# Patient Record
Sex: Female | Born: 1955 | Race: White | Hispanic: No | Marital: Married
Health system: Midwestern US, Community
[De-identification: ages and names within clinical notes are randomized; demographics above are authoritative.]

## PROBLEM LIST (undated history)

## (undated) DIAGNOSIS — F0390 Unspecified dementia without behavioral disturbance: Secondary | ICD-10-CM

## (undated) DIAGNOSIS — K219 Gastro-esophageal reflux disease without esophagitis: Secondary | ICD-10-CM

## (undated) DIAGNOSIS — R519 Headache, unspecified: Secondary | ICD-10-CM

## (undated) DIAGNOSIS — G8929 Other chronic pain: Secondary | ICD-10-CM

## (undated) DIAGNOSIS — I639 Cerebral infarction, unspecified: Secondary | ICD-10-CM

## (undated) DIAGNOSIS — F419 Anxiety disorder, unspecified: Secondary | ICD-10-CM

## (undated) DIAGNOSIS — M1990 Unspecified osteoarthritis, unspecified site: Secondary | ICD-10-CM

## (undated) DIAGNOSIS — M545 Low back pain, unspecified: Secondary | ICD-10-CM

## (undated) DIAGNOSIS — G459 Transient cerebral ischemic attack, unspecified: Secondary | ICD-10-CM

## (undated) DIAGNOSIS — F32A Depression, unspecified: Secondary | ICD-10-CM

## (undated) DIAGNOSIS — R569 Unspecified convulsions: Secondary | ICD-10-CM

## (undated) DIAGNOSIS — F329 Major depressive disorder, single episode, unspecified: Secondary | ICD-10-CM

## (undated) DIAGNOSIS — R51 Headache: Secondary | ICD-10-CM

## (undated) DIAGNOSIS — I10 Essential (primary) hypertension: Secondary | ICD-10-CM

## (undated) DIAGNOSIS — E785 Hyperlipidemia, unspecified: Secondary | ICD-10-CM

## (undated) DIAGNOSIS — F0280 Dementia in other diseases classified elsewhere without behavioral disturbance: Secondary | ICD-10-CM

## (undated) HISTORY — DX: Transient cerebral ischemic attack, unspecified: G45.9

## (undated) HISTORY — PX: VAGINAL HYSTERECTOMY: SUR661

## (undated) HISTORY — PX: TUBAL LIGATION: SHX77

## (undated) HISTORY — DX: Hyperlipidemia, unspecified: E78.5

## (undated) HISTORY — DX: Cerebral infarction, unspecified: I63.9

## (undated) HISTORY — PX: FRACTURE SURGERY: SHX138

## (undated) HISTORY — PX: DILATION AND CURETTAGE OF UTERUS: SHX78

---

## 2004-01-20 ENCOUNTER — Other Ambulatory Visit: Payer: Self-pay

## 2004-01-21 ENCOUNTER — Inpatient Hospital Stay: Payer: Self-pay

## 2004-01-25 ENCOUNTER — Other Ambulatory Visit: Payer: Self-pay

## 2004-01-25 ENCOUNTER — Emergency Department: Payer: Self-pay | Admitting: General Practice

## 2004-01-26 ENCOUNTER — Ambulatory Visit: Payer: Self-pay | Admitting: Obstetrics and Gynecology

## 2004-01-28 ENCOUNTER — Ambulatory Visit: Payer: Self-pay | Admitting: *Deleted

## 2004-06-17 ENCOUNTER — Emergency Department: Payer: Self-pay | Admitting: Emergency Medicine

## 2005-09-03 ENCOUNTER — Emergency Department: Payer: Self-pay | Admitting: Emergency Medicine

## 2006-04-28 ENCOUNTER — Emergency Department: Payer: Self-pay | Admitting: Emergency Medicine

## 2009-07-11 ENCOUNTER — Emergency Department: Payer: Self-pay | Admitting: Emergency Medicine

## 2009-12-13 ENCOUNTER — Emergency Department: Payer: Self-pay | Admitting: Unknown Physician Specialty

## 2011-07-04 ENCOUNTER — Emergency Department: Payer: Self-pay | Admitting: Emergency Medicine

## 2011-07-04 LAB — CBC
HCT: 42 % (ref 35.0–47.0)
MCHC: 33.2 g/dL (ref 32.0–36.0)

## 2011-07-04 LAB — COMPREHENSIVE METABOLIC PANEL
Albumin: 4.1 g/dL (ref 3.4–5.0)
Alkaline Phosphatase: 80 U/L (ref 50–136)
Anion Gap: 8 (ref 7–16)
Bilirubin,Total: 0.4 mg/dL (ref 0.2–1.0)
Calcium, Total: 9.3 mg/dL (ref 8.5–10.1)
Chloride: 108 mmol/L — ABNORMAL HIGH (ref 98–107)
EGFR (Non-African Amer.): >60
Glucose: 94 mg/dL (ref 65–99)
Potassium: 3.8 mmol/L (ref 3.5–5.1)
Sodium: 143 mmol/L (ref 136–145)
Total Protein: 8 g/dL (ref 6.4–8.2)

## 2011-07-04 LAB — TROPONIN I: Troponin-I: <0.02 ng/mL

## 2011-07-04 LAB — PROTIME-INR
INR: 0.9
Prothrombin Time: 12.9 secs (ref 11.5–14.7)

## 2011-07-04 LAB — URINALYSIS, COMPLETE
Bilirubin,UR: NEGATIVE
Blood: NEGATIVE
Glucose,UR: NEGATIVE mg/dL (ref 0–75)
Ketone: NEGATIVE
Protein: 30
RBC,UR: 4 /HPF (ref 0–5)
Specific Gravity: 1.032 (ref 1.003–1.030)
WBC UR: 4 /HPF (ref 0–5)

## 2011-07-04 LAB — LIPASE, BLOOD: Lipase: 128 U/L (ref 73–393)

## 2014-05-27 ENCOUNTER — Emergency Department
Admit: 2014-05-27 | Disposition: A | Discharge status: Home / Self Care | Payer: Self-pay | Admitting: Emergency Medicine

## 2014-05-27 LAB — URINALYSIS, COMPLETE
BILIRUBIN, UR: NEGATIVE
Bacteria: NONE SEEN
Blood: NEGATIVE
GLUCOSE, UR: NEGATIVE mg/dL (ref 0–75)
Ketone: NEGATIVE
NITRITE: NEGATIVE
Ph: 6 (ref 4.5–8.0)
Protein: NEGATIVE
RBC,UR: 1 /HPF (ref 0–5)
SPECIFIC GRAVITY: 1.055 (ref 1.003–1.030)
Squamous Epithelial: 1

## 2014-05-27 LAB — CBC WITH DIFFERENTIAL/PLATELET
Basophil #: 0 10*3/uL
Basophil %: 0.6 %
Eosinophil #: 0.1 10*3/uL
Eosinophil %: 1.1 %
HCT: 42.7 %
HGB: 13.8 g/dL
Lymphocyte %: 49.9 %
Lymphs Abs: 3.3 10*3/uL
MCH: 28.1 pg
MCHC: 32.2 g/dL
MCV: 87 fL
Monocyte #: 0.5 10*3/uL
Monocyte %: 7.7 %
Neutrophil #: 2.7 10*3/uL
Neutrophil %: 40.7 %
Platelet: 171 10*3/uL
RBC: 4.9 X10 6/mm 3
RDW: 15.1 % — ABNORMAL HIGH
WBC: 6.6 10*3/uL

## 2014-05-27 LAB — COMPREHENSIVE METABOLIC PANEL
ALT: 24 U/L
Albumin: 4.4 g/dL
Alkaline Phosphatase: 77 U/L
Anion Gap: 6 — ABNORMAL LOW (ref 7–16)
BUN: 18 mg/dL
Bilirubin,Total: 0.4 mg/dL
CALCIUM: 9.2 mg/dL
CHLORIDE: 108 mmol/L
Co2: 26 mmol/L
Creatinine: 0.57 mg/dL
EGFR (African American): >60
EGFR (Non-African Amer.): >60
Glucose: 107 mg/dL — ABNORMAL HIGH
POTASSIUM: 3.8 mmol/L
SGOT(AST): 22 U/L
SODIUM: 140 mmol/L
Total Protein: 7.9 g/dL

## 2014-05-27 LAB — CK TOTAL AND CKMB (NOT AT ARMC)
CK, Total: 144 U/L
CK-MB: 2.7 ng/mL

## 2014-05-27 LAB — TROPONIN I: Troponin-I: <0.03 ng/mL

## 2014-06-09 LAB — BASIC METABOLIC PANEL
Anion Gap: 14 (ref 7–16)
BUN: 31 mg/dL — AB
CALCIUM: 9.9 mg/dL
Chloride: 96 mmol/L — ABNORMAL LOW
Co2: 29 mmol/L
Creatinine: 2.44 mg/dL — ABNORMAL HIGH
EGFR (African American): 25 — ABNORMAL LOW
EGFR (Non-African Amer.): 21 — ABNORMAL LOW
Glucose: 146 mg/dL — ABNORMAL HIGH
Potassium: 3.2 mmol/L — ABNORMAL LOW
SODIUM: 139 mmol/L

## 2014-06-09 LAB — CBC WITH DIFFERENTIAL/PLATELET
Basophil #: 0.1 10*3/uL (ref 0.0–0.1)
Basophil %: 0.6 %
EOS ABS: 0 10*3/uL (ref 0.0–0.7)
Eosinophil %: 0.1 %
HCT: 42.2 % (ref 35.0–47.0)
HGB: 14.2 g/dL (ref 12.0–16.0)
LYMPHS PCT: 22.3 %
Lymphocyte #: 3.5 10*3/uL (ref 1.0–3.6)
MCH: 28.8 pg (ref 26.0–34.0)
MCHC: 33.5 g/dL (ref 32.0–36.0)
MCV: 86 fL (ref 80–100)
Monocyte #: 1.1 x10 3/mm — ABNORMAL HIGH (ref 0.2–0.9)
Monocyte %: 7.1 %
NEUTROS ABS: 10.9 10*3/uL — AB (ref 1.4–6.5)
Neutrophil %: 69.9 %
Platelet: 188 10*3/uL (ref 150–440)
RBC: 4.91 10*6/uL (ref 3.80–5.20)
RDW: 14.9 % — ABNORMAL HIGH (ref 11.5–14.5)
WBC: 15.6 10*3/uL — ABNORMAL HIGH (ref 3.6–11.0)

## 2014-06-10 ENCOUNTER — Inpatient Hospital Stay
Admit: 2014-06-10 | Disposition: A | Discharge status: Home / Self Care | Payer: Self-pay | Attending: Internal Medicine | Admitting: Internal Medicine

## 2014-06-10 LAB — TROPONIN I: Troponin-I: <0.03 ng/mL

## 2014-06-10 LAB — BASIC METABOLIC PANEL
Anion Gap: 8 (ref 7–16)
BUN: 21 mg/dL — ABNORMAL HIGH
CALCIUM: 8.8 mg/dL — AB
CO2: 27 mmol/L
Chloride: 104 mmol/L
Creatinine: 0.82 mg/dL
GLUCOSE: 100 mg/dL — AB
Potassium: 3.1 mmol/L — ABNORMAL LOW
SODIUM: 139 mmol/L

## 2014-06-10 LAB — URINALYSIS, COMPLETE
BILIRUBIN, UR: NEGATIVE
Glucose,UR: NEGATIVE mg/dL (ref 0–75)
KETONE: NEGATIVE
Nitrite: NEGATIVE
PH: 5 (ref 4.5–8.0)
PROTEIN: NEGATIVE
SPECIFIC GRAVITY: 1.005 (ref 1.003–1.030)

## 2014-06-27 NOTE — H&P (Signed)
PATIENT NAME:  Sandra Charles, FIFIELD MR#:  [id number] DATE OF BIRTH:  August 01, 1955  DATE OF ADMISSION:  06/10/2014  REFERRING PHYSICIAN:  Valli Glance. Owens Shark, MD   PRIMARY CARE PRACTITIONER:  Simeon Craft, MD, of [facility]  ADMITTING PHYSICIAN:  Juluis Mire, MD  CHIEF COMPLAINT:  Dizziness with near syncopal episode witnessed by her friend x 2.   HISTORY OF PRESENT ILLNESS:  A 59 year old African-American female with a history of hypertension and anxiety/depression disorder, who presents to the Emergency Room with the complaints of ongoing dizziness with lightheadedness. The patient states that she has been having blood pressure fluctuations for the past few weeks and for which she was seen by her primary care practitioner last Friday and was added on a new blood pressure medication, which is lisinopril/hydrochlorothiazide in place of hydrochlorothiazide. Ever since taking that medication, the patient has been not feeling well with having dizziness with some cramps and today this dizziness increased, and while she was walking within the home this evening, she felt lightheadedness and almost passed out, but did not lose consciousness, which was witnessed by her friend. The patient also stated that she checked her blood pressure prior to the event and found her blood pressure to be low with a blood pressure of 99/52. Because of this presyncopal episode, the patient was brought to the Emergency Room for further evaluation.   In the Emergency Room on arrival, the patient was noted to have a blood pressure of 107/56 and workup revealed acute kidney injury with a creatinine of 2.44 and a potassium of 3.2 and a white blood cell count elevation of 15.6, and an abnormal urinalysis suggestive of UTI. EKG was unremarkable. The patient received 1 liter of IV fluids with normal saline, following which her symptoms improved and she started feeling better. Urine was sent for cultures, and she was started on Rocephin  by the ED physician and hospitalist service was consulted for further management.   The patient denies any history of chest pain, shortness of breath, cough, or fever prior to the event. No nausea, vomiting, diarrhea, constipation, or abdominal pain. Denies any dysuria, frequency, or urgency. No focal weakness or numbness. As mentioned, the patient stated that she has been having some dizziness with lightheadedness for the past few days following change in her blood pressure medications, which was done by her PCP last Friday. The patient had an ED visit at [facility] on 05/27/2014, for blood pressure issues. At that time, her labs revealed a creatinine which was in the normal range.    PAST MEDICAL HISTORY: 1.  Hypertension.  2.  Anxiety/depression.   PAST SURGICAL HISTORY:  Hysterectomy.   ALLERGIES:  PENICILLIN.   HOME MEDICATIONS: 1.  Amitriptyline 50 mg 1 tablet orally at bedtime.  2.  Amlodipine 10 mg 1 tablet orally once a day.  3.  Atenolol 50 mg 1 tablet orally once a day.  4.  Fluoxetine 20 mg 1 capsule orally once a day.  5.  Hydrochlorothiazide 25 mg 1 tablet orally once a day.  6.  Lorazepam 0.5 mg 1 tablet orally once at bedtime.  7.  Lisinopril/hydrochlorothiazide 20/25 mg 1 tablet orally once a day.   FAMILY HISTORY:  Significant for mother and father both with hypertension, sister with heart disease, and brother with diabetes mellitus.   SOCIAL HISTORY:  She is single and lives at home. She works in the [employer]. Denies any history of smoking, alcohol, or substance abuse.  REVIEW OF SYSTEMS: CONSTITUTIONAL:  Negative for fever or chills. She does have ongoing dizziness with lightheadedness for the past few days as mentioned in the history of present illness.   EYES:  Negative for blurred vision or double vision. No pain. No redness. No discharge.  EARS, NOSE, AND THROAT:  Negative for tinnitus, ear pain, hearing loss, epistaxis, nasal  discharge, or difficulty swallowing.  RESPIRATORY:  Negative for cough, wheezing, dyspnea, hemoptysis, or painful respiration.  CARDIOVASCULAR:  Negative for chest pain, palpitations, orthopnea, dyspnea on exertion, or pedal edema. Positive for dizziness with presyncopal episodes x 2 this evening as noted in the history of present illness.  GASTROINTESTINAL:  Negative for nausea, vomiting, diarrhea, constipation, abdominal pain, hematemesis, or melena.  GENITOURINARY:  Negative for dysuria, frequency, urgency, or hematuria.  ENDOCRINE:  Negative for polyuria, nocturia, or heat or cold intolerance.  HEMATOLOGIC AND LYMPHATIC:  Negative for anemia, easy bruising or bleeding, or swollen glands.  INTEGUMENTARY:  Negative for acne, skin rash, or lesions.  MUSCULOSKELETAL:  Negative for neck or back pain. No history of arthritis or gout.  NEUROLOGICAL:  Negative for focal weakness or numbness. No history of CVA, TIA, or seizure disorder.  PSYCHIATRIC:  Positive for history of anxiety/depression, stable on home medications.   PHYSICAL EXAMINATION: VITAL SIGNS:  Temperature 98.2 degrees Fahrenheit, pulse rate 73 per minute, respirations 20 per minute, blood pressure on arrival 107/52, current blood pressure 129/61, oxygen saturation 99% on room air.  GENERAL:  Well developed, well nourished, alert, oriented, in no acute distress, comfortably resting in the bed.  HEAD:  Atraumatic, normocephalic.  EYES:  Pupils are equal and react to light and accommodation. No conjunctival pallor. No icterus. Extraocular muscles are intact.   NOSE:  No drainage.  EARS:  No drainage.  ORAL CAVITY:  No mucosal lesions.  NECK:  Supple. No JVD. No thyromegaly. No carotid bruits. Range of motion of the neck is within normal limits.  RESPIRATORY:  Good respiratory effort. Not using accessory muscles of respiration. Bilateral vesicular breath sounds. No rales or rhonchi.  CARDIOVASCULAR:  S1 and S2, regular. No murmurs,  gallops, or clicks. Pulses are equal at carotid, femoral, and pedal pulses. No peripheral edema.  GASTROINTESTINAL:  Abdomen is soft and nontender. No hepatosplenomegaly. No masses. No rigidity. No guarding. Bowel sounds are present and equal in all 4 quadrants.  GENITOURINARY:  Deferred. MUSCULOSKELETAL:  No joint tenderness or effusion. Range of motion is adequate. Strength and tone are equal bilaterally.  SKIN:  Inspection within normal limits. No obvious wounds.  LYMPHATIC:  No cervical lymphadenopathy.  VASCULAR:  Good dorsalis pedis and posterior tibial pulses.  NEUROLOGICAL:  Alert, awake, and oriented x 3. Cranial nerves II through XII are grossly intact. No sensory deficit. Motor strength is 5/5 in both upper and lower extremities. DTR's are 2+ bilaterally and symmetrical. Plantars are downgoing.  PSYCHIATRIC:  Alert, awake, and oriented x 3. Judgment and insight are adequate. Memory and mood are within normal limits.   ANCILLARY DATA:   LABORATORY DATA:  Serum glucose is 146, BUN 31, creatinine 2.44, sodium 139, potassium 3.2, chloride 96, bicarbonate 29, and total calcium is 9.9. Troponin is less than 0.03. WBC is 15.6, hemoglobin 14.2, hematocrit 42.2, and platelet count 188,000. Urinalysis:  Hazy color, 1+ blood, nitrite negative, leukocyte esterase 2+, WBCs 6 to 30, bacteria rare.   CHEST X-RAY:  No cardiomegaly. No edema, consolidation, effusion, or pneumothorax. No active cardiopulmonary disease.   EKG:  Normal sinus  rhythm with ventricular rate of 67 beats per minute and nonspecific T wave changes.   ASSESSMENT AND PLAN:  A 59 year old African-American female with a history of hypertension and anxiety/depression, who presents with dizziness and lightheadedness, found to have acute kidney injury and borderline low blood pressure and abnormal urinalysis suggestive of urinary tract infection.   1.  Postural dizziness with presyncope. Recent change in blood pressure medications by  her primary care practitioner and was found to have low blood pressure at home prior to the event. Workup reveals a normal EKG and troponin x 1 negative. The postural dizziness is likely secondary to low blood pressure secondary due to medication change. Plan:  We will admit with telemetry monitoring. We will hold off on lisinopril/hydrochlorothiazide for now. Cycle cardiac enzymes. Check orthostatic vitals and consider further workup if the patient is symptomatic.  2.  Acute kidney injury. Creatinine on the visit of 05/27/2014, was within normal limits. Creatinine now is 2.44. The patient was started on lisinopril/hydrochlorothiazide recently by her primary care practitioner. The likely cause of AKI is new medication, which is ACE inhibitor. Plan:  We will hold off on lisinopril/hydrochlorothiazide for now and IV hydration. Follow BMP. Consider further workup including nephrology consultation if no improvement.  3.  Urinary tract infection. Plan:  Continue Rocephin. Follow up urine cultures.  4.  Hypertension. The patient is on multiple medications. Discontinue lisinopril/hydrochlorothiazide because of low blood pressure and acute kidney injury. Continue amlodipine and atenolol for now and monitor blood pressure closely.  5.  Hypokalemia, mild. Potassium was replaced in the ED. Follow BMP.  6.  History of generalized anxiety disorder, stable on home medications. Continue same.  7.  Deep vein thrombosis prophylaxis. Subcutaneous heparin.  8.  Gastrointestinal prophylaxis. Proton pump inhibitor.   CODE STATUS:  Full code.   TIME SPENT:  50 minutes.    ____________________________ Juluis Mire, MD enr:nb D: 06/10/2014 02:49:47 ET T: 06/10/2014 03:30:22 ET JOB#: [id number]  cc: Juluis Mire, MD, <Dictator> Simeon Craft, MD  Juluis Mire MD ELECTRONICALLY SIGNED 06/10/2014 14:13

## 2014-06-27 NOTE — Discharge Summary (Signed)
PATIENT NAME:  Sandra Charles, Sandra Charles MR#:  [id number] DATE OF BIRTH:  06/15/1955  DATE OF ADMISSION:  06/10/2014 DATE OF DISCHARGE:  06/10/2014  DISCHARGE DIAGNOSES:  1. Urinary tract infection.  2. Hypotension secondary to antihypertensive medications.  3. Hypertension.  4. Acute renal failure.  5. Dehydration.   DISCHARGE MEDICATIONS:  1. Lorazepam 0.5 mg oral once a day.  2. Amitriptyline 50 mg oral once a day.  3. Amlodipine 10 mg oral once a day.  4. Atenolol 50 mg oral once a day.  5. Fluoxetine 20 mg daily.  6. Hydrochlorothiazide 25 mg daily.  7. Potassium chloride 20 mEq daily.   DISCHARGE INSTRUCTIONS: Low-sodium diet. Activity as tolerated. Check blood pressure daily, keep a log book, and take to your doctor's office, and follow up with PCP at [facility] in 1 week.   IMAGING STUDIES: Include a chest x-ray portable which showed nothing acute.   ADMITTING HISTORY AND PHYSICAL AND HOSPITAL COURSE: Please see detailed H and P dictated previously. In brief a 59 year old female patient with history of hypertension who was recently started on a combination of lisinopril and hydrochlorothiazide, presented to the hospital complaining of lightheadedness, presyncope, was found to have acute renal failure.   HOSPITAL COURSE:  1.  Hypotension. This was secondary to starting new blood pressure medication with lisinopril.  The patient also had acute renal failure secondary to ATN from hypotension. Also had some component of dehydration, was aggressively rehydrated with which her creatinine has trended down to 0.8 from 2.4. I have stopped her lisinopril. We will continue her beta blocker, Norvasc, and hydrochlorothiazide.  Have her follow up with her primary care physician. The patient has been given above-noted discharge.   2.  Prior to discharge the patient has ambulated in the hallway. S1, S2 heard. Lungs clear. No edema found.  3.  UTI.  The patient also had mild UTI for which she has been given  Cipro at discharge.   TIME SPENT ON DAY OF DISCHARGE IN DISCHARGE ACTIVITY: 35 minutes.     ____________________________ Leia Alf Mina Babula, MD srs:bu D: 06/11/2014 15:15:42 ET T: 06/11/2014 15:39:22 ET JOB#: [id number]  cc: Alveta Heimlich R. Wilder Kurowski, MD, <Dictator> Neita Carp MD ELECTRONICALLY SIGNED 06/21/2014 11:18

## 2014-07-19 ENCOUNTER — Emergency Department
Admission: EM | Admit: 2014-07-19 | Discharge: 2014-07-19 | Disposition: A | Discharge status: Home / Self Care | Payer: Medicaid Other | Attending: Emergency Medicine | Admitting: Emergency Medicine

## 2014-07-19 ENCOUNTER — Encounter: Payer: Self-pay | Admitting: Emergency Medicine

## 2014-07-19 DIAGNOSIS — Z87891 Personal history of nicotine dependence: Secondary | ICD-10-CM | POA: Insufficient documentation

## 2014-07-19 DIAGNOSIS — N72 Inflammatory disease of cervix uteri: Secondary | ICD-10-CM | POA: Diagnosis not present

## 2014-07-19 DIAGNOSIS — I10 Essential (primary) hypertension: Secondary | ICD-10-CM | POA: Diagnosis not present

## 2014-07-19 DIAGNOSIS — R350 Frequency of micturition: Secondary | ICD-10-CM | POA: Diagnosis present

## 2014-07-19 HISTORY — DX: Essential (primary) hypertension: I10

## 2014-07-19 LAB — WET PREP, GENITAL
CLUE CELLS WET PREP: NONE SEEN
Trich, Wet Prep: NONE SEEN
Yeast Wet Prep HPF POC: NONE SEEN

## 2014-07-19 LAB — CHLAMYDIA/NGC RT PCR (ARMC ONLY)
Chlamydia Tr: NOT DETECTED
N gonorrhoeae: NOT DETECTED

## 2014-07-19 LAB — URINALYSIS COMPLETE WITH MICROSCOPIC (ARMC ONLY)
BACTERIA UA: NONE SEEN
Bilirubin Urine: NEGATIVE
GLUCOSE, UA: NEGATIVE mg/dL
HGB URINE DIPSTICK: NEGATIVE
KETONES UR: NEGATIVE mg/dL
Leukocytes, UA: NEGATIVE
Nitrite: NEGATIVE
PH: 6 (ref 5.0–8.0)
PROTEIN: NEGATIVE mg/dL
Specific Gravity, Urine: 1.015 (ref 1.005–1.030)

## 2014-07-19 LAB — GLUCOSE, CAPILLARY: Glucose-Capillary: 84 mg/dL (ref 65–99)

## 2014-07-19 MED ORDER — LIDOCAINE HCL (PF) 1 % IJ SOLN
INTRAMUSCULAR | Status: AC
  Administered 2014-07-19: 5 mL
  Filled 2014-07-19: qty 5

## 2014-07-19 MED ORDER — AZITHROMYCIN 250 MG PO TABS
1000.0000 mg | ORAL_TABLET | Freq: Once | ORAL | Status: AC
  Administered 2014-07-19: 1000 mg via ORAL

## 2014-07-19 MED ORDER — CEFTRIAXONE SODIUM 250 MG IJ SOLR
INTRAMUSCULAR | Status: AC
  Administered 2014-07-19: 250 mg via INTRAMUSCULAR
  Filled 2014-07-19: qty 250

## 2014-07-19 MED ORDER — CEFTRIAXONE SODIUM 250 MG IJ SOLR
250.0000 mg | Freq: Once | INTRAMUSCULAR | Status: AC
  Administered 2014-07-19: 250 mg via INTRAMUSCULAR

## 2014-07-19 MED ORDER — AZITHROMYCIN 250 MG PO TABS
ORAL_TABLET | ORAL | Status: AC
  Administered 2014-07-19: 1000 mg via ORAL
  Filled 2014-07-19: qty 4

## 2014-07-19 NOTE — ED Notes (Signed)
Was on meds for uti, now still has urinary freq and vagina rawness

## 2014-07-19 NOTE — ED Provider Notes (Signed)
[facility] Emergency Department Provider Note  ____________________________________________  Time seen: 1400  I have reviewed the triage vital signs and the nursing notes.   HISTORY  Chief Complaint Urinary Frequency  HPI Sandra Charles is a 59 y.o. female this day with complaint of vaginal "rawness" she states she is having urinary frequency also. This has been going on for approximately 1 month. She denies any vaginal discharge or itching.She rates her pain as 10 out of 10. Nothing has improved her pain and she has not tried any medication for this.   Past Medical History  Diagnosis Date  . Hypertension     There are no active problems to display for this patient.   No past surgical history on file.  No current outpatient prescriptions on file.  Allergies Review of patient's allergies indicates no known allergies.  No family history on file.  Social History History  Substance Use Topics  . Smoking status: Former Research scientist (life sciences)  . Smokeless tobacco: Not on file  . Alcohol Use: No    Review of Systems Constitutional: No fever/chills Eyes: No visual changes. ENT: No sore throat. Cardiovascular: Denies chest pain. Respiratory: Denies shortness of breath. Gastrointestinal: No abdominal pain.  No nausea, no vomiting.  No diarrhea.  No constipation. Genitourinary: Positive for dysuria. Musculoskeletal: Negative for back pain. Skin: Negative for rash. Neurological: Negative for headaches, focal weakness or numbness.  10-point ROS otherwise negative.  ____________________________________________   PHYSICAL EXAM:  VITAL SIGNS: ED Triage Vitals  Enc Vitals Group     BP 07/19/14 1203 192/75 mmHg     Pulse Rate 07/19/14 1203 72     Resp 07/19/14 1203 18     Temp 07/19/14 1203 98.2 F (36.8 C)     Temp Source 07/19/14 1203 Oral     SpO2 07/19/14 1203 100 %     Weight 07/19/14 1203 155 lb (70.308 kg)     Height 07/19/14 1203 5\' 1"  (1.549 m)     Head Cir --      Peak Flow --      Pain Score 07/19/14 1204 10     Pain Loc --      Pain Edu? --      Excl. in [place]? --     Constitutional: Alert and oriented. Well appearing and in no acute distress. Eyes: Conjunctivae are normal. PERRL. EOMI. Head: Atraumatic. Neck: No stridor, supple Cardiovascular: Normal rate, regular rhythm. Grossly normal heart sounds.  Good peripheral circulation. Respiratory: Normal respiratory effort.  No retractions. Lungs CTAB. Gastrointestinal: Soft and nontender. No distention. No abdominal bruits. No CVA tenderness. Genitourinary: Pelvic exam was done. There is no abnormality to the external genitalia. There is minimal discharge noted. Cervix is slightly irritated. There is positive adnexal tenderness but no masses bilaterally.  there is some minimal cervical motion tenderness. Musculoskeletal: No lower extremity tenderness nor edema.  No joint effusions. Neurologic:  Normal speech and language. No gross focal neurologic deficits are appreciated. Speech is normal. No gait instability. Skin:  Skin is warm, dry and intact. No rash noted. Psychiatric: Mood and affect are normal. Speech and behavior are normal.  ____________________________________________   LABS (all labs ordered are listed, but only abnormal results are displayed)  Labs Reviewed  WET PREP, GENITAL - Abnormal; Notable for the following:    WBC, Wet Prep HPF POC FEW (*)    All other components within normal limits  URINALYSIS COMPLETEWITH MICROSCOPIC ([facility])  - Abnormal; Notable for the following:  Color, Urine STRAW (*)    APPearance CLEAR (*)    Squamous Epithelial / LPF 0-5 (*)    All other components within normal limits  CHLAMYDIA/NGC RT PCR ([facility])   GLUCOSE, CAPILLARY  CBG MONITORING, ED    PROCEDURES  Procedure(s) performed: None  Critical Care performed: No  ____________________________________________   INITIAL IMPRESSION / ASSESSMENT AND PLAN /  ED COURSE  Pertinent labs & imaging results that were available during my care of the patient were reviewed by me and considered in my medical decision making (see chart for details).  Patient was made aware of her wet prep. Patient received IM dose of Rocephin along with Zithromax in the emergency room. She is to abstain from sexual activity for one week. ____________________________________________   FINAL CLINICAL IMPRESSION(S) / ED DIAGNOSES  Final diagnoses:  Cervicitis, acute, non-specific      Johnn Hai, PA-C 07/19/14 1932  Lisa Roca, MD 07/21/14 [phone]

## 2014-07-19 NOTE — ED Notes (Signed)
States perineal burning and urinary frequency x 1 month, states took med for UTI, not sure if still has UTI or has vaginal irritation

## 2014-07-19 NOTE — Discharge Instructions (Signed)
Cervicitis Cervicitis is a soreness and puffiness (inflammation) of the cervix.  HOME CARE  Do not have sex (intercourse) until your doctor says it is okay.  Do not have sex until your partner is treated or as told by your doctor.  Take your antibiotic medicine as told. Finish it even if you start to feel better. GET HELP IF:   Your symptoms that brought you to the doctor come back.  You have a fever. MAKE SURE YOU:   Understand these instructions.  Will watch your condition.  Will get help right away if you are not doing well or get worse. Document Released: 11/22/2007 Document Revised: 02/17/2013 Document Reviewed: 08/06/2012 [facility] Patient Information 2015 [place], [place]. This information is not intended to replace advice given to you by your health care provider. Make sure you discuss any questions you have with your health care provider.

## 2014-09-08 ENCOUNTER — Ambulatory Visit
Admission: RE | Admit: 2014-09-08 | Discharge: 2014-09-08 | Disposition: A | Discharge status: Home / Self Care | Payer: Self-pay | Source: Ambulatory Visit | Attending: Oncology | Admitting: Oncology

## 2014-09-08 ENCOUNTER — Ambulatory Visit: Payer: Self-pay | Attending: Oncology

## 2014-09-08 VITALS — BP 153/82 | HR 78 | Temp 98.2°F | Resp 18 | Ht 62.21 in | Wt 163.8 lb

## 2014-09-08 DIAGNOSIS — Z0000 Encounter for general adult medical examination without abnormal findings: Secondary | ICD-10-CM

## 2014-09-08 NOTE — Progress Notes (Signed)
Subjective:     Patient ID: Sandra Charles, female   DOB: 27-Nov-1955, 59 y.o.   MRN: [id number]  HPI   Review of Systems     Objective:   Physical Exam  Pulmonary/Chest: Right breast exhibits no inverted nipple, no mass, no nipple discharge, no skin change and no tenderness. Left breast exhibits no inverted nipple, no mass, no nipple discharge, no skin change and no tenderness. Breasts are symmetrical.  Genitourinary: No labial fusion. There is no rash, tenderness, lesion or injury on the right labia. There is no rash, tenderness, lesion or injury on the left labia. No erythema, tenderness or bleeding in the vagina. No foreign body around the vagina. No signs of injury around the vagina. No vaginal discharge found.  Total hysterectomy       Assessment:     [place] visitPatient screened, and meets [facility] eligibility.  Patient does not have insurance, Medicare or Medicaid.  Handout given on Affordable Care Act.Patient screened, and meets BCCCP eligibility.  Patient does not have insurance, Medicare or Medicaid.  Handout given on Affordable Care Act.CBE unremarkableInstructed patient on breast self-exam using teach back method. History of total hysterectomy.  Pelvic exam normal.    Plan:     Sent for bilateral screening mammogram.

## 2014-09-09 NOTE — Progress Notes (Signed)
Letter mailed from [facility] to notify of normal mammogram results.  Patient to return in one year for annual screening.  Copy to [facility]. 

## 2014-10-04 ENCOUNTER — Encounter: Payer: Self-pay | Admitting: Emergency Medicine

## 2014-10-04 ENCOUNTER — Emergency Department
Admission: EM | Admit: 2014-10-04 | Discharge: 2014-10-04 | Disposition: A | Discharge status: Home / Self Care | Payer: Medicaid Other | Attending: Emergency Medicine | Admitting: Emergency Medicine

## 2014-10-04 ENCOUNTER — Emergency Department: Payer: Medicaid Other

## 2014-10-04 DIAGNOSIS — M25561 Pain in right knee: Secondary | ICD-10-CM | POA: Diagnosis not present

## 2014-10-04 DIAGNOSIS — M79604 Pain in right leg: Secondary | ICD-10-CM | POA: Diagnosis present

## 2014-10-04 DIAGNOSIS — Z87891 Personal history of nicotine dependence: Secondary | ICD-10-CM | POA: Diagnosis not present

## 2014-10-04 DIAGNOSIS — I10 Essential (primary) hypertension: Secondary | ICD-10-CM | POA: Insufficient documentation

## 2014-10-04 HISTORY — DX: Major depressive disorder, single episode, unspecified: F32.9

## 2014-10-04 HISTORY — DX: Depression, unspecified: F32.A

## 2014-10-04 HISTORY — DX: Anxiety disorder, unspecified: F41.9

## 2014-10-04 MED ORDER — HYDROCODONE-ACETAMINOPHEN 5-325 MG PO TABS
1.0000 | ORAL_TABLET | Freq: Once | ORAL | Status: AC
  Administered 2014-10-04: 1 via ORAL
  Filled 2014-10-04: qty 1

## 2014-10-04 MED ORDER — HYDROCODONE-ACETAMINOPHEN 5-325 MG PO TABS: ORAL_TABLET | ORAL | Status: DC

## 2014-10-04 NOTE — ED Provider Notes (Signed)
[facility] Emergency Department Provider Note  ____________________________________________  Time seen: Approximately 8:59 AM  I have reviewed the triage vital signs and the nursing notes.   HISTORY  Chief Complaint Leg Pain   HPI Sandra Charles is a 59 y.o. female right knee pain 2 weeks. She states she is unaware of any injury. She has been taking Tylenol at home without any improvement. She denies any prior problems with her knee. Today it is worse and she is unable to bear weight without severe pain. Pain is lateral, nonradiating. She denies any warmth or redness to her leg. She denies any recent traveling. Currently her pain is 10 out of 10.  Patient also has taken her blood pressure medicine today. She states that she is described clinic and they are having difficulty controlling her blood pressure and is tried multiple medications. Currently she denies any headache, chest pain, nausea, dizziness, or shortness of breath.   Past Medical History  Diagnosis Date  . Hypertension   . Anxiety   . Depression     There are no active problems to display for this patient.   History reviewed. No pertinent past surgical history.  Current Outpatient Rx  Name  Route  Sig  Dispense  Refill  . HYDROcodone-acetaminophen (NORCO/VICODIN) 5-325 MG per tablet      Take 1 tablet every 4-6 hours prn pain   20 tablet   0     Allergies Review of patient's allergies indicates no known allergies.  Family History  Problem Relation Age of Onset  . Breast cancer Mother 72  . Breast cancer Sister 18    Social History History  Substance Use Topics  . Smoking status: Former Smoker -- 1.00 packs/day for 5 years    Types: Cigarettes  . Smokeless tobacco: Not on file  . Alcohol Use: No    Review of Systems Constitutional: No fever/chills Eyes: No visual changes. ENT: No sore throat. Cardiovascular: Denies chest pain. Respiratory: Denies shortness of  breath. Gastrointestinal: No abdominal pain.  No nausea, no vomiting.  Musculoskeletal: Negative for back pain. Skin: Negative for rash. Neurological: Negative for headaches, focal weakness or numbness.  10-point ROS otherwise negative.  ____________________________________________   PHYSICAL EXAM:  VITAL SIGNS: ED Triage Vitals  Enc Vitals Group     BP 10/04/14 0843 188/90 mmHg     Pulse Rate 10/04/14 0843 82     Resp 10/04/14 0843 18     Temp 10/04/14 0843 98.9 F (37.2 C)     Temp Source 10/04/14 0843 Oral     SpO2 10/04/14 0843 100 %     Weight 10/04/14 0843 165 lb (74.844 kg)     Height 10/04/14 0843 5\' 2"  (1.575 m)     Head Cir --      Peak Flow --      Pain Score 10/04/14 0843 10     Pain Loc --      Pain Edu? --      Excl. in [place]? --     Constitutional: Alert and oriented. Well appearing and in no acute distress. Eyes: Conjunctivae are normal. PERRL. EOMI. Head: Atraumatic. Nose: No congestion/rhinnorhea. Neck: No stridor.   Cardiovascular: Normal rate, regular rhythm. Grossly normal heart sounds.  Good peripheral circulation. Respiratory: Normal respiratory effort.  No retractions. Lungs CTAB. Gastrointestinal: Soft and nontender. No distention. No abdominal bruits Musculoskeletal: Moderate tenderness on palpation of the  lateral aspect of the right knee. There is no gross  deformity noted. Range of motion is restricted secondary to pain. There is no erythema or warmth.  No joint effusions. Neurologic:  Normal speech and language. No gross focal neurologic deficits are appreciated. No gait instability. Skin:  Skin is warm, dry and intact. No rash noted. No ecchymosis or abrasions noted to the right knee. Psychiatric: Mood and affect are normal. Speech and behavior are normal.  ____________________________________________   LABS (all labs ordered are listed, but only abnormal results are displayed)  Labs Reviewed - No data to  display ____________________________________________  RADIOLOGY  Right knee x-ray per radiologist shows no acute fracture or dislocation. Minimal degenerative changes were noted. I, Johnn Hai, personally viewed and evaluated these images as part of my medical decision making.  ____________________________________________   PROCEDURES  Procedure(s) performed: None  Critical Care performed: No  ____________________________________________   INITIAL IMPRESSION / ASSESSMENT AND PLAN / ED COURSE  Pertinent labs & imaging results that were available during my care of the patient were reviewed by me and considered in my medical decision making (see chart for details).  Patient was encouraged to follow-up with [facility] clinic about her blood pressure. She is placed in a knee immobilizer for support and also Norco as needed for pain. She is also given the orthopedist on call if she should continue to have knee problems so she could make an appointment. ____________________________________________   FINAL CLINICAL IMPRESSION(S) / ED DIAGNOSES  Final diagnoses:  Acute knee pain, right      Johnn Hai, PA-C 10/04/14 1034  Delman Kitten, MD 10/04/14 [phone]

## 2014-10-04 NOTE — ED Notes (Signed)
Developed pain to  Right knee for about 2 weeks. Unknown injury.

## 2014-10-06 NOTE — Care Management (Addendum)
This RNCM received notification from ED stating that patient had called ED for financial assistance for follow up orthopedics appoint- ~$100.00. I have left a message for Londell Moh with [facility] to see if patient meets criteria for this follow up appointment. Patient did not show fracture per MD note but this could lead to the need of a joint replacement pending ortho follow up. She is listed as being self-pay. I am unsure at this time if patient meets criteria for financial assistance from our foundation. Patient states she lives with a friend- Surveyor, [employer]. He's disabled and she is on disability. She states states that social services has been helping her with her electricity. The home is owned by Mr. Trollinger. She uses Med assistance- for Rx which is mailed to her. Her PCP is at [facility]. She states she was just there in June. She also goes to [facility] for mental health. She states that [facility] has assisted with [facility] Her cell phone: [phone]. I contacted her at [phone]. She states she "has charity care at [facility] too". She states that she "has no income". She then states her "niece helps her pay the electric bill". She depends on Mr. Elvina Mattes for transportation. I have faxed notes/demographics to [facility].   10/07/14: Received callback from Londell Moh with [facility] and she stated that there's no long-term plan for this patient. 10/07/14 at 15:18: Received call from patient and someone that states she works at [employer] [facility]) stating that "patient is open to charity care at [facility] because I started it for her". She then mentioned patient being seen under charity for mammogram and PAP smear. [name] said "she would fax the paper showing charity care for this patient to [phone]". This RNCM is aware of charity care for those services but not for [facility] acute or outpatient services. I spoke with Jeanella Anton ext. [phone] states that the charity  care is through [facility] which only cover breast, female reproductive system, and cancer related.  10/07/14 1616: received fax from [facility] at [facility] [phone] (fax) from "[place] [employer]  [phone] valid 06/24/14-12/24/14". I spoke with "Joe" at [place] [employer]. This does not necessarily mean that all bills will be covered under plan but it will be considered. Joe advised that patient contact [facility] [employer] to arrange outpatient appointment with Ortho. Patient notified.

## 2014-10-14 ENCOUNTER — Emergency Department
Admission: EM | Admit: 2014-10-14 | Discharge: 2014-10-14 | Disposition: A | Discharge status: Home / Self Care | Payer: Medicaid Other | Attending: Emergency Medicine | Admitting: Emergency Medicine

## 2014-10-14 ENCOUNTER — Other Ambulatory Visit: Payer: Self-pay

## 2014-10-14 ENCOUNTER — Encounter: Payer: Self-pay | Admitting: Emergency Medicine

## 2014-10-14 ENCOUNTER — Emergency Department: Payer: Medicaid Other

## 2014-10-14 DIAGNOSIS — R0602 Shortness of breath: Secondary | ICD-10-CM | POA: Insufficient documentation

## 2014-10-14 DIAGNOSIS — R05 Cough: Secondary | ICD-10-CM | POA: Insufficient documentation

## 2014-10-14 DIAGNOSIS — I10 Essential (primary) hypertension: Secondary | ICD-10-CM | POA: Insufficient documentation

## 2014-10-14 DIAGNOSIS — R079 Chest pain, unspecified: Secondary | ICD-10-CM | POA: Insufficient documentation

## 2014-10-14 DIAGNOSIS — Z87891 Personal history of nicotine dependence: Secondary | ICD-10-CM | POA: Insufficient documentation

## 2014-10-14 LAB — BASIC METABOLIC PANEL
Anion gap: 14 (ref 5–15)
BUN: 15 mg/dL (ref 6–20)
CALCIUM: 10 mg/dL (ref 8.9–10.3)
CO2: 24 mmol/L (ref 22–32)
CREATININE: 0.75 mg/dL (ref 0.44–1.00)
Chloride: 97 mmol/L — ABNORMAL LOW (ref 101–111)
GFR calc Af Amer: >60 mL/min (ref >60)
GLUCOSE: 102 mg/dL — AB (ref 65–99)
Potassium: 3.1 mmol/L — ABNORMAL LOW (ref 3.5–5.1)
Sodium: 135 mmol/L (ref 135–145)

## 2014-10-14 LAB — TROPONIN I

## 2014-10-14 LAB — CBC
HCT: 45.1 % (ref 35.0–47.0)
Hemoglobin: 14.9 g/dL (ref 12.0–16.0)
MCH: 28.5 pg (ref 26.0–34.0)
MCHC: 33.1 g/dL (ref 32.0–36.0)
MCV: 86.1 fL (ref 80.0–100.0)
Platelets: 197 10*3/uL (ref 150–440)
RBC: 5.23 MIL/uL — ABNORMAL HIGH (ref 3.80–5.20)
RDW: 14.8 % — AB (ref 11.5–14.5)
WBC: 6.1 10*3/uL (ref 3.6–11.0)

## 2014-10-14 LAB — FIBRIN DERIVATIVES D-DIMER (ARMC ONLY): FIBRIN DERIVATIVES D-DIMER (ARMC): 351.06 (ref 0–499)

## 2014-10-14 MED ORDER — LORAZEPAM 1 MG PO TABS
1.0000 mg | ORAL_TABLET | Freq: Once | ORAL | Status: AC
  Administered 2014-10-14: 1 mg via ORAL
  Filled 2014-10-14: qty 1

## 2014-10-14 MED ORDER — RANITIDINE HCL 150 MG PO TABS: 150.0000 mg | ORAL_TABLET | Freq: Two times a day (BID) | ORAL | Status: DC

## 2014-10-14 MED ORDER — IBUPROFEN 600 MG PO TABS
600.0000 mg | ORAL_TABLET | Freq: Once | ORAL | Status: AC
  Administered 2014-10-14: 600 mg via ORAL
  Filled 2014-10-14: qty 1

## 2014-10-14 MED ORDER — POTASSIUM CHLORIDE CRYS ER 20 MEQ PO TBCR
40.0000 meq | EXTENDED_RELEASE_TABLET | Freq: Once | ORAL | Status: AC
  Administered 2014-10-14: 40 meq via ORAL
  Filled 2014-10-14: qty 2

## 2014-10-14 NOTE — ED Notes (Signed)
Pt reports 10/10 generalized cramping felt throughout her body.

## 2014-10-14 NOTE — ED Provider Notes (Signed)
[facility] Emergency Department Provider Note  ____________________________________________  Time seen: 1520  I have reviewed the triage vital signs and the nursing notes.   HISTORY  Chief Complaint Chest Pain and Cough   History limited by: Not Limited   HPI Sandra Charles is a 59 y.o. female with history of hypertension who presents to the emergency department today with complaints of chest pain. She states that the chest pain started yesterday. She describes it as sharp. It is located in the left chest and radiates towards the central chest. She denies any radiation into her jaw or shoulder. She states that the pain is worse when she lies flat. She has intermittent shortness of breath.She has had a slight nonproductive cough past couple of days. She has not tried taking any pain medication. Denies any fevers.     Past Medical History  Diagnosis Date  . Hypertension   . Anxiety   . Depression     There are no active problems to display for this patient.   History reviewed. No pertinent past surgical history.  Current Outpatient Rx  Name  Route  Sig  Dispense  Refill  . HYDROcodone-acetaminophen (NORCO/VICODIN) 5-325 MG per tablet      Take 1 tablet every 4-6 hours prn pain   20 tablet   0     Allergies Review of patient's allergies indicates no known allergies.  Family History  Problem Relation Age of Onset  . Breast cancer Mother 71  . Breast cancer Sister 26    Social History Social History  Substance Use Topics  . Smoking status: Former Smoker -- 1.00 packs/day for 5 years    Types: Cigarettes  . Smokeless tobacco: None  . Alcohol Use: No    Review of Systems  Constitutional: Negative for fever. Cardiovascular: Positive for chest pain Respiratory: Positive for cough. Positive for intermittent shortness of breath. Gastrointestinal: Negative for abdominal pain, vomiting and diarrhea. Genitourinary: Negative for  dysuria. Musculoskeletal: Negative for back pain. Skin: Negative for rash. Neurological: Negative for headaches, focal weakness or numbness.   10-point ROS otherwise negative.  ____________________________________________   PHYSICAL EXAM:  VITAL SIGNS: ED Triage Vitals  Enc Vitals Group     BP 10/14/14 1016 204/98 mmHg     Pulse Rate 10/14/14 1016 106     Resp 10/14/14 1016 18     Temp 10/14/14 1016 98 F (36.7 C)     Temp Source 10/14/14 1016 Oral     SpO2 10/14/14 1016 100 %     Weight 10/14/14 1016 155 lb (70.308 kg)     Height 10/14/14 1016 5\' 1"  (1.549 m)     Head Cir --      Peak Flow --      Pain Score 10/14/14 1016 9   Constitutional: Alert and oriented. Well appearing and in no distress. Eyes: Conjunctivae are normal. PERRL. Normal extraocular movements. ENT   Head: Normocephalic and atraumatic.   Nose: No congestion/rhinnorhea.   Mouth/Throat: Mucous membranes are moist.   Neck: No stridor. Hematological/Lymphatic/Immunilogical: No cervical lymphadenopathy. Cardiovascular: Normal rate, regular rhythm.  No murmurs, rubs, or gallops. Respiratory: Normal respiratory effort without tachypnea nor retractions. Breath sounds are clear and equal bilaterally. No wheezes/rales/rhonchi. Gastrointestinal: Soft and nontender. No distention. There is no CVA tenderness. Genitourinary: Deferred Musculoskeletal: Normal range of motion in all extremities. No joint effusions.  No lower extremity tenderness nor edema. Neurologic:  Normal speech and language. No gross focal neurologic deficits are  appreciated. Speech is normal.  Skin:  Skin is warm, dry and intact. No rash noted. Psychiatric: Mood and affect are normal. Speech and behavior are normal. Patient exhibits appropriate insight and judgment.  ____________________________________________    LABS (pertinent positives/negatives)  Labs Reviewed  BASIC METABOLIC PANEL - Abnormal; Notable for the following:     Potassium 3.1 (*)    Chloride 97 (*)    Glucose, Bld 102 (*)    All other components within normal limits  CBC - Abnormal; Notable for the following:    RBC 5.23 (*)    RDW 14.8 (*)    All other components within normal limits  TROPONIN I     ____________________________________________   EKG  I, Nance Pear, attending physician, personally viewed and interpreted this EKG  EKG Time: 1026 Rate: 96 Rhythm: NSR Axis: normal Intervals: qtc 399 QRS: narrow ST changes: no st elevation  ____________________________________________    RADIOLOGY  CXR IMPRESSION: No active cardiopulmonary disease.  ____________________________________________   PROCEDURES  Procedure(s) performed: None  Critical Care performed: No  ____________________________________________   INITIAL IMPRESSION / ASSESSMENT AND PLAN / ED COURSE  Pertinent labs & imaging results that were available during my care of the patient were reviewed by me and considered in my medical decision making (see chart for details).  Patient presented to the emergency department today with complaints of chest pain and high blood pressure. Patient did feel better after medications. Workup showed negative troponin. The do not think the patient's pain represents ACS given negative troponin and symptoms that started yesterday. In addition d-dimer was within normal limits. I doubt PE. Chest x-ray without any concerning findings I doubt pneumothorax or pneumonia. Given the patient did feel better after medications wonder if patient has inflammation. Additionally patient could have GERD. I did discuss this with patient and family. Will plan on discharging home to follow-up with primary care doctor. Furthermore the patient was instructed to take her blood pressure medications when she got home. It appears the patient has not taken her full blood pressure medication regimen  today.  ____________________________________________   FINAL CLINICAL IMPRESSION(S) / ED DIAGNOSES  Final diagnoses:  Chest pain, unspecified chest pain type     Nance Pear, MD 10/14/14 1751

## 2014-10-14 NOTE — ED Notes (Signed)
x1 day of chest pain , under breast line , cramping, tightness , no nausea , radiating around to her back

## 2014-10-14 NOTE — Discharge Instructions (Signed)
Please seek medical attention for any high fevers, chest pain, shortness of breath, change in behavior, persistent vomiting, bloody stool or any other new or concerning symptoms. ° °Chest Pain (Nonspecific) °It is often hard to give a specific diagnosis for the cause of chest pain. There is always a chance that your pain could be related to something serious, such as a heart attack or a blood clot in the lungs. You need to follow up with your health care provider for further evaluation. °CAUSES  °· Heartburn. °· Pneumonia or bronchitis. °· Anxiety or stress. °· Inflammation around your heart (pericarditis) or lung (pleuritis or pleurisy). °· A blood clot in the lung. °· A collapsed lung (pneumothorax). It can develop suddenly on its own (spontaneous pneumothorax) or from trauma to the chest. °· Shingles infection (herpes zoster virus). °The chest wall is composed of bones, muscles, and cartilage. Any of these can be the source of the pain. °· The bones can be bruised by injury. °· The muscles or cartilage can be strained by coughing or overwork. °· The cartilage can be affected by inflammation and become sore (costochondritis). °DIAGNOSIS  °Lab tests or other studies may be needed to find the cause of your pain. Your health care provider may have you take a test called an ambulatory electrocardiogram (ECG). An ECG records your heartbeat patterns over a 24-hour period. You may also have other tests, such as: °· Transthoracic echocardiogram (TTE). During echocardiography, sound waves are used to evaluate how blood flows through your heart. °· Transesophageal echocardiogram (TEE). °· Cardiac monitoring. This allows your health care provider to monitor your heart rate and rhythm in real time. °· Holter monitor. This is a portable device that records your heartbeat and can help diagnose heart arrhythmias. It allows your health care provider to track your heart activity for several days, if needed. °· Stress tests by  exercise or by giving medicine that makes the heart beat faster. °TREATMENT  °· Treatment depends on what may be causing your chest pain. Treatment may include: °¨ Acid blockers for heartburn. °¨ Anti-inflammatory medicine. °¨ Pain medicine for inflammatory conditions. °¨ Antibiotics if an infection is present. °· You may be advised to change lifestyle habits. This includes stopping smoking and avoiding alcohol, caffeine, and chocolate. °· You may be advised to keep your head raised (elevated) when sleeping. This reduces the chance of acid going backward from your stomach into your esophagus. °Most of the time, nonspecific chest pain will improve within 2-3 days with rest and mild pain medicine.  °HOME CARE INSTRUCTIONS  °· If antibiotics were prescribed, take them as directed. Finish them even if you start to feel better. °· For the next few days, avoid physical activities that bring on chest pain. Continue physical activities as directed. °· Do not use any tobacco products, including cigarettes, chewing tobacco, or electronic cigarettes. °· Avoid drinking alcohol. °· Only take medicine as directed by your health care provider. °· Follow your health care provider's suggestions for further testing if your chest pain does not go away. °· Keep any follow-up appointments you made. If you do not go to an appointment, you could develop lasting (chronic) problems with pain. If there is any problem keeping an appointment, call to reschedule. °SEEK MEDICAL CARE IF:  °· Your chest pain does not go away, even after treatment. °· You have a rash with blisters on your chest. °· You have a fever. °SEEK IMMEDIATE MEDICAL CARE IF:  °· You have increased chest   pain or pain that spreads to your arm, neck, jaw, back, or abdomen. °· You have shortness of breath. °· You have an increasing cough, or you cough up blood. °· You have severe back or abdominal pain. °· You feel nauseous or vomit. °· You have severe weakness. °· You  faint. °· You have chills. °This is an emergency. Do not wait to see if the pain will go away. Get medical help at once. Call your local emergency services (911 in [place] Do not drive yourself to the hospital. °MAKE SURE YOU:  °· Understand these instructions. °· Will watch your condition. °· Will get help right away if you are not doing well or get worse. °Document Released: 11/22/2004 Document Revised: 02/17/2013 Document Reviewed: 09/18/2007 °Exit[employer]® Patient Information ©2015 [employer]. This information is not intended to replace advice given to you by your health care provider. Make sure you discuss any questions you have with your health care provider. ° °

## 2014-10-14 NOTE — ED Notes (Signed)
Patient to ED with report of chest pain and cramping to rib cage that started yesterday, reports cough since Sunday.

## 2014-10-18 ENCOUNTER — Ambulatory Visit (INDEPENDENT_AMBULATORY_CARE_PROVIDER_SITE_OTHER): Payer: Self-pay | Admitting: Sports Medicine

## 2014-10-18 ENCOUNTER — Encounter: Payer: Self-pay | Admitting: Sports Medicine

## 2014-10-18 VITALS — BP 202/87 | Ht 61.0 in | Wt 155.0 lb

## 2014-10-18 DIAGNOSIS — M25561 Pain in right knee: Secondary | ICD-10-CM

## 2014-10-18 MED ORDER — TRAMADOL HCL 50 MG PO TABS: 50.0000 mg | ORAL_TABLET | Freq: Three times a day (TID) | ORAL | Status: DC | PRN

## 2014-10-18 MED ORDER — DIAZEPAM 5 MG PO TABS: ORAL_TABLET | ORAL | Status: DC

## 2014-10-18 NOTE — Progress Notes (Signed)
Patient ID: Sandra Charles, female   DOB: October 20, 1955, 58 y.o.   MRN: [id number]  CC: R knee pain  HPI:  59 year old lady with PMH of uncontrolled HTN here for ED f/u of R knee pain Notes it started about 1-2 mos ago No falls or trigger events that she recalls Gradually worsened Presented to ED 2 weeks ago, XR negative for acute fracture Was told she had arthitis, given Norco, and d/c home Continues to worsen Pain is nagging and aching with a grinding feeling sometimes when she walks Sometimes "cramps" and radiates down leg to level of foot Walking or straightening it makes it worse Sitting makes it better Massage, heat, and norco helped a little Notes that sometimes it will swell off an on, not swollen today  Review of Systems  HENT: Negative for tinnitus.   Eyes: Negative for blurred vision and double vision.  Respiratory: Negative for shortness of breath.   Cardiovascular: Negative for chest pain, palpitations and leg swelling.  Musculoskeletal: Positive for joint pain.  Neurological: Negative for dizziness, sensory change, focal weakness and headaches.     BP 202/87 mmHg  Ht 5\' 1"  (1.549 m)  Wt 155 lb (70.308 kg)  BMI 29.30 kg/m2 Physical Exam  Constitutional: She is oriented to person, place, and time. No distress.  HENT:  Head: Normocephalic and atraumatic.  Right Ear: External ear normal.  Left Ear: External ear normal.  Nose: Nose normal.  Eyes: EOM are normal.  Neck: Normal range of motion. Neck supple.  Cardiovascular:  Appears well perfused.   Pulmonary/Chest: Effort normal.  Musculoskeletal: She exhibits tenderness. She exhibits no edema.  Knee exam:  Normal appearing knees b/l without swelling or erythema Tenderness to palpation on fibular head. No ttp on lateral joint line, patella, medial jt line, IT band Normal PROM Active ROM: patient able to extend to near 135* only, normal flexion Gait: antalgic, pt barely able to bear weight Lachmans and anterior  drawer: No laxity Mc[place]ray: limited due to pt cooperation, painful but no pop or click Varus stress: pain Valgus stress: no pain    Neurological: She is alert and oriented to person, place, and time. No cranial nerve deficit.  Skin: Skin is warm and dry. No rash noted. She is not diaphoretic. No erythema.  Psychiatric: Affect normal.   Assessment:  59 year old lady with PMH of uncontrolled HTN here for ED f/u of worsening R knee pain. Pt with XR at ED without significant arthritic changes. On exam, pt not able to extend knee fully and has significant pain with palpating lateral knee, specifically over fibular head. DDX includes arthritis, meniscal tear, bursitis, stress fracture.   Noted that pt's blood pressure today was uncontrolled. She states that it is "always high" and she is working closely with her PCP to get it under control. She has taken her am BP meds today. Pt denies concerning symptoms so will recommend that she avoid salty foods, continue compliance with BP meds, and follow up closely with her PCP.   PLAN:  Will get MRI to more specifically evaluate knee ligaments, cartilage, and bones.  Will write RX for tramadol 50mg  TID for 60 pills.  Counseled pt on proper use of cane and recommended ambulating with cane at all times to ensure safety.  Recommended calling PCP or reporting to ED if pt experiences any concerning symptoms for hypertensive emergency such as HA, blurred vision, CP, SOB, weakness or sensory change. Will call pt with further recommendations  regarding her knee pending MRI results.   Waynard Reeds MD This pt seen and evaluated with Dr. Micheline Chapman.  Patient seen and evaluated with the above-named resident. I agree with the plan of care. X-rays are unremarkable. MRI of the right knee to better evaluate soft tissue pathology and occult bony injury. Tramadol as needed for pain. Phone follow-up after the MRI to discuss the results and delineate further treatment. We also  discussed the importance of follow-up with her PCP for her severe hypertension. She understands.

## 2014-10-28 ENCOUNTER — Ambulatory Visit (HOSPITAL_COMMUNITY)
Admission: RE | Admit: 2014-10-28 | Discharge: 2014-10-28 | Disposition: A | Discharge status: Home / Self Care | Payer: Medicaid Other | Source: Ambulatory Visit | Attending: Sports Medicine | Admitting: Sports Medicine

## 2014-10-28 DIAGNOSIS — R937 Abnormal findings on diagnostic imaging of other parts of musculoskeletal system: Secondary | ICD-10-CM | POA: Insufficient documentation

## 2014-10-28 DIAGNOSIS — M25561 Pain in right knee: Secondary | ICD-10-CM

## 2014-10-28 DIAGNOSIS — M7121 Synovial cyst of popliteal space [Baker], right knee: Secondary | ICD-10-CM | POA: Insufficient documentation

## 2014-10-28 DIAGNOSIS — M25761 Osteophyte, right knee: Secondary | ICD-10-CM | POA: Diagnosis not present

## 2014-11-02 ENCOUNTER — Other Ambulatory Visit: Payer: Self-pay

## 2014-11-04 ENCOUNTER — Telehealth: Payer: Self-pay | Admitting: Sports Medicine

## 2014-11-04 NOTE — Telephone Encounter (Signed)
I spoke with the patient on the phone today after reviewing the MRI of her right knee. Dominant finding is degenerative changes throughout the knee. She has degenerative changes in both the medial and lateral menisci. Also some degenerative changes involving the medial femoral condyle. No joint effusion. No loose bodies. No meniscal tear. I recommend a follow-up appointment to discuss an intra-articular cortisone injection to be coupled with physical therapy. She will follow-up at her convenience.

## 2014-11-08 ENCOUNTER — Ambulatory Visit: Payer: Self-pay | Admitting: Sports Medicine

## 2014-11-08 ENCOUNTER — Encounter (INDEPENDENT_AMBULATORY_CARE_PROVIDER_SITE_OTHER): Payer: Self-pay

## 2014-11-08 ENCOUNTER — Ambulatory Visit: Payer: Self-pay

## 2014-11-09 ENCOUNTER — Encounter: Payer: Self-pay | Admitting: Sports Medicine

## 2014-11-09 ENCOUNTER — Ambulatory Visit (INDEPENDENT_AMBULATORY_CARE_PROVIDER_SITE_OTHER): Payer: Self-pay | Admitting: Sports Medicine

## 2014-11-09 VITALS — BP 192/79 | Ht 62.0 in | Wt 160.0 lb

## 2014-11-09 DIAGNOSIS — M25561 Pain in right knee: Secondary | ICD-10-CM

## 2014-11-09 DIAGNOSIS — M1711 Unilateral primary osteoarthritis, right knee: Secondary | ICD-10-CM

## 2014-11-09 MED ORDER — METHYLPREDNISOLONE ACETATE 40 MG/ML IJ SUSP
40.0000 mg | Freq: Once | INTRAMUSCULAR | Status: AC
  Administered 2014-11-09: 40 mg via INTRA_ARTICULAR

## 2014-11-09 NOTE — Progress Notes (Signed)
   Subjective:    Patient ID: Sandra Charles, female    DOB: [birth year], 57 y.o.   MRN: [id number]  HPI   Patient comes in at my request for an intra-articular cortisone injection into her right knee. A recent MRI of her right knee showed some degenerative changes in both the medial and lateral menisci. She also has some degenerative changes in the medial femoral condyle. No full-thickness meniscal tear was seen. No loose bodies. No significant effusion. Her pain is primarily along the lateral knee. She does get intermittent swelling.    Review of Systems     Objective:   Physical Exam Well-developed, well-nourished. No acute distress  Right knee: Full range of motion. Trace effusion. She is tender to palpation along the lateral joint line but a negative [employer]. Minimal tenderness along the medial joint line. Neurovascularly intact distally.  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 An anterior lateral approach was utilized. Patient tolerates this without difficulty. She will follow-up in 4 weeks for reevaluation. If symptoms persist then I would consider referral to orthopedics for possible arthroscopy. If symptoms improve I will educate her on a home exercise program. Call with questions or concerns in the interim.  Consent obtained and verified. Time-out conducted. Noted no overlying erythema, induration, or other signs of local infection. Skin prepped in a sterile fashion. Topical analgesic spray: Ethyl chloride. Joint: right knee Needle: 25g 1.5 inch Completed without difficulty. Meds: 3cc 1% xylocaine, 1cc depomedrol  Advised to call if fevers/chills, erythema, induration, drainage, or persistent bleeding.

## 2014-11-16 ENCOUNTER — Encounter (INDEPENDENT_AMBULATORY_CARE_PROVIDER_SITE_OTHER): Payer: Self-pay

## 2014-11-16 ENCOUNTER — Encounter: Payer: Self-pay | Admitting: Pharmacist

## 2014-11-25 ENCOUNTER — Encounter: Payer: Self-pay | Admitting: Cardiovascular Disease

## 2014-11-25 ENCOUNTER — Ambulatory Visit (INDEPENDENT_AMBULATORY_CARE_PROVIDER_SITE_OTHER): Payer: Self-pay | Admitting: Cardiovascular Disease

## 2014-11-25 VITALS — BP 150/80 | HR 72 | Ht 62.0 in | Wt 160.8 lb

## 2014-11-25 DIAGNOSIS — R0789 Other chest pain: Secondary | ICD-10-CM | POA: Insufficient documentation

## 2014-11-25 DIAGNOSIS — I10 Essential (primary) hypertension: Secondary | ICD-10-CM

## 2014-11-25 DIAGNOSIS — K219 Gastro-esophageal reflux disease without esophagitis: Secondary | ICD-10-CM

## 2014-11-25 DIAGNOSIS — R079 Chest pain, unspecified: Secondary | ICD-10-CM

## 2014-11-25 MED ORDER — PANTOPRAZOLE SODIUM 40 MG PO TBEC: 40.0000 mg | DELAYED_RELEASE_TABLET | Freq: Every day | ORAL | Status: AC

## 2014-11-25 MED ORDER — CARVEDILOL 12.5 MG PO TABS: 12.5000 mg | ORAL_TABLET | Freq: Two times a day (BID) | ORAL | Status: DC

## 2014-11-25 NOTE — Assessment & Plan Note (Signed)
Her blood pressure has not been controlled. Thus, I elected to change atenolol to carvedilol for better control.

## 2014-11-25 NOTE — Assessment & Plan Note (Signed)
I suspect that a lot of her symptoms are related to this. Thus, I added a PPI.

## 2014-11-25 NOTE — Progress Notes (Signed)
HPI   this is a 59 year old African-American female who was referred from the emergency room at [facility] for evaluation of chest pain. She has no previous cardiac history. She has known history of hypertension , hyperlipidemia , GERD and obesity. She also reports significant right knee arthritis which might require surgery. She is not a smoker and does not drink alcohol. She has family history of coronary artery disease and strokes. She reports intermittent chest discomfort over the last 6 months described as cramping in the central and left side of the chest both at rest and with physical activities. This usually last for 5-10 minutes but was more intense in August and thus she went to the emergency room at [facility]. Basic workup was negative including normal d-dimer and troponin. EKG showed no acute changes. She describes a lot of heartburn symptoms as well with an acid  Taste in her mouth. She reports that her blood pressure has not been well-controlled.  No Known Allergies   Current Outpatient Prescriptions on File Prior to Visit  Medication Sig Dispense Refill  . amLODipine (NORVASC) 10 MG tablet Take 10 mg by mouth daily. for high blood pressure  1  . ranitidine (ZANTAC) 150 MG tablet Take 1 tablet (150 mg total) by mouth 2 (two) times daily. 60 tablet 0  . traMADol (ULTRAM) 50 MG tablet Take 1 tablet (50 mg total) by mouth every 8 (eight) hours as needed. 60 tablet 0   No current facility-administered medications on file prior to visit.     Past Medical History  Diagnosis Date  . Hypertension   . Anxiety   . Depression   . Mini stroke   . Hyperlipidemia      Past Surgical History  Procedure Laterality Date  . Vaginal hysterectomy       Family History  Problem Relation Age of Onset  . Hyperlipidemia Mother   . Breast cancer Mother 68  . Hypertension Mother   . Heart attack Mother     age 24's  . Breast cancer Sister 22  . Hyperlipidemia Father   . Hypertension Father   .  Heart attack Father 8     Social History   Social History  . Marital Status: Single    Spouse Name: N/A  . Number of Children: N/A  . Years of Education: N/A   Occupational History  . Not on file.   Social History Main Topics  . Smoking status: Former Smoker -- 1.00 packs/day for 5 years    Types: Cigarettes  . Smokeless tobacco: Not on file  . Alcohol Use: No  . Drug Use: No  . Sexual Activity: Not on file   Other Topics Concern  . Not on file   Social History Narrative     ROS A 10 point review of system was performed. It is negative other than that mentioned in the history of present illness.   PHYSICAL EXAM   BP 150/80 mmHg  Pulse 72  Ht 5\' 2"  (1.575 m)  Wt 160 lb 12 oz (72.916 kg)  BMI 29.39 kg/m2 Constitutional: She is oriented to person, place, and time. She appears well-developed and well-nourished. No distress.  HENT: No nasal discharge.  Head: Normocephalic and atraumatic.  Eyes: Pupils are equal and round. No discharge.  Neck: Normal range of motion. Neck supple. No JVD present. No thyromegaly present.  Cardiovascular: Normal rate, regular rhythm, normal heart sounds. Exam reveals no gallop and no friction rub. No murmur heard.  Pulmonary/Chest: Effort normal and breath sounds normal. No stridor. No respiratory distress. She has no wheezes. She has no rales. She exhibits no tenderness.  Abdominal: Soft. Bowel sounds are normal. She exhibits no distension. There is no tenderness. There is no rebound and no guarding.  Musculoskeletal: Normal range of motion. She exhibits no edema and no tenderness.  Neurological: She is alert and oriented to person, place, and time. Coordination normal.  Skin: Skin is warm and dry. No rash noted. She is not diaphoretic. No erythema. No pallor.  Psychiatric: She has a normal mood and affect. Her behavior is normal. Judgment and thought content normal.     EKG: Normal sinus rhythm with first-degree AV block and  nonspecific T wave changes.   ASSESSMENT AND PLAN

## 2014-11-25 NOTE — Assessment & Plan Note (Signed)
The chest pain is overall atypical that she has multiple risk factors for coronary artery disease unless I recommend evaluation with a pharmacologic nuclear stress test. She is not able to exercise on a treadmill due to right knee arthritis.

## 2014-11-25 NOTE — Patient Instructions (Addendum)
Medication Instructions:  Your physician has recommended you make the following change in your medication:  STOP taking atenolol START taking coreg 12.5mg  twice per day START taking protonix 40mg  once per day   Labwork: none  Testing/Procedures: Your physician has requested that you have a lexiscan myoview. For further information please visit [email or web address]. Please follow instruction sheet, as given.  [place]  Your caregiver has ordered a Stress Test with nuclear imaging. The purpose of this test is to evaluate the blood supply to your heart muscle. This procedure is referred to as a "Non-Invasive Stress Test." This is because other than having an IV started in your vein, nothing is inserted or "invades" your body. Cardiac stress tests are done to find areas of poor blood flow to the heart by determining the extent of coronary artery disease (CAD). Some patients exercise on a treadmill, which naturally increases the blood flow to your heart, while others who are  unable to walk on a treadmill due to physical limitations have a pharmacologic/chemical stress agent called Lexiscan . This medicine will mimic walking on a treadmill by temporarily increasing your coronary blood flow.   Please note: these test may take anywhere between 2-4 hours to complete  PLEASE REPORT TO [place] AT THE FIRST DESK WILL DIRECT YOU WHERE TO GO  Date of Procedure: Friday, Oct 7, 7:30am  Arrival Time for Procedure: 7:00am  Instructions regarding medication:     _xx___:  Hold coreg night before procedure and morning of procedure  ___xx_:  Hold other medications as follows:______diuretic for comfort  ___________________________________________________________________________________________________________________________________________________________________________________________________________________________________________________________________________________  PLEASE NOTIFY THE OFFICE AT LEAST 24 HOURS IN ADVANCE IF YOU ARE UNABLE TO [place].  [phone] AND  PLEASE NOTIFY NUCLEAR MEDICINE AT [facility] AT LEAST 24 HOURS IN ADVANCE IF YOU ARE UNABLE TO KEEP YOUR APPOINTMENT. [phone]  How to prepare for your Myoview test:   Do not eat or drink after midnight  No caffeine for 24 hours prior to test  No smoking 24 hours prior to test.  Your medication may be taken with water.  If your doctor stopped a medication because of this test, do not take that medication.  Ladies, please do not wear dresses.  Skirts or pants are appropriate. Please wear a short sleeve shirt.  No perfume, cologne or lotion.  Wear comfortable walking shoes. No heels!            Follow-Up: Your physician recommends that you schedule a follow-up appointment with Dr. Fletcher Anon as needed.    Any Other Special Instructions Will Be Listed Below (If Applicable).  Nuclear Medicine Exam A nuclear medicine exam is a safe and painless imaging test. It helps to detect and diagnose disease in the body as well as provide information about organ function and structure.  Nuclear scans are most often done of the:  Lungs.  Heart.  Thyroid gland.  Bones.  Abdomen. HOW A NUCLEAR MEDICINE EXAM WORKS A nuclear medicine exam works by using a radioactive tracer. The material is given either by an IV (intravenous) injection or it may be swallowed. After the tracer is in the body, it is absorbed by your body's organs. A large scanning machine that uses a special camera detects the radioactivity in your body. A computerized image is then formed regarding the area of concern. The small amounts of  radioactive material used in a nuclear medicine exam are found to be medically safe. However, because radioactive material is used,  this test is not done if you are pregnant or nursing.  BEFORE THE PROCEDURE  If available, bring previous imaging studies such as x-rays, etc. with you to the exam.  Arrive early for your exam. PROCEDURE  An IV may be started before the exam begins.  Depending on the type of examination, will lie on a table or sit in a chair during the exam.  The nuclear medicine exam will take about 30 to 60 minutes to complete. AFTER THE PROCEDURE  After your scan is completed, the image(s) will be evaluated by a specialist. It is important that you follow up with your caregiver to find out your test results.  You may return to your regular activity as instructed by your caregiver. SEEK IMMEDIATE MEDICAL CARE IF: You have shortness of breath or difficulty breathing. MAKE SURE YOU:   Understand these instructions.  Will watch your condition.  Will get help right away if you are not doing well or get worse. Document Released: 03/22/2004 Document Revised: 05/07/2011 Document Reviewed: 05/06/2008 [facility] Patient Information 2015 [place], [place]. This information is not intended to replace advice given to you by your health care provider. Make sure you discuss any questions you have with your health care provider.

## 2014-11-26 ENCOUNTER — Telehealth: Payer: Self-pay | Admitting: *Deleted

## 2014-11-26 NOTE — Telephone Encounter (Signed)
Received records request Disability Determination Services , forwarded to [facility] for processing on 11/26/14.

## 2014-12-03 ENCOUNTER — Telehealth: Payer: Self-pay | Admitting: Cardiovascular Disease

## 2014-12-03 ENCOUNTER — Ambulatory Visit
Admission: RE | Admit: 2014-12-03 | Discharge: 2014-12-03 | Disposition: A | Discharge status: Home / Self Care | Payer: Self-pay | Source: Ambulatory Visit | Attending: Cardiovascular Disease | Admitting: Cardiovascular Disease

## 2014-12-03 DIAGNOSIS — R079 Chest pain, unspecified: Secondary | ICD-10-CM

## 2014-12-03 MED ORDER — TECHNETIUM TC 99M SESTAMIBI - CARDIOLITE: 30.0000 | Freq: Once | INTRAVENOUS | Status: DC | PRN

## 2014-12-03 MED ORDER — TECHNETIUM TC 99M SESTAMIBI - CARDIOLITE: 13.0000 | Freq: Once | INTRAVENOUS | Status: DC | PRN

## 2014-12-03 MED ORDER — REGADENOSON 0.4 MG/5ML IV SOLN
0.4000 mg | Freq: Once | INTRAVENOUS | Status: DC
  Filled 2014-12-03: qty 5

## 2014-12-03 NOTE — Telephone Encounter (Signed)
Patient needs something for anxiety prior to rescheduled nm procedure.   Patient tried to do the one in machine and could not tolerate .  Please call patient to let her know.     If needing to call in med management clinic.  [phone].  Fax [phone]

## 2014-12-06 NOTE — Telephone Encounter (Signed)
Pt requests something for anxiety prior to Wed. lexi scan. Forward to [employer]

## 2014-12-07 ENCOUNTER — Other Ambulatory Visit: Payer: Self-pay

## 2014-12-07 ENCOUNTER — Telehealth: Payer: Self-pay | Admitting: Cardiovascular Disease

## 2014-12-07 MED ORDER — ALPRAZOLAM 0.25 MG PO TABS: 0.2500 mg | ORAL_TABLET | Freq: Every evening | ORAL | Status: DC | PRN

## 2014-12-07 NOTE — Telephone Encounter (Signed)
Clarisse Gouge at 12/07/2014 9:25 AM             Patient niece calling to check status of anti anxiety rx for nm study tomorrow. Please call to discuss.

## 2014-12-07 NOTE — Telephone Encounter (Signed)
Patient niece calling to check status of anti anxiety rx for nm study tomorrow.  Please call to discuss.

## 2014-12-07 NOTE — Telephone Encounter (Signed)
Please see previous phone note.  

## 2014-12-07 NOTE — Telephone Encounter (Signed)
S/w pt of Chris's recommendations. Pt requests xanax be sent to [facility] on [address], [facility]  Prescription sent

## 2014-12-07 NOTE — Telephone Encounter (Signed)
She may have xanax 0.25 mg po x 1, #1, zero refills, to be taken on the morning of stress test.    Murray Hodgkins, NP

## 2014-12-08 ENCOUNTER — Encounter
Admission: RE | Admit: 2014-12-08 | Discharge: 2014-12-08 | Disposition: A | Discharge status: Home / Self Care | Payer: Medicaid Other | Source: Ambulatory Visit | Attending: Cardiovascular Disease | Admitting: Cardiovascular Disease

## 2014-12-08 ENCOUNTER — Telehealth: Payer: Self-pay | Admitting: *Deleted

## 2014-12-08 ENCOUNTER — Ambulatory Visit: Payer: Self-pay | Admitting: Sports Medicine

## 2014-12-08 DIAGNOSIS — R079 Chest pain, unspecified: Secondary | ICD-10-CM

## 2014-12-08 MED ORDER — TECHNETIUM TC 99M SESTAMIBI - CARDIOLITE
13.0000 | Freq: Once | INTRAVENOUS | Status: AC | PRN
  Administered 2014-12-08: 13.58 via INTRAVENOUS

## 2014-12-08 MED ORDER — TECHNETIUM TC 99M SESTAMIBI - CARDIOLITE
30.0000 | Freq: Once | INTRAVENOUS | Status: AC | PRN
  Administered 2014-12-08: 09:00:00 31.12 via INTRAVENOUS

## 2014-12-08 MED ORDER — REGADENOSON 0.4 MG/5ML IV SOLN
0.4000 mg | Freq: Once | INTRAVENOUS | Status: AC
  Administered 2014-12-08: 0.4 mg via INTRAVENOUS
  Filled 2014-12-08: qty 5

## 2014-12-08 NOTE — Telephone Encounter (Signed)
Received records request Disability determination services , forwarded to [facility] for processing 12/08/14.

## 2014-12-09 LAB — NM MYOCAR MULTI W/SPECT W/WALL MOTION / EF
CHL CUP MPHR: 163 {beats}/min
CHL CUP NUCLEAR SDS: 4
CHL CUP NUCLEAR SRS: 5
CHL CUP RESTING HR STRESS: 56 {beats}/min
CSEPHR: 61 %
Estimated workload: 1 METS
Exercise duration (min): 0 min
Exercise duration (sec): 0 s
LV dias vol: 73 mL
LV sys vol: 35 mL
Peak HR: 100 {beats}/min
SSS: 2
TID: 1.41

## 2014-12-14 ENCOUNTER — Encounter: Payer: Self-pay | Admitting: Sports Medicine

## 2014-12-14 ENCOUNTER — Ambulatory Visit (INDEPENDENT_AMBULATORY_CARE_PROVIDER_SITE_OTHER): Payer: Self-pay | Admitting: Sports Medicine

## 2014-12-14 VITALS — BP 154/74 | HR 57 | Ht 62.0 in | Wt 160.0 lb

## 2014-12-14 DIAGNOSIS — M25561 Pain in right knee: Secondary | ICD-10-CM

## 2014-12-14 MED ORDER — TRAMADOL HCL 50 MG PO TABS: 50.0000 mg | ORAL_TABLET | Freq: Three times a day (TID) | ORAL | Status: DC | PRN

## 2014-12-14 NOTE — Progress Notes (Signed)
   Subjective:    Patient ID: Sandra Charles, female    DOB: April 01, 1955, 59 y.o.   MRN: [id number]  HPI   Patient comes in today for follow-up on right knee pain. Cortisone injection administered at her last office visit provided her with about one week of symptom relief. Pain has now returned. It is an aching discomfort primarily along the lateral knee. She does get intermittent swelling and stiffness. Some catching and popping as well. An MRI of this knee showed some degenerative changes in the meniscus but no discrete meniscal tear. 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    Review of Systems    as above Objective:   Physical Exam Well-developed, well-nourished. No acute distress. Awake alert and oriented 3. Vital signs reviewed.  Right knee: Range of motion 0-120. No effusion. She is tender to palpation along the lateral joint line with pain but no popping with McMurray's. No tenderness along the lateral joint line. Good joint stability. Neurovascularly intact distally. Walking with a limp.  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 However, she is not interested in anything surgical at this point in time. Therefore I will try her in a simple knee brace. She has also had some success with tramadol in the past so I'll give her a refill on this to take as needed. She understands that referral to orthopedics is still an option down the road if she changes her mind. Follow-up as needed.

## 2014-12-30 ENCOUNTER — Telehealth: Payer: Self-pay

## 2014-12-30 NOTE — Telephone Encounter (Signed)
Received records request DISABILITY DETERMINATION SERVICES, forwarded to [employer] for processing.  

## 2015-01-11 ENCOUNTER — Other Ambulatory Visit: Payer: Self-pay | Admitting: *Deleted

## 2015-01-11 DIAGNOSIS — M25561 Pain in right knee: Secondary | ICD-10-CM

## 2015-01-11 MED ORDER — TRAMADOL HCL 50 MG PO TABS: 50.0000 mg | ORAL_TABLET | Freq: Three times a day (TID) | ORAL | Status: DC | PRN

## 2015-02-10 ENCOUNTER — Encounter: Payer: Self-pay | Admitting: Pharmacist

## 2015-05-13 ENCOUNTER — Emergency Department: Payer: Medicaid Other

## 2015-05-13 ENCOUNTER — Observation Stay
Admission: EM | Admit: 2015-05-13 | Discharge: 2015-05-15 | Disposition: A | Discharge status: Home / Self Care | Payer: Medicaid Other | Attending: Internal Medicine | Admitting: Internal Medicine

## 2015-05-13 DIAGNOSIS — I517 Cardiomegaly: Secondary | ICD-10-CM | POA: Insufficient documentation

## 2015-05-13 DIAGNOSIS — Z7901 Long term (current) use of anticoagulants: Secondary | ICD-10-CM | POA: Insufficient documentation

## 2015-05-13 DIAGNOSIS — R410 Disorientation, unspecified: Secondary | ICD-10-CM | POA: Insufficient documentation

## 2015-05-13 DIAGNOSIS — Z87891 Personal history of nicotine dependence: Secondary | ICD-10-CM | POA: Insufficient documentation

## 2015-05-13 DIAGNOSIS — T404X5A Adverse effect of other synthetic narcotics, initial encounter: Secondary | ICD-10-CM | POA: Diagnosis not present

## 2015-05-13 DIAGNOSIS — R32 Unspecified urinary incontinence: Secondary | ICD-10-CM | POA: Diagnosis not present

## 2015-05-13 DIAGNOSIS — F329 Major depressive disorder, single episode, unspecified: Secondary | ICD-10-CM | POA: Insufficient documentation

## 2015-05-13 DIAGNOSIS — R4182 Altered mental status, unspecified: Secondary | ICD-10-CM | POA: Diagnosis not present

## 2015-05-13 DIAGNOSIS — Z8249 Family history of ischemic heart disease and other diseases of the circulatory system: Secondary | ICD-10-CM | POA: Diagnosis not present

## 2015-05-13 DIAGNOSIS — Z8673 Personal history of transient ischemic attack (TIA), and cerebral infarction without residual deficits: Secondary | ICD-10-CM | POA: Insufficient documentation

## 2015-05-13 DIAGNOSIS — Z90710 Acquired absence of both cervix and uterus: Secondary | ICD-10-CM | POA: Insufficient documentation

## 2015-05-13 DIAGNOSIS — Z803 Family history of malignant neoplasm of breast: Secondary | ICD-10-CM | POA: Insufficient documentation

## 2015-05-13 DIAGNOSIS — I10 Essential (primary) hypertension: Secondary | ICD-10-CM | POA: Diagnosis not present

## 2015-05-13 DIAGNOSIS — F419 Anxiety disorder, unspecified: Secondary | ICD-10-CM | POA: Diagnosis not present

## 2015-05-13 DIAGNOSIS — R569 Unspecified convulsions: Principal | ICD-10-CM | POA: Insufficient documentation

## 2015-05-13 DIAGNOSIS — Z7982 Long term (current) use of aspirin: Secondary | ICD-10-CM | POA: Diagnosis not present

## 2015-05-13 DIAGNOSIS — R51 Headache: Secondary | ICD-10-CM | POA: Insufficient documentation

## 2015-05-13 DIAGNOSIS — S01552A Open bite of oral cavity, initial encounter: Secondary | ICD-10-CM | POA: Insufficient documentation

## 2015-05-13 DIAGNOSIS — R0789 Other chest pain: Secondary | ICD-10-CM | POA: Insufficient documentation

## 2015-05-13 DIAGNOSIS — E785 Hyperlipidemia, unspecified: Secondary | ICD-10-CM | POA: Diagnosis not present

## 2015-05-13 DIAGNOSIS — G459 Transient cerebral ischemic attack, unspecified: Secondary | ICD-10-CM

## 2015-05-13 DIAGNOSIS — E860 Dehydration: Secondary | ICD-10-CM | POA: Diagnosis not present

## 2015-05-13 DIAGNOSIS — K219 Gastro-esophageal reflux disease without esophagitis: Secondary | ICD-10-CM | POA: Diagnosis not present

## 2015-05-13 LAB — BASIC METABOLIC PANEL
ANION GAP: 9 (ref 5–15)
BUN: 11 mg/dL (ref 6–20)
CALCIUM: 9.8 mg/dL (ref 8.9–10.3)
CO2: 24 mmol/L (ref 22–32)
CREATININE: 0.69 mg/dL (ref 0.44–1.00)
Chloride: 102 mmol/L (ref 101–111)
GFR calc Af Amer: >60 mL/min (ref >60)
GLUCOSE: 108 mg/dL — AB (ref 65–99)
Potassium: 3.6 mmol/L (ref 3.5–5.1)
Sodium: 135 mmol/L (ref 135–145)

## 2015-05-13 LAB — URINALYSIS COMPLETE WITH MICROSCOPIC (ARMC ONLY)
BILIRUBIN URINE: NEGATIVE
GLUCOSE, UA: NEGATIVE mg/dL
NITRITE: NEGATIVE
PH: 5 (ref 5.0–8.0)
Protein, ur: 30 mg/dL — AB
Specific Gravity, Urine: 1.029 (ref 1.005–1.030)

## 2015-05-13 LAB — CBC
HCT: 43.4 % (ref 35.0–47.0)
Hemoglobin: 14.6 g/dL (ref 12.0–16.0)
MCH: 29.1 pg (ref 26.0–34.0)
MCHC: 33.7 g/dL (ref 32.0–36.0)
MCV: 86.5 fL (ref 80.0–100.0)
PLATELETS: 182 10*3/uL (ref 150–440)
RBC: 5.01 MIL/uL (ref 3.80–5.20)
RDW: 15.2 % — AB (ref 11.5–14.5)
WBC: 13.2 10*3/uL — ABNORMAL HIGH (ref 3.6–11.0)

## 2015-05-13 MED ORDER — ONDANSETRON HCL 4 MG/2ML IJ SOLN
4.0000 mg | Freq: Once | INTRAMUSCULAR | Status: AC
  Administered 2015-05-13: 4 mg via INTRAVENOUS
  Filled 2015-05-13: qty 2

## 2015-05-13 MED ORDER — HYDROMORPHONE HCL 1 MG/ML IJ SOLN
0.5000 mg | Freq: Once | INTRAMUSCULAR | Status: AC
  Administered 2015-05-13: 0.5 mg via INTRAVENOUS
  Filled 2015-05-13: qty 1

## 2015-05-13 NOTE — ED Notes (Signed)
MD at bedside. 

## 2015-05-13 NOTE — ED Notes (Signed)
Patient reports that her blood pressure is up and concerned she might be having seizures.  Reports she has woke up and there is blood in her mouth and felt like she had been chewing on her tounge (areas on both sides of tounge appear injured) and that she has been incontinent.

## 2015-05-13 NOTE — ED Provider Notes (Addendum)
Time Seen: Approximately 2150  I have reviewed the triage notes  Chief Complaint: Hypertension   History of Present Illness: Sandra Charles is a 60 y.o. female *who presents with the chief complaints of hypertension, headache, possible seizure. Patient states she woke up this morning and noticed blood on her pillow and she had some urinary incontinence. She has no history of seizures and bit both sides of her tongue. She denies any defecation. Patient states that she's noticed a headache throughout the day and alsothat her blood pressure was running more elevated than normal. She states she has been taken her blood pressure medications. She denies any chest pain, shortness of breath, focal weakness, etc. Past Medical History  Diagnosis Date  . Hypertension   . Anxiety   . Depression   . Mini stroke ([place])   . Hyperlipidemia     Patient Active Problem List   Diagnosis Date Noted  . Atypical chest pain 11/25/2014  . GERD (gastroesophageal reflux disease) 11/25/2014  . Essential hypertension 11/25/2014    Past Surgical History  Procedure Laterality Date  . Vaginal hysterectomy      Past Surgical History  Procedure Laterality Date  . Vaginal hysterectomy      Current Outpatient Rx  Name  Route  Sig  Dispense  Refill  . ALPRAZolam (XANAX) 0.25 MG tablet   Oral   Take 1 tablet (0.25 mg total) by mouth at bedtime as needed for anxiety.   1 tablet   0   . amitriptyline (ELAVIL) 50 MG tablet   Oral   Take 50 mg by mouth at bedtime.         Marland Kitchen amLODipine (NORVASC) 10 MG tablet   Oral   Take 10 mg by mouth daily. for high blood pressure      1   . aspirin 81 MG tablet   Oral   Take 81 mg by mouth daily.         . carvedilol (COREG) 12.5 MG tablet   Oral   Take 1 tablet (12.5 mg total) by mouth 2 (two) times daily with a meal.   60 tablet   11   . Cholecalciferol (VITAMIN D3) 5000 UNITS TABS   Oral   Take 5,000 Units by mouth daily.         . citalopram  (CELEXA) 40 MG tablet   Oral   Take 40 mg by mouth daily.         Marland Kitchen FLUoxetine (PROZAC) 20 MG capsule   Oral   Take 20 mg by mouth daily.         . hydrochlorothiazide (HYDRODIURIL) 25 MG tablet   Oral   Take 25 mg by mouth daily. for high blood pressure      1   . lisinopril-hydrochlorothiazide (PRINZIDE,ZESTORETIC) 20-25 MG tablet   Oral   Take 1 tablet by mouth daily.         Marland Kitchen lovastatin (MEVACOR) 20 MG tablet   Oral   Take 20 mg by mouth at bedtime.         . pantoprazole (PROTONIX) 40 MG tablet   Oral   Take 1 tablet (40 mg total) by mouth daily.   30 tablet   11   . potassium chloride SA (K-DUR,KLOR-CON) 20 MEQ tablet   Oral   Take 20 mEq by mouth daily.          . ranitidine (ZANTAC) 150 MG tablet   Oral   Take  1 tablet (150 mg total) by mouth 2 (two) times daily.   60 tablet   0   . traMADol (ULTRAM) 50 MG tablet   Oral   Take 1 tablet (50 mg total) by mouth every 8 (eight) hours as needed.   60 tablet   1   . traZODone (DESYREL) 100 MG tablet   Oral   Take 200 mg by mouth at bedtime.           Allergies:  Review of patient's allergies indicates no known allergies.  Family History: Family History  Problem Relation Age of Onset  . Hyperlipidemia Mother   . Breast cancer Mother 62  . Hypertension Mother   . Heart attack Mother     age 14's  . Breast cancer Sister 43  . Hyperlipidemia Father   . Hypertension Father   . Heart attack Father 69    Social History: Social History  Substance Use Topics  . Smoking status: Former Smoker -- 1.00 packs/day for 5 years    Types: Cigarettes  . Smokeless tobacco: Not on file  . Alcohol Use: No     Review of Systems:   10 point review of systems was performed and was otherwise negative:  Constitutional: No fever Eyes: No visual disturbances ENT: No sore throat, ear pain Cardiac: No chest pain Respiratory: No shortness of breath, wheezing, or stridor Abdomen: No abdominal pain,  no vomiting, No diarrhea Endocrine: No weight loss, No night sweats Extremities: No peripheral edema, cyanosis Skin: No rashes, easy bruising Neurologic: Headache is global in nature without any focal weakness and no neck pain or fever Urologic: No dysuria, Hematuria, or urinary frequency   Physical Exam:  ED Triage Vitals  Enc Vitals Group     BP 05/13/15 2133 181/88 mmHg     Pulse Rate 05/13/15 2133 106     Resp 05/13/15 2133 22     Temp 05/13/15 2133 98.5 F (36.9 C)     Temp Source 05/13/15 2133 Oral     SpO2 05/13/15 2133 94 %     Weight 05/13/15 2133 180 lb (81.647 kg)     Height 05/13/15 2133 5\' 1"  (1.549 m)     Head Cir --      Peak Flow --      Pain Score 05/13/15 2143 10     Pain Loc --      Pain Edu? --      Excl. in [place]? --     General: Awake , Alert , and Oriented times 2, Glasgow Coma Scale 15 Head: Normal cephalic , atraumatic Eyes: Pupils equal , round, reactive to light. No papilledema Nose/Throat: No nasal drainage, patent upper airway without erythema or exudate. Patient does have a crush injury to both sides of her tongue nonsuturable lesions Neck: Supple, Full range of motion, No anterior adenopathy or palpable thyroid masses Lungs: Clear to ascultation without wheezes , rhonchi, or rales Heart: Regular rate, regular rhythm without murmurs , gallops , or rubs Abdomen: Soft, non tender without rebound, guarding , or rigidity; bowel sounds positive and symmetric in all 4 quadrants. No organomegaly .        Extremities: 2 plus symmetric pulses. No edema, clubbing or cyanosis Neurologic: normal ambulation, Motor symmetric without deficits, sensory intact Skin: warm, dry, no rashes   Labs:   All laboratory work was reviewed including any pertinent negatives or positives listed below:  [place] - Abnormal; Notable for the  following:    Glucose, Bld 108 (*)    All other components within normal limits  CBC - Abnormal; Notable  for the following:    WBC 13.2 (*)    RDW 15.2 (*)    All other components within normal limits  URINALYSIS COMPLETEWITH MICROSCOPIC ([facility] ONLY) - Abnormal; Notable for the following:    Color, Urine YELLOW (*)    APPearance HAZY (*)    Ketones, ur 2+ (*)    Hgb urine dipstick 1+ (*)    Protein, ur 30 (*)    Leukocytes, UA TRACE (*)    Bacteria, UA RARE (*)    Squamous Epithelial / LPF 6-30 (*)    All other components within normal limits  URINE DRUG SCREEN, QUALITATIVE ([facility] ONLY)  CBG MONITORING, ED  Reviewed the patient's laboratory work at this point does not show any significant abnormalities  EKG:   ED ECG REPORT I, Daymon Larsen, the attending physician, personally viewed and interpreted this ECG.  Date: 05/13/2015 EKG Time: 2142 Rate: 99 Rhythm: normal sinus rhythm QRS Axis: normal Intervals: normal ST/T Wave abnormalities: Nonspecific T wave abnormalities Conduction Disturbances: none Narrative Interpretation: unremarkable Left ventricular hypertrophy   Radiology:   Narrative:    CLINICAL DATA: Hypertension. Incontinence. Possible seizure.  EXAM: CT HEAD WITHOUT CONTRAST  TECHNIQUE: Contiguous axial images were obtained from the base of the skull through the vertex without intravenous contrast.  COMPARISON: Head CT and brain MR dated 05/27/2014.  FINDINGS: Minimal patchy white matter low density in both cerebral hemispheres. Normal size and position of the ventricles. No intracranial hemorrhage, mass lesion or CT evidence of acute infarction. Unremarkable bones and included paranasal sinuses.  IMPRESSION: No acute abnormality. Stable minimal chronic small vessel white matter ischemic changes in both cerebral hemispheres.   Electronically Signed       I personally reviewed the radiologic studies    ED Course:  Patient's stay here was uneventful and currently her head CT shows no focal abnormalities that would create a seizure. The  patient most likely did have a seizure this morning that was unwitnessed. She has no history of seizures. Patient's etiology of her seizure is unknown though she still maintained some confusion and is concern from the family that she still doesn't know the area and has some short-term memory deficits.   Assessment:  New-onset seizure Hypertension     Plan: * Inpatient management Daymon Larsen, MD 05/13/15 2359  Daymon Larsen, MD 05/14/15 [phone]

## 2015-05-13 NOTE — ED Notes (Signed)
Patient transported to CT 

## 2015-05-14 ENCOUNTER — Observation Stay: Payer: Medicaid Other

## 2015-05-14 ENCOUNTER — Encounter: Payer: Self-pay | Admitting: Internal Medicine

## 2015-05-14 DIAGNOSIS — R569 Unspecified convulsions: Secondary | ICD-10-CM | POA: Diagnosis not present

## 2015-05-14 DIAGNOSIS — R4182 Altered mental status, unspecified: Secondary | ICD-10-CM | POA: Diagnosis present

## 2015-05-14 LAB — URINE DRUG SCREEN, QUALITATIVE (ARMC ONLY)
Amphetamines, Ur Screen: NOT DETECTED
Barbiturates, Ur Screen: NOT DETECTED
Benzodiazepine, Ur Scrn: NOT DETECTED
COCAINE METABOLITE, UR ~~LOC~~: NOT DETECTED
Cannabinoid 50 Ng, Ur ~~LOC~~: POSITIVE — AB
MDMA (ECSTASY) UR SCREEN: NOT DETECTED
METHADONE SCREEN, URINE: NOT DETECTED
Opiate, Ur Screen: POSITIVE — AB
Phencyclidine (PCP) Ur S: NOT DETECTED
TRICYCLIC, UR SCREEN: NOT DETECTED

## 2015-05-14 LAB — PHOSPHORUS: Phosphorus: 3.7 mg/dL (ref 2.5–4.6)

## 2015-05-14 LAB — BASIC METABOLIC PANEL
Anion gap: 7 (ref 5–15)
BUN: 12 mg/dL (ref 6–20)
CHLORIDE: 105 mmol/L (ref 101–111)
CO2: 25 mmol/L (ref 22–32)
CREATININE: 0.68 mg/dL (ref 0.44–1.00)
Calcium: 9.3 mg/dL (ref 8.9–10.3)
GFR calc Af Amer: >60 mL/min (ref >60)
GFR calc non Af Amer: >60 mL/min (ref >60)
GLUCOSE: 101 mg/dL — AB (ref 65–99)
Potassium: 3.7 mmol/L (ref 3.5–5.1)
Sodium: 137 mmol/L (ref 135–145)

## 2015-05-14 LAB — CBC
HCT: 38.5 % (ref 35.0–47.0)
Hemoglobin: 13 g/dL (ref 12.0–16.0)
MCH: 29.8 pg (ref 26.0–34.0)
MCHC: 33.8 g/dL (ref 32.0–36.0)
MCV: 88 fL (ref 80.0–100.0)
PLATELETS: 161 10*3/uL (ref 150–440)
RBC: 4.38 MIL/uL (ref 3.80–5.20)
RDW: 15.1 % — ABNORMAL HIGH (ref 11.5–14.5)
WBC: 9 10*3/uL (ref 3.6–11.0)

## 2015-05-14 LAB — MAGNESIUM: Magnesium: 2 mg/dL (ref 1.7–2.4)

## 2015-05-14 MED ORDER — PANTOPRAZOLE SODIUM 40 MG PO TBEC
40.0000 mg | DELAYED_RELEASE_TABLET | Freq: Every day | ORAL | Status: DC
  Administered 2015-05-14 – 2015-05-15 (×2): 40 mg via ORAL
  Filled 2015-05-14 (×2): qty 1

## 2015-05-14 MED ORDER — CITALOPRAM HYDROBROMIDE 20 MG PO TABS
40.0000 mg | ORAL_TABLET | Freq: Every day | ORAL | Status: DC
  Administered 2015-05-15: 40 mg via ORAL
  Filled 2015-05-14: qty 2

## 2015-05-14 MED ORDER — VITAMIN D 1000 UNITS PO TABS
5000.0000 [IU] | ORAL_TABLET | Freq: Every day | ORAL | Status: DC
Start: 2015-05-14 — End: 2015-05-15
  Administered 2015-05-14: 5000 [IU] via ORAL
  Filled 2015-05-14: qty 5

## 2015-05-14 MED ORDER — LEVETIRACETAM 500 MG PO TABS
500.0000 mg | ORAL_TABLET | Freq: Two times a day (BID) | ORAL | Status: DC
  Administered 2015-05-15: 500 mg via ORAL
  Filled 2015-05-14: qty 1

## 2015-05-14 MED ORDER — CARVEDILOL 12.5 MG PO TABS
12.5000 mg | ORAL_TABLET | Freq: Two times a day (BID) | ORAL | Status: DC
  Administered 2015-05-14 – 2015-05-15 (×3): 12.5 mg via ORAL
  Filled 2015-05-14 (×3): qty 1

## 2015-05-14 MED ORDER — ACETAMINOPHEN 325 MG PO TABS: 650.0000 mg | ORAL_TABLET | Freq: Four times a day (QID) | ORAL | Status: DC | PRN

## 2015-05-14 MED ORDER — LORAZEPAM 2 MG/ML IJ SOLN
INTRAMUSCULAR | Status: AC
  Filled 2015-05-14: qty 1

## 2015-05-14 MED ORDER — ENOXAPARIN SODIUM 40 MG/0.4ML ~~LOC~~ SOLN
40.0000 mg | SUBCUTANEOUS | Status: DC
  Administered 2015-05-14 – 2015-05-15 (×2): 40 mg via SUBCUTANEOUS
  Filled 2015-05-14 (×2): qty 0.4

## 2015-05-14 MED ORDER — ACETAMINOPHEN 650 MG RE SUPP: 650.0000 mg | Freq: Four times a day (QID) | RECTAL | Status: DC | PRN

## 2015-05-14 MED ORDER — HYDROCODONE-ACETAMINOPHEN 5-325 MG PO TABS
1.0000 | ORAL_TABLET | Freq: Four times a day (QID) | ORAL | Status: DC | PRN
Start: 2015-05-14 — End: 2015-08-18

## 2015-05-14 MED ORDER — SODIUM CHLORIDE 0.9% FLUSH
3.0000 mL | Freq: Two times a day (BID) | INTRAVENOUS | Status: DC
  Administered 2015-05-14: 3 mL via INTRAVENOUS

## 2015-05-14 MED ORDER — SODIUM CHLORIDE 0.9 % IV SOLN
1000.0000 mg | Freq: Once | INTRAVENOUS | Status: AC
  Administered 2015-05-14: 1000 mg via INTRAVENOUS
  Filled 2015-05-14: qty 10

## 2015-05-14 MED ORDER — LORAZEPAM 2 MG/ML IJ SOLN
1.0000 mg | Freq: Once | INTRAMUSCULAR | Status: AC
  Administered 2015-05-14: 1 mg via INTRAVENOUS

## 2015-05-14 MED ORDER — SODIUM CHLORIDE 0.9 % IV SOLN
INTRAVENOUS | Status: DC
  Administered 2015-05-14 – 2015-05-15 (×2): via INTRAVENOUS

## 2015-05-14 MED ORDER — ASPIRIN EC 81 MG PO TBEC
81.0000 mg | DELAYED_RELEASE_TABLET | Freq: Every day | ORAL | Status: DC
Start: 2015-05-14 — End: 2015-05-15
  Administered 2015-05-14 – 2015-05-15 (×2): 81 mg via ORAL
  Filled 2015-05-14 (×2): qty 1

## 2015-05-14 MED ORDER — LEVETIRACETAM 750 MG PO TABS: 750.0000 mg | ORAL_TABLET | Freq: Two times a day (BID) | ORAL | Status: DC

## 2015-05-14 MED ORDER — PRAVASTATIN SODIUM 20 MG PO TABS
20.0000 mg | ORAL_TABLET | Freq: Every day | ORAL | Status: DC
  Administered 2015-05-14: 20 mg via ORAL
  Filled 2015-05-14: qty 1

## 2015-05-14 MED ORDER — SENNOSIDES-DOCUSATE SODIUM 8.6-50 MG PO TABS: 1.0000 | ORAL_TABLET | Freq: Every evening | ORAL | Status: DC | PRN

## 2015-05-14 MED ORDER — BENZOCAINE 10 % MT GEL
Freq: Four times a day (QID) | OROMUCOSAL | Status: DC | PRN
  Filled 2015-05-14: qty 9.4

## 2015-05-14 MED ORDER — AMLODIPINE BESYLATE 10 MG PO TABS
10.0000 mg | ORAL_TABLET | Freq: Every day | ORAL | Status: DC
  Administered 2015-05-14 – 2015-05-15 (×2): 10 mg via ORAL
  Filled 2015-05-14 (×2): qty 1

## 2015-05-14 MED ORDER — ASPIRIN EC 81 MG PO TBEC
81.0000 mg | DELAYED_RELEASE_TABLET | Freq: Once | ORAL | Status: AC
  Administered 2015-05-14: 81 mg via ORAL
  Filled 2015-05-14: qty 1

## 2015-05-14 MED ORDER — GADOBENATE DIMEGLUMINE 529 MG/ML IV SOLN
20.0000 mL | Freq: Once | INTRAVENOUS | Status: AC | PRN
  Administered 2015-05-14: 16 mL via INTRAVENOUS

## 2015-05-14 MED ORDER — AMITRIPTYLINE HCL 25 MG PO TABS
50.0000 mg | ORAL_TABLET | Freq: Every day | ORAL | Status: DC
  Administered 2015-05-14: 50 mg via ORAL
  Filled 2015-05-14: qty 2

## 2015-05-14 MED ORDER — HYDRALAZINE HCL 20 MG/ML IJ SOLN: 10.0000 mg | Freq: Four times a day (QID) | INTRAMUSCULAR | Status: DC | PRN

## 2015-05-14 MED ORDER — TRAZODONE HCL 100 MG PO TABS
200.0000 mg | ORAL_TABLET | Freq: Every day | ORAL | Status: DC
  Filled 2015-05-14: qty 2

## 2015-05-14 MED ORDER — ASPIRIN EC 81 MG PO TBEC: 81.0000 mg | DELAYED_RELEASE_TABLET | Freq: Every day | ORAL | Status: DC

## 2015-05-14 MED ORDER — TRAMADOL HCL 50 MG PO TABS: 50.0000 mg | ORAL_TABLET | Freq: Four times a day (QID) | ORAL | Status: DC | PRN

## 2015-05-14 MED ORDER — FAMOTIDINE 20 MG PO TABS
20.0000 mg | ORAL_TABLET | Freq: Every day | ORAL | Status: DC
  Administered 2015-05-14 – 2015-05-15 (×2): 20 mg via ORAL
  Filled 2015-05-14 (×2): qty 1

## 2015-05-14 MED ORDER — FLUOXETINE HCL 20 MG PO CAPS: 20.0000 mg | ORAL_CAPSULE | Freq: Every day | ORAL | Status: DC

## 2015-05-14 NOTE — Plan of Care (Signed)
Problem: Pain Managment: Goal: General experience of comfort will improve Outcome: Completed/Met Date Met:  05/14/15 Patient is sleeping comfortably.

## 2015-05-14 NOTE — Consult Note (Signed)
Reason for Consult:Seizure Referring Physician: Tressia Miners  CC: seizure  HPI: Sandra Charles is an 60 y.o. female with a known history of hypertension, hyperlipidemia, anxiety disorder, TIA in the past presented to the emergency room after she woke up yesterday morning and saw some blood on the pillow. She had bitten her tongue on both sides. She had a similar episode 2 days ago. It is not known whether patient had a seizure, as it was unwitnessed. No history of any chest pain. Patient in the ED appeared to be in postictal state, a little bit lethargic and slow to respond. She is completely oriented to time place and person. No history of fall or head injury. Patient had bladder incontinence, but no evidence of any bowel incontinence.   Past Medical History  Diagnosis Date  . Hypertension   . Anxiety   . Depression   . Mini stroke ([place])   . Hyperlipidemia     Past Surgical History  Procedure Laterality Date  . Vaginal hysterectomy      Family History  Problem Relation Age of Onset  . Hyperlipidemia Mother   . Breast cancer Mother 49  . Hypertension Mother   . Heart attack Mother     age 5's  . Breast cancer Sister 56  . Hyperlipidemia Father   . Hypertension Father   . Heart attack Father 8    Social History:  reports that she has quit smoking. Her smoking use included Cigarettes. She has a 5 pack-year smoking history. She does not have any smokeless tobacco history on file. She reports that she does not drink alcohol or use illicit drugs.  No Known Allergies  Medications:  I have reviewed the patient's current medications. Prior to Admission:  Prescriptions prior to admission  Medication Sig Dispense Refill Last Dose  . citalopram (CELEXA) 40 MG tablet Take 40 mg by mouth daily.   Taking  . ALPRAZolam (XANAX) 0.25 MG tablet Take 1 tablet (0.25 mg total) by mouth at bedtime as needed for anxiety. 1 tablet 0   . amitriptyline (ELAVIL) 50 MG tablet Take 50 mg by mouth at  bedtime.   Taking  . amLODipine (NORVASC) 10 MG tablet Take 10 mg by mouth daily. for high blood pressure  1 Taking  . aspirin 81 MG tablet Take 81 mg by mouth daily.   Taking  . carvedilol (COREG) 12.5 MG tablet Take 1 tablet (12.5 mg total) by mouth 2 (two) times daily with a meal. 60 tablet 11   . Cholecalciferol (VITAMIN D3) 5000 UNITS TABS Take 5,000 Units by mouth daily.   Taking  . FLUoxetine (PROZAC) 20 MG capsule Take 20 mg by mouth daily.   Taking  . hydrochlorothiazide (HYDRODIURIL) 25 MG tablet Take 25 mg by mouth daily. for high blood pressure  1   . lisinopril-hydrochlorothiazide (PRINZIDE,ZESTORETIC) 20-25 MG tablet Take 1 tablet by mouth daily.   Taking  . lovastatin (MEVACOR) 20 MG tablet Take 20 mg by mouth at bedtime.   Taking  . pantoprazole (PROTONIX) 40 MG tablet Take 1 tablet (40 mg total) by mouth daily. 30 tablet 11   . potassium chloride SA (K-DUR,KLOR-CON) 20 MEQ tablet Take 20 mEq by mouth daily.    Taking  . ranitidine (ZANTAC) 150 MG tablet Take 1 tablet (150 mg total) by mouth 2 (two) times daily. 60 tablet 0 Taking  . traMADol (ULTRAM) 50 MG tablet Take 1 tablet (50 mg total) by mouth every 8 (eight) hours as needed.  60 tablet 1   . traZODone (DESYREL) 100 MG tablet Take 200 mg by mouth at bedtime.   Taking   Scheduled: . amitriptyline  50 mg Oral QHS  . amLODipine  10 mg Oral Daily  . aspirin EC  81 mg Oral Daily  . carvedilol  12.5 mg Oral BID WC  . cholecalciferol  5,000 Units Oral Daily  . citalopram  40 mg Oral Daily  . enoxaparin (LOVENOX) injection  40 mg Subcutaneous Q24H  . famotidine  20 mg Oral Daily  . [START ON 05/15/2015] levETIRAcetam  750 mg Oral BID  . pantoprazole  40 mg Oral Daily  . pravastatin  20 mg Oral q1800  . sodium chloride flush  3 mL Intravenous Q12H  . traZODone  200 mg Oral QHS    ROS: History obtained from the patient  General ROS: negative for - chills, fatigue, fever, night sweats, weight gain or weight  loss Psychological ROS: negative for - behavioral disorder, hallucinations, memory difficulties, mood swings or suicidal ideation Ophthalmic ROS: negative for - blurry vision, double vision, eye pain or loss of vision ENT ROS: negative for - epistaxis, nasal discharge, oral lesions, sore throat, tinnitus or vertigo Allergy and Immunology ROS: negative for - hives or itchy/watery eyes Hematological and Lymphatic ROS: negative for - bleeding problems, bruising or swollen lymph nodes Endocrine ROS: negative for - galactorrhea, hair pattern changes, polydipsia/polyuria or temperature intolerance Respiratory ROS: negative for - cough, hemoptysis, shortness of breath or wheezing Cardiovascular ROS: negative for - chest pain, dyspnea on exertion, edema or irregular heartbeat Gastrointestinal ROS: negative for - abdominal pain, diarrhea, hematemesis, nausea/vomiting or stool incontinence Genito-Urinary ROS: negative for - dysuria, hematuria, incontinence or urinary frequency/urgency Musculoskeletal ROS: negative for - joint swelling or muscular weakness Neurological ROS: as noted in HPI Dermatological ROS: negative for rash and skin lesion changes  Physical Examination: Blood pressure 143/65, pulse 66, temperature 98.4 F (36.9 C), temperature source Oral, resp. rate 18, height 5\' 1"  (1.549 m), weight 69.446 kg (153 lb 1.6 oz), SpO2 93 %.  HEENT-  Normocephalic, no lesions, without obvious abnormality.  Normal external eye and conjunctiva.  Normal TM's bilaterally.  Normal auditory canals and external ears. Normal external nose, mucus membranes and septum.  Normal pharynx. Cardiovascular- S1, S2 normal, pulses palpable throughout   Lungs- chest clear, no wheezing, rales, normal symmetric air entry Abdomen- soft, non-tender; bowel sounds normal; no masses,  no organomegaly Extremities- no edema Lymph-no adenopathy palpable Musculoskeletal-no joint tenderness, deformity or swelling Skin-warm and dry,  no hyperpigmentation, vitiligo, or suspicious lesions  Neurological Examination Mental Status: Alert, oriented, thought content appropriate.  Speech fluent without evidence of aphasia.  Able to follow 3 step commands without difficulty. Cranial Nerves: II: Discs flat bilaterally; Visual fields grossly normal, pupils equal, round, reactive to light and accommodation III,IV, VI: ptosis not present, extra-ocular motions intact bilaterally V,VII: smile symmetric, facial light touch sensation normal bilaterally VIII: hearing normal bilaterally IX,X: gag reflex present XI: bilateral shoulder shrug XII: midline tongue extension Motor: Right : Upper extremity   5/5    Left:     Upper extremity   5/5  Lower extremity   5/5     Lower extremity   5/5 Tone and bulk:normal tone throughout; no atrophy noted Sensory: Pinprick and light touch intact throughout, bilaterally Deep Tendon Reflexes: 2+ and symmetric throughout Plantars: Right: upgoing   Left: upgoing Cerebellar: Normal finger-to-nose and normal heel-to-shin testing bilaterally Gait: not tested due to seizure  precautions      Laboratory Studies:   Basic Metabolic Panel:  Recent Labs Lab 05/13/15 2142 05/14/15 0643  NA 135 137  K 3.6 3.7  CL 102 105  CO2 24 25  GLUCOSE 108* 101*  BUN 11 12  CREATININE 0.69 0.68  CALCIUM 9.8 9.3  MG  --  2.0  PHOS  --  3.7    Liver Function Tests: No results for input(s): AST, ALT, ALKPHOS, BILITOT, PROT, ALBUMIN in the last 168 hours. No results for input(s): LIPASE, AMYLASE in the last 168 hours. No results for input(s): AMMONIA in the last 168 hours.  CBC:  Recent Labs Lab 05/13/15 2142 05/14/15 0643  WBC 13.2* 9.0  HGB 14.6 13.0  HCT 43.4 38.5  MCV 86.5 88.0  PLT 182 161    Cardiac Enzymes: No results for input(s): CKTOTAL, CKMB, CKMBINDEX, TROPONINI in the last 168 hours.  BNP: Invalid input(s): POCBNP  CBG: No results for input(s): GLUCAP in the last 168  hours.  Microbiology: Results for orders placed or performed during the hospital encounter of 07/19/14  Wet prep, genital     Status: Abnormal   Collection Time: 07/19/14  2:12 PM  Result Value Ref Range Status   Yeast Wet Prep HPF POC NONE SEEN NONE SEEN Final   Trich, Wet Prep NONE SEEN NONE SEEN Final   Clue Cells Wet Prep HPF POC NONE SEEN NONE SEEN Final   WBC, Wet Prep HPF POC FEW (A) NONE SEEN Final  Chlamydia/NGC rt PCR [facility])     Status: None   Collection Time: 07/19/14  2:12 PM  Result Value Ref Range Status   Specimen source GC/Chlam CERVIX  Final   Chlamydia Tr NOT DETECTED  Final   N gonorrhoeae NOT DETECTED  Final    Comment: (NOTE) 100  This methodology has not been evaluated in pregnant women or in 200  patients with a history of hysterectomy. 300 400  This methodology will not be performed on patients less than 76  years of age.     Coagulation Studies: No results for input(s): LABPROT, INR in the last 72 hours.  Urinalysis:  Recent Labs Lab 05/13/15 2326  COLORURINE YELLOW*  LABSPEC 1.029  PHURINE 5.0  GLUCOSEU NEGATIVE  HGBUR 1+*  BILIRUBINUR NEGATIVE  KETONESUR 2+*  PROTEINUR 30*  NITRITE NEGATIVE  LEUKOCYTESUR TRACE*    Lipid Panel:  No results found for: CHOL, TRIG, HDL, CHOLHDL, VLDL, LDLCALC  HgbA1C: No results found for: HGBA1C  Urine Drug Screen:     Component Value Date/Time   LABOPIA POSITIVE* 05/13/2015 2326   LABBENZ NONE DETECTED 05/13/2015 2326   AMPHETMU NONE DETECTED 05/13/2015 2326   THCU POSITIVE* 05/13/2015 2326   LABBARB NONE DETECTED 05/13/2015 2326    Alcohol Level: No results for input(s): ETH in the last 168 hours.  Other results: EKG: sinus rhythm at 99 bpm.  Imaging: Ct Head Wo Contrast  05/13/2015  CLINICAL DATA:  Hypertension.  Incontinence.  Possible seizure. EXAM: CT HEAD WITHOUT CONTRAST TECHNIQUE: Contiguous axial images were obtained from the base of the skull through the vertex without intravenous  contrast. COMPARISON:  Head CT and brain MR dated 05/27/2014. FINDINGS: Minimal patchy white matter low density in both cerebral hemispheres. Normal size and position of the ventricles. No intracranial hemorrhage, mass lesion or CT evidence of acute infarction. Unremarkable bones and included paranasal sinuses. IMPRESSION: No acute abnormality. Stable minimal chronic small vessel white matter ischemic changes in both cerebral hemispheres.  Electronically Signed   By: Claudie Revering M.D.   On: 05/13/2015 22:18   Mr Jeri Cos [id number] Contrast  05/14/2015  CLINICAL DATA:  Initial valuation for acute confusion and lethargy. Elevated blood pressure. EXAM: MRI HEAD WITHOUT AND WITH CONTRAST TECHNIQUE: Multiplanar, multiecho pulse sequences of the brain and surrounding structures were obtained without and with intravenous contrast. CONTRAST:  10mL MULTIHANCE GADOBENATE DIMEGLUMINE 529 MG/ML IV SOLN COMPARISON:  Prior CT from 05/13/2015. FINDINGS: Study degraded by motion artifact. Cerebral volume within normal limits for patient age. Minimal chronic small vessel ischemic type changes present within the periventricular and deep white matter. Minimal chronic small vessel ischemic type changes present within the central pons as well. No abnormal foci of restricted diffusion to suggest acute intracranial infarct. Gray-white matter differentiation maintained. Major intracranial vascular flow voids preserved. No acute or chronic intracranial hemorrhage. No areas of chronic infarction identified. No mass lesion, midline shift, or mass effect. No hydrocephalus. No extra-axial fluid collection. Hippocampi are normal in size with normal morphology and signal intensity bilaterally. No abnormal enhancement. Craniocervical junction within normal limits. Visualized upper cervical spine within normal limits. Pituitary gland normal.  No acute abnormality about the orbits. Paranasal sinuses are grossly clear. No mastoid effusion. Inner ear  structures grossly normal. Bone marrow signal intensity grossly within normal limits. No scalp soft tissue abnormality. IMPRESSION: 1. No acute intracranial process identified. 2. Mild chronic small vessel ischemic disease. Electronically Signed   By: Jeannine Boga M.D.   On: 05/14/2015 05:30     Assessment/Plan: 60 year old female with new onset seizures.  Neurological examination nonfocal.  MRI of the brain personally reviewed and shows no abnormalities.  No risk factors identified other than medications.  Patient on multiple antidepressants.  Also on Ultram.    Recommendations: 1.  Serum Magnesium and Phosphorus 2.  Continue Keppra 500mg  BID as maintenance  3.  Seizure precautions 4.  EEG which may be performed as an outpatient 5.  D/C Ultram 6.  Antidepressant regimen to be evaluated by psychiatry.  This may be performed as an outpatient 7.  Patient unable to drive, operate heavy machinery, perform activities at heights and participate in water activities until release by outpatient physician.  Alexis Goodell, MD Neurology [phone] 05/14/2015, 4:57 PM

## 2015-05-14 NOTE — Progress Notes (Signed)
[facility] [place] NAME: Sandra Charles    MR#:  [id number]  DATE OF BIRTH:  08-19-1955  SUBJECTIVE:  CHIEF COMPLAINT:   Chief Complaint  Patient presents with  . Hypertension   -Admitted with confusion after what appears like and seizure episode. Similar episode 2-3 weeks ago. Tongue bite noted and patient was incontinent. -Currently alert and oriented and no focal neurological deficits. -Started on Keppra. No head trauma notified  REVIEW OF SYSTEMS:  Review of Systems  Constitutional: Negative for fever, chills and diaphoresis.  HENT: Negative for ear discharge, ear pain and nosebleeds.        Sore mouth due to tongue bite  Eyes: Negative for blurred vision and double vision.  Respiratory: Negative for cough, shortness of breath and wheezing.   Cardiovascular: Negative for chest pain and palpitations.  Gastrointestinal: Negative for nausea, vomiting, abdominal pain, diarrhea and constipation.  Genitourinary: Negative for dysuria.  Neurological: Positive for seizures and loss of consciousness. Negative for dizziness, sensory change, speech change, focal weakness, weakness and headaches.  Psychiatric/Behavioral: Negative for depression.    DRUG ALLERGIES:  No Known Allergies  VITALS:  Blood pressure 143/65, pulse 66, temperature 98.4 F (36.9 C), temperature source Oral, resp. rate 18, height 5\' 1"  (1.549 m), weight 69.446 kg (153 lb 1.6 oz), SpO2 93 %.  PHYSICAL EXAMINATION:  Physical Exam  GENERAL:  60 y.o.-year-old patient lying in the bed with no acute distress.  EYES: Pupils equal, round, reactive to light and accommodation. No scleral icterus. Extraocular muscles intact.  HEENT: Head atraumatic, normocephalic. Oropharynx and nasopharynx clear.  Tongue with several areas of laceration and bites NECK:  Supple, no jugular venous distention. No thyroid enlargement, no tenderness.  LUNGS: Normal breath sounds bilaterally,  no wheezing, rales,rhonchi or crepitation. No use of accessory muscles of respiration.  CARDIOVASCULAR: S1, S2 normal. No murmurs, rubs, or gallops.  ABDOMEN: Soft, nontender, nondistended. Bowel sounds present. No organomegaly or mass.  EXTREMITIES: No pedal edema, cyanosis, or clubbing.  NEUROLOGIC: Cranial nerves II through XII are intact. Muscle strength 5/5 in all extremities. Sensation intact. Gait not checked.  PSYCHIATRIC: The patient is alert and oriented x 3.  SKIN: No obvious rash, lesion, or ulcer.    LABORATORY PANEL:   CBC  Recent Labs Lab 05/14/15 0643  WBC 9.0  HGB 13.0  HCT 38.5  PLT 161   ------------------------------------------------------------------------------------------------------------------  Chemistries   Recent Labs Lab 05/14/15 0643  NA 137  K 3.7  CL 105  CO2 25  GLUCOSE 101*  BUN 12  CREATININE 0.68  CALCIUM 9.3   ------------------------------------------------------------------------------------------------------------------  Cardiac Enzymes No results for input(s): TROPONINI in the last 168 hours. ------------------------------------------------------------------------------------------------------------------  RADIOLOGY:  Ct Head Wo Contrast  05/13/2015  CLINICAL DATA:  Hypertension.  Incontinence.  Possible seizure. EXAM: CT HEAD WITHOUT CONTRAST TECHNIQUE: Contiguous axial images were obtained from the base of the skull through the vertex without intravenous contrast. COMPARISON:  Head CT and brain MR dated 05/27/2014. FINDINGS: Minimal patchy white matter low density in both cerebral hemispheres. Normal size and position of the ventricles. No intracranial hemorrhage, mass lesion or CT evidence of acute infarction. Unremarkable bones and included paranasal sinuses. IMPRESSION: No acute abnormality. Stable minimal chronic small vessel white matter ischemic changes in both cerebral hemispheres. Electronically Signed   By: Claudie Revering  M.D.   On: 05/13/2015 22:18   Mr Jeri Cos [id number] Contrast  05/14/2015  CLINICAL DATA:  Initial valuation  for acute confusion and lethargy. Elevated blood pressure. EXAM: MRI HEAD WITHOUT AND WITH CONTRAST TECHNIQUE: Multiplanar, multiecho pulse sequences of the brain and surrounding structures were obtained without and with intravenous contrast. CONTRAST:  61mL MULTIHANCE GADOBENATE DIMEGLUMINE 529 MG/ML IV SOLN COMPARISON:  Prior CT from 05/13/2015. FINDINGS: Study degraded by motion artifact. Cerebral volume within normal limits for patient age. Minimal chronic small vessel ischemic type changes present within the periventricular and deep white matter. Minimal chronic small vessel ischemic type changes present within the central pons as well. No abnormal foci of restricted diffusion to suggest acute intracranial infarct. Gray-white matter differentiation maintained. Major intracranial vascular flow voids preserved. No acute or chronic intracranial hemorrhage. No areas of chronic infarction identified. No mass lesion, midline shift, or mass effect. No hydrocephalus. No extra-axial fluid collection. Hippocampi are normal in size with normal morphology and signal intensity bilaterally. No abnormal enhancement. Craniocervical junction within normal limits. Visualized upper cervical spine within normal limits. Pituitary gland normal.  No acute abnormality about the orbits. Paranasal sinuses are grossly clear. No mastoid effusion. Inner ear structures grossly normal. Bone marrow signal intensity grossly within normal limits. No scalp soft tissue abnormality. IMPRESSION: 1. No acute intracranial process identified. 2. Mild chronic small vessel ischemic disease. Electronically Signed   By: Jeannine Boga M.D.   On: 05/14/2015 05:30    EKG:   Orders placed or performed during the hospital encounter of 05/13/15  . EKG 12-Lead  . EKG 12-Lead  . ED EKG  . ED EKG    ASSESSMENT AND PLAN:   This 60 year old  female with past medical history significant for hypertension, depression and anxiety presents to the hospital secondary to an unresponsive episode associated with tongue bite and incontinence.  #1 possible seizure episode-on weakness. Similar episode 2 days ago. -Loaded with Keppra and started on oral Keppra twice a day. -Nephrology consult. EEG ordered but likely will not be done on weekend. -CT of the head with no acute abnormality. We'll await neurology consultation to see if MRI needs to be ordered. No focal neurological deficits at this time. Patient is back to her baseline. -Denies any new medication usage that could help decrease the seizure threshold. -Urine tox positive for opiates and marijuana  #2 hypertension-continue Norvasc and Coreg.  #3 history of TIA-no focal neurological deficits. Continue statin and aspirin.  #4 depression and anxiety-continue trazodone, Celexa. Also on when necessary Ativan.  #5 GERD-on Protonix  #6 DVT prophylaxis-on Lovenox.   All the records are reviewed and case discussed with Care Management/Social Workerr. Management plans discussed with the patient, family and they are in agreement.  CODE STATUS: Full Code  TOTAL TIME TAKING CARE OF THIS PATIENT: 36 minutes.   POSSIBLE D/C IN 1-2 DAYS, DEPENDING ON CLINICAL CONDITION.   Gladstone Lighter M.D on 05/14/2015 at 1:36 PM  Between 7am to 6pm - Pager - [phone]  After 6pm go to [email or web address] - password [email or web address] [facility]  Office  [phone]  CC: Primary care physician; Deborha Payment, MD

## 2015-05-14 NOTE — Progress Notes (Signed)
Patient is scheduled to be on prozac and celexa and they have not been verified by pharmacist yet. Called this AM and pharmacy stated they would have a med tech coming to complete her home medication list to confirm which medication the patient is taking. Has not been completed, so pharmacist was called again and is checking on it. Patient has not received any psych meds for today. Will follow up.

## 2015-05-14 NOTE — H&P (Signed)
[place] [facility] [place] NAME: Sandra Charles    MR#:  [id number]  DATE OF BIRTH:  1955/08/17  DATE OF ADMISSION:  05/13/2015  PRIMARY CARE PHYSICIAN: BARNHILL, Delray Alt, MD   REQUESTING/REFERRING PHYSICIAN:   CHIEF COMPLAINT:   Chief Complaint  Patient presents with  . Hypertension    HISTORY OF PRESENT ILLNESS: Sandra Charles  is a 60 y.o. female with a known history of hypertension, hyperlipidemia, anxiety disorder, TIA in the past presented to the emergency room after she woke up yesterday morning and saw some blood over the pillow. She bit her tongue on both sides when she woke up in the morning. She had a similar episode 2 days ago. We did not know whether patient had a seizure, as it was unwitnessed. No history of any chest pain. Patient up appeared to be in postictal state a little bit lethargic and slow to respond. She is completely oriented to time place and person. No history of fall or head injury. Patient had a bladder incontinence, but no evidence of any bowel incontinence. Patient was worked up with CT head which showed no acute intracranial abnormality. No tingling or numbness in any part of the body. No history of any dysarthria, slurred speech and dysphagia.  PAST MEDICAL HISTORY:   Past Medical History  Diagnosis Date  . Hypertension   . Anxiety   . Depression   . Mini stroke ([place])   . Hyperlipidemia     PAST SURGICAL HISTORY: Past Surgical History  Procedure Laterality Date  . Vaginal hysterectomy      SOCIAL HISTORY:  Social History  Substance Use Topics  . Smoking status: Former Smoker -- 1.00 packs/day for 5 years    Types: Cigarettes  . Smokeless tobacco: Not on file  . Alcohol Use: No    FAMILY HISTORY:  Family History  Problem Relation Age of Onset  . Hyperlipidemia Mother   . Breast cancer Mother 7  . Hypertension Mother   . Heart attack Mother     age 32's  . Breast cancer Sister 67  .  Hyperlipidemia Father   . Hypertension Father   . Heart attack Father 24    DRUG ALLERGIES: No Known Allergies  REVIEW OF SYSTEMS:   CONSTITUTIONAL: No fever, has weakness.  EYES: No blurred or double vision.  EARS, NOSE, AND THROAT: No tinnitus or ear pain.  RESPIRATORY: No cough, shortness of breath, wheezing or hemoptysis.  CARDIOVASCULAR: No chest pain, orthopnea, edema.  GASTROINTESTINAL: No nausea, vomiting, diarrhea or abdominal pain.  GENITOURINARY: No dysuria, hematuria.  ENDOCRINE: No polyuria, nocturia,  HEMATOLOGY: No anemia, easy bruising or bleeding SKIN: No rash or lesion. MUSCULOSKELETAL: No joint pain or arthritis.   NEUROLOGIC: No tingling, numbness, weakness.  PSYCHIATRY: No anxiety or depression.   MEDICATIONS AT HOME:  Prior to Admission medications   Medication Sig Start Date End Date Taking? Authorizing Provider  ALPRAZolam (XANAX) 0.25 MG tablet Take 1 tablet (0.25 mg total) by mouth at bedtime as needed for anxiety. 12/07/14   Rogelia Mire, NP  amitriptyline (ELAVIL) 50 MG tablet Take 50 mg by mouth at bedtime.    Historical Provider, MD  amLODipine (NORVASC) 10 MG tablet Take 10 mg by mouth daily. for high blood pressure 08/17/14   Historical Provider, MD  aspirin 81 MG tablet Take 81 mg by mouth daily.    Historical Provider, MD  carvedilol (COREG) 12.5 MG tablet Take 1 tablet (  12.5 mg total) by mouth 2 (two) times daily with a meal. 11/25/14   Wellington Hampshire, MD  Cholecalciferol (VITAMIN D3) 5000 UNITS TABS Take 5,000 Units by mouth daily.    Historical Provider, MD  citalopram (CELEXA) 40 MG tablet Take 40 mg by mouth daily.    Historical Provider, MD  FLUoxetine (PROZAC) 20 MG capsule Take 20 mg by mouth daily.    Historical Provider, MD  hydrochlorothiazide (HYDRODIURIL) 25 MG tablet Take 25 mg by mouth daily. for high blood pressure 12/06/14   Historical Provider, MD  HYDROcodone-acetaminophen (NORCO) 5-325 MG tablet Take 1 tablet by mouth  every 6 (six) hours as needed for moderate pain. 05/14/15   Daymon Larsen, MD  lisinopril-hydrochlorothiazide (PRINZIDE,ZESTORETIC) 20-25 MG tablet Take 1 tablet by mouth daily.    Historical Provider, MD  lovastatin (MEVACOR) 20 MG tablet Take 20 mg by mouth at bedtime.    Historical Provider, MD  pantoprazole (PROTONIX) 40 MG tablet Take 1 tablet (40 mg total) by mouth daily. 11/25/14   Wellington Hampshire, MD  potassium chloride SA (K-DUR,KLOR-CON) 20 MEQ tablet Take 20 mEq by mouth daily.     Historical Provider, MD  ranitidine (ZANTAC) 150 MG tablet Take 1 tablet (150 mg total) by mouth 2 (two) times daily. 10/14/14 10/14/15  Nance Pear, MD  traMADol (ULTRAM) 50 MG tablet Take 1 tablet (50 mg total) by mouth every 8 (eight) hours as needed. 01/11/15   Thurman Coyer, DO  traZODone (DESYREL) 100 MG tablet Take 200 mg by mouth at bedtime.    Historical Provider, MD      PHYSICAL EXAMINATION:   VITAL SIGNS: Blood pressure 147/75, pulse 85, temperature 98.5 F (36.9 C), temperature source Oral, resp. rate 20, height 5\' 1"  (1.549 m), weight 81.647 kg (180 lb), SpO2 95 %.  GENERAL:  60 y.o.-year-old patient lying in the bed with no acute distress.  EYES: Pupils equal, round, reactive to light and accommodation. No scleral icterus. Extraocular muscles intact.  HEENT: Head atraumatic, normocephalic. Oropharynx dry and nasopharynx clear.  NECK:  Supple, no jugular venous distention. No thyroid enlargement, no tenderness.  LUNGS: Normal breath sounds bilaterally, no wheezing, rales,rhonchi or crepitation. No use of accessory muscles of respiration.  CARDIOVASCULAR: S1, S2 normal. No murmurs, rubs, or gallops.  ABDOMEN: Soft, nontender, nondistended. Bowel sounds present. No organomegaly or mass.  EXTREMITIES: No pedal edema, cyanosis, or clubbing.  NEUROLOGIC: Cranial nerves II through XII are intact. Muscle strength 5/5 in all extremities. Sensation intact. Gait not checked. No cerebellar signs  noted. PSYCHIATRIC: The patient is alert and oriented x 3.  SKIN: No obvious rash, lesion, or ulcer.   LABORATORY PANEL:   CBC  Recent Labs Lab 05/13/15 2142  WBC 13.2*  HGB 14.6  HCT 43.4  PLT 182  MCV 86.5  MCH 29.1  MCHC 33.7  RDW 15.2*   ------------------------------------------------------------------------------------------------------------------  Chemistries   Recent Labs Lab 05/13/15 2142  NA 135  K 3.6  CL 102  CO2 24  GLUCOSE 108*  BUN 11  CREATININE 0.69  CALCIUM 9.8   ------------------------------------------------------------------------------------------------------------------ estimated creatinine clearance is 75.1 mL/min (by C-G formula based on Cr of 0.69). ------------------------------------------------------------------------------------------------------------------ No results for input(s): TSH, T4TOTAL, T3FREE, THYROIDAB in the last 72 hours.  Invalid input(s): FREET3   Coagulation profile No results for input(s): INR, PROTIME in the last 168 hours. ------------------------------------------------------------------------------------------------------------------- No results for input(s): DDIMER in the last 72 hours. -------------------------------------------------------------------------------------------------------------------  Cardiac Enzymes No results for input(s): CKMB,  TROPONINI, MYOGLOBIN in the last 168 hours.  Invalid input(s): CK ------------------------------------------------------------------------------------------------------------------ Invalid input(s): POCBNP  ---------------------------------------------------------------------------------------------------------------  Urinalysis    Component Value Date/Time   COLORURINE YELLOW* 05/13/2015 2326   COLORURINE Yellow 06/10/2014 0021   APPEARANCEUR HAZY* 05/13/2015 2326   APPEARANCEUR Hazy 06/10/2014 0021   LABSPEC 1.029 05/13/2015 2326   LABSPEC 1.005  06/10/2014 0021   PHURINE 5.0 05/13/2015 2326   PHURINE 5.0 06/10/2014 0021   GLUCOSEU NEGATIVE 05/13/2015 2326   GLUCOSEU Negative 06/10/2014 0021   HGBUR 1+* 05/13/2015 2326   HGBUR 1+ 06/10/2014 0021   BILIRUBINUR NEGATIVE 05/13/2015 2326   BILIRUBINUR Negative 06/10/2014 0021   KETONESUR 2+* 05/13/2015 2326   KETONESUR Negative 06/10/2014 0021   PROTEINUR 30* 05/13/2015 2326   PROTEINUR Negative 06/10/2014 0021   NITRITE NEGATIVE 05/13/2015 2326   NITRITE Negative 06/10/2014 0021   LEUKOCYTESUR TRACE* 05/13/2015 2326   LEUKOCYTESUR 2+ 06/10/2014 0021     RADIOLOGY: Ct Head Wo Contrast  05/13/2015  CLINICAL DATA:  Hypertension.  Incontinence.  Possible seizure. EXAM: CT HEAD WITHOUT CONTRAST TECHNIQUE: Contiguous axial images were obtained from the base of the skull through the vertex without intravenous contrast. COMPARISON:  Head CT and brain MR dated 05/27/2014. FINDINGS: Minimal patchy white matter low density in both cerebral hemispheres. Normal size and position of the ventricles. No intracranial hemorrhage, mass lesion or CT evidence of acute infarction. Unremarkable bones and included paranasal sinuses. IMPRESSION: No acute abnormality. Stable minimal chronic small vessel white matter ischemic changes in both cerebral hemispheres. Electronically Signed   By: Claudie Revering M.D.   On: 05/13/2015 22:18    EKG: Orders placed or performed during the hospital encounter of 05/13/15  . EKG 12-Lead  . EKG 12-Lead  . ED EKG  . ED EKG    IMPRESSION AND PLAN: 60 year old female patient with history of hypertension, hyperlipidemia, transient ischemic attack presented to the emergency room with confusion and lethargy and elevated blood pressure. Admitting diagnosis 1. Altered mental status which could be postictal state 2. Probable seizure 3. Dehydration 4. Leukocytosis 5. Uncontrolled hypertension Treatment plan Admit patient to telemetry observation bed Aspirin 81 mg  daily Neurology consultation Check EEG IV fluid hydration and hold diuretics Follow-up WBC count Control blood pressure with antihypertensive medication DVT prophylaxis with subcutaneous Lovenox Supportive care.   All the records are reviewed and case discussed with ED provider. Management plans discussed with the patient, family and they are in agreement.  CODE STATUS:FULL Code Status History    This patient does not have a recorded code status. Please follow your organizational policy for patients in this situation.       TOTAL TIME TAKING CARE OF THIS PATIENT: 50 minutes.    Saundra Shelling M.D on 05/14/2015 at 1:16 AM  Between 7am to 6pm - Pager - [phone]  After 6pm go to [email or web address] - password [email or web address] [facility]  Office  [phone]  CC: Primary care physician; Deborha Payment, MD

## 2015-05-14 NOTE — Progress Notes (Signed)
Patient has been alert and oriented. Neuro assessment is WDL. No complaints of pain other than tongue being sore. Given ice chips with some relief. Vitals stable. Will continue to monitor.

## 2015-05-15 LAB — BASIC METABOLIC PANEL
Anion gap: 4 — ABNORMAL LOW (ref 5–15)
BUN: 10 mg/dL (ref 6–20)
CHLORIDE: 109 mmol/L (ref 101–111)
CO2: 26 mmol/L (ref 22–32)
CREATININE: 0.75 mg/dL (ref 0.44–1.00)
Calcium: 8.9 mg/dL (ref 8.9–10.3)
GFR calc non Af Amer: >60 mL/min (ref >60)
Glucose, Bld: 88 mg/dL (ref 65–99)
Potassium: 3.3 mmol/L — ABNORMAL LOW (ref 3.5–5.1)
Sodium: 139 mmol/L (ref 135–145)

## 2015-05-15 MED ORDER — LEVETIRACETAM 500 MG PO TABS: 500.0000 mg | ORAL_TABLET | Freq: Two times a day (BID) | ORAL | Status: DC

## 2015-05-15 MED ORDER — LOVASTATIN 20 MG PO TABS: 20.0000 mg | ORAL_TABLET | Freq: Every day | ORAL | Status: DC

## 2015-05-15 MED ORDER — BENZOCAINE 10 % MT GEL: Freq: Four times a day (QID) | OROMUCOSAL | Status: DC | PRN

## 2015-05-15 MED ORDER — ALPRAZOLAM 1 MG PO TABS: 1.0000 mg | ORAL_TABLET | Freq: Two times a day (BID) | ORAL | Status: DC | PRN

## 2015-05-15 MED ORDER — AMLODIPINE BESYLATE 10 MG PO TABS: 10.0000 mg | ORAL_TABLET | Freq: Every day | ORAL | Status: DC

## 2015-05-15 MED ORDER — CARVEDILOL 12.5 MG PO TABS: 12.5000 mg | ORAL_TABLET | Freq: Two times a day (BID) | ORAL | Status: DC

## 2015-05-15 NOTE — Progress Notes (Signed)
Patient discharged to home, IV site Dc,d.  Bleeding controlled, tele monitor turned in,  CCMD notified, pt given all her instructions and scrips, understanding verbalized, pt left hospital in car with her family.

## 2015-05-15 NOTE — Discharge Summary (Signed)
[place] [facility] [place] NAME: Sandra Charles    MR#:  [id number]  DATE OF BIRTH:  1956/01/16  DATE OF ADMISSION:  05/13/2015 ADMITTING PHYSICIAN: Saundra Shelling, MD  DATE OF DISCHARGE: 05/15/2015 11:57 AM  PRIMARY CARE PHYSICIAN: BARNHILL, JESSICA LEE, MD    ADMISSION DIAGNOSIS:  TIA (transient ischemic attack) [G45.9] Seizure ([place]) [R56.9]  DISCHARGE DIAGNOSIS:  Principal Problem:   Altered mental status   SECONDARY DIAGNOSIS:   Past Medical History  Diagnosis Date  . Hypertension   . Anxiety   . Depression   . Mini stroke ([place])   . Hyperlipidemia     HOSPITAL COURSE:   This 60 year old female with past medical history significant for hypertension, depression and anxiety presents to the hospital secondary to an unresponsive episode associated with tongue bite and incontinence.  #1 Seizure episode-secondary to tramadol since the episode started once Tylenol was started. -Discontinue tramadol. Patient was loaded with Keppra and started on Keppra treatment. -Appreciate neurology consult. Discharge on Keppra twice a day. -EEG and neurology follow up as outpatient.. -CT of the head with no acute abnormality.  -Urine tox positive for opiates and marijuana. Advised against drug use. -No driving.  -Orajel for the tongue healing lacerations.  #2 hypertension-continue Norvasc and Coreg.  #3 history of TIA-no focal neurological deficits. Continue statin and aspirin.  #4 depression and anxiety-continue quetiapine, Celexa. Also on when necessary xanax.  #5 GERD-on Protonix  Patient being discharged home today in stable condition.  DISCHARGE CONDITIONS:   Stable  CONSULTS OBTAINED:  Treatment Team:  Alexis Goodell, MD  DRUG ALLERGIES:  No Known Allergies  DISCHARGE MEDICATIONS:   Discharge Medication List as of 05/15/2015 11:17 AM    START taking these medications   Details  benzocaine (ORAJEL) 10 % mucosal gel Use  as directed in the mouth or throat 4 (four) times daily as needed for mouth pain (to tongue ulcers/lacerations)., Starting 05/15/2015, Until Discontinued, Normal    HYDROcodone-acetaminophen (NORCO) 5-325 MG tablet Take 1 tablet by mouth every 6 (six) hours as needed for moderate pain., Starting 05/14/2015, Until Discontinued, Print    levETIRAcetam (KEPPRA) 500 MG tablet Take 1 tablet (500 mg total) by mouth 2 (two) times daily., Starting 05/15/2015, Until Discontinued, Print      CONTINUE these medications which have CHANGED   Details  ALPRAZolam (XANAX) 1 MG tablet Take 1 tablet (1 mg total) by mouth 2 (two) times daily as needed for anxiety., Starting 05/15/2015, Until Discontinued, Print    amLODipine (NORVASC) 10 MG tablet Take 1 tablet (10 mg total) by mouth daily. for high blood pressure, Starting 05/15/2015, Until Discontinued, Print    carvedilol (COREG) 12.5 MG tablet Take 1 tablet (12.5 mg total) by mouth 2 (two) times daily with a meal., Starting 05/15/2015, Until Discontinued, Print    lovastatin (MEVACOR) 20 MG tablet Take 1 tablet (20 mg total) by mouth at bedtime., Starting 05/15/2015, Until Discontinued, Print      CONTINUE these medications which have NOT CHANGED   Details  aspirin EC 81 MG tablet Take 81 mg by mouth every morning., Until Discontinued, Historical Med    Cholecalciferol (VITAMIN D3) 5000 UNITS TABS Take 5,000 Units by mouth daily., Until Discontinued, Historical Med    citalopram (CELEXA) 40 MG tablet Take 40 mg by mouth daily., Until Discontinued, Historical Med    pantoprazole (PROTONIX) 40 MG tablet Take 1 tablet (40 mg total) by mouth daily., Starting 11/25/2014, Until Discontinued,  Print    QUEtiapine (SEROQUEL) 100 MG tablet Take 200 mg by mouth at bedtime., Until Discontinued, Historical Med      STOP taking these medications     traMADol (ULTRAM) 50 MG tablet      amitriptyline (ELAVIL) 50 MG tablet      aspirin 81 MG tablet      ranitidine  (ZANTAC) 150 MG tablet      traZODone (DESYREL) 100 MG tablet          DISCHARGE INSTRUCTIONS:   1. PCP f/u in 1-2 weeks 2. NO TRAMADOL 3. Neurology f/u and outpatient EEG in 1 week 4. NO DRIVING  If you experience worsening of your admission symptoms, develop shortness of breath, life threatening emergency, suicidal or homicidal thoughts you must seek medical attention immediately by calling 911 or calling your MD immediately  if symptoms less severe.  You Must read complete instructions/literature along with all the possible adverse reactions/side effects for all the Medicines you take and that have been prescribed to you. Take any new Medicines after you have completely understood and accept all the possible adverse reactions/side effects.   Please note  You were cared for by a hospitalist during your hospital stay. If you have any questions about your discharge medications or the care you received while you were in the hospital after you are discharged, you can call the unit and asked to speak with the hospitalist on call if the hospitalist that took care of you is not available. Once you are discharged, your primary care physician will handle any further medical issues. Please note that NO REFILLS for any discharge medications will be authorized once you are discharged, as it is imperative that you return to your primary care physician (or establish a relationship with a primary care physician if you do not have one) for your aftercare needs so that they can reassess your need for medications and monitor your lab values.    Today   CHIEF COMPLAINT:   Chief Complaint  Patient presents with  . Hypertension    VITAL SIGNS:  Blood pressure 163/75, pulse 64, temperature 98.1 F (36.7 C), temperature source Oral, resp. rate 18, height 5\' 1"  (1.549 m), weight 69.446 kg (153 lb 1.6 oz), SpO2 95 %.  I/O:   Intake/Output Summary (Last 24 hours) at 05/15/15 1405 Last data filed at  05/15/15 1135  Gross per 24 hour  Intake 2733.75 ml  Output      0 ml  Net 2733.75 ml    PHYSICAL EXAMINATION:   Physical Exam  GENERAL: 60 y.o.-year-old patient lying in the bed with no acute distress.  EYES: Pupils equal, round, reactive to light and accommodation. No scleral icterus. Extraocular muscles intact.  HEENT: Head atraumatic, normocephalic. Oropharynx and nasopharynx clear.  Tongue with several areas of laceration and bites NECK: Supple, no jugular venous distention. No thyroid enlargement, no tenderness.  LUNGS: Normal breath sounds bilaterally, no wheezing, rales,rhonchi or crepitation. No use of accessory muscles of respiration.  CARDIOVASCULAR: S1, S2 normal. No murmurs, rubs, or gallops.  ABDOMEN: Soft, nontender, nondistended. Bowel sounds present. No organomegaly or mass.  EXTREMITIES: No pedal edema, cyanosis, or clubbing.  NEUROLOGIC: Cranial nerves II through XII are intact. Muscle strength 5/5 in all extremities. Sensation intact. Gait not checked.  PSYCHIATRIC: The patient is alert and oriented x 3.  SKIN: No obvious rash, lesion, or ulcer.   DATA REVIEW:   CBC  Recent Labs Lab 05/14/15 0643  WBC  9.0  HGB 13.0  HCT 38.5  PLT 161    Chemistries   Recent Labs Lab 05/14/15 0643 05/15/15 0643  NA 137 139  K 3.7 3.3*  CL 105 109  CO2 25 26  GLUCOSE 101* 88  BUN 12 10  CREATININE 0.68 0.75  CALCIUM 9.3 8.9  MG 2.0  --     Cardiac Enzymes No results for input(s): TROPONINI in the last 168 hours.  Microbiology Results  Results for orders placed or performed during the hospital encounter of 07/19/14  Wet prep, genital     Status: Abnormal   Collection Time: 07/19/14  2:12 PM  Result Value Ref Range Status   Yeast Wet Prep HPF POC NONE SEEN NONE SEEN Final   Trich, Wet Prep NONE SEEN NONE SEEN Final   Clue Cells Wet Prep HPF POC NONE SEEN NONE SEEN Final   WBC, Wet Prep HPF POC FEW (A) NONE SEEN Final  Chlamydia/NGC rt PCR  [facility])     Status: None   Collection Time: 07/19/14  2:12 PM  Result Value Ref Range Status   Specimen source GC/Chlam CERVIX  Final   Chlamydia Tr NOT DETECTED  Final   N gonorrhoeae NOT DETECTED  Final    Comment: (NOTE) 100  This methodology has not been evaluated in pregnant women or in 200  patients with a history of hysterectomy. 300 400  This methodology will not be performed on patients less than 30  years of age.     RADIOLOGY:  Ct Head Wo Contrast  05/13/2015  CLINICAL DATA:  Hypertension.  Incontinence.  Possible seizure. EXAM: CT HEAD WITHOUT CONTRAST TECHNIQUE: Contiguous axial images were obtained from the base of the skull through the vertex without intravenous contrast. COMPARISON:  Head CT and brain MR dated 05/27/2014. FINDINGS: Minimal patchy white matter low density in both cerebral hemispheres. Normal size and position of the ventricles. No intracranial hemorrhage, mass lesion or CT evidence of acute infarction. Unremarkable bones and included paranasal sinuses. IMPRESSION: No acute abnormality. Stable minimal chronic small vessel white matter ischemic changes in both cerebral hemispheres. Electronically Signed   By: Claudie Revering M.D.   On: 05/13/2015 22:18   Mr Jeri Cos [id number] Contrast  05/14/2015  CLINICAL DATA:  Initial valuation for acute confusion and lethargy. Elevated blood pressure. EXAM: MRI HEAD WITHOUT AND WITH CONTRAST TECHNIQUE: Multiplanar, multiecho pulse sequences of the brain and surrounding structures were obtained without and with intravenous contrast. CONTRAST:  45mL MULTIHANCE GADOBENATE DIMEGLUMINE 529 MG/ML IV SOLN COMPARISON:  Prior CT from 05/13/2015. FINDINGS: Study degraded by motion artifact. Cerebral volume within normal limits for patient age. Minimal chronic small vessel ischemic type changes present within the periventricular and deep white matter. Minimal chronic small vessel ischemic type changes present within the central pons as well. No  abnormal foci of restricted diffusion to suggest acute intracranial infarct. Gray-white matter differentiation maintained. Major intracranial vascular flow voids preserved. No acute or chronic intracranial hemorrhage. No areas of chronic infarction identified. No mass lesion, midline shift, or mass effect. No hydrocephalus. No extra-axial fluid collection. Hippocampi are normal in size with normal morphology and signal intensity bilaterally. No abnormal enhancement. Craniocervical junction within normal limits. Visualized upper cervical spine within normal limits. Pituitary gland normal.  No acute abnormality about the orbits. Paranasal sinuses are grossly clear. No mastoid effusion. Inner ear structures grossly normal. Bone marrow signal intensity grossly within normal limits. No scalp soft tissue abnormality. IMPRESSION: 1. No acute intracranial  process identified. 2. Mild chronic small vessel ischemic disease. Electronically Signed   By: Jeannine Boga M.D.   On: 05/14/2015 05:30    EKG:   Orders placed or performed during the hospital encounter of 05/13/15  . EKG 12-Lead  . EKG 12-Lead  . ED EKG  . ED EKG      Management plans discussed with the patient, family and they are in agreement.  CODE STATUS:     Code Status Orders        Start     Ordered   05/14/15 0256  Full code   Continuous     05/14/15 0255    Code Status History    Date Active Date Inactive Code Status Order ID Comments User Context   This patient has a current code status but no historical code status.      TOTAL TIME TAKING CARE OF THIS PATIENT: 37 minutes.    Gladstone Lighter M.D on 05/15/2015 at 2:05 PM  Between 7am to 6pm - Pager - [phone]  After 6pm go to [email or web address] - password [email or web address] [facility]  Office  [phone]  CC: Primary care physician; Deborha Payment, MD

## 2015-05-15 NOTE — Plan of Care (Signed)
Problem: Education: Goal: Knowledge of [facility] Education information/materials will improve Outcome: Progressing See assessment, plan for EEG as outpt. Order noted in system.  Problem: Safety: Goal: Ability to remain free from injury will improve Outcome: Progressing Bed alarm set, pt enc to call for assistance with activity oob.   Problem: Physical Regulation: Goal: Ability to maintain clinical measurements within normal limits will improve Outcome: Progressing Pt amb to bathroom with supervision.

## 2015-05-15 NOTE — Discharge Instructions (Signed)
Seizure, Adult A seizure is abnormal electrical activity in the brain. Seizures usually last from 30 seconds to 2 minutes. There are various types of seizures. Before a seizure, you may have a warning sensation (aura) that a seizure is about to occur. An aura may include the following symptoms:   Fear or anxiety.  Nausea.  Feeling like the room is spinning (vertigo).  Vision changes, such as seeing flashing lights or spots. Common symptoms during a seizure include:  A change in attention or behavior (altered mental status).  Convulsions with rhythmic jerking movements.  Drooling.  Rapid eye movements.  Grunting.  Loss of bladder and bowel control.  Bitter taste in the mouth.  Tongue biting. After a seizure, you may feel confused and sleepy. You may also have an injury resulting from convulsions during the seizure. HOME CARE INSTRUCTIONS   If you are given medicines, take them exactly as prescribed by your health care provider.  Keep all follow-up appointments as directed by your health care provider.  Do not swim or drive or engage in risky activity during which a seizure could cause further injury to you or others until your health care provider says it is OK.  Get adequate rest.  Teach friends and family what to do if you have a seizure. They should:  Lay you on the ground to prevent a fall.  Put a cushion under your head.  Loosen any tight clothing around your neck.  Turn you on your side. If vomiting occurs, this helps keep your airway clear.  Stay with you until you recover.  Know whether or not you need emergency care. SEEK IMMEDIATE MEDICAL CARE IF:  The seizure lasts longer than 5 minutes.  The seizure is severe or you do not wake up immediately after the seizure.  You have an altered mental status after the seizure.  You are having more frequent or worsening seizures. Someone should drive you to the emergency department or call local emergency  services (911 in [place] MAKE SURE YOU:  Understand these instructions.  Will watch your condition.  Will get help right away if you are not doing well or get worse.   1.  No driving   This information is not intended to replace advice given to you by your health care provider. Make sure you discuss any questions you have with your health care provider.   Document Released: 02/10/2000 Document Revised: 03/05/2014 Document Reviewed: 09/24/2012 Elsevier Interactive Patient Education [employer].  Please return immediately if condition worsens. Please contact her primary physician or the physician you were given for referral. If you have any specialist physicians involved in her treatment and plan please also contact them. Thank you for using [place] [facility].

## 2015-05-16 ENCOUNTER — Telehealth: Payer: Self-pay | Admitting: Cardiovascular Disease

## 2015-05-16 NOTE — Telephone Encounter (Signed)
Patient's niece called and stated that her aunt had a two seizures this week. She took her to [facility] ED. Sandra Charles feels its coming from the medicine carvedilol (COREG) 12.5 MG tablet. Please call patient's niece Sandra Charles at [phone].

## 2015-05-16 NOTE — Telephone Encounter (Signed)
S/w pt niece, Sandra Charles, who reports pt admitted to [facility] w/seizure activity. Upon d/c, coreg refill given however, pt does not want to take it because she thinks this is what caused seizure. Sandra Charles called asking if pt should continue coreg. Advised her to have pt take all medications as prescribed and to keep f/u w/neurology and PCP and EEG in one week. Offered appt w/Dr. Fletcher Anon if they would like to f/u. Niece verbalized understanding, is agreeable w/plan and will call if pt requests appt.

## 2015-07-12 ENCOUNTER — Encounter: Payer: Self-pay | Admitting: Emergency Medicine

## 2015-07-12 ENCOUNTER — Emergency Department: Payer: Medicaid Other

## 2015-07-12 ENCOUNTER — Inpatient Hospital Stay
Admission: EM | Admit: 2015-07-12 | Discharge: 2015-07-15 | DRG: 098 | Disposition: A | Discharge status: Other Acute Inpatient Hospital | Payer: Medicaid Other | Attending: Internal Medicine | Admitting: Internal Medicine

## 2015-07-12 DIAGNOSIS — E876 Hypokalemia: Secondary | ICD-10-CM

## 2015-07-12 DIAGNOSIS — Z803 Family history of malignant neoplasm of breast: Secondary | ICD-10-CM | POA: Diagnosis not present

## 2015-07-12 DIAGNOSIS — R410 Disorientation, unspecified: Secondary | ICD-10-CM | POA: Insufficient documentation

## 2015-07-12 DIAGNOSIS — Z7982 Long term (current) use of aspirin: Secondary | ICD-10-CM

## 2015-07-12 DIAGNOSIS — Z79899 Other long term (current) drug therapy: Secondary | ICD-10-CM | POA: Diagnosis not present

## 2015-07-12 DIAGNOSIS — E785 Hyperlipidemia, unspecified: Secondary | ICD-10-CM | POA: Diagnosis present

## 2015-07-12 DIAGNOSIS — Z8673 Personal history of transient ischemic attack (TIA), and cerebral infarction without residual deficits: Secondary | ICD-10-CM

## 2015-07-12 DIAGNOSIS — F05 Delirium due to known physiological condition: Secondary | ICD-10-CM | POA: Diagnosis not present

## 2015-07-12 DIAGNOSIS — D72829 Elevated white blood cell count, unspecified: Secondary | ICD-10-CM

## 2015-07-12 DIAGNOSIS — F329 Major depressive disorder, single episode, unspecified: Secondary | ICD-10-CM | POA: Diagnosis present

## 2015-07-12 DIAGNOSIS — I674 Hypertensive encephalopathy: Secondary | ICD-10-CM | POA: Diagnosis present

## 2015-07-12 DIAGNOSIS — G9341 Metabolic encephalopathy: Secondary | ICD-10-CM | POA: Diagnosis present

## 2015-07-12 DIAGNOSIS — Z8249 Family history of ischemic heart disease and other diseases of the circulatory system: Secondary | ICD-10-CM | POA: Diagnosis not present

## 2015-07-12 DIAGNOSIS — E872 Acidosis: Secondary | ICD-10-CM | POA: Diagnosis present

## 2015-07-12 DIAGNOSIS — F419 Anxiety disorder, unspecified: Secondary | ICD-10-CM | POA: Diagnosis present

## 2015-07-12 DIAGNOSIS — E538 Deficiency of other specified B group vitamins: Secondary | ICD-10-CM | POA: Diagnosis present

## 2015-07-12 DIAGNOSIS — G0490 Encephalitis and encephalomyelitis, unspecified: Secondary | ICD-10-CM | POA: Diagnosis present

## 2015-07-12 DIAGNOSIS — K219 Gastro-esophageal reflux disease without esophagitis: Secondary | ICD-10-CM | POA: Diagnosis present

## 2015-07-12 DIAGNOSIS — Z87891 Personal history of nicotine dependence: Secondary | ICD-10-CM | POA: Diagnosis not present

## 2015-07-12 DIAGNOSIS — I10 Essential (primary) hypertension: Secondary | ICD-10-CM | POA: Diagnosis present

## 2015-07-12 LAB — CK: Total CK: 137 U/L (ref 38–234)

## 2015-07-12 LAB — URINALYSIS COMPLETE WITH MICROSCOPIC (ARMC ONLY)
BACTERIA UA: NONE SEEN
Bilirubin Urine: NEGATIVE
Glucose, UA: >500 mg/dL — AB
Leukocytes, UA: NEGATIVE
Nitrite: NEGATIVE
PH: 8 (ref 5.0–8.0)
SQUAMOUS EPITHELIAL / LPF: NONE SEEN
Specific Gravity, Urine: 1.011 (ref 1.005–1.030)

## 2015-07-12 LAB — COMPREHENSIVE METABOLIC PANEL
ALBUMIN: 5.1 g/dL — AB (ref 3.5–5.0)
ALT: 34 U/L (ref 14–54)
AST: 32 U/L (ref 15–41)
Alkaline Phosphatase: 106 U/L (ref 38–126)
Anion gap: 12 (ref 5–15)
BILIRUBIN TOTAL: 0.6 mg/dL (ref 0.3–1.2)
BUN: 11 mg/dL (ref 6–20)
CO2: 27 mmol/L (ref 22–32)
Calcium: 9.4 mg/dL (ref 8.9–10.3)
Chloride: 93 mmol/L — ABNORMAL LOW (ref 101–111)
Creatinine, Ser: 0.64 mg/dL (ref 0.44–1.00)
GFR calc Af Amer: >60 mL/min (ref >60)
GFR calc non Af Amer: >60 mL/min (ref >60)
GLUCOSE: 187 mg/dL — AB (ref 65–99)
POTASSIUM: 2.7 mmol/L — AB (ref 3.5–5.1)
Sodium: 132 mmol/L — ABNORMAL LOW (ref 135–145)
TOTAL PROTEIN: 9.5 g/dL — AB (ref 6.5–8.1)

## 2015-07-12 LAB — CBC
HEMATOCRIT: 43.1 % (ref 35.0–47.0)
Hemoglobin: 14.3 g/dL (ref 12.0–16.0)
MCH: 28.2 pg (ref 26.0–34.0)
MCHC: 33.2 g/dL (ref 32.0–36.0)
MCV: 84.8 fL (ref 80.0–100.0)
Platelets: 220 10*3/uL (ref 150–440)
RBC: 5.08 MIL/uL (ref 3.80–5.20)
RDW: 14.6 % — AB (ref 11.5–14.5)
WBC: 19.5 10*3/uL — ABNORMAL HIGH (ref 3.6–11.0)

## 2015-07-12 LAB — LACTIC ACID, PLASMA
Lactic Acid, Venous: 1.5 mmol/L (ref 0.5–2.0)
Lactic Acid, Venous: 2.1 mmol/L (ref 0.5–2.0)

## 2015-07-12 LAB — SEDIMENTATION RATE: SED RATE: 6 mm/h (ref 0–30)

## 2015-07-12 LAB — TSH: TSH: 2.515 u[IU]/mL (ref 0.350–4.500)

## 2015-07-12 LAB — T4, FREE: Free T4: 0.78 ng/dL (ref 0.61–1.12)

## 2015-07-12 LAB — GLUCOSE, CAPILLARY: Glucose-Capillary: 190 mg/dL — ABNORMAL HIGH (ref 65–99)

## 2015-07-12 MED ORDER — POTASSIUM CHLORIDE CRYS ER 20 MEQ PO TBCR
EXTENDED_RELEASE_TABLET | ORAL | Status: AC
  Administered 2015-07-12: 40 meq via ORAL
  Filled 2015-07-12: qty 2

## 2015-07-12 MED ORDER — VANCOMYCIN HCL 10 G IV SOLR
1250.0000 mg | Freq: Once | INTRAVENOUS | Status: AC
  Administered 2015-07-12: 1250 mg via INTRAVENOUS
  Filled 2015-07-12 (×2): qty 1250

## 2015-07-12 MED ORDER — VANCOMYCIN HCL 10 G IV SOLR
1250.0000 mg | Freq: Two times a day (BID) | INTRAVENOUS | Status: DC
  Administered 2015-07-13 – 2015-07-15 (×5): 1250 mg via INTRAVENOUS
  Filled 2015-07-12 (×7): qty 1250

## 2015-07-12 MED ORDER — CITALOPRAM HYDROBROMIDE 20 MG PO TABS
40.0000 mg | ORAL_TABLET | Freq: Every day | ORAL | Status: DC
  Administered 2015-07-12 – 2015-07-15 (×4): 40 mg via ORAL
  Filled 2015-07-12 (×4): qty 2

## 2015-07-12 MED ORDER — LABETALOL HCL 5 MG/ML IV SOLN
20.0000 mg | Freq: Once | INTRAVENOUS | Status: AC
  Administered 2015-07-12: 20 mg via INTRAVENOUS
  Filled 2015-07-12: qty 4

## 2015-07-12 MED ORDER — POTASSIUM CHLORIDE CRYS ER 20 MEQ PO TBCR
40.0000 meq | EXTENDED_RELEASE_TABLET | Freq: Once | ORAL | Status: AC
  Administered 2015-07-12: 40 meq via ORAL

## 2015-07-12 MED ORDER — HYDROCHLOROTHIAZIDE 25 MG PO TABS
25.0000 mg | ORAL_TABLET | Freq: Every day | ORAL | Status: DC
  Administered 2015-07-12 – 2015-07-13 (×2): 25 mg via ORAL
  Filled 2015-07-12 (×2): qty 1

## 2015-07-12 MED ORDER — AMLODIPINE BESYLATE 10 MG PO TABS
10.0000 mg | ORAL_TABLET | Freq: Every day | ORAL | Status: DC
  Administered 2015-07-12 – 2015-07-15 (×4): 10 mg via ORAL
  Filled 2015-07-12 (×5): qty 1

## 2015-07-12 MED ORDER — ACETAMINOPHEN 325 MG PO TABS
650.0000 mg | ORAL_TABLET | Freq: Four times a day (QID) | ORAL | Status: DC | PRN
  Administered 2015-07-12: 650 mg via ORAL
  Filled 2015-07-12: qty 2

## 2015-07-12 MED ORDER — PRAVASTATIN SODIUM 20 MG PO TABS: 40.0000 mg | ORAL_TABLET | Freq: Every day | ORAL | Status: DC

## 2015-07-12 MED ORDER — ONDANSETRON HCL 4 MG PO TABS: 4.0000 mg | ORAL_TABLET | Freq: Four times a day (QID) | ORAL | Status: DC | PRN

## 2015-07-12 MED ORDER — CARVEDILOL 25 MG PO TABS
25.0000 mg | ORAL_TABLET | Freq: Two times a day (BID) | ORAL | Status: DC
  Administered 2015-07-13 – 2015-07-15 (×5): 25 mg via ORAL
  Filled 2015-07-12 (×5): qty 1

## 2015-07-12 MED ORDER — ALPRAZOLAM 1 MG PO TABS: 1.0000 mg | ORAL_TABLET | Freq: Three times a day (TID) | ORAL | Status: DC | PRN

## 2015-07-12 MED ORDER — BENZOCAINE 10 % MT GEL
1.0000 "application " | OROMUCOSAL | Status: DC | PRN
  Filled 2015-07-12: qty 9.4

## 2015-07-12 MED ORDER — ONDANSETRON HCL 4 MG/2ML IJ SOLN: 4.0000 mg | Freq: Four times a day (QID) | INTRAMUSCULAR | Status: DC | PRN

## 2015-07-12 MED ORDER — ACETAMINOPHEN 650 MG RE SUPP
650.0000 mg | Freq: Four times a day (QID) | RECTAL | Status: DC | PRN
  Administered 2015-07-12: 650 mg via RECTAL
  Filled 2015-07-12: qty 1

## 2015-07-12 MED ORDER — MAGNESIUM SULFATE 2 GM/50ML IV SOLN
2.0000 g | Freq: Once | INTRAVENOUS | Status: AC
  Administered 2015-07-12: 2 g via INTRAVENOUS

## 2015-07-12 MED ORDER — MORPHINE SULFATE (PF) 2 MG/ML IV SOLN: 2.0000 mg | INTRAVENOUS | Status: DC | PRN

## 2015-07-12 MED ORDER — SODIUM CHLORIDE 0.9 % IV BOLUS (SEPSIS)
1000.0000 mL | Freq: Once | INTRAVENOUS | Status: AC
  Administered 2015-07-12: 1000 mL via INTRAVENOUS

## 2015-07-12 MED ORDER — ASPIRIN EC 81 MG PO TBEC
81.0000 mg | DELAYED_RELEASE_TABLET | Freq: Every day | ORAL | Status: DC
  Administered 2015-07-12 – 2015-07-15 (×4): 81 mg via ORAL
  Filled 2015-07-12 (×4): qty 1

## 2015-07-12 MED ORDER — VITAMIN D 1000 UNITS PO TABS
5000.0000 [IU] | ORAL_TABLET | Freq: Every day | ORAL | Status: DC
  Administered 2015-07-12 – 2015-07-15 (×3): 5000 [IU] via ORAL
  Filled 2015-07-12 (×4): qty 5

## 2015-07-12 MED ORDER — OXYCODONE HCL 5 MG PO TABS: 5.0000 mg | ORAL_TABLET | ORAL | Status: DC | PRN

## 2015-07-12 MED ORDER — HYDRALAZINE HCL 20 MG/ML IJ SOLN: 10.0000 mg | INTRAMUSCULAR | Status: DC | PRN

## 2015-07-12 MED ORDER — ENOXAPARIN SODIUM 40 MG/0.4ML ~~LOC~~ SOLN
40.0000 mg | SUBCUTANEOUS | Status: DC
  Administered 2015-07-12 – 2015-07-13 (×2): 40 mg via SUBCUTANEOUS
  Filled 2015-07-12: qty 0.4

## 2015-07-12 MED ORDER — PANTOPRAZOLE SODIUM 40 MG PO TBEC
40.0000 mg | DELAYED_RELEASE_TABLET | Freq: Every day | ORAL | Status: DC
  Administered 2015-07-12 – 2015-07-15 (×4): 40 mg via ORAL
  Filled 2015-07-12 (×4): qty 1

## 2015-07-12 MED ORDER — MAGNESIUM SULFATE 2 GM/50ML IV SOLN
INTRAVENOUS | Status: AC
  Administered 2015-07-12: 2 g via INTRAVENOUS
  Filled 2015-07-12: qty 50

## 2015-07-12 MED ORDER — LEVETIRACETAM 500 MG PO TABS
500.0000 mg | ORAL_TABLET | Freq: Two times a day (BID) | ORAL | Status: DC
  Administered 2015-07-13 (×2): 500 mg via ORAL
  Filled 2015-07-12 (×2): qty 1

## 2015-07-12 MED ORDER — PIPERACILLIN-TAZOBACTAM 3.375 G IVPB
3.3750 g | Freq: Three times a day (TID) | INTRAVENOUS | Status: DC
  Administered 2015-07-12 – 2015-07-15 (×8): 3.375 g via INTRAVENOUS
  Filled 2015-07-12 (×11): qty 50

## 2015-07-12 MED ORDER — ENOXAPARIN SODIUM 40 MG/0.4ML ~~LOC~~ SOLN
SUBCUTANEOUS | Status: AC
  Administered 2015-07-12: 40 mg via SUBCUTANEOUS
  Filled 2015-07-12: qty 0.4

## 2015-07-12 MED ORDER — SODIUM CHLORIDE 0.9% FLUSH
3.0000 mL | Freq: Two times a day (BID) | INTRAVENOUS | Status: DC
  Administered 2015-07-12 – 2015-07-15 (×7): 3 mL via INTRAVENOUS

## 2015-07-12 NOTE — Progress Notes (Signed)
Pt had fever, Confusion, elevated WBCs, lactic acidosis.  Assessment    * Sepsis   Unknown sourse.   UA and Xray chest negatvie   May be Aseptic meningitis?   Or some other source.    IV vanc + zosyn for now   Get neurology consult- as she is also on seizure meds, and have staring spells.

## 2015-07-12 NOTE — Progress Notes (Addendum)
Patient admitted to unit. Attempted to orient to room, call bell, and staff. Bed in lowest position. Fall safety plan reviewed. Full assessment to Epic. Skin assessment verified with Vernie Shanks RN. Telemetry box verification with tele clerk an Milnor NT- Box#: [id number]. Will continue to monitor. Patient not oriented enough to answer admission questions - completed as much as possible niece, [name]. Per [name], patient has not taken any of her daily medicines, will want to try to take them after dinner. (Rescheduled to 2000 so night shift will be able to see them on worklist/MAR).

## 2015-07-12 NOTE — ED Notes (Signed)
Pt to ed with altered mental status this am when she awoke.  Pt reports headache, asking repeated questions.  Pt moving all extremities well.  Pt alert but oriented to self only.

## 2015-07-12 NOTE — ED Notes (Signed)
CRITICAL VALUE ALERT  Critical value received:  K 2.7  Date of notification:  07/12/2015  Time of notification:  [id number]  Critical value read back:Yes.    Nurse who received alert:  Seva Chancy  MD notified (1st page):  stafford  Time of first page:  56   MD notified (2nd page):  Time of second page:  Responding MD:  stafford  Time MD responded:  1513

## 2015-07-12 NOTE — Progress Notes (Signed)
ANTIBIOTIC CONSULT NOTE - INITIAL  Pharmacy Consult for Vancomycin , Zosyn  Indication: sepsis  No Known Allergies  Patient Measurements: Height: 5\' 4"  (162.6 cm) (stated) Weight: 155 lb 9.6 oz (70.58 kg) IBW/kg (Calculated) : 54.7 Adjusted Body Weight: 61.06 kg   Vital Signs: Temp: 101 F (38.3 C) (05/16 1942) Temp Source: Oral (05/16 1942) BP: 176/76 mmHg (05/16 1942) Pulse Rate: 87 (05/16 1942) Intake/Output from previous day:   Intake/Output from this shift: Total I/O In: -  Out: 500 [Urine:500]  Labs:  Recent Labs  07/12/15 1335  WBC 19.5*  HGB 14.3  PLT 220  CREATININE 0.64   Estimated Creatinine Clearance: 74.8 mL/min (by C-G formula based on Cr of 0.64). No results for input(s): VANCOTROUGH, VANCOPEAK, VANCORANDOM, GENTTROUGH, GENTPEAK, GENTRANDOM, TOBRATROUGH, TOBRAPEAK, TOBRARND, AMIKACINPEAK, AMIKACINTROU, AMIKACIN in the last 72 hours.   Microbiology: No results found for this or any previous visit (from the past 720 hour(s)).  Medical History: Past Medical History  Diagnosis Date  . Hypertension   . Anxiety   . Depression   . Mini stroke ([place])   . Hyperlipidemia     Medications:  Prescriptions prior to admission  Medication Sig Dispense Refill Last Dose  . ALPRAZolam (XANAX) 1 MG tablet Take 1 mg by mouth 3 (three) times daily as needed for anxiety.   07/11/2015 at Unknown time  . amLODipine (NORVASC) 10 MG tablet Take 10 mg by mouth daily.   07/11/2015 at Unknown time  . aspirin EC 81 MG tablet Take 81 mg by mouth daily.    07/11/2015 at [place]   . benzocaine (ORAJEL) 10 % mucosal gel Use as directed 1 application in the mouth or throat as needed (for tongue ulcers/lacerations).   Past Week at Unknown time  . carvedilol (COREG) 25 MG tablet Take 25 mg by mouth 2 (two) times daily with a meal.   07/11/2015 at [facility]   . Cholecalciferol (VITAMIN D3) 5000 UNITS TABS Take 5,000 Units by mouth daily.   07/11/2015 at Unknown time  . citalopram (CELEXA)  40 MG tablet Take 40 mg by mouth daily.   07/11/2015 at Unknown time  . hydrochlorothiazide (HYDRODIURIL) 25 MG tablet Take 25 mg by mouth daily.   07/11/2015 at Unknown time  . HYDROcodone-acetaminophen (NORCO) 5-325 MG tablet Take 1 tablet by mouth every 6 (six) hours as needed for moderate pain. 20 tablet 0 07/11/2015 at [place]   . levETIRAcetam (KEPPRA) 500 MG tablet Take 1 tablet (500 mg total) by mouth 2 (two) times daily. 60 tablet 2 07/11/2015 at Unknown time  . lovastatin (MEVACOR) 40 MG tablet Take 40 mg by mouth at bedtime.    07/11/2015 at Unknown time  . pantoprazole (PROTONIX) 40 MG tablet Take 1 tablet (40 mg total) by mouth daily. 30 tablet 11 07/11/2015 at Unknown time  . QUEtiapine (SEROQUEL) 100 MG tablet Take 200 mg by mouth at bedtime.   07/11/2015 at Unknown time   Assessment: CrCl = 74.8 ml/min Ke =  0.082 hr-1 T1/2 = 8.45 hrs Vd = 49.4 L   No Pseudomonas risk factors noted.   Goal of Therapy:  Vancomycin trough level 15-20 mcg/ml  Plan:  Expected duration 7 days with resolution of temperature and/or normalization of WBC   Vancomycin 1250 mg IV X 1 ordered to be given on 5/16 @ 22:00. Vancomycin 1250 mg IV Q12H ordered to start on 5/17 @ 5:00, ~ 7 hrs after 1st dose (stacked dosing). This pt will reach C[facility] by  5/18 @ 16:00. Will draw 1st trough on 5/18 @ 16:30, which will be at [facility].   Zosyn 3.375 gm IV Q8H EI.   Tykee Heideman D 07/12/2015,9:19 PM

## 2015-07-12 NOTE — ED Provider Notes (Signed)
[facility] Emergency Department Provider Note  ____________________________________________  Time seen: 2:20 PM  I have reviewed the triage vital signs and the nursing notes.   HISTORY  Chief Complaint Altered Mental Status Level 5 caveat:  Portions of the history and physical were unable to be obtained due to the patient's acute illness    HPI Sandra Charles is a 60 y.o. female brought to the ED by family due to altered mental status. Patient was noted to be very confused when she awoke. She reports a headache in the left parietal area. Denies any other symptoms.  Family unable to relate any other symptoms. They note that the patient has had trouble with very high blood pressures recently causing episodes of confusion. She's been compliant with her medications. Last known well time was 10 PM last night. Initially this morning when she is confused blood pressure seemed to be normal when she was first evaluated by EMS at the house.     Past Medical History  Diagnosis Date  . Hypertension   . Anxiety   . Depression   . Mini stroke ([place])   . Hyperlipidemia      Patient Active Problem List   Diagnosis Date Noted  . Altered mental status 05/14/2015  . Atypical chest pain 11/25/2014  . GERD (gastroesophageal reflux disease) 11/25/2014  . Essential hypertension 11/25/2014     Past Surgical History  Procedure Laterality Date  . Vaginal hysterectomy       Current Outpatient Rx  Name  Route  Sig  Dispense  Refill  . ALPRAZolam (XANAX) 1 MG tablet   Oral   Take 1 tablet (1 mg total) by mouth 2 (two) times daily as needed for anxiety.   10 tablet   0   . amLODipine (NORVASC) 10 MG tablet   Oral   Take 1 tablet (10 mg total) by mouth daily. for high blood pressure   30 tablet   2   . aspirin EC 81 MG tablet   Oral   Take 81 mg by mouth every morning.         . benzocaine (ORAJEL) 10 % mucosal gel   Mouth/Throat   Use as directed in the  mouth or throat 4 (four) times daily as needed for mouth pain (to tongue ulcers/lacerations).   5.3 g   0   . carvedilol (COREG) 12.5 MG tablet   Oral   Take 1 tablet (12.5 mg total) by mouth 2 (two) times daily with a meal.   60 tablet   11   . Cholecalciferol (VITAMIN D3) 5000 UNITS TABS   Oral   Take 5,000 Units by mouth daily.         . citalopram (CELEXA) 40 MG tablet   Oral   Take 40 mg by mouth daily.         Marland Kitchen HYDROcodone-acetaminophen (NORCO) 5-325 MG tablet   Oral   Take 1 tablet by mouth every 6 (six) hours as needed for moderate pain.   20 tablet   0   . levETIRAcetam (KEPPRA) 500 MG tablet   Oral   Take 1 tablet (500 mg total) by mouth 2 (two) times daily.   60 tablet   2   . lovastatin (MEVACOR) 20 MG tablet   Oral   Take 1 tablet (20 mg total) by mouth at bedtime.   30 tablet   2   . pantoprazole (PROTONIX) 40 MG tablet   Oral  Take 1 tablet (40 mg total) by mouth daily.   30 tablet   11   . QUEtiapine (SEROQUEL) 100 MG tablet   Oral   Take 200 mg by mouth at bedtime.            Allergies Review of patient's allergies indicates no known allergies.   Family History  Problem Relation Age of Onset  . Hyperlipidemia Mother   . Breast cancer Mother 54  . Hypertension Mother   . Heart attack Mother     age 76's  . Breast cancer Sister 42  . Hyperlipidemia Father   . Hypertension Father   . Heart attack Father 67    Social History Social History  Substance Use Topics  . Smoking status: Former Smoker -- 1.00 packs/day for 5 years    Types: Cigarettes  . Smokeless tobacco: None  . Alcohol Use: No    Review of Systems Unable to obtain in full due to altered mental status Positive for headaches ____________________________________________   PHYSICAL EXAM:  VITAL SIGNS: ED Triage Vitals  Enc Vitals Group     BP 07/12/15 1409 232/114 mmHg     Pulse Rate 07/12/15 1409 97     Resp 07/12/15 1409 20     Temp 07/12/15 1409  98.4 F (36.9 C)     Temp Source 07/12/15 1409 Oral     SpO2 07/12/15 1409 94 %     Weight 07/12/15 1409 153 lb (69.4 kg)     Height 07/12/15 1409 '5\' 4"'  (1.626 m)     Head Cir --      Peak Flow --      Pain Score --      Pain Loc --      Pain Edu? --      Excl. in [place]? --     Vital signs reviewed, nursing assessments reviewed.   Constitutional:   Alert and oriented To self. Well appearing and in no distress. Eyes:   No scleral icterus. No conjunctival pallor. PERRL. EOMI.  No nystagmus. ENT   Head:   Normocephalic and atraumatic.   Nose:   No congestion/rhinnorhea. No septal hematoma   Mouth/Throat:   MMM, no pharyngeal erythema. No peritonsillar mass.    Neck:   No stridor. No SubQ emphysema. No meningismus. Hematological/Lymphatic/Immunilogical:   No cervical lymphadenopathy. Cardiovascular:   RRR. Symmetric bilateral radial and DP pulses.  No murmurs.  Respiratory:   Normal respiratory effort without tachypnea nor retractions. Breath sounds are clear and equal bilaterally. No wheezes/rales/rhonchi. Gastrointestinal:   Soft and nontender. Non distended. There is no CVA tenderness.  No rebound, rigidity, or guarding. Genitourinary:   deferred Musculoskeletal:   Nontender with normal range of motion in all extremities. No joint effusions.  No lower extremity tenderness.  No edema. Neurologic:   Normal speech and language.  CN 2-10 normal. Motor grossly intact. No gross focal neurologic deficits are appreciated.  Skin:    Skin is warm, dry and intact. No rash noted.  No petechiae, purpura, or bullae.  ____________________________________________    LABS (pertinent positives/negatives) (all labs ordered are listed, but only abnormal results are displayed) Labs Reviewed  COMPREHENSIVE METABOLIC PANEL - Abnormal; Notable for the following:    Sodium 132 (*)    Potassium 2.7 (*)    Chloride 93 (*)    Glucose, Bld 187 (*)    Total Protein 9.5 (*)    Albumin 5.1  (*)    All other components within  normal limits  CBC - Abnormal; Notable for the following:    WBC 19.5 (*)    RDW 14.6 (*)    All other components within normal limits  GLUCOSE, CAPILLARY - Abnormal; Notable for the following:    Glucose-Capillary 190 (*)    All other components within normal limits  URINALYSIS COMPLETEWITH MICROSCOPIC ([facility] ONLY)  CBG MONITORING, ED   ____________________________________________   EKG  Interpreted by me Sinus tachycardia rate 102, normal axis and intervals. Normal QRS ST segments and T waves  ____________________________________________    RADIOLOGY  CT head unremarkable Chest x-ray Unremarkable  ____________________________________________   PROCEDURES CRITICAL CARE Performed by: Joni Fears, Renan Danese   Total critical care time: 35 minutes  Critical care time was exclusive of separately billable procedures and treating other patients.  Critical care was necessary to treat or prevent imminent or life-threatening deterioration.  Critical care was time spent personally by me on the following activities: development of treatment plan with patient and/or surrogate as well as nursing, discussions with consultants, evaluation of patient's response to treatment, examination of patient, obtaining history from patient or surrogate, ordering and performing treatments and interventions, ordering and review of laboratory studies, ordering and review of radiographic studies, pulse oximetry and re-evaluation of patient's condition.   ____________________________________________   INITIAL IMPRESSION / ASSESSMENT AND PLAN / ED COURSE  Pertinent labs & imaging results that were available during my care of the patient were reviewed by me and considered in my medical decision making (see chart for details).  Patient presents with left-sided headache and altered mental status, found to be severely hypertensive with blood pressure about 230/115. We'll  give IV labetalol for acute blood pressure lowering in the setting of what appears to be malignant hypertension. She is also hypokalemic at 2.7, and if she is able to tolerate oral intake we will give potassium repletion. She also has a leukocytosis of 19,000. We'll follow up on urinalysis and chest x-ray to evaluate for a further source. I'll add an ESR to evaluate for broad arteritis.  At 1515, blood pressure slightly improved to 200/80. We'll give further labetalol boluses until we can achieve a systolic under 786.   ----------------------------------------- 4:10 PM on 07/12/2015 -----------------------------------------  ESR normal, chest x-ray normal. Awaiting urinalysis results. We'll discussed with hospitalist for admission for malignant hypertension and hypertensive encephalopathy.  ____________________________________________   FINAL CLINICAL IMPRESSION(S) / ED DIAGNOSES  Final diagnoses:  Confusion  Hypokalemia  Malignant hypertension  Leukocytosis       Portions of this note were generated with dragon dictation software. Dictation errors may occur despite best attempts at proofreading.   Carrie Mew, MD 07/12/15 [phone]

## 2015-07-12 NOTE — Progress Notes (Signed)
New order from Dr. Anselm Jungling and Dr. Lavetta Nielsen for Vancoumycin and Zosyn IV as well as Neuro consult. Patient was able to take her meidactions oral in apple source. Sister at bedside and she was updated on  the new orders. Will continue to monitor

## 2015-07-12 NOTE — H&P (Signed)
[facility] NAME: Sandra Charles    MR#:  [id number]  DATE OF BIRTH:  Jun 29, 1955   DATE OF ADMISSION:  07/12/2015  PRIMARY CARE PHYSICIAN: Deborha Payment, MD   REQUESTING/REFERRING PHYSICIAN: Joni Fears  CHIEF COMPLAINT:   Chief Complaint  Patient presents with  . Altered Mental Status    HISTORY OF PRESENT ILLNESS:  Sandra Charles  is a 60 y.o. female with a known history of Essential hypertension who is presenting with altered mental status. The patient is unable to provide any meaningful information given mental status history obtained from family members present at bedside. They described one day duration of altered mental status mainly described as confusion. No preceding symptoms or illnesses. Of note patient here approximately 2 months ago with similar symptoms at that time believed to have a seizure MRI was negative noted high blood pressure PAST MEDICAL HISTORY:   Past Medical History  Diagnosis Date  . Hypertension   . Anxiety   . Depression   . Mini stroke ([place])   . Hyperlipidemia     PAST SURGICAL HISTORY:   Past Surgical History  Procedure Laterality Date  . Vaginal hysterectomy      SOCIAL HISTORY:   Social History  Substance Use Topics  . Smoking status: Former Smoker -- 1.00 packs/day for 5 years    Types: Cigarettes  . Smokeless tobacco: Not on file  . Alcohol Use: No    FAMILY HISTORY:   Family History  Problem Relation Age of Onset  . Hyperlipidemia Mother   . Breast cancer Mother 35  . Hypertension Mother   . Heart attack Mother     age 21's  . Breast cancer Sister 63  . Hyperlipidemia Father   . Hypertension Father   . Heart attack Father 42    DRUG ALLERGIES:  No Known Allergies  REVIEW OF SYSTEMS:  Unreliable given patient's mental status   MEDICATIONS AT HOME:   Prior to Admission medications   Medication Sig Start Date End Date Taking? Authorizing Provider    ALPRAZolam Duanne Moron) 1 MG tablet Take 1 mg by mouth 3 (three) times daily as needed for anxiety.   Yes Historical Provider, MD  amLODipine (NORVASC) 10 MG tablet Take 10 mg by mouth daily.   Yes Historical Provider, MD  aspirin EC 81 MG tablet Take 81 mg by mouth daily.    Yes Historical Provider, MD  benzocaine (ORAJEL) 10 % mucosal gel Use as directed 1 application in the mouth or throat as needed (for tongue ulcers/lacerations).   Yes Historical Provider, MD  carvedilol (COREG) 25 MG tablet Take 25 mg by mouth 2 (two) times daily with a meal.   Yes Historical Provider, MD  Cholecalciferol (VITAMIN D3) 5000 UNITS TABS Take 5,000 Units by mouth daily.   Yes Historical Provider, MD  citalopram (CELEXA) 40 MG tablet Take 40 mg by mouth daily.   Yes Historical Provider, MD  hydrochlorothiazide (HYDRODIURIL) 25 MG tablet Take 25 mg by mouth daily.   Yes Historical Provider, MD  HYDROcodone-acetaminophen (NORCO) 5-325 MG tablet Take 1 tablet by mouth every 6 (six) hours as needed for moderate pain. 05/14/15  Yes Daymon Larsen, MD  levETIRAcetam (KEPPRA) 500 MG tablet Take 1 tablet (500 mg total) by mouth 2 (two) times daily. 05/15/15  Yes Gladstone Lighter, MD  lovastatin (MEVACOR) 40 MG tablet Take 40 mg by mouth at bedtime.    Yes Historical Provider, MD  pantoprazole (PROTONIX) 40 MG tablet Take 1 tablet (40 mg total) by mouth daily. 11/25/14  Yes Wellington Hampshire, MD  QUEtiapine (SEROQUEL) 100 MG tablet Take 200 mg by mouth at bedtime.   Yes Historical Provider, MD      VITAL SIGNS:  Blood pressure 185/91, pulse 91, temperature 100.9 F (38.3 C), temperature source Oral, resp. rate 27, height 5\' 4"  (1.626 m), weight 153 lb (69.4 kg), SpO2 95 %.  PHYSICAL EXAMINATION:  VITAL SIGNS: Filed Vitals:   07/12/15 1634 07/12/15 1700  BP:  185/91  Pulse:  91  Temp: 100.9 F (38.3 C)   Resp:  72   GENERAL:59 y.o.female currently in Moderate acute distress.  HEAD: Normocephalic, atraumatic.  EYES:  Pupils equal, dilated round, minimally reactive to light. Extraocular muscles intact. No scleral icterus.  MOUTH: Moist mucosal membrane. Dentition intact. No abscess noted.  EAR, NOSE, THROAT: Clear without exudates. No external lesions.  NECK: Supple with passive full range of motion. No thyromegaly. No nodules. No JVD.  PULMONARY: Clear to ascultation, without wheeze rails or rhonci. No use of accessory muscles, Good respiratory effort. good air entry bilaterally CHEST: Nontender to palpation.  CARDIOVASCULAR: S1 and S2. Tachycardic. No murmurs, rubs, or gallops. No edema. Pedal pulses 2+ bilaterally.  GASTROINTESTINAL: Soft, nontender, nondistended. No masses. Positive bowel sounds. No hepatosplenomegaly.  MUSCULOSKELETAL: No swelling, clubbing, or edema. Range of motion full in all extremities.  NEUROLOGIC: Difficult to fully perform given patient's inability to follow commands however strength 5/5 in all extremities does not appear to have muscle rigidity on passive range of motion Cranial nerves II through XII are intact. No gross focal neurological deficits. Sensation intact. Reflexes intact.  SKIN:, No ulceration, lesions, rashes, or cyanosis. Skin warm and dry. Turgor intact.  PSYCHIATRIC: Mood, affect flat. The patient is awake, alert and oriented self only. Insight, judgment intact.    LABORATORY PANEL:   CBC  Recent Labs Lab 07/12/15 1335  WBC 19.5*  HGB 14.3  HCT 43.1  PLT 220   ------------------------------------------------------------------------------------------------------------------  Chemistries   Recent Labs Lab 07/12/15 1335  NA 132*  K 2.7*  CL 93*  CO2 27  GLUCOSE 187*  BUN 11  CREATININE 0.64  CALCIUM 9.4  AST 32  ALT 34  ALKPHOS 106  BILITOT 0.6   ------------------------------------------------------------------------------------------------------------------  Cardiac Enzymes No results for input(s): TROPONINI in the last 168  hours. ------------------------------------------------------------------------------------------------------------------  RADIOLOGY:  Ct Head Wo Contrast  07/12/2015  CLINICAL DATA:  Confusion and history of prior infarcts EXAM: CT HEAD WITHOUT CONTRAST TECHNIQUE: Contiguous axial images were obtained from the base of the skull through the vertex without intravenous contrast. COMPARISON:  05/14/2015 FINDINGS: Bony calvarium is intact. No gross soft tissue abnormality is noted. No findings to suggest acute hemorrhage, acute infarction or space-occupying mass lesion are noted. IMPRESSION: No acute abnormality noted. Electronically Signed   By: Inez Catalina M.D.   On: 07/12/2015 15:15   Dg Chest Portable 1 View  07/12/2015  CLINICAL DATA:  Altered mental status. EXAM: PORTABLE CHEST 1 VIEW COMPARISON:  October 13, 2014. FINDINGS: The heart size and mediastinal contours are within normal limits. Both lungs are clear. No pneumothorax or pleural effusion is noted. The visualized skeletal structures are unremarkable. IMPRESSION: No acute cardiopulmonary abnormality seen. Electronically Signed   By: Marijo Conception, M.D.   On: 07/12/2015 16:06    EKG:   Orders placed or performed during the hospital encounter of 07/12/15  . EKG 12-Lead  .  EKG 12-Lead    IMPRESSION AND PLAN:   60 year old African-American female history of essential hypertension who is presenting with altered mental status one-day duration. Of note recently in the hospital but 2 months ago for similar presentation lead had a seizure at that time, MRI negative for acute findings  1. Hypertensive encephalopathy: Achieved blood pressure control goal less than 200/100, when necessary hydralazine continue home medications. In the setting of patient having low-grade fever will check CK level, TSH and looking for further etiologies, infectious workup negative thus far. We will hold Seroquel until more information returns the likelihood of this  being nms should be considered low but remains a possibility-continue supportive care. 2. Recent seizure on Keppra 3. Hypokalemia: Replace goal 4-5, place magnesium 4. GERD without esophagitis: PPI therapy 5. Venous thromboembolism prophylactic: Lovenox    All the records are reviewed and case discussed with ED provider. Management plans discussed with the patient, family and they are in agreement.  CODE STATUS: Full  TOTAL TIME TAKING CARE OF THIS PATIENT: 56 critical-care minutes.    Hower,  Karenann Cai.D on 07/12/2015 at 5:19 PM  Between 7am to 6pm - Pager - [phone]  After 6pm: House Pager: - [phone]  [facility]  Office  [phone]  CC: Primary care physician; Deborha Payment, MD

## 2015-07-12 NOTE — Progress Notes (Signed)
Patient is having starring spurs off and on but no seizure activities. Lactic acid =2.1. Patient was given Tylenol 650 mg oral for temp=101 and she spit it out. Tylenol 650 mg suppository was administered and will re-check temp later. The RN discussed the situation above with  Dr. Anselm Jungling  And also  requested for neuro consult as well as MRI.  MD stated the RN should consult day shift MD on round for order since it's not going to be done tonight. No new order for the lactic acid result. DR. Marthann Schiller also stated if patient refuse to take her oral medications, the RN can hold them for tonight. Family is concern what is being done for the pt. MD's responds will be communicated to the family. Will continue to monitor.

## 2015-07-13 ENCOUNTER — Inpatient Hospital Stay: Payer: Medicaid Other

## 2015-07-13 DIAGNOSIS — I674 Hypertensive encephalopathy: Secondary | ICD-10-CM

## 2015-07-13 DIAGNOSIS — F05 Delirium due to known physiological condition: Secondary | ICD-10-CM

## 2015-07-13 LAB — CBC
HEMATOCRIT: 41.9 % (ref 35.0–47.0)
HEMOGLOBIN: 14.3 g/dL (ref 12.0–16.0)
MCH: 28.4 pg (ref 26.0–34.0)
MCHC: 34.1 g/dL (ref 32.0–36.0)
MCV: 83.3 fL (ref 80.0–100.0)
PLATELETS: 196 10*3/uL (ref 150–440)
RBC: 5.03 MIL/uL (ref 3.80–5.20)
RDW: 14.2 % (ref 11.5–14.5)
WBC: 13.5 10*3/uL — AB (ref 3.6–11.0)

## 2015-07-13 LAB — RAPID HIV SCREEN (HIV 1/2 AB+AG)
HIV 1/2 ANTIBODIES: NONREACTIVE
HIV-1 P24 Antigen - HIV24: NONREACTIVE

## 2015-07-13 LAB — POTASSIUM: Potassium: 2.7 mmol/L — CL (ref 3.5–5.1)

## 2015-07-13 LAB — BASIC METABOLIC PANEL
ANION GAP: 10 (ref 5–15)
BUN: 9 mg/dL (ref 6–20)
CHLORIDE: 96 mmol/L — AB (ref 101–111)
CO2: 25 mmol/L (ref 22–32)
CREATININE: 0.61 mg/dL (ref 0.44–1.00)
Calcium: 9.7 mg/dL (ref 8.9–10.3)
GFR calc non Af Amer: >60 mL/min (ref >60)
Glucose, Bld: 131 mg/dL — ABNORMAL HIGH (ref 65–99)
POTASSIUM: 2.8 mmol/L — AB (ref 3.5–5.1)
SODIUM: 131 mmol/L — AB (ref 135–145)

## 2015-07-13 LAB — URINE DRUG SCREEN, QUALITATIVE (ARMC ONLY)
Amphetamines, Ur Screen: NOT DETECTED
BARBITURATES, UR SCREEN: NOT DETECTED
BENZODIAZEPINE, UR SCRN: NOT DETECTED
Cannabinoid 50 Ng, Ur ~~LOC~~: POSITIVE — AB
Cocaine Metabolite,Ur ~~LOC~~: NOT DETECTED
MDMA (Ecstasy)Ur Screen: NOT DETECTED
Methadone Scn, Ur: NOT DETECTED
OPIATE, UR SCREEN: NOT DETECTED
PHENCYCLIDINE (PCP) UR S: NOT DETECTED
Tricyclic, Ur Screen: NOT DETECTED

## 2015-07-13 LAB — DIFFERENTIAL
LYMPHS ABS: 1.8 10*3/uL (ref 1.0–3.6)
LYMPHS PCT: 14 %
Monocytes Absolute: 0.9 10*3/uL (ref 0.2–0.9)
Monocytes Relative: 7 %
NEUTROS ABS: 10.7 10*3/uL — AB (ref 1.4–6.5)
NEUTROS PCT: 79 %

## 2015-07-13 LAB — MAGNESIUM: Magnesium: 2.1 mg/dL (ref 1.7–2.4)

## 2015-07-13 MED ORDER — LACOSAMIDE 50 MG PO TABS
50.0000 mg | ORAL_TABLET | Freq: Two times a day (BID) | ORAL | Status: DC
  Administered 2015-07-13 – 2015-07-15 (×4): 50 mg via ORAL
  Filled 2015-07-13 (×4): qty 1

## 2015-07-13 MED ORDER — POTASSIUM CHLORIDE CRYS ER 20 MEQ PO TBCR
40.0000 meq | EXTENDED_RELEASE_TABLET | Freq: Once | ORAL | Status: AC
  Administered 2015-07-13: 40 meq via ORAL
  Filled 2015-07-13: qty 2

## 2015-07-13 MED ORDER — DOXYCYCLINE HYCLATE 100 MG PO TABS
100.0000 mg | ORAL_TABLET | Freq: Two times a day (BID) | ORAL | Status: DC
  Administered 2015-07-13 – 2015-07-15 (×5): 100 mg via ORAL
  Filled 2015-07-13 (×5): qty 1

## 2015-07-13 MED ORDER — DEXTROSE 5 % IV SOLN
10.0000 mg/kg | Freq: Three times a day (TID) | INTRAVENOUS | Status: DC
  Administered 2015-07-13 – 2015-07-15 (×6): 545 mg via INTRAVENOUS
  Filled 2015-07-13 (×9): qty 10.9

## 2015-07-13 MED ORDER — ALPRAZOLAM 0.25 MG PO TABS: 0.5000 mg | ORAL_TABLET | Freq: Three times a day (TID) | ORAL | Status: DC | PRN

## 2015-07-13 MED ORDER — POTASSIUM CHLORIDE 10 MEQ/100ML IV SOLN
10.0000 meq | INTRAVENOUS | Status: AC
  Administered 2015-07-13 (×2): 10 meq via INTRAVENOUS
  Filled 2015-07-13 (×2): qty 100

## 2015-07-13 MED ORDER — POTASSIUM CHLORIDE 10 MEQ/100ML IV SOLN
10.0000 meq | INTRAVENOUS | Status: AC
  Administered 2015-07-13 – 2015-07-14 (×4): 10 meq via INTRAVENOUS
  Filled 2015-07-13 (×4): qty 100

## 2015-07-13 MED ORDER — POTASSIUM CHLORIDE CRYS ER 20 MEQ PO TBCR
40.0000 meq | EXTENDED_RELEASE_TABLET | Freq: Two times a day (BID) | ORAL | Status: DC
  Administered 2015-07-13 – 2015-07-15 (×4): 40 meq via ORAL
  Filled 2015-07-13 (×4): qty 2

## 2015-07-13 MED ORDER — SODIUM CHLORIDE 0.9 % IV SOLN
100.0000 mg | Freq: Once | INTRAVENOUS | Status: AC
  Administered 2015-07-13: 100 mg via INTRAVENOUS
  Filled 2015-07-13: qty 10

## 2015-07-13 NOTE — Progress Notes (Signed)
ELECTROLYTE CONSULT NOTE - INITIAL   Pharmacy Consult for electrolyte monitoring and replacement Indication: hypokalemia  No Known Allergies  Patient Measurements: Height: 5\' 4"  (162.6 cm) (stated) Weight: 155 lb 9.6 oz (70.58 kg) IBW/kg (Calculated) : 54.7  Vital Signs: Temp: 98.5 F (36.9 C) (05/17 1301) Temp Source: Oral (05/17 1301) BP: 126/56 mmHg (05/17 1301) Pulse Rate: 69 (05/17 1301) Intake/Output from previous day: 05/16 0701 - 05/17 0700 In: 350 [IV Piggyback:350] Out: 1700 [Urine:1700] Intake/Output from this shift:    Labs:  Recent Labs  07/12/15 1335 07/13/15 0514  WBC 19.5* 13.5*  HGB 14.3 14.3  HCT 43.1 41.9  PLT 220 196  CREATININE 0.64 0.61  MG  --  2.1  ALBUMIN 5.1*  --   PROT 9.5*  --   AST 32  --   ALT 34  --   ALKPHOS 106  --   BILITOT 0.6  --    Estimated Creatinine Clearance: 74.8 mL/min (by C-G formula based on Cr of 0.61).  Medical History: Past Medical History  Diagnosis Date  . Hypertension   . Anxiety   . Depression   . Mini stroke ([place])   . Hyperlipidemia    Assessment: Pharmacy consulted to monitor and replace electrolytes if needed. Patient was hypokalemic with a K of 2.7 on admission yesterday. Despite repletion with 2 g Mg and 80 mEq PO KCl, potassium was still low this morning.  K = 2.8 with AM labs this morning. Patient has already received a total of 60 mEq of potassium today (PO and IV). Mg of 2.1 within normal limits  Goal of Therapy:  Electrolytes within normal limits  Plan:  Will recheck potassium level @ 1800 this evening   Pharmacy will continue to monitor. Thank you for the consult.  Lenis Noon, PharmD Clinical Pharmacist 07/13/2015,2:39 PM

## 2015-07-13 NOTE — Progress Notes (Addendum)
ELECTROLYTE CONSULT NOTE - INITIAL   Pharmacy Consult for electrolyte monitoring and replacement Indication: hypokalemia  No Known Allergies  Patient Measurements: Height: 5\' 4"  (162.6 cm) (stated) Weight: 155 lb 9.6 oz (70.58 kg) IBW/kg (Calculated) : 54.7  Vital Signs: Temp: 99.7 F (37.6 C) (05/17 2046) Temp Source: Oral (05/17 2046) BP: 99/60 mmHg (05/17 2046) Pulse Rate: 58 (05/17 2046) Intake/Output from previous day: 05/16 0701 - 05/17 0700 In: 350 [IV Piggyback:350] Out: 1700 [Urine:1700] Intake/Output from this shift:    Labs:  Recent Labs  07/12/15 1335 07/13/15 0514  WBC 19.5* 13.5*  HGB 14.3 14.3  HCT 43.1 41.9  PLT 220 196  CREATININE 0.64 0.61  MG  --  2.1  ALBUMIN 5.1*  --   PROT 9.5*  --   AST 32  --   ALT 34  --   ALKPHOS 106  --   BILITOT 0.6  --    Estimated Creatinine Clearance: 74.8 mL/min (by C-G formula based on Cr of 0.61).  Medical History: Past Medical History  Diagnosis Date  . Hypertension   . Anxiety   . Depression   . Mini stroke ([place])   . Hyperlipidemia    Assessment: Pharmacy consulted to monitor and replace electrolytes if needed. Patient was hypokalemic with a K of 2.7 on admission yesterday. Despite repletion with 2 g Mg and 80 mEq PO KCl, potassium was still low this morning.  K = 2.8 with AM labs this morning. Patient has already received a total of 60 mEq of potassium today (PO and IV). Mg of 2.1 within normal limits  Goal of Therapy:  Electrolytes within normal limits  Plan:  Will recheck potassium level @ 1800 this evening   Pharmacy will continue to monitor. Thank you for the consult.  5/17:  K @ 18:30 = 2.7 Will order KCl 10 mEq IV X 4 and recheck K on 5/18 with AM labs.   Orene Desanctis, PharmD Clinical Pharmacist 07/13/2015,8:54 PM

## 2015-07-13 NOTE — Progress Notes (Signed)
Patient alert to herself.  Poor memory and asks similar questions back to back.   Family at bedside.  High fall risk and will get up assist x1 to bedside commode - very impulsive.  NSR on monitor. VSS, on RA.  Taking medications whole with applesauce.

## 2015-07-13 NOTE — Progress Notes (Signed)
Pharmacy Antiviral Note  Sandra Charles is a 60 y.o. female admitted on 07/12/2015 with possible viral encephalitis.  Pharmacy has been consulted for acyclovir dosing.  Patient is also prescribed doxycycline, piperacillin/tazobactam, and vancomycin.   Plan: Order acyclovir 10 mg/kg IV q8h (dosed using ideal body weight of ~55 kg).  CrCl ~75 mL/min  Height: 5\' 4"  (162.6 cm) (stated) Weight: 155 lb 9.6 oz (70.58 kg) IBW/kg (Calculated) : 54.7  Temp (24hrs), Avg:99.3 F (37.4 C), Min:98.1 F (36.7 C), Max:101 F (38.3 C)   Recent Labs Lab 07/12/15 1335 07/12/15 1644 07/12/15 1901 07/13/15 0514  WBC 19.5*  --   --  13.5*  CREATININE 0.64  --   --  0.61  LATICACIDVEN  --  1.5 2.1*  --     Estimated Creatinine Clearance: 74.8 mL/min (by C-G formula based on Cr of 0.61).    No Known Allergies  Antimicrobials this admission: Vancomycin 5/16 >>  Piperacillin/tazobactam 5/16 >>  Doxycycline 5/17 >> Acyclovir 5/17 >>  Dose adjustments this admission:  Microbiology results: 5/16 BCx: Sent  Thank you for allowing pharmacy to be a part of this patient's care.  Lenis Noon, PharmD Clinical Pharmacist 07/13/2015 1:32 PM

## 2015-07-13 NOTE — Consult Note (Signed)
[facility] Infectious Disease     Reason for Consult:AMS, fever    Referring Physician: Karlton Lemon Date of Admission:  07/12/2015   Principal Problem:   Hypertensive encephalopathy   HPI: Sandra Charles is a 60 y.o. female admitted 5/16 with AMS of one day duration.  She on admit was found to have a wbc 19.5 and spiked fever to 101.  Bp on admit was 217/93 but now improving.  Started on vanco, zosyn and doxy and wbc down to 13, fever curve improving.  Tox screen + THC CT head neg, CXR negative. MRI pending.  [place] pending, UCX pending, UA with 0-5 wbc Has been seen by neurology. Of note similar admission in March  With initial wbc 13.2, Utox + opiates and THC. MRI at that time showed no acute change but mild chronic svid  Currently she is more awake and interactive but remains confused. Family at bedside  Past Medical History  Diagnosis Date  . Hypertension   . Anxiety   . Depression   . Mini stroke ([place])   . Hyperlipidemia    Past Surgical History  Procedure Laterality Date  . Vaginal hysterectomy     Social History  Substance Use Topics  . Smoking status: Former Smoker -- 1.00 packs/day for 5 years    Types: Cigarettes  . Smokeless tobacco: None  . Alcohol Use: No   Family History  Problem Relation Age of Onset  . Hyperlipidemia Mother   . Breast cancer Mother 25  . Hypertension Mother   . Heart attack Mother     age 42's  . Breast cancer Sister 23  . Hyperlipidemia Father   . Hypertension Father   . Heart attack Father 88    Allergies: No Known Allergies  Current antibiotics: Antibiotics Given (last 72 hours)    Date/Time Action Medication Dose Rate   07/12/15 2200 Given   vancomycin (VANCOCIN) 1,250 mg in sodium chloride 0.9 % 250 mL IVPB 1,250 mg 166.7 mL/hr   07/12/15 2248 Given   piperacillin-tazobactam (ZOSYN) IVPB 3.375 g 3.375 g 12.5 mL/hr   07/13/15 0555 Given   piperacillin-tazobactam (ZOSYN) IVPB 3.375 g 3.375 g 12.5 mL/hr   07/13/15 1002 Given    vancomycin (VANCOCIN) 1,250 mg in sodium chloride 0.9 % 250 mL IVPB 1,250 mg 166.7 mL/hr   07/13/15 1414 Given   doxycycline (VIBRA-TABS) tablet 100 mg 100 mg       MEDICATIONS: . acyclovir  10 mg/kg (Ideal) Intravenous Q8H  . amLODipine  10 mg Oral Daily  . aspirin EC  81 mg Oral Daily  . carvedilol  25 mg Oral BID WC  . cholecalciferol  5,000 Units Oral Daily  . citalopram  40 mg Oral Daily  . doxycycline  100 mg Oral Q12H  . enoxaparin (LOVENOX) injection  40 mg Subcutaneous Q24H  . lacosamide (VIMPAT) IV  100 mg Intravenous Once  . lacosamide  50 mg Oral BID  . pantoprazole  40 mg Oral Daily  . piperacillin-tazobactam (ZOSYN)  IV  3.375 g Intravenous Q8H  . potassium chloride  40 mEq Oral BID  . pravastatin  40 mg Oral q1800  . sodium chloride flush  3 mL Intravenous Q12H  . vancomycin  1,250 mg Intravenous Q12H   Review of Systems - 11 systems reviewed and negative per HPI  OBJECTIVE: Temp:  [98.1 F (36.7 C)-101 F (38.3 C)] 98.5 F (36.9 C) (05/17 1301) Pulse Rate:  [69-99] 69 (05/17 1301) Resp:  [18-36]  18 (05/17 1301) BP: (126-203)/(56-92) 126/56 mmHg (05/17 1301) SpO2:  [89 %-99 %] 99 % (05/17 1301) Weight:  [70.58 kg (155 lb 9.6 oz)] 70.58 kg (155 lb 9.6 oz) (05/16 1829) Physical Exam  Constitutional:  Awake but somewhat drowsy, she thinks she is at her friend's house, cannot name the year. HENT: [place]/AT, PERRLA, no scleral icterus Mouth/Throat: Oropharynx is clear and moist. No oropharyngeal exudate.  Cardiovascular: Normal rate, regular rhythm and normal heart sounds.  Pulmonary/Chest: Effort normal and breath sounds normal. No respiratory distress.  has no wheezes.  Neck supple, no nuchal rigidity Abdominal: Soft. Bowel sounds are normal.  exhibits no distension. There is no tenderness.  Lymphadenopathy: no cervical adenopathy. No axillary adenopathy Neurological: Awake but somewhat drowsy, she thinks she is at her friend's house, cannot name the  year. Skin: Skin is warm and dry. No rash noted. No erythema.  Psychiatric: confused  LABS: Results for orders placed or performed during the hospital encounter of 07/12/15 (from the past 48 hour(s))  Comprehensive metabolic panel     Status: Abnormal   Collection Time: 07/12/15  1:35 PM  Result Value Ref Range   Sodium 132 (L) 135 - 145 mmol/L   Potassium 2.7 (LL) 3.5 - 5.1 mmol/L    Comment: CRITICAL RESULT CALLED TO, READ BACK BY AND VERIFIED WITH AMBER JONES AT 1513 07/12/2015 BY TFK    Chloride 93 (L) 101 - 111 mmol/L   CO2 27 22 - 32 mmol/L   Glucose, Bld 187 (H) 65 - 99 mg/dL   BUN 11 6 - 20 mg/dL   Creatinine, Ser 0.64 0.44 - 1.00 mg/dL   Calcium 9.4 8.9 - 10.3 mg/dL   Total Protein 9.5 (H) 6.5 - 8.1 g/dL   Albumin 5.1 (H) 3.5 - 5.0 g/dL   AST 32 15 - 41 U/L   ALT 34 14 - 54 U/L   Alkaline Phosphatase 106 38 - 126 U/L   Total Bilirubin 0.6 0.3 - 1.2 mg/dL   GFR calc non Af Amer >60 >60 mL/min   GFR calc Af Amer >60 >60 mL/min    Comment: (NOTE) The eGFR has been calculated using the CKD EPI equation. This calculation has not been validated in all clinical situations. eGFR's persistently <60 mL/min signify possible Chronic Kidney Disease.    Anion gap 12 5 - 15  CBC     Status: Abnormal   Collection Time: 07/12/15  1:35 PM  Result Value Ref Range   WBC 19.5 (H) 3.6 - 11.0 K/uL   RBC 5.08 3.80 - 5.20 MIL/uL   Hemoglobin 14.3 12.0 - 16.0 g/dL   HCT 43.1 35.0 - 47.0 %   MCV 84.8 80.0 - 100.0 fL   MCH 28.2 26.0 - 34.0 pg   MCHC 33.2 32.0 - 36.0 g/dL   RDW 14.6 (H) 11.5 - 14.5 %   Platelets 220 150 - 440 K/uL  Sedimentation rate     Status: None   Collection Time: 07/12/15  1:35 PM  Result Value Ref Range   Sed Rate 6 0 - 30 mm/hr  Glucose, capillary     Status: Abnormal   Collection Time: 07/12/15  2:33 PM  Result Value Ref Range   Glucose-Capillary 190 (H) 65 - 99 mg/dL  Urinalysis complete, with microscopic ([facility] only)     Status: Abnormal   Collection  Time: 07/12/15  3:18 PM  Result Value Ref Range   Color, Urine STRAW (A) YELLOW   APPearance CLEAR (A)  CLEAR   Glucose, UA >500 (A) NEGATIVE mg/dL   Bilirubin Urine NEGATIVE NEGATIVE   Ketones, ur TRACE (A) NEGATIVE mg/dL   Specific Gravity, Urine 1.011 1.005 - 1.030   Hgb urine dipstick 1+ (A) NEGATIVE   pH 8.0 5.0 - 8.0   Protein, ur >500 (A) NEGATIVE mg/dL   Nitrite NEGATIVE NEGATIVE   Leukocytes, UA NEGATIVE NEGATIVE   RBC / HPF 0-5 0 - 5 RBC/hpf   WBC, UA 0-5 0 - 5 WBC/hpf   Bacteria, UA NONE SEEN NONE SEEN   Squamous Epithelial / LPF NONE SEEN NONE SEEN   Mucous PRESENT   Urine Drug Screen, Qualitative ([facility] only)     Status: Abnormal   Collection Time: 07/12/15  3:18 PM  Result Value Ref Range   Tricyclic, Ur Screen NONE DETECTED NONE DETECTED   Amphetamines, Ur Screen NONE DETECTED NONE DETECTED   MDMA (Ecstasy)Ur Screen NONE DETECTED NONE DETECTED   Cocaine Metabolite,Ur [place] NONE DETECTED NONE DETECTED   Opiate, Ur Screen NONE DETECTED NONE DETECTED   Phencyclidine (PCP) Ur S NONE DETECTED NONE DETECTED   Cannabinoid 50 Ng, Ur  POSITIVE (A) NONE DETECTED   Barbiturates, Ur Screen NONE DETECTED NONE DETECTED   Benzodiazepine, Ur Scrn NONE DETECTED NONE DETECTED   Methadone Scn, Ur NONE DETECTED NONE DETECTED    Comment: (NOTE) 710  Tricyclics, urine               Cutoff 1000 ng/mL 200  Amphetamines, urine             Cutoff 1000 ng/mL 300  MDMA (Ecstasy), urine           Cutoff 500 ng/mL 400  Cocaine Metabolite, urine       Cutoff 300 ng/mL 500  Opiate, urine                   Cutoff 300 ng/mL 600  Phencyclidine (PCP), urine      Cutoff 25 ng/mL 700  Cannabinoid, urine              Cutoff 50 ng/mL 800  Barbiturates, urine             Cutoff 200 ng/mL 900  Benzodiazepine, urine           Cutoff 200 ng/mL 1000 Methadone, urine                Cutoff 300 ng/mL 1100 1200 The urine drug screen provides only a preliminary, unconfirmed 1300 analytical test result and  should not be used for non-medical 1400 purposes. Clinical consideration and professional judgment should 1500 be applied to any positive drug screen result due to possible 1600 interfering substances. A more specific alternate chemical method 1700 must be used in order to obtain a confirmed analytical result.  1800 Gas chromato graphy / mass spectrometry (GC/MS) is the preferred 1900 confirmatory method.   Lactic acid, plasma     Status: None   Collection Time: 07/12/15  4:44 PM  Result Value Ref Range   Lactic Acid, Venous 1.5 0.5 - 2.0 mmol/L  Lactic acid, plasma     Status: Abnormal   Collection Time: 07/12/15  7:01 PM  Result Value Ref Range   Lactic Acid, Venous 2.1 (HH) 0.5 - 2.0 mmol/L    Comment: CRITICAL RESULT CALLED TO, READ BACK BY AND VERIFIED WITH CHARLOTTE KYEI  AT 1958 07/12/2015 BY TFK   TSH     Status: None   Collection  Time: 07/12/15  7:01 PM  Result Value Ref Range   TSH 2.515 0.350 - 4.500 uIU/mL  T4, free     Status: None   Collection Time: 07/12/15  7:01 PM  Result Value Ref Range   Free T4 0.78 0.61 - 1.12 ng/dL  CK     Status: None   Collection Time: 07/12/15  7:01 PM  Result Value Ref Range   Total CK 137 38 - 234 U/L  Basic metabolic panel     Status: Abnormal   Collection Time: 07/13/15  5:14 AM  Result Value Ref Range   Sodium 131 (L) 135 - 145 mmol/L   Potassium 2.8 (LL) 3.5 - 5.1 mmol/L    Comment: CRITICAL RESULT CALLED TO, READ BACK BY AND VERIFIED WITH CHARLOTTE KYEI ON 07/13/15 AT 0557 BY TLB    Chloride 96 (L) 101 - 111 mmol/L   CO2 25 22 - 32 mmol/L   Glucose, Bld 131 (H) 65 - 99 mg/dL   BUN 9 6 - 20 mg/dL   Creatinine, Ser 0.61 0.44 - 1.00 mg/dL   Calcium 9.7 8.9 - 10.3 mg/dL   GFR calc non Af Amer >60 >60 mL/min   GFR calc Af Amer >60 >60 mL/min    Comment: (NOTE) The eGFR has been calculated using the CKD EPI equation. This calculation has not been validated in all clinical situations. eGFR's persistently <60 mL/min signify  possible Chronic Kidney Disease.    Anion gap 10 5 - 15  CBC     Status: Abnormal   Collection Time: 07/13/15  5:14 AM  Result Value Ref Range   WBC 13.5 (H) 3.6 - 11.0 K/uL   RBC 5.03 3.80 - 5.20 MIL/uL   Hemoglobin 14.3 12.0 - 16.0 g/dL   HCT 41.9 35.0 - 47.0 %   MCV 83.3 80.0 - 100.0 fL   MCH 28.4 26.0 - 34.0 pg   MCHC 34.1 32.0 - 36.0 g/dL   RDW 14.2 11.5 - 14.5 %   Platelets 196 150 - 440 K/uL  Magnesium     Status: None   Collection Time: 07/13/15  5:14 AM  Result Value Ref Range   Magnesium 2.1 1.7 - 2.4 mg/dL   No components found for: ESR, C REACTIVE PROTEIN MICRO: No results found for this or any previous visit (from the past 720 hour(s)).  IMAGING: Ct Head Wo Contrast  07/12/2015  CLINICAL DATA:  Confusion and history of prior infarcts EXAM: CT HEAD WITHOUT CONTRAST TECHNIQUE: Contiguous axial images were obtained from the base of the skull through the vertex without intravenous contrast. COMPARISON:  05/14/2015 FINDINGS: Bony calvarium is intact. No gross soft tissue abnormality is noted. No findings to suggest acute hemorrhage, acute infarction or space-occupying mass lesion are noted. IMPRESSION: No acute abnormality noted. Electronically Signed   By: Inez Catalina M.D.   On: 07/12/2015 15:15   Dg Chest Portable 1 View  07/12/2015  CLINICAL DATA:  Altered mental status. EXAM: PORTABLE CHEST 1 VIEW COMPARISON:  October 13, 2014. FINDINGS: The heart size and mediastinal contours are within normal limits. Both lungs are clear. No pneumothorax or pleural effusion is noted. The visualized skeletal structures are unremarkable. IMPRESSION: No acute cardiopulmonary abnormality seen. Electronically Signed   By: Marijo Conception, M.D.   On: 07/12/2015 16:06    Assessment:   Sandra Charles is a 60 y.o. female admitted with AMS and WBC 19 as well as fever. Per family she was not having  any confusion prior to the day of admission and was not complaining of fevers, dysuria, cough, GI  issues.  She was complaining of a headache but gets these frequently. She had very similar admit in March and WU neg. Does have + Utox with THC this admit and opiates THC last admit. She has not pets, no travel, no hiking but does work in her garden. No known tick bites. She is disabled due to anxiety per family and does not work. I do suspect she has had seizures and possibly aspirated when altered as cause of the fever on admit but unclear at this point. No evidence bacterial meningitis, could be viral meningoencephalitis but improving rapidly and no longer febrile  Recommendations Check HIV RPR Check diff on CBC Check MRI Cont current abx pending culture results. Thank you very much for allowing me to participate in the care of this patient. Please call with questions.   Cheral Marker. Ola Spurr, MD

## 2015-07-13 NOTE — Consult Note (Signed)
Reason for Consult:Altered mental status Referring Physician: Leslye Peer  CC: Confusion  HPI: Sandra Charles is an 60 y.o. female who family reports went to bed normal on Monday.  Awakened yesterday and was confused.  Sandra Charles reports that she was delusional.  Sister reports that she could not see anything as well.  Remained confused for the entirety of the day and with no improvement was brought in for evaluation.   Patient was admitted in March of this year with confusion as well.  At that time was found to have bitten her tongue as well.  Seizure was postulated although work up was negative and the patient was started on Keppra.  Per report of family the patient did worse on Keppra.  She has seen her doctor since that time and it appears some changes were made but it is unclear what changes were made.  It is clear that she has not been fully compliant.    Past Medical History  Diagnosis Date  . Hypertension   . Anxiety   . Depression   . Mini stroke ([place])   . Hyperlipidemia     Past Surgical History  Procedure Laterality Date  . Vaginal hysterectomy      Family History  Problem Relation Age of Onset  . Hyperlipidemia Mother   . Breast cancer Mother 91  . Hypertension Mother   . Heart attack Mother     age 73's  . Breast cancer Sister 104  . Hyperlipidemia Father   . Hypertension Father   . Heart attack Father 58    Social History:  reports that she has quit smoking. Her smoking use included Cigarettes. She has a 5 pack-year smoking history. She does not have any smokeless tobacco history on file. She reports that she does not drink alcohol or use illicit drugs.  No Known Allergies  Medications:  I have reviewed the patient's current medications. Prior to Admission:  Prescriptions prior to admission  Medication Sig Dispense Refill Last Dose  . ALPRAZolam (XANAX) 1 MG tablet Take 1 mg by mouth 3 (three) times daily as needed for anxiety.   07/11/2015 at Unknown time  .  amLODipine (NORVASC) 10 MG tablet Take 10 mg by mouth daily.   07/11/2015 at Unknown time  . aspirin EC 81 MG tablet Take 81 mg by mouth daily.    07/11/2015 at [place]   . benzocaine (ORAJEL) 10 % mucosal gel Use as directed 1 application in the mouth or throat as needed (for tongue ulcers/lacerations).   Past Week at Unknown time  . carvedilol (COREG) 25 MG tablet Take 25 mg by mouth 2 (two) times daily with a meal.   07/11/2015 at [facility]   . Cholecalciferol (VITAMIN D3) 5000 UNITS TABS Take 5,000 Units by mouth daily.   07/11/2015 at Unknown time  . citalopram (CELEXA) 40 MG tablet Take 40 mg by mouth daily.   07/11/2015 at Unknown time  . hydrochlorothiazide (HYDRODIURIL) 25 MG tablet Take 25 mg by mouth daily.   07/11/2015 at Unknown time  . HYDROcodone-acetaminophen (NORCO) 5-325 MG tablet Take 1 tablet by mouth every 6 (six) hours as needed for moderate pain. 20 tablet 0 07/11/2015 at [place]   . levETIRAcetam (KEPPRA) 500 MG tablet Take 1 tablet (500 mg total) by mouth 2 (two) times daily. 60 tablet 2 07/11/2015 at Unknown time  . lovastatin (MEVACOR) 40 MG tablet Take 40 mg by mouth at bedtime.    07/11/2015 at Unknown time  . pantoprazole (  PROTONIX) 40 MG tablet Take 1 tablet (40 mg total) by mouth daily. 30 tablet 11 07/11/2015 at Unknown time  . QUEtiapine (SEROQUEL) 100 MG tablet Take 200 mg by mouth at bedtime.   07/11/2015 at Unknown time   Scheduled: . amLODipine  10 mg Oral Daily  . aspirin EC  81 mg Oral Daily  . carvedilol  25 mg Oral BID WC  . cholecalciferol  5,000 Units Oral Daily  . citalopram  40 mg Oral Daily  . doxycycline  100 mg Oral Q12H  . enoxaparin (LOVENOX) injection  40 mg Subcutaneous Q24H  . levETIRAcetam  500 mg Oral BID  . pantoprazole  40 mg Oral Daily  . piperacillin-tazobactam (ZOSYN)  IV  3.375 g Intravenous Q8H  . potassium chloride  40 mEq Oral BID  . pravastatin  40 mg Oral q1800  . sodium chloride flush  3 mL Intravenous Q12H  . vancomycin  1,250 mg  Intravenous Q12H    ROS: Patient unable to provide due to mental status  Physical Examination: Blood pressure 144/69, pulse 87, temperature 98.7 F (37.1 C), temperature source Oral, resp. rate 20, height 5\' 4"  (1.626 m), weight 70.58 kg (155 lb 9.6 oz), SpO2 98 %.  HEENT-  Normocephalic, no lesions, without obvious abnormality.  Normal external eye and conjunctiva.  Normal TM's bilaterally.  Normal auditory canals and external ears. Normal external nose, mucus membranes and septum.  Normal pharynx. Cardiovascular- S1, S2 normal, pulses palpable throughout   Lungs- chest clear, no wheezing, rales, normal symmetric air entry Abdomen- soft, non-tender; bowel sounds normal; no masses,  no organomegaly Extremities- no edema Lymph-no adenopathy palpable Musculoskeletal-no joint tenderness, deformity or swelling Skin-warm and dry, no hyperpigmentation, vitiligo, or suspicious lesions  Neurological Examination Mental Status: Lethargic.  Difficulty following 3-step commands.  Speech fluent but patient clearly confused.   Cranial Nerves: II: Discs flat bilaterally; Visual fields grossly normal, pupils equal, round, reactive to light and accommodation III,IV, VI: ptosis not present, extra-ocular motions intact bilaterally V,VII: smile symmetric, facial light touch sensation normal bilaterally VIII: hearing normal bilaterally IX,X: gag reflex present XI: bilateral shoulder shrug XII: midline tongue extension Motor: Right : Upper extremity   5/5    Left:     Upper extremity   5/5  Lower extremity   5/5     Lower extremity   5/5 Tone and bulk:normal tone throughout; no atrophy noted Sensory: Pinprick and light touch intact throughout, bilaterally Deep Tendon Reflexes: 2+ and symmetric throughout Plantars: Right: downgoing   Left: downgoing Cerebellar: Normal finger-to-nose and normal heel-to-shin testing bilaterally Gait: not tested due to safety concerns   Laboratory Studies:   Basic  Metabolic Panel:  Recent Labs Lab 07/12/15 1335 07/13/15 0514  NA 132* 131*  K 2.7* 2.8*  CL 93* 96*  CO2 27 25  GLUCOSE 187* 131*  BUN 11 9  CREATININE 0.64 0.61  CALCIUM 9.4 9.7  MG  --  2.1    Liver Function Tests:  Recent Labs Lab 07/12/15 1335  AST 32  ALT 34  ALKPHOS 106  BILITOT 0.6  PROT 9.5*  ALBUMIN 5.1*   No results for input(s): LIPASE, AMYLASE in the last 168 hours. No results for input(s): AMMONIA in the last 168 hours.  CBC:  Recent Labs Lab 07/12/15 1335 07/13/15 0514  WBC 19.5* 13.5*  HGB 14.3 14.3  HCT 43.1 41.9  MCV 84.8 83.3  PLT 220 196    Cardiac Enzymes:  Recent Labs Lab 07/12/15  1901  CKTOTAL 137    BNP: Invalid input(s): POCBNP  CBG:  Recent Labs Lab 07/12/15 1433  GLUCAP 190*    Microbiology: Results for orders placed or performed during the hospital encounter of 07/19/14  Wet prep, genital     Status: Abnormal   Collection Time: 07/19/14  2:12 PM  Result Value Ref Range Status   Yeast Wet Prep HPF POC NONE SEEN NONE SEEN Final   Trich, Wet Prep NONE SEEN NONE SEEN Final   Clue Cells Wet Prep HPF POC NONE SEEN NONE SEEN Final   WBC, Wet Prep HPF POC FEW (A) NONE SEEN Final  Chlamydia/NGC rt PCR [facility])     Status: None   Collection Time: 07/19/14  2:12 PM  Result Value Ref Range Status   Specimen source GC/Chlam CERVIX  Final   Chlamydia Tr NOT DETECTED  Final   N gonorrhoeae NOT DETECTED  Final    Comment: (NOTE) 100  This methodology has not been evaluated in pregnant women or in 200  patients with a history of hysterectomy. 300 400  This methodology will not be performed on patients less than 40  years of age.     Coagulation Studies: No results for input(s): LABPROT, INR in the last 72 hours.  Urinalysis:  Recent Labs Lab 07/12/15 1518  COLORURINE STRAW*  LABSPEC 1.011  PHURINE 8.0  GLUCOSEU >500*  HGBUR 1+*  BILIRUBINUR NEGATIVE  KETONESUR TRACE*  PROTEINUR >500*  NITRITE NEGATIVE   LEUKOCYTESUR NEGATIVE    Lipid Panel:  No results found for: CHOL, TRIG, HDL, CHOLHDL, VLDL, LDLCALC  HgbA1C: No results found for: HGBA1C  Urine Drug Screen:     Component Value Date/Time   LABOPIA NONE DETECTED 07/12/2015 1518   COCAINSCRNUR NONE DETECTED 07/12/2015 1518   LABBENZ NONE DETECTED 07/12/2015 1518   AMPHETMU NONE DETECTED 07/12/2015 1518   THCU POSITIVE* 07/12/2015 1518   LABBARB NONE DETECTED 07/12/2015 1518    Alcohol Level: No results for input(s): ETH in the last 168 hours.  Other results: EKG: sinus tachycardia at 102 bpm, prolonged PR interval.  Imaging: Ct Head Wo Contrast  07/12/2015  CLINICAL DATA:  Confusion and history of prior infarcts EXAM: CT HEAD WITHOUT CONTRAST TECHNIQUE: Contiguous axial images were obtained from the base of the skull through the vertex without intravenous contrast. COMPARISON:  05/14/2015 FINDINGS: Bony calvarium is intact. No gross soft tissue abnormality is noted. No findings to suggest acute hemorrhage, acute infarction or space-occupying mass lesion are noted. IMPRESSION: No acute abnormality noted. Electronically Signed   By: Inez Catalina M.D.   On: 07/12/2015 15:15   Dg Chest Portable 1 View  07/12/2015  CLINICAL DATA:  Altered mental status. EXAM: PORTABLE CHEST 1 VIEW COMPARISON:  October 13, 2014. FINDINGS: The heart size and mediastinal contours are within normal limits. Both lungs are clear. No pneumothorax or pleural effusion is noted. The visualized skeletal structures are unremarkable. IMPRESSION: No acute cardiopulmonary abnormality seen. Electronically Signed   By: Marijo Conception, M.D.   On: 07/12/2015 16:06     Assessment/Plan: 61 year old female presenting with confusion, fever and elevated white blood cell count.  No source for fever identified.  Patient on Zosyn, vancomycin and doxycycline.  Family refusing LP.  Head CT personally reviewed and shows no acute changes.  Can not rule out possibility of viral  encephalitis as well.  Patient also with history of possible seizure.  Unclear compliance with Keppra but has reported side effects  prior to admission and patient now on Keppra.  Will likely benefit from change in anticonvulsant therapy since this may be prolonging her altered mental status that may have initially been secondary to a seizure.  Lastly, patient with markedly elevated BP on presentation.  Can not rule out hypertensive encephalopathy/PRES.  Recommendations: 1.  MRI of the brain without contrast 2.  EEG 3.  Recommend Acyclovir IV 4.  D/C Keppra 5.  Vimpat 100mg  IV now with maintenance of 50mg  BID 6.  Seizure precautions 7.  BP control with target SBP<160  Alexis Goodell, MD Neurology [phone] 07/13/2015, 12:40 PM

## 2015-07-13 NOTE — Progress Notes (Signed)
Report from Amy RN. Patient down at MRI at this time.

## 2015-07-13 NOTE — Progress Notes (Signed)
Patient ID: Sandra Charles, female   DOB: 1955-12-07, 60 y.o.   MRN: [id number] [facility] PROGRESS NOTE  Sandra Charles [id number] DOB: April 04, 1955 DOA: 07/12/2015 PCP: Deborha Payment, MD  HPI/Subjective: Patient asked my name 4 times while was in the room. She no hernia and her date of birth. Kept on repeating the same questions over and over. As per family mental status today better than it was yesterday.  Objective: Filed Vitals:   07/13/15 0818 07/13/15 1301  BP:  126/56  Pulse:  69  Temp: 98.7 F (37.1 C) 98.5 F (36.9 C)  Resp:  18    Filed Weights   07/12/15 1409 07/12/15 1829  Weight: 69.4 kg (153 lb) 70.58 kg (155 lb 9.6 oz)    ROS: Review of Systems  Constitutional: Negative for fever and chills.  Eyes: Negative for blurred vision.  Respiratory: Positive for cough. Negative for shortness of breath.   Cardiovascular: Negative for chest pain.  Gastrointestinal: Negative for nausea, vomiting, abdominal pain, diarrhea and constipation.  Genitourinary: Negative for dysuria.  Musculoskeletal: Negative for joint pain.  Neurological: Negative for dizziness and headaches.   Exam: Physical Exam  HENT:  Nose: No mucosal edema.  Mouth/Throat: No oropharyngeal exudate or posterior oropharyngeal edema.  Eyes: Conjunctivae, EOM and lids are normal. Pupils are equal, round, and reactive to light.  Neck: No JVD present. Carotid bruit is not present. No edema present. No thyroid mass and no thyromegaly present.  Cardiovascular: S1 normal and S2 normal.  Exam reveals no gallop.   No murmur heard. Pulses:      Dorsalis pedis pulses are 2+ on the right side, and 2+ on the left side.  Respiratory: No respiratory distress. She has no wheezes. She has no rhonchi. She has no rales.  GI: Soft. Bowel sounds are normal. There is no tenderness.  Musculoskeletal:       Right ankle: She exhibits no swelling.       Left ankle: She exhibits no swelling.  Lymphadenopathy:    She  has no cervical adenopathy.  Neurological: She is alert.  Moves all extremities  Skin: Skin is warm. No rash noted. Nails show no clubbing.  Psychiatric:  Keeps on repeating the same questions over and over again.      Data Reviewed: Basic Metabolic Panel:  Recent Labs Lab 07/12/15 1335 07/13/15 0514  NA 132* 131*  K 2.7* 2.8*  CL 93* 96*  CO2 27 25  GLUCOSE 187* 131*  BUN 11 9  CREATININE 0.64 0.61  CALCIUM 9.4 9.7  MG  --  2.1   Liver Function Tests:  Recent Labs Lab 07/12/15 1335  AST 32  ALT 34  ALKPHOS 106  BILITOT 0.6  PROT 9.5*  ALBUMIN 5.1*   CBC:  Recent Labs Lab 07/12/15 1335 07/13/15 0514  WBC 19.5* 13.5*  HGB 14.3 14.3  HCT 43.1 41.9  MCV 84.8 83.3  PLT 220 196   Cardiac Enzymes:  Recent Labs Lab 07/12/15 1901  CKTOTAL 137    CBG:  Recent Labs Lab 07/12/15 1433  GLUCAP 190*       Studies: Ct Head Wo Contrast  07/12/2015  CLINICAL DATA:  Confusion and history of prior infarcts EXAM: CT HEAD WITHOUT CONTRAST TECHNIQUE: Contiguous axial images were obtained from the base of the skull through the vertex without intravenous contrast. COMPARISON:  05/14/2015 FINDINGS: Bony calvarium is intact. No gross soft tissue abnormality is noted. No findings to suggest acute hemorrhage, acute  infarction or space-occupying mass lesion are noted. IMPRESSION: No acute abnormality noted. Electronically Signed   By: Inez Catalina M.D.   On: 07/12/2015 15:15   Dg Chest Portable 1 View  07/12/2015  CLINICAL DATA:  Altered mental status. EXAM: PORTABLE CHEST 1 VIEW COMPARISON:  October 13, 2014. FINDINGS: The heart size and mediastinal contours are within normal limits. Both lungs are clear. No pneumothorax or pleural effusion is noted. The visualized skeletal structures are unremarkable. IMPRESSION: No acute cardiopulmonary abnormality seen. Electronically Signed   By: Marijo Conception, M.D.   On: 07/12/2015 16:06    Scheduled Meds: . acyclovir  10 mg/kg  (Ideal) Intravenous Q8H  . amLODipine  10 mg Oral Daily  . aspirin EC  81 mg Oral Daily  . carvedilol  25 mg Oral BID WC  . cholecalciferol  5,000 Units Oral Daily  . citalopram  40 mg Oral Daily  . doxycycline  100 mg Oral Q12H  . enoxaparin (LOVENOX) injection  40 mg Subcutaneous Q24H  . lacosamide  50 mg Oral BID  . pantoprazole  40 mg Oral Daily  . piperacillin-tazobactam (ZOSYN)  IV  3.375 g Intravenous Q8H  . potassium chloride  40 mEq Oral BID  . pravastatin  40 mg Oral q1800  . sodium chloride flush  3 mL Intravenous Q12H  . vancomycin  1,250 mg Intravenous Q12H    Assessment/Plan:  1. Acute encephalopathy. Unclear etiology with her infectious or neurological or hypertensive encephalopathy. As per family little bit better now than yesterday. Still something is not right. Urine drug toxicology only positive for cannabis. Boyfriend states that she is out in the garden a lot. No meningeal signs at this time. 2. Fever. Patient placed on aggressive antibiotics with vancomycin and Zosyn. Neurology also recommended IV acyclovir. I added doxycycline. Infectious disease consultation appreciated 3. History of seizure on like a semi- 4. Accelerated hypertension on amlodipine 5. Severe hypokalemia replace magnesium and potassium. Electrolyte protocol ordered. Stop hydrochlorothiazide 6. Hyperlipidemia unspecified. Stop pravastatin. Pravastatin can cause altered mental status 2. 7. Esophageal reflux disease without esophagitis on PPI  Code Status:     Code Status Orders        Start     Ordered   07/12/15 1644  Full code   Continuous     07/12/15 1644    Code Status History    Date Active Date Inactive Code Status Order ID Comments User Context   05/14/2015  2:55 AM 05/15/2015  2:58 PM Full Code [id number]  Saundra Shelling, MD Inpatient     Family Communication: Family at bedside Disposition Plan: To be determined  Consultants:  Neurology  Infectious  disease  Antibiotics:  Acyclovir  Vancomycin  Zosyn  Doxycycline  Time spent: 35 minutes  [place], [place]

## 2015-07-14 ENCOUNTER — Inpatient Hospital Stay: Payer: Medicaid Other

## 2015-07-14 DIAGNOSIS — G0490 Encephalitis and encephalomyelitis, unspecified: Secondary | ICD-10-CM | POA: Diagnosis present

## 2015-07-14 DIAGNOSIS — R410 Disorientation, unspecified: Secondary | ICD-10-CM | POA: Insufficient documentation

## 2015-07-14 HISTORY — PX: LUMBAR PUNCTURE: SHX1985

## 2015-07-14 LAB — BASIC METABOLIC PANEL
ANION GAP: 9 (ref 5–15)
BUN: 18 mg/dL (ref 6–20)
CALCIUM: 9.5 mg/dL (ref 8.9–10.3)
CO2: 27 mmol/L (ref 22–32)
CREATININE: 0.78 mg/dL (ref 0.44–1.00)
Chloride: 97 mmol/L — ABNORMAL LOW (ref 101–111)
GFR calc Af Amer: >60 mL/min (ref >60)
GFR calc non Af Amer: >60 mL/min (ref >60)
GLUCOSE: 117 mg/dL — AB (ref 65–99)
Potassium: 3.5 mmol/L (ref 3.5–5.1)
Sodium: 133 mmol/L — ABNORMAL LOW (ref 135–145)

## 2015-07-14 LAB — CBC
HCT: 41 % (ref 35.0–47.0)
HEMOGLOBIN: 14.1 g/dL (ref 12.0–16.0)
MCH: 28.7 pg (ref 26.0–34.0)
MCHC: 34.4 g/dL (ref 32.0–36.0)
MCV: 83.3 fL (ref 80.0–100.0)
Platelets: 189 10*3/uL (ref 150–440)
RBC: 4.92 MIL/uL (ref 3.80–5.20)
RDW: 14.5 % (ref 11.5–14.5)
WBC: 13 10*3/uL — ABNORMAL HIGH (ref 3.6–11.0)

## 2015-07-14 LAB — CSF CELL COUNT WITH DIFFERENTIAL
EOS CSF: 0 %
LYMPHS CSF: 100 %
MONOCYTE-MACROPHAGE-SPINAL FLUID: 0 %
OTHER CELLS CSF: 0
RBC Count, CSF: 0 /mm3 (ref 0–3)
Segmented Neutrophils-CSF: 0 %
Tube #: 3
WBC, CSF: 10 /mm3

## 2015-07-14 LAB — VITAMIN B12: VITAMIN B 12: 236 pg/mL (ref 180–914)

## 2015-07-14 LAB — PROLACTIN: PROLACTIN: 7.1 ng/mL (ref 4.8–23.3)

## 2015-07-14 LAB — APTT: aPTT: 27 seconds (ref 24–36)

## 2015-07-14 LAB — PROTIME-INR
INR: 1.12
Prothrombin Time: 14.6 seconds (ref 11.4–15.0)

## 2015-07-14 LAB — PROTEIN AND GLUCOSE, CSF
GLUCOSE CSF: 74 mg/dL — AB (ref 40–70)
TOTAL PROTEIN, CSF: 23 mg/dL (ref 15–45)

## 2015-07-14 MED ORDER — CYANOCOBALAMIN 1000 MCG/ML IJ SOLN
1000.0000 ug | Freq: Every day | INTRAMUSCULAR | Status: DC
  Administered 2015-07-14 – 2015-07-15 (×2): 1000 ug via INTRAMUSCULAR
  Filled 2015-07-14 (×2): qty 1

## 2015-07-14 NOTE — Care Management Note (Signed)
Case Management Note  Patient Details  Name: Sandra Charles MRN: [id number] Date of Birth: 15-May-1955  Subjective/Objective:       60yo Ms [name] was admitted 07/12/15 with encephalopathy of unknown cause just as she had when admitted in March 2017. Uncontrolled HTN. Hx of seizures, HTN, TIA. Remains very confused today.    Action/Plan:   Expected Discharge Date:                  Expected Discharge Plan:     In-House Referral:     Discharge planning Services     Post Acute Care Choice:    Choice offered to:     DME Arranged:    DME Agency:     HH Arranged:    [place] Agency:     Status of Service:     Medicare Important Message Given:    Date Medicare IM Given:    Medicare IM give by:    Date Additional Medicare IM Given:    Additional Medicare Important Message give by:     If discussed at [place] of Stay Meetings, dates discussed:    Additional Comments:  Smaran Gaus A, RN 07/14/2015, 9:45 AM

## 2015-07-14 NOTE — Progress Notes (Signed)
EEG completed, results pending. 

## 2015-07-14 NOTE — Progress Notes (Signed)
ELECTROLYTE CONSULT NOTE - INITIAL   Pharmacy Consult for electrolyte monitoring and replacement Indication: hypokalemia  No Known Allergies  Patient Measurements: Height: 5\' 4"  (162.6 cm) (stated) Weight: 155 lb 9.6 oz (70.58 kg) IBW/kg (Calculated) : 54.7  Vital Signs: Temp: 99.2 F (37.3 C) (05/18 0542) Temp Source: Oral (05/18 0542) BP: 168/62 mmHg (05/18 0542) Pulse Rate: 63 (05/18 0542) Intake/Output from previous day: 05/17 0701 - 05/18 0700 In: 0  Out: 350 [Urine:350] Intake/Output from this shift:    Labs:  Recent Labs  07/12/15 1335 07/13/15 0514 07/14/15 0516  WBC 19.5* 13.5* 13.0*  HGB 14.3 14.3 14.1  HCT 43.1 41.9 41.0  PLT 220 196 189  CREATININE 0.64 0.61 0.78  MG  --  2.1  --   ALBUMIN 5.1*  --   --   PROT 9.5*  --   --   AST 32  --   --   ALT 34  --   --   ALKPHOS 106  --   --   BILITOT 0.6  --   --    Estimated Creatinine Clearance: 74.8 mL/min (by C-G formula based on Cr of 0.78).  Medical History: Past Medical History  Diagnosis Date  . Hypertension   . Anxiety   . Depression   . Mini stroke ([place])   . Hyperlipidemia    Assessment: Pharmacy consulted to monitor and replace electrolytes if needed. Patient was hypokalemic with a K of 2.7 on admission.   K of 3.5 within normal limits this morning  Goal of Therapy:  Electrolytes within normal limits  Plan:  No supplementation needed at this time. Will recheck K/Mg with AM labs tomorrow.   Pharmacy will continue to monitor. Thank you for the consult.  Lenis Noon, PharmD Clinical Pharmacist 07/14/2015,9:58 AM

## 2015-07-14 NOTE — Clinical Documentation Improvement (Signed)
Internal Medicine Please update your documentation within the medical record to reflect your response to this query. Thank you  Can dx of  "Accelerated hypertension" be further clarified/ specified.  Based on the clinical findings below, please document any associated diagnoses/conditions the patient has or may have.   Hypertensive emergency  Hypertensive urgency  Other  Clinically Undetermined  Supporting Information: 07/12/15 H&P.Marland KitchenMarland Kitchen"Hypertensive encephalopathy: Achieved blood pressure control goal less than 200/100, when necessary hydralazine continue home medications.".Marland Kitchen 07/13/15 progr note.Marland KitchenMarland Kitchen"Accelerated hypertension on amlodipine"...  Please exercise your independent, professional judgment when responding. A specific answer is not anticipated or expected.  Thank You, Ermelinda Das, RN, BSN, [place] Certified Clinical Documentation Specialist [place]: Health Information Management [phone]

## 2015-07-14 NOTE — Procedures (Signed)
LP Procedure Note:  Patient has been seen and examined.  Chart has been reviewed.  LP is being performed to rule out infection.  Procedure has been explained to patient/family including risks and benefits.  Consent has been obtained by telephone from family and witnessed.   Blood pressure 181/74, pulse 74, temperature 97.9 F (36.6 C), temperature source Oral, resp. rate 18, height 5\' 4"  (1.626 m), weight 70.58 kg (155 lb 9.6 oz), SpO2 95 %.   Current facility-administered medications:  .  acetaminophen (TYLENOL) tablet 650 mg, 650 mg, Oral, Q6H PRN, 650 mg at 07/12/15 2018 **OR** acetaminophen (TYLENOL) suppository 650 mg, 650 mg, Rectal, Q6H PRN, Lytle Butte, MD, 650 mg at 07/12/15 2028 .  acyclovir (ZOVIRAX) 545 mg in dextrose 5 % 100 mL IVPB, 10 mg/kg (Ideal), Intravenous, Q8H, LAKETHIA CHILDREY, RPH, 545 mg at 07/14/15 1436 .  ALPRAZolam (XANAX) tablet 0.5 mg, 0.5 mg, Oral, TID PRN, Loletha Grayer, MD .  amLODipine (NORVASC) tablet 10 mg, 10 mg, Oral, Daily, Lytle Butte, MD, 10 mg at 07/14/15 1131 .  aspirin EC tablet 81 mg, 81 mg, Oral, Daily, Lytle Butte, MD, 81 mg at 07/14/15 1132 .  benzocaine (ORAJEL) 10 % mucosal gel 1 application, 1 application, Mouth/Throat, PRN, Lytle Butte, MD .  carvedilol (COREG) tablet 25 mg, 25 mg, Oral, BID WC, Lytle Butte, MD, 25 mg at 07/14/15 1130 .  cholecalciferol (VITAMIN D) tablet 5,000 Units, 5,000 Units, Oral, Daily, Lytle Butte, MD, 5,000 Units at 07/14/15 1128 .  citalopram (CELEXA) tablet 40 mg, 40 mg, Oral, Daily, Lytle Butte, MD, 40 mg at 07/14/15 1131 .  cyanocobalamin ((VITAMIN B-12)) injection 1,000 mcg, 1,000 mcg, Intramuscular, Daily, Loletha Grayer, MD, 1,000 mcg at 07/14/15 1244 .  doxycycline (VIBRA-TABS) tablet 100 mg, 100 mg, Oral, Q12H, Loletha Grayer, MD, 100 mg at 07/14/15 1132 .  enoxaparin (LOVENOX) injection 40 mg, 40 mg, Subcutaneous, Q24H, Lytle Butte, MD, 40 mg at 07/13/15 1637 .  hydrALAZINE (APRESOLINE)  injection 10 mg, 10 mg, Intravenous, Q4H PRN, Lytle Butte, MD .  lacosamide (VIMPAT) tablet 50 mg, 50 mg, Oral, BID, Alexis Goodell, MD, 50 mg at 07/14/15 1132 .  morphine 2 MG/ML injection 2 mg, 2 mg, Intravenous, Q4H PRN, Lytle Butte, MD .  ondansetron [facility]) tablet 4 mg, 4 mg, Oral, Q6H PRN **OR** ondansetron (ZOFRAN) injection 4 mg, 4 mg, Intravenous, Q6H PRN, Lytle Butte, MD .  oxyCODONE (Oxy IR/ROXICODONE) immediate release tablet 5 mg, 5 mg, Oral, Q4H PRN, Lytle Butte, MD .  pantoprazole (PROTONIX) EC tablet 40 mg, 40 mg, Oral, Daily, Lytle Butte, MD, 40 mg at 07/14/15 1135 .  piperacillin-tazobactam (ZOSYN) IVPB 3.375 g, 3.375 g, Intravenous, Q8H, Lytle Butte, MD, 3.375 g at 07/14/15 1436 .  potassium chloride SA (K-DUR,KLOR-CON) CR tablet 40 mEq, 40 mEq, Oral, BID, Loletha Grayer, MD, 40 mEq at 07/14/15 1130 .  sodium chloride flush (NS) 0.9 % injection 3 mL, 3 mL, Intravenous, Q12H, Lytle Butte, MD, 3 mL at 07/14/15 1135 .  vancomycin (VANCOCIN) 1,250 mg in sodium chloride 0.9 % 250 mL IVPB, 1,250 mg, Intravenous, Q12H, Lytle Butte, MD, 1,250 mg at 07/14/15 1135   Recent Labs  07/13/15 0514 07/14/15 0516 07/14/15 1223  WBC 13.5* 13.0*  --   HGB 14.3 14.1  --   HCT 41.9 41.0  --   PLT 196 189  --   INR  --   --  1.12    CT of head:  Patient was placed in the lateral decub position.  Area was cleaned with betadine and anesthetized with lidocaine.  Under sterile conditions 20G LP needle was placed at approximately L3-4 without difficulty.  Opening pressure was documented at 29.  Approximately 18cc of clear and colorless fluid was obtained and sent for studies.  No complications were noted.    Alexis Goodell, MD Neurology [phone] 07/14/2015  3:43 PM

## 2015-07-14 NOTE — Progress Notes (Signed)
Patient is still pleasantly confused.  Alert to self and person.  Lumbar puncture done at bedside today.  Site is intact, no drainage.  On RA, BP slightly elevated.  No complaints of pain.  NSR on monitor.

## 2015-07-14 NOTE — Progress Notes (Signed)
Patient ID: Sandra Charles, female   DOB: 08-26-1955, 60 y.o.   MRN: [id number] [facility] PROGRESS NOTE  Sandra Charles [id number] DOB: [birth year] DOA: 07/12/2015 PCP: Deborha Payment, MD  HPI/Subjective: Patient asked my name 4 times while was in the room. She no hernia and her date of birth. Kept on repeating the same questions over and over. As per family mental status today better than it was yesterday.  Objective: Filed Vitals:   07/14/15 0542 07/14/15 1140  BP: 168/62 181/74  Pulse: 63 74  Temp: 99.2 F (37.3 C) 97.9 F (36.6 C)  Resp: 18 18    Filed Weights   07/12/15 1409 07/12/15 1829  Weight: 69.4 kg (153 lb) 70.58 kg (155 lb 9.6 oz)    ROS: Review of Systems  Constitutional: Negative for fever and chills.  Eyes: Negative for blurred vision.  Respiratory: Negative for cough and shortness of breath.   Cardiovascular: Negative for chest pain.  Gastrointestinal: Negative for nausea, vomiting, abdominal pain, diarrhea and constipation.  Genitourinary: Negative for dysuria.  Musculoskeletal: Negative for joint pain.  Neurological: Negative for dizziness and headaches.   Exam: Physical Exam  HENT:  Nose: No mucosal edema.  Mouth/Throat: No oropharyngeal exudate or posterior oropharyngeal edema.  Eyes: Conjunctivae, EOM and lids are normal. Pupils are equal, round, and reactive to light.  Neck: No JVD present. Carotid bruit is not present. No edema present. No thyroid mass and no thyromegaly present.  Cardiovascular: S1 normal and S2 normal.  Exam reveals no gallop.   No murmur heard. Pulses:      Dorsalis pedis pulses are 2+ on the right side, and 2+ on the left side.  Respiratory: No respiratory distress. She has no wheezes. She has no rhonchi. She has no rales.  GI: Soft. Bowel sounds are normal. There is no tenderness.  Musculoskeletal:       Right ankle: She exhibits no swelling.       Left ankle: She exhibits no swelling.  Lymphadenopathy:    She  has no cervical adenopathy.  Neurological: She is alert.  Moves all extremities  Skin: Skin is warm. No rash noted. Nails show no clubbing.  Psychiatric: She has a normal mood and affect.      Data Reviewed: Basic Metabolic Panel:  Recent Labs Lab 07/12/15 1335 07/13/15 0514 07/13/15 1924 07/14/15 0516  NA 132* 131*  --  133*  K 2.7* 2.8* 2.7* 3.5  CL 93* 96*  --  97*  CO2 27 25  --  27  GLUCOSE 187* 131*  --  117*  BUN 11 9  --  18  CREATININE 0.64 0.61  --  0.78  CALCIUM 9.4 9.7  --  9.5  MG  --  2.1  --   --    Liver Function Tests:  Recent Labs Lab 07/12/15 1335  AST 32  ALT 34  ALKPHOS 106  BILITOT 0.6  PROT 9.5*  ALBUMIN 5.1*   CBC:  Recent Labs Lab 07/12/15 1335 07/13/15 0514 07/13/15 1924 07/14/15 0516  WBC 19.5* 13.5*  --  13.0*  NEUTROABS  --   --  10.7*  --   HGB 14.3 14.3  --  14.1  HCT 43.1 41.9  --  41.0  MCV 84.8 83.3  --  83.3  PLT 220 196  --  189   Cardiac Enzymes:  Recent Labs Lab 07/12/15 1901  CKTOTAL 137    CBG:  Recent Labs Lab 07/12/15 1433  GLUCAP 190*       Studies: Ct Head Wo Contrast  07/12/2015  CLINICAL DATA:  Confusion and history of prior infarcts EXAM: CT HEAD WITHOUT CONTRAST TECHNIQUE: Contiguous axial images were obtained from the base of the skull through the vertex without intravenous contrast. COMPARISON:  05/14/2015 FINDINGS: Bony calvarium is intact. No gross soft tissue abnormality is noted. No findings to suggest acute hemorrhage, acute infarction or space-occupying mass lesion are noted. IMPRESSION: No acute abnormality noted. Electronically Signed   By: Inez Catalina M.D.   On: 07/12/2015 15:15   Dg Chest Portable 1 View  07/12/2015  CLINICAL DATA:  Altered mental status. EXAM: PORTABLE CHEST 1 VIEW COMPARISON:  October 13, 2014. FINDINGS: The heart size and mediastinal contours are within normal limits. Both lungs are clear. No pneumothorax or pleural effusion is noted. The visualized skeletal  structures are unremarkable. IMPRESSION: No acute cardiopulmonary abnormality seen. Electronically Signed   By: Marijo Conception, M.D.   On: 07/12/2015 16:06   Mr Brain Ltd W/o Cm  07/13/2015  ADDENDUM REPORT: 07/13/2015 16:17 ADDENDUM: Study discussed by telephone with Dr. Alexis Goodell on 07/13/2015 At 1610 hours. She advises me that the patient has been febrile (102 F) and she started the patient on Acyclovir today. Electronically Signed   By: Genevie Ann M.D.   On: 07/13/2015 16:17  07/13/2015  CLINICAL DATA:  60 year old female with altered mental status. Confusion. Initial encounter. EXAM: MRI HEAD WITHOUT CONTRAST TECHNIQUE: Multiplanar, multiecho pulse sequences of the brain and surrounding structures were obtained without intravenous contrast. COMPARISON:  Head CT without contrast 07/12/2015. Brain MRI 05/14/2015, 05/27/2014. FINDINGS: The examination had to be discontinued prior to completion due to patient agitation. Subsequently, only axial diffusion weighted imaging was obtained. Cerebral volume appears stable. No ventriculomegaly. No intracranial mass effect is evident. Today there is abnormal increased trace diffusion signal in both hippocampal formations (series 3, image 54). ADC imaging in the same areas is isointense to mildly facilitated. No other diffusion abnormality identified. IMPRESSION: 1. Abnormal diffusion signal in both hippocampal formations. This pattern argues against vascular or ischemic etiology. Top differential considerations include status epilepticus and herpes encephalitis. 2. The examination had to be discontinued prior to completion due to patient agitation. Only axial diffusion-weighted imaging could be obtained. Electronically Signed: By: Genevie Ann M.D. On: 07/13/2015 16:00    Scheduled Meds: . acyclovir  10 mg/kg (Ideal) Intravenous Q8H  . amLODipine  10 mg Oral Daily  . aspirin EC  81 mg Oral Daily  . carvedilol  25 mg Oral BID WC  . cholecalciferol  5,000 Units Oral  Daily  . citalopram  40 mg Oral Daily  . cyanocobalamin  1,000 mcg Intramuscular Daily  . doxycycline  100 mg Oral Q12H  . enoxaparin (LOVENOX) injection  40 mg Subcutaneous Q24H  . lacosamide  50 mg Oral BID  . pantoprazole  40 mg Oral Daily  . piperacillin-tazobactam (ZOSYN)  IV  3.375 g Intravenous Q8H  . potassium chloride  40 mEq Oral BID  . sodium chloride flush  3 mL Intravenous Q12H  . vancomycin  1,250 mg Intravenous Q12H    Assessment/Plan:  1. Acute encephalopathy. Patient seems a little bit better today. 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 Neurology to proceed with a lumbar puncture. 2. Fever. Patient on triple antibiotics plus IV acyclovir. Patient had a low-grade temperature this morning of 99. 3. History of seizure on lacosamide 4. Accelerated hypertension on amlodipine 5. Severe hypokalemia  replace magnesium and potassium. Electrolyte protocol ordered. Stopped hydrochlorothiazide. Potassium better today than yesterday. 6. Hyperlipidemia unspecified. Stop pravastatin. Pravastatin can cause altered mental status also. 7. Esophageal reflux disease without esophagitis on PPI  Code Status:     Code Status Orders        Start     Ordered   07/12/15 1644  Full code   Continuous     07/12/15 1644    Code Status History    Date Active Date Inactive Code Status Order ID Comments User Context   05/14/2015  2:55 AM 05/15/2015  2:58 PM Full Code [id number]  Saundra Shelling, MD Inpatient     Family Communication: Family at bedside Disposition Plan: To be determined  Consultants:  Neurology  Infectious disease  Antibiotics:  Acyclovir  Vancomycin  Zosyn  Doxycycline  Time spent: 30 minutes  [place], [place]

## 2015-07-14 NOTE — Progress Notes (Signed)
Subjective: Patient more alert but remains confused.    Objective: Current vital signs: BP 181/74 mmHg  Pulse 74  Temp(Src) 97.9 F (36.6 C) (Oral)  Resp 18  Ht 5\' 4"  (1.626 m)  Wt 70.58 kg (155 lb 9.6 oz)  BMI 26.70 kg/m2  SpO2 95% Vital signs in last 24 hours: Temp:  [97.9 F (36.6 C)-99.7 F (37.6 C)] 97.9 F (36.6 C) (05/18 1140) Pulse Rate:  [58-74] 74 (05/18 1140) Resp:  [16-18] 18 (05/18 1140) BP: (99-181)/(56-74) 181/74 mmHg (05/18 1140) SpO2:  [93 %-99 %] 95 % (05/18 1140)  Intake/Output from previous day: 05/17 0701 - 05/18 0700 In: 0  Out: 350 [Urine:350] Intake/Output this shift:   Nutritional status: Diet Heart Room service appropriate?: Yes; Fluid consistency:: Thin  Neurologic Exam: Mental Status: Alert.  Fluent.  When asked the date says "Saturday".  When asked the year she says "Saturday" Cranial Nerves: II: Discs flat bilaterally; Visual fields grossly normal, pupils equal, round, reactive to light and accommodation III,IV, VI: ptosis not present, extra-ocular motions intact bilaterally V,VII: smile symmetric, facial light touch sensation normal bilaterally VIII: hearing normal bilaterally IX,X: gag reflex present XI: bilateral shoulder shrug XII: midline tongue extension Motor: Right :Upper extremity 5/5Left: Upper extremity 5/5 Lower extremity 5/5Lower extremity 5/5   Lab Results: Basic Metabolic Panel:  Recent Labs Lab 07/12/15 1335 07/13/15 0514 07/13/15 1924 07/14/15 0516  NA 132* 131*  --  133*  K 2.7* 2.8* 2.7* 3.5  CL 93* 96*  --  97*  CO2 27 25  --  27  GLUCOSE 187* 131*  --  117*  BUN 11 9  --  18  CREATININE 0.64 0.61  --  0.78  CALCIUM 9.4 9.7  --  9.5  MG  --  2.1  --   --     Liver Function Tests:  Recent Labs Lab 07/12/15 1335  AST 32  ALT 34  ALKPHOS 106  BILITOT 0.6  PROT 9.5*  ALBUMIN 5.1*    No results for input(s): LIPASE, AMYLASE in the last 168 hours. No results for input(s): AMMONIA in the last 168 hours.  CBC:  Recent Labs Lab 07/12/15 1335 07/13/15 0514 07/13/15 1924 07/14/15 0516  WBC 19.5* 13.5*  --  13.0*  NEUTROABS  --   --  10.7*  --   HGB 14.3 14.3  --  14.1  HCT 43.1 41.9  --  41.0  MCV 84.8 83.3  --  83.3  PLT 220 196  --  189    Cardiac Enzymes:  Recent Labs Lab 07/12/15 1901  CKTOTAL 137    Lipid Panel: No results for input(s): CHOL, TRIG, HDL, CHOLHDL, VLDL, LDLCALC in the last 168 hours.  CBG:  Recent Labs Lab 07/12/15 1433  GLUCAP 190*    Microbiology: Results for orders placed or performed during the hospital encounter of 07/19/14  Wet prep, genital     Status: Abnormal   Collection Time: 07/19/14  2:12 PM  Result Value Ref Range Status   Yeast Wet Prep HPF POC NONE SEEN NONE SEEN Final   Trich, Wet Prep NONE SEEN NONE SEEN Final   Clue Cells Wet Prep HPF POC NONE SEEN NONE SEEN Final   WBC, Wet Prep HPF POC FEW (A) NONE SEEN Final  Chlamydia/NGC rt PCR [facility])     Status: None   Collection Time: 07/19/14  2:12 PM  Result Value Ref Range Status   Specimen source GC/Chlam CERVIX  Final  Chlamydia Tr NOT DETECTED  Final   N gonorrhoeae NOT DETECTED  Final    Comment: (NOTE) 100  This methodology has not been evaluated in pregnant women or in 200  patients with a history of hysterectomy. 300 400  This methodology will not be performed on patients less than 52  years of age.     Coagulation Studies: No results for input(s): LABPROT, INR in the last 72 hours.  Imaging: Ct Head Wo Contrast  07/12/2015  CLINICAL DATA:  Confusion and history of prior infarcts EXAM: CT HEAD WITHOUT CONTRAST TECHNIQUE: Contiguous axial images were obtained from the base of the skull through the vertex without intravenous contrast. COMPARISON:  05/14/2015 FINDINGS: Bony calvarium is intact. No gross soft tissue abnormality is noted. No  findings to suggest acute hemorrhage, acute infarction or space-occupying mass lesion are noted. IMPRESSION: No acute abnormality noted. Electronically Signed   By: Inez Catalina M.D.   On: 07/12/2015 15:15   Dg Chest Portable 1 View  07/12/2015  CLINICAL DATA:  Altered mental status. EXAM: PORTABLE CHEST 1 VIEW COMPARISON:  October 13, 2014. FINDINGS: The heart size and mediastinal contours are within normal limits. Both lungs are clear. No pneumothorax or pleural effusion is noted. The visualized skeletal structures are unremarkable. IMPRESSION: No acute cardiopulmonary abnormality seen. Electronically Signed   By: Marijo Conception, M.D.   On: 07/12/2015 16:06   Mr Brain Ltd W/o Cm  07/13/2015  ADDENDUM REPORT: 07/13/2015 16:17 ADDENDUM: Study discussed by telephone with Dr. Alexis Goodell on 07/13/2015 At 1610 hours. She advises me that the patient has been febrile (102 F) and she started the patient on Acyclovir today. Electronically Signed   By: Genevie Ann M.D.   On: 07/13/2015 16:17  07/13/2015  CLINICAL DATA:  60 year old female with altered mental status. Confusion. Initial encounter. EXAM: MRI HEAD WITHOUT CONTRAST TECHNIQUE: Multiplanar, multiecho pulse sequences of the brain and surrounding structures were obtained without intravenous contrast. COMPARISON:  Head CT without contrast 07/12/2015. Brain MRI 05/14/2015, 05/27/2014. FINDINGS: The examination had to be discontinued prior to completion due to patient agitation. Subsequently, only axial diffusion weighted imaging was obtained. Cerebral volume appears stable. No ventriculomegaly. No intracranial mass effect is evident. Today there is abnormal increased trace diffusion signal in both hippocampal formations (series 3, image 54). ADC imaging in the same areas is isointense to mildly facilitated. No other diffusion abnormality identified. IMPRESSION: 1. Abnormal diffusion signal in both hippocampal formations. This pattern argues against vascular or  ischemic etiology. Top differential considerations include status epilepticus and herpes encephalitis. 2. The examination had to be discontinued prior to completion due to patient agitation. Only axial diffusion-weighted imaging could be obtained. Electronically Signed: By: Genevie Ann M.D. On: 07/13/2015 16:00    Medications:  I have reviewed the patient's current medications. Scheduled: . acyclovir  10 mg/kg (Ideal) Intravenous Q8H  . amLODipine  10 mg Oral Daily  . aspirin EC  81 mg Oral Daily  . carvedilol  25 mg Oral BID WC  . cholecalciferol  5,000 Units Oral Daily  . citalopram  40 mg Oral Daily  . cyanocobalamin  1,000 mcg Intramuscular Daily  . doxycycline  100 mg Oral Q12H  . enoxaparin (LOVENOX) injection  40 mg Subcutaneous Q24H  . lacosamide  50 mg Oral BID  . pantoprazole  40 mg Oral Daily  . piperacillin-tazobactam (ZOSYN)  IV  3.375 g Intravenous Q8H  . potassium chloride  40 mEq Oral BID  . sodium  chloride flush  3 mL Intravenous Q12H  . vancomycin  1,250 mg Intravenous Q12H    Assessment/Plan: Patient remains confused.   Fevers improved with a Tmax of 99.7.  White blood cell count improving.  MRI of the brain personally reviewed and although of poor quality does show enhancement of the hippocampi bilaterally.  Question of herpes encephalitis and status epilepticus was raised. EEG shows no evidence of status epilepticus.   Patient changed from [place] and has no complaints of side effects at this time.    Recommendations: 1.  Continue Acyclovir.  Per ID, family has now consented to LP.   2.  Stat PT, PTT, INR 3.  Continue Vimpat at current dose    LOS: 2 days   Alexis Goodell, MD Neurology [phone] 07/14/2015  12:06 PM

## 2015-07-14 NOTE — Progress Notes (Addendum)
[place] INFECTIOUS DISE[facility] PROGRESS NOTE Date of Admission:  07/12/2015     ID: Sandra Charles is a 60 y.o. female with AMS and fever Principal Problem:   Hypertensive encephalopathy   Subjective: Remains confused, had MRI yest,. Low grade fever  ROS  Unable to obtain due to confusion   Medications:  Antibiotics Given (last 72 hours)    Date/Time Action Medication Dose Rate   07/12/15 2200 Given   vancomycin (VANCOCIN) 1,250 mg in sodium chloride 0.9 % 250 mL IVPB 1,250 mg 166.7 mL/hr   07/12/15 2248 Given   piperacillin-tazobactam (ZOSYN) IVPB 3.375 g 3.375 g 12.5 mL/hr   07/13/15 0555 Given   piperacillin-tazobactam (ZOSYN) IVPB 3.375 g 3.375 g 12.5 mL/hr   07/13/15 1002 Given   vancomycin (VANCOCIN) 1,250 mg in sodium chloride 0.9 % 250 mL IVPB 1,250 mg 166.7 mL/hr   07/13/15 1414 Given   doxycycline (VIBRA-TABS) tablet 100 mg 100 mg    07/13/15 1551 Given   acyclovir (ZOVIRAX) 545 mg in dextrose 5 % 100 mL IVPB 545 mg 110.9 mL/hr   07/13/15 1636 Given   piperacillin-tazobactam (ZOSYN) IVPB 3.375 g 3.375 g 12.5 mL/hr   07/13/15 2121 Given   vancomycin (VANCOCIN) 1,250 mg in sodium chloride 0.9 % 250 mL IVPB 1,250 mg 166.7 mL/hr   07/13/15 2128 Given   doxycycline (VIBRA-TABS) tablet 100 mg 100 mg    07/13/15 2358 Given   piperacillin-tazobactam (ZOSYN) IVPB 3.375 g 3.375 g 12.5 mL/hr   07/13/15 2359 Given   acyclovir (ZOVIRAX) 545 mg in dextrose 5 % 100 mL IVPB 545 mg 110.9 mL/hr   07/14/15 0544 Given   piperacillin-tazobactam (ZOSYN) IVPB 3.375 g 3.375 g 12.5 mL/hr   07/14/15 0544 Given   acyclovir (ZOVIRAX) 545 mg in dextrose 5 % 100 mL IVPB 545 mg 110.9 mL/hr     . acyclovir  10 mg/kg (Ideal) Intravenous Q8H  . amLODipine  10 mg Oral Daily  . aspirin EC  81 mg Oral Daily  . carvedilol  25 mg Oral BID WC  . cholecalciferol  5,000 Units Oral Daily  . citalopram  40 mg Oral Daily  . doxycycline  100 mg Oral Q12H  . enoxaparin (LOVENOX) injection  40 mg  Subcutaneous Q24H  . lacosamide  50 mg Oral BID  . pantoprazole  40 mg Oral Daily  . piperacillin-tazobactam (ZOSYN)  IV  3.375 g Intravenous Q8H  . potassium chloride  40 mEq Oral BID  . sodium chloride flush  3 mL Intravenous Q12H  . vancomycin  1,250 mg Intravenous Q12H    Objective: Vital signs in last 24 hours: Temp:  [98.5 F (36.9 C)-99.7 F (37.6 C)] 99.2 F (37.3 C) (05/18 0542) Pulse Rate:  [58-73] 63 (05/18 0542) Resp:  [16-18] 18 (05/18 0542) BP: (99-168)/(56-62) 168/62 mmHg (05/18 0542) SpO2:  [93 %-99 %] 93 % (05/18 0542) Constitutional: Awake but somewhat drowsy, she thinks she is at her friend's house, cannot name the year. HENT: [place]/AT, PERRLA, no scleral icterus Mouth/Throat: Oropharynx is clear and moist. No oropharyngeal exudate.  Cardiovascular: Normal rate, regular rhythm and normal heart sounds.  Pulmonary/Chest: Effort normal and breath sounds normal. No respiratory distress. has no wheezes.  Neck supple, no nuchal rigidity Abdominal: Soft. Bowel sounds are normal. exhibits no distension. There is no tenderness.  Lymphadenopathy: no cervical adenopathy. No axillary adenopathy Neurological: Awake but somewhat drowsy, she thinks she is at her friend's house, cannot name the year. Skin: Skin is warm  and dry. No rash noted. No erythema.  Psychiatric: confused  Lab Results  Recent Labs  07/13/15 0514 07/13/15 1924 07/14/15 0516  WBC 13.5*  --  13.0*  HGB 14.3  --  14.1  HCT 41.9  --  41.0  NA 131*  --  133*  K 2.8* 2.7* 3.5  CL 96*  --  97*  CO2 25  --  27  BUN 9  --  18  CREATININE 0.61  --  0.78    Microbiology: Results for orders placed or performed during the hospital encounter of 07/19/14  Wet prep, genital     Status: Abnormal   Collection Time: 07/19/14  2:12 PM  Result Value Ref Range Status   Yeast Wet Prep HPF POC NONE SEEN NONE SEEN Final   Trich, Wet Prep NONE SEEN NONE SEEN Final   Clue Cells Wet Prep HPF POC NONE SEEN NONE  SEEN Final   WBC, Wet Prep HPF POC FEW (A) NONE SEEN Final  Chlamydia/NGC rt PCR [facility])     Status: None   Collection Time: 07/19/14  2:12 PM  Result Value Ref Range Status   Specimen source GC/Chlam CERVIX  Final   Chlamydia Tr NOT DETECTED  Final   N gonorrhoeae NOT DETECTED  Final    Comment: (NOTE) 100  This methodology has not been evaluated in pregnant women or in 200  patients with a history of hysterectomy. 300 400  This methodology will not be performed on patients less than 69  years of age.     Studies/Results: Ct Head Wo Contrast  07/12/2015  CLINICAL DATA:  Confusion and history of prior infarcts EXAM: CT HEAD WITHOUT CONTRAST TECHNIQUE: Contiguous axial images were obtained from the base of the skull through the vertex without intravenous contrast. COMPARISON:  05/14/2015 FINDINGS: Bony calvarium is intact. No gross soft tissue abnormality is noted. No findings to suggest acute hemorrhage, acute infarction or space-occupying mass lesion are noted. IMPRESSION: No acute abnormality noted. Electronically Signed   By: Inez Catalina M.D.   On: 07/12/2015 15:15   Dg Chest Portable 1 View  07/12/2015  CLINICAL DATA:  Altered mental status. EXAM: PORTABLE CHEST 1 VIEW COMPARISON:  October 13, 2014. FINDINGS: The heart size and mediastinal contours are within normal limits. Both lungs are clear. No pneumothorax or pleural effusion is noted. The visualized skeletal structures are unremarkable. IMPRESSION: No acute cardiopulmonary abnormality seen. Electronically Signed   By: Marijo Conception, M.D.   On: 07/12/2015 16:06   Mr Brain Ltd W/o Cm  07/13/2015  ADDENDUM REPORT: 07/13/2015 16:17 ADDENDUM: Study discussed by telephone with Dr. Alexis Goodell on 07/13/2015 At 1610 hours. She advises me that the patient has been febrile (102 F) and she started the patient on Acyclovir today. Electronically Signed   By: Genevie Ann M.D.   On: 07/13/2015 16:17  07/13/2015  CLINICAL DATA:  60 year old  female with altered mental status. Confusion. Initial encounter. EXAM: MRI HEAD WITHOUT CONTRAST TECHNIQUE: Multiplanar, multiecho pulse sequences of the brain and surrounding structures were obtained without intravenous contrast. COMPARISON:  Head CT without contrast 07/12/2015. Brain MRI 05/14/2015, 05/27/2014. FINDINGS: The examination had to be discontinued prior to completion due to patient agitation. Subsequently, only axial diffusion weighted imaging was obtained. Cerebral volume appears stable. No ventriculomegaly. No intracranial mass effect is evident. Today there is abnormal increased trace diffusion signal in both hippocampal formations (series 3, image 54). ADC imaging in the same areas is isointense to  mildly facilitated. No other diffusion abnormality identified. IMPRESSION: 1. Abnormal diffusion signal in both hippocampal formations. This pattern argues against vascular or ischemic etiology. Top differential considerations include status epilepticus and herpes encephalitis. 2. The examination had to be discontinued prior to completion due to patient agitation. Only axial diffusion-weighted imaging could be obtained. Electronically Signed: By: Genevie Ann M.D. On: 07/13/2015 16:00    Assessment/Plan: MADISIN SHERFEY is a 60 y.o. female admitted with AMS and WBC 19 as well as fever. Per family she was not having any confusion prior to the day of admission and was not complaining of fevers, dysuria, cough, GI issues. She was complaining of a headache but gets these frequently. She had very similar admit in March and [facility] neg. Does have + Utox with THC this admit and opiates THC last admit. She has not pets, no travel, no hiking but does work in her garden. No known tick bites. She is disabled due to anxiety per family and does not work.  Family denies etoh use.  Denies carbon monoxide exposure. HIV neg, RPR pending  MRI reveals hippocampal enhancement Recommendations Cont current abx and acyclovir  pending CSF sampling today If not consistent with bacterial infection can dc antibiotics and continue acyclovir ? Transient global amnesia as review in uptodate suggests possible hippocampal involvemnt  Thank you very much for the consult. Will follow with you.  Leonard Hendler P   07/14/2015, 10:59 AM

## 2015-07-15 ENCOUNTER — Encounter (HOSPITAL_COMMUNITY): Payer: Self-pay | Admitting: General Practice

## 2015-07-15 ENCOUNTER — Inpatient Hospital Stay (HOSPITAL_COMMUNITY)
Admission: AD | Admit: 2015-07-15 | Discharge: 2015-07-19 | DRG: 097 | Disposition: A | Discharge status: Home Health | Payer: Medicaid Other | Source: Other Acute Inpatient Hospital | Attending: Internal Medicine | Admitting: Internal Medicine

## 2015-07-15 ENCOUNTER — Inpatient Hospital Stay (HOSPITAL_COMMUNITY): Payer: Medicaid Other

## 2015-07-15 DIAGNOSIS — G9341 Metabolic encephalopathy: Secondary | ICD-10-CM | POA: Diagnosis present

## 2015-07-15 DIAGNOSIS — I10 Essential (primary) hypertension: Secondary | ICD-10-CM | POA: Diagnosis present

## 2015-07-15 DIAGNOSIS — I674 Hypertensive encephalopathy: Secondary | ICD-10-CM | POA: Diagnosis present

## 2015-07-15 DIAGNOSIS — G40909 Epilepsy, unspecified, not intractable, without status epilepticus: Secondary | ICD-10-CM | POA: Diagnosis not present

## 2015-07-15 DIAGNOSIS — G0490 Encephalitis and encephalomyelitis, unspecified: Principal | ICD-10-CM

## 2015-07-15 DIAGNOSIS — R9401 Abnormal electroencephalogram [EEG]: Secondary | ICD-10-CM | POA: Diagnosis not present

## 2015-07-15 DIAGNOSIS — B004 Herpesviral encephalitis: Secondary | ICD-10-CM | POA: Diagnosis present

## 2015-07-15 DIAGNOSIS — G9340 Encephalopathy, unspecified: Secondary | ICD-10-CM

## 2015-07-15 DIAGNOSIS — N3000 Acute cystitis without hematuria: Secondary | ICD-10-CM | POA: Diagnosis present

## 2015-07-15 DIAGNOSIS — R413 Other amnesia: Secondary | ICD-10-CM | POA: Diagnosis present

## 2015-07-15 DIAGNOSIS — I160 Hypertensive urgency: Secondary | ICD-10-CM | POA: Diagnosis present

## 2015-07-15 DIAGNOSIS — R930 Abnormal findings on diagnostic imaging of skull and head, not elsewhere classified: Secondary | ICD-10-CM | POA: Diagnosis not present

## 2015-07-15 DIAGNOSIS — R569 Unspecified convulsions: Secondary | ICD-10-CM | POA: Diagnosis not present

## 2015-07-15 DIAGNOSIS — R4182 Altered mental status, unspecified: Secondary | ICD-10-CM | POA: Diagnosis present

## 2015-07-15 DIAGNOSIS — Z7982 Long term (current) use of aspirin: Secondary | ICD-10-CM | POA: Diagnosis not present

## 2015-07-15 DIAGNOSIS — E876 Hypokalemia: Secondary | ICD-10-CM | POA: Diagnosis not present

## 2015-07-15 DIAGNOSIS — E538 Deficiency of other specified B group vitamins: Secondary | ICD-10-CM | POA: Diagnosis present

## 2015-07-15 DIAGNOSIS — K219 Gastro-esophageal reflux disease without esophagitis: Secondary | ICD-10-CM | POA: Diagnosis present

## 2015-07-15 DIAGNOSIS — R9089 Other abnormal findings on diagnostic imaging of central nervous system: Secondary | ICD-10-CM | POA: Insufficient documentation

## 2015-07-15 DIAGNOSIS — E785 Hyperlipidemia, unspecified: Secondary | ICD-10-CM | POA: Diagnosis present

## 2015-07-15 DIAGNOSIS — Z87891 Personal history of nicotine dependence: Secondary | ICD-10-CM | POA: Diagnosis not present

## 2015-07-15 DIAGNOSIS — Z8673 Personal history of transient ischemic attack (TIA), and cerebral infarction without residual deficits: Secondary | ICD-10-CM | POA: Diagnosis not present

## 2015-07-15 HISTORY — DX: Headache, unspecified: R51.9

## 2015-07-15 HISTORY — DX: Low back pain: M54.5

## 2015-07-15 HISTORY — DX: Headache: R51

## 2015-07-15 HISTORY — DX: Unspecified convulsions: R56.9

## 2015-07-15 HISTORY — DX: Low back pain, unspecified: M54.50

## 2015-07-15 HISTORY — DX: Gastro-esophageal reflux disease without esophagitis: K21.9

## 2015-07-15 HISTORY — DX: Unspecified osteoarthritis, unspecified site: M19.90

## 2015-07-15 HISTORY — DX: Other chronic pain: G89.29

## 2015-07-15 LAB — BASIC METABOLIC PANEL
ANION GAP: 9 (ref 5–15)
BUN: 17 mg/dL (ref 6–20)
CO2: 25 mmol/L (ref 22–32)
CREATININE: 1 mg/dL (ref 0.44–1.00)
Calcium: 9.7 mg/dL (ref 8.9–10.3)
Chloride: 104 mmol/L (ref 101–111)
GLUCOSE: 108 mg/dL — AB (ref 65–99)
POTASSIUM: 3.6 mmol/L (ref 3.5–5.1)
Sodium: 138 mmol/L (ref 135–145)

## 2015-07-15 LAB — CBC
HEMATOCRIT: 42 % (ref 36.0–46.0)
HEMOGLOBIN: 14.1 g/dL (ref 12.0–15.0)
MCH: 28.1 pg (ref 26.0–34.0)
MCHC: 33.6 g/dL (ref 30.0–36.0)
MCV: 83.8 fL (ref 78.0–100.0)
PLATELETS: 202 10*3/uL (ref 150–400)
RBC: 5.01 MIL/uL (ref 3.87–5.11)
RDW: 13.6 % (ref 11.5–15.5)
WBC: 12.4 10*3/uL — ABNORMAL HIGH (ref 4.0–10.5)

## 2015-07-15 LAB — LEVETIRACETAM LEVEL: Levetiracetam Lvl: 8.8 ug/mL — ABNORMAL LOW (ref 10.0–40.0)

## 2015-07-15 LAB — CREATININE, SERUM
CREATININE: 1.31 mg/dL — AB (ref 0.44–1.00)
GFR calc Af Amer: 51 mL/min — ABNORMAL LOW (ref >60)
GFR, EST NON AFRICAN AMERICAN: 44 mL/min — AB (ref >60)

## 2015-07-15 LAB — VANCOMYCIN, TROUGH: VANCOMYCIN TR: 19 ug/mL (ref 10–20)

## 2015-07-15 LAB — RPR: RPR Ser Ql: NONREACTIVE

## 2015-07-15 LAB — MAGNESIUM: Magnesium: 2.1 mg/dL (ref 1.7–2.4)

## 2015-07-15 MED ORDER — VITAMIN D 1000 UNITS PO TABS
5000.0000 [IU] | ORAL_TABLET | Freq: Every day | ORAL | Status: DC
  Administered 2015-07-16 – 2015-07-19 (×4): 5000 [IU] via ORAL
  Filled 2015-07-15 (×4): qty 5

## 2015-07-15 MED ORDER — PIPERACILLIN-TAZOBACTAM 3.375 G IVPB: 3.3750 g | Freq: Three times a day (TID) | INTRAVENOUS | Status: DC

## 2015-07-15 MED ORDER — ALPRAZOLAM 0.5 MG PO TABS
1.0000 mg | ORAL_TABLET | Freq: Three times a day (TID) | ORAL | Status: DC | PRN
  Administered 2015-07-18: 1 mg via ORAL
  Filled 2015-07-15 (×2): qty 2

## 2015-07-15 MED ORDER — CITALOPRAM HYDROBROMIDE 40 MG PO TABS
40.0000 mg | ORAL_TABLET | Freq: Every day | ORAL | Status: DC
  Administered 2015-07-16 – 2015-07-19 (×4): 40 mg via ORAL
  Filled 2015-07-15 (×4): qty 1

## 2015-07-15 MED ORDER — CLONIDINE HCL 0.1 MG PO TABS
0.1000 mg | ORAL_TABLET | Freq: Two times a day (BID) | ORAL | Status: DC
  Administered 2015-07-15 – 2015-07-19 (×8): 0.1 mg via ORAL
  Filled 2015-07-15 (×8): qty 1

## 2015-07-15 MED ORDER — DEXTROSE 5 % IV SOLN: 10.0000 mg/kg | Freq: Three times a day (TID) | INTRAVENOUS | Status: DC

## 2015-07-15 MED ORDER — ASPIRIN EC 81 MG PO TBEC
81.0000 mg | DELAYED_RELEASE_TABLET | Freq: Every day | ORAL | Status: DC
  Administered 2015-07-16 – 2015-07-19 (×4): 81 mg via ORAL
  Filled 2015-07-15 (×4): qty 1

## 2015-07-15 MED ORDER — CYANOCOBALAMIN 1000 MCG/ML IJ SOLN: 1000.0000 ug | Freq: Every day | INTRAMUSCULAR | Status: DC

## 2015-07-15 MED ORDER — CLONIDINE HCL 0.1 MG PO TABS: 0.1000 mg | ORAL_TABLET | Freq: Two times a day (BID) | ORAL | Status: DC

## 2015-07-15 MED ORDER — CYANOCOBALAMIN 1000 MCG/ML IJ SOLN
1000.0000 ug | Freq: Every day | INTRAMUSCULAR | Status: AC
  Administered 2015-07-15 – 2015-07-17 (×3): 1000 ug via INTRAMUSCULAR
  Filled 2015-07-15 (×3): qty 1

## 2015-07-15 MED ORDER — HYDROCODONE-ACETAMINOPHEN 5-325 MG PO TABS: 1.0000 | ORAL_TABLET | Freq: Four times a day (QID) | ORAL | Status: DC | PRN

## 2015-07-15 MED ORDER — PANTOPRAZOLE SODIUM 40 MG PO TBEC
40.0000 mg | DELAYED_RELEASE_TABLET | Freq: Every day | ORAL | Status: DC
  Administered 2015-07-16 – 2015-07-19 (×4): 40 mg via ORAL
  Filled 2015-07-15 (×4): qty 1

## 2015-07-15 MED ORDER — HEPARIN SODIUM (PORCINE) 5000 UNIT/ML IJ SOLN
5000.0000 [IU] | Freq: Three times a day (TID) | INTRAMUSCULAR | Status: DC
  Administered 2015-07-15 – 2015-07-19 (×11): 5000 [IU] via SUBCUTANEOUS
  Filled 2015-07-15 (×11): qty 1

## 2015-07-15 MED ORDER — POTASSIUM CHLORIDE CRYS ER 20 MEQ PO TBCR
20.0000 meq | EXTENDED_RELEASE_TABLET | Freq: Every day | ORAL | Status: DC
  Administered 2015-07-15 – 2015-07-19 (×5): 20 meq via ORAL
  Filled 2015-07-15 (×5): qty 1

## 2015-07-15 MED ORDER — LACOSAMIDE 50 MG PO TABS: 50.0000 mg | ORAL_TABLET | Freq: Two times a day (BID) | ORAL | Status: DC

## 2015-07-15 MED ORDER — CARVEDILOL 25 MG PO TABS
25.0000 mg | ORAL_TABLET | Freq: Two times a day (BID) | ORAL | Status: DC
  Administered 2015-07-15 – 2015-07-19 (×8): 25 mg via ORAL
  Filled 2015-07-15 (×8): qty 1

## 2015-07-15 MED ORDER — LACOSAMIDE 50 MG PO TABS
50.0000 mg | ORAL_TABLET | Freq: Two times a day (BID) | ORAL | Status: DC
  Administered 2015-07-15 – 2015-07-16 (×3): 50 mg via ORAL
  Filled 2015-07-15 (×3): qty 1

## 2015-07-15 MED ORDER — SODIUM CHLORIDE 0.9% FLUSH
3.0000 mL | Freq: Two times a day (BID) | INTRAVENOUS | Status: DC
  Administered 2015-07-15 – 2015-07-19 (×7): 3 mL via INTRAVENOUS

## 2015-07-15 MED ORDER — CLONIDINE HCL 0.1 MG PO TABS
0.1000 mg | ORAL_TABLET | Freq: Two times a day (BID) | ORAL | Status: DC
  Administered 2015-07-15: 0.1 mg via ORAL
  Filled 2015-07-15: qty 1

## 2015-07-15 MED ORDER — AMLODIPINE BESYLATE 10 MG PO TABS
10.0000 mg | ORAL_TABLET | Freq: Every day | ORAL | Status: DC
  Administered 2015-07-16 – 2015-07-19 (×4): 10 mg via ORAL
  Filled 2015-07-15 (×4): qty 1

## 2015-07-15 MED ORDER — DEXTROSE 5 % IV SOLN
10.0000 mg/kg | Freq: Three times a day (TID) | INTRAVENOUS | Status: DC
  Administered 2015-07-15 – 2015-07-17 (×6): 705 mg via INTRAVENOUS
  Filled 2015-07-15 (×8): qty 14.1

## 2015-07-15 MED ORDER — DOXYCYCLINE HYCLATE 100 MG PO TABS: 100.0000 mg | ORAL_TABLET | Freq: Two times a day (BID) | ORAL | Status: DC

## 2015-07-15 MED ORDER — VANCOMYCIN HCL 10 G IV SOLR: 1250.0000 mg | Freq: Two times a day (BID) | INTRAVENOUS | Status: DC

## 2015-07-15 NOTE — Progress Notes (Signed)
[facility]FECTI[facility] PROGRESS NOTE Date of Admission:  07/12/2015     ID: Sandra Charles is a 60 y.o. female with AMS and fever Principal Problem:   Hypertensive encephalopathy Active Problems:   Encephalitis   Confusion   Subjective: More alert, but still not at baseline. Was up with PT and walked in halls  LP done, no fevers   ROS  Unable to obtain due to confusion   Medications:  Antibiotics Given (last 72 hours)    Date/Time Action Medication Dose Rate   07/12/15 2200 Given   vancomycin (VANCOCIN) 1,250 mg in sodium chloride 0.9 % 250 mL IVPB 1,250 mg 166.7 mL/hr   07/12/15 2248 Given   piperacillin-tazobactam (ZOSYN) IVPB 3.375 g 3.375 g 12.5 mL/hr   07/13/15 0555 Given   piperacillin-tazobactam (ZOSYN) IVPB 3.375 g 3.375 g 12.5 mL/hr   07/13/15 1002 Given   vancomycin (VANCOCIN) 1,250 mg in sodium chloride 0.9 % 250 mL IVPB 1,250 mg 166.7 mL/hr   07/13/15 1414 Given   doxycycline (VIBRA-TABS) tablet 100 mg 100 mg    07/13/15 1551 Given   acyclovir (ZOVIRAX) 545 mg in dextrose 5 % 100 mL IVPB 545 mg 110.9 mL/hr   07/13/15 1636 Given   piperacillin-tazobactam (ZOSYN) IVPB 3.375 g 3.375 g 12.5 mL/hr   07/13/15 2121 Given   vancomycin (VANCOCIN) 1,250 mg in sodium chloride 0.9 % 250 mL IVPB 1,250 mg 166.7 mL/hr   07/13/15 2128 Given   doxycycline (VIBRA-TABS) tablet 100 mg 100 mg    07/13/15 2358 Given   piperacillin-tazobactam (ZOSYN) IVPB 3.375 g 3.375 g 12.5 mL/hr   07/13/15 2359 Given   acyclovir (ZOVIRAX) 545 mg in dextrose 5 % 100 mL IVPB 545 mg 110.9 mL/hr   07/14/15 0544 Given   piperacillin-tazobactam (ZOSYN) IVPB 3.375 g 3.375 g 12.5 mL/hr   07/14/15 0544 Given   acyclovir (ZOVIRAX) 545 mg in dextrose 5 % 100 mL IVPB 545 mg 110.9 mL/hr   07/14/15 1132 Given   doxycycline (VIBRA-TABS) tablet 100 mg 100 mg    07/14/15 1135 Given   vancomycin (VANCOCIN) 1,250 mg in sodium chloride 0.9 % 250 mL IVPB 1,250 mg 166.7 mL/hr   07/14/15 1436 Given    piperacillin-tazobactam (ZOSYN) IVPB 3.375 g 3.375 g 12.5 mL/hr   07/14/15 1436 Given   acyclovir (ZOVIRAX) 545 mg in dextrose 5 % 100 mL IVPB 545 mg 110.9 mL/hr   07/14/15 2113 Given   piperacillin-tazobactam (ZOSYN) IVPB 3.375 g 3.375 g 12.5 mL/hr   07/14/15 2113 Given   vancomycin (VANCOCIN) 1,250 mg in sodium chloride 0.9 % 250 mL IVPB 1,250 mg 166.7 mL/hr   07/14/15 2113 Given   doxycycline (VIBRA-TABS) tablet 100 mg 100 mg    07/14/15 2159 Given   acyclovir (ZOVIRAX) 545 mg in dextrose 5 % 100 mL IVPB 545 mg 110.9 mL/hr   07/15/15 0545 Given   piperacillin-tazobactam (ZOSYN) IVPB 3.375 g 3.375 g 12.5 mL/hr   07/15/15 0545 Given   acyclovir (ZOVIRAX) 545 mg in dextrose 5 % 100 mL IVPB 545 mg 110.9 mL/hr   07/15/15 1012 Given   vancomycin (VANCOCIN) 1,250 mg in sodium chloride 0.9 % 250 mL IVPB 1,250 mg 166.7 mL/hr   07/15/15 1013 Given   doxycycline (VIBRA-TABS) tablet 100 mg 100 mg      . acyclovir  10 mg/kg (Ideal) Intravenous Q8H  . amLODipine  10 mg Oral Daily  . aspirin EC  81 mg Oral Daily  . carvedilol  25  mg Oral BID WC  . cholecalciferol  5,000 Units Oral Daily  . citalopram  40 mg Oral Daily  . cloNIDine  0.1 mg Oral BID  . cyanocobalamin  1,000 mcg Intramuscular Daily  . doxycycline  100 mg Oral Q12H  . lacosamide  50 mg Oral BID  . pantoprazole  40 mg Oral Daily  . piperacillin-tazobactam (ZOSYN)  IV  3.375 g Intravenous Q8H  . potassium chloride  40 mEq Oral BID  . sodium chloride flush  3 mL Intravenous Q12H  . vancomycin  1,250 mg Intravenous Q12H    Objective: Vital signs in last 24 hours: Temp:  [98 F (36.7 C)-99.1 F (37.3 C)] 98 F (36.7 C) (05/19 1112) Pulse Rate:  [64-75] 67 (05/19 1112) Resp:  [19-21] 20 (05/19 1112) BP: (151-193)/(62-80) 176/65 mmHg (05/19 1112) SpO2:  [94 %-98 %] 94 % (05/19 1112) Constitutional: Awake but somewhat drowsy, she thinks she is at her friend's house, cannot name the year. HENT: [place]/AT, PERRLA, no scleral  icterus Mouth/Throat: Oropharynx is clear and moist. No oropharyngeal exudate.  Cardiovascular: Normal rate, regular rhythm and normal heart sounds.  Pulmonary/Chest: Effort normal and breath sounds normal. No respiratory distress. has no wheezes.  Neck supple, no nuchal rigidity Abdominal: Soft. Bowel sounds are normal. exhibits no distension. There is no tenderness.  Lymphadenopathy: no cervical adenopathy. No axillary adenopathy Neurological: Awake and alert, thinks year is 2007 and obama is president, knows at [facility]. Skin: Skin is warm and dry. No rash noted. No erythema.  Psychiatric: confused  Lab Results  Recent Labs  07/13/15 0514  07/14/15 0516 07/15/15 0447  WBC 13.5*  --  13.0*  --   HGB 14.3  --  14.1  --   HCT 41.9  --  41.0  --   NA 131*  --  133* 138  K 2.8*  < > 3.5 3.6  CL 96*  --  97* 104  CO2 25  --  27 25  BUN 9  --  18 17  CREATININE 0.61  --  0.78 1.00  < > = values in this interval not displayed.  Microbiology: Results for orders placed or performed during the hospital encounter of 07/12/15  CULTURE, BLOOD (ROUTINE X 2) w Reflex to ID Panel     Status: None (Preliminary result)   Collection Time: 07/12/15  5:00 PM  Result Value Ref Range Status   Specimen Description BLOOD RIGHT HAND  Final   Special Requests BOTTLES DRAWN AEROBIC AND ANAEROBIC  10CC  Final   Culture NO GROWTH 2 DAYS  Final   Report Status PENDING  Incomplete  CULTURE, BLOOD (ROUTINE X 2) w Reflex to ID Panel     Status: None (Preliminary result)   Collection Time: 07/12/15  5:49 PM  Result Value Ref Range Status   Specimen Description BLOOD LEFT ARM  Final   Special Requests BOTTLES DRAWN AEROBIC AND ANAEROBIC  10CC  Final   Culture NO GROWTH 2 DAYS  Final   Report Status PENDING  Incomplete  CSF culture     Status: None (Preliminary result)   Collection Time: 07/14/15  3:37 PM  Result Value Ref Range Status   Specimen Description CSF  Final   Special Requests  Normal  Final   Gram Stain   Final    WBC PRESENT,BOTH PMN AND MONONUCLEAR NO ORGANISMS SEEN CYTOSPIN SMEAR Results Called to: AMY DALTON AT 1719 07/14/15 MLZ    Culture NO GROWTH < 24  HOURS  Final   Report Status PENDING  Incomplete    Studies/Results: Mr Brain Ltd W/o Cm  07/13/2015  ADDENDUM REPORT: 07/13/2015 16:17 ADDENDUM: Study discussed by telephone with Dr. Alexis Goodell on 07/13/2015 At 1610 hours. She advises me that the patient has been febrile (102 F) and she started the patient on Acyclovir today. Electronically Signed   By: Genevie Ann M.D.   On: 07/13/2015 16:17  07/13/2015  CLINICAL DATA:  60 year old female with altered mental status. Confusion. Initial encounter. EXAM: MRI HEAD WITHOUT CONTRAST TECHNIQUE: Multiplanar, multiecho pulse sequences of the brain and surrounding structures were obtained without intravenous contrast. COMPARISON:  Head CT without contrast 07/12/2015. Brain MRI 05/14/2015, 05/27/2014. FINDINGS: The examination had to be discontinued prior to completion due to patient agitation. Subsequently, only axial diffusion weighted imaging was obtained. Cerebral volume appears stable. No ventriculomegaly. No intracranial mass effect is evident. Today there is abnormal increased trace diffusion signal in both hippocampal formations (series 3, image 54). ADC imaging in the same areas is isointense to mildly facilitated. No other diffusion abnormality identified. IMPRESSION: 1. Abnormal diffusion signal in both hippocampal formations. This pattern argues against vascular or ischemic etiology. Top differential considerations include status epilepticus and herpes encephalitis. 2. The examination had to be discontinued prior to completion due to patient agitation. Only axial diffusion-weighted imaging could be obtained. Electronically Signed: By: Genevie Ann M.D. On: 07/13/2015 16:00    Assessment/Plan: Sandra Charles is a 60 y.o. female admitted with AMS and WBC 19 as well as  fever. Per family she was not having any confusion prior to the day of admission and was not complaining of fevers, dysuria, cough, GI issues. She was complaining of a headache but gets these frequently. She had very similar admit in March and [facility] neg. Does have + Utox with THC this admit and opiates THC last admit. She has not pets, no travel, no hiking but does work in her garden. No known tick bites. She is disabled due to anxiety per family and does not work.  Family denies etoh use.  Denies carbon monoxide exposure. HIV neg, RPR pending  MRI reveals hippocampal enhancement LP with 10 wbc 100% Lymphocytes, nml prot and glucose  Recommendations Cont acyclovir pending HSV result Stop other antibiotics as no sign of bacterial meningitis Agree with transfer to [facility] for continuous EEG Other tests including arboviral and enteroviral are pending  Thank you very much for the consult. Will follow with you.  [place], Kesa Birky P   07/15/2015, 12:18 PM

## 2015-07-15 NOTE — Progress Notes (Signed)
Received report from [facility] [place].

## 2015-07-15 NOTE — Evaluation (Signed)
Physical Therapy Evaluation Patient Details Name: Sandra Charles MRN: [id number] DOB: 1955/07/22 Today's Date: 07/15/2015   History of Present Illness  Sandra Charles is a 60yo black female who comes to [facility] on 5/16 after an episode of AMS at home. She had a similar episode about 2 months back.  Pt is found to be hypertnesive upon arrival.     Clinical Impression  At evaluation, pt is received semirecumbent in bed upon entry, sister is present. The pt is awake and agreeable to participate. No acute distress noted at this time. The pt is alert and oriented x3, pleasant, conversational, and following simple and multi-step commands consistently, but requires additional time for processing. Pt reports zero falls in the last 6 months, however demonstrates patent unsteadiness on feet throughout session, as well as expressing limitations in confidence for advanced mobility tasks such as stairs. Pt grip strength is moderately weak bilat, moreso on left side (dominant); global strength as screened during functional mobility assessment presents with mild to moderate impairment as well, but pt performs all functional mobility at supervision level or greater.   Patient presenting with impairment of strength, balance, cognition, and activity tolerance, limiting ability to perform IADL and mobility tasks at  baseline level of function. Patient will benefit from skilled intervention to address the above impairments and limitations, in order to restore to prior level of function, improve patient safety upon discharge, and to decrease falls risk.       Follow Up Recommendations Home health PT (Services may be limited due to Medicaid restrctions. Consider [facility] for probonon services as needed. )    Engineer, technical sales    Recommendations for Other Services       Precautions / Restrictions Precautions Precautions: Fall      Mobility  Bed Mobility Overal bed mobility: Modified  Independent             General bed mobility comments: additional time  Transfers Overall transfer level: Modified independent               General transfer comment: additional time  Ambulation/Gait Ambulation/Gait assistance: Min guard Ambulation Distance (Feet): 250 Feet Assistive device: Rolling walker (2 wheeled);None Gait Pattern/deviations: Decreased stance time - right Gait velocity: 0.20m/s (maximal gait speed), no faster c or s AD.  Gait velocity interpretation: <1.8 ft/sec, indicative of risk for recurrent falls General Gait Details: 173ft s AD. Prolonged stance on Left LE, with difficulty pushing off and moving; She says that side is just moving slower. Very unsteady, poor confidence, and This goes away after she is given a RW for the next 80+ feet.   Stairs Stairs: Yes Stairs assistance: Min guard Stair Management: One rail Right;One rail Left;Step to pattern   General stair comments: 2 up and down, Left step to gait up, and Right step to gait down. Pt is fearful about attempting more.   Wheelchair Mobility    Modified Rankin (Stroke Patients Only)       Balance Overall balance assessment: Needs assistance   Sitting balance-Leahy Scale: Good       Standing balance-Leahy Scale: Poor                 High Level Balance Comments: multiple LOB during gait trial.              Pertinent Vitals/Pain Pain Assessment: No/denies pain    Home Living Family/patient expects to be discharged to:: Private residence Living Arrangements: Spouse/significant other (  boyfriend ) Available Help at Discharge: Family (sister (has 3, 1 in room)) Type of Home: Mobile home Home Access: Stairs to enter   [employer] of Steps: 4 in front with 1 railingl 2 in rear with no railing Home Layout: One level Home Equipment: None      Prior Function Level of Independence: Independent         Comments: Is disabled, but is unable to say why. She  says she tolerates abotu 1 hours of walking in the community for IADL before she is just worn out.      Hand Dominance   Dominant Hand: Left    Extremity/Trunk Assessment   Upper Extremity Assessment: Generalized weakness;RUE deficits/detail (MMT screening is 4/5 (breaks easily); grip is decreased 25% on dominant (left) side. ) RUE Deficits / Details: mild distal dysmetria on R when testing 5x finger to nose. Generally bradykinetic with both sides ~17s to complete.          Lower Extremity Assessment: Generalized weakness (MMT Screen is 5/5 bilat)         Communication   Communication: No difficulties (delayed and simple responses; increased processing time. )  Cognition Arousal/Alertness: Awake/alert Behavior During Therapy: WFL for tasks assessed/performed;Flat affect (seems to have some mild processing delays) Overall Cognitive Status: Within Functional Limits for tasks assessed                      General Comments      Exercises        Assessment/Plan    PT Assessment Patient needs continued PT services  PT Diagnosis Generalized weakness;Abnormality of gait;Hemiplegia dominant side;Altered mental status   PT Problem List Decreased strength;Decreased balance;Decreased activity tolerance;Decreased mobility;Decreased coordination;Decreased cognition  PT Treatment Interventions Gait training;Stair training;Functional mobility training;Therapeutic exercise;Therapeutic activities;Balance training;Neuromuscular re-education;Cognitive remediation;Patient/family education   PT Goals (Current goals can be found in the Care Plan section) Acute Rehab PT Goals Patient Stated Goal: return to PLOF PT Goal Formulation: With patient Time For Goal Achievement: 07/29/15 Potential to Achieve Goals: Fair    Frequency Min 2X/week   Barriers to discharge        Co-evaluation               End of Session Equipment Utilized During Treatment: Gait belt Activity  Tolerance: Patient tolerated treatment well;Patient limited by lethargy Patient left: in bed;with bed alarm set;with call bell/phone within reach;with family/visitor present Nurse Communication: Mobility status;Other (comment)         [id number] PT Time Calculation (min) (ACUTE ONLY): 31 min   Charges:   PT Evaluation $PT Eval Moderate Complexity: 1 Procedure PT Treatments $Therapeutic Activity: 8-22 mins   PT G Codes:        11:43 AM, 07/18/15 Etta Grandchild, PT, DPT PRN Physical Therapist - [place] License # [id number] [id number] [phone] (mobile)

## 2015-07-15 NOTE — Discharge Summary (Addendum)
[place] [facility] [place] NAME: Sandra Charles    MR#:  [id number]  DATE OF BIRTH:  11-Dec-1955  DATE OF ADMISSION:  07/12/2015 ADMITTING PHYSICIAN: Lytle Butte, MD  DATE OF DISCHARGE: 07/15/2015  PRIMARY CARE PHYSICIAN: BARNHILL, JESSICA LEE, MD    ADMISSION DIAGNOSIS:  Malignant hypertension [I10] Hypokalemia [E87.6] Confusion [R41.0] Leukocytosis [D72.829]  DISCHARGE DIAGNOSIS:  Principal Problem:   Hypertensive encephalopathy Active Problems:   Encephalitis   Confusion   SECONDARY DIAGNOSIS:   Past Medical History  Diagnosis Date  . Hypertension   . Anxiety   . Depression   . Mini stroke ([place])   . Hyperlipidemia     HOSPITAL COURSE:   1.  Acute encephalopathy. Patient has improved a little bit since coming into the hospital but short-term memory is still very impaired. Patient had an MRI of the brain that was read by the radiologist as findings in the hippocampus which could represent a herpes encephalitis versus status epilepticus and clinical correlation recommended. The patient underwent a lumbar puncture by Dr. Alexis Goodell. No bacteria seen on the smear. Lymphocytes seen in the CSF which could go along with a viral process. Dr. Doy Mince evaluated the patient on 07/15/2015 and recommended transfer to a tertiary care center for further monitoring with EEG to rule out seizure here in. She spoke with the neurologist at [facility]. I spoke with the transfer center to set up the process. Continue acyclovir IV. Patient is on Vimpat. 2. Fever. Patient was placed on triple antibiotics and acyclovir was added. Infectious disease following. I spoke with ID and can stop triple abx and continue acyclovir 3. Accelerated hypertension- patient on Coreg and Norvasc and I added clonidine today 4. Severe hypokalemia on presentation, hypomagnesemia. I stopped hydrochlorothiazide. Potassium better today. 5. Hyperlipidemia unspecified stop  pravastatin 6. Esophageal reflux disease without esophagitis on PPI 7. Vitamin B12 deficiency on and B12 for 3 days 8. Watch creatinine closely  DISCHARGE CONDITIONS:   Fair  CONSULTS OBTAINED:  Treatment Team:  Lytle Butte, MD Alexis Goodell, MD Leonel Ramsay, MD  DRUG ALLERGIES:  No Known Allergies  DISCHARGE MEDICATIONS:   Current Discharge Medication List    START taking these medications   Details  acyclovir 545 mg in dextrose 5 % 100 mL Inject 545 mg into the vein every 8 (eight) hours.    cloNIDine (CATAPRES) 0.1 MG tablet Take 1 tablet (0.1 mg total) by mouth 2 (two) times daily. Qty: 60 tablet, Refills: 11    cyanocobalamin (,VITAMIN B-12,) 1000 MCG/ML injection Inject 1 mL (1,000 mcg total) into the muscle daily. Qty: 1 mL, Refills: 0    lacosamide (VIMPAT) 50 MG TABS tablet Take 1 tablet (50 mg total) by mouth 2 (two) times daily. Qty: 60 tablet      CONTINUE these medications which have NOT CHANGED   Details  ALPRAZolam (XANAX) 1 MG tablet Take 1 mg by mouth 3 (three) times daily as needed for anxiety.    amLODipine (NORVASC) 10 MG tablet Take 10 mg by mouth daily.    aspirin EC 81 MG tablet Take 81 mg by mouth daily.     carvedilol (COREG) 25 MG tablet Take 25 mg by mouth 2 (two) times daily with a meal.    Cholecalciferol (VITAMIN D3) 5000 UNITS TABS Take 5,000 Units by mouth daily.    citalopram (CELEXA) 40 MG tablet Take 40 mg by mouth daily.    HYDROcodone-acetaminophen (NORCO) 5-325  MG tablet Take 1 tablet by mouth every 6 (six) hours as needed for moderate pain. Qty: 20 tablet, Refills: 0    pantoprazole (PROTONIX) 40 MG tablet Take 1 tablet (40 mg total) by mouth daily. Qty: 30 tablet, Refills: 11      STOP taking these medications     benzocaine (ORAJEL) 10 % mucosal gel      hydrochlorothiazide (HYDRODIURIL) 25 MG tablet      levETIRAcetam (KEPPRA) 500 MG tablet      lovastatin (MEVACOR) 40 MG tablet      QUEtiapine  (SEROQUEL) 100 MG tablet          DISCHARGE INSTRUCTIONS:   Follow-up a tertiary care center  If you experience worsening of your admission symptoms, develop shortness of breath, life threatening emergency, suicidal or homicidal thoughts you must seek medical attention immediately by calling 911 or calling your MD immediately  if symptoms less severe.  You Must read complete instructions/literature along with all the possible adverse reactions/side effects for all the Medicines you take and that have been prescribed to you. Take any new Medicines after you have completely understood and accept all the possible adverse reactions/side effects.   Please note  You were cared for by a hospitalist during your hospital stay. If you have any questions about your discharge medications or the care you received while you were in the hospital after you are discharged, you can call the unit and asked to speak with the hospitalist on call if the hospitalist that took care of you is not available. Once you are discharged, your primary care physician will handle any further medical issues. Please note that NO REFILLS for any discharge medications will be authorized once you are discharged, as it is imperative that you return to your primary care physician (or establish a relationship with a primary care physician if you do not have one) for your aftercare needs so that they can reassess your need for medications and monitor your lab values.    Today   CHIEF COMPLAINT:   Chief Complaint  Patient presents with  . Altered Mental Status    HISTORY OF PRESENT ILLNESS:  Sandra Charles  is a 60 y.o. female presented with altered mental status   VITAL SIGNS:  Blood pressure 176/65, pulse 67, temperature 98 F (36.7 C), temperature source Oral, resp. rate 20, height 5\' 4"  (1.626 m), weight 70.58 kg (155 lb 9.6 oz), SpO2 94 %.  I/O:    Intake/Output Summary (Last 24 hours) at 07/15/15 1247 Last data filed  at 07/15/15 0830  Gross per 24 hour  Intake     60 ml  Output    560 ml  Net   -500 ml    PHYSICAL EXAMINATION:  GENERAL:  60 y.o.-year-old patient lying in the bed with no acute distress.  EYES: Pupils equal, round, reactive to light and accommodation. No scleral icterus. Extraocular muscles intact.  HEENT: Head atraumatic, normocephalic. Oropharynx and nasopharynx clear.  NECK:  Supple, no jugular venous distention. No thyroid enlargement, no tenderness.  LUNGS: Normal breath sounds bilaterally, no wheezing, rales,rhonchi or crepitation. No use of accessory muscles of respiration.  CARDIOVASCULAR: S1, S2 normal. No murmurs, rubs, or gallops.  ABDOMEN: Soft, non-tender, non-distended. Bowel sounds present. No organomegaly or mass.  EXTREMITIES: No pedal edema, cyanosis, or clubbing.  NEUROLOGIC: Cranial nerves II through XII are intact. Muscle strength 5/5 in all extremities. Sensation intact. Gait not checked.  PSYCHIATRIC: The patient is alert. Short-term  memory impaired. 0 out of 3 word recall SKIN: No obvious rash, lesion, or ulcer.   DATA REVIEW:   CBC  Recent Labs Lab 07/14/15 0516  WBC 13.0*  HGB 14.1  HCT 41.0  PLT 189    Chemistries   Recent Labs Lab 07/12/15 1335  07/15/15 0447  NA 132*  < > 138  K 2.7*  < > 3.6  CL 93*  < > 104  CO2 27  < > 25  GLUCOSE 187*  < > 108*  BUN 11  < > 17  CREATININE 0.64  < > 1.00  CALCIUM 9.4  < > 9.7  MG  --   < > 2.1  AST 32  --   --   ALT 34  --   --   ALKPHOS 106  --   --   BILITOT 0.6  --   --   < > = values in this interval not displayed.   Microbiology Results  Results for orders placed or performed during the hospital encounter of 07/12/15  CULTURE, BLOOD (ROUTINE X 2) w Reflex to ID Panel     Status: None (Preliminary result)   Collection Time: 07/12/15  5:00 PM  Result Value Ref Range Status   Specimen Description BLOOD RIGHT HAND  Final   Special Requests BOTTLES DRAWN AEROBIC AND ANAEROBIC  10CC  Final    Culture NO GROWTH 2 DAYS  Final   Report Status PENDING  Incomplete  CULTURE, BLOOD (ROUTINE X 2) w Reflex to ID Panel     Status: None (Preliminary result)   Collection Time: 07/12/15  5:49 PM  Result Value Ref Range Status   Specimen Description BLOOD LEFT ARM  Final   Special Requests BOTTLES DRAWN AEROBIC AND ANAEROBIC  10CC  Final   Culture NO GROWTH 2 DAYS  Final   Report Status PENDING  Incomplete  CSF culture     Status: None (Preliminary result)   Collection Time: 07/14/15  3:37 PM  Result Value Ref Range Status   Specimen Description CSF  Final   Special Requests Normal  Final   Gram Stain   Final    WBC PRESENT,BOTH PMN AND MONONUCLEAR NO ORGANISMS SEEN CYTOSPIN SMEAR Results Called to: AMY DALTON AT [id number] 07/14/15 MLZ    Culture NO GROWTH < 24 HOURS  Final   Report Status PENDING  Incomplete    RADIOLOGY:  Mr Brain Ltd W/o Cm  07/13/2015  ADDENDUM REPORT: 07/13/2015 16:17 ADDENDUM: Study discussed by telephone with Dr. Alexis Goodell on 07/13/2015 At 1610 hours. She advises me that the patient has been febrile (102 F) and she started the patient on Acyclovir today. Electronically Signed   By: Genevie Sandra M.D.   On: 07/13/2015 16:17  07/13/2015  CLINICAL DATA:  60 year old female with altered mental status. Confusion. Initial encounter. EXAM: MRI HEAD WITHOUT CONTRAST TECHNIQUE: Multiplanar, multiecho pulse sequences of the brain and surrounding structures were obtained without intravenous contrast. COMPARISON:  Head CT without contrast 07/12/2015. Brain MRI 05/14/2015, 05/27/2014. FINDINGS: The examination had to be discontinued prior to completion due to patient agitation. Subsequently, only axial diffusion weighted imaging was obtained. Cerebral volume appears stable. No ventriculomegaly. No intracranial mass effect is evident. Today there is abnormal increased trace diffusion signal in both hippocampal formations (series 3, image 54). ADC imaging in the same areas is isointense  to mildly facilitated. No other diffusion abnormality identified. IMPRESSION: 1. Abnormal diffusion signal in both hippocampal formations. This pattern  argues against vascular or ischemic etiology. Top differential considerations include status epilepticus and herpes encephalitis. 2. The examination had to be discontinued prior to completion due to patient agitation. Only axial diffusion-weighted imaging could be obtained. Electronically Signed: By: Genevie Sandra M.D. On: 07/13/2015 16:00    Management plans discussed with the patient, family and they are in agreement.  CODE STATUS:     Code Status Orders        Start     Ordered   07/12/15 [id number]  Full code   Continuous     07/12/15 1644    Code Status History    Date Active Date Inactive Code Status Order ID Comments User Context   05/14/2015  2:55 AM 05/15/2015  2:58 PM Full Code [id number]  Saundra Shelling, MD Inpatient      TOTAL TIME TAKING CARE OF THIS PATIENT: 35 minutes.    Loletha Grayer M.D on 07/15/2015 at 12:47 PM  Between 7am to 6pm - Pager - [phone]  After 6pm go to [email or web address] - password [email or web address]  [phone]  CC: Primary care physician; Deborha Payment, MD

## 2015-07-15 NOTE — Progress Notes (Signed)
Pharmacy Antibiotic Note  Sandra Charles is a 60 y.o. female admitted on 07/15/2015 with AMS and seizure, was transferred from [facility].  Pharmacy has been consulted for acyclovir dosing to r/o herpes encephalitis. Last acyclovir dose was 05:45. HSV PCR in process.  Plan: Acyclovir 705 mg (10 mg/kg) IV q8h  Monitor renal function and clinical progress F/U results of HSV PCR     Temp (24hrs), Avg:98.3 F (36.8 C), Min:97.4 F (36.3 C), Max:99.1 F (37.3 C)   Recent Labs Lab 07/12/15 1335 07/12/15 1644 07/12/15 1901 07/13/15 0514 07/14/15 0516 07/15/15 0447 07/15/15 0949  WBC 19.5*  --   --  13.5* 13.0*  --   --   CREATININE 0.64  --   --  0.61 0.78 1.00  --   LATICACIDVEN  --  1.5 2.1*  --   --   --   --   VANCOTROUGH  --   --   --   --   --   --  19    Estimated Creatinine Clearance: 59.9 mL/min (by C-G formula based on Cr of 1).    No Known Allergies  Antimicrobials this admission: Acyclovir 5/17 >>  Doxycycline 5/17 >> 5/19 Zosyn 5/16 >> 5/19 Vancomycin 5/16 >> 5/19  Dose adjustments this admission: 5/19 VT 19  Microbiology results: 5/18 CSF: ngtd 5/16 BCx: ngtd  Thank you for allowing pharmacy to be a part of this patient's care.  [place], Pharm.D., BCPS Clinical Pharmacist Pager: [phone] 07/15/2015 3:26 PM

## 2015-07-15 NOTE — H&P (Signed)
TRH H&P   Patient Demographics:    Jordynne Freeman, is a 60 y.o. female  MRN: [id number]   DOB - 1955-06-30  Admit Date - 07/15/2015  Outpatient Primary MD for the patient is BARNHILL, Delray Alt, MD  Referring MD/NP/PA: [facility]  Patient coming from: [facility]  No chief complaint on file.     HPI:    Caleesi Glatt  is a 60 y.o. female, With past medical history of hypertension, admitted to [facility] on 5/16 for altered mental status, fever, initially thought to be often mental status secondary to hypertensive urgency giving elevated blood pressure, but in-hospital course was complicated by fever, MRI of the brain showing finding at hippocampus which could represent herpes encephalitis versus status epilepticus, he had a repeat done at [facility] with no bacteria on smear, but he'll be was significant for lymphocytes could indicate viral process, patient was seen by ID physician at the [facility] child at [facility], initially a triple antibiotic including cycling, vancomycin and Rocephin, IV acyclovir was added as well secondary to concern of HSV encephalitis, issues with significant improvement of mental status, but neurology thought seizure needed to be worked up further, so transfer requested to [facility] for continuous EEG.    Review of systems:    In addition to the HPI above,  No Fever-chills, No Headache, No changes with Vision or hearing, No problems swallowing food or Liquids, No Chest pain, Cough or Shortness of Breath, No Abdominal pain, No Nausea or Vommitting, Bowel movements are regular, No Blood in stool or Urine, No dysuria, No new skin rashes or bruises, No new joints pains-aches,  No new weakness, tingling, numbness in any extremity, No recent weight gain or loss, No polyuria, polydypsia or polyphagia, No significant Mental  Stressors.  A full 10 point Review of Systems was done, except as stated above, all other Review of Systems were negative.   With Past History of the following :    Past Medical History  Diagnosis Date  . Hypertension   . Anxiety   . Depression   . Mini stroke ([place])   . Hyperlipidemia       Past Surgical History  Procedure Laterality Date  . Vaginal hysterectomy        Social History:     Social History  Substance Use Topics  . Smoking status: Former Smoker -- 1.00 packs/day for 5 years    Types: Cigarettes  . Smokeless tobacco: Not on file  . Alcohol Use: No     Lives - At home  Mobility - independent prior to [facility] admission     Family History :     Family History  Problem Relation Age of Onset  . Hyperlipidemia Mother   . Breast cancer Mother 82  . Hypertension Mother   . Heart attack Mother     age 39's  .  Breast cancer Sister 58  . Hyperlipidemia Father   . Hypertension Father   . Heart attack Father 66      Home Medications:   Prior to Admission medications   Medication Sig Start Date End Date Taking? Authorizing Provider  acyclovir 545 mg in dextrose 5 % 100 mL Inject 545 mg into the vein every 8 (eight) hours. 07/15/15   Loletha Grayer, MD  ALPRAZolam Duanne Moron) 1 MG tablet Take 1 mg by mouth 3 (three) times daily as needed for anxiety.    Historical Provider, MD  amLODipine (NORVASC) 10 MG tablet Take 10 mg by mouth daily.    Historical Provider, MD  aspirin EC 81 MG tablet Take 81 mg by mouth daily.     Historical Provider, MD  carvedilol (COREG) 25 MG tablet Take 25 mg by mouth 2 (two) times daily with a meal.    Historical Provider, MD  Cholecalciferol (VITAMIN D3) 5000 UNITS TABS Take 5,000 Units by mouth daily.    Historical Provider, MD  citalopram (CELEXA) 40 MG tablet Take 40 mg by mouth daily.    Historical Provider, MD  cloNIDine (CATAPRES) 0.1 MG tablet Take 1 tablet (0.1 mg total) by mouth 2 (two) times daily. 07/15/15   Loletha Grayer, MD  cyanocobalamin (,VITAMIN B-12,) 1000 MCG/ML injection Inject 1 mL (1,000 mcg total) into the muscle daily. 07/15/15   Loletha Grayer, MD  HYDROcodone-acetaminophen (NORCO) 5-325 MG tablet Take 1 tablet by mouth every 6 (six) hours as needed for moderate pain. 05/14/15   Daymon Larsen, MD  lacosamide (VIMPAT) 50 MG TABS tablet Take 1 tablet (50 mg total) by mouth 2 (two) times daily. 07/15/15   Loletha Grayer, MD  pantoprazole (PROTONIX) 40 MG tablet Take 1 tablet (40 mg total) by mouth daily. 11/25/14   Wellington Hampshire, MD     Allergies:    No Known Allergies   Physical Exam:   Vitals  Blood pressure 158/72, pulse 75, temperature 98.3 F (36.8 C), temperature source Oral, resp. rate 20, height 5\' 4"  (1.626 m), weight 68.7 kg (151 lb 7.3 oz), SpO2 100 %.   1. General Well-developed female lying in bed in NAD,   2. Normal affect and insight, Not Suicidal or Homicidal, Awake Alert, Oriented X 2.  3. No F.N deficits, ALL C.Nerves Intact, Strength 5/5 all 4 extremities, Sensation intact all 4 extremities, Plantars down going.  4. Ears and Eyes appear Normal, Conjunctivae clear, PERRLA. Moist Oral Mucosa.  5. Supple Neck, No JVD, No cervical lymphadenopathy appriciated, No Carotid Bruits.  6. Symmetrical Chest wall movement, Good air movement bilaterally, CTAB.  7. RRR, No Gallops, Rubs or Murmurs, No Parasternal Heave.  8. Positive Bowel Sounds, Abdomen Soft, No tenderness, No organomegaly appriciated,No rebound -guarding or rigidity.  9.  No Cyanosis, Normal Skin Turgor, No Skin Rash or Bruise.  10. Good muscle tone,  joints appear normal , no effusions, Normal ROM.  11. No Palpable Lymph Nodes in Neck or Axillae     Data Review:    CBC  Recent Labs Lab 07/12/15 1335 07/13/15 0514 07/13/15 1924 07/14/15 0516  WBC 19.5* 13.5*  --  13.0*  HGB 14.3 14.3  --  14.1  HCT 43.1 41.9  --  41.0  PLT 220 196  --  189  MCV 84.8 83.3  --  83.3  MCH 28.2 28.4  --   28.7  MCHC 33.2 34.1  --  34.4  RDW 14.6* 14.2  --  14.5  LYMPHSABS  --   --  1.8  --   MONOABS  --   --  0.9  --    ------------------------------------------------------------------------------------------------------------------  Chemistries   Recent Labs Lab 07/12/15 1335 07/13/15 0514 07/13/15 1924 07/14/15 0516 07/15/15 0447  NA 132* 131*  --  133* 138  K 2.7* 2.8* 2.7* 3.5 3.6  CL 93* 96*  --  97* 104  CO2 27 25  --  27 25  GLUCOSE 187* 131*  --  117* 108*  BUN 11 9  --  18 17  CREATININE 0.64 0.61  --  0.78 1.00  CALCIUM 9.4 9.7  --  9.5 9.7  MG  --  2.1  --   --  2.1  AST 32  --   --   --   --   ALT 34  --   --   --   --   ALKPHOS 106  --   --   --   --   BILITOT 0.6  --   --   --   --    ------------------------------------------------------------------------------------------------------------------ estimated creatinine clearance is 59.1 mL/min (by C-G formula based on Cr of 1). ------------------------------------------------------------------------------------------------------------------  Recent Labs  07/12/15 1901  TSH 2.515    Coagulation profile  Recent Labs Lab 07/14/15 1223  INR 1.12   ------------------------------------------------------------------------------------------------------------------- No results for input(s): DDIMER in the last 72 hours. -------------------------------------------------------------------------------------------------------------------  Cardiac Enzymes No results for input(s): CKMB, TROPONINI, MYOGLOBIN in the last 168 hours.  Invalid input(s): CK ------------------------------------------------------------------------------------------------------------------ No results found for: BNP   ---------------------------------------------------------------------------------------------------------------  Urinalysis    Component Value Date/Time   COLORURINE STRAW* 07/12/2015 1518   COLORURINE Yellow  06/10/2014 0021   APPEARANCEUR CLEAR* 07/12/2015 1518   APPEARANCEUR Hazy 06/10/2014 0021   LABSPEC 1.011 07/12/2015 1518   LABSPEC 1.005 06/10/2014 0021   PHURINE 8.0 07/12/2015 1518   PHURINE 5.0 06/10/2014 0021   GLUCOSEU >500* 07/12/2015 1518   GLUCOSEU Negative 06/10/2014 0021   HGBUR 1+* 07/12/2015 1518   HGBUR 1+ 06/10/2014 0021   BILIRUBINUR NEGATIVE 07/12/2015 1518   BILIRUBINUR Negative 06/10/2014 0021   KETONESUR TRACE* 07/12/2015 1518   KETONESUR Negative 06/10/2014 0021   PROTEINUR >500* 07/12/2015 1518   PROTEINUR Negative 06/10/2014 0021   NITRITE NEGATIVE 07/12/2015 1518   NITRITE Negative 06/10/2014 0021   LEUKOCYTESUR NEGATIVE 07/12/2015 1518   LEUKOCYTESUR 2+ 06/10/2014 0021    ----------------------------------------------------------------------------------------------------------------   Imaging Results:    No results found.    Assessment & Plan:    Active Problems:   GERD (gastroesophageal reflux disease)   Essential hypertension   Altered mental status   Encephalitis   Hypokalemia  Acute encephalopathy.   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 - MRI finding  suspicious for status epilepticus, clinically does not appear likely, but as per neuro recommendation patient will have Video EEG. - Appears to be improving.  Hypertension - Acceptable, continue with Coreg, amlodipine, clonidine was added today. [facility] discharge from [facility], we will continue  GERD - PPI  Hypokalemia  - We'll start on potassium supplement, significant hypokalemia on admission at [facility], currently much improved  Vitamin B 12 deficiency - Continue supplement  History of hyperlipidemia - Pravastatin has been stopped as it may be contributing to  altered mental status  DVT Prophylaxis Heparin -SCDs  AM Labs Ordered, also please review Full Orders  Family Communication: Admission, patients condition and plan of care including tests being ordered have been discussed with the patient and son and fianc who indicate understanding and agree with the plan and Code Status.  Code Status Full  Likely DC to  Home  Condition GUARDED    Consults called: Neurology    Admission status: Inpatient  Time spent in minutes : 55 minutes   Tayari Yankee M.D on 07/15/2015 at 4:02 PM  Between 7am to 7pm - Pager - [phone]. After 7pm go to [email or web address] - password [email or web address]  [facility] - Office  [phone]

## 2015-07-15 NOTE — Progress Notes (Signed)
LTM EEG initiated. 

## 2015-07-15 NOTE — Consult Note (Signed)
NEURO HOSPITALIST CONSULT NOTE   Requestig physician: Dr. Waldron Labs   Reason for Consult: AMS/seizure    HPI:                                                                                                                                          Sandra Charles is an 60 y.o. female "who family reports went to bed normal on Monday. Awakened yesterday and was confused. Sandra Charles reports that she was delusional. Sandra Charles reports that she could not see anything as well. Remained confused for the entirety of the day and with no improvement was brought in for evaluation.  Patient was admitted in March of this year with confusion as well. At that time was found to have bitten her tongue as well. Seizure was postulated although work up was negative and the patient was started on Keppra. Per report of family the patient did worse on Keppra. She has seen her doctor since that time and it appears some changes were made but it is unclear what changes were made. It is clear that she has not been fully compliant." Patietn remains confused while at [facility] and EEG was obtained showing no evidence of seizure. LP was obtained showing increased opening pressure, wbc count of 10 (lymphs), with normal protein and glucose and herpes encephalitis could not be ruled out thus Acyclovir was continued. Due to need for LTM she was transferred to [facility] hospital.   Past Medical History  Diagnosis Date  . Hypertension   . Anxiety   . Depression   . Mini stroke ([place])   . Hyperlipidemia     Past Surgical History  Procedure Laterality Date  . Vaginal hysterectomy      Family History  Problem Relation Age of Onset  . Hyperlipidemia Mother   . Breast cancer Mother 18  . Hypertension Mother   . Heart attack Mother     age 72's  . Breast cancer Sandra Charles 61  . Hyperlipidemia Father   . Hypertension Father   . Heart attack Father 18     Social History:  reports that she has quit smoking.  Her smoking use included Cigarettes. She has a 5 pack-year smoking history. She does not have any smokeless tobacco history on file. She reports that she does not drink alcohol or use illicit drugs.  No Known Allergies  MEDICATIONS:  Scheduled: . acyclovir  10 mg/kg Intravenous Q8H  . amLODipine  10 mg Oral Daily  . aspirin EC  81 mg Oral Daily  . carvedilol  25 mg Oral BID WC  . citalopram  40 mg Oral Daily  . cloNIDine  0.1 mg Oral BID  . cyanocobalamin  1,000 mcg Intramuscular Daily  . heparin  5,000 Units Subcutaneous Q8H  . lacosamide  50 mg Oral BID  . pantoprazole  40 mg Oral Daily  . sodium chloride flush  3 mL Intravenous Q12H  . Vitamin D3  5,000 Units Oral Daily   Continuous:    ROS:                                                                                                                                       History obtained from the patient and family  General ROS: negative for - chills, fatigue, fever, night sweats, weight gain or weight loss Psychological ROS: negative for - behavioral disorder, hallucinations, memory difficulties, mood swings or suicidal ideation Ophthalmic ROS: negative for - blurry vision, double vision, eye pain or loss of vision ENT ROS: negative for - epistaxis, nasal discharge, oral lesions, sore throat, tinnitus or vertigo Allergy and Immunology ROS: negative for - hives or itchy/watery eyes Hematological and Lymphatic ROS: negative for - bleeding problems, bruising or swollen lymph nodes Endocrine ROS: negative for - galactorrhea, hair pattern changes, polydipsia/polyuria or temperature intolerance Respiratory ROS: negative for - cough, hemoptysis, shortness of breath or wheezing Cardiovascular ROS: negative for - chest pain, dyspnea on exertion, edema or irregular heartbeat Gastrointestinal ROS: negative for - abdominal  pain, diarrhea, hematemesis, nausea/vomiting or stool incontinence Genito-Urinary ROS: negative for - dysuria, hematuria, incontinence or urinary frequency/urgency Musculoskeletal ROS: negative for - joint swelling or muscular weakness Neurological ROS: as noted in HPI Dermatological ROS: negative for rash and skin lesion changes   Blood pressure 158/72, pulse 75, temperature 98.3 F (36.8 C), temperature source Oral, resp. rate 20, SpO2 100 %.   Neurologic Examination:                                                                                                      HEENT-  Normocephalic, no lesions, without obvious abnormality.  Normal external eye and conjunctiva.  Normal TM's bilaterally.  Normal auditory canals and external ears. Normal external nose, mucus membranes and septum.  Normal pharynx. Cardiovascular- S1, S2 normal, pulses palpable throughout   Lungs- chest clear, no wheezing, rales,  normal symmetric air entry Abdomen- normal findings: bowel sounds normal Extremities- no edema Lymph-no adenopathy palpable Musculoskeletal-no joint tenderness, deformity or swelling Skin-warm and dry, no hyperpigmentation, vitiligo, or suspicious lesions  Neurological Examination Mental Status: Alert, not oriented to month or year but knows she is at [facility]. Speech fluent without evidence of aphasia.  Able to follow 2 step commands without difficulty. Not repeating herself at this time.  Cranial Nerves: II: Visual fields grossly normal, pupils equal, round, reactive to light and accommodation III,IV, VI: ptosis not present, extra-ocular motions intact bilaterally V,VII: smile symmetric, facial light touch sensation normal bilaterally VIII: hearing normal bilaterally IX,X: uvula rises symmetrically XI: bilateral shoulder shrug XII: midline tongue extension Motor: Right : Upper extremity   5/5    Left:     Upper extremity   5/5  Lower extremity   5/5     Lower extremity   5/5 Tone  and bulk:normal tone throughout; no atrophy noted Sensory: Pinprick and light touch intact throughout, bilaterally Deep Tendon Reflexes: 2+ and symmetric throughout Plantars: Right: downgoing   Left: downgoing     Lab Results: Basic Metabolic Panel:  Recent Labs Lab 07/12/15 1335 07/13/15 0514 07/13/15 1924 07/14/15 0516 07/15/15 0447  NA 132* 131*  --  133* 138  K 2.7* 2.8* 2.7* 3.5 3.6  CL 93* 96*  --  97* 104  CO2 27 25  --  27 25  GLUCOSE 187* 131*  --  117* 108*  BUN 11 9  --  18 17  CREATININE 0.64 0.61  --  0.78 1.00  CALCIUM 9.4 9.7  --  9.5 9.7  MG  --  2.1  --   --  2.1    Liver Function Tests:  Recent Labs Lab 07/12/15 1335  AST 32  ALT 34  ALKPHOS 106  BILITOT 0.6  PROT 9.5*  ALBUMIN 5.1*   No results for input(s): LIPASE, AMYLASE in the last 168 hours. No results for input(s): AMMONIA in the last 168 hours.  CBC:  Recent Labs Lab 07/12/15 1335 07/13/15 0514 07/13/15 1924 07/14/15 0516  WBC 19.5* 13.5*  --  13.0*  NEUTROABS  --   --  10.7*  --   HGB 14.3 14.3  --  14.1  HCT 43.1 41.9  --  41.0  MCV 84.8 83.3  --  83.3  PLT 220 196  --  189    Cardiac Enzymes:  Recent Labs Lab 07/12/15 1901  CKTOTAL 137    Lipid Panel: No results for input(s): CHOL, TRIG, HDL, CHOLHDL, VLDL, LDLCALC in the last 168 hours.  CBG:  Recent Labs Lab 07/12/15 1433  GLUCAP 190*    Microbiology: Results for orders placed or performed during the hospital encounter of 07/12/15  CULTURE, BLOOD (ROUTINE X 2) w Reflex to ID Panel     Status: None (Preliminary result)   Collection Time: 07/12/15  5:00 PM  Result Value Ref Range Status   Specimen Description BLOOD RIGHT HAND  Final   Special Requests BOTTLES DRAWN AEROBIC AND ANAEROBIC  10CC  Final   Culture NO GROWTH 2 DAYS  Final   Report Status PENDING  Incomplete  CULTURE, BLOOD (ROUTINE X 2) w Reflex to ID Panel     Status: None (Preliminary result)   Collection Time: 07/12/15  5:49 PM   Result Value Ref Range Status   Specimen Description BLOOD LEFT ARM  Final   Special Requests BOTTLES DRAWN AEROBIC AND ANAEROBIC  10CC  Final   Culture NO GROWTH 2 DAYS  Final   Report Status PENDING  Incomplete  CSF culture     Status: None (Preliminary result)   Collection Time: 07/14/15  3:37 PM  Result Value Ref Range Status   Specimen Description CSF  Final   Special Requests Normal  Final   Gram Stain   Final    WBC PRESENT,BOTH PMN AND MONONUCLEAR NO ORGANISMS SEEN CYTOSPIN SMEAR Results Called to: AMY DALTON AT [id number] 07/14/15 MLZ    Culture NO GROWTH < 24 HOURS  Final   Report Status PENDING  Incomplete    Coagulation Studies:  Recent Labs  07/14/15 1223  LABPROT 14.6  INR 1.12    Imaging: Mr Brain Ltd W/o Cm  07/13/2015  ADDENDUM REPORT: 07/13/2015 16:17 ADDENDUM: Study discussed by telephone with Dr. Alexis Goodell on 07/13/2015 At 1610 hours. She advises me that the patient has been febrile (102 F) and she started the patient on Acyclovir today. Electronically Signed   By: Genevie Ann M.D.   On: 07/13/2015 16:17  07/13/2015  CLINICAL DATA:  60 year old female with altered mental status. Confusion. Initial encounter. EXAM: MRI HEAD WITHOUT CONTRAST TECHNIQUE: Multiplanar, multiecho pulse sequences of the brain and surrounding structures were obtained without intravenous contrast. COMPARISON:  Head CT without contrast 07/12/2015. Brain MRI 05/14/2015, 05/27/2014. FINDINGS: The examination had to be discontinued prior to completion due to patient agitation. Subsequently, only axial diffusion weighted imaging was obtained. Cerebral volume appears stable. No ventriculomegaly. No intracranial mass effect is evident. Today there is abnormal increased trace diffusion signal in both hippocampal formations (series 3, image 54). ADC imaging in the same areas is isointense to mildly facilitated. No other diffusion abnormality identified. IMPRESSION: 1. Abnormal diffusion signal in both  hippocampal formations. This pattern argues against vascular or ischemic etiology. Top differential considerations include status epilepticus and herpes encephalitis. 2. The examination had to be discontinued prior to completion due to patient agitation. Only axial diffusion-weighted imaging could be obtained. Electronically Signed: By: Genevie Ann M.D. On: 07/13/2015 16:00       Assessment and plan per attending neurologist  Etta Quill PA-C [facility] [phone]  07/15/2015, 2:56 PM   Assessment/Plan:  60 YO female with continued confusion.  LP results showed an increased opening pressure, wbc count of 10 (lymphs), with normal protein and glucose. At this time can not rule out a herpes encephalitis--labs pending.   Recommendations: 1. Would continue Acyclovir. PCR pending 2. Concern remains for seizure with mental status, ? Encephalitis and abnormal routine EEG. LT Monitoring recommended.

## 2015-07-15 NOTE — Progress Notes (Signed)
Subjective: Patient unchanged.  Continues with confusion.  Objective: Current vital signs: BP 176/65 mmHg  Pulse 67  Temp(Src) 98 F (36.7 C) (Oral)  Resp 20  Ht 5\' 4"  (1.626 m)  Wt 70.58 kg (155 lb 9.6 oz)  BMI 26.70 kg/m2  SpO2 94% Vital signs in last 24 hours: Temp:  [98 F (36.7 C)-99.1 F (37.3 C)] 98 F (36.7 C) (05/19 1112) Pulse Rate:  [64-75] 67 (05/19 1112) Resp:  [19-21] 20 (05/19 1112) BP: (151-193)/(62-80) 176/65 mmHg (05/19 1112) SpO2:  [94 %-98 %] 94 % (05/19 1112)  Intake/Output from previous day: 05/18 0701 - 05/19 0700 In: 60 [P.O.:60] Out: 560 [Urine:560] Intake/Output this shift:   Nutritional status: Diet Heart Room service appropriate?: Yes; Fluid consistency:: Thin  Neurologic Exam: Mental Status: Alert. Fluent. Repeats the same question over and over.   Cranial Nerves: II: Discs flat bilaterally; Visual fields grossly normal, pupils equal, round, reactive to light and accommodation III,IV, VI: ptosis not present, extra-ocular motions intact bilaterally V,VII: smile symmetric, facial light touch sensation normal bilaterally VIII: hearing normal bilaterally IX,X: gag reflex present XI: bilateral shoulder shrug XII: midline tongue extension Motor: Right :Upper extremity 5/5Left: Upper extremity 5/5 Lower extremity 5/5Lower extremity 5/5  Lab Results: Basic Metabolic Panel:  Recent Labs Lab 07/12/15 1335 07/13/15 0514 07/13/15 1924 07/14/15 0516 07/15/15 0447  NA 132* 131*  --  133* 138  K 2.7* 2.8* 2.7* 3.5 3.6  CL 93* 96*  --  97* 104  CO2 27 25  --  27 25  GLUCOSE 187* 131*  --  117* 108*  BUN 11 9  --  18 17  CREATININE 0.64 0.61  --  0.78 1.00  CALCIUM 9.4 9.7  --  9.5 9.7  MG  --  2.1  --   --  2.1    Liver Function Tests:  Recent Labs Lab 07/12/15 1335  AST 32  ALT 34  ALKPHOS 106  BILITOT 0.6  PROT  9.5*  ALBUMIN 5.1*   No results for input(s): LIPASE, AMYLASE in the last 168 hours. No results for input(s): AMMONIA in the last 168 hours.  CBC:  Recent Labs Lab 07/12/15 1335 07/13/15 0514 07/13/15 1924 07/14/15 0516  WBC 19.5* 13.5*  --  13.0*  NEUTROABS  --   --  10.7*  --   HGB 14.3 14.3  --  14.1  HCT 43.1 41.9  --  41.0  MCV 84.8 83.3  --  83.3  PLT 220 196  --  189    Cardiac Enzymes:  Recent Labs Lab 07/12/15 1901  CKTOTAL 137    Lipid Panel: No results for input(s): CHOL, TRIG, HDL, CHOLHDL, VLDL, LDLCALC in the last 168 hours.  CBG:  Recent Labs Lab 07/12/15 1433  GLUCAP 190*    Microbiology: Results for orders placed or performed during the hospital encounter of 07/12/15  CULTURE, BLOOD (ROUTINE X 2) w Reflex to ID Panel     Status: None (Preliminary result)   Collection Time: 07/12/15  5:00 PM  Result Value Ref Range Status   Specimen Description BLOOD RIGHT HAND  Final   Special Requests BOTTLES DRAWN AEROBIC AND ANAEROBIC  10CC  Final   Culture NO GROWTH 2 DAYS  Final   Report Status PENDING  Incomplete  CULTURE, BLOOD (ROUTINE X 2) w Reflex to ID Panel     Status: None (Preliminary result)   Collection Time: 07/12/15  5:49 PM  Result Value Ref Range Status  Specimen Description BLOOD LEFT ARM  Final   Special Requests BOTTLES DRAWN AEROBIC AND ANAEROBIC  10CC  Final   Culture NO GROWTH 2 DAYS  Final   Report Status PENDING  Incomplete  CSF culture     Status: None (Preliminary result)   Collection Time: 07/14/15  3:37 PM  Result Value Ref Range Status   Specimen Description CSF  Final   Special Requests Normal  Final   Gram Stain   Final    WBC PRESENT,BOTH PMN AND MONONUCLEAR NO ORGANISMS SEEN CYTOSPIN SMEAR Results Called to: AMY DALTON AT [id number] 07/14/15 MLZ    Culture NO GROWTH < 24 HOURS  Final   Report Status PENDING  Incomplete    Coagulation Studies:  Recent Labs  07/14/15 1223  LABPROT 14.6  INR 1.12     Imaging: Mr Brain Ltd W/o Cm  07/13/2015  ADDENDUM REPORT: 07/13/2015 16:17 ADDENDUM: Study discussed by telephone with Dr. Alexis Goodell on 07/13/2015 At 1610 hours. She advises me that the patient has been febrile (102 F) and she started the patient on Acyclovir today. Electronically Signed   By: Genevie Ann M.D.   On: 07/13/2015 16:17  07/13/2015  CLINICAL DATA:  60 year old female with altered mental status. Confusion. Initial encounter. EXAM: MRI HEAD WITHOUT CONTRAST TECHNIQUE: Multiplanar, multiecho pulse sequences of the brain and surrounding structures were obtained without intravenous contrast. COMPARISON:  Head CT without contrast 07/12/2015. Brain MRI 05/14/2015, 05/27/2014. FINDINGS: The examination had to be discontinued prior to completion due to patient agitation. Subsequently, only axial diffusion weighted imaging was obtained. Cerebral volume appears stable. No ventriculomegaly. No intracranial mass effect is evident. Today there is abnormal increased trace diffusion signal in both hippocampal formations (series 3, image 54). ADC imaging in the same areas is isointense to mildly facilitated. No other diffusion abnormality identified. IMPRESSION: 1. Abnormal diffusion signal in both hippocampal formations. This pattern argues against vascular or ischemic etiology. Top differential considerations include status epilepticus and herpes encephalitis. 2. The examination had to be discontinued prior to completion due to patient agitation. Only axial diffusion-weighted imaging could be obtained. Electronically Signed: By: Genevie Ann M.D. On: 07/13/2015 16:00    Medications:  I have reviewed the patient's current medications. Scheduled: . acyclovir  10 mg/kg (Ideal) Intravenous Q8H  . amLODipine  10 mg Oral Daily  . aspirin EC  81 mg Oral Daily  . carvedilol  25 mg Oral BID WC  . cholecalciferol  5,000 Units Oral Daily  . citalopram  40 mg Oral Daily  . cloNIDine  0.1 mg Oral BID  .  cyanocobalamin  1,000 mcg Intramuscular Daily  . lacosamide  50 mg Oral BID  . pantoprazole  40 mg Oral Daily  . potassium chloride  40 mEq Oral BID  . sodium chloride flush  3 mL Intravenous Q12H    Assessment/Plan: Confusion continues.  LP results showed an increased opening pressure, wbc count of 10 (lymphs), with normal protein and glucose.  Can not rule out a herpes encephalitis.    Recommendations: 1.  Would continue Acyclovir.  PCR pending 2.  Concern remains for seizure with mental status, ? Encephalitis and abnormal routine EEG.  Monitoring recommended.  Have spoken with neurology at [facility] for transfer    LOS: 3 days   Alexis Goodell, MD Neurology [phone] 07/15/2015  12:35 PM

## 2015-07-15 NOTE — Progress Notes (Addendum)
ELECTROLYTE CONSULT NOTE - Follow up  Pharmacy Consult for electrolyte monitoring and replacement Indication: hypokalemia  No Known Allergies  Patient Measurements: Height: 5\' 4"  (162.6 cm) (stated) Weight: 155 lb 9.6 oz (70.58 kg) IBW/kg (Calculated) : 54.7  Vital Signs: Temp: 98.6 F (37 C) (05/19 0442) Temp Source: Oral (05/19 0442) BP: 193/80 mmHg (05/19 0442) Pulse Rate: 73 (05/19 0442) Intake/Output from previous day: 05/18 0701 - 05/19 0700 In: 60 [P.O.:60] Out: 560 [Urine:560] Intake/Output from this shift:    Labs:  Recent Labs  07/12/15 1335 07/13/15 0514 07/14/15 0516 07/14/15 1223 07/15/15 0447  WBC 19.5* 13.5* 13.0*  --   --   HGB 14.3 14.3 14.1  --   --   HCT 43.1 41.9 41.0  --   --   PLT 220 196 189  --   --   APTT  --   --   --  27  --   CREATININE 0.64 0.61 0.78  --  1.00  MG  --  2.1  --   --  2.1  ALBUMIN 5.1*  --   --   --   --   PROT 9.5*  --   --   --   --   AST 32  --   --   --   --   ALT 34  --   --   --   --   ALKPHOS 106  --   --   --   --   BILITOT 0.6  --   --   --   --    Estimated Creatinine Clearance: 59.9 mL/min (by C-G formula based on Cr of 1).  Medical History: Past Medical History  Diagnosis Date  . Hypertension   . Anxiety   . Depression   . Mini stroke ([place])   . Hyperlipidemia    Assessment: Pharmacy consulted to monitor and replace electrolytes if needed. Patient was hypokalemic with a K of 2.7 on admission.   K of 3.6 & Mg of 2.1 within normal limits this morning Patient currently has orders for KCl 40 mEq PO BID  Goal of Therapy:  Electrolytes within normal limits  Plan:  Continue KCl 40 mEq PO BID No additional supplementation needed at this time. Will recheck K/Mg/Phos with AM labs tomorrow.   Pharmacy will continue to monitor.  Lenis Noon, PharmD Clinical Pharmacist 07/15/2015,10:22 AM

## 2015-07-15 NOTE — Progress Notes (Signed)
Patient to transfer to [facility] for EEG and seizure monitoring, report called to Ginger, patient will transfer via [facility].  IVs to stay in place.  Family informed of room number, [facility] called, patient is ambulatory but is at risk for a fall.  Bed alarm on until [facility] arrives

## 2015-07-15 NOTE — Plan of Care (Signed)
Problem: Acute Rehab PT Goals(only PT should resolve) Goal: Pt Will Ambulate Pt will ambulate with SPC in RUE at ModI using a 2 point gait pattern and equal step length for a distance greater than 594ft and speed > 0.33m/s to demonstrate the ability to perform safe household distance ambulation at discharge.    Goal: Pt Will Go Up/Down Stairs Pt will ascend/descend 10 stairs with least restrictive assistive device and handrail(s) as available with supervision and without LOB to demonstrate access to home/community.

## 2015-07-16 DIAGNOSIS — G0490 Encephalitis and encephalomyelitis, unspecified: Secondary | ICD-10-CM

## 2015-07-16 DIAGNOSIS — E876 Hypokalemia: Secondary | ICD-10-CM

## 2015-07-16 DIAGNOSIS — I10 Essential (primary) hypertension: Secondary | ICD-10-CM

## 2015-07-16 DIAGNOSIS — I674 Hypertensive encephalopathy: Secondary | ICD-10-CM

## 2015-07-16 DIAGNOSIS — K219 Gastro-esophageal reflux disease without esophagitis: Secondary | ICD-10-CM

## 2015-07-16 LAB — CBC
HEMATOCRIT: 42.1 % (ref 36.0–46.0)
Hemoglobin: 14 g/dL (ref 12.0–15.0)
MCH: 28 pg (ref 26.0–34.0)
MCHC: 33.3 g/dL (ref 30.0–36.0)
MCV: 84.2 fL (ref 78.0–100.0)
PLATELETS: 201 10*3/uL (ref 150–400)
RBC: 5 MIL/uL (ref 3.87–5.11)
RDW: 13.7 % (ref 11.5–15.5)
WBC: 11.4 10*3/uL — AB (ref 4.0–10.5)

## 2015-07-16 LAB — HEAVY METALS, BLOOD
Arsenic: 7 ug/L (ref 2–23)
LEAD: 1 ug/dL (ref 0–19)
Mercury: NOT DETECTED ug/L (ref 0.0–14.9)

## 2015-07-16 LAB — VITAMIN B1: Vitamin B1 (Thiamine): 144.8 nmol/L (ref 66.5–200.0)

## 2015-07-16 LAB — BASIC METABOLIC PANEL
Anion gap: 13 (ref 5–15)
BUN: 14 mg/dL (ref 6–20)
CALCIUM: 9.7 mg/dL (ref 8.9–10.3)
CO2: 23 mmol/L (ref 22–32)
Chloride: 102 mmol/L (ref 101–111)
Creatinine, Ser: 1.22 mg/dL — ABNORMAL HIGH (ref 0.44–1.00)
GFR calc Af Amer: 56 mL/min — ABNORMAL LOW (ref >60)
GFR, EST NON AFRICAN AMERICAN: 48 mL/min — AB (ref >60)
GLUCOSE: 100 mg/dL — AB (ref 65–99)
POTASSIUM: 3.8 mmol/L (ref 3.5–5.1)
SODIUM: 138 mmol/L (ref 135–145)

## 2015-07-16 LAB — HERPES SIMPLEX VIRUS(HSV) DNA BY PCR
HSV 1 DNA: NEGATIVE
HSV 2 DNA: NEGATIVE

## 2015-07-16 MED ORDER — DIVALPROEX SODIUM 500 MG PO DR TAB
500.0000 mg | DELAYED_RELEASE_TABLET | Freq: Three times a day (TID) | ORAL | Status: DC
  Administered 2015-07-16 – 2015-07-19 (×10): 500 mg via ORAL
  Filled 2015-07-16 (×10): qty 1

## 2015-07-16 MED ORDER — HYDRALAZINE HCL 20 MG/ML IJ SOLN
10.0000 mg | Freq: Four times a day (QID) | INTRAMUSCULAR | Status: DC | PRN
  Administered 2015-07-16 – 2015-07-17 (×2): 10 mg via INTRAVENOUS
  Filled 2015-07-16 (×2): qty 1

## 2015-07-16 MED ORDER — VALPROATE SODIUM 500 MG/5ML IV SOLN
1000.0000 mg | Freq: Once | INTRAVENOUS | Status: AC
  Administered 2015-07-16: 1000 mg via INTRAVENOUS
  Filled 2015-07-16: qty 10

## 2015-07-16 MED ORDER — LORAZEPAM 2 MG/ML IJ SOLN
1.0000 mg | Freq: Once | INTRAMUSCULAR | Status: AC
  Administered 2015-07-16: 1 mg via INTRAVENOUS
  Filled 2015-07-16: qty 1

## 2015-07-16 MED ORDER — DIVALPROEX SODIUM 500 MG PO DR TAB
500.0000 mg | DELAYED_RELEASE_TABLET | Freq: Two times a day (BID) | ORAL | Status: DC
  Filled 2015-07-16: qty 1

## 2015-07-16 NOTE — Progress Notes (Signed)
LTM taken down, no skin breakdown seen

## 2015-07-16 NOTE — Evaluation (Signed)
Physical Therapy Evaluation Patient Details Name: Sandra Charles MRN: [id number] DOB: 12/30/1955 Today's Date: 07/16/2015   History of Present Illness  Sandra Charles is an 60 y.o. female admitted to [facility] with confusion. Per chart: fiancee reports that she was delusional and Sister reports that she could not see anything as well.Patient remained confused while at [facility] and EEG was obtained showing no evidence of seizure. LP was obtained. Due to need for LTM she was transferred to [facility] 5/19. PMHx- admitted in March 2017 with confusion ? Seizure work up was negative at that time; HTN; TIA;     Clinical Impression  Pt admitted with above diagnosis/symptoms. Patient evaluated at [facility] yesterday by PT and today is much more steady, however remains confused and reports some dizziness. Pt currently with functional limitations due to the deficits listed below (see PT Problem List).  Pt will benefit from skilled PT to increase their independence and safety with mobility to allow discharge to the venue listed below.       Follow Up Recommendations Home health PT;Supervision/Assistance - 24 hour (24/7 due to AMS)    Equipment Recommendations  Other (comment) (TBA; functional status continues to widely fluctuate)    Recommendations for Other Services       Precautions / Restrictions Precautions Precautions: Fall      Mobility  Bed Mobility Overal bed mobility: Modified Independent                Transfers Overall transfer level: Needs assistance Equipment used: None Transfers: Sit to/from Stand Sit to Stand: Min guard         General transfer comment: reported mild dizziness  Ambulation/Gait Ambulation/Gait assistance: Min guard Ambulation Distance (Feet): 180 Feet Assistive device: None Gait Pattern/deviations: Step-through pattern;Decreased stride length   Gait velocity interpretation: <1.8 ft/sec, indicative of risk for recurrent falls General Gait Details:  very slow and could mildly incr velocity; no sway/drift  Stairs            Wheelchair Mobility    Modified Rankin (Stroke Patients Only)       Balance Overall balance assessment: Needs assistance Sitting-balance support: No upper extremity supported;Feet unsupported Sitting balance-Leahy Scale: Normal Sitting balance - Comments: donned socks in sitting    Standing balance support: No upper extremity supported Standing balance-Leahy Scale: Fair               High level balance activites: Direction changes;Turns;Head turns High Level Balance Comments: no  imbalance Standardized Balance Assessment Standardized Balance Assessment :  (unable to follow instructions/stay on task)           Pertinent Vitals/Pain Pain Assessment: No/denies pain    Home Living Family/patient expects to be discharged to:: Private residence Living Arrangements: Spouse/significant other (boyfriend) Available Help at Discharge: Family;Available 24 hours/day Type of Home: Mobile home Home Access: Stairs to enter   Entrance Stairs-Number of Steps: 4 in front with 1 railingl 2 in rear with no railing Home Layout: One level Home Equipment: None      Prior Function Level of Independence: Independent         Comments: Per [facility] evaluation: Is disabled, but is unable to say why. She says she tolerates abotu 1 hours of walking in the community for IADL before she is just worn out.      Hand Dominance        Extremity/Trunk Assessment   Upper Extremity Assessment: Defer to OT evaluation;Overall [facility] for tasks assessed  Lower Extremity Assessment: Overall WFL for tasks assessed      Cervical / Trunk Assessment: Normal  Communication   Communication: No difficulties  Cognition Arousal/Alertness: Awake/alert Behavior During Therapy: WFL for tasks assessed/performed;Flat affect Overall Cognitive Status: Impaired/Different from baseline Area of Impairment:  Orientation;Problem solving;Following commands Orientation Level: Place;Situation (knew Charles and with cues Sandra Charles; not sure why she is here)   Memory: Decreased short-term memory Following Commands: Follows one step commands with increased time;Follows multi-step commands with increased time     Problem Solving: Slow processing;Difficulty sequencing;Requires verbal cues General Comments: NIece present and reports she is better cognitively now than earlier today, however not at baseline. Reports very mild forgetfullness PTA     General Comments General comments (skin integrity, edema, etc.): niece present     Exercises        Assessment/Plan    PT Assessment Patient needs continued PT services  PT Diagnosis Difficulty walking;Generalized weakness   PT Problem List Decreased strength;Decreased balance;Decreased activity tolerance;Decreased mobility;Decreased coordination;Decreased cognition;Decreased knowledge of use of DME  PT Treatment Interventions Gait training;Stair training;Functional mobility training;Therapeutic exercise;Therapeutic activities;Balance training;Neuromuscular re-education;Cognitive remediation;Patient/family education;DME instruction   PT Goals (Current goals can be found in the Care Plan section) Acute Rehab PT Goals Patient Stated Goal: go home PT Goal Formulation: With patient Time For Goal Achievement: 07/23/15 Potential to Achieve Goals: Fair    Frequency Min 3X/week   Barriers to discharge        Co-evaluation               End of Session Equipment Utilized During Treatment: Gait belt Activity Tolerance: Patient tolerated treatment well Patient left: in chair;with call bell/phone within reach;with chair alarm set;with family/visitor present Nurse Communication: Mobility status         Time: [id number] PT Time Calculation (min) (ACUTE ONLY): 26 min   Charges:   PT Evaluation $PT Eval Low Complexity: 1 Procedure PT Treatments $Gait  Training: 8-22 mins   PT G Codes:        Alfhild Partch 08-10-15, 2:55 PM Pager [phone]

## 2015-07-16 NOTE — Progress Notes (Addendum)
PROGRESS NOTE    Sandra Charles  [id number] DOB: 1955-12-15 DOA: 07/15/2015 PCP: Deborha Payment, MD   Brief Narrative:  On 07/15/2015 by Dr. Phillips Climes Sandra Charles is a 60 y.o. female, With past medical history of hypertension, admitted to [facility] on 5/16 for altered mental status, fever, initially thought to be often mental status secondary to hypertensive urgency giving elevated blood pressure, but in-hospital course was complicated by fever, MRI of the brain showing finding at hippocampus which could represent herpes encephalitis versus status epilepticus, he had a repeat done at [facility] with no bacteria on smear, but he'll be was significant for lymphocytes could indicate viral process, patient was seen by ID physician at the fifth child at [facility], initially a triple antibiotic including cycling, vancomycin and Rocephin, IV acyclovir was added as well secondary to concern of HSV encephalitis, issues with significant improvement of mental status, but neurology thought seizure needed to be worked up further, so transfer requested to [facility] for continuous EEG.  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 -LP: 10WBC lymphocytes, normal protein and glucose -HSV negative, CSF culture shows no growth -Blood cultures from 5/16 show no growth to date -MRI Brain showed abnormal diffusion signal in both hyper Campbell formation, Suspicious for status epilepticus -Neurology consulted and appreciated, currently having continuous video EEG monitoring -Patient is still confused and does not know why she is here Sandra Charles -Continue acyclovir, Vimpat, valproate  Essential Hypertension -Continue Coreg, amlodipine, clonidine   GERD -Continue PPI  Hypokalemia  -Replaced, continue to monitor BMP   Vitamin B 12 deficiency -Continue supplement  History  of hyperlipidemia -Pravastatin has been stopped as it may be contributing to altered mental status  DVT Prophylaxis  Heparin  Code Status: Full  Family Communication: None at beside  Disposition Plan: Admitted, Pending continuous EEG and recommendations from neurology   Consultants Neurology  Procedures  EEG  Antibiotics   Anti-infectives    Start     Dose/Rate Route Frequency Ordered Stop   07/15/15 1600  acyclovir (ZOVIRAX) 705 mg in dextrose 5 % 150 mL IVPB     10 mg/kg  70.6 kg 164.1 mL/hr over 60 Minutes Intravenous Every 8 hours 07/15/15 1504     07/15/15 1515  acyclovir (ZOVIRAX) 545 mg in dextrose 5 % 100 mL IVPB  Status:  Discontinued     10 mg/kg  54.7 kg (Ideal) 110.9 mL/hr over 60 Minutes Intravenous Every 8 hours 07/15/15 1504 07/15/15 1505      Subjective:   [name] seen and examined today.  Patient has no complaints this morning. Still appears to be confused. Does not know why she is at the hospital. Cannot tell me the year or who the president is. Currently denies chest pain or shortness of breath, abdominal pain, nausea or vomiting, diarrhea, constipation, dizziness, headache, cough.   Objective:   Filed Vitals:   07/15/15 1425 07/15/15 2147 07/16/15 0616  BP: 158/72 178/60 199/65  Pulse: 75 77 93  Temp: 98.3 F (36.8 C) 98.1 F (36.7 C) 99.5 F (37.5 C)  TempSrc: Oral    Resp: 20 16 16   Height: 5\' 4"  (1.626 m)    Weight: 68.7 kg (151 lb 7.3 oz)    SpO2: 100% 100% 100%    Intake/Output Summary (Last 24 hours) at 07/16/15 1209 Last data filed at 07/16/15 0630  Gross per 24 hour  Intake  528.2 ml  Output    800 ml  Net -271.8 ml   Filed Weights   07/15/15 1425  Weight: 68.7 kg (151 lb 7.3 oz)    Exam  General: Well developed, well nourished, NAD, appears stated age  24: NCAT,mucous membranes moist.   Cardiovascular: S1 S2 auscultated, no rubs, murmurs or gallops. Regular rate and rhythm.  Respiratory: Clear to auscultation  bilaterally  Abdomen: Soft, nontender, nondistended, + bowel sounds  Extremities: warm dry without cyanosis clubbing or edema  Neuro: AAOx2 (self and place, not time), cranial nerves grossly intact. Strength 5/5 in patient's upper and lower extremities bilaterally  Skin: Without rashes exudates or nodules   Data Reviewed: I have personally reviewed following labs and imaging studies  CBC:  Recent Labs Lab 07/12/15 1335 07/13/15 0514 07/13/15 1924 07/14/15 0516 07/15/15 1612 07/16/15 0655  WBC 19.5* 13.5*  --  13.0* 12.4* 11.4*  NEUTROABS  --   --  10.7*  --   --   --   HGB 14.3 14.3  --  14.1 14.1 14.0  HCT 43.1 41.9  --  41.0 42.0 42.1  MCV 84.8 83.3  --  83.3 83.8 84.2  PLT 220 196  --  189 202 [id number]   Basic Metabolic Panel:  Recent Labs Lab 07/12/15 1335 07/13/15 0514 07/13/15 1924 07/14/15 0516 07/15/15 0447 07/15/15 1612 07/16/15 0655  NA 132* 131*  --  133* 138  --  138  K 2.7* 2.8* 2.7* 3.5 3.6  --  3.8  CL 93* 96*  --  97* 104  --  102  CO2 27 25  --  27 25  --  23  GLUCOSE 187* 131*  --  117* 108*  --  100*  BUN 11 9  --  18 17  --  14  CREATININE 0.64 0.61  --  0.78 1.00 1.31* 1.22*  CALCIUM 9.4 9.7  --  9.5 9.7  --  9.7  MG  --  2.1  --   --  2.1  --   --    GFR: Estimated Creatinine Clearance: 48.4 mL/min (by C-G formula based on Cr of 1.22). Liver Function Tests:  Recent Labs Lab 07/12/15 1335  AST 32  ALT 34  ALKPHOS 106  BILITOT 0.6  PROT 9.5*  ALBUMIN 5.1*   No results for input(s): LIPASE, AMYLASE in the last 168 hours. No results for input(s): AMMONIA in the last 168 hours. Coagulation Profile:  Recent Labs Lab 07/14/15 1223  INR 1.12   Cardiac Enzymes:  Recent Labs Lab 07/12/15 1901  CKTOTAL 137   BNP (last 3 results) No results for input(s): PROBNP in the last 8760 hours. HbA1C: No results for input(s): HGBA1C in the last 72 hours. CBG:  Recent Labs Lab 07/12/15 1433  GLUCAP 190*   Lipid Profile: No results  for input(s): CHOL, HDL, LDLCALC, TRIG, CHOLHDL, LDLDIRECT in the last 72 hours. Thyroid Function Tests: No results for input(s): TSH, T4TOTAL, FREET4, T3FREE, THYROIDAB in the last 72 hours. Anemia Panel:  Recent Labs  07/14/15 0516  VITAMINB12 236   Urine analysis:    Component Value Date/Time   COLORURINE STRAW* 07/12/2015 1518   COLORURINE Yellow 06/10/2014 0021   APPEARANCEUR CLEAR* 07/12/2015 1518   APPEARANCEUR Hazy 06/10/2014 0021   LABSPEC 1.011 07/12/2015 1518   LABSPEC 1.005 06/10/2014 0021   PHURINE 8.0 07/12/2015 1518   PHURINE 5.0 06/10/2014 0021   GLUCOSEU >500* 07/12/2015 1518   GLUCOSEU  Negative 06/10/2014 0021   HGBUR 1+* 07/12/2015 1518   HGBUR 1+ 06/10/2014 0021   BILIRUBINUR NEGATIVE 07/12/2015 1518   BILIRUBINUR Negative 06/10/2014 0021   KETONESUR TRACE* 07/12/2015 1518   KETONESUR Negative 06/10/2014 0021   PROTEINUR >500* 07/12/2015 1518   PROTEINUR Negative 06/10/2014 0021   NITRITE NEGATIVE 07/12/2015 1518   NITRITE Negative 06/10/2014 0021   LEUKOCYTESUR NEGATIVE 07/12/2015 1518   LEUKOCYTESUR 2+ 06/10/2014 0021   Sepsis Labs: @LABRCNTIP (procalcitonin:4,lacticidven:4)  ) Recent Results (from the past 240 hour(s))  CULTURE, BLOOD (ROUTINE X 2) w Reflex to ID Panel     Status: None (Preliminary result)   Collection Time: 07/12/15  5:00 PM  Result Value Ref Range Status   Specimen Description BLOOD RIGHT HAND  Final   Special Requests BOTTLES DRAWN AEROBIC AND ANAEROBIC  10CC  Final   Culture NO GROWTH 3 DAYS  Final   Report Status PENDING  Incomplete  CULTURE, BLOOD (ROUTINE X 2) w Reflex to ID Panel     Status: None (Preliminary result)   Collection Time: 07/12/15  5:49 PM  Result Value Ref Range Status   Specimen Description BLOOD LEFT ARM  Final   Special Requests BOTTLES DRAWN AEROBIC AND ANAEROBIC  10CC  Final   Culture NO GROWTH 3 DAYS  Final   Report Status PENDING  Incomplete  CSF culture     Status: None (Preliminary result)    Collection Time: 07/14/15  3:37 PM  Result Value Ref Range Status   Specimen Description CSF  Final   Special Requests Normal  Final   Gram Stain   Final    WBC PRESENT,BOTH PMN AND MONONUCLEAR NO ORGANISMS SEEN CYTOSPIN SMEAR Results Called to: AMY DALTON AT [id number] 07/14/15 MLZ    Culture NO GROWTH 2 DAYS  Final   Report Status PENDING  Incomplete      Radiology Studies: No results found.   Scheduled Meds: . acyclovir  10 mg/kg Intravenous Q8H  . amLODipine  10 mg Oral Daily  . aspirin EC  81 mg Oral Daily  . carvedilol  25 mg Oral BID WC  . cholecalciferol  5,000 Units Oral Daily  . citalopram  40 mg Oral Daily  . cloNIDine  0.1 mg Oral BID  . cyanocobalamin  1,000 mcg Intramuscular Daily  . divalproex  500 mg Oral Q12H  . heparin  5,000 Units Subcutaneous Q8H  . lacosamide  50 mg Oral BID  . pantoprazole  40 mg Oral Daily  . potassium chloride  20 mEq Oral Daily  . sodium chloride flush  3 mL Intravenous Q12H  . valproate sodium  1,000 mg Intravenous Once   Continuous Infusions:    LOS: 1 day   Time Spent in minutes   30 minutes  Dyke Weible D.O. on 07/16/2015 at 12:09 PM  Between 7am to 7pm - Pager - [phone]  After 7pm go to [email or web address] - password [email or web address]  And look for the night coverage person covering for me after hours  [facility] Office  [phone]

## 2015-07-16 NOTE — Procedures (Signed)
Electroencephalogram report- LTM  Ordering Physician : Dr. Joylene Igo    Beginning date or time: 07/15/2015 3:30PM Ending date or time: 07/16/2015 11AM  Day of study: day 1  Medications include: Per EMR  HISTORY: This 24 hours of intensive EEG monitoring with simultaneous video monitoring was performed for this patient with altered mental status. This EEG was requested to rule out subclinical electrographic seizures.  TECHNICAL DESCRIPTION:  The study consists of a continuous 16-channel multi-montage digital video EEG recording with twenty-one electrodes placed according to the International 10-20 System. Additional leads included eye leads, true temporal leads (T1, T2), and an EKG lead. Activation procedures were not done due to mental status.  REPORT: The background activity in this tracing consisted of polymorphic 6-7 Hz theta background. The activity seemed to be somewhat reactive to tactile stimuli. Persistent focal delta and theta activity was seen in the left temporal region.  Occasional theta and delta activity was seen independently in the right temporal region.  Occasional spike wave discharges were also seen in the left mid temporal region, phase reversing at T3 electrode in the bipolar longitudinal montage.  No clear electrographic seizures were seen.  There was one pushbutton event at 8:53 AM on 07/16/15. No clear clinical seizure-like activity was seen on the camera.  Audio is not available, the reason for pushbutton is unclear.   No clear change in the background or increased epileptiform activity was seen on the EEG around the episode.  IMPRESSION: This is an abnormal EEG due to: 1) Focal slowing, sharp waves in the left temporal region 2) Focal slowing in the right temporal region 3) Diffuse slowing.  CLINICAL CORRELATION: This EEG is consistent with Focal neuronal dysfunction in the bilateral temporal regions, left more than right.  There is also suggestion of cortical  irritability in the left mid-temporal region. No electrographic seizures were seen.

## 2015-07-16 NOTE — Progress Notes (Signed)
Interval History:                                                                                                                      Sandra Charles is an 60 y.o. female patient transferred from [facility] for further neurodiagnostic workup with the EEG due to altered mental status symptoms. She was placed on long-term EEG monitoring overnight, which showed bilateral, left greater than right parietotemporal slowing and abnormal Discharges, No Electrographic Seizures Noted. Clinically Patient Was Not Noted to Have Any Episodes of Worsening Mental Status Changes or Convulsions. Because of abnormal EEG, started on a loading dose of IV Depacon 1 g this morning with a maintenance dose of 500 mg every 8 hours ordered. She was previously on Vimpat 50 mg twice a day started by Dr. Ebony Hail [facility].    After the loading dose of IV Depacon, patient was noted to be more active, almost at her baseline mental status, able to ambulate without difficulty in the hallway. Her son was at bedside reported significant improvement of her overall mental status and level of functioning today. His very happy with the progress.   Past Medical History: Past Medical History  Diagnosis Date  . Hypertension   . Anxiety   . Depression   . Mini stroke ([place])     "several since 05/2014" (07/15/2015)  . Hyperlipidemia   . GERD (gastroesophageal reflux disease)   . Headache     "weekly" (07/15/2015)  . Seizures ([place]) dx'd 04/2015  . Arthritis     "all over"  . Chronic lower back pain     Past Surgical History  Procedure Laterality Date  . Vaginal hysterectomy    . Tubal ligation    . Dilation and curettage of uterus    . Lumbar puncture  07/14/2015    Family History: Family History  Problem Relation Age of Onset  . Hyperlipidemia Mother   . Breast cancer Mother 62  . Hypertension Mother   . Heart attack Mother     age 47's  . Breast cancer Sister 60  . Hyperlipidemia Father   . Hypertension  Father   . Heart attack Father 53    Social History:   reports that she has quit smoking. Her smoking use included Cigarettes. She has a 5 pack-year smoking history. She has never used smokeless tobacco. She reports that she drinks alcohol. She reports that she does not use illicit drugs.  Allergies:  No Known Allergies   Medications:  Current facility-administered medications:  .  acyclovir (ZOVIRAX) 705 mg in dextrose 5 % 150 mL IVPB, 10 mg/kg, Intravenous, Q8H, Marliss Coots, PA-C, 705 mg at 07/16/15 1703 .  ALPRAZolam (XANAX) tablet 1 mg, 1 mg, Oral, TID PRN, Silver Huguenin Elgergawy, MD .  amLODipine (NORVASC) tablet 10 mg, 10 mg, Oral, Daily, Albertine Patricia, MD, 10 mg at 07/16/15 1235 .  aspirin EC tablet 81 mg, 81 mg, Oral, Daily, Albertine Patricia, MD, 81 mg at 07/16/15 1234 .  carvedilol (COREG) tablet 25 mg, 25 mg, Oral, BID WC, Albertine Patricia, MD, 25 mg at 07/16/15 1832 .  cholecalciferol (VITAMIN D) tablet 5,000 Units, 5,000 Units, Oral, Daily, Albertine Patricia, MD, 5,000 Units at 07/16/15 1234 .  citalopram (CELEXA) tablet 40 mg, 40 mg, Oral, Daily, Albertine Patricia, MD, 40 mg at 07/16/15 1234 .  cloNIDine (CATAPRES) tablet 0.1 mg, 0.1 mg, Oral, BID, Albertine Patricia, MD, 0.1 mg at 07/16/15 1235 .  cyanocobalamin ((VITAMIN B-12)) injection 1,000 mcg, 1,000 mcg, Intramuscular, Daily, Albertine Patricia, MD, 1,000 mcg at 07/16/15 1235 .  divalproex (DEPAKOTE) DR tablet 500 mg, 500 mg, Oral, Q8H, Francenia Chimenti Fuller Mandril, MD .  heparin injection 5,000 Units, 5,000 Units, Subcutaneous, Q8H, Albertine Patricia, MD, 5,000 Units at 07/16/15 1452 .  hydrALAZINE (APRESOLINE) injection 10 mg, 10 mg, Intravenous, Q6H PRN, Dianne Dun, NP, 10 mg at 07/16/15 0846 .  HYDROcodone-acetaminophen (NORCO/VICODIN) 5-325 MG per tablet 1 tablet, 1 tablet,  Oral, Q6H PRN, Silver Huguenin Elgergawy, MD .  lacosamide (VIMPAT) tablet 50 mg, 50 mg, Oral, BID, Albertine Patricia, MD, 50 mg at 07/16/15 1234 .  LORazepam (ATIVAN) injection 1 mg, 1 mg, Intravenous, Once, Alesana Magistro Yahoo, MD .  pantoprazole (PROTONIX) EC tablet 40 mg, 40 mg, Oral, Daily, Albertine Patricia, MD, 40 mg at 07/16/15 1235 .  potassium chloride SA (K-DUR,KLOR-CON) CR tablet 20 mEq, 20 mEq, Oral, Daily, Albertine Patricia, MD, 20 mEq at 07/16/15 1235 .  sodium chloride flush (NS) 0.9 % injection 3 mL, 3 mL, Intravenous, Q12H, Silver Huguenin Elgergawy, MD, 3 mL at 07/16/15 1250   Neurologic Examination:                                                                                                     Today's Vitals   07/16/15 0852 07/16/15 1231 07/16/15 1609 07/16/15 1831  BP:  161/71 156/68 147/61  Pulse:  79 81 89  Temp:   98 F (36.7 C)   TempSrc:      Resp:   20   Height:      Weight:      SpO2:   100%   PainSc: 0-No pain       Evaluation of higher integrative functions including: Level of alertness: Alert,  Oriented to time, place and person Speech: fluent, no evidence of dysarthria or aphasia noted.  Test the following cranial nerves: 2-12 grossly intact Motor examination: full 5/5 motor strength in all 4 extremities Test coordination: Normal finger nose testing, with no  evidence of limb appendicular ataxia or abnormal involuntary movements or tremors noted.    Lab Results: Basic Metabolic Panel:  Recent Labs Lab 07/12/15 1335 07/13/15 0514 07/13/15 1924 07/14/15 0516 07/15/15 0447 07/15/15 1612 07/16/15 0655  NA 132* 131*  --  133* 138  --  138  K 2.7* 2.8* 2.7* 3.5 3.6  --  3.8  CL 93* 96*  --  97* 104  --  102  CO2 27 25  --  27 25  --  23  GLUCOSE 187* 131*  --  117* 108*  --  100*  BUN 11 9  --  18 17  --  14  CREATININE 0.64 0.61  --  0.78 1.00 1.31* 1.22*  CALCIUM 9.4 9.7  --  9.5 9.7  --  9.7  MG  --  2.1  --   --  2.1  --   --      Liver Function Tests:  Recent Labs Lab 07/12/15 1335  AST 32  ALT 34  ALKPHOS 106  BILITOT 0.6  PROT 9.5*  ALBUMIN 5.1*   No results for input(s): LIPASE, AMYLASE in the last 168 hours. No results for input(s): AMMONIA in the last 168 hours.  CBC:  Recent Labs Lab 07/12/15 1335 07/13/15 0514 07/13/15 1924 07/14/15 0516 07/15/15 1612 07/16/15 0655  WBC 19.5* 13.5*  --  13.0* 12.4* 11.4*  NEUTROABS  --   --  10.7*  --   --   --   HGB 14.3 14.3  --  14.1 14.1 14.0  HCT 43.1 41.9  --  41.0 42.0 42.1  MCV 84.8 83.3  --  83.3 83.8 84.2  PLT 220 196  --  189 202 201    Cardiac Enzymes:  Recent Labs Lab 07/12/15 1901  CKTOTAL 137    Lipid Panel: No results for input(s): CHOL, TRIG, HDL, CHOLHDL, VLDL, LDLCALC in the last 168 hours.  CBG:  Recent Labs Lab 07/12/15 1433  GLUCAP 190*    Microbiology: Results for orders placed or performed during the hospital encounter of 07/12/15  CULTURE, BLOOD (ROUTINE X 2) w Reflex to ID Panel     Status: None (Preliminary result)   Collection Time: 07/12/15  5:00 PM  Result Value Ref Range Status   Specimen Description BLOOD RIGHT HAND  Final   Special Requests BOTTLES DRAWN AEROBIC AND ANAEROBIC  10CC  Final   Culture NO GROWTH 4 DAYS  Final   Report Status PENDING  Incomplete  CULTURE, BLOOD (ROUTINE X 2) w Reflex to ID Panel     Status: None (Preliminary result)   Collection Time: 07/12/15  5:49 PM  Result Value Ref Range Status   Specimen Description BLOOD LEFT ARM  Final   Special Requests BOTTLES DRAWN AEROBIC AND ANAEROBIC  10CC  Final   Culture NO GROWTH 4 DAYS  Final   Report Status PENDING  Incomplete  CSF culture     Status: None (Preliminary result)   Collection Time: 07/14/15  3:37 PM  Result Value Ref Range Status   Specimen Description CSF  Final   Special Requests Normal  Final   Gram Stain   Final    WBC PRESENT,BOTH PMN AND MONONUCLEAR NO ORGANISMS SEEN CYTOSPIN SMEAR Results Called to: AMY  DALTON AT [id number] 07/14/15 MLZ    Culture NO GROWTH 2 DAYS  Final   Report Status PENDING  Incomplete    Imaging: Mr Brain Ltd W/o Cm  07/13/2015  ADDENDUM REPORT:  07/13/2015 16:17 ADDENDUM: Study discussed by telephone with Dr. Alexis Goodell on 07/13/2015 At 1610 hours. She advises me that the patient has been febrile (102 F) and she started the patient on Acyclovir today. Electronically Signed   By: Genevie Ann M.D.   On: 07/13/2015 16:17  07/13/2015  CLINICAL DATA:  60 year old female with altered mental status. Confusion. Initial encounter. EXAM: MRI HEAD WITHOUT CONTRAST TECHNIQUE: Multiplanar, multiecho pulse sequences of the brain and surrounding structures were obtained without intravenous contrast. COMPARISON:  Head CT without contrast 07/12/2015. Brain MRI 05/14/2015, 05/27/2014. FINDINGS: The examination had to be discontinued prior to completion due to patient agitation. Subsequently, only axial diffusion weighted imaging was obtained. Cerebral volume appears stable. No ventriculomegaly. No intracranial mass effect is evident. Today there is abnormal increased trace diffusion signal in both hippocampal formations (series 3, image 54). ADC imaging in the same areas is isointense to mildly facilitated. No other diffusion abnormality identified. IMPRESSION: 1. Abnormal diffusion signal in both hippocampal formations. This pattern argues against vascular or ischemic etiology. Top differential considerations include status epilepticus and herpes encephalitis. 2. The examination had to be discontinued prior to completion due to patient agitation. Only axial diffusion-weighted imaging could be obtained. Electronically Signed: By: Genevie Ann M.D. On: 07/13/2015 16:00    Assessment and plan:   Sandra Charles is an 60 y.o. female patient transferred from [facility] for further neurodiagnostic workup with the EEG due to altered mental status symptoms. She was placed on long-term EEG monitoring overnight,  which showed bilateral, left greater than right parietotemporal slowing and abnormal Discharges, No Electrographic Seizures Noted. Clinically Patient Was Not Noted to Have Any Episodes of Worsening Mental Status Changes or Convulsions. Because of abnormal EEG, started on a loading dose of IV Depacon 1 g this morning with a maintenance dose of 500 mg every 8 hours ordered. She was previously on Vimpat 50 mg twice a day started by Dr. Ebony Hail [facility].    After the loading dose of IV Depacon, patient was noted to be more active, almost at her baseline mental status, able to ambulate without difficulty in the hallway. Her son was at bedside reported significant improvement of her overall mental status and level of functioning today. His very happy with the progress.   Continue Vimpat 50 mg twice a day and Depakote 500 mg every 8 hours. Physical, and occupational therapy follow-up. If patient remains stable with no further mental status changes and able to tolerate medications without side effects, then she can be just plan to be discharged home if she is able to ambulate safely per physical therapy evaluation. She would need neurology outpatient follow-up after discharge for follow-up EEGs and to monitor for seizures.  We'll follow-up.

## 2015-07-16 NOTE — Progress Notes (Signed)
Patient is very disoriented. Each time I walk into her room, she asks me "why am I here?, How did I get here?" and related questions. She does not remember from 15 minutes prior to conversation. She currently has an eeg with leads attached to her head, is on telemetry, and remains safe with the bed alarm on. Will monitor closely. Delcie Roch, RN

## 2015-07-17 ENCOUNTER — Inpatient Hospital Stay (HOSPITAL_COMMUNITY): Payer: Medicaid Other

## 2015-07-17 DIAGNOSIS — R569 Unspecified convulsions: Secondary | ICD-10-CM | POA: Insufficient documentation

## 2015-07-17 DIAGNOSIS — R9089 Other abnormal findings on diagnostic imaging of central nervous system: Secondary | ICD-10-CM | POA: Insufficient documentation

## 2015-07-17 LAB — CBC
HCT: 38.8 % (ref 36.0–46.0)
HEMOGLOBIN: 12.7 g/dL (ref 12.0–15.0)
MCH: 27.5 pg (ref 26.0–34.0)
MCHC: 32.7 g/dL (ref 30.0–36.0)
MCV: 84.2 fL (ref 78.0–100.0)
Platelets: 180 10*3/uL (ref 150–400)
RBC: 4.61 MIL/uL (ref 3.87–5.11)
RDW: 13.7 % (ref 11.5–15.5)
WBC: 10 10*3/uL (ref 4.0–10.5)

## 2015-07-17 LAB — CULTURE, BLOOD (ROUTINE X 2)
CULTURE: NO GROWTH
Culture: NO GROWTH

## 2015-07-17 LAB — AMMONIA: Ammonia: 37 umol/L — ABNORMAL HIGH (ref 9–35)

## 2015-07-17 LAB — BASIC METABOLIC PANEL
ANION GAP: 15 (ref 5–15)
BUN: 19 mg/dL (ref 6–20)
CHLORIDE: 99 mmol/L — AB (ref 101–111)
CO2: 23 mmol/L (ref 22–32)
Calcium: 9.4 mg/dL (ref 8.9–10.3)
Creatinine, Ser: 1.49 mg/dL — ABNORMAL HIGH (ref 0.44–1.00)
GFR calc Af Amer: 44 mL/min — ABNORMAL LOW (ref >60)
GFR calc non Af Amer: 38 mL/min — ABNORMAL LOW (ref >60)
Glucose, Bld: 91 mg/dL (ref 65–99)
POTASSIUM: 3.8 mmol/L (ref 3.5–5.1)
SODIUM: 137 mmol/L (ref 135–145)

## 2015-07-17 LAB — CSF CULTURE: SPECIAL REQUESTS: NORMAL

## 2015-07-17 LAB — HEPATIC FUNCTION PANEL
ALT: 19 U/L (ref 14–54)
AST: 18 U/L (ref 15–41)
Albumin: 3.1 g/dL — ABNORMAL LOW (ref 3.5–5.0)
Alkaline Phosphatase: 55 U/L (ref 38–126)
BILIRUBIN DIRECT: 0.2 mg/dL (ref 0.1–0.5)
BILIRUBIN INDIRECT: 0.3 mg/dL (ref 0.3–0.9)
Total Bilirubin: 0.5 mg/dL (ref 0.3–1.2)
Total Protein: 6.2 g/dL — ABNORMAL LOW (ref 6.5–8.1)

## 2015-07-17 LAB — ENTEROVIRUS PCR: ENTEROVIRUS PCR: NEGATIVE

## 2015-07-17 LAB — CSF CULTURE W GRAM STAIN: Culture: NO GROWTH

## 2015-07-17 LAB — VALPROIC ACID LEVEL: Valproic Acid Lvl: 94 ug/mL (ref 50.0–100.0)

## 2015-07-17 MED ORDER — LORAZEPAM 2 MG/ML IJ SOLN
1.0000 mg | Freq: Once | INTRAMUSCULAR | Status: AC
  Administered 2015-07-17: 1 mg via INTRAVENOUS
  Filled 2015-07-17: qty 1

## 2015-07-17 MED ORDER — GADOBENATE DIMEGLUMINE 529 MG/ML IV SOLN
15.0000 mL | Freq: Once | INTRAVENOUS | Status: AC | PRN
  Administered 2015-07-17: 14 mL via INTRAVENOUS

## 2015-07-17 NOTE — Progress Notes (Signed)
PROGRESS NOTE    Sandra Charles  [id number] DOB: 07/11/1955 DOA: 07/15/2015 PCP: Deborha Payment, MD   Brief Narrative:  On 07/15/2015 by Dr. Phillips Climes Sandra Charles is a 60 y.o. female, With past medical history of hypertension, admitted to [facility] on 5/16 for altered mental status, fever, initially thought to be often mental status secondary to hypertensive urgency giving elevated blood pressure, but in-hospital course was complicated by fever, MRI of the brain showing finding at hippocampus which could represent herpes encephalitis versus status epilepticus, he had a repeat done at [facility] with no bacteria on smear, but he'll be was significant for lymphocytes could indicate viral process, patient was seen by ID physician at the fifth child at [facility], initially a triple antibiotic including cycling, vancomycin and Rocephin, IV acyclovir was added as well secondary to concern of HSV encephalitis, issues with significant improvement of mental status, but neurology thought seizure needed to be worked up further, so transfer requested to [facility] for continuous EEG.  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 -LP: 10WBC lymphocytes, normal protein and glucose -HSV negative, CSF culture shows no growth -Blood cultures from 5/16 show no growth to date -On 5/17 MRI Brain showed abnormal diffusion signal in both hippocampal formations, Suspicious for status epilepticus -Neurology consulted and appreciated, had continuous video EEG monitoring -Patient is still confused and does not know why she is here [facility] -Continue Vimpat 50mg  BID, Depakote 500mg  TID -Acyclovir discontinued -Vimpat  was discontinued  -EEG 07/16/15: EEG is consistent with Focal neuronal dysfunction in the bilateral temporal regions, left more than right. There is also suggestion  of cortical irritability in the left mid-temporal region. No electrographic seizures were seen -MRI on 5/21: Symmetric hippocampal edema most commonly seen in status epilepticus -Spoke with neurology, valproic acid level pending, will continue EEG monitoring given patient's acute memory loss/confusion  Essential Hypertension -Continue Coreg, amlodipine, clonidine   GERD -Continue PPI  Hypokalemia  -Resolved, continue to monitor BMP   Vitamin B 12 deficiency -Continue supplement  History of hyperlipidemia -Pravastatin has been stopped as it may be contributing to altered mental status  DVT Prophylaxis  Heparin  Code Status: Full  Family Communication: None at beside  Disposition Plan: Admitted, continue to monitor   Consultants Neurology  Procedures  EEG Continuous EEG  Antibiotics   Anti-infectives    Start     Dose/Rate Route Frequency Ordered Stop   07/15/15 1600  acyclovir (ZOVIRAX) 705 mg in dextrose 5 % 150 mL IVPB  Status:  Discontinued     10 mg/kg  70.6 kg 164.1 mL/hr over 60 Minutes Intravenous Every 8 hours 07/15/15 1504 07/17/15 0916   07/15/15 1515  acyclovir (ZOVIRAX) 545 mg in dextrose 5 % 100 mL IVPB  Status:  Discontinued     10 mg/kg  54.7 kg (Ideal) 110.9 mL/hr over 60 Minutes Intravenous Every 8 hours 07/15/15 1504 07/15/15 1505      Subjective:   Sandra Charles seen and examined today.  Patient has no complaints this morning.  Still has confusion, does not know why she is the hospital.  Cannot remember events over the past 48 hours. Currently denies chest pain or shortness of breath, abdominal pain, nausea or vomiting, diarrhea, constipation, dizziness, headache, cough.   Objective:   Filed Vitals:   07/16/15 1831 07/16/15 2107 07/17/15 0536 07/17/15 0716  BP: 147/61 136/51 174/70  154/65  Pulse: 89 79 84 88  Temp:  99.4 F (37.4 C) 98.4 F (36.9 C)   TempSrc:  Oral Oral   Resp:  16 20   Height:      Weight:      SpO2:  100% 97%      Intake/Output Summary (Last 24 hours) at 07/17/15 1147 Last data filed at 07/17/15 0612  Gross per 24 hour  Intake 1272.3 ml  Output   1050 ml  Net  222.3 ml   Filed Weights   07/15/15 1425  Weight: 68.7 kg (151 lb 7.3 oz)    Exam  General: Well developed, well nourished, NAD  HEENT: NCAT,mucous membranes moist.   Cardiovascular: S1 S2 auscultated, no murmurs, RRR  Respiratory: Clear to auscultation bilaterally  Abdomen: Soft, nontender, nondistended, + bowel sounds  Extremities: warm dry without cyanosis clubbing or edema  Neuro: AAOx2 (self and place, not time), nonfocal  Data Reviewed: I have personally reviewed following labs and imaging studies  CBC:  Recent Labs Lab 07/13/15 0514 07/13/15 1924 07/14/15 0516 07/15/15 1612 07/16/15 0655 07/17/15 0617  WBC 13.5*  --  13.0* 12.4* 11.4* 10.0  NEUTROABS  --  10.7*  --   --   --   --   HGB 14.3  --  14.1 14.1 14.0 12.7  HCT 41.9  --  41.0 42.0 42.1 38.8  MCV 83.3  --  83.3 83.8 84.2 84.2  PLT 196  --  189 202 201 [id number]   Basic Metabolic Panel:  Recent Labs Lab 07/13/15 0514 07/13/15 1924 07/14/15 0516 07/15/15 0447 07/15/15 1612 07/16/15 0655 07/17/15 0617  NA 131*  --  133* 138  --  138 137  K 2.8* 2.7* 3.5 3.6  --  3.8 3.8  CL 96*  --  97* 104  --  102 99*  CO2 25  --  27 25  --  23 23  GLUCOSE 131*  --  117* 108*  --  100* 91  BUN 9  --  18 17  --  14 19  CREATININE 0.61  --  0.78 1.00 1.31* 1.22* 1.49*  CALCIUM 9.7  --  9.5 9.7  --  9.7 9.4  MG 2.1  --   --  2.1  --   --   --    GFR: Estimated Creatinine Clearance: 39.7 mL/min (by C-G formula based on Cr of 1.49). Liver Function Tests:  Recent Labs Lab 07/12/15 1335  AST 32  ALT 34  ALKPHOS 106  BILITOT 0.6  PROT 9.5*  ALBUMIN 5.1*   No results for input(s): LIPASE, AMYLASE in the last 168 hours. No results for input(s): AMMONIA in the last 168 hours. Coagulation Profile:  Recent Labs Lab 07/14/15 1223  INR 1.12    Cardiac Enzymes:  Recent Labs Lab 07/12/15 1901  CKTOTAL 137   BNP (last 3 results) No results for input(s): PROBNP in the last 8760 hours. HbA1C: No results for input(s): HGBA1C in the last 72 hours. CBG:  Recent Labs Lab 07/12/15 1433  GLUCAP 190*   Lipid Profile: No results for input(s): CHOL, HDL, LDLCALC, TRIG, CHOLHDL, LDLDIRECT in the last 72 hours. Thyroid Function Tests: No results for input(s): TSH, T4TOTAL, FREET4, T3FREE, THYROIDAB in the last 72 hours. Anemia Panel: No results for input(s): VITAMINB12, FOLATE, FERRITIN, TIBC, IRON, RETICCTPCT in the last 72 hours. Urine analysis:    Component Value Date/Time   COLORURINE STRAW* 07/12/2015 1518   COLORURINE Yellow 06/10/2014  0021   APPEARANCEUR CLEAR* 07/12/2015 1518   APPEARANCEUR Hazy 06/10/2014 0021   LABSPEC 1.011 07/12/2015 1518   LABSPEC 1.005 06/10/2014 0021   PHURINE 8.0 07/12/2015 1518   PHURINE 5.0 06/10/2014 0021   GLUCOSEU >500* 07/12/2015 1518   GLUCOSEU Negative 06/10/2014 0021   HGBUR 1+* 07/12/2015 1518   HGBUR 1+ 06/10/2014 0021   BILIRUBINUR NEGATIVE 07/12/2015 1518   BILIRUBINUR Negative 06/10/2014 0021   KETONESUR TRACE* 07/12/2015 1518   KETONESUR Negative 06/10/2014 0021   PROTEINUR >500* 07/12/2015 1518   PROTEINUR Negative 06/10/2014 0021   NITRITE NEGATIVE 07/12/2015 1518   NITRITE Negative 06/10/2014 0021   LEUKOCYTESUR NEGATIVE 07/12/2015 1518   LEUKOCYTESUR 2+ 06/10/2014 0021   Sepsis Labs: @LABRCNTIP (procalcitonin:4,lacticidven:4)  ) Recent Results (from the past 240 hour(s))  CULTURE, BLOOD (ROUTINE X 2) w Reflex to ID Panel     Status: None (Preliminary result)   Collection Time: 07/12/15  5:00 PM  Result Value Ref Range Status   Specimen Description BLOOD RIGHT HAND  Final   Special Requests BOTTLES DRAWN AEROBIC AND ANAEROBIC  10CC  Final   Culture NO GROWTH 4 DAYS  Final   Report Status PENDING  Incomplete  CULTURE, BLOOD (ROUTINE X 2) w Reflex to ID Panel      Status: None (Preliminary result)   Collection Time: 07/12/15  5:49 PM  Result Value Ref Range Status   Specimen Description BLOOD LEFT ARM  Final   Special Requests BOTTLES DRAWN AEROBIC AND ANAEROBIC  10CC  Final   Culture NO GROWTH 4 DAYS  Final   Report Status PENDING  Incomplete  CSF culture     Status: None   Collection Time: 07/14/15  3:37 PM  Result Value Ref Range Status   Specimen Description CSF  Final   Special Requests Normal  Final   Gram Stain   Final    WBC PRESENT,BOTH PMN AND MONONUCLEAR NO ORGANISMS SEEN CYTOSPIN SMEAR Results Called to: AMY DALTON AT [id number] 07/14/15 MLZ    Culture NO GROWTH 3 DAYS  Final   Report Status 2015-08-14 FINAL  Final      Radiology Studies: Mr Jeri Cos [id number] Contrast  2015/08/14  CLINICAL DATA:  Altered mental status, follow-up hippocampal abnormality. Cerebral spinal fluid negative for HSV. History of hypertension, hyperlipidemia and mini stroke. EXAM: MRI HEAD WITHOUT AND WITH CONTRAST TECHNIQUE: Multiplanar, multiecho pulse sequences of the brain and surrounding structures were obtained without and with intravenous contrast. CONTRAST:  17mL MULTIHANCE GADOBENATE DIMEGLUMINE 529 MG/ML IV SOLN COMPARISON:  Limited MRI of the brain Jul 13, 2015 an MRI of the head May 14, 2015 FINDINGS: INTRACRANIAL CONTENTS: Mild reduced diffusion within the bilateral hippocampus. T2 bright edema and faint enhancement within the bilateral hippocampi obscuring the morphology. No susceptibility artifact to suggest hemorrhage. The ventricles and sulci are normal for patient's age. No suspicious parenchymal signal, mass lesions, mass effect. Scattered subcentimeter supratentorial white matter FLAIR T2 hyperintensities. 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 No abnormal intraparenchymal or extra-axial enhancement. No abnormal extra-axial fluid collections. No extra-axial masses. Normal major intracranial vascular flow voids present at skull base. ORBITS: The included  ocular globes and orbital contents are non-suspicious. SINUSES: The mastoid air-cells and included paranasal sinuses are well-aerated. SKULL/SOFT TISSUES: No abnormal sellar expansion. No suspicious calvarial bone marrow signal. Craniocervical junction maintained. 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 Mild white matter changes most compatible  with chronic small vessel ischemic disease. 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 Electronically Signed   By: Elon Alas M.D.   On: 07/17/2015 04:09     Scheduled Meds: . amLODipine  10 mg Oral Daily  . aspirin EC  81 mg Oral Daily  . carvedilol  25 mg Oral BID WC  . cholecalciferol  5,000 Units Oral Daily  . citalopram  40 mg Oral Daily  . cloNIDine  0.1 mg Oral BID  . cyanocobalamin  1,000 mcg Intramuscular Daily  . divalproex  500 mg Oral Q8H  . heparin  5,000 Units Subcutaneous Q8H  . pantoprazole  40 mg Oral Daily  . potassium chloride  20 mEq Oral Daily  . sodium chloride flush  3 mL Intravenous Q12H   Continuous Infusions:    LOS: 2 days   Time Spent in minutes   30 minutes  Maley Venezia D.O. on 07/17/2015 at 11:47 AM  Between 7am to 7pm - Pager - [phone]  After 7pm go to [email or web address] - password [email or web address]  And look for the night coverage person covering for me after hours  [facility] Office  [phone]

## 2015-07-17 NOTE — Progress Notes (Signed)
Interval History:                                                                                                                      Sandra Charles is an 60 y.o. female patient transferred from [facility] for further neurodiagnostic workup with the EEG due to altered mental status symptoms. She was placed on long-term EEG monitoring previously, which showed bilateral, left greater than right parietotemporal slowing and abnormal Discharges, No Electrographic Seizures Noted. Clinically Patient Was Not Noted to Have Any Episodes of Worsening Mental Status Changes or Convulsions. Because of abnormal EEG, she was initially given a loading dose of IV Depacon 1 g followed by  maintenance dose of 500 mg every 8 hours ordered. She was started on Vimpat 50 mg twice a day at [facility].    Patient denies any new neurological symptoms. During my interaction,  patient and family members reported her being more active with time, although there has been some concern by the primary hospitalist physician that patient was unable to recall events from yesterday and continued to have intermittent confusion.    Past Medical History: Past Medical History  Diagnosis Date  . Hypertension   . Anxiety   . Depression   . Mini stroke ([place])     "several since 05/2014" (07/15/2015)  . Hyperlipidemia   . GERD (gastroesophageal reflux disease)   . Headache     "weekly" (07/15/2015)  . Seizures ([place]) dx'd 04/2015  . Arthritis     "all over"  . Chronic lower back pain     Past Surgical History  Procedure Laterality Date  . Vaginal hysterectomy    . Tubal ligation    . Dilation and curettage of uterus    . Lumbar puncture  07/14/2015    Family History: Family History  Problem Relation Age of Onset  . Hyperlipidemia Mother   . Breast cancer Mother 53  . Hypertension Mother   . Heart attack Mother     age 40's  . Breast cancer Sister 39  . Hyperlipidemia Father   . Hypertension Father   .  Heart attack Father 72    Social History:   reports that she has quit smoking. Her smoking use included Cigarettes. She has a 5 pack-year smoking history. She has never used smokeless tobacco. She reports that she drinks alcohol. She reports that she does not use illicit drugs.  Allergies:  No Known Allergies   Medications:  Current facility-administered medications:  .  ALPRAZolam (XANAX) tablet 1 mg, 1 mg, Oral, TID PRN, Silver Huguenin Elgergawy, MD .  amLODipine (NORVASC) tablet 10 mg, 10 mg, Oral, Daily, Albertine Patricia, MD, 10 mg at 07/17/15 1331 .  aspirin EC tablet 81 mg, 81 mg, Oral, Daily, Albertine Patricia, MD, 81 mg at 07/17/15 1332 .  carvedilol (COREG) tablet 25 mg, 25 mg, Oral, BID WC, Albertine Patricia, MD, 25 mg at 07/17/15 1732 .  cholecalciferol (VITAMIN D) tablet 5,000 Units, 5,000 Units, Oral, Daily, Albertine Patricia, MD, 5,000 Units at 07/17/15 1330 .  citalopram (CELEXA) tablet 40 mg, 40 mg, Oral, Daily, Albertine Patricia, MD, 40 mg at 07/17/15 1330 .  cloNIDine (CATAPRES) tablet 0.1 mg, 0.1 mg, Oral, BID, Albertine Patricia, MD, 0.1 mg at 07/17/15 1333 .  divalproex (DEPAKOTE) DR tablet 500 mg, 500 mg, Oral, Q8H, Kaveon Blatz Fuller Mandril, MD, 500 mg at 07/17/15 1332 .  heparin injection 5,000 Units, 5,000 Units, Subcutaneous, Q8H, Albertine Patricia, MD, 5,000 Units at 07/17/15 1324 .  hydrALAZINE (APRESOLINE) injection 10 mg, 10 mg, Intravenous, Q6H PRN, Dianne Dun, NP, 10 mg at 07/17/15 0550 .  HYDROcodone-acetaminophen (NORCO/VICODIN) 5-325 MG per tablet 1 tablet, 1 tablet, Oral, Q6H PRN, Silver Huguenin Elgergawy, MD .  pantoprazole (PROTONIX) EC tablet 40 mg, 40 mg, Oral, Daily, Albertine Patricia, MD, 40 mg at 07/17/15 1333 .  potassium chloride SA (K-DUR,KLOR-CON) CR tablet 20 mEq, 20 mEq, Oral, Daily, Albertine Patricia, MD, 20 mEq  at 07/17/15 1332 .  sodium chloride flush (NS) 0.9 % injection 3 mL, 3 mL, Intravenous, Q12H, Albertine Patricia, MD, 3 mL at 07/16/15 2108   Neurologic Examination:                                                                                                     Today's Vitals   07/17/15 0716 07/17/15 0834 07/17/15 1329 07/17/15 1730  BP: 154/65  127/51 132/66  Pulse: 88  80 72  Temp:   99.1 F (37.3 C)   TempSrc:   Oral   Resp:   22   Height:      Weight:      SpO2:   100%   PainSc:  0-No pain      Evaluation of higher integrative functions including: Level of alertness: Alert, Able to do simple calculations with single digits, recall of 0 out of 3 at 2 minutes.  Oriented to time, place and person Speech: fluent, no evidence of dysarthria or aphasia noted.  Test the following cranial nerves: 2-12 grossly intact Motor examination: full 5/5 motor strength in all 4 extremities Test coordination: Normal finger nose testing, with no evidence of limb appendicular ataxia or abnormal involuntary movements or tremors noted.    Lab Results: Basic Metabolic Panel:  Recent Labs Lab 07/13/15 0514 07/13/15 1924 07/14/15 0516 07/15/15 0447 07/15/15 1612 07/16/15 0655 07/17/15 0617  NA 131*  --  133* 138  --  138 137  K 2.8* 2.7* 3.5 3.6  --  3.8  3.8  CL 96*  --  97* 104  --  102 99*  CO2 25  --  27 25  --  23 23  GLUCOSE 131*  --  117* 108*  --  100* 91  BUN 9  --  18 17  --  14 19  CREATININE 0.61  --  0.78 1.00 1.31* 1.22* 1.49*  CALCIUM 9.7  --  9.5 9.7  --  9.7 9.4  MG 2.1  --   --  2.1  --   --   --     Liver Function Tests:  Recent Labs Lab 07/12/15 1335 07/17/15 1421  AST 32 18  ALT 34 19  ALKPHOS 106 55  BILITOT 0.6 0.5  PROT 9.5* 6.2*  ALBUMIN 5.1* 3.1*   No results for input(s): LIPASE, AMYLASE in the last 168 hours.  Recent Labs Lab 07/17/15 1408  AMMONIA 37*    CBC:  Recent Labs Lab 07/13/15 0514 07/13/15 1924 07/14/15 0516  07/15/15 1612 07/16/15 0655 07/17/15 0617  WBC 13.5*  --  13.0* 12.4* 11.4* 10.0  NEUTROABS  --  10.7*  --   --   --   --   HGB 14.3  --  14.1 14.1 14.0 12.7  HCT 41.9  --  41.0 42.0 42.1 38.8  MCV 83.3  --  83.3 83.8 84.2 84.2  PLT 196  --  189 202 201 180    Cardiac Enzymes:  Recent Labs Lab 07/12/15 1901  CKTOTAL 137    Lipid Panel: No results for input(s): CHOL, TRIG, HDL, CHOLHDL, VLDL, LDLCALC in the last 168 hours.  CBG:  Recent Labs Lab 07/12/15 1433  GLUCAP 190*    Microbiology: Results for orders placed or performed during the hospital encounter of 07/12/15  CULTURE, BLOOD (ROUTINE X 2) w Reflex to ID Panel     Status: None   Collection Time: 07/12/15  5:00 PM  Result Value Ref Range Status   Specimen Description BLOOD RIGHT HAND  Final   Special Requests BOTTLES DRAWN AEROBIC AND ANAEROBIC  10CC  Final   Culture NO GROWTH 5 DAYS  Final   Report Status 07/17/2015 FINAL  Final  CULTURE, BLOOD (ROUTINE X 2) w Reflex to ID Panel     Status: None   Collection Time: 07/12/15  5:49 PM  Result Value Ref Range Status   Specimen Description BLOOD LEFT ARM  Final   Special Requests BOTTLES DRAWN AEROBIC AND ANAEROBIC  10CC  Final   Culture NO GROWTH 5 DAYS  Final   Report Status 07/17/2015 FINAL  Final  CSF culture     Status: None   Collection Time: 07/14/15  3:37 PM  Result Value Ref Range Status   Specimen Description CSF  Final   Special Requests Normal  Final   Gram Stain   Final    WBC PRESENT,BOTH PMN AND MONONUCLEAR NO ORGANISMS SEEN CYTOSPIN SMEAR Results Called to: AMY DALTON AT [id number] 07/14/15 MLZ    Culture NO GROWTH 3 DAYS  Final   Report Status 07/17/2015 FINAL  Final    Imaging: Mr Jeri Cos [id number] Contrast  07/17/2015  CLINICAL DATA:  Altered mental status, follow-up hippocampal abnormality. Cerebral spinal fluid negative for HSV. History of hypertension, hyperlipidemia and mini stroke. EXAM: MRI HEAD WITHOUT AND WITH CONTRAST TECHNIQUE:  Multiplanar, multiecho pulse sequences of the brain and surrounding structures were obtained without and with intravenous contrast. CONTRAST:  68mL MULTIHANCE GADOBENATE DIMEGLUMINE 529 MG/ML IV SOLN  COMPARISON:  Limited MRI of the brain Jul 13, 2015 an MRI of the head May 14, 2015 FINDINGS: INTRACRANIAL CONTENTS: Mild reduced diffusion within the bilateral hippocampus. T2 bright edema and faint enhancement within the bilateral hippocampi obscuring the morphology. No susceptibility artifact to suggest hemorrhage. The ventricles and sulci are normal for patient's age. No suspicious parenchymal signal, mass lesions, mass effect. Scattered subcentimeter supratentorial white matter FLAIR T2 hyperintensities. 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 No abnormal intraparenchymal or extra-axial enhancement. No abnormal extra-axial fluid collections. No extra-axial masses. Normal major intracranial vascular flow voids present at skull base. ORBITS: The included ocular globes and orbital contents are non-suspicious. SINUSES: The mastoid air-cells and included paranasal sinuses are well-aerated. SKULL/SOFT TISSUES: No abnormal sellar expansion. No suspicious calvarial bone marrow signal. Craniocervical junction maintained. 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 Mild white matter changes most compatible with chronic small vessel ischemic disease. 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 Electronically Signed   By: Elon Alas M.D.   On: 07/17/2015 04:09    Assessment and plan:   ary JANDI GONET is an 60 y.o. female patient transferred from [facility] for further neurodiagnostic workup with the EEG due to altered mental status symptoms. She was placed on long-term EEG monitoring previously, which showed bilateral, left greater than right parietotemporal slowing and  abnormal Discharges, No Electrographic Seizures Noted. Clinically Patient Was Not Noted to Have Any Episodes of Worsening Mental Status Changes or Convulsions. Because of abnormal EEG, she was initially given a loading dose of IV Depacon 1 g followed by  maintenance dose of 500 mg every 8 hours ordered. She was started on Vimpat 50 mg twice a day at [facility].   Patient denies any new neurological symptoms.During my interaction,  patient and family members reported her being more active with time, although there has been some concern by the primary hospitalist physician that patient was unable to recall events from yesterday and continued to have intermittent confusion.  It is possible that she could be having some intermittent electrographic ictal events causing the memory lapses or confusion. Recommend restarting the long-term EEG for overnight monitoring due to the ongoing confusion episodes described by the primary team. We'll continue Depakote same dose of 500 mg every 8 hours, ordered trough level to be checked later today. Adjust Depakote dose based on the trough level. 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 These changes were seen on the limited brain MRI done at [facility].  HSV PCR and CSF is negative, discontinued acyclovir. CSF cultures are also remain negative. Ordered VGKC and  NMDA antibodies to evaluate for limbic encephalitis.  Patient is on Medicaid, and it could be difficult to obtain a brand name medication such as Vimpat as an outpatient. Hence I would recommend discontinuing Vimpat at this time and will try to optimize with Depakote monotherapy based on her blood levels, and with monitoring of the EEG while in the hospital.  Dr. Leonel Ramsay will continue to follow starting Monday morning.

## 2015-07-17 NOTE — Evaluation (Signed)
Occupational Therapy Evaluation Patient Details Name: Sandra Charles MRN: [id number] DOB: 09/23/1955 Today's Date: 07/17/2015    History of Present Illness Sandra Charles is an 60 y.o. female admitted to [facility] with confusion. Per chart: fiancee reports that she was delusional and Sister reports that she could not see anything as well.Patient remained confused while at [facility] and EEG was obtained showing no evidence of seizure. LP was obtained. Due to need for LTM she was transferred to [facility] hospital 5/19. PMHx- admitted in March 2017 with confusion ? Seizure work up was negative at that time; HTN; TIA;    Clinical Impression   Pt. Has decreased cognition and memory from baseline. Pt. Has decreased I with ADLs and mobility and would benefit from further OT to maximize I and safety with self care tasks to d/c home.  Pt. Is motivated to work with  Therapy to achieve baseline performance.     Follow Up Recommendations  Home health OT    Equipment Recommendations  Tub/shower seat    Recommendations for Other Services       Precautions / Restrictions Precautions Precautions: Fall Restrictions Weight Bearing Restrictions: No      Mobility Bed Mobility Overal bed mobility: Modified Independent                Transfers     Transfers: Sit to/from Stand Sit to Stand: Min guard              Balance                                            ADL Overall ADL's : Needs assistance/impaired Eating/Feeding: Independent   Grooming: Wash/dry hands;Wash/dry face;Min guard;Standing   Upper Body Bathing: Supervision/ safety;Set up;Sitting   Lower Body Bathing: Minimal assistance;Sit to/from stand   Upper Body Dressing : Supervision/safety;Set up;Sitting   Lower Body Dressing: Minimal assistance;Sit to/from stand   Toilet Transfer: Minimal assistance;Ambulation;Comfort height toilet   Toileting- Clothing Manipulation and Hygiene: Minimal  assistance;Sit to/from stand       Functional mobility during ADLs: Minimal assistance General ADL Comments: Pt. is S/setup with UE ADLs and is Min A with LE ADLs.      Vision     Perception     Praxis      Pertinent Vitals/Pain Pain Assessment: No/denies pain     Hand Dominance Left   Extremity/Trunk Assessment Upper Extremity Assessment Upper Extremity Assessment: Overall WFL for tasks assessed           Communication Communication Communication: No difficulties   Cognition Arousal/Alertness: Awake/alert Behavior During Therapy: WFL for tasks assessed/performed Overall Cognitive Status: Impaired/Different from baseline Area of Impairment: Orientation Orientation Level: Place;Time;Situation   Memory: Decreased short-term memory Following Commands: Follows one step commands consistently           General Comments       Exercises       Shoulder Instructions      Home Living Family/patient expects to be discharged to:: Private residence Living Arrangements: Spouse/significant other (boyfriend) Available Help at Discharge: Family;Available 24 hours/day Type of Home: Mobile home Home Access: Stairs to enter Entrance Stairs-Number of Steps: 4 in front with 1 railingl 2 in rear with no railing   Home Layout: One level     Bathroom Shower/Tub: Occupational psychologist: Handicapped height  Home Equipment: None          Prior Functioning/Environment Level of Independence: Independent        Comments: Pt. states she was driving and shopping. Pt. was I with  IADLs    OT Diagnosis: Generalized weakness;Cognitive deficits   OT Problem List: Decreased strength;Decreased activity tolerance;Decreased cognition;Decreased safety awareness;Decreased knowledge of use of DME or AE   OT Treatment/Interventions: Self-care/ADL training;Neuromuscular education;Therapeutic activities;Patient/family education    OT Goals(Current goals can be  found in the care plan section) Acute Rehab OT Goals Patient Stated Goal: go home OT Goal Formulation: With patient Time For Goal Achievement: 07/31/15 Potential to Achieve Goals: Good ADL Goals Pt Will Perform Grooming: with modified independence;standing Pt Will Perform Upper Body Bathing: with modified independence;sitting Pt Will Perform Lower Body Bathing: with modified independence;sit to/from stand Pt Will Perform Upper Body Dressing: with modified independence;sitting Pt Will Perform Lower Body Dressing: with modified independence;sit to/from stand Pt Will Transfer to Toilet: with modified independence;ambulating;regular height toilet Pt Will Perform Toileting - Clothing Manipulation and hygiene: with modified independence;sit to/from stand Pt Will Perform Tub/Shower Transfer: Shower transfer;with modified independence;ambulating  OT Frequency: Min 2X/week   Barriers to D/C:            Co-evaluation              End of Session    Activity Tolerance: Patient tolerated treatment well Patient left: in chair;with call bell/phone within reach;with chair alarm set   Time: 1000-1053 OT Time Calculation (min): 53 min Charges:  OT General Charges $OT Visit: 1 Procedure OT Evaluation $OT Eval Moderate Complexity: 1 Procedure OT Treatments $Self Care/Home Management : 23-37 mins G-Codes:    Margart Zemanek 2015-07-27, 10:52 AM

## 2015-07-17 NOTE — Progress Notes (Signed)
EEG Completed; Results Pending  

## 2015-07-18 ENCOUNTER — Inpatient Hospital Stay (HOSPITAL_COMMUNITY): Payer: Medicaid Other

## 2015-07-18 ENCOUNTER — Encounter (HOSPITAL_COMMUNITY): Payer: Self-pay

## 2015-07-18 ENCOUNTER — Telehealth: Payer: Self-pay | Admitting: Infectious Diseases

## 2015-07-18 DIAGNOSIS — R930 Abnormal findings on diagnostic imaging of skull and head, not elsewhere classified: Secondary | ICD-10-CM

## 2015-07-18 DIAGNOSIS — R4182 Altered mental status, unspecified: Secondary | ICD-10-CM

## 2015-07-18 LAB — CBC
HEMATOCRIT: 36.8 % (ref 36.0–46.0)
HEMOGLOBIN: 11.8 g/dL — AB (ref 12.0–15.0)
MCH: 28 pg (ref 26.0–34.0)
MCHC: 32.1 g/dL (ref 30.0–36.0)
MCV: 87.2 fL (ref 78.0–100.0)
Platelets: 183 10*3/uL (ref 150–400)
RBC: 4.22 MIL/uL (ref 3.87–5.11)
RDW: 14.3 % (ref 11.5–15.5)
WBC: 8.3 10*3/uL (ref 4.0–10.5)

## 2015-07-18 LAB — BASIC METABOLIC PANEL
ANION GAP: 9 (ref 5–15)
BUN: 15 mg/dL (ref 6–20)
CHLORIDE: 106 mmol/L (ref 101–111)
CO2: 25 mmol/L (ref 22–32)
CREATININE: 1.29 mg/dL — AB (ref 0.44–1.00)
Calcium: 9.2 mg/dL (ref 8.9–10.3)
GFR calc non Af Amer: 45 mL/min — ABNORMAL LOW (ref >60)
GFR, EST AFRICAN AMERICAN: 52 mL/min — AB (ref >60)
Glucose, Bld: 89 mg/dL (ref 65–99)
POTASSIUM: 3.7 mmol/L (ref 3.5–5.1)
Sodium: 140 mmol/L (ref 135–145)

## 2015-07-18 MED ORDER — IOPAMIDOL (ISOVUE-300) INJECTION 61%
INTRAVENOUS | Status: AC
  Administered 2015-07-18: 100 mL
  Filled 2015-07-18: qty 100

## 2015-07-18 MED ORDER — DIATRIZOATE MEGLUMINE & SODIUM 66-10 % PO SOLN
15.0000 mL | ORAL | Status: AC
  Administered 2015-07-18 (×2): 15 mL via ORAL
  Filled 2015-07-18: qty 30

## 2015-07-18 MED ORDER — SODIUM CHLORIDE 0.9 % IV SOLN
INTRAVENOUS | Status: DC
  Administered 2015-07-18: 75 mL via INTRAVENOUS
  Administered 2015-07-19: 07:00:00 via INTRAVENOUS

## 2015-07-18 NOTE — Care Management Note (Signed)
Case Management Note  Patient Details  Name: AVAMAE HAUSKINS MRN: [id number] Date of Birth: Jul 03, 1955  Subjective/Objective:                 Spoke with patient's daughter in the room. Lives at home with her boyfriend who will be able to assist with cares throughout the day. Transferred from [facility] for neuro workup, EEG.    Action/Plan:  Anticipate DC to home in next day.  Expected Discharge Date:                  Expected Discharge Plan:  [place] (does not qualify for [facility] PT w/ medicaid)  In-House Referral:  Clinical Social Work  Discharge planning Services  CM Consult  Post Acute Care Choice:    Choice offered to:     DME Arranged:    DME Agency:     HH Arranged:    HH Agency:     Status of Service:     Medicare Important Message Given:    Date Medicare IM Given:    Medicare IM give by:    Date Additional Medicare IM Given:    Additional Medicare Important Message give by:     If discussed at [place] of Stay Meetings, dates discussed:    Additional Comments:  Carles Collet, RN 07/18/2015, 4:12 PM

## 2015-07-18 NOTE — Telephone Encounter (Signed)
Hi I am ID at [facility] and have been following her case.  Very interesting.  I would suggest a CT chest, abd pelvis to look for malignancy.  Thanks Vanetta Shawl

## 2015-07-18 NOTE — Progress Notes (Signed)
PROGRESS NOTE    Sandra Charles  [id number] DOB: May 27, 1955 DOA: 07/15/2015 PCP: Deborha Payment, MD   Brief Narrative:  On 07/15/2015 by Dr. Phillips Climes Zurii Regester is a 60 y.o. 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, he had a repeat done at [facility] with no bacteria on smear, but he'll be was significant for lymphocytes could indicate viral process, patient was seen by ID physician at the fifth child at [facility], initially a triple antibiotic including cycling, vancomycin and Rocephin, IV acyclovir was added as well secondary to concern of HSV encephalitis, issues with significant improvement of mental status, but neurology thought seizure needed to be worked up further, so transfer requested to [facility] for continuous EEG.  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 -LP: 10WBC lymphocytes, normal protein and glucose -HSV negative, CSF culture shows no growth -Blood cultures from 5/16 show no growth to date -On 5/17 MRI Brain showed abnormal diffusion signal in both hippocampal formations, Suspicious for status epilepticus -Neurology consulted and appreciated, had continuous video EEG monitoring -Patient is still confused and does not know why she is here [name] -Continue Vimpat 50mg  BID, Depakote 500mg  TID -Acyclovir discontinued -Vimpat  was discontinued  -EEG 07/16/15: EEG is consistent with Focal neuronal dysfunction in the bilateral temporal regions, left more than right. There is also suggestion  of cortical irritability in the left mid-temporal region. 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 Leonel Ramsay, felt that patient may have short-term memory loss for quite sometime.  -Continuous EEG currently in progress -Infectious disease at [facility] recommended CT chest/abd/pelvis to rule out malignancy.    Essential Hypertension -Continue Coreg, amlodipine, clonidine   GERD -Continue PPI  Hypokalemia  -Resolved, continue to monitor BMP   Vitamin B 12 deficiency -Continue supplement  History of hyperlipidemia -Pravastatin has been stopped as it may be contributing to altered mental status  DVT Prophylaxis  Heparin  Code Status: Full  Family Communication: None at beside  Disposition Plan: Admitted, continue to monitor   Consultants Neurology  Procedures  EEG Continuous EEG  Antibiotics   Anti-infectives    Start     Dose/Rate Route Frequency Ordered Stop   07/15/15 1600  acyclovir (ZOVIRAX) 705 mg in dextrose 5 % 150 mL IVPB  Status:  Discontinued     10 mg/kg  70.6 kg 164.1 mL/hr over 60 Minutes Intravenous Every 8 hours 07/15/15 1504 07/17/15 0916   07/15/15 1515  acyclovir (ZOVIRAX) 545 mg in dextrose 5 % 100 mL IVPB  Status:  Discontinued     10 mg/kg  54.7 kg (Ideal) 110.9 mL/hr over 60 Minutes Intravenous Every 8 hours 07/15/15 1504 07/15/15 1505      Subjective:   Sandra Charles seen and examined today.  Patient has no complaints. Denies chest pain or shortness of breath, abdominal pain, nausea or vomiting, diarrhea, constipation, dizziness, headache, cough. Cannot remember events over the past few days.  Objective:   Filed Vitals:   07/17/15 1329 07/17/15 1730 07/17/15 2126 07/18/15 [id number]  BP: 127/51 132/66 134/60 159/70  Pulse: 80 72 79 76  Temp: 99.1 F (37.3 C)  99.3 F (37.4 C) 99.2 F (37.3 C)  TempSrc: Oral   Oral Oral  Resp: 22  18 18   Height:      Weight:      SpO2: 100%  100% 100%   No intake or output data in the 24 hours ending 07/18/15 1307 Filed Weights   07/15/15 1425  Weight: 68.7 kg (151 lb 7.3 oz)    Exam  General: Well developed, well nourished, NAD  HEENT: NCAT,mucous membranes moist.   Cardiovascular: S1 S2 auscultated, no murmurs, RRR  Respiratory: Clear to auscultation bilaterally  Abdomen: Soft, nontender, nondistended, + bowel sounds  Extremities: warm dry without cyanosis clubbing or edema  Neuro: AAOx2 (self and place, not time or situation), nonfocal  Data Reviewed: I have personally reviewed following labs and imaging studies  CBC:  Recent Labs Lab 07/13/15 1924 07/14/15 0516 07/15/15 1612 07/16/15 0655 07/17/15 0617 07/18/15 0515  WBC  --  13.0* 12.4* 11.4* 10.0 8.3  NEUTROABS 10.7*  --   --   --   --   --   HGB  --  14.1 14.1 14.0 12.7 11.8*  HCT  --  41.0 42.0 42.1 38.8 36.8  MCV  --  83.3 83.8 84.2 84.2 87.2  PLT  --  189 202 201 180 [id number]   Basic Metabolic Panel:  Recent Labs Lab 07/13/15 0514  07/14/15 0516 07/15/15 0447 07/15/15 1612 07/16/15 0655 07/17/15 0617 07/18/15 0515  NA 131*  --  133* 138  --  138 137 140  K 2.8*  < > 3.5 3.6  --  3.8 3.8 3.7  CL 96*  --  97* 104  --  102 99* 106  CO2 25  --  27 25  --  23 23 25   GLUCOSE 131*  --  117* 108*  --  100* 91 89  BUN 9  --  18 17  --  14 19 15   CREATININE 0.61  --  0.78 1.00 1.31* 1.22* 1.49* 1.29*  CALCIUM 9.7  --  9.5 9.7  --  9.7 9.4 9.2  MG 2.1  --   --  2.1  --   --   --   --   < > = values in this interval not displayed. GFR: Estimated Creatinine Clearance: 45.8 mL/min (by C-G formula based on Cr of 1.29). Liver Function Tests:  Recent Labs Lab 07/12/15 1335 07/17/15 1421  AST 32 18  ALT 34 19  ALKPHOS 106 55  BILITOT 0.6 0.5  PROT 9.5* 6.2*  ALBUMIN 5.1* 3.1*   No results for input(s): LIPASE, AMYLASE in the last 168 hours.  Recent Labs Lab  07/17/15 1408  AMMONIA 37*   Coagulation Profile:  Recent Labs Lab 07/14/15 1223  INR 1.12   Cardiac Enzymes:  Recent Labs Lab 07/12/15 1901  CKTOTAL 137   BNP (last 3 results) No results for input(s): PROBNP in the last 8760 hours. HbA1C: No results for input(s): HGBA1C in the last 72 hours. CBG:  Recent Labs Lab 07/12/15 1433  GLUCAP 190*   Lipid Profile: No results for input(s): CHOL, HDL, LDLCALC, TRIG, CHOLHDL, LDLDIRECT in the last 72 hours. Thyroid Function Tests: No results for input(s): TSH, T4TOTAL, FREET4, T3FREE, THYROIDAB in the last 72 hours. Anemia Panel: No results for input(s): VITAMINB12, FOLATE, FERRITIN, TIBC, IRON, RETICCTPCT in the last 72 hours. Urine analysis:  Component Value Date/Time   COLORURINE STRAW* 07/12/2015 1518   COLORURINE Yellow 06/10/2014 0021   APPEARANCEUR CLEAR* 07/12/2015 1518   APPEARANCEUR Hazy 06/10/2014 0021   LABSPEC 1.011 07/12/2015 1518   LABSPEC 1.005 06/10/2014 0021   PHURINE 8.0 07/12/2015 1518   PHURINE 5.0 06/10/2014 0021   GLUCOSEU >500* 07/12/2015 1518   GLUCOSEU Negative 06/10/2014 0021   HGBUR 1+* 07/12/2015 1518   HGBUR 1+ 06/10/2014 0021   BILIRUBINUR NEGATIVE 07/12/2015 1518   BILIRUBINUR Negative 06/10/2014 0021   KETONESUR TRACE* 07/12/2015 1518   KETONESUR Negative 06/10/2014 0021   PROTEINUR >500* 07/12/2015 1518   PROTEINUR Negative 06/10/2014 0021   NITRITE NEGATIVE 07/12/2015 1518   NITRITE Negative 06/10/2014 0021   LEUKOCYTESUR NEGATIVE 07/12/2015 1518   LEUKOCYTESUR 2+ 06/10/2014 0021   Sepsis Labs: @LABRCNTIP (procalcitonin:4,lacticidven:4)  ) Recent Results (from the past 240 hour(s))  CULTURE, BLOOD (ROUTINE X 2) w Reflex to ID Panel     Status: None   Collection Time: 07/12/15  5:00 PM  Result Value Ref Range Status   Specimen Description BLOOD RIGHT HAND  Final   Special Requests BOTTLES DRAWN AEROBIC AND ANAEROBIC  10CC  Final   Culture NO GROWTH 5 DAYS  Final   Report  Status 08-01-2015 FINAL  Final  CULTURE, BLOOD (ROUTINE X 2) w Reflex to ID Panel     Status: None   Collection Time: 07/12/15  5:49 PM  Result Value Ref Range Status   Specimen Description BLOOD LEFT ARM  Final   Special Requests BOTTLES DRAWN AEROBIC AND ANAEROBIC  10CC  Final   Culture NO GROWTH 5 DAYS  Final   Report Status 08-01-15 FINAL  Final  CSF culture     Status: None   Collection Time: 07/14/15  3:37 PM  Result Value Ref Range Status   Specimen Description CSF  Final   Special Requests Normal  Final   Gram Stain   Final    WBC PRESENT,BOTH PMN AND MONONUCLEAR NO ORGANISMS SEEN CYTOSPIN SMEAR Results Called to: AMY DALTON AT [id number] 07/14/15 MLZ    Culture NO GROWTH 3 DAYS  Final   Report Status 08/01/2015 FINAL  Final      Radiology Studies: Mr Jeri Cos [id number] Contrast  08/01/15  CLINICAL DATA:  Altered mental status, follow-up hippocampal abnormality. Cerebral spinal fluid negative for HSV. History of hypertension, hyperlipidemia and mini stroke. EXAM: MRI HEAD WITHOUT AND WITH CONTRAST TECHNIQUE: Multiplanar, multiecho pulse sequences of the brain and surrounding structures were obtained without and with intravenous contrast. CONTRAST:  3mL MULTIHANCE GADOBENATE DIMEGLUMINE 529 MG/ML IV SOLN COMPARISON:  Limited MRI of the brain Jul 13, 2015 an MRI of the head May 14, 2015 FINDINGS: INTRACRANIAL CONTENTS: Mild reduced diffusion within the bilateral hippocampus. T2 bright edema and faint enhancement within the bilateral hippocampi obscuring the morphology. No susceptibility artifact to suggest hemorrhage. The ventricles and sulci are normal for patient's age. No suspicious parenchymal signal, mass lesions, mass effect. Scattered subcentimeter supratentorial white matter FLAIR T2 hyperintensities. 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 No abnormal intraparenchymal or extra-axial enhancement. No abnormal extra-axial fluid collections. No extra-axial masses. Normal major intracranial  vascular flow voids present at skull base. ORBITS: The included ocular globes and orbital contents are non-suspicious. SINUSES: The mastoid air-cells and included paranasal sinuses are well-aerated. SKULL/SOFT TISSUES: No abnormal sellar expansion. No suspicious calvarial bone marrow signal. Craniocervical junction maintained. 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 Mild white matter changes most compatible with chronic small vessel ischemic disease. 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 Electronically Signed   By: Elon Alas M.D.   On: 07/17/2015 04:09     Scheduled Meds: . amLODipine  10 mg Oral Daily  . aspirin EC  81 mg Oral Daily  . carvedilol  25 mg Oral BID WC  . cholecalciferol  5,000 Units Oral Daily  . citalopram  40 mg Oral Daily  . cloNIDine  0.1 mg Oral BID  . diatrizoate meglumine-sodium  15 mL Oral Q1 Hr x 2  . divalproex  500 mg Oral Q8H  . heparin  5,000 Units Subcutaneous Q8H  . pantoprazole  40 mg Oral Daily  . potassium chloride  20 mEq Oral Daily  . sodium chloride flush  3 mL Intravenous Q12H   Continuous Infusions:    LOS: 3 days   Time Spent in minutes   30 minutes  Dorothie Wah D.O. on 07/18/2015 at 1:07 PM  Between 7am to 7pm - Pager - [phone]  After 7pm go to [email or web address] - password [email or web address]  And look for the night coverage person covering for me after hours  [facility] Office  [phone]

## 2015-07-18 NOTE — Procedures (Signed)
Electroencephalogram report- LTM  Ordering Physician : Dr. Joylene Igo    Beginning date or time: 07/17/2015 1:30PM Ending date or time: 07/18/2015 10:30AM  Day of study: day 1  Medications include: Per EMR  HISTORY: This 24 hours of intensive EEG monitoring with simultaneous video monitoring was performed for this patient with altered mental status. This EEG was requested to rule out subclinical electrographic seizures.  TECHNICAL DESCRIPTION:  The study consists of a continuous 16-channel multi-montage digital video EEG recording with twenty-one electrodes placed according to the International 10-20 System. Additional leads included eye leads, true temporal leads (T1, T2), and an EKG lead. Activation procedures were not done due to mental status.  REPORT: The background activity in this tracing consisted of polymorphic 6-7 Hz theta background. The activity was symmetrical and reactive to stimuli.   No clear electrographic seizures were seen.  IMPRESSION: This is an abnormal EEG due to: 1) Mild diffuse slowing.  CLINICAL CORRELATION: This EEG is consistent with mild diffuse encephalopathy. No electrographic seizures were seen.

## 2015-07-18 NOTE — Progress Notes (Signed)
Physical Therapy Treatment Patient Details Name: Sandra Charles MRN: [id number] DOB: 1955-09-25 Today's Date: 07/18/2015    History of Present Illness Sandra Charles is an 60 y.o. female admitted to [facility] with confusion. Per chart: fiancee reports that she was delusional and Sister reports that she could not see anything as well.Patient remained confused while at [facility] and EEG was obtained showing no evidence of seizure. LP was obtained. Due to need for LTM she was transferred to [facility] hospital 5/19. PMHx- admitted in March 2017 with confusion ? Seizure work up was negative at that time; HTN; TIA;     PT Comments    Patient continues to demo confusion and c/o dizziness when sitting and standing and reports that it goes away when she is lying down. Pt very anxious about OOB mobility and ambulated with RW this session. Pt needed step by step instructions for initiation of tasks and repeated "what do I do now?" throughout session. Attempt use of SPC next session.   Follow Up Recommendations  Home health PT;Supervision/Assistance - 24 hour (24/7 due to AMS)     Equipment Recommendations  Other (comment) (TBA; functional status continues to widely fluctuate)    Recommendations for Other Services       Precautions / Restrictions Precautions Precautions: Fall Restrictions Weight Bearing Restrictions: No    Mobility  Bed Mobility Overal bed mobility: Needs Assistance Bed Mobility: Supine to Sit;Sit to Supine     Supine to sit: Supervision Sit to supine: Supervision   General bed mobility comments: increased time and cues to initiate movement and for positioning prior to standing  Transfers Overall transfer level: Needs assistance Equipment used: None Transfers: Sit to/from Stand Sit to Stand: Min assist         General transfer comment: HHA upon standing due to pt report of dizziness with all OOB mobility as well as in sitting; VSS  Ambulation/Gait Ambulation/Gait  assistance: Min assist;Min guard Ambulation Distance (Feet): 200 Feet Assistive device: Rolling walker (2 wheeled);1 person hand held assist Gait Pattern/deviations: Step-through pattern;Decreased stride length;Narrow base of support     General Gait Details: ambulated in room HHA with pt tending to hold onto objects (reported she tended to furniture walk and hold onto things at home PTA); pt c/o dizziness and very anxious about ambulating without RW; cues given for safe use of AD, increased bilat step length, and step by step cues for directions with pt appearing to not know what to do when doors needed to be opened and asked "what do I do now?" throughout session; pt steady with RW but with very slow cadenece and short steps; pt c/o increased dizziness with head turns   Stairs Stairs: Yes Stairs assistance: Min guard Stair Management: One rail Left;Sideways Number of Stairs: 2 (1x2) General stair comments: pt too anxious to attempt more steps this session; cues for sequencing and technique  Wheelchair Mobility    Modified Rankin (Stroke Patients Only)       Balance Overall balance assessment: Needs assistance Sitting-balance support: No upper extremity supported;Feet supported Sitting balance-Leahy Scale: Normal     Standing balance support: No upper extremity supported Standing balance-Leahy Scale: Fair Standing balance comment: static standing                    Cognition Arousal/Alertness: Awake/alert Behavior During Therapy: WFL for tasks assessed/performed;Flat affect Overall Cognitive Status: Impaired/Different from baseline Area of Impairment: Problem solving;Following commands;Attention;Memory Orientation Level:  (knew hospital and with cues  MC; not sure why she is here) Current Attention Level: Selective Memory: Decreased short-term memory Following Commands: Follows one step commands with increased time;Follows multi-step commands with increased time      Problem Solving: Slow processing;Difficulty sequencing;Requires verbal cues;Decreased initiation General Comments: pt required cues and step by step instructions for initiation of all tasks performed and appeared confused and anxious with all OOB mobility    Exercises      General Comments        Pertinent Vitals/Pain Pain Assessment: No/denies pain    Home Living                      Prior Function            PT Goals (current goals can now be found in the care plan section) Acute Rehab PT Goals Patient Stated Goal: go home PT Goal Formulation: With patient Time For Goal Achievement: 07/23/15 Potential to Achieve Goals: Fair    Frequency  Min 3X/week    PT Plan Current plan remains appropriate    Co-evaluation             End of Session Equipment Utilized During Treatment: Gait belt Activity Tolerance: Patient tolerated treatment well Patient left: with call bell/phone within reach;in bed;with bed alarm set     Time: [id number] PT Time Calculation (min) (ACUTE ONLY): 33 min  Charges:  $Gait Training: 8-22 mins $Therapeutic Activity: 8-22 mins                    G Codes:      Salina April, PTA Pager: [phone]   07/18/2015, 11:39 AM

## 2015-07-18 NOTE — Progress Notes (Signed)
Subjective: No complaints. Feels she may be slightly improved.   Of note: " ID at [facility] and have been following her case. Very interesting. I would suggest a CT chest, abd pelvis to look for malignancy.  Thanks Vanetta Shawl"  Exam: Filed Vitals:   07/17/15 2126 07/18/15 0656  BP: 134/60 159/70  Pulse: 79 76  Temp: 99.3 F (37.4 C) 99.2 F (37.3 C)  Resp: 18 18     Gen: In bed, NAD MS: alert to hospital and [facility] not month, year. ablet o name objects and repatwords.  CN: 2-12 intact Motor: MAEW Sensory: intact throughout   Pertinent Labs/Diagnostics: CSF Arbovirus pending MNDA and VGKC pending  Etta Quill PA-C [facility] [phone]  Impression: 60 yo F with altered mental status and relatively new onset seizures. She appears to have made progress per previous notes, but still with significnat short term memory loss. This is not surprising given imaging findings which I suspect was due to prolonged seizures prior to arrival.   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   Recommendations: 1) I have asked for autoimmune epilepsy panel to be sent on CSF from [facility].  2) d/c prolonged EEG 3) serum VGKC and nmda pending 4) thyroid antibodies 5) will follow.   Roland Rack, MD [facility] [phone]  If 7pm- 7am, please page neurology on call as listed in [place].  07/18/2015, 9:09 AM

## 2015-07-19 DIAGNOSIS — R569 Unspecified convulsions: Secondary | ICD-10-CM

## 2015-07-19 LAB — BASIC METABOLIC PANEL
Anion gap: 9 (ref 5–15)
BUN: 13 mg/dL (ref 6–20)
CALCIUM: 9.1 mg/dL (ref 8.9–10.3)
CHLORIDE: 108 mmol/L (ref 101–111)
CO2: 25 mmol/L (ref 22–32)
CREATININE: 1.26 mg/dL — AB (ref 0.44–1.00)
GFR calc non Af Amer: 46 mL/min — ABNORMAL LOW (ref >60)
GFR, EST AFRICAN AMERICAN: 54 mL/min — AB (ref >60)
Glucose, Bld: 88 mg/dL (ref 65–99)
Potassium: 4.1 mmol/L (ref 3.5–5.1)
SODIUM: 142 mmol/L (ref 135–145)

## 2015-07-19 LAB — CBC
HEMATOCRIT: 35.4 % — AB (ref 36.0–46.0)
HEMOGLOBIN: 11.2 g/dL — AB (ref 12.0–15.0)
MCH: 27.4 pg (ref 26.0–34.0)
MCHC: 31.6 g/dL (ref 30.0–36.0)
MCV: 86.6 fL (ref 78.0–100.0)
Platelets: 185 10*3/uL (ref 150–400)
RBC: 4.09 MIL/uL (ref 3.87–5.11)
RDW: 14.3 % (ref 11.5–15.5)
WBC: 6.6 10*3/uL (ref 4.0–10.5)

## 2015-07-19 MED ORDER — LEVETIRACETAM 750 MG PO TABS: 750.0000 mg | ORAL_TABLET | Freq: Two times a day (BID) | ORAL | Status: DC

## 2015-07-19 MED ORDER — CEFUROXIME AXETIL 500 MG PO TABS: 500.0000 mg | ORAL_TABLET | Freq: Two times a day (BID) | ORAL | Status: DC

## 2015-07-19 MED ORDER — DIVALPROEX SODIUM 500 MG PO DR TAB: 500.0000 mg | DELAYED_RELEASE_TABLET | Freq: Three times a day (TID) | ORAL | Status: DC

## 2015-07-19 NOTE — Progress Notes (Signed)
Subjective: Still with short term memory complaints.   Exam: Filed Vitals:   07/18/15 2213 07/19/15 0640  BP: 137/65 153/64  Pulse: 65 60  Temp: 98 F (36.7 C) 98.4 F (36.9 C)  Resp: 18 18     Gen: In bed, NAD MS: alert  CN: PERRL, EOMI Motor: MAEW Sensory: intact throughout   Pertinent Labs/Diagnostics: CSF Arbovirus pending MNDA and VGKC pending  Etta Quill PA-C [facility] [phone]  Impression: 60 yo F with altered mental status and relatively new onset seizures. She appears to have made progress per previous notes, but still with significnat short term memory loss. This is not surprising given imaging findings which I suspect was due to prolonged seizures prior to arrival.   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   Recommendations: 1) I have asked for autoimmune epilepsy panel to be sent on CSF from [facility].  2) d/c prolonged EEG 3) serum VGKC and nmda pending 4) thyroid antibodies pending 5) will follow.   Roland Rack, MD [facility] [phone]  If 7pm- 7am, please page neurology on call as listed in [place].  07/19/2015, 11:17 AM

## 2015-07-19 NOTE — Progress Notes (Signed)
Discharge instructions RX's and follow up appts explained and provided to family  (Family  has already scheduled appt with family care and neurology) verbalized understanding. Patient d/c home with family and home health was set up for patient rolling walker and BSC brought to room and sent home with patient. Patient left floor via wheelchair accompanied by volunteers no c/o pain or shortness of breath at d/c.  Aleigha Gilani, Tivis Ringer, RN

## 2015-07-19 NOTE — Care Management Note (Signed)
Case Management Note  Patient Details  Name: Sandra Charles MRN: [id number] Date of Birth: August 02, 1955  Subjective/Objective:                 Patient with acute encephalopathy to be discharged today. Will have 24 hours supervision in home provided by significant other, son and sisters. 3in1, and RW to be delivered to room prior to discharge. HHA RN and SW to be provided through [place], referrals made. Outpatient PT eval order faxed to [facility] outpatient PT and Rx given to family along with paper copy of [facility] agencies servicing [place]. 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   Action/Plan:   Expected Discharge Date:                  Expected Discharge Plan:  [place] (does not qualify for [facility] PT w/ medicaid)  In-House Referral:  Clinical Social Work  Discharge planning Services  CM Consult  Post Acute Care Choice:  Durable Medical Equipment, Home Health Choice offered to:  Patient, Adult Children  DME Arranged:  3-N-1, Walker rolling DME Agency:  [place]:  RN, Nurse's Aide, Social Work [employer] Agency:  [employer] (first choice Alvis Lemmings unable to take, [place] 2nd choice able to take.  Son and sister in room updated.)  Status of Service:  Completed, signed off  Medicare Important Message Given:    Date Medicare IM Given:    Medicare IM give by:    Date Additional Medicare IM Given:    Additional Medicare Important Message give by:     If discussed at [place] of Stay Meetings, dates discussed:    Additional Comments:  Carles Collet, RN 07/19/2015, 11:35 AM

## 2015-07-19 NOTE — Progress Notes (Signed)
Physical Therapy Treatment Patient Details Name: Sandra Charles MRN: [id number] DOB: 03-11-1955 Today's Date: 07/19/2015    History of Present Illness Sandra Charles is an 60 y.o. female admitted to [facility] with confusion. Per chart: fiancee reports that she was delusional and Sister reports that she could not see anything as well.Patient remained confused while at [facility] and EEG was obtained showing no evidence of seizure. LP was obtained. Due to need for LTM she was transferred to [facility] 5/19. PMHx- admitted in March 2017 with confusion ? Seizure work up was negative at that time; HTN; TIA;     PT Comments    Patient able to trial [facility] today, but not enough support for now due to balance and continued dizziness when up on her feet.  Feel she will have appropriate level of family support at d/c and getting a walker prior to d/c home today for safety.  Feel dizziness more related to medications than vestibular cause.  PT signing off due to planned d/c home today.  Follow Up Recommendations  Home health PT;Supervision/Assistance - 24 hour     Equipment Recommendations  Rolling walker with 5" wheels    Recommendations for Other Services       Precautions / Restrictions Precautions Precautions: Fall    Mobility  Bed Mobility               General bed mobility comments: in recliner  Transfers Overall transfer level: Needs assistance     Sit to Stand: Supervision         General transfer comment: up from recliner increased time  Ambulation/Gait Ambulation/Gait assistance: Min guard;Min assist Ambulation Distance (Feet): 40 Feet Assistive device: Straight cane Gait Pattern/deviations: Decreased stride length;Step-through pattern;Wide base of support     General Gait Details: HHA on opposite hand of cane by family in room due to pt feeling unsteady and needing both UE support   Stairs            Wheelchair Mobility    Modified Rankin (Stroke Patients  Only)       Balance Overall balance assessment: Needs assistance         Standing balance support: No upper extremity supported Standing balance-Leahy Scale: Fair Standing balance comment: static standing without UE support, relies on UE assist for ambulation                    Cognition Arousal/Alertness: Awake/alert Behavior During Therapy: Flat affect Overall Cognitive Status: Impaired/Different from baseline Area of Impairment: Problem solving;Following commands;Attention;Memory   Current Attention Level: Selective Memory: Decreased short-term memory Following Commands: Follows one step commands with increased time;Follows multi-step commands with increased time     Problem Solving: Slow processing;Requires verbal cues      Exercises      General Comments General comments (skin integrity, edema, etc.): family in the room awaiting equipment delivery for d/c.  Report pt will get only one PT visit due to insurance.  Discussed measurement for walker and cane fitting as well as need to use walker at least initially for balance/safety at home as well as importance of family support for safety      Pertinent Vitals/Pain Pain Assessment: No/denies pain    Home Living                      Prior Function            PT Goals (current goals can now be  found in the care plan section) Progress towards PT goals: Progressing toward goals    Frequency  Min 3X/week    PT Plan Current plan remains appropriate    Co-evaluation             End of Session   Activity Tolerance: Patient tolerated treatment well Patient left: in chair;with call bell/phone within reach;with family/visitor present     Time: 1211-1223 PT Time Calculation (min) (ACUTE ONLY): 12 min  Charges:  $Gait Training: 8-22 mins                    G Codes:      Reginia Naas 08-02-2015, 1:28 PM  Magda Kiel, [place] 2015/08/02

## 2015-07-19 NOTE — Discharge Summary (Addendum)
Physician Discharge Summary  Sandra Charles [id number] DOB: 1955/12/28 DOA: 07/15/2015  PCP: Deborha Payment, MD  Admit date: 07/15/2015 Discharge date: 07/19/2015  Time spent: 45 minutes  Recommendations for Outpatient Follow-up:  Patient will be discharged to home with home health RN, aide, social work.  Patient will need to follow up with primary care provider within one week of discharge.  Follow up with neurology. Patient should continue medications as prescribed.  Patient should follow a heart healthy diet.  Patient should not drive and should be supervised.   Discharge Diagnoses:  Acute encephalopathy Essential hypertension GERD Hypokalemia Vitamin B-12 deficiency History of hyperlipidemia  Discharge Condition: Stable  Diet recommendation: Heart healthy  Filed Weights   07/15/15 1425  Weight: 68.7 kg (151 lb 7.3 oz)    History of present illness:  On 07/15/2015 by Dr. Emeline Gins Elgergawy Taigan Charles is a 60 y.o. 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 -LP: 10WBC lymphocytes, normal protein and glucose -HSV negative, CSF culture shows no growth -Blood cultures from 5/16 show no growth to date -On 5/17 MRI Brain showed abnormal diffusion signal in both hippocampal formations, Suspicious for status epilepticus -Neurology consulted and appreciated, had continuous video EEG monitoring -Patient is still confused and does not know why she is here Sandra Charles -Continue Vimpat 50mg  BID, Depakote 500mg  TID -Acyclovir discontinued -Vimpat was discontinued  -EEG 07/16/15: EEG is consistent with Focal neuronal dysfunction in the bilateral temporal regions, left more than right. There is also suggestion of cortical irritability in the left mid-temporal region. 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 Leonel Ramsay, felt that patient may have short-term memory loss for quite sometime. Recommended continuing Depakote, 750 mg twice a day for ease of dosing. -Infectious disease at [facility] recommended CT chest/abd/pelvis to rule out malignancy. CT chest, abdomen, pelvis negative for malignancy -PT recommended home health and supervision -Will discharge patient with home health social work, Engineer, production, Therapist, sports -Autoimmune epilepsy panel on CSF, serum VGKC and NDA, thyroid antibodies pending  Acute cystitis/pyelonephritis -Incidentally found on CT scan -although patient currently asymptomatic, with no leukocytosis or fever -Spoke with Dr. Johnnye Charles, ID, who recommended 1 week of treatment with oral agent -Will discharge with ceftin 500mg  BID for one week  Essential Hypertension -Continue Coreg, amlodipine, clonidine   GERD -Continue PPI  Hypokalemia  -Resolved, continue to monitor BMP   Vitamin B 12 deficiency -Continue  supplement  History of hyperlipidemia -Pravastatin has been stopped as it may be contributing to altered mental  status  Consultants Neurology  Procedures  EEG Continuous EEG  Discharge Exam: Filed Vitals:   07/18/15 2213 07/19/15 0640  BP: 137/65 153/64  Pulse: 65 60  Temp: 98 F (36.7 C) 98.4 F (36.9 C)  Resp: 18 18   Exam  General: Well developed, well nourished, NAD  HEENT: NCAT,mucous membranes moist.   Cardiovascular: S1 S2 auscultated, no murmurs, RRR  Respiratory: Clear to auscultation bilaterally  Abdomen: Soft, nontender, nondistended, + bowel sounds  Extremities: warm dry without cyanosis clubbing or edema  Neuro: AAOx2 (self and place, not time or situation), nonfocal  Discharge Instructions      Discharge Instructions    Diet - low sodium heart healthy    Complete by:  As directed      Discharge instructions    Complete by:  As directed   Patient will be discharged to home with home health RN, aide, social work.  Patient will need to follow up with primary care provider within one week of discharge.  Patient will also need to follow up with neurology. Patient should continue medications as prescribed.  Patient should follow a heart healthy diet.  Patient should not drive and should be supervised.            Medication List    STOP taking these medications        acyclovir 545 mg in dextrose 5 % 100 mL     lacosamide 50 MG Tabs tablet  Commonly known as:  VIMPAT      TAKE these medications        ALPRAZolam 1 MG tablet  Commonly known as:  XANAX  Take 1 mg by mouth 3 (three) times daily as needed for anxiety.     amLODipine 10 MG tablet  Commonly known as:  NORVASC  Take 10 mg by mouth daily.     aspirin EC 81 MG tablet  Take 81 mg by mouth daily.     carvedilol 25 MG tablet  Commonly known as:  COREG  Take 25 mg by mouth 2 (two) times daily with a meal.     cefUROXime 500 MG tablet  Commonly known as:  CEFTIN  Take 1  tablet (500 mg total) by mouth 2 (two) times daily with a meal.     citalopram 40 MG tablet  Commonly known as:  CELEXA  Take 40 mg by mouth daily.     cloNIDine 0.1 MG tablet  Commonly known as:  CATAPRES  Take 1 tablet (0.1 mg total) by mouth 2 (two) times daily.     cyanocobalamin 1000 MCG/ML injection  Commonly known as:  (VITAMIN B-12)  Inject 1 mL (1,000 mcg total) into the muscle daily.     divalproex 500 MG DR tablet  Commonly known as:  DEPAKOTE  Take 1 tablet (500 mg total) by mouth every 8 (eight) hours.     HYDROcodone-acetaminophen 5-325 MG tablet  Commonly known as:  NORCO  Take 1 tablet by mouth every 6 (six) hours as needed for moderate pain.     pantoprazole 40 MG tablet  Commonly known as:  PROTONIX  Take 1 tablet (40 mg total) by mouth daily.     Vitamin D3 5000 units Tabs  Take 5,000 Units by mouth daily.       No Known Allergies Follow-up Information    Follow up with [facility].   Specialty:  General Practice   Why:  Call to schedule follow up appointment  with Primary MD   Contact information:   [place] [address] [phone]       Follow up with [facility].   Why:  for home health [place], and Social Work. Will Call in next 24 to 48 hours to set up first home health appointment.   Contact information:   [place] [address] [phone]        The results of significant diagnostics from this hospitalization (including imaging, microbiology, ancillary and laboratory) are listed below for reference.    Significant Diagnostic Studies: Ct Head Wo Contrast  07/12/2015  CLINICAL DATA:  Confusion and history of prior infarcts EXAM: CT HEAD WITHOUT CONTRAST TECHNIQUE: Contiguous axial images were obtained from the base of the skull through the vertex without intravenous contrast. COMPARISON:  05/14/2015 FINDINGS: Bony calvarium is intact. No gross soft  tissue abnormality is noted. No findings to suggest acute hemorrhage, acute infarction or space-occupying mass lesion are noted. IMPRESSION: No acute abnormality noted. Electronically Signed   By: Inez Catalina M.D.   On: 07/12/2015 15:15   Ct Chest W Contrast  07/18/2015  CLINICAL DATA:  60 year old female inpatient with altered mental status and fever. Bilateral hippocampal edema on MRI. History of hysterectomy. EXAM: CT CHEST, ABDOMEN, AND PELVIS WITH CONTRAST TECHNIQUE: Multidetector CT imaging of the chest, abdomen and pelvis was performed following the standard protocol during bolus administration of intravenous contrast. CONTRAST:  100 cc ISOVUE-300 IOPAMIDOL (ISOVUE-300) INJECTION 61% COMPARISON:  01/21/2004 chest CT angiogram and 07/04/2011 CT abdomen/pelvis. FINDINGS: Motion degraded study. CT CHEST Mediastinum/Nodes: Top-normal heart size. No significant pericardial fluid/ thickening. Great vessels are normal in course and caliber. No central pulmonary emboli. Normal visualized thyroid. Normal esophagus. No pathologically enlarged axillary, mediastinal or hilar lymph nodes. Lungs/Pleura: No pneumothorax. No pleural effusion. Superior right middle lobe subpleural 4 mm pulmonary nodule (series 5/image 50), stable since 2005 and benign. No acute consolidative airspace disease, new significant pulmonary nodules or lung masses. Hypoventilatory change in the dependent lungs. Musculoskeletal: No aggressive appearing focal osseous lesions. Mild degenerative changes in the thoracic spine. CT ABDOMEN AND PELVIS Hepatobiliary: Normal liver size. Mild diffuse heterogeneity of the pancreatic parenchyma on the initial sequence is favored to be due to contrast timing as the liver appears relatively homogeneous in enhancement on the delayed sequence. No discrete liver mass. Normal gallbladder with no radiopaque cholelithiasis. No biliary ductal dilatation. Pancreas: Normal, with no mass or duct dilation. Spleen: Normal  size. No mass. Adrenals/Urinary Tract: Normal adrenals. No hydronephrosis. There are multiple small to moderate areas of renal cortical scarring in both kidneys predominantly at the poles of the kidneys, most prominent in the left lower kidney. There is prominent patchiness of the contrast nephrograms bilaterally. Minimal bilateral perinephric fat stranding. No renal masses or fluid collections. There is borderline mild diffuse bladder wall thickening with mild haziness of the perivesical fat, suggesting acute cystitis. Stomach/Bowel: Grossly normal stomach. Normal caliber small bowel with no small bowel wall thickening. Normal appendix. Normal large bowel with no diverticulosis, large bowel wall thickening or pericolonic fat stranding. Vascular/Lymphatic: Normal caliber abdominal aorta. Patent portal, splenic, hepatic and renal veins. No pathologically enlarged lymph nodes in the abdomen or pelvis. Reproductive: Status post hysterectomy, with no abnormal findings at the vaginal cuff. No adnexal mass. Other: No pneumoperitoneum, ascites or focal fluid collection. Musculoskeletal: No aggressive appearing focal osseous lesions. Fat stranding and gas in the subcutaneous fat of the bilateral ventral  abdominal wall is likely related to subcutaneous injection of medications. IMPRESSION: 1. Borderline mild diffuse bladder wall thickening and perivesical fat haziness, suggesting acute cystitis. Correlate with urinalysis. 2. Patchy contrast nephrograms bilaterally with minimal perinephric fat stranding, suggesting bilateral acute pyelonephritis. Multifocal renal cortical scarring in both kidneys suggests recurrent pyelonephritis. No hydronephrosis. No renal abscess. 3. No evidence of a primary malignancy or metastatic disease in the chest, abdomen or pelvis. Electronically Signed   By: Ilona Sorrel M.D.   On: 07/18/2015 15:16   Mr Jeri Cos [id number] Contrast  07/17/2015  CLINICAL DATA:  Altered mental status, follow-up  hippocampal abnormality. Cerebral spinal fluid negative for HSV. History of hypertension, hyperlipidemia and mini stroke. EXAM: MRI HEAD WITHOUT AND WITH CONTRAST TECHNIQUE: Multiplanar, multiecho pulse sequences of the brain and surrounding structures were obtained without and with intravenous contrast. CONTRAST:  32mL MULTIHANCE GADOBENATE DIMEGLUMINE 529 MG/ML IV SOLN COMPARISON:  Limited MRI of the brain Jul 13, 2015 an MRI of the head May 14, 2015 FINDINGS: INTRACRANIAL CONTENTS: Mild reduced diffusion within the bilateral hippocampus. T2 bright edema and faint enhancement within the bilateral hippocampi obscuring the morphology. No susceptibility artifact to suggest hemorrhage. The ventricles and sulci are normal for patient's age. No suspicious parenchymal signal, mass lesions, mass effect. Scattered subcentimeter supratentorial white matter FLAIR T2 hyperintensities. 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 No abnormal intraparenchymal or extra-axial enhancement. No abnormal extra-axial fluid collections. No extra-axial masses. Normal major intracranial vascular flow voids present at skull base. ORBITS: The included ocular globes and orbital contents are non-suspicious. SINUSES: The mastoid air-cells and included paranasal sinuses are well-aerated. SKULL/SOFT TISSUES: No abnormal sellar expansion. No suspicious calvarial bone marrow signal. Craniocervical junction maintained. 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 Mild white matter changes most compatible with chronic small vessel ischemic disease. 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 Electronically Signed   By: Elon Alas M.D.   On: 07/17/2015 04:09   Ct Abdomen Pelvis W Contrast  07/18/2015  CLINICAL DATA:  60 year old female inpatient with altered mental status and fever. Bilateral hippocampal  edema on MRI. History of hysterectomy. EXAM: CT CHEST, ABDOMEN, AND PELVIS WITH CONTRAST TECHNIQUE: Multidetector CT imaging of the chest, abdomen and pelvis was performed following the standard protocol during bolus administration of intravenous contrast. CONTRAST:  100 cc ISOVUE-300 IOPAMIDOL (ISOVUE-300) INJECTION 61% COMPARISON:  01/21/2004 chest CT angiogram and 07/04/2011 CT abdomen/pelvis. FINDINGS: Motion degraded study. CT CHEST Mediastinum/Nodes: Top-normal heart size. No significant pericardial fluid/ thickening. Great vessels are normal in course and caliber. No central pulmonary emboli. Normal visualized thyroid. Normal esophagus. No pathologically enlarged axillary, mediastinal or hilar lymph nodes. Lungs/Pleura: No pneumothorax. No pleural effusion. Superior right middle lobe subpleural 4 mm pulmonary nodule (series 5/image 50), stable since 2005 and benign. No acute consolidative airspace disease, new significant pulmonary nodules or lung masses. Hypoventilatory change in the dependent lungs. Musculoskeletal: No aggressive appearing focal osseous lesions. Mild degenerative changes in the thoracic spine. CT ABDOMEN AND PELVIS Hepatobiliary: Normal liver size. Mild diffuse heterogeneity of the pancreatic parenchyma on the initial sequence is favored to be due to contrast timing as the liver appears relatively homogeneous in enhancement on the delayed sequence. No discrete liver mass. Normal gallbladder with no radiopaque cholelithiasis. No biliary ductal dilatation. Pancreas: Normal, with no mass or duct dilation. Spleen: Normal size. No mass. Adrenals/Urinary Tract: Normal adrenals. No hydronephrosis. There are multiple small to moderate areas  of renal cortical scarring in both kidneys predominantly at the poles of the kidneys, most prominent in the left lower kidney. There is prominent patchiness of the contrast nephrograms bilaterally. Minimal bilateral perinephric fat stranding. No renal masses or  fluid collections. There is borderline mild diffuse bladder wall thickening with mild haziness of the perivesical fat, suggesting acute cystitis. Stomach/Bowel: Grossly normal stomach. Normal caliber small bowel with no small bowel wall thickening. Normal appendix. Normal large bowel with no diverticulosis, large bowel wall thickening or pericolonic fat stranding. Vascular/Lymphatic: Normal caliber abdominal aorta. Patent portal, splenic, hepatic and renal veins. No pathologically enlarged lymph nodes in the abdomen or pelvis. Reproductive: Status post hysterectomy, with no abnormal findings at the vaginal cuff. No adnexal mass. Other: No pneumoperitoneum, ascites or focal fluid collection. Musculoskeletal: No aggressive appearing focal osseous lesions. Fat stranding and gas in the subcutaneous fat of the bilateral ventral abdominal wall is likely related to subcutaneous injection of medications. IMPRESSION: 1. Borderline mild diffuse bladder wall thickening and perivesical fat haziness, suggesting acute cystitis. Correlate with urinalysis. 2. Patchy contrast nephrograms bilaterally with minimal perinephric fat stranding, suggesting bilateral acute pyelonephritis. Multifocal renal cortical scarring in both kidneys suggests recurrent pyelonephritis. No hydronephrosis. No renal abscess. 3. No evidence of a primary malignancy or metastatic disease in the chest, abdomen or pelvis. Electronically Signed   By: Ilona Sorrel M.D.   On: 07/18/2015 15:16   Dg Chest Portable 1 View  07/12/2015  CLINICAL DATA:  Altered mental status. EXAM: PORTABLE CHEST 1 VIEW COMPARISON:  October 13, 2014. FINDINGS: The heart size and mediastinal contours are within normal limits. Both lungs are clear. No pneumothorax or pleural effusion is noted. The visualized skeletal structures are unremarkable. IMPRESSION: No acute cardiopulmonary abnormality seen. Electronically Signed   By: Marijo Conception, M.D.   On: 07/12/2015 16:06   Mr Brain  Ltd W/o Cm  07/13/2015  ADDENDUM REPORT: 07/13/2015 16:17 ADDENDUM: Study discussed by telephone with Dr. Alexis Goodell on 07/13/2015 At 1610 hours. She advises me that the patient has been febrile (102 F) and she started the patient on Acyclovir today. Electronically Signed   By: Genevie Ann M.D.   On: 07/13/2015 16:17  07/13/2015  CLINICAL DATA:  60 year old female with altered mental status. Confusion. Initial encounter. EXAM: MRI HEAD WITHOUT CONTRAST TECHNIQUE: Multiplanar, multiecho pulse sequences of the brain and surrounding structures were obtained without intravenous contrast. COMPARISON:  Head CT without contrast 07/12/2015. Brain MRI 05/14/2015, 05/27/2014. FINDINGS: The examination had to be discontinued prior to completion due to patient agitation. Subsequently, only axial diffusion weighted imaging was obtained. Cerebral volume appears stable. No ventriculomegaly. No intracranial mass effect is evident. Today there is abnormal increased trace diffusion signal in both hippocampal formations (series 3, image 54). ADC imaging in the same areas is isointense to mildly facilitated. No other diffusion abnormality identified. IMPRESSION: 1. Abnormal diffusion signal in both hippocampal formations. This pattern argues against vascular or ischemic etiology. Top differential considerations include status epilepticus and herpes encephalitis. 2. The examination had to be discontinued prior to completion due to patient agitation. Only axial diffusion-weighted imaging could be obtained. Electronically Signed: By: Genevie Ann M.D. On: 07/13/2015 16:00    Microbiology: Recent Results (from the past 240 hour(s))  CULTURE, BLOOD (ROUTINE X 2) w Reflex to ID Panel     Status: None   Collection Time: 07/12/15  5:00 PM  Result Value Ref Range Status   Specimen Description BLOOD RIGHT HAND  Final  Special Requests BOTTLES DRAWN AEROBIC AND ANAEROBIC  10CC  Final   Culture NO GROWTH 5 DAYS  Final   Report Status  07/17/2015 FINAL  Final  CULTURE, BLOOD (ROUTINE X 2) w Reflex to ID Panel     Status: None   Collection Time: 07/12/15  5:49 PM  Result Value Ref Range Status   Specimen Description BLOOD LEFT ARM  Final   Special Requests BOTTLES DRAWN AEROBIC AND ANAEROBIC  10CC  Final   Culture NO GROWTH 5 DAYS  Final   Report Status 07/17/2015 FINAL  Final  CSF culture     Status: None   Collection Time: 07/14/15  3:37 PM  Result Value Ref Range Status   Specimen Description CSF  Final   Special Requests Normal  Final   Gram Stain   Final    WBC PRESENT,BOTH PMN AND MONONUCLEAR NO ORGANISMS SEEN CYTOSPIN SMEAR Results Called to: AMY DALTON AT [id number] 07/14/15 MLZ    Culture NO GROWTH 3 DAYS  Final   Report Status 07/17/2015 FINAL  Final     Labs: Basic Metabolic Panel:  Recent Labs Lab 07/13/15 0514  07/15/15 0447 07/15/15 1612 07/16/15 0655 07/17/15 0617 07/18/15 0515 07/19/15 0408  NA 131*  < > 138  --  138 137 140 142  K 2.8*  < > 3.6  --  3.8 3.8 3.7 4.1  CL 96*  < > 104  --  102 99* 106 108  CO2 25  < > 25  --  23 23 25 25   GLUCOSE 131*  < > 108*  --  100* 91 89 88  BUN 9  < > 17  --  14 19 15 13   CREATININE 0.61  < > 1.00 1.31* 1.22* 1.49* 1.29* 1.26*  CALCIUM 9.7  < > 9.7  --  9.7 9.4 9.2 9.1  MG 2.1  --  2.1  --   --   --   --   --   < > = values in this interval not displayed. Liver Function Tests:  Recent Labs Lab 07/12/15 1335 07/17/15 1421  AST 32 18  ALT 34 19  ALKPHOS 106 55  BILITOT 0.6 0.5  PROT 9.5* 6.2*  ALBUMIN 5.1* 3.1*   No results for input(s): LIPASE, AMYLASE in the last 168 hours.  Recent Labs Lab 07/17/15 1408  AMMONIA 37*   CBC:  Recent Labs Lab 07/13/15 1924  07/15/15 1612 07/16/15 0655 07/17/15 0617 07/18/15 0515 07/19/15 0408  WBC  --   < > 12.4* 11.4* 10.0 8.3 6.6  NEUTROABS 10.7*  --   --   --   --   --   --   HGB  --   < > 14.1 14.0 12.7 11.8* 11.2*  HCT  --   < > 42.0 42.1 38.8 36.8 35.4*  MCV  --   < > 83.8 84.2 84.2  87.2 86.6  PLT  --   < > 202 201 180 183 185  < > = values in this interval not displayed. Cardiac Enzymes:  Recent Labs Lab 07/12/15 1901  CKTOTAL 137   BNP: BNP (last 3 results) No results for input(s): BNP in the last 8760 hours.  ProBNP (last 3 results) No results for input(s): PROBNP in the last 8760 hours.  CBG:  Recent Labs Lab 07/12/15 1433  GLUCAP 190*       Signed:  ERLEEN, LOCKMILLER  [facility] 07/19/2015, 1:07 PM

## 2015-07-19 NOTE — Discharge Instructions (Signed)
Epilepsy °People with epilepsy have times when they shake and jerk uncontrollably (seizures). This happens when there is a sudden change in brain function. Epilepsy may have many possible causes. Anything that disturbs the normal pattern of brain cell activity can lead to seizures. °HOME CARE  °· Follow your doctor's instructions about driving and safety during normal activities. °· Get enough sleep. °· Only take medicine as told by your doctor. °· Avoid things that you know can cause you to have seizures (triggers). °· Write down when your seizures happen and what you remember about each seizure. Write down anything you think may have caused the seizure to happen. °· Tell the people you live and work with that you have seizures. Make sure they know how to help you. They should: °¨ Cushion your head and body. °¨ Turn you on your side. °¨ Not restrain you. °¨ Not place anything inside your mouth. °¨ Call for local emergency medical help if there is any question about what has happened. °· Keep all follow-up visits with your doctor. This is very important. °GET HELP IF: °· You get an infection or start to feel sick. You may have more seizures when you are sick. °· You are having seizures more often. °· Your seizure pattern is changing. °GET HELP RIGHT AWAY IF:  °· A seizure does not stop after a few seconds or minutes. °· A seizure causes you to have trouble breathing. °· A seizure gives you a very bad headache. °· A seizure makes you unable to speak or use a part of your body. °  °This information is not intended to replace advice given to you by your health care provider. Make sure you discuss any questions you have with your health care provider. °  °Document Released: 12/10/2008 Document Revised: 12/03/2012 Document Reviewed: 09/24/2012 °Elsevier Interactive Patient Education ©2016 [employer] ° °

## 2015-07-20 LAB — THYROID PEROXIDASE ANTIBODY: THYROID PEROXIDASE ANTIBODY: 18 [IU]/mL (ref 0–34)

## 2015-07-20 LAB — THYROGLOBULIN ANTIBODY

## 2015-08-01 LAB — MISC LABCORP TEST (SEND OUT): LABCORP TEST CODE: 9985

## 2015-08-04 ENCOUNTER — Ambulatory Visit: Payer: Medicaid Other | Attending: Family Medicine | Admitting: Physical Therapy

## 2015-08-04 DIAGNOSIS — R262 Difficulty in walking, not elsewhere classified: Secondary | ICD-10-CM | POA: Insufficient documentation

## 2015-08-04 NOTE — Therapy (Signed)
[facility] [address], [place], [address] Phone: [phone]   Fax:  [phone]  Physical Therapy Evaluation  Patient Details  Name: Sandra Charles MRN: [id number] Date of Birth: [birth year] Referring Provider: Brion Aliment  Encounter Date: 08/04/2015      PT End of Session - 08/04/15 0927    Visit Number 1   Number of Visits 1   PT Start Time 0815   PT Stop Time 0915   PT Time Calculation (min) 60 min   Activity Tolerance Patient tolerated treatment well   Behavior During Therapy [facility] for tasks assessed/performed      Past Medical History  Diagnosis Date  . Hypertension   . Anxiety   . Depression   . Mini stroke ([place])     "several since 05/2014" (07/15/2015)  . Hyperlipidemia   . GERD (gastroesophageal reflux disease)   . Headache     "weekly" (07/15/2015)  . Seizures ([place]) dx'd 04/2015  . Arthritis     "all over"  . Chronic lower back pain     Past Surgical History  Procedure Laterality Date  . Vaginal hysterectomy    . Tubal ligation    . Dilation and curettage of uterus    . Lumbar puncture  07/14/2015    There were no vitals filed for this visit.       Subjective Assessment - 08/04/15 0826    Subjective Pt and daughter present for session. Pt reports she had a "brain infection" approximately 1 month ago, noted decr. strength and decr. balance at that time. Both are now improving. Pt denies falls and reports her strength is returning on its own.   Patient is accompained by: Family member   Limitations Walking   Patient Stated Goals to know what exercises to perform "on my own"   Currently in Pain? No/denies            [facility] PT Assessment - 08/04/15 0001    Assessment   Medical Diagnosis General strengthening   Referring Provider Brion Aliment   Hand Dominance Left   Prior Therapy none   Precautions   Precautions None   Restrictions   Weight Bearing Restrictions No    Balance Screen   Has the patient fallen in the past 6 months No   Has the patient had a decrease in activity level because of a fear of falling?  No   Is the patient reluctant to leave their home because of a fear of falling?  No   Prior Function   Level of Independence Independent   Vocation On disability   Leisure taking care of granddaughter, gardening, walking   Posture/Postural Control   Posture Comments FHP   ROM / Strength   AROM / PROM / Strength AROM;Strength   AROM   Overall AROM  Within functional limits for tasks performed   AROM Assessment Site Hip;Knee;Ankle;Lumbar   Strength   Overall Strength Within functional limits for tasks performed   Strength Assessment Site Hip;Knee;Ankle   Right/Left Hip Right;Left   Right Hip Flexion 5/5   Right Hip Extension 5/5   Right Hip External Rotation  5/5   Right Hip Internal Rotation 5/5   Right Hip ABduction 5/5   Right Hip ADduction 5/5   Left Hip Flexion 5/5   Left Hip Extension 5/5   Left Hip External Rotation 5/5   Left Hip Internal Rotation 5/5   Left Hip ABduction 5/5  Left Hip ADduction 5/5   Right/Left Knee Right;Left   Right Knee Flexion 5/5   Right Knee Extension 5/5   Left Knee Flexion 5/5   Left Knee Extension 5/5   Right/Left Ankle Right;Left   Right Ankle Dorsiflexion 5/5   Right Ankle Plantar Flexion 5/5   Right Ankle Inversion 5/5   Right Ankle Eversion 5/5   Left Ankle Dorsiflexion 5/5   Left Ankle Plantar Flexion 5/5   Left Ankle Inversion 5/5   Left Ankle Eversion 5/5   Transfers   Five time sit to stand comments  16 sec   Comments initially transfered with UE support, able to perform with no UE support with cuing.   Ambulation/Gait   Gait Comments gait initially guarded with short step length, improved considerably and pt had 1 m/s gait speed, normal stride/step length   High Level Balance   High Level Balance Comments 30 sec single leg balance with no LOB, one LOB with addition of head  turns. also able to peroform tandem stance with no difficulty.          Objective: Sit<>stand 3x30 sec "as fast as you can safely". Issued this as HEP.  YTB scapular retraction to elbow extension compound exercise, also issued GTB for progression at home, 3x10  YTB clock exercise for deltoid strengthening, x5 min total.  PT educated pt to walk 30 min daily, starting off with 10 min and gradually progressing. Did note O2 sat initially at 89%, but this improved to 97% quickly and maintained at a high level following this.                 PT Education - 08/04/15 [phone]    Education provided Yes   Education Details Educated pt on performance of HEP   Person(s) Educated Patient;Child(ren)   Methods Explanation;Demonstration;Tactile cues;Verbal cues   Comprehension Verbalized understanding;Returned demonstration             PT Long Term Goals - 08/04/15 0935    PT LONG TERM GOAL #1   Title Pt will verbalize understanding of HEP to progress to higher level activities at home.   Time 1   Period Days   Status New               Plan - 08/04/15 0928    Clinical Impression Statement Pt is a pleasant 60 y/o female who presents wtih c/o muscle weakness. Reports her strength and balance have improved considerably over the past two weeks. Currently pt displays no strength deficits and able high level balance. PT did issue HEP to pt to be performed at home designed to continue to incr. community ambulation and strength to return to gardening and playing with her granddaughter.    Rehab Potential Good   Clinical Impairments Affecting Rehab Potential recent "brain infection", significant improvement with no therapy, no additional visits allowed by insurance.   PT Frequency 1x / week   PT Duration 2 weeks   PT Treatment/Interventions Therapeutic exercise   PT Next Visit Plan no additional visits, issued HEP, encouraged pt to follow up with questions as needed.    Consulted and Agree with Plan of Care Patient      Patient will benefit from skilled therapeutic intervention in order to improve the following deficits and impairments:  Decreased strength, Decreased endurance, Difficulty walking  Visit Diagnosis: Difficulty in walking, not elsewhere classified - Plan: PT plan of care cert/re-cert     Problem List Patient Active Problem List  Diagnosis Date Noted  . Seizures ([place])   . Abnormal finding on MRI of brain   . Hypokalemia 07/15/2015  . Encephalitis 07/14/2015  . Confusion   . Altered mental status 05/14/2015  . GERD (gastroesophageal reflux disease) 11/25/2014  . Essential hypertension 11/25/2014    Fisher,Benjamin PT DPT 08/04/2015, 9:37 AM  [place] [facility] [address] [place], [address] Phone: [phone]   Fax:  [phone]  Name: Sandra Charles MRN: [id number] Date of Birth: [birth year]

## 2015-08-05 LAB — ARBOVIRUS PANEL, ~~LOC~~ LAB

## 2015-08-10 ENCOUNTER — Inpatient Hospital Stay
Admission: AD | Admit: 2015-08-10 | Discharge: 2015-08-18 | DRG: 885 | Disposition: A | Discharge status: Home / Self Care | Payer: Medicaid Other | Source: Intra-hospital | Attending: Psychiatry | Admitting: Psychiatry

## 2015-08-10 ENCOUNTER — Emergency Department
Admission: EM | Admit: 2015-08-10 | Discharge: 2015-08-10 | Disposition: A | Discharge status: Other Acute Inpatient Hospital | Payer: Medicaid Other | Attending: Emergency Medicine | Admitting: Emergency Medicine

## 2015-08-10 DIAGNOSIS — E785 Hyperlipidemia, unspecified: Secondary | ICD-10-CM | POA: Diagnosis not present

## 2015-08-10 DIAGNOSIS — Z90710 Acquired absence of both cervix and uterus: Secondary | ICD-10-CM

## 2015-08-10 DIAGNOSIS — R45850 Homicidal ideations: Secondary | ICD-10-CM | POA: Diagnosis present

## 2015-08-10 DIAGNOSIS — E876 Hypokalemia: Secondary | ICD-10-CM | POA: Diagnosis present

## 2015-08-10 DIAGNOSIS — F1220 Cannabis dependence, uncomplicated: Secondary | ICD-10-CM

## 2015-08-10 DIAGNOSIS — G8929 Other chronic pain: Secondary | ICD-10-CM | POA: Diagnosis present

## 2015-08-10 DIAGNOSIS — R443 Hallucinations, unspecified: Secondary | ICD-10-CM

## 2015-08-10 DIAGNOSIS — F419 Anxiety disorder, unspecified: Secondary | ICD-10-CM | POA: Diagnosis present

## 2015-08-10 DIAGNOSIS — F333 Major depressive disorder, recurrent, severe with psychotic symptoms: Secondary | ICD-10-CM | POA: Diagnosis not present

## 2015-08-10 DIAGNOSIS — Z79899 Other long term (current) drug therapy: Secondary | ICD-10-CM

## 2015-08-10 DIAGNOSIS — Z87891 Personal history of nicotine dependence: Secondary | ICD-10-CM | POA: Diagnosis not present

## 2015-08-10 DIAGNOSIS — Z803 Family history of malignant neoplasm of breast: Secondary | ICD-10-CM | POA: Diagnosis not present

## 2015-08-10 DIAGNOSIS — Z9119 Patient's noncompliance with other medical treatment and regimen: Secondary | ICD-10-CM

## 2015-08-10 DIAGNOSIS — G40409 Other generalized epilepsy and epileptic syndromes, not intractable, without status epilepticus: Secondary | ICD-10-CM | POA: Diagnosis present

## 2015-08-10 DIAGNOSIS — F250 Schizoaffective disorder, bipolar type: Secondary | ICD-10-CM | POA: Diagnosis present

## 2015-08-10 DIAGNOSIS — R440 Auditory hallucinations: Secondary | ICD-10-CM | POA: Insufficient documentation

## 2015-08-10 DIAGNOSIS — M545 Low back pain: Secondary | ICD-10-CM | POA: Diagnosis present

## 2015-08-10 DIAGNOSIS — Z888 Allergy status to other drugs, medicaments and biological substances status: Secondary | ICD-10-CM | POA: Diagnosis not present

## 2015-08-10 DIAGNOSIS — Z8249 Family history of ischemic heart disease and other diseases of the circulatory system: Secondary | ICD-10-CM | POA: Diagnosis not present

## 2015-08-10 DIAGNOSIS — R45851 Suicidal ideations: Secondary | ICD-10-CM | POA: Diagnosis present

## 2015-08-10 DIAGNOSIS — Z9851 Tubal ligation status: Secondary | ICD-10-CM

## 2015-08-10 DIAGNOSIS — I10 Essential (primary) hypertension: Secondary | ICD-10-CM | POA: Diagnosis present

## 2015-08-10 DIAGNOSIS — G40909 Epilepsy, unspecified, not intractable, without status epilepticus: Secondary | ICD-10-CM

## 2015-08-10 DIAGNOSIS — Z7982 Long term (current) use of aspirin: Secondary | ICD-10-CM | POA: Diagnosis not present

## 2015-08-10 DIAGNOSIS — M1990 Unspecified osteoarthritis, unspecified site: Secondary | ICD-10-CM | POA: Diagnosis present

## 2015-08-10 DIAGNOSIS — N390 Urinary tract infection, site not specified: Secondary | ICD-10-CM | POA: Diagnosis present

## 2015-08-10 DIAGNOSIS — Z9889 Other specified postprocedural states: Secondary | ICD-10-CM

## 2015-08-10 DIAGNOSIS — F329 Major depressive disorder, single episode, unspecified: Secondary | ICD-10-CM | POA: Diagnosis not present

## 2015-08-10 DIAGNOSIS — Z8673 Personal history of transient ischemic attack (TIA), and cerebral infarction without residual deficits: Secondary | ICD-10-CM | POA: Insufficient documentation

## 2015-08-10 DIAGNOSIS — Z8669 Personal history of other diseases of the nervous system and sense organs: Secondary | ICD-10-CM | POA: Diagnosis not present

## 2015-08-10 DIAGNOSIS — R441 Visual hallucinations: Secondary | ICD-10-CM | POA: Diagnosis not present

## 2015-08-10 DIAGNOSIS — K219 Gastro-esophageal reflux disease without esophagitis: Secondary | ICD-10-CM | POA: Diagnosis present

## 2015-08-10 DIAGNOSIS — R9089 Other abnormal findings on diagnostic imaging of central nervous system: Secondary | ICD-10-CM | POA: Diagnosis present

## 2015-08-10 DIAGNOSIS — E538 Deficiency of other specified B group vitamins: Secondary | ICD-10-CM | POA: Diagnosis present

## 2015-08-10 DIAGNOSIS — Z818 Family history of other mental and behavioral disorders: Secondary | ICD-10-CM | POA: Diagnosis not present

## 2015-08-10 DIAGNOSIS — F259 Schizoaffective disorder, unspecified: Secondary | ICD-10-CM | POA: Diagnosis present

## 2015-08-10 DIAGNOSIS — R410 Disorientation, unspecified: Secondary | ICD-10-CM | POA: Diagnosis present

## 2015-08-10 LAB — SALICYLATE LEVEL: Salicylate Lvl: <4 mg/dL (ref 2.8–30.0)

## 2015-08-10 LAB — LIPID PANEL
Cholesterol: 108 mg/dL (ref 0–200)
HDL: 42 mg/dL (ref >40)
LDL CALC: 48 mg/dL (ref 0–99)
TRIGLYCERIDES: 92 mg/dL (ref <150)
Total CHOL/HDL Ratio: 2.6 RATIO
VLDL: 18 mg/dL (ref 0–40)

## 2015-08-10 LAB — TSH: TSH: 1.058 u[IU]/mL (ref 0.350–4.500)

## 2015-08-10 LAB — VALPROIC ACID LEVEL
VALPROIC ACID LVL: 64 ug/mL (ref 50.0–100.0)
VALPROIC ACID LVL: 92 ug/mL (ref 50.0–100.0)

## 2015-08-10 LAB — CBC
HEMATOCRIT: 38.8 % (ref 35.0–47.0)
HEMOGLOBIN: 13.1 g/dL (ref 12.0–16.0)
MCH: 28.3 pg (ref 26.0–34.0)
MCHC: 33.7 g/dL (ref 32.0–36.0)
MCV: 84 fL (ref 80.0–100.0)
Platelets: 132 10*3/uL — ABNORMAL LOW (ref 150–440)
RBC: 4.62 MIL/uL (ref 3.80–5.20)
RDW: 14.6 % — AB (ref 11.5–14.5)
WBC: 7.8 10*3/uL (ref 3.6–11.0)

## 2015-08-10 LAB — AMMONIA
AMMONIA: 13 umol/L (ref 9–35)
Ammonia: 21 umol/L (ref 9–35)

## 2015-08-10 LAB — COMPREHENSIVE METABOLIC PANEL
ALBUMIN: 3.8 g/dL (ref 3.5–5.0)
ALT: 20 U/L (ref 14–54)
AST: 27 U/L (ref 15–41)
Alkaline Phosphatase: 63 U/L (ref 38–126)
Anion gap: 13 (ref 5–15)
BUN: 13 mg/dL (ref 6–20)
CHLORIDE: 101 mmol/L (ref 101–111)
CO2: 25 mmol/L (ref 22–32)
CREATININE: 0.97 mg/dL (ref 0.44–1.00)
Calcium: 9.1 mg/dL (ref 8.9–10.3)
GFR calc Af Amer: >60 mL/min (ref >60)
GFR calc non Af Amer: >60 mL/min (ref >60)
Glucose, Bld: 119 mg/dL — ABNORMAL HIGH (ref 65–99)
POTASSIUM: 2.8 mmol/L — AB (ref 3.5–5.1)
Sodium: 139 mmol/L (ref 135–145)
Total Bilirubin: 0.7 mg/dL (ref 0.3–1.2)
Total Protein: 7.7 g/dL (ref 6.5–8.1)

## 2015-08-10 LAB — HEMOGLOBIN A1C: Hgb A1c MFr Bld: 6.1 % — ABNORMAL HIGH (ref 4.0–6.0)

## 2015-08-10 LAB — ETHANOL: Alcohol, Ethyl (B): <5 mg/dL (ref <5)

## 2015-08-10 LAB — POTASSIUM
POTASSIUM: 3.1 mmol/L — AB (ref 3.5–5.1)
Potassium: 3.5 mmol/L (ref 3.5–5.1)

## 2015-08-10 LAB — ACETAMINOPHEN LEVEL

## 2015-08-10 MED ORDER — CARVEDILOL 25 MG PO TABS
25.0000 mg | ORAL_TABLET | Freq: Two times a day (BID) | ORAL | Status: DC
  Administered 2015-08-10 – 2015-08-18 (×15): 25 mg via ORAL
  Filled 2015-08-10 (×15): qty 1

## 2015-08-10 MED ORDER — CITALOPRAM HYDROBROMIDE 20 MG PO TABS: 40.0000 mg | ORAL_TABLET | Freq: Every day | ORAL | Status: DC

## 2015-08-10 MED ORDER — CITALOPRAM HYDROBROMIDE 20 MG PO TABS
40.0000 mg | ORAL_TABLET | Freq: Every day | ORAL | Status: DC
  Administered 2015-08-11 – 2015-08-18 (×8): 40 mg via ORAL
  Filled 2015-08-10 (×8): qty 2

## 2015-08-10 MED ORDER — AMLODIPINE BESYLATE 5 MG PO TABS
ORAL_TABLET | ORAL | Status: AC
  Filled 2015-08-10: qty 2

## 2015-08-10 MED ORDER — DIVALPROEX SODIUM 500 MG PO DR TAB
500.0000 mg | DELAYED_RELEASE_TABLET | Freq: Three times a day (TID) | ORAL | Status: DC
  Administered 2015-08-10 (×2): 500 mg via ORAL
  Filled 2015-08-10 (×2): qty 1

## 2015-08-10 MED ORDER — ASPIRIN EC 81 MG PO TBEC
DELAYED_RELEASE_TABLET | ORAL | Status: AC
  Administered 2015-08-10: 81 mg
  Filled 2015-08-10: qty 1

## 2015-08-10 MED ORDER — AMLODIPINE BESYLATE 5 MG PO TABS
10.0000 mg | ORAL_TABLET | Freq: Every day | ORAL | Status: DC
  Administered 2015-08-11 – 2015-08-18 (×8): 10 mg via ORAL
  Filled 2015-08-10 (×8): qty 2

## 2015-08-10 MED ORDER — ALPRAZOLAM 0.5 MG PO TABS: 1.0000 mg | ORAL_TABLET | Freq: Three times a day (TID) | ORAL | Status: DC | PRN

## 2015-08-10 MED ORDER — AMLODIPINE BESYLATE 5 MG PO TABS: 10.0000 mg | ORAL_TABLET | Freq: Every day | ORAL | Status: DC

## 2015-08-10 MED ORDER — ACETAMINOPHEN 325 MG PO TABS: 650.0000 mg | ORAL_TABLET | Freq: Four times a day (QID) | ORAL | Status: DC | PRN

## 2015-08-10 MED ORDER — ALPRAZOLAM 1 MG PO TABS
1.0000 mg | ORAL_TABLET | Freq: Three times a day (TID) | ORAL | Status: DC | PRN
  Filled 2015-08-10: qty 1

## 2015-08-10 MED ORDER — LAMOTRIGINE 25 MG PO TABS
25.0000 mg | ORAL_TABLET | Freq: Every day | ORAL | Status: DC
  Administered 2015-08-10 – 2015-08-18 (×9): 25 mg via ORAL
  Filled 2015-08-10 (×9): qty 1

## 2015-08-10 MED ORDER — CITALOPRAM HYDROBROMIDE 20 MG PO TABS
ORAL_TABLET | ORAL | Status: AC
  Administered 2015-08-10: 40 mg
  Filled 2015-08-10: qty 2

## 2015-08-10 MED ORDER — CLONIDINE HCL 0.1 MG PO TABS
ORAL_TABLET | ORAL | Status: AC
  Filled 2015-08-10: qty 1

## 2015-08-10 MED ORDER — CLONIDINE HCL 0.1 MG PO TABS
0.1000 mg | ORAL_TABLET | Freq: Two times a day (BID) | ORAL | Status: DC
  Administered 2015-08-10 – 2015-08-18 (×16): 0.1 mg via ORAL
  Filled 2015-08-10 (×16): qty 1

## 2015-08-10 MED ORDER — LAMOTRIGINE 25 MG PO TABS: 25.0000 mg | ORAL_TABLET | Freq: Every day | ORAL | Status: DC

## 2015-08-10 MED ORDER — POTASSIUM CHLORIDE CRYS ER 10 MEQ PO TBCR
40.0000 meq | EXTENDED_RELEASE_TABLET | Freq: Two times a day (BID) | ORAL | Status: DC
  Administered 2015-08-10 – 2015-08-18 (×15): 40 meq via ORAL
  Filled 2015-08-10 (×15): qty 4

## 2015-08-10 MED ORDER — ASPIRIN EC 81 MG PO TBEC
81.0000 mg | DELAYED_RELEASE_TABLET | Freq: Every day | ORAL | Status: DC
  Administered 2015-08-11 – 2015-08-18 (×8): 81 mg via ORAL
  Filled 2015-08-10 (×8): qty 1

## 2015-08-10 MED ORDER — POTASSIUM CHLORIDE CRYS ER 20 MEQ PO TBCR
40.0000 meq | EXTENDED_RELEASE_TABLET | Freq: Two times a day (BID) | ORAL | Status: DC
  Administered 2015-08-10: 40 meq via ORAL
  Filled 2015-08-10: qty 2

## 2015-08-10 MED ORDER — ASPIRIN EC 81 MG PO TBEC: 81.0000 mg | DELAYED_RELEASE_TABLET | Freq: Every day | ORAL | Status: DC

## 2015-08-10 MED ORDER — CLONIDINE HCL 0.1 MG PO TABS: 0.1000 mg | ORAL_TABLET | Freq: Two times a day (BID) | ORAL | Status: DC

## 2015-08-10 MED ORDER — PANTOPRAZOLE SODIUM 40 MG PO TBEC
40.0000 mg | DELAYED_RELEASE_TABLET | Freq: Every day | ORAL | Status: DC
  Administered 2015-08-11 – 2015-08-18 (×8): 40 mg via ORAL
  Filled 2015-08-10 (×8): qty 1

## 2015-08-10 MED ORDER — PANTOPRAZOLE SODIUM 40 MG PO TBEC: 40.0000 mg | DELAYED_RELEASE_TABLET | Freq: Every day | ORAL | Status: DC

## 2015-08-10 MED ORDER — CARVEDILOL 25 MG PO TABS
ORAL_TABLET | ORAL | Status: AC
  Administered 2015-08-10: 25 mg
  Filled 2015-08-10: qty 1

## 2015-08-10 MED ORDER — ALUM & MAG HYDROXIDE-SIMETH 200-200-20 MG/5ML PO SUSP: 30.0000 mL | ORAL | Status: DC | PRN

## 2015-08-10 MED ORDER — PANTOPRAZOLE SODIUM 40 MG PO TBEC
DELAYED_RELEASE_TABLET | ORAL | Status: AC
  Administered 2015-08-10: 40 mg
  Filled 2015-08-10: qty 1

## 2015-08-10 MED ORDER — MAGNESIUM HYDROXIDE 400 MG/5ML PO SUSP: 30.0000 mL | Freq: Every day | ORAL | Status: DC | PRN

## 2015-08-10 MED ORDER — CARVEDILOL 25 MG PO TABS: 25.0000 mg | ORAL_TABLET | Freq: Two times a day (BID) | ORAL | Status: DC

## 2015-08-10 NOTE — BH Assessment (Signed)
Assessment Note  Sandra Charles is an 60 y.o. female presenting to the ED under IVC, initiated by her son, for paranoid behavior and not taking her medications.  Due to her altered mental state, patient was unable to completely participate in assessment.  Information for assessment was obtained from collateral contact with patient's son, Kyrstal Bebee [id number]) and niece, Glenna Fellows [phone]).  According to patient's son, pt has a prior diagnosis of schizophrenia, bipolar type and anxiety disorder.  He says that she was prescribed Seroquel and Xanax, prn.  Per Mr. Sorola report, pt was prescribed Depakote but was gradually being weaned from it.  Mr. Harper further reports that pt was being treated for a UTI but states that pt stopped taking the antibiotics because they were causing stomach pain.  Mr. Tirpak states that pt has been having auditory hallucinations and thinks that people on the radio are talking to her.  He says that she has been making statements about shooting her husband in the head.  Mr. Jammal states that patient dies own a gun but it has no bullets in it and pt does not know where the gun has been hid.  Mr. Boch reports that pt still thinks she works even though she hasn't worked in over a year.  He reports that pt often thinks that she hasn't eaten in days, even though she has.  He states that pt has not bathed in 5 days.    Diagnosis: Paranoia  Past Medical History:  Past Medical History  Diagnosis Date  . Hypertension   . Anxiety   . Depression   . Mini stroke ([place])     "several since 05/2014" (07/15/2015)  . Hyperlipidemia   . GERD (gastroesophageal reflux disease)   . Headache     "weekly" (07/15/2015)  . Seizures ([place]) dx'd 04/2015  . Arthritis     "all over"  . Chronic lower back pain     Past Surgical History  Procedure Laterality Date  . Vaginal hysterectomy    . Tubal ligation    . Dilation and curettage of uterus    . Lumbar puncture  07/14/2015     Family History:  Family History  Problem Relation Age of Onset  . Hyperlipidemia Mother   . Breast cancer Mother 70  . Hypertension Mother   . Heart attack Mother     age 45's  . Breast cancer Sister 23  . Hyperlipidemia Father   . Hypertension Father   . Heart attack Father 43    Social History:  reports that she has quit smoking. Her smoking use included Cigarettes. She has a 5 pack-year smoking history. She has never used smokeless tobacco. She reports that she drinks alcohol. She reports that she does not use illicit drugs.  Additional Social History:  Alcohol / Drug Use History of alcohol / drug use?: No history of alcohol / drug abuse  CIWA:   COWS:    Allergies:  Allergies  Allergen Reactions  . Lovastatin Other (See Comments)  . Tramadol Other (See Comments)    Home Medications:  (Not in a hospital admission)  OB/GYN Status:  No LMP recorded. Patient has had a hysterectomy.  General Assessment Data Location of Assessment: [facility] ED TTS Assessment: In system Is this a Tele or Face-to-Face Assessment?: Face-to-Face Is this an Initial Assessment or a Re-assessment for this encounter?: Initial Assessment Marital status: Married [place] name: Ermel Is patient pregnant?: No Pregnancy Status: No Living Arrangements: Spouse/significant other Can  pt return to current living arrangement?: Yes Admission Status: Involuntary Is patient capable of signing voluntary admission?: No Referral Source: Self/Family/Friend Insurance type: Medicaid  Medical Screening Exam ([place]) Medical Exam completed: Yes  Crisis Care Plan Living Arrangements: Spouse/significant other Legal Guardian: Other: (self) Name of Psychiatrist: Dr. Rosine Door Name of Therapist: [facility]  Education Status Is patient currently in school?: No Current Grade: N/A Highest grade of school patient has completed: N/A Name of school: N/A Contact person: N/A  Risk to self with the  past 6 months Suicidal Ideation: No Has patient been a risk to self within the past 6 months prior to admission? : No Suicidal Intent: No Has patient had any suicidal intent within the past 6 months prior to admission? : No Is patient at risk for suicide?: No Suicidal Plan?: No Has patient had any suicidal plan within the past 6 months prior to admission? : No Access to Means: No What has been your use of drugs/alcohol within the last 12 months?: None reported Previous Attempts/Gestures: No How many times?: 0 Other Self Harm Risks: None identified Triggers for Past Attempts: None known Intentional Self Injurious Behavior: None Family Suicide History: No Recent stressful life event(s): Other (Comment) Persecutory voices/beliefs?: No Depression: Yes Depression Symptoms: Loss of interest in usual pleasures Substance abuse history and/or treatment for substance abuse?: No Suicide prevention information given to non-admitted patients: Not applicable  Risk to Others within the past 6 months Homicidal Ideation: No Does patient have any lifetime risk of violence toward others beyond the six months prior to admission? : No Thoughts of Harm to Others: No Current Homicidal Intent: No Current Homicidal Plan: No Access to Homicidal Means: No Identified Victim: None identified History of harm to others?: No Assessment of Violence: None Noted Violent Behavior Description: None identified Does patient have access to weapons?: No Criminal Charges Pending?: No Does patient have a court date: No Is patient on probation?: No  Psychosis Hallucinations: Auditory Delusions: None noted  Mental Status Report Appearance/Hygiene: In scrubs Eye Contact: Poor Motor Activity: Freedom of movement Speech: Incoherent Level of Consciousness: Sleeping Mood: Other (Comment) Affect: Inconsistent with thought content Anxiety Level: Minimal Thought Processes: Unable to Assess Judgement: Unable to  Assess Orientation: Not oriented Obsessive Compulsive Thoughts/Behaviors: Minimal  Cognitive Functioning Concentration: Unable to Assess Memory: Unable to Assess IQ: Average Insight: Unable to Assess Impulse Control: Unable to Assess Appetite: Poor Weight Loss: 0 Weight Gain: 0 Sleep: Unable to Assess Vegetative Symptoms: Not bathing, Staying in bed, Decreased grooming (Pt's son reports that pt has not bathed in 5 days)  ADLScreening [facility] Assessment Services) Patient's cognitive ability adequate to safely complete daily activities?: Yes Patient able to express need for assistance with ADLs?: Yes Independently performs ADLs?: No  Prior Inpatient Therapy Prior Inpatient Therapy: No Prior Therapy Dates: N/A Prior Therapy Facilty/Provider(s): N/A Reason for Treatment: N/A  Prior Outpatient Therapy Prior Outpatient Therapy: Yes Prior Therapy Dates: current Prior Therapy Facilty/Provider(s): [facility] Reason for Treatment: anxiety Does patient have an ACCT team?: No Does patient have Intensive In-House Services?  : No Does patient have [facility] services? : No Does patient have P4CC services?: No  ADL Screening (condition at time of admission) Patient's cognitive ability adequate to safely complete daily activities?: Yes Patient able to express need for assistance with ADLs?: Yes Independently performs ADLs?: No       Abuse/Neglect Assessment (Assessment to be complete while patient is alone) Physical Abuse: Denies Verbal Abuse: Denies Sexual Abuse:  Denies Exploitation of patient/patient's resources: Denies Self-Neglect: Denies Values / Beliefs Cultural Requests During Hospitalization: None Spiritual Requests During Hospitalization: None Consults Spiritual Care Consult Needed: No Social Work Consult Needed: No      Additional Information 1:1 In Past 12 Months?: No CIRT Risk: No Elopement Risk: No Does patient have medical clearance?: No      Disposition:  Disposition Initial Assessment Completed for this Encounter: Yes Disposition of Patient: Other dispositions Other disposition(s): Other (Comment) (pending)  On Site Evaluation by:   Reviewed with Physician:    Sharnell Knight C Valene Villa 08/10/2015 3:07 AM

## 2015-08-10 NOTE — Consult Note (Signed)
  Brief Note: TTS reviewed. Labs reviewed.. Patient appropriate for inpatient treatment. Orders done including recheck of potassium. Full note to follow.

## 2015-08-10 NOTE — Tx Team (Signed)
Initial Interdisciplinary Treatment Plan   PATIENT STRESSORS: Medication change or noncompliance Substance abuse   PATIENT STRENGTHS: Capable of independent living Physical Health   PROBLEM LIST: Problem List/Patient Goals Date to be addressed Date deferred Reason deferred Estimated date of resolution  Schizophrenia M 6/14           Marijuana abuse  6/14           Med non compliance  6/14                              DISCHARGE CRITERIA:  Adequate post-discharge living arrangements Improved stabilization in mood, thinking, and/or behavior  PRELIMINARY DISCHARGE PLAN: Attend aftercare/continuing care group Return to previous living arrangement  PATIENT/FAMIILY INVOLVEMENT: This treatment plan has been presented to and reviewed with the patient, Sandra Charles, and/or family member, .  The patient and family have been given the opportunity to ask questions and make suggestions.  Raul Del 08/10/2015, 3:44 PM

## 2015-08-10 NOTE — ED Notes (Signed)
Lunch tray given. 

## 2015-08-10 NOTE — ED Notes (Signed)
Pt up to bathroom and attempt to urinate; unable to do so at this time. Pt back in bed.

## 2015-08-10 NOTE — Consult Note (Signed)
[facility] Face-to-Face Psychiatry Consult   Reason for Consult:  Consult for this 60 year old woman with a past history of depression as well as a more recent workup for a still obscure neurologic condition. Brought in by family because of worsening behavior and mood problems. Referring Physician:  Joni Fears Patient Identification: Sandra Charles MRN:  [id number] Principal Diagnosis: Depression, major, recurrent, severe with psychosis ([place]) Diagnosis:   Patient Active Problem List   Diagnosis Date Noted  . Depression, major, recurrent, severe with psychosis ([place]) [F33.3] 08/10/2015  . Seizure disorder ([place]) [Q46.962] 08/10/2015  . Cannabis abuse [F12.10] 08/10/2015  . Seizures ([place]) [R56.9]   . Abnormal finding on MRI of brain [R93.0]   . Hypokalemia [E87.6] 07/15/2015  . Encephalitis [G04.90] 07/14/2015  . Confusion [R41.0]   . Altered mental status [R41.82] 05/14/2015  . GERD (gastroesophageal reflux disease) [K21.9] 11/25/2014  . Essential hypertension [I10] 11/25/2014    Total Time spent with patient: 1 hour  Subjective:   Sandra Charles is a 60 y.o. female patient admitted with "I'm not feeling well".  HPI:  Patient interviewed. I also spoke with the patient's niece at some length. I reviewed her chart including reviewing a lot of the notes over the last several months here and Dr. Lannie Fields notes. This 60 year old woman recently has been acting even more abnormal according to the family. They say that she has very little motivation and doesn't do things they think her memory is really bad. They note that she has at times seemed to be having auditory hallucinations. Not eating well poor self-care. Extreme fatigue. Niece in particular is concerned about how it relates to her medicine. On interview the patient says that she is not feeling well but has a hard time describing what that means. She says that she sleeps normally at night but has no appetite and has been losing weight. She denies having  any current suicidal ideation. She says that it's possible that she has auditory hallucinations and she is not sure about it. She has a hard time describing anything positive in her life saying that seeing her granddaughter is the only thing positive. She does very little just sitting around the house watching TV during the day. Seems to be mentally slow. She says that she has been compliant with the medicine she is being given. That she still uses marijuana at times. Denies any alcohol abuse. The patient has recently been having a workup for about the last month for a possible seizure disorder. As near as I can tell the events were that at some point a few months ago she had an episode that was evaluated in which the family reported that she had chewed on her tong and there was altered mental status. Possibility of seizure was raised. She subsequently had other episodes of altered mental status. As near as I can tell however and from speaking with the family, no one has actually witnessed her having anything that definitely looks like a motor seizure . all of the EEGs are either normal or only show diffuse slowing and no specific epileptic activity. One objective finding was a recent MRI scan which was interpreted as showing some enhancement and swelling in the hippocampus possibly related to status epilepticus. She has been treated with Depakote for probably a month but during that time the family thinks she has just been more sluggish and down. They don't see any improvement in her mental state. She is seeing Dr. Melrose Nakayama for neurologic follow-up. He has  recently initiated a gradual change over to lamotrigine.  Medical history: Patient has a history of high blood pressure which appears to of been poorly controlled for quite some time. This MRI scan suggested she may have had lacunar infarcts in the past. Otherwise the patient has this new possible diagnosis of seizures which still seems a little  confusing.  Social history: Lives with her boyfriend. Doesn't work outside the home. Says that she hasn't worked in about a year. Has a son and a niece who appeared to be the relatives closest to her.  Substance abuse history: Admits to ongoing marijuana use. There is some mention about possible cocaine use and some older Notes but it is not on the recent drug screens. Denies ongoing alcohol abuse.  Past Psychiatric History: Patient has a past history of depression. We don't have any clear notes relating to it but she has been on antidepressant medicine and followed by Dr. Rosine Door apparently 4 years. She does have at least one past psychiatric hospitalization probably at [facility]. It's unclear whether she has ever tried to harm herself in the past but denies any current suicidal intent or ideation. Family thinks that she is having frequent auditory hallucinations the patient's insight about this as poor. No known history of violence. It looks like she was being treated mostly with citalopram for quite some time.  Risk to Self: Suicidal Ideation: No Suicidal Intent: No Is patient at risk for suicide?: No Suicidal Plan?: No Access to Means: No What has been your use of drugs/alcohol within the last 12 months?: None reported How many times?: 0 Other Self Harm Risks: None identified Triggers for Past Attempts: None known Intentional Self Injurious Behavior: None Risk to Others: Homicidal Ideation: No Thoughts of Harm to Others: No Current Homicidal Intent: No Current Homicidal Plan: No Access to Homicidal Means: No Identified Victim: None identified History of harm to others?: No Assessment of Violence: None Noted Violent Behavior Description: None identified Does patient have access to weapons?: No Criminal Charges Pending?: No Does patient have a court date: No Prior Inpatient Therapy: Prior Inpatient Therapy: No Prior Therapy Dates: N/A Prior Therapy Facilty/Provider(s): N/A Reason for  Treatment: N/A Prior Outpatient Therapy: Prior Outpatient Therapy: Yes Prior Therapy Dates: current Prior Therapy Facilty/Provider(s): [facility] Reason for Treatment: anxiety Does patient have an ACCT team?: No Does patient have Intensive In-House Services?  : No Does patient have [facility] services? : No Does patient have P4CC services?: No  Past Medical History:  Past Medical History  Diagnosis Date  . Hypertension   . Anxiety   . Depression   . Mini stroke ([place])     "several since 05/2014" (07/15/2015)  . Hyperlipidemia   . GERD (gastroesophageal reflux disease)   . Headache     "weekly" (07/15/2015)  . Seizures ([place]) dx'd 04/2015  . Arthritis     "all over"  . Chronic lower back pain     Past Surgical History  Procedure Laterality Date  . Vaginal hysterectomy    . Tubal ligation    . Dilation and curettage of uterus    . Lumbar puncture  07/14/2015   Family History:  Family History  Problem Relation Age of Onset  . Hyperlipidemia Mother   . Breast cancer Mother 109  . Hypertension Mother   . Heart attack Mother     age 42's  . Breast cancer Sister 8  . Hyperlipidemia Father   . Hypertension Father   . Heart attack Father  75   Family Psychiatric  History: She states that her mother had depression. No other known family history Social History:  History  Alcohol Use  . 0.0 oz/week  . 0 Standard drinks or equivalent per week    Comment: 07/15/2015 "nothing in the last few years"     History  Drug Use No    Social History   Social History  . Marital Status: Single    Spouse Name: N/A  . Number of Children: N/A  . Years of Education: N/A   Occupational History  . disabled    Social History Main Topics  . Smoking status: Former Smoker -- 1.00 packs/day for 5 years    Types: Cigarettes  . Smokeless tobacco: Never Used     Comment: "quit smoking cigarettes in the early 2000s"  . Alcohol Use: 0.0 oz/week    0 Standard drinks or equivalent per week      Comment: 07/15/2015 "nothing in the last few years"  . Drug Use: No  . Sexual Activity: Not on file   Other Topics Concern  . Not on file   Social History Narrative   Additional Social History:    Allergies:   Allergies  Allergen Reactions  . Lovastatin Other (See Comments)  . Tramadol Other (See Comments)    Labs:  Results for orders placed or performed during the hospital encounter of 08/10/15 (from the past 48 hour(s))  CBC     Status: Abnormal   Collection Time: 08/10/15  1:48 AM  Result Value Ref Range   WBC 7.8 3.6 - 11.0 K/uL   RBC 4.62 3.80 - 5.20 MIL/uL   Hemoglobin 13.1 12.0 - 16.0 g/dL   HCT 38.8 35.0 - 47.0 %   MCV 84.0 80.0 - 100.0 fL   MCH 28.3 26.0 - 34.0 pg   MCHC 33.7 32.0 - 36.0 g/dL   RDW 14.6 (H) 11.5 - 14.5 %   Platelets 132 (L) 150 - 440 K/uL  Comprehensive metabolic panel     Status: Abnormal   Collection Time: 08/10/15  1:48 AM  Result Value Ref Range   Sodium 139 135 - 145 mmol/L   Potassium 2.8 (LL) 3.5 - 5.1 mmol/L    Comment: CRITICAL RESULT CALLED TO, READ BACK BY AND VERIFIED WITH ANDREA BRYANT @ 0238 ON 08/10/2015 BY CAF    Chloride 101 101 - 111 mmol/L   CO2 25 22 - 32 mmol/L   Glucose, Bld 119 (H) 65 - 99 mg/dL   BUN 13 6 - 20 mg/dL   Creatinine, Ser 0.97 0.44 - 1.00 mg/dL   Calcium 9.1 8.9 - 10.3 mg/dL   Total Protein 7.7 6.5 - 8.1 g/dL   Albumin 3.8 3.5 - 5.0 g/dL   AST 27 15 - 41 U/L   ALT 20 14 - 54 U/L   Alkaline Phosphatase 63 38 - 126 U/L   Total Bilirubin 0.7 0.3 - 1.2 mg/dL   GFR calc non Af Amer >60 >60 mL/min   GFR calc Af Amer >60 >60 mL/min    Comment: (NOTE) The eGFR has been calculated using the CKD EPI equation. This calculation has not been validated in all clinical situations. eGFR's persistently <60 mL/min signify possible Chronic Kidney Disease.    Anion gap 13 5 - 15  Ethanol     Status: None   Collection Time: 08/10/15  1:48 AM  Result Value Ref Range   Alcohol, Ethyl (B) <5 <5 mg/dL  Comment:        LOWEST DETECTABLE LIMIT FOR SERUM ALCOHOL IS 5 mg/dL FOR MEDICAL PURPOSES ONLY   Acetaminophen level     Status: Abnormal   Collection Time: 08/10/15  1:48 AM  Result Value Ref Range   Acetaminophen (Tylenol), Serum <10 (L) 10 - 30 ug/mL    Comment:        THERAPEUTIC CONCENTRATIONS VARY SIGNIFICANTLY. A RANGE OF 10-30 ug/mL MAY BE AN EFFECTIVE CONCENTRATION FOR MANY PATIENTS. HOWEVER, SOME ARE BEST TREATED AT CONCENTRATIONS OUTSIDE THIS RANGE. ACETAMINOPHEN CONCENTRATIONS >150 ug/mL AT 4 HOURS AFTER INGESTION AND >50 ug/mL AT 12 HOURS AFTER INGESTION ARE OFTEN ASSOCIATED WITH TOXIC REACTIONS.   Salicylate level     Status: None   Collection Time: 08/10/15  1:48 AM  Result Value Ref Range   Salicylate Lvl <1.0 2.8 - 30.0 mg/dL  Potassium     Status: Abnormal   Collection Time: 08/10/15  9:28 AM  Result Value Ref Range   Potassium 3.1 (L) 3.5 - 5.1 mmol/L  Valproic acid level     Status: None   Collection Time: 08/10/15  9:28 AM  Result Value Ref Range   Valproic Acid Lvl 64 50.0 - 100.0 ug/mL  Ammonia     Status: None   Collection Time: 08/10/15  9:28 AM  Result Value Ref Range   Ammonia 13 9 - 35 umol/L    Current Facility-Administered Medications  Medication Dose Route Frequency Provider Last Rate Last Dose  . lamoTRIgine (LAMICTAL) tablet 25 mg  25 mg Oral Daily Gonzella Lex, MD       No current outpatient prescriptions on file.    Musculoskeletal: Strength & Muscle Tone: within normal limits Gait & Station: normal Patient leans: N/A  Psychiatric Specialty Exam: Physical Exam  Constitutional: She appears well-developed and well-nourished.  HENT:  Head: Normocephalic and atraumatic.  Eyes: Conjunctivae are normal. Pupils are equal, round, and reactive to light.  Neck: Normal range of motion.  Cardiovascular: Normal heart sounds.   Respiratory: Effort normal.  GI: Soft.  Musculoskeletal: Normal range of motion.  Neurological: She is  alert.  Skin: Skin is warm and dry.  Psychiatric: Her mood appears anxious. Her affect is blunt. Her speech is delayed. She is slowed. Cognition and memory are impaired. She expresses no suicidal ideation.    Review of Systems  Constitutional: Positive for weight loss and malaise/fatigue.  HENT: Negative.   Eyes: Negative.   Respiratory: Negative.   Cardiovascular: Negative.   Gastrointestinal: Negative.   Musculoskeletal: Negative.   Skin: Negative.   Neurological: Positive for weakness.  Psychiatric/Behavioral: Positive for depression, hallucinations, memory loss and substance abuse. Negative for suicidal ideas. The patient is nervous/anxious.     Blood pressure 111/63, pulse 73, temperature 98 F (36.7 C), temperature source Oral, resp. rate 18, height '5\' 1"'  (1.549 m), weight 66.225 kg (146 lb), SpO2 98 %.Body mass index is 27.6 kg/(m^2).  General Appearance: Fairly Groomed  Eye Contact:  Minimal  Speech:  Slow  Volume:  Decreased  Mood:  Dysphoric  Affect:  Flat  Thought Process:  Goal Directed  Orientation:  Full (Time, Place, and Person)  Thought Content:  Hallucinations: Auditory  Suicidal Thoughts:  No  Homicidal Thoughts:  No  Memory:  Immediate;   Good Recent;   Poor Remote;   Fair  Judgement:  Impaired  Insight:  Shallow  Psychomotor Activity:  Decreased  Concentration:  Concentration: Poor  Recall:  Poor  Fund of  Knowledge:  Fair  Language:  Fair  Akathisia:  No  Handed:  Right  AIMS (if indicated):     Assets:  Desire for Improvement Housing Resilience Social Support  ADL's:  Intact  Cognition:  Impaired,  Mild  Sleep:        Treatment Plan Summary: Daily contact with patient to assess and evaluate symptoms and progress in treatment, Medication management and Plan 60 year old woman with a past history of depression who presents with worsening mental state characterized by poor memory poor cognitive function lethargy lack of interaction. Patient is not  currently delirious. She is able to give some history. Shows a flat and dysphoric affect. Says she is not feeling well has nothing that she enjoys and has not been having any appetite. The recent diagnosis of seizures seems to mostly hang on the abnormal MRI scan and the episode that was described as involving tongue chewing. From what I could gather from the family there seems to still be some skepticism about the seizure diagnosis and the patient herself expresses what sounds like some skepticism. Patient will continue on Dr. Lannie Fields plan of cutting down the Depakote to 500 twice a day and starting lamotrigine. I am not starting any new psychiatric medicine at this time. Everything about the patient's mental status and interaction with me looks like a severe depression and I think this needs to be investigated further in a psychiatric setting particularly since she is not getting better with her current outpatient treatment. Patient will be admitted to the psychiatric ward. Continue current medicines for medical problems. I've ordered a Depakote level which is normal. Her ammonia level is normal. Further labs will be checked and blood pressure will be checked regularly. Family is aware of the plan. I will mention that her niece clearly stated to me that she particularly did not want the patient to be prescribed any Haldol.  Disposition: Recommend psychiatric Inpatient admission when medically cleared. Supportive therapy provided about ongoing stressors.  Alethia Berthold, MD 08/10/2015 2:25 PM

## 2015-08-10 NOTE — ED Provider Notes (Signed)
[facility] Emergency Department Provider Note   ____________________________________________  Time seen: Approximately 224 AM  I have reviewed the triage vital signs and the nursing notes.   HISTORY  Chief Complaint Manic Behavior  Patient not cooperating with history  HPI Sandra Charles is a 60 y.o. female who comes into the hospital today under involuntary commitment.When asked what brings the patient into the hospital today she reports that she does not know why she is here. I did ask her how she arrived here and she reports that she does not know. According to the IVC paperwork the patient has a history of bipolar disorder and schizophrenia and she has been having increased auditory hallucinations as well as thinking people are in the house that are not. The patient has not been taking her medications and they're concerned about her safety. According to the IVC paperwork as well the patient has threatened to put a gun to her head and shoot herself. The patient is here for psychiatric evaluation. The patient denies any chest pain and abdominal pain nausea or vomiting. She was brought by her family.   Past Medical History  Diagnosis Date  . Hypertension   . Anxiety   . Depression   . Mini stroke ([place])     "several since 05/2014" (07/15/2015)  . Hyperlipidemia   . GERD (gastroesophageal reflux disease)   . Headache     "weekly" (07/15/2015)  . Seizures ([place]) dx'd 04/2015  . Arthritis     "all over"  . Chronic lower back pain     Patient Active Problem List   Diagnosis Date Noted  . Seizures ([place])   . Abnormal finding on MRI of brain   . Hypokalemia 07/15/2015  . Encephalitis 07/14/2015  . Confusion   . Altered mental status 05/14/2015  . GERD (gastroesophageal reflux disease) 11/25/2014  . Essential hypertension 11/25/2014    Past Surgical History  Procedure Laterality Date  . Vaginal hysterectomy    . Tubal ligation    . Dilation and  curettage of uterus    . Lumbar puncture  07/14/2015    Current Outpatient Rx  Name  Route  Sig  Dispense  Refill  . ALPRAZolam (XANAX) 1 MG tablet   Oral   Take 1 mg by mouth 3 (three) times daily as needed for anxiety.         Marland Kitchen amLODipine (NORVASC) 10 MG tablet   Oral   Take 10 mg by mouth daily.         Marland Kitchen aspirin EC 81 MG tablet   Oral   Take 81 mg by mouth daily.          . carvedilol (COREG) 25 MG tablet   Oral   Take 25 mg by mouth 2 (two) times daily with a meal.         . cefUROXime (CEFTIN) 500 MG tablet   Oral   Take 1 tablet (500 mg total) by mouth 2 (two) times daily with a meal.   14 tablet   0   . Cholecalciferol (VITAMIN D3) 5000 UNITS TABS   Oral   Take 5,000 Units by mouth daily.         . citalopram (CELEXA) 40 MG tablet   Oral   Take 40 mg by mouth daily.         . cloNIDine (CATAPRES) 0.1 MG tablet   Oral   Take 1 tablet (0.1 mg total) by mouth 2 (  two) times daily.   60 tablet   11   . cyanocobalamin (,VITAMIN B-12,) 1000 MCG/ML injection   Intramuscular   Inject 1 mL (1,000 mcg total) into the muscle daily.   1 mL   0   . divalproex (DEPAKOTE) 500 MG DR tablet   Oral   Take 1 tablet (500 mg total) by mouth every 8 (eight) hours.   90 tablet   0   . HYDROcodone-acetaminophen (NORCO) 5-325 MG tablet   Oral   Take 1 tablet by mouth every 6 (six) hours as needed for moderate pain.   20 tablet   0   . pantoprazole (PROTONIX) 40 MG tablet   Oral   Take 1 tablet (40 mg total) by mouth daily.   30 tablet   11     Allergies Lovastatin and Tramadol  Family History  Problem Relation Age of Onset  . Hyperlipidemia Mother   . Breast cancer Mother 14  . Hypertension Mother   . Heart attack Mother     age 11's  . Breast cancer Sister 72  . Hyperlipidemia Father   . Hypertension Father   . Heart attack Father 67    Social History Social History  Substance Use Topics  . Smoking status: Former Smoker -- 1.00  packs/day for 5 years    Types: Cigarettes  . Smokeless tobacco: Never Used     Comment: "quit smoking cigarettes in the early 2000s"  . Alcohol Use: 0.0 oz/week    0 Standard drinks or equivalent per week     Comment: 07/15/2015 "nothing in the last few years"    Review of Systems Constitutional: No fever/chills Eyes: No visual changes. ENT: No sore throat. Cardiovascular: Denies chest pain. Respiratory: Denies shortness of breath. Gastrointestinal: No abdominal pain.  No nausea, no vomiting.  No diarrhea.  No constipation. Genitourinary: Negative for dysuria. Musculoskeletal: Negative for back pain. Skin: Negative for rash. Neurological: Negative for headaches, focal weakness or numbness. Psychiatric:Auditory and visual hallucinations  10-point ROS otherwise negative.  ____________________________________________   PHYSICAL EXAM:  VITAL SIGNS: ED Triage Vitals  Enc Vitals Group     BP 08/12/15 0740 118/70     Pulse 08/12/15 0740 88     Resp 08/12/15 0740 16     Temp 08/12/15 0740 98     Temp src --      SpO2 08/12/15 0740 96%     Weight 08/10/15 0145 146 lb (66.225 kg)     Height 08/10/15 0145 5\' 1"  (1.549 m)     Head Cir --      Peak Flow --      Pain Score 08/10/15 0145 8     Pain Loc --      Pain Edu? --      Excl. in [place]? --     Constitutional: Alert and oriented. Well appearing and in no acute distress. Eyes: Conjunctivae are normal. PERRL. EOMI. Head: Atraumatic. Nose: No congestion/rhinnorhea. Mouth/Throat: Mucous membranes are moist.  Oropharynx non-erythematous. Cardiovascular: Tachycardia regular rhythm. Grossly normal heart sounds.  Good peripheral circulation. Respiratory: Normal respiratory effort.  No retractions. Lungs CTAB. Gastrointestinal: Soft and nontender. No distention. Positive bowel sounds Musculoskeletal: No lower extremity tenderness nor edema.   Neurologic:  Normal speech and language. Skin:  Skin is warm, dry and intact.  Psychiatric:  Mood and affect are normal.   ____________________________________________   LABS (all labs ordered are listed, but only abnormal results are displayed)  Labs Reviewed  CBC - Abnormal; Notable for the following:    RDW 14.6 (*)    Platelets 132 (*)    All other components within normal limits  COMPREHENSIVE METABOLIC PANEL - Abnormal; Notable for the following:    Potassium 2.8 (*)    Glucose, Bld 119 (*)    All other components within normal limits  ACETAMINOPHEN LEVEL - Abnormal; Notable for the following:    Acetaminophen (Tylenol), Serum <10 (*)    All other components within normal limits  ETHANOL  SALICYLATE LEVEL  URINALYSIS COMPLETEWITH MICROSCOPIC ([facility] ONLY)  URINE DRUG SCREEN, QUALITATIVE ([facility] ONLY)   ____________________________________________  EKG  none ____________________________________________  RADIOLOGY  none ____________________________________________   PROCEDURES  Procedure(s) performed: None  Critical Care performed: No  ____________________________________________   INITIAL IMPRESSION / ASSESSMENT AND PLAN / ED COURSE  Pertinent labs & imaging results that were available during my care of the patient were reviewed by me and considered in my medical decision making (see chart for details).  This is a 60 year old female with a history of bipolar disorder and schizophrenia who comes into the hospital today with some visual and auditory hallucinations. The patient is also had some suicidal ideation. The patient has been involuntarily committed by her family. The patient reports he does not remember any of these occurrences and does not remember why she is here. The patient will be seen by psych. ____________________________________________   FINAL CLINICAL IMPRESSION(S) / ED DIAGNOSES  Final diagnoses:  Hallucinations  Suicidal ideation      NEW MEDICATIONS STARTED DURING THIS VISIT:  New Prescriptions   No medications on file       Note:  This document was prepared using Dragon voice recognition software and may include unintentional dictation errors.    Loney Hering, MD 08/10/15 [phone]

## 2015-08-10 NOTE — Plan of Care (Signed)
Problem: Coping: Goal: Ability to verbalize frustrations and anger appropriately will improve Outcome: Not Progressing Patient unable to verbalize frustrations at this time [facility] RN

## 2015-08-10 NOTE — ED Notes (Signed)
Pt sleeping at this time. Will given PO potassium when pt awakes and can follow commands to avoid aspiration and choking.

## 2015-08-10 NOTE — Progress Notes (Signed)
D: Patient is alert and oriented to person only on the unit this shift. Patient not attended and actively participated in groups today. Patient denies suicidal ideation, homicidal ideation, auditory or visual hallucinations at the present time.  A: Scheduled medications are administered to patient as per MD orders. Emotional support and encouragement are provided. Patient is maintained on q.15 minute safety checks. Patient is informed to notify staff with questions or concerns. R: No adverse medication reactions are noted. Patient is cooperative with medication administration and treatment plan today. Patient is anxious ,sad but  cooperative on the unit at this time. Patient does not interact    with others on the unit this shift. Patient contracts for safety at this time. Patient remains safe at this time.

## 2015-08-10 NOTE — Progress Notes (Signed)
Patient with sad affect, cooperative behavior with admission interview and assessment. No SI/HI/AVH at this time. Oriented to name only. States month is June, does not know year. Writer orients patient to hospitalization and patient confused rt situation regarding admission. Slightly anxious, no distress, no pain. VS monitored and recorded. Patient reports fall within the last month but unable to recall circumstances of fall. Placed on falls protocol and instructed how to use call bell. Skin check with no wounds or bruises. Skin check with no contraband band. Patient resting in bed at this time. Safety maintained. Dinner tray ordered.

## 2015-08-10 NOTE — ED Notes (Signed)
Pt family called and gave correct password; updated on plan of care.

## 2015-08-10 NOTE — ED Notes (Signed)
Pt does not want breakfast tray; offered cereal, refused. Pt drinking 8 oz orange juice and 6 oz milk.

## 2015-08-10 NOTE — ED Notes (Signed)
Pt in under involuntary commitment for paranoid behavior and not taking her meds.

## 2015-08-10 NOTE — ED Notes (Signed)
Pt knows that she needs to provide urine sample.

## 2015-08-10 NOTE — [facility] Group Notes (Signed)
[facility] Group Notes:  (Nursing/MHT/Case Management/Adjunct)  Date:  08/10/2015  Time:  9:50 PM  Type of Therapy:  Evening Wrap-up Group  Participation Level:  Did Not Attend  Participation Quality:  N/A  Affect:  N/A  Cognitive:  N/A  Insight:  None  Engagement in Group:  N/A  Modes of Intervention:  Discussion  Summary of Progress/Problems:  Sandra Charles 08/10/2015, 9:50 PM

## 2015-08-10 NOTE — ED Notes (Signed)
Visitor at bedside fo 15 minutes visit X 2. Monitored visit.

## 2015-08-10 NOTE — ED Notes (Signed)
[place] at bedside 

## 2015-08-10 NOTE — ED Notes (Signed)
MD at the bedside for pt evaluation  

## 2015-08-11 ENCOUNTER — Encounter: Payer: Self-pay | Admitting: Psychiatry

## 2015-08-11 LAB — PROLACTIN: Prolactin: 9.4 ng/mL (ref 4.8–23.3)

## 2015-08-11 MED ORDER — ALPRAZOLAM 0.25 MG PO TABS
0.2500 mg | ORAL_TABLET | Freq: Three times a day (TID) | ORAL | Status: DC | PRN
  Administered 2015-08-11: 0.25 mg via ORAL
  Filled 2015-08-11: qty 1

## 2015-08-11 MED ORDER — VITAMIN B-12 1000 MCG PO TABS
1000.0000 ug | ORAL_TABLET | Freq: Every day | ORAL | Status: DC
  Administered 2015-08-11 – 2015-08-18 (×8): 1000 ug via ORAL
  Filled 2015-08-11 (×8): qty 1

## 2015-08-11 MED ORDER — RISPERIDONE 1 MG PO TABS
2.0000 mg | ORAL_TABLET | Freq: Two times a day (BID) | ORAL | Status: DC
  Administered 2015-08-11 – 2015-08-18 (×15): 2 mg via ORAL
  Filled 2015-08-11 (×15): qty 2

## 2015-08-11 NOTE — [facility] Group Notes (Signed)
[facility] LCSW Group Therapy   08/11/2015 1pm   Type of Therapy: Group Therapy   Participation Level: Limited   Participation Quality: Attentive   Affect: Flat   Cognitive: Alert and Oriented   Insight: Developing/Improving and Engaged   Engagement in Therapy: Developing/Improving and Engaged   Modes of Intervention: Clarification, Confrontation, Discussion, Education, Exploration, Limit-setting, Orientation, Problem-solving, Rapport Building, Art therapist, Socialization and Support   Summary of Progress/Problems: The topic for group was feeling off balance in life. Today's group focused on defining  Feeling off balance in one's own words, identifying things that can knock one off balance, and exploring healthy ways to maintain balance in life. Group members were asked to provide an example of a time when they felt off balance, describe how they handled that situation, and process healthier ways to regain balance in the future. Group members were asked to share the most important tool for regaining balance that they learned while at [facility] and in the past and how they plan to apply this method after discharge. Pt would not respond to the CSW's prompts and questions and declined to participate in group sharing.  Pt was pother wise polite and cooperative with the CSW and other group members and presented as focused and attentive to the topics discussed and the sharing of others.  Alphonse Guild. Ladeja Pelham, LCSWA, LCAS  08/11/15

## 2015-08-11 NOTE — [facility] Group Notes (Signed)
[facility] Group Notes:  (Nursing/MHT/Case Management/Adjunct)  Date:  08/11/2015  Time:  9:11 PM  Type of Therapy:  Group Therapy  Participation Level:  Did Not Attend    Makynlie Rossini Joy Raneem Mendolia 08/11/2015, 9:11 PM

## 2015-08-11 NOTE — Progress Notes (Signed)
Patient very slow  With gait movement ,.Staff assisting her  And encourage patient  With unit participation  D: Patient stated slept good last night .Stated appetite is good and energy level  Is normal. Stated concentration is good . Stated on Depression scale  0, hopeless 0 and anxiety 0 .( low 0-10 high) Denies suicidal  homicidal ideations  .  No auditory hallucinations  No pain concerns . Appropriate ADL'S. Limited Interacting with peers and staff.  A: Encourage patient participation with unit programming . Instruction  Given on  Medication , verbalize understanding. R: Voice no other concerns. Staff continue to monitor

## 2015-08-11 NOTE — [facility] Group Notes (Signed)
[facility] Group Notes:  (Nursing/MHT/Case Management/Adjunct)  Date:  08/11/2015  Time:  3:57 PM  Type of Therapy:  Movement Therapy  Participation Level:  Did Not Attend  Nikeshia Keetch De'Chelle Arliene Rosenow 08/11/2015, 3:57 PM

## 2015-08-11 NOTE — Progress Notes (Signed)
Recreation Therapy Notes  Date: 06.15.17 Time: 9:30 am Location: Craft Room  Group Topic: Leisure Education  Goal Area(s) Addresses:  Patient will identify activities for each letter of the alphabet. Patient will verbalize ability to integrate positive leisure into life post d/c. Patient will verbalize ability to use leisure as a Technical sales engineer.  Behavioral Response: Arrived late, Attentive, Interactive  Intervention: Leisure Alphabet  Activity: Patients were given a Leisure Air traffic controller and instructed to identify leisure activities for each letter of the alphabet.  Education: LRT educated patients on what they need to participate in leisure.  Education Outcome: Acknowledges education/In group clarification offered   Clinical Observations/Feedback: Patient arrived to group at approximately 10:05 am. LRT explained activity. Patient wrote down some leisure activities. Patient contributed to group discussion by stating some healthy leisure activities.  Leonette Monarch, LRT/CTRS 08/11/2015 10:26 AM

## 2015-08-11 NOTE — [facility] Suicide Risk Assessment (Signed)
[facility] Admission Suicide Risk Assessment   Nursing information obtained from:    Demographic factors:    Current Mental Status:    Loss Factors:    Historical Factors:    Risk Reduction Factors:     Total Time spent with patient: 1 hour Principal Problem: Schizoaffective disorder, bipolar type ([place]) Diagnosis:   Patient Active Problem List   Diagnosis Date Noted  . Seizure disorder ([place]) [id number] 08/10/2015  . Cannabis use disorder, moderate, dependence ([place]) [F12.20] 08/10/2015  . Schizoaffective disorder, bipolar type ([place]) [F25.0] 08/10/2015  . Abnormal finding on MRI of brain [R93.0]   . Hypokalemia [E87.6] 07/15/2015  . Confusion [R41.0]   . GERD (gastroesophageal reflux disease) [K21.9] 11/25/2014  . Essential hypertension [I10] 11/25/2014   Subjective Data: The patient unable to state.  Continued Clinical Symptoms:  Alcohol Use Disorder Identification Test Final Score (AUDIT): 0 The "Alcohol Use Disorders Identification Test", Guidelines for Use in Primary Care, Second Edition.  World Pharmacologist [facility]). Score between 0-7:  no or low risk or alcohol related problems. Score between 8-15:  moderate risk of alcohol related problems. Score between 16-19:  high risk of alcohol related problems. Score 20 or above:  warrants further diagnostic evaluation for alcohol dependence and treatment.   CLINICAL FACTORS:   Bipolar Disorder:   Mixed State Alcohol/Substance Abuse/Dependencies Schizophrenia:   Depressive state Currently Psychotic Medical Diagnoses and Treatments/Surgeries   Musculoskeletal: Strength & Muscle Tone: within normal limits Gait & Station: normal Patient leans: N/A  Psychiatric Specialty Exam: Physical Exam  Nursing note and vitals reviewed.   Review of Systems  Psychiatric/Behavioral: Positive for hallucinations and substance abuse.  All other systems reviewed and are negative.   Blood pressure 141/71, pulse 73, temperature 98.5 F (36.9  C), temperature source Oral, resp. rate 20, height 5\' 1"  (1.549 m), weight 64.864 kg (143 lb), SpO2 98 %.Body mass index is 27.03 kg/(m^2).  General Appearance: Fairly Groomed  Eye Contact:  Minimal  Speech:  Slow  Volume:  Decreased  Mood:  Anxious  Affect:  Blunt  Thought Process:  Linear  Orientation:  Full (Time, Place, and Person)  Thought Content:  Hallucinations: Auditory  Suicidal Thoughts:  Yes.  with intent/plan  Homicidal Thoughts:  Yes.  with intent/plan  Memory:  Immediate;   Poor Recent;   Poor Remote;   Poor  Judgement:  Poor  Insight:  Lacking  Psychomotor Activity:  Decreased  Concentration:  Concentration: Poor and Attention Span: Poor  Recall:  Poor  Fund of Knowledge:  Fair  Language:  Fair  Akathisia:  No  Handed:  Right  AIMS (if indicated):     Assets:  Agricultural consultant Housing Resilience Social Support  ADL's:  Intact  Cognition:  WNL  Sleep:  Number of Hours: 8.45      COGNITIVE FEATURES THAT CONTRIBUTE TO RISK:  None    SUICIDE RISK:   Moderate:  Frequent suicidal ideation with limited intensity, and duration, some specificity in terms of plans, no associated intent, good self-control, limited dysphoria/symptomatology, some risk factors present, and identifiable protective factors, including available and accessible social support.  PLAN OF CARE: Hospital admission, medication management, substance abuse counseling, discharge planning.  Ms. Duchow is a 60 year old female with a history of schizoaffective disorder  onset neurological problems related to the hospital for auditory hallucinations, paranoia, confusion, and bizarre behaviors disappointed he is in the context of treatment noncompliance with her Depakote level is good.   1. Suicidal and  homicidal ideation: According to the chart the patient voiced suicidal or homicidal ideation and threatened to use the gun.  2. Mood. She reportedly has been treated  with Seroquel and Celexa. Depakote was most likely admitted by neurologist during seizure investigation. This is now being switched to Lamictal. There is no increase. History of seizures and the patient needs antipsychotic.  3. Seizure disorder. We'll continue Lamictal I am not sure about Depakote. We will consult neurology.  4. Hypertension. She is on Norvasc, Coreg, aspirin and clonidine.  5. Hypokalemia. She is on potassium peels. Potassium level is normal.  6. GERD. She is on Protonix.  7. Anxiety. She is on Xanax. We'll try to discontinue given her confusion.  8. Metabolic screening. Lipid profile, TSH, and hemoglobin A1c are normal. Prolactin 9.4.  9. B12 deficiency. We will continue vitamin B12.  10. Disposition. She will be discharged with her fianc. She will follow up with [facility].  I certify that inpatient services furnished can reasonably be expected to improve the patient's condition.   Orson Slick, MD 08/11/2015, 7:58 AM

## 2015-08-11 NOTE — H&P (Signed)
Psychiatric Admission Assessment Adult  Patient Identification: Sandra Charles MRN:  [id number] Date of Evaluation:  08/11/2015 Chief Complaint:  psychosis Principal Diagnosis: Schizoaffective disorder, bipolar type ([place]) Diagnosis:   Patient Active Problem List   Diagnosis Date Noted  . Seizure disorder ([place]) [id number] 08/10/2015  . Cannabis use disorder, moderate, dependence ([place]) [F12.20] 08/10/2015  . Schizoaffective disorder, bipolar type ([place]) [F25.0] 08/10/2015  . Abnormal finding on MRI of brain [R93.0]   . Hypokalemia [E87.6] 07/15/2015  . Confusion [R41.0]   . GERD (gastroesophageal reflux disease) [K21.9] 11/25/2014  . Essential hypertension [I10] 11/25/2014   History of Present Illness:  Identifying data. Sandra Charles is a 60 year old female with history of schizoaffective disorder and seizures.  Chief complaint. The patient is unable to state.  History of present illness. Information was obtained from the patient and the chart. The patient is an extremely poor historian. She is unable to provide any information about the circumstances of her admission. She doesn't know brought her to the hospital and why. She tells me that she just woke up in the hospital this morning. The patient herself does not have any complaints although she believes that she might be depressed. She admits to feeling paranoid and having auditory hallucinations. He denies that she's been seeing a psychiatrist lately. She denies that she's been taking any psychotropic medications. According to the chart she should be taking Celexa and Seroquel. She is also on Depakote prescribed by her neurologist for seizures and Depakote level is therapeutic indicating proper compliance. According to the family the patient started behaving strangely, experiencing auditory hallucinations, threatening to shoot herself and her fianc with a gun, having episodes of confusion. Reportedly there is a gun in the house but no ammunition  available. About a month ago the patient was admitted to the hospital for an episode of seizures. She is still under investigation. Apparently her Depakote is now being changed to Lamictal. The patient denies any thoughts of anxiety or symptoms suggestive of bipolar mania. She denies alcohol use but admits to occasional marijuana smoking. She denies prescription pill abuse. Unfortunately Urine tox screen was not done on admission.  Past psychiatric history. Reportedly she was diagnosed with schizoaffective disorder bipolar type. The patient reports one hospitalization at [facility] several years ago. According to the family she should be taking Seroquel and Celexa. In reviewing her chart and noticed that she has been prescribed Celexa, Xanax, Depakote, and Lamictal from [facility] facilities. In addition to Xanax narcotic painkillers also on her list. The patient denies ever attempting suicide.  Family psychiatric history. The patient reports that her mother struggled with depression.  Social history. She lives with her fianc. She apparently has not been employed for at least a year but at times believes that she is still working. She has Medicaid. Her son and her niece live close by and are supportive.  Total Time spent with patient: 1 hour  Is the patient at risk to self? Yes.    Has the patient been a risk to self in the past 6 months? No.  Has the patient been a risk to self within the distant past? No.  Is the patient a risk to others? Yes.    Has the patient been a risk to others in the past 6 months? No.  Has the patient been a risk to others within the distant past? No.   Prior Inpatient Therapy:   Prior Outpatient Therapy:    Alcohol Screening: 1. How often do you  have a drink containing alcohol?: Never 9. Have you or someone else been injured as a result of your drinking?: No 10. Has a relative or friend or a doctor or another health worker been concerned about your drinking or suggested you cut  down?: No Alcohol Use Disorder Identification Test Final Score (AUDIT): 0 Brief Intervention: AUDIT score less than 7 or less-screening does not suggest unhealthy drinking-brief intervention not indicated Substance Abuse History in the last 12 months:  Yes.   Consequences of Substance Abuse: Negative Previous Psychotropic Medications: Yes  Psychological Evaluations: No  Past Medical History:  Past Medical History  Diagnosis Date  . Hypertension   . Anxiety   . Depression   . Mini stroke ([place])     "several since 05/2014" (07/15/2015)  . Hyperlipidemia   . GERD (gastroesophageal reflux disease)   . Headache     "weekly" (07/15/2015)  . Seizures ([place]) dx'd 04/2015  . Arthritis     "all over"  . Chronic lower back pain     Past Surgical History  Procedure Laterality Date  . Vaginal hysterectomy    . Tubal ligation    . Dilation and curettage of uterus    . Lumbar puncture  07/14/2015   Family History:  Family History  Problem Relation Age of Onset  . Hyperlipidemia Mother   . Breast cancer Mother 101  . Hypertension Mother   . Heart attack Mother     age 59's  . Breast cancer Sister 3  . Hyperlipidemia Father   . Hypertension Father   . Heart attack Father 32    Tobacco Screening: @FLOW ([phone])::1)@ Social History:  History  Alcohol Use  . 0.0 oz/week  . 0 Standard drinks or equivalent per week    Comment: 07/15/2015 "nothing in the last few years"     History  Drug Use No    Additional Social History:                           Allergies:   Allergies  Allergen Reactions  . Lovastatin Other (See Comments)  . Tramadol Other (See Comments)   Lab Results:  Results for orders placed or performed during the hospital encounter of 08/10/15 (from the past 48 hour(s))  Hemoglobin A1c     Status: Abnormal   Collection Time: 08/10/15  3:20 PM  Result Value Ref Range   Hgb A1c MFr Bld 6.1 (H) 4.0 - 6.0 %  Lipid panel, fasting     Status: None    Collection Time: 08/10/15  3:20 PM  Result Value Ref Range   Cholesterol 108 0 - 200 mg/dL   Triglycerides 92 <150 mg/dL   HDL 42 >40 mg/dL   Total CHOL/HDL Ratio 2.6 RATIO   VLDL 18 0 - 40 mg/dL   LDL Cholesterol 48 0 - 99 mg/dL    Comment:        Total Cholesterol/HDL:CHD Risk Coronary Heart Disease Risk Table                     Men   Women  1/2 Average Risk   3.4   3.3  Average Risk       5.0   4.4  2 X Average Risk   9.6   7.1  3 X Average Risk  23.4   11.0        Use the calculated Patient Ratio above and the CHD Risk Table  to determine the patient's CHD Risk.        ATP III CLASSIFICATION (LDL):  <100     mg/dL   Optimal  100-129  mg/dL   Near or Above                    Optimal  130-159  mg/dL   Borderline  160-189  mg/dL   High  >190     mg/dL   Very High   Prolactin     Status: None   Collection Time: 08/10/15  3:20 PM  Result Value Ref Range   Prolactin 9.4 4.8 - 23.3 ng/mL    Comment: (NOTE) Performed At: [facility] [place], [place] [id number] Lindon Romp MD [id number]   TSH     Status: None   Collection Time: 08/10/15  3:20 PM  Result Value Ref Range   TSH 1.058 0.350 - 4.500 uIU/mL  Potassium     Status: None   Collection Time: 08/10/15  3:20 PM  Result Value Ref Range   Potassium 3.5 3.5 - 5.1 mmol/L  Valproic acid level     Status: None   Collection Time: 08/10/15  3:20 PM  Result Value Ref Range   Valproic Acid Lvl 92 50.0 - 100.0 ug/mL  Ammonia     Status: None   Collection Time: 08/10/15  3:24 PM  Result Value Ref Range   Ammonia 21 9 - 35 umol/L    Blood Alcohol level:  Lab Results  Component Value Date   ETH <5 [id number]    Metabolic Disorder Labs:  Lab Results  Component Value Date   HGBA1C 6.1* 08/10/2015   Lab Results  Component Value Date   PROLACTIN 9.4 08/10/2015   PROLACTIN 7.1 07/12/2015   Lab Results  Component Value Date   CHOL 108 08/10/2015   TRIG 92 08/10/2015   HDL 42  08/10/2015   CHOLHDL 2.6 08/10/2015   VLDL 18 08/10/2015   LDLCALC 48 08/10/2015    Current Medications: Current Facility-Administered Medications  Medication Dose Route Frequency Provider Last Rate Last Dose  . acetaminophen (TYLENOL) tablet 650 mg  650 mg Oral Q6H PRN Gonzella Lex, MD      . ALPRAZolam Duanne Moron) tablet 0.25 mg  0.25 mg Oral TID PRN Clovis Fredrickson, MD      . alum & mag hydroxide-simeth (MAALOX/MYLANTA) 200-200-20 MG/5ML suspension 30 mL  30 mL Oral Q4H PRN Gonzella Lex, MD      . amLODipine (NORVASC) tablet 10 mg  10 mg Oral Daily Gonzella Lex, MD      . aspirin EC tablet 81 mg  81 mg Oral Daily Gonzella Lex, MD      . carvedilol (COREG) tablet 25 mg  25 mg Oral BID WC Gonzella Lex, MD   25 mg at 08/10/15 1731  . citalopram (CELEXA) tablet 40 mg  40 mg Oral Daily Gonzella Lex, MD      . cloNIDine (CATAPRES) tablet 0.1 mg  0.1 mg Oral BID Gonzella Lex, MD   0.1 mg at 08/10/15 2200  . lamoTRIgine (LAMICTAL) tablet 25 mg  25 mg Oral Daily Gonzella Lex, MD   25 mg at 08/10/15 1730  . magnesium hydroxide (MILK OF MAGNESIA) suspension 30 mL  30 mL Oral Daily PRN Gonzella Lex, MD      . pantoprazole (PROTONIX) EC tablet 40 mg  40 mg Oral Daily John  T Clapacs, MD      . potassium chloride (K-DUR,KLOR-CON) CR tablet 40 mEq  40 mEq Oral BID Gonzella Lex, MD   40 mEq at 08/10/15 2200  . risperiDONE (RISPERDAL) tablet 2 mg  2 mg Oral BID Amy Belloso B Jasiyah Poland, MD      . vitamin B-12 (CYANOCOBALAMIN) tablet 1,000 mcg  1,000 mcg Oral Daily Lenord Fralix B Lavonya Hoerner, MD       PTA Medications: Prescriptions prior to admission  Medication Sig Dispense Refill Last Dose  . ALPRAZolam (XANAX) 1 MG tablet Take 1 mg by mouth 3 (three) times daily as needed for anxiety.   07/11/2015 at Unknown time  . amLODipine (NORVASC) 10 MG tablet Take 10 mg by mouth daily.   07/11/2015 at Unknown time  . aspirin EC 81 MG tablet Take 81 mg by mouth daily.    07/11/2015 at [facility]   .  carvedilol (COREG) 25 MG tablet Take 25 mg by mouth 2 (two) times daily with a meal.   07/11/2015 at [facility]   . cefUROXime (CEFTIN) 500 MG tablet Take 1 tablet (500 mg total) by mouth 2 (two) times daily with a meal. 14 tablet 0   . Cholecalciferol (VITAMIN D3) 5000 UNITS TABS Take 5,000 Units by mouth daily.   07/11/2015 at Unknown time  . citalopram (CELEXA) 40 MG tablet Take 40 mg by mouth daily.   07/11/2015 at Unknown time  . cloNIDine (CATAPRES) 0.1 MG tablet Take 1 tablet (0.1 mg total) by mouth 2 (two) times daily. 60 tablet 11   . cyanocobalamin (,VITAMIN B-12,) 1000 MCG/ML injection Inject 1 mL (1,000 mcg total) into the muscle daily. 1 mL 0   . divalproex (DEPAKOTE) 500 MG DR tablet Take 1 tablet (500 mg total) by mouth every 8 (eight) hours. 90 tablet 0   . HYDROcodone-acetaminophen (NORCO) 5-325 MG tablet Take 1 tablet by mouth every 6 (six) hours as needed for moderate pain. 20 tablet 0 07/11/2015 at [place]   . pantoprazole (PROTONIX) 40 MG tablet Take 1 tablet (40 mg total) by mouth daily. 30 tablet 11 07/11/2015 at Unknown time    Musculoskeletal: Strength & Muscle Tone: within normal limits Gait & Station: normal Patient leans: N/A  Psychiatric Specialty Exam: I reviewed physical exam performed in the emergency room and agree with her findings. Physical Exam  Nursing note and vitals reviewed.   Review of Systems  Psychiatric/Behavioral: Positive for depression, suicidal ideas, hallucinations and substance abuse.  All other systems reviewed and are negative.   Blood pressure 141/71, pulse 73, temperature 98.5 F (36.9 C), temperature source Oral, resp. rate 20, height 5\' 1"  (1.549 m), weight 64.864 kg (143 lb), SpO2 98 %.Body mass index is 27.03 kg/(m^2).  See SRA.                                                  Sleep:  Number of Hours: 8.45       Treatment Plan Summary: Daily contact with patient to assess and evaluate symptoms and progress in  treatment and Medication management   Sandra Charles is a 60 year old female with a history of schizoaffective disorder  onset neurological problems related to the hospital for auditory hallucinations, paranoia, confusion, and bizarre behaviors disappointed he is in the context of treatment noncompliance with her Depakote level is good.   1.  Suicidal and homicidal ideation: According to the chart the patient voiced suicidal or homicidal ideation and threatened to use the gun.  2. Mood. She reportedly has been treated with Seroquel, Xanax, Celexa and Depakote that is now being switched to Lamictal. Ammonia level is not elevated. Will continue Celexa for depression, start Risperdal for psychosis, and continue Lamictal titration.   3. Seizure disorder. We'll continue Lamictal.  I am not sure about Depakote. We will consult neurology.  4. Hypertension. She is on Norvasc, Coreg, aspirin and clonidine.  5. Hypokalemia. She is on potassium peels. Potassium level is normal.  6. GERD. She is on Protonix.  7. Anxiety. She is on Xanax. We'll try to discontinue given her confusion.  8. Metabolic screening. Lipid profile, TSH, and hemoglobin A1c are normal. Prolactin 9.4.  9. B12 deficiency. We will continue vitamin B12.  10. Disposition. She will be discharged with her fianc. She will follow up with [facility].   Observation Level/Precautions:  15 minute checks Seizure  Laboratory:  CBC Chemistry Profile UDS UA  Psychotherapy:    Medications:    Consultations:    Discharge Concerns:    Estimated LOS:  Other:     I certify that inpatient services furnished can reasonably be expected to improve the patient's condition.    Orson Slick, MD 6/15/20178:10 AM

## 2015-08-11 NOTE — Plan of Care (Signed)
Problem: Education: Goal: Knowledge of [facility] Education information/materials will improve Outcome: Not Progressing Unable to process

## 2015-08-12 ENCOUNTER — Inpatient Hospital Stay: Payer: Medicaid Other

## 2015-08-12 DIAGNOSIS — F250 Schizoaffective disorder, bipolar type: Principal | ICD-10-CM

## 2015-08-12 DIAGNOSIS — G40909 Epilepsy, unspecified, not intractable, without status epilepticus: Secondary | ICD-10-CM

## 2015-08-12 LAB — URINALYSIS COMPLETE WITH MICROSCOPIC (ARMC ONLY)
BILIRUBIN URINE: NEGATIVE
Glucose, UA: NEGATIVE mg/dL
KETONES UR: NEGATIVE mg/dL
NITRITE: NEGATIVE
Protein, ur: NEGATIVE mg/dL
Specific Gravity, Urine: 1.01 (ref 1.005–1.030)
pH: 6 (ref 5.0–8.0)

## 2015-08-12 LAB — URINE DRUG SCREEN, QUALITATIVE (ARMC ONLY)
AMPHETAMINES, UR SCREEN: NOT DETECTED
Barbiturates, Ur Screen: NOT DETECTED
Benzodiazepine, Ur Scrn: POSITIVE — AB
Cannabinoid 50 Ng, Ur ~~LOC~~: POSITIVE — AB
Cocaine Metabolite,Ur ~~LOC~~: NOT DETECTED
MDMA (ECSTASY) UR SCREEN: NOT DETECTED
Methadone Scn, Ur: NOT DETECTED
OPIATE, UR SCREEN: NOT DETECTED
PHENCYCLIDINE (PCP) UR S: NOT DETECTED
Tricyclic, Ur Screen: NOT DETECTED

## 2015-08-12 NOTE — Progress Notes (Signed)
Client has been sitting alone in the dayroom; with little noted interaction; has been seclusive to her room; saying very little; with blank stares; refused PM snack; did accept evening medications; stated that; she did not have to void when asked; as a urine sample is needed. Client did drink a cup of water.

## 2015-08-12 NOTE — B[facility] Group Notes (Signed)
[place] LCSW Group Therapy  08/12/2015 5:12 PM  Type of Therapy:  Group Therapy  Participation Level:  Did Not Attend  Modes of Intervention:  Discussion, Education, Socialization and Support  Summary of Progress/Problems:Feelings around Relapse. Group members discussed the meaning of relapse and shared personal stories of relapse, how it affected them and others, and how they perceived themselves during this time. Group members were encouraged to identify triggers, warning signs and coping skills used when facing the possibility of relapse. Social supports were discussed and explored in detail.   [place] MSW, LCSWA  08/12/2015, 5:12 PM

## 2015-08-12 NOTE — BHH Group Notes (Signed)
[facility] Group Notes:  (Nursing/MHT/Case Management/Adjunct)  Date:  08/12/2015  Time:  11:25 PM  Type of Therapy:  Psychoeducational Skills  Participation Level:  Active  Participation Quality:  Appropriate, Attentive and Sharing  Affect:  Appropriate  Cognitive:  Alert and Oriented  Insight:  Good  Engagement in Group:  Improving  Modes of Intervention:  Discussion and Exploration  Summary of Progress/Problems:  Kathi Ludwig 08/12/2015, 11:25 PM

## 2015-08-12 NOTE — BHH Group Notes (Signed)
[facility] Group Notes:  (Nursing/MHT/Case Management/Adjunct)  Date:  08/12/2015  Time:  4:07 PM  Type of Therapy:  Psychoeducational Skills  Participation Level:  Did Not Attend  Charise Killian 08/12/2015, 4:07 PM

## 2015-08-12 NOTE — Tx Team (Signed)
Interdisciplinary Treatment Plan Update (Adult)  Date:  08/12/2015 Time Reviewed:  4:44 PM  Progress in Treatment: Attending groups: No. Participating in groups:  No. Taking medication as prescribed:  Yes. Tolerating medication:  Yes. Family/Significant othe contact made:  No, will contact:  CSW assessing  Patient understands diagnosis:  No. and As evidenced by:  Limited insight  Discussing patient identified problems/goals with staff:  Yes. Medical problems stabilized or resolved:  Yes. Denies suicidal/homicidal ideation: Yes. Issues/concerns per patient self-inventory:  Yes. Other:  New problem(s) identified: No, Describe:  NA  Discharge Plan or Barriers: Pt plans to return home and follow up with outpatient.    Reason for Continuation of Hospitalization: Hallucinations Medical Issues Medication stabilization Other; describe [place]  Comments:The patient is an extremely poor historian. She is unable to provide any information about the circumstances of her admission. She doesn't know brought her to the hospital and why. She tells me that she just woke up in the hospital this morning. The patient herself does not have any complaints although she believes that she might be depressed. She admits to feeling paranoid and having auditory hallucinations. He denies that she's been seeing a psychiatrist lately. She denies that she's been taking any psychotropic medications. According to the chart she should be taking Celexa and Seroquel. She is also on Depakote prescribed by her neurologist for seizures and Depakote level is therapeutic indicating proper compliance. According to the family the patient started behaving strangely, experiencing auditory hallucinations, threatening to shoot herself and her fianc with a gun, having episodes of confusion. Reportedly there is a gun in the house but no ammunition available. About a month ago the patient was admitted to the hospital for an episode of  seizures. She is still under investigation. Apparently her Depakote is now being changed to Lamictal. The patient denies any thoughts of anxiety or symptoms suggestive of bipolar mania. She denies alcohol use but admits to occasional marijuana smoking. She denies prescription pill abuse. Unfortunately Urine tox screen was not done on admission.  Estimated length of stay: 7 days   New goal(s): NA  Review of initial/current patient goals per problem list:   1.  Goal(s): Patient will participate in aftercare plan * Met:  * Target date: at discharge * As evidenced by: Patient will participate within aftercare plan AEB aftercare provider and housing plan at discharge being identified.  2.  Goal (s): Patient will demonstrate decreased symptoms of psychosis. * Met: No  *  Target date: at discharge * As evidenced by: Patient will not endorse signs of psychosis or be deemed stable for discharge by MD.   Attendees: Patient:  Sandra Charles 6/16/20174:44 PM  Family:   6/16/20174:44 PM  Physician:   Dr. Bary Leriche  6/16/20174:44 PM  Nursing:   Hulan Amato, RN 6/16/20174:44 PM  Case Manager:   6/16/20174:44 PM  Counselor:   6/16/20174:44 PM  Other:  Wray Kearns, [place] 6/16/20174:44 PM  Other:  Everitt Amber, [place]  6/16/20174:44 PM  Other:   6/16/20174:44 PM  Other:  6/16/20174:44 PM  Other:  6/16/20174:44 PM  Other:  6/16/20174:44 PM  Other:  6/16/20174:44 PM  Other:  6/16/20174:44 PM  Other:  6/16/20174:44 PM  Other:   6/16/20174:44 PM   Scribe for Treatment Team:   Wray Kearns, MSW, [place]  08/12/2015, 4:44 PM

## 2015-08-12 NOTE — Progress Notes (Signed)
Recreation Therapy Notes  Date: 06.16.17 Time: 9:30 am Location: Craft Room  Group Topic: Coping Skills  Goal Area(s) Addresses:  Patient will participate in healthy coping skill. Patient will verbalize using art as a coping skill.  Behavioral Response: Did not attend  Intervention: Coloring  Activity: Patients were given coloring sheets to color and were instructed to think about what their mind was focus on and what emotions they felt.  Education: LRT educated patients on healthy coping skills.  Education Outcome: Patient did not attend group.   Clinical Observations/Feedback: Patient did not attend group.  Leonette Monarch, LRT/CTRS 08/12/2015 10:23 AM

## 2015-08-12 NOTE — Progress Notes (Signed)
Patient noted with a flat, depressed affect. She was slow to answering questions and required time to respond. Patient denied +ve AVH/SI and contracted for safety. She had an EEG test done this morning and was cooperative. Patient did not attend any groups and remained isolated to her room. Appeared tired and sleepy this afternoon. Will continue to monitor closely to ensure safety.

## 2015-08-12 NOTE — Progress Notes (Signed)
[facility] MD Progress Note  08/12/2015 10:52 AM [name]  MRN:  [id number]  Subjective:  Ms. Kassebaum is a 60 year old schizoaffective disorder admitting for hallucinations and confusion. She was hospitalized recently on the medical floor and has been under investigation for seizure disorder. She was started on Depakote by Dr. Melrose Nakayama. She is not transition Lamictal. Ultimately requested EEG before discharge.  Today Ms. Thetford reports feeling better but is slumped in bed hardly responding to my questions. She had breakfast and slept well. She complains that her room still feels called in and out with increased temperature yesterday. She is confused and unable to explain why she is in the hospital. She was answering the phone that is positioned next to her room but was unable to find her way back. She accepts medications and tolerates them well. She has not been participating in groups.   Principal Problem: Schizoaffective disorder, bipolar type ([place]) Diagnosis:   Patient Active Problem List   Diagnosis Date Noted  . Seizure disorder ([place]) [id number] 08/10/2015  . Cannabis use disorder, moderate, dependence ([place]) [F12.20] 08/10/2015  . Schizoaffective disorder, bipolar type ([place]) [F25.0] 08/10/2015  . Abnormal finding on MRI of brain [R93.0]   . Hypokalemia [E87.6] 07/15/2015  . Confusion [R41.0]   . GERD (gastroesophageal reflux disease) [K21.9] 11/25/2014  . Essential hypertension [I10] 11/25/2014   Total Time spent with patient: 20 minutes  Past Psychiatric History: Schizoaffective disorder, cognitive decline.  Past Medical History:  Past Medical History  Diagnosis Date  . Hypertension   . Anxiety   . Depression   . Mini stroke ([place])     "several since 05/2014" (07/15/2015)  . Hyperlipidemia   . GERD (gastroesophageal reflux disease)   . Headache     "weekly" (07/15/2015)  . Seizures ([place]) dx'd 04/2015  . Arthritis     "all over"  . Chronic lower back pain     Past Surgical History   Procedure Laterality Date  . Vaginal hysterectomy    . Tubal ligation    . Dilation and curettage of uterus    . Lumbar puncture  07/14/2015   Family History:  Family History  Problem Relation Age of Onset  . Hyperlipidemia Mother   . Breast cancer Mother 42  . Hypertension Mother   . Heart attack Mother     age 22's  . Breast cancer Sister 45  . Hyperlipidemia Father   . Hypertension Father   . Heart attack Father 59   Family Psychiatric  History: See H&P. Social History:  History  Alcohol Use  . 0.0 oz/week  . 0 Standard drinks or equivalent per week    Comment: 07/15/2015 "nothing in the last few years"     History  Drug Use No    Social History   Social History  . Marital Status: Single    Spouse Name: N/A  . Number of Children: N/A  . Years of Education: N/A   Occupational History  . disabled    Social History Main Topics  . Smoking status: Former Smoker -- 1.00 packs/day for 5 years    Types: Cigarettes  . Smokeless tobacco: Never Used     Comment: "quit smoking cigarettes in the early 2000s"  . Alcohol Use: 0.0 oz/week    0 Standard drinks or equivalent per week     Comment: 07/15/2015 "nothing in the last few years"  . Drug Use: No  . Sexual Activity: Not Asked   Other Topics Concern  .  None   Social History Narrative   Additional Social History:                         Sleep: Fair  Appetite:  Fair  Current Medications: Current Facility-Administered Medications  Medication Dose Route Frequency Provider Last Rate Last Dose  . acetaminophen (TYLENOL) tablet 650 mg  650 mg Oral Q6H PRN Gonzella Lex, MD      . ALPRAZolam Duanne Moron) tablet 0.25 mg  0.25 mg Oral TID PRN Clovis Fredrickson, MD   0.25 mg at 08/11/15 2149  . alum & mag hydroxide-simeth (MAALOX/MYLANTA) 200-200-20 MG/5ML suspension 30 mL  30 mL Oral Q4H PRN Gonzella Lex, MD      . amLODipine (NORVASC) tablet 10 mg  10 mg Oral Daily Gonzella Lex, MD   10 mg at 08/12/15  0908  . aspirin EC tablet 81 mg  81 mg Oral Daily Gonzella Lex, MD   81 mg at 08/12/15 0908  . carvedilol (COREG) tablet 25 mg  25 mg Oral BID WC Gonzella Lex, MD   25 mg at 08/12/15 0907  . citalopram (CELEXA) tablet 40 mg  40 mg Oral Daily Gonzella Lex, MD   40 mg at 08/12/15 0908  . cloNIDine (CATAPRES) tablet 0.1 mg  0.1 mg Oral BID Gonzella Lex, MD   0.1 mg at 08/12/15 0907  . lamoTRIgine (LAMICTAL) tablet 25 mg  25 mg Oral Daily Gonzella Lex, MD   25 mg at 08/12/15 0908  . magnesium hydroxide (MILK OF MAGNESIA) suspension 30 mL  30 mL Oral Daily PRN Gonzella Lex, MD      . pantoprazole (PROTONIX) EC tablet 40 mg  40 mg Oral Daily Gonzella Lex, MD   40 mg at 08/12/15 0907  . potassium chloride (K-DUR,KLOR-CON) CR tablet 40 mEq  40 mEq Oral BID Gonzella Lex, MD   40 mEq at 08/12/15 0907  . risperiDONE (RISPERDAL) tablet 2 mg  2 mg Oral BID Clovis Fredrickson, MD   2 mg at 08/12/15 0908  . vitamin B-12 (CYANOCOBALAMIN) tablet 1,000 mcg  1,000 mcg Oral Daily Clovis Fredrickson, MD   1,000 mcg at 08/12/15 0908    Lab Results:  Results for orders placed or performed during the hospital encounter of 08/10/15 (from the past 48 hour(s))  Hemoglobin A1c     Status: Abnormal   Collection Time: 08/10/15  3:20 PM  Result Value Ref Range   Hgb A1c MFr Bld 6.1 (H) 4.0 - 6.0 %  Lipid panel, fasting     Status: None   Collection Time: 08/10/15  3:20 PM  Result Value Ref Range   Cholesterol 108 0 - 200 mg/dL   Triglycerides 92 <150 mg/dL   HDL 42 >40 mg/dL   Total CHOL/HDL Ratio 2.6 RATIO   VLDL 18 0 - 40 mg/dL   LDL Cholesterol 48 0 - 99 mg/dL    Comment:        Total Cholesterol/HDL:CHD Risk Coronary Heart Disease Risk Table                     Men   Women  1/2 Average Risk   3.4   3.3  Average Risk       5.0   4.4  2 X Average Risk   9.6   7.1  3 X Average Risk  23.4   11.0  Use the calculated Patient Ratio above and the CHD Risk Table to determine the  patient's CHD Risk.        ATP III CLASSIFICATION (LDL):  <100     mg/dL   Optimal  100-129  mg/dL   Near or Above                    Optimal  130-159  mg/dL   Borderline  160-189  mg/dL   High  >190     mg/dL   Very High   Prolactin     Status: None   Collection Time: 08/10/15  3:20 PM  Result Value Ref Range   Prolactin 9.4 4.8 - 23.3 ng/mL    Comment: (NOTE) Performed At: [facility] [place], [place] [id number] Lindon Romp MD [id number]   TSH     Status: None   Collection Time: 08/10/15  3:20 PM  Result Value Ref Range   TSH 1.058 0.350 - 4.500 uIU/mL  Potassium     Status: None   Collection Time: 08/10/15  3:20 PM  Result Value Ref Range   Potassium 3.5 3.5 - 5.1 mmol/L  Valproic acid level     Status: None   Collection Time: 08/10/15  3:20 PM  Result Value Ref Range   Valproic Acid Lvl 92 50.0 - 100.0 ug/mL  Ammonia     Status: None   Collection Time: 08/10/15  3:24 PM  Result Value Ref Range   Ammonia 21 9 - 35 umol/L    Blood Alcohol level:  Lab Results  Component Value Date   ETH <5 08/10/2015    Physical Findings: AIMS: Facial and Oral Movements Muscles of Facial Expression: None, normal Lips and Perioral Area: None, normal Jaw: None, normal Tongue: None, normal,Extremity Movements Upper (arms, wrists, hands, fingers): None, normal Lower (legs, knees, ankles, toes): None, normal, Trunk Movements Neck, shoulders, hips: None, normal, Overall Severity Severity of abnormal movements (highest score from questions above): None, normal Incapacitation due to abnormal movements: None, normal Patient's awareness of abnormal movements (rate only patient's report): No Awareness, Dental Status Current problems with teeth and/or dentures?: No Does patient usually wear dentures?: No  CIWA:    COWS:     Musculoskeletal: Strength & Muscle Tone: within normal limits Gait & Station: normal Patient leans: N/A  Psychiatric Specialty  Exam: Physical Exam  Nursing note reviewed.   Review of Systems  Psychiatric/Behavioral: Positive for hallucinations and memory loss.  All other systems reviewed and are negative.   Blood pressure 118/67, pulse 68, temperature 98.1 F (36.7 C), temperature source Oral, resp. rate 18, height 5\' 1"  (1.549 m), weight 64.864 kg (143 lb), SpO2 98 %.Body mass index is 27.03 kg/(m^2).  General Appearance: Casual  Eye Contact:  Poor  Speech:  Slow  Volume:  Decreased  Mood:  Anxious  Affect:  Blunt  Thought Process:  Linear  Orientation:  Full (Time, Place, and Person)  Thought Content:  Hallucinations: Auditory  Suicidal Thoughts:  No  Homicidal Thoughts:  No  Memory:  Immediate;   Poor Recent;   Poor Remote;   Poor  Judgement:  Poor  Insight:  Lacking  Psychomotor Activity:  Decreased  Concentration:  Concentration: Poor and Attention Span: Poor  Recall:  Poor  Fund of Knowledge:  Poor  Language:  Poor  Akathisia:  No  Handed:  Right  AIMS (if indicated):     Assets:  Desire for Improvement Housing Resilience  Social Support  ADL's:  Intact  Cognition:  WNL  Sleep:  Number of Hours: 9.3     Treatment Plan Summary: Daily contact with patient to assess and evaluate symptoms and progress in treatment and Medication management   Ms. Slowik is a 60 year old female with a history of schizoaffective disorder onset neurological problems related to the hospital for auditory hallucinations, paranoia, confusion, and bizarre behaviors disappointed he is in the context of treatment noncompliance with her Depakote level is good.   1. Suicidal and homicidal ideation: According to the chart the patient voiced suicidal or homicidal ideation and threatened to use the gun.  2. Mood. She reportedly has been treated with Seroquel, Xanax, Celexa and Depakote that is now being switched to Lamictal. Ammonia level is not elevated. Will continue Celexa for depression, start Risperdal for psychosis,  and continue Lamictal titration.   3. Seizure disorder. We continued Lamictal taht was just started by Dr. Melrose Nakayama. Depakote was discontinued. I ordered EEG as requested by Dr Melrose Nakayama but was unable to get in touch with Dr. Jules Husbands.   4. Hypertension. She is on Norvasc, Coreg, aspirin and clonidine.  5. Hypokalemia. She is on potassium peels. Potassium level is normal.  6. GERD. She is on Protonix.  7. Anxiety. She is on Xanax. We gradually discontinued it given her confusion.  8. Metabolic screening. Lipid profile, TSH, and hemoglobin A1c are normal. Prolactin 9.4.  9. B12 deficiency. We will continue vitamin B12.  10. Disposition. She will be discharged with her fianc. She will follow up with [facility].  Orson Slick, MD 08/12/2015, 10:52 AM

## 2015-08-12 NOTE — Plan of Care (Signed)
Problem: Activity: Goal: Interest or engagement in activities will improve Outcome: Progressing Patient took a shower today with encouragement.

## 2015-08-13 MED ORDER — WHITE PETROLATUM GEL
Status: DC | PRN
  Filled 2015-08-13: qty 28.35

## 2015-08-13 NOTE — B[facility] Group Notes (Signed)
[facility] LCSW Group Therapy  08/13/2015 2:21 PM  Type of Therapy:  Group Therapy  Participation Level:  Did Not Attend  Modes of Intervention:  Activity, Discussion, Education, Socialization and Support  Summary of Progress/Problems: Pt will identify unhealthy thoughts and how they impact their emotions and behavior. Pt will be encouraged to discuss these thoughts, emotions and behaviors with the group.    [place] MSW, [place]  08/13/2015, 2:21 PM

## 2015-08-13 NOTE — Plan of Care (Signed)
Problem: Health Behavior/Discharge Planning: Goal: Compliance with treatment plan for underlying cause of condition will improve Outcome: Not Progressing Patient did not attend group this morning even after encouragement. She however remains medication compliant.

## 2015-08-13 NOTE — [facility] Group Notes (Signed)
[facility] Group Notes:  (Nursing/MHT/Case Management/Adjunct)  Date:  08/13/2015  Time:  10:42 PM  Type of Therapy:  Psychoeducational Skills  Participation Level:  Did Not Attend  Summary of Progress/Problems:  Reece Agar 08/13/2015, 10:42 PM

## 2015-08-13 NOTE — BHH Counselor (Signed)
Adult Comprehensive Assessment  Patient ID: Sandra Charles, female   DOB: 07-Apr-1955, 60 y.o.   MRN: [id number]  Information Source: Information source: Patient  Current Stressors:  Educational / Learning stressors: Denies stressors Employment / Job issues: Denies stressors Family Relationships: Denies Engineer, mining / Lack of resources (include bankruptcy): Not always enough money Housing / Lack of housing: Denies stressors Physical health (include injuries & life threatening diseases): Denies stressors Social relationships: Denies stressors Substance abuse: Denies stressors Bereavement / Loss: Denies stressors  Living/Environment/Situation:  Living Arrangements: Spouse/significant other (Boyfriend) Living conditions (as described by patient or guardian): House, safe neighborhood, no pets, clean. How long has patient lived in current situation?: At least 5 years What is atmosphere in current home: Comfortable, Supportive  Family History:  Marital status: Long term relationship Long term relationship, how long?: Over 20 years What types of issues is patient dealing with in the relationship?: None Are you sexually active?: Yes What is your sexual orientation?: Straight Has your sexual activity been affected by drugs, alcohol, medication, or emotional stress?: None Does patient have children?: Yes How many children?: 1 How is patient's relationship with their children?: Son is over 21yo - Real close.  Childhood History:  By whom was/is the patient raised?: Both parents Description of patient's relationship with caregiver when they were a child: Very good with both parents Patient's description of current relationship with people who raised him/her: Both parents are deceased How were you disciplined when you got in trouble as a child/adolescent?: Paddled or things taken away Does patient have siblings?: Yes Number of Siblings: 4 Description of patient's current relationship  with siblings: 3 sisters and 1 brother - good relationship with all.  They all live locally except one. Did patient suffer any verbal/emotional/physical/sexual abuse as a child?: No Did patient suffer from severe childhood neglect?: No Has patient ever been sexually abused/assaulted/raped as an adolescent or adult?: No Was the patient ever a victim of a crime or a disaster?: No Witnessed domestic violence?: No Has patient been effected by domestic violence as an adult?: No  Education:  Highest grade of school patient has completed: 11th grade Currently a student?: No Learning disability?: No  Employment/Work Situation:   Employment situation: On disability Why is patient on disability: "Nerves" - mental health reasons How long has patient been on disability: Has not been long What is the longest time patient has a held a job?: Years Where was the patient employed at that time?: [employer] Has patient ever been in the [employer]?: No Are There Guns or Other Weapons in [place]?: No  Financial Resources:   Museum/gallery curator resources: Teacher, early years/pre, [employer], Food stamps Does patient have a Programmer, applications or guardian?: No  Alcohol/Substance Abuse:   What has been your use of drugs/alcohol within the last 12 months?: Marijuana about every other day, denies alcohol use and other substance use Alcohol/Substance Abuse Treatment Hx: Denies past history Has alcohol/substance abuse ever caused legal problems?: No  Social Support System:   Pensions consultant Support System: Good Describe Community Support System: Son, boyfriend, sisters Type of faith/religion: Darrick Meigs How does patient's faith help to cope with current illness?: Sometimes goes to church  Leisure/Recreation:   Leisure and Hobbies: Play with granddaughter  Strengths/Needs:   What things does the patient do well?: Good mother and good grandmother, can cook, keeps house clean In what areas does patient struggle / problems  for patient: Having enough money to pay bills  Discharge Plan:  Does patient have access to transportation?: Yes Will patient be returning to same living situation after discharge?: Yes Currently receiving community mental health services: Yes (From Whom) (Dr. Rosine Door, [facility]) Does patient have financial barriers related to discharge medications?: No  Summary/Recommendations:   Summary and Recommendations (to be completed by the evaluator): Patient is a 60yo female with a history of Schizophrenia, Bipolar Disorder, and Anxiety who was hospitalized under Involuntary Commitment initiated by son due to paranoid behavior, hallucinations, and not taking her medications.  She had not remembered eating, had not bathed in 5 days, stopped UTI medication because she stated it hurt her stomach, and told boyfriend she was going to shoot him in the head.  She owns a gun but it is not known where any bullets may be.  Patient will benefit from crisis stabilization, medication evaluation, group therapy and psychoeducation in addition to case management for discharge. At discharge, it is recommended that patient remain compliant with established discharge plan and continued treatment.   Lysle Dingwall. 08/13/2015

## 2015-08-13 NOTE — Progress Notes (Signed)
Patient flat in affect, preoccupied in thought process, isolative to her room except for meals and medications. Patient encouraged to attend this morning's group but she refused, stated that she was going to lay down in her room. Patient denied SI/AVH and contracted for safety. Frequent verbal contacts with patient but she appeared disinterested and depressed. Patient encouraged to talk to nursing staff and encouraged to interact with peers. Will continue to monitor patient on 15 min checks to ensure safety.

## 2015-08-13 NOTE — Progress Notes (Signed)
[facility] MD Progress Note  08/13/2015 10:07 AM [name]  MRN:  [id number]  Subjective:  Ms. Weidert is a 60 year old schizoaffective disorder admitting for hallucinations and confusion. She was hospitalized recently on the medical floor and has been under investigation for seizure disorder. She was started on Depakote by Dr. Melrose Nakayama. She is not transition Lamictal. Ultimately requested EEG before discharge.  Patient lying in bed, minimally responsive. She was vague with all her answers.  Denies problems with mood, appetite, sleep, energy or concentration. Denies suicidality, homicidality or having auditory or visual hallucinations. Denies having any problems with her current medications. Denies having any seizures or other physical complaints.  Nursing: No major events overnight   Principal Problem: Schizoaffective disorder, bipolar type ([place]) Diagnosis:   Patient Active Problem List   Diagnosis Date Noted  . Seizure disorder ([place]) [id number] 08/10/2015  . Cannabis use disorder, moderate, dependence ([place]) [F12.20] 08/10/2015  . Schizoaffective disorder, bipolar type ([place]) [F25.0] 08/10/2015  . Abnormal finding on MRI of brain [R93.0]   . Hypokalemia [E87.6] 07/15/2015  . Confusion [R41.0]   . GERD (gastroesophageal reflux disease) [K21.9] 11/25/2014  . Essential hypertension [I10] 11/25/2014   Total Time spent with patient: 20 minutes  Past Psychiatric History: Schizoaffective disorder, cognitive decline.  Past Medical History:  Past Medical History  Diagnosis Date  . Hypertension   . Anxiety   . Depression   . Mini stroke ([place])     "several since 05/2014" (07/15/2015)  . Hyperlipidemia   . GERD (gastroesophageal reflux disease)   . Headache     "weekly" (07/15/2015)  . Seizures ([place]) dx'd 04/2015  . Arthritis     "all over"  . Chronic lower back pain     Past Surgical History  Procedure Laterality Date  . Vaginal hysterectomy    . Tubal ligation    . Dilation and curettage of  uterus    . Lumbar puncture  07/14/2015   Family History:  Family History  Problem Relation Age of Onset  . Hyperlipidemia Mother   . Breast cancer Mother 56  . Hypertension Mother   . Heart attack Mother     age 5's  . Breast cancer Sister 56  . Hyperlipidemia Father   . Hypertension Father   . Heart attack Father 51   Family Psychiatric  History: See H&P. Social History:  History  Alcohol Use  . 0.0 oz/week  . 0 Standard drinks or equivalent per week    Comment: 07/15/2015 "nothing in the last few years"     History  Drug Use No    Social History   Social History  . Marital Status: Single    Spouse Name: N/A  . Number of Children: N/A  . Years of Education: N/A   Occupational History  . disabled    Social History Main Topics  . Smoking status: Former Smoker -- 1.00 packs/day for 5 years    Types: Cigarettes  . Smokeless tobacco: Never Used     Comment: "quit smoking cigarettes in the early 2000s"  . Alcohol Use: 0.0 oz/week    0 Standard drinks or equivalent per week     Comment: 07/15/2015 "nothing in the last few years"  . Drug Use: No  . Sexual Activity: Not Asked   Other Topics Concern  . None   Social History Narrative   Additional Social History:  Sleep: Fair  Appetite:  Fair  Current Medications: Current Facility-Administered Medications  Medication Dose Route Frequency Provider Last Rate Last Dose  . acetaminophen (TYLENOL) tablet 650 mg  650 mg Oral Q6H PRN Gonzella Lex, MD      . alum & mag hydroxide-simeth (MAALOX/MYLANTA) 200-200-20 MG/5ML suspension 30 mL  30 mL Oral Q4H PRN Gonzella Lex, MD      . amLODipine (NORVASC) tablet 10 mg  10 mg Oral Daily Gonzella Lex, MD   10 mg at 08/13/15 0913  . aspirin EC tablet 81 mg  81 mg Oral Daily Gonzella Lex, MD   81 mg at 08/13/15 0914  . carvedilol (COREG) tablet 25 mg  25 mg Oral BID WC Gonzella Lex, MD   25 mg at 08/13/15 0914  . citalopram (CELEXA)  tablet 40 mg  40 mg Oral Daily Gonzella Lex, MD   40 mg at 08/13/15 0914  . cloNIDine (CATAPRES) tablet 0.1 mg  0.1 mg Oral BID Gonzella Lex, MD   0.1 mg at 08/13/15 0914  . lamoTRIgine (LAMICTAL) tablet 25 mg  25 mg Oral Daily Gonzella Lex, MD   25 mg at 08/13/15 0914  . magnesium hydroxide (MILK OF MAGNESIA) suspension 30 mL  30 mL Oral Daily PRN Gonzella Lex, MD      . pantoprazole (PROTONIX) EC tablet 40 mg  40 mg Oral Daily Gonzella Lex, MD   40 mg at 08/13/15 0914  . potassium chloride (K-DUR,KLOR-CON) CR tablet 40 mEq  40 mEq Oral BID Gonzella Lex, MD   40 mEq at 08/13/15 0914  . risperiDONE (RISPERDAL) tablet 2 mg  2 mg Oral BID Clovis Fredrickson, MD   2 mg at 08/13/15 0914  . vitamin B-12 (CYANOCOBALAMIN) tablet 1,000 mcg  1,000 mcg Oral Daily Clovis Fredrickson, MD   1,000 mcg at 08/13/15 [id number]    Lab Results:  No results found for this or any previous visit (from the past 48 hour(s)).  Blood Alcohol level:  Lab Results  Component Value Date   ETH <5 08/10/2015    Physical Findings: AIMS: Facial and Oral Movements Muscles of Facial Expression: None, normal Lips and Perioral Area: None, normal Jaw: None, normal Tongue: None, normal,Extremity Movements Upper (arms, wrists, hands, fingers): None, normal Lower (legs, knees, ankles, toes): None, normal, Trunk Movements Neck, shoulders, hips: None, normal, Overall Severity Severity of abnormal movements (highest score from questions above): None, normal Incapacitation due to abnormal movements: None, normal Patient's awareness of abnormal movements (rate only patient's report): No Awareness, Dental Status Current problems with teeth and/or dentures?: No Does patient usually wear dentures?: No  CIWA:    COWS:     Musculoskeletal: Strength & Muscle Tone: within normal limits Gait & Station: normal Patient leans: N/A  Psychiatric Specialty Exam: Physical Exam  Nursing note reviewed.   Review of Systems   Psychiatric/Behavioral: Positive for hallucinations and memory loss.  All other systems reviewed and are negative.   Blood pressure 106/64, pulse 78, temperature 97.7 F (36.5 C), temperature source Oral, resp. rate 16, height 5\' 1"  (1.549 m), weight 64.864 kg (143 lb), SpO2 98 %.Body mass index is 27.03 kg/(m^2).  General Appearance: Casual  Eye Contact:  Poor  Speech:  Slow  Volume:  Decreased  Mood:  Anxious  Affect:  Blunt  Thought Process:  Linear  Orientation:  Full (Time, Place, and Person)  Thought Content:  Hallucinations: Auditory  Suicidal  Thoughts:  No  Homicidal Thoughts:  No  Memory:  Immediate;   Poor Recent;   Poor Remote;   Poor  Judgement:  Poor  Insight:  Lacking  Psychomotor Activity:  Decreased  Concentration:  Concentration: Poor and Attention Span: Poor  Recall:  Poor  Fund of Knowledge:  Poor  Language:  Poor  Akathisia:  No  Handed:  Right  AIMS (if indicated):     Assets:  Desire for Improvement Housing Resilience Social Support  ADL's:  Intact  Cognition:  WNL  Sleep:  Number of Hours: 7     Treatment Plan Summary: Daily contact with patient to assess and evaluate symptoms and progress in treatment and Medication management   Ms. Calisto is a 60 year old female with a history of schizoaffective disorder onset neurological problems related to the hospital for auditory hallucinations, paranoia, confusion, and bizarre behaviors disappointed he is in the context of treatment noncompliance with her Depakote level is good.   1. Suicidal and homicidal ideation: According to the chart the patient voiced suicidal or homicidal ideation and threatened to use the gun.  2. Mood. She reportedly has been treated with Seroquel, Xanax, Celexa and Depakote that is now being switched to Lamictal. Ammonia level is not elevated. Will continue Celexa for depression, start Risperdal for psychosis, and continue Lamictal titration.   3. Seizure disorder. We  continued Lamictal taht was just started by Dr. Melrose Nakayama. Depakote was discontinued. I ordered EEG as requested by Dr Melrose Nakayama but was unable to get in touch with Dr. Jules Husbands.   4. Hypertension. She is on Norvasc, Coreg, aspirin and clonidine.  5. Hypokalemia. She is on potassium peels. Potassium level is normal.  6. GERD. She is on Protonix.  7. Anxiety. She is on Xanax. We gradually discontinued it given her confusion.  8. Metabolic screening. Lipid profile, TSH, and hemoglobin A1c are normal. Prolactin 9.4.  9. B12 deficiency. We will continue vitamin B12.  10. Disposition. She will be discharged with her fianc. She will follow up with [facility].  The patient is improving. She is tolerating medications well. Her only request today was for me to order Vaseline.  No changes will be made to her medication regimen.  Hildred Priest, MD 08/13/2015, 10:07 AM

## 2015-08-14 NOTE — Progress Notes (Signed)
[facility] MD Progress Note  08/14/2015 9:41 AM Sandra Charles  MRN:  [id number]  Subjective:  Sandra Charles is a 60 year old schizoaffective disorder admitting for hallucinations and confusion. She was hospitalized recently on the medical floor and has been under investigation for seizure disorder. She was started on Depakote by Dr. Melrose Nakayama. She is not transition Lamictal. Ultimately requested EEG before discharge.  Patient lying in bed, minimally responsive. She was vague with all her answers.  Denies problems with mood, appetite, sleep, energy or concentration. Denies suicidality, homicidality or having auditory or visual hallucinations. Denies having any problems with her current medications. Denies having any seizures or other physical complaints. Patient says she spent dissipating all the groups  This patient is not leaving her room, nursing staff reports poor grooming and hygiene. Patient is Not participating in any groups.  Nursing:  Client presents with a flat/sullen affect; visited with her son; with little interaction; refused PM snack; took her medications; stayed in the bed most of the evening; staff asked about hygiene; as, she has on the same clothing for the past several days; her son brought in clean clothing. Client states, "I will shower and change the bed in the morning; denies S.I./H.I. When asked Client about having any auditory or visual hallucinations; Client just stares; will continue to monitor client's status.   Principal Problem: Schizoaffective disorder, bipolar type ([place]) Diagnosis:   Patient Active Problem List   Diagnosis Date Noted  . Seizure disorder ([place]) [id number] 08/10/2015  . Cannabis use disorder, moderate, dependence ([place]) [F12.20] 08/10/2015  . Schizoaffective disorder, bipolar type ([place]) [F25.0] 08/10/2015  . Abnormal finding on MRI of brain [R93.0]   . Hypokalemia [E87.6] 07/15/2015  . Confusion [R41.0]   . GERD (gastroesophageal reflux disease) [K21.9]  11/25/2014  . Essential hypertension [I10] 11/25/2014   Total Time spent with patient: 20 minutes  Past Psychiatric History: Schizoaffective disorder, cognitive decline.  Past Medical History:  Past Medical History  Diagnosis Date  . Hypertension   . Anxiety   . Depression   . Mini stroke ([place])     "several since 05/2014" (07/15/2015)  . Hyperlipidemia   . GERD (gastroesophageal reflux disease)   . Headache     "weekly" (07/15/2015)  . Seizures ([place]) dx'd 04/2015  . Arthritis     "all over"  . Chronic lower back pain     Past Surgical History  Procedure Laterality Date  . Vaginal hysterectomy    . Tubal ligation    . Dilation and curettage of uterus    . Lumbar puncture  07/14/2015   Family History:  Family History  Problem Relation Age of Onset  . Hyperlipidemia Mother   . Breast cancer Mother 47  . Hypertension Mother   . Heart attack Mother     age 4's  . Breast cancer Sister 35  . Hyperlipidemia Father   . Hypertension Father   . Heart attack Father 64   Family Psychiatric  History: See H&P. Social History:  History  Alcohol Use  . 0.0 oz/week  . 0 Standard drinks or equivalent per week    Comment: 07/15/2015 "nothing in the last few years"     History  Drug Use No    Social History   Social History  . Marital Status: Single    Spouse Name: N/A  . Number of Children: N/A  . Years of Education: N/A   Occupational History  . disabled    Social History Main Topics  .  Smoking status: Former Smoker -- 1.00 packs/day for 5 years    Types: Cigarettes  . Smokeless tobacco: Never Used     Comment: "quit smoking cigarettes in the early 2000s"  . Alcohol Use: 0.0 oz/week    0 Standard drinks or equivalent per week     Comment: 07/15/2015 "nothing in the last few years"  . Drug Use: No  . Sexual Activity: Not Asked   Other Topics Concern  . None   Social History Narrative   Additional Social History:                         Sleep:  Fair  Appetite:  Fair  Current Medications: Current Facility-Administered Medications  Medication Dose Route Frequency Provider Last Rate Last Dose  . acetaminophen (TYLENOL) tablet 650 mg  650 mg Oral Q6H PRN Gonzella Lex, MD      . alum & mag hydroxide-simeth (MAALOX/MYLANTA) 200-200-20 MG/5ML suspension 30 mL  30 mL Oral Q4H PRN Gonzella Lex, MD      . amLODipine (NORVASC) tablet 10 mg  10 mg Oral Daily Gonzella Lex, MD   10 mg at 08/14/15 0856  . aspirin EC tablet 81 mg  81 mg Oral Daily Gonzella Lex, MD   81 mg at 08/14/15 0858  . carvedilol (COREG) tablet 25 mg  25 mg Oral BID WC Gonzella Lex, MD   25 mg at 08/14/15 0857  . citalopram (CELEXA) tablet 40 mg  40 mg Oral Daily Gonzella Lex, MD   40 mg at 08/14/15 0857  . cloNIDine (CATAPRES) tablet 0.1 mg  0.1 mg Oral BID Gonzella Lex, MD   0.1 mg at 08/14/15 0857  . lamoTRIgine (LAMICTAL) tablet 25 mg  25 mg Oral Daily Gonzella Lex, MD   25 mg at 08/14/15 0857  . magnesium hydroxide (MILK OF MAGNESIA) suspension 30 mL  30 mL Oral Daily PRN Gonzella Lex, MD      . pantoprazole (PROTONIX) EC tablet 40 mg  40 mg Oral Daily Gonzella Lex, MD   40 mg at 08/14/15 0856  . potassium chloride (K-DUR,KLOR-CON) CR tablet 40 mEq  40 mEq Oral BID Gonzella Lex, MD   40 mEq at 08/14/15 0857  . risperiDONE (RISPERDAL) tablet 2 mg  2 mg Oral BID Clovis Fredrickson, MD   2 mg at 08/14/15 0857  . vitamin B-12 (CYANOCOBALAMIN) tablet 1,000 mcg  1,000 mcg Oral Daily Clovis Fredrickson, MD   1,000 mcg at 08/14/15 0858  . white petrolatum (VASELINE) gel   Topical PRN Clovis Fredrickson, MD        Lab Results:  No results found for this or any previous visit (from the past 48 hour(s)).  Blood Alcohol level:  Lab Results  Component Value Date   ETH <5 08/10/2015    Physical Findings: AIMS: Facial and Oral Movements Muscles of Facial Expression: None, normal Lips and Perioral Area: None, normal Jaw: None, normal Tongue:  None, normal,Extremity Movements Upper (arms, wrists, hands, fingers): None, normal Lower (legs, knees, ankles, toes): None, normal, Trunk Movements Neck, shoulders, hips: None, normal, Overall Severity Severity of abnormal movements (highest score from questions above): None, normal Incapacitation due to abnormal movements: None, normal Patient's awareness of abnormal movements (rate only patient's report): No Awareness, Dental Status Current problems with teeth and/or dentures?: No Does patient usually wear dentures?: No  CIWA:    COWS:  Musculoskeletal: Strength & Muscle Tone: within normal limits Gait & Station: normal Patient leans: N/A  Psychiatric Specialty Exam: Physical Exam  Nursing note reviewed. Constitutional: She is oriented to person, place, and time. She appears well-developed and well-nourished.  Respiratory: Effort normal.  Musculoskeletal: Normal range of motion.  Neurological: She is alert and oriented to person, place, and time.    Review of Systems  Constitutional: Negative.   Eyes: Negative.   Respiratory: Negative.   Cardiovascular: Negative.   Gastrointestinal: Negative.   Genitourinary: Negative.   Musculoskeletal: Negative.   Skin: Negative.   Neurological: Negative.   Endo/Heme/Allergies: Negative.   Psychiatric/Behavioral: Negative for depression, suicidal ideas, hallucinations, memory loss and substance abuse. The patient is not nervous/anxious and does not have insomnia.     Blood pressure 123/70, pulse 61, temperature 98.7 F (37.1 C), temperature source Oral, resp. rate 18, height 5\' 1"  (1.549 m), weight 64.864 kg (143 lb), SpO2 100 %.Body mass index is 27.03 kg/(m^2).  General Appearance: Casual  Eye Contact:  Poor  Speech:  Slow  Volume:  Decreased  Mood:  Anxious  Affect:  Blunt  Thought Process:  Linear  Orientation:  Full (Time, Place, and Person)  Thought Content:  Hallucinations: Auditory  Suicidal Thoughts:  No  Homicidal  Thoughts:  No  Memory:  Immediate;   Poor Recent;   Poor Remote;   Poor  Judgement:  Poor  Insight:  Lacking  Psychomotor Activity:  Decreased  Concentration:  Concentration: Poor and Attention Span: Poor  Recall:  Poor  Fund of Knowledge:  Poor  Language:  Poor  Akathisia:  No  Handed:  Right  AIMS (if indicated):     Assets:  Desire for Improvement Housing Resilience Social Support  ADL's:  Intact  Cognition:  WNL  Sleep:  Number of Hours: 8.15     Treatment Plan Summary: Daily contact with patient to assess and evaluate symptoms and progress in treatment and Medication management   Sandra Charles is a 60 year old female with a history of schizoaffective disorder onset neurological problems related to the hospital for auditory hallucinations, paranoia, confusion, and bizarre behaviors disappointed he is in the context of treatment noncompliance with her Depakote level is good.   1. Suicidal and homicidal ideation: According to the chart the patient voiced suicidal or homicidal ideation and threatened to use the gun.  2. Mood. She reportedly has been treated with Seroquel, Xanax, Celexa and Depakote that is now being switched to Lamictal. Ammonia level is not elevated. Will continue Celexa for depression, start Risperdal for psychosis, and continue Lamictal titration.   3. Seizure disorder. We continued Lamictal taht was just started by Dr. Melrose Nakayama. Depakote was discontinued. I ordered EEG as requested by Dr Melrose Nakayama but was unable to get in touch with Dr. Jules Husbands.   4. Hypertension. She is on Norvasc, Coreg, aspirin and clonidine.  5. Hypokalemia. She is on potassium peels. Potassium level is normal.  6. GERD. She is on Protonix.  7. Anxiety. She is on Xanax. We gradually discontinued it given her confusion.  8. Metabolic screening. Lipid profile, TSH, and hemoglobin A1c are normal. Prolactin 9.4.  9. B12 deficiency. We will continue vitamin B12.  10. Disposition. She will  be discharged with her fianc. She will follow up with [facility].  The patient is improving slowly. She is tolerating medications well.   No changes will be made to her medication regimen.  Hildred Priest, MD 08/14/2015, 9:41 AM

## 2015-08-14 NOTE — [facility] Group Notes (Signed)
[facility] Group Notes:  (Nursing/MHT/Case Management/Adjunct)  Date:  08/14/2015  Time:  8:47 PM  Type of Therapy:  Psychoeducational Skills  Participation Level:  Active  Participation Quality:  Appropriate  Affect:  Appropriate  Cognitive:  Appropriate  Insight:  Appropriate and Good  Engagement in Group:  Engaged and Supportive  Modes of Intervention:  Education, Exploration and Support  Summary of Progress/Problems:  Sandra Charles 08/14/2015, 8:47 PM

## 2015-08-14 NOTE — Plan of Care (Signed)
Problem: Activity: Goal: Interest or engagement in activities will improve Outcome: Progressing Patient goal was to shower and change clothes. She did.

## 2015-08-14 NOTE — Progress Notes (Signed)
Patient isolative to room, laid covered up in bed most of the day except for meals and medications. Patient was encouraged to attend groups and interact in the milieu but she refused, stated that she preferred to stay in her room. Patient appeared sad, depressed and preoccupied in mood/affect. Patient took a shower(which was her goal for the day). Will continue to encourage interaction with both staff and peers.

## 2015-08-14 NOTE — [facility] Group Notes (Signed)
[place] LCSW Group Therapy  08/14/2015 2:18 PM  Type of Therapy:  Group Therapy  Participation Level:  Did Not Attend  Modes of Intervention:  Discussion, Education, Socialization and Support  Summary of Progress/Problems: Self esteem: Patients discussed self esteem and how it impacts them. They discussed what aspects in their lives has influenced their self esteem. They were challenged to identify changes that are needed in order to improve self esteem.    [place] MSW, [place]  08/14/2015, 2:18 PM

## 2015-08-14 NOTE — Progress Notes (Signed)
Client presents with a flat/sullen affect; visited  with her son; with little interaction; refused PM snack; took her medications; stayed in the bed most of the evening; staff asked about hygiene; as, she has on the same clothing for the past several days; her son brought in clean clothing. Client states, "I will shower and change the bed in the morning; denies S.I./H.I. When asked Client about having any auditory or visual hallucinations; Client  just stares; will continue to monitor client's status.

## 2015-08-15 MED ORDER — DIVALPROEX SODIUM 500 MG PO DR TAB
500.0000 mg | DELAYED_RELEASE_TABLET | Freq: Three times a day (TID) | ORAL | Status: DC
  Administered 2015-08-15 – 2015-08-18 (×9): 500 mg via ORAL
  Filled 2015-08-15 (×9): qty 1

## 2015-08-15 MED ORDER — FOSFOMYCIN TROMETHAMINE 3 G PO PACK
3.0000 g | PACK | Freq: Once | ORAL | Status: AC
  Administered 2015-08-15: 3 g via ORAL
  Filled 2015-08-15: qty 3

## 2015-08-15 NOTE — Progress Notes (Signed)
D:  Pt is pleasant, confused to time and situation, pt states that she started becoming confused at home before she came to hospital and that she thinks it "is one of the medicines I was on", pt cannot recall the name of the medication, denies SI/HI/AVH.  A:  Emotional support provided, Encouraged pt to continue with treatment plan and attend all group activities, q15 min checks maintained for safety.  R:  Pt is receptive, going to some groups, pleasant and cooperative with staff and other patients on the unit, needs frequent redirection, has trouble remembering where her room is located.

## 2015-08-15 NOTE — Progress Notes (Signed)
D: Pt denies SI/HI/AVH. Pt is pleasant and cooperative, affect is and sad. Patient was noted to have some confusion to place and situation, often redirected as needed. Pt appears less anxious and she is interacting with peers and staff appropriately.  A: Pt was offered support and encouragement. Pt was given scheduled medications. Pt was encouraged to attend groups. Q 15 minute checks were done for safety.  R:Pt attends groups and interacts well with peers and staff. Pt is taking medication. Pt has no complaints.Pt receptive to treatment and safety maintained on unit.

## 2015-08-15 NOTE — Plan of Care (Signed)
Problem: Coping: Goal: Ability to verbalize frustrations and anger appropriately will improve Outcome: Not Progressing Patient not able to discuss coping mechanisms  Or verbalize her frustrations [place] RN

## 2015-08-15 NOTE — Progress Notes (Addendum)
[facility] MD Progress Note  08/15/2015 9:53 AM Sandra Charles  MRN:  [id number]  Subjective:  Sandra Charles did not have a good weekend. She stayed mostly in her room and did not participate in programming. She did not interact with peers and staff. Today however she looks more upbeat and went to the morning group. Sleep and appetite are fair. She has no somatic complaints. She tolerates medications. I spoke with her daughter who wanted to make sure that the patient is still Depakote while cross tapering to Lamictal a doctor Potter's request. We will restart the Depakote.  Principal Problem: Schizoaffective disorder, bipolar type ([place]) Diagnosis:   Patient Active Problem List   Diagnosis Date Noted  . Seizure disorder ([place]) [id number] 08/10/2015  . Cannabis use disorder, moderate, dependence ([place]) [F12.20] 08/10/2015  . Schizoaffective disorder, bipolar type ([place]) [F25.0] 08/10/2015  . Abnormal finding on MRI of brain [R93.0]   . Hypokalemia [E87.6] 07/15/2015  . Confusion [R41.0]   . GERD (gastroesophageal reflux disease) [K21.9] 11/25/2014  . Essential hypertension [I10] 11/25/2014   Total Time spent with patient: 20 minutes  Past Psychiatric History: Schizoaffective disorder.  Past Medical History:  Past Medical History  Diagnosis Date  . Hypertension   . Anxiety   . Depression   . Mini stroke ([place])     "several since 05/2014" (07/15/2015)  . Hyperlipidemia   . GERD (gastroesophageal reflux disease)   . Headache     "weekly" (07/15/2015)  . Seizures ([place]) dx'd 04/2015  . Arthritis     "all over"  . Chronic lower back pain     Past Surgical History  Procedure Laterality Date  . Vaginal hysterectomy    . Tubal ligation    . Dilation and curettage of uterus    . Lumbar puncture  07/14/2015   Family History:  Family History  Problem Relation Age of Onset  . Hyperlipidemia Mother   . Breast cancer Mother 97  . Hypertension Mother   . Heart attack Mother     age 35's  . Breast  cancer Sister 27  . Hyperlipidemia Father   . Hypertension Father   . Heart attack Father 20   Family Psychiatric  History: See H&P. Social History:  History  Alcohol Use  . 0.0 oz/week  . 0 Standard drinks or equivalent per week    Comment: 07/15/2015 "nothing in the last few years"     History  Drug Use No    Social History   Social History  . Marital Status: Single    Spouse Name: N/A  . Number of Children: N/A  . Years of Education: N/A   Occupational History  . disabled    Social History Main Topics  . Smoking status: Former Smoker -- 1.00 packs/day for 5 years    Types: Cigarettes  . Smokeless tobacco: Never Used     Comment: "quit smoking cigarettes in the early 2000s"  . Alcohol Use: 0.0 oz/week    0 Standard drinks or equivalent per week     Comment: 07/15/2015 "nothing in the last few years"  . Drug Use: No  . Sexual Activity: Not Asked   Other Topics Concern  . None   Social History Narrative   Additional Social History:                         Sleep: Fair  Appetite:  Fair  Current Medications: Current Facility-Administered Medications  Medication Dose Route  Frequency Provider Last Rate Last Dose  . acetaminophen (TYLENOL) tablet 650 mg  650 mg Oral Q6H PRN Gonzella Lex, MD      . alum & mag hydroxide-simeth (MAALOX/MYLANTA) 200-200-20 MG/5ML suspension 30 mL  30 mL Oral Q4H PRN Gonzella Lex, MD      . amLODipine (NORVASC) tablet 10 mg  10 mg Oral Daily Gonzella Lex, MD   10 mg at 08/15/15 0921  . aspirin EC tablet 81 mg  81 mg Oral Daily Gonzella Lex, MD   81 mg at 08/15/15 [id number]  . carvedilol (COREG) tablet 25 mg  25 mg Oral BID WC Gonzella Lex, MD   25 mg at 08/14/15 1626  . citalopram (CELEXA) tablet 40 mg  40 mg Oral Daily Gonzella Lex, MD   40 mg at 08/15/15 0921  . cloNIDine (CATAPRES) tablet 0.1 mg  0.1 mg Oral BID Gonzella Lex, MD   0.1 mg at 08/15/15 0923  . fosfomycin (MONUROL) packet 3 g  3 g Oral Once Jolanta B  Pucilowska, MD      . lamoTRIgine (LAMICTAL) tablet 25 mg  25 mg Oral Daily Gonzella Lex, MD   25 mg at 08/15/15 [id number]  . magnesium hydroxide (MILK OF MAGNESIA) suspension 30 mL  30 mL Oral Daily PRN Gonzella Lex, MD      . pantoprazole (PROTONIX) EC tablet 40 mg  40 mg Oral Daily Gonzella Lex, MD   40 mg at 08/15/15 [id number]  . potassium chloride (K-DUR,KLOR-CON) CR tablet 40 mEq  40 mEq Oral BID Gonzella Lex, MD   40 mEq at 08/15/15 0921  . risperiDONE (RISPERDAL) tablet 2 mg  2 mg Oral BID Clovis Fredrickson, MD   2 mg at 08/15/15 0922  . vitamin B-12 (CYANOCOBALAMIN) tablet 1,000 mcg  1,000 mcg Oral Daily Clovis Fredrickson, MD   1,000 mcg at 08/15/15 0921  . white petrolatum (VASELINE) gel   Topical PRN Clovis Fredrickson, MD        Lab Results: No results found for this or any previous visit (from the past 48 hour(s)).  Blood Alcohol level:  Lab Results  Component Value Date   ETH <5 08/10/2015    Physical Findings: AIMS: Facial and Oral Movements Muscles of Facial Expression: None, normal Lips and Perioral Area: None, normal Jaw: None, normal Tongue: None, normal,Extremity Movements Upper (arms, wrists, hands, fingers): None, normal Lower (legs, knees, ankles, toes): None, normal, Trunk Movements Neck, shoulders, hips: None, normal, Overall Severity Severity of abnormal movements (highest score from questions above): None, normal Incapacitation due to abnormal movements: None, normal Patient's awareness of abnormal movements (rate only patient's report): No Awareness, Dental Status Current problems with teeth and/or dentures?: No Does patient usually wear dentures?: No  CIWA:    COWS:     Musculoskeletal: Strength & Muscle Tone: within normal limits Gait & Station: normal Patient leans: N/A  Psychiatric Specialty Exam: Physical Exam  Nursing note and vitals reviewed.   Review of Systems  Psychiatric/Behavioral: Positive for substance abuse.  All other  systems reviewed and are negative.   Blood pressure 115/53, pulse 57, temperature 98.3 F (36.8 C), temperature source Oral, resp. rate 16, height 5\' 1"  (1.549 m), weight 64.864 kg (143 lb), SpO2 100 %.Body mass index is 27.03 kg/(m^2).  General Appearance: Casual  Eye Contact:  Good  Speech:  Slow  Volume:  Normal  Mood:  Anxious  Affect:  Congruent  Thought Process:  Disorganized  Orientation:  Full (Time, Place, and Person)  Thought Content:  Illogical  Suicidal Thoughts:  No  Homicidal Thoughts:  No  Memory:  Immediate;   Fair Recent;   Fair Remote;   Fair  Judgement:  Poor  Insight:  Lacking  Psychomotor Activity:  Psychomotor Retardation  Concentration:  Concentration: Poor and Attention Span: Poor  Recall:  Poor  Fund of Knowledge:  Poor  Language:  Poor  Akathisia:  No  Handed:  Right  AIMS (if indicated):     Assets:  Communication Skills Desire for Improvement Financial Resources/Insurance Housing Physical Health Resilience Social Support  ADL's:  Intact  Cognition:  WNL  Sleep:  Number of Hours: 8.15     Treatment Plan Summary: Daily contact with patient to assess and evaluate symptoms and progress in treatment and Medication management   Ms. Borsch is a 60 year old female with a history of schizoaffective disorder and new onset neurological problems was admitted to the hospital for auditory hallucinations, paranoia, confusion, and bizarre behaviors in the context of presumed treatment noncompliance but good Depakote level.   1. Suicidal and homicidal ideation: According to the chart the patient voiced suicidal or homicidal ideation and threatened to use the gun.  2. Mood. She reportedly has been treated with Seroquel, Xanax, Celexa and Depakote that is now being switched to Lamictal. Ammonia level is not elevated. Will continue Celexa for depression, start Risperdal for psychosis.   3. Seizure disorder. We continued Lamictal titration that was just  started by Dr. Melrose Nakayama. EEG was completed.  We restarted Depakote. Level on 6/22.  4. Hypertension. She is on Norvasc, Coreg, aspirin and clonidine.  5. Hypokalemia. She is on potassium pils. Potassium level is normal.  6. GERD. She is on Protonix.  7. Anxiety. She was on Xanax. We gradually discontinued it given her confusion.  8. Metabolic screening. Lipid profile, TSH, and hemoglobin A1c are normal. Prolactin 9.4.  9. B12 deficiency. We will continue vitamin B12.  10. UTI. Urine is dirty. There was no significant growth when cultured. We offer fosfomycin.   11. Disposition. She will be discharged with her daughter. She will follow up with [facility].  Orson Slick, MD 08/15/2015, 9:53 AM

## 2015-08-15 NOTE — Progress Notes (Signed)
Recreation Therapy Notes  Date: 06.19.17 Time: 9:30 am Location: Craft Room  Group Topic: Self-expression  Goal Area(s) Addresses:  Patient will identify one color per emotion listed on wheel. Patient will verbalize one emotion experienced during session. Patient will be educated on other forms of self-expression.  Behavioral Response: Attentive, Interactive  Intervention: Emotion Wheel  Activity: Patients were given a worksheet with seven emotions on it and were instructed to pick a color for each emotion.  Education: LRT educated patients on other forms of self-expression.  Education Outcome: Acknowledges education/In group clarification offered  Clinical Observations/Feedback: Patient completed activity by picking a color for each emotion. Patient contributed to group discussion by stating what colors she picked for certain emotions and why, and that it felt good to see her emotions on paper.  Leonette Monarch, LRT/CTRS 08/15/2015 10:15 AM

## 2015-08-15 NOTE — BHH Suicide Risk Assessment (Signed)
[place] INPATIENT:  Family/Significant Other Suicide Prevention Education  Suicide Prevention Education:  Education Completed; Sandra Charles (ph#: [phone] has been identified by the patient as the family member/signicant other with whom the patient will be residing, and identified as the person(s) who will aid the patient in the event of a mental health crisis (suicidal ideations/suicide attempt).  With written consent from the patient, the family member/significant other has been provided the following suicide prevention education, prior to the and/or following the discharge of the patient. Son, Sandra Charles was concerned with pt's recent suicidal thoughts. He stated that mother has stated twice before admission that she would "kill herself." She denies SI/HI at this time, and reports she does not remember saying anything about suicide. CSW reviewed warning signs, contact information, etc. with son and pt.  The suicide prevention education provided includes the following:  Suicide risk factors  Suicide prevention and interventions  National Suicide Hotline telephone number  [facility] assessment telephone number  [facility] Emergency Assistance [place] and/or Residential Mobile Crisis Unit telephone number  Request made of family/significant other to:  Remove weapons (e.g., guns, rifles, knives), all items previously/currently identified as safety concern.    Remove drugs/medications (over-the-counter, prescriptions, illicit drugs), all items previously/currently identified as a safety concern.  The family member/significant other verbalizes understanding of the suicide prevention education information provided.  The family member/significant other agrees to remove the items of safety concern listed above.  Sandra Charles 08/15/2015, 10:02 AM

## 2015-08-15 NOTE — BHH Group Notes (Signed)
[place] Group Notes:  (Nursing/MHT/Case Management/Adjunct)  Date:  08/15/2015  Time:  5:31 PM  Type of Therapy:  Psychoeducational Skills  Participation Level:  Minimal  Participation Quality:  Appropriate  Affect:  Flat  Cognitive:  Appropriate  Insight:  Appropriate  Engagement in Group:  Poor  Modes of Intervention:  Discussion, Education and Support  Summary of Progress/Problems:  Adela Lank [facility] 08/15/2015, 5:31 PM

## 2015-08-15 NOTE — Progress Notes (Signed)
D: Patient is alert and oriented  To person only on the unit this shift. Patient frequently requires redirection . Patient attended   Group today. Patient denies suicidal ideation, homicidal ideation, auditory or visual hallucinations at the present time.  A: Scheduled medications are administered to patient as per MD orders. Emotional support and encouragement are provided. Patient is maintained on q.15 minute safety checks. Patient is informed to notify staff with questions or concerns. R: No adverse medication reactions are noted. Patient is cooperative with medication administration and treatment plan today. Patient is receptive, calm and cooperative but anxious at times on the unit at this time. Patient is isolative most of day and does not interact with others  this shift. Patient contracts for safety at this time. Patient remains safe at this time.

## 2015-08-16 NOTE — BHH Counselor (Signed)
[place] LCSW Group Therapy  08/16/2015 2:00 PM  Type of Therapy:  Group Therapy  Participation Level:  Active  Participation Quality:  Attentive  Affect:  Appropriate  Cognitive:  Appropriate  Insight:  Developing/Improving  Engagement in Therapy:  Developing/Improving  Modes of Intervention:  Discussion, Education, Exploration and Support  Summary of Progress/Problems:LCSW introduced self and encouraged group members to introduce themselves reviewed group rules. Today's group was on " My diagnosis" and patients encouraged to understand their symptoms, risk factors, strengths and what to do when patients were experiencing symptoms. Pt was  able to share her thoughts and feelings. Her peers were able to relate and support and suggest good ideas to assist patients when they are feeling sad,depressed or suicidal or manic or anxious. Ideas shared encompassed talking to doctor, staying on medications, get a therapist, attend groups increase exercise and reduce stress and anxiety. This patient was very kind, compassionate to her peers and she was able to reflect openly to peers.  Sandra Charles, Sandra Charles 08/16/2015, 2:00 PM

## 2015-08-16 NOTE — Progress Notes (Signed)
Patient with blunted affect, cooperative behavior with meals, meds and plan of care. No SI/HI/AVH at this time. Patient eating am meal with female peers. Remains slightly confused. Encouraged daily goal sheet patient unwilling to complete.

## 2015-08-16 NOTE — BHH Group Notes (Signed)
[place] Group Notes:  (Nursing/MHT/Case Management/Adjunct)  Date:  08/16/2015  Time:  5:49 PM  Type of Therapy:  Psychoeducational Skills  Participation Level:  Did Not Attend   Adela Lank [facility] 08/16/2015, 5:49 PM

## 2015-08-16 NOTE — BHH Group Notes (Signed)
[facility] LCSW Aftercare Discharge Planning Group Note  08/16/2015 10:47 AM  Participation Quality:  Appropriate  Affect:  Appropriate  Cognitive:  Appropriate  Insight:  Developing/Improving  Engagement in Group:  Developing/Improving  Modes of Intervention:  Discussion, Education and Exploration  Summary of Progress/Problems:LCSW introduced self and encouraged group members to introduce themselves and to select a SMART goal one that is Specific, measurable,attainable,relevant and time bound. Discharge process was explained to patients and all questions, were answered. This patient was pleasant and supportive of their peers and has the Smart goal for today is to meet with psychiatrist, social worker and discuss discharge plan.  Enis Slipper M 08/16/2015, 10:47 AM

## 2015-08-16 NOTE — Plan of Care (Signed)
Problem: Education: Goal: Ability to state activities that reduce stress will improve Outcome: Not Progressing Patient remains somewhat confused and unable to name coping skills that help.

## 2015-08-16 NOTE — Progress Notes (Signed)
Recreation Therapy Notes  Date: 6.20.17 Time: 9:30 am Location: Craft Room  Group Topic: Goal Setting  Goal Area(s) Addresses:  Patient will identify one goal. Patient will identify at least one supportive statement. Patient will verbalize benefit of setting goals.  Behavioral Response: Attentive, Interactive  Intervention: Step By Step  Activity: Patients were given a foot worksheet and instructed to write their goal inside the food. On the outside of the foot, patients were instructed to write supportive statements.  Education: LRT educated patients on healthy ways they can celebrate reaching their goals.   Education Outcome: Acknowledges education/In group clarification offered  Clinical Observations/Feedback: Patient wrote goal and supportive statements. Patient contributed to group discussion by stating that it was a little difficult to think of supportive statements and why.  Leonette Monarch, LRT/CTRS 08/16/2015 10:16 AM

## 2015-08-17 DIAGNOSIS — N390 Urinary tract infection, site not specified: Secondary | ICD-10-CM | POA: Diagnosis present

## 2015-08-17 LAB — VALPROIC ACID LEVEL: Valproic Acid Lvl: 90 ug/mL (ref 50.0–100.0)

## 2015-08-17 MED ORDER — LAMOTRIGINE 25 MG PO TABS: 25.0000 mg | ORAL_TABLET | Freq: Every day | ORAL | Status: DC

## 2015-08-17 MED ORDER — RISPERIDONE 2 MG PO TABS: 2.0000 mg | ORAL_TABLET | Freq: Two times a day (BID) | ORAL | Status: DC

## 2015-08-17 NOTE — Plan of Care (Signed)
Problem: Coping: Goal: Ability to verbalize frustrations and anger appropriately will improve Outcome: Not Applicable Date Met:  40/90/50 Pt remains confused. Pleasant and cooperative. Med compliant. Denies SI/HI/AVH. No behavior issues noted. q 15 min checks maintained for safety.

## 2015-08-17 NOTE — Plan of Care (Signed)
Problem: Activity: Goal: Interest or engagement in activities will improve Outcome: Progressing Patient  Doing routine personal  Chores

## 2015-08-17 NOTE — Discharge Summary (Signed)
Physician Discharge Summary Note  Patient:  Sandra Charles is an 60 y.o., female MRN:  [id number] DOB:  [birth year] Patient phone:  [phone] (home)  Patient address:   [address] [place] [address],  Total Time spent with patient: 30 minutes  Date of Admission:  08/10/2015 Date of Discharge: 08/18/2015  Reason for Admission:  Confusion, psychosis, suicidal ideation.  Identifying data. Sandra Charles is a 60 year old female with history of schizoaffective disorder and seizures.  Chief complaint. The patient is unable to state.  History of present illness. Information was obtained from the patient and the chart. The patient is an extremely poor historian. She is unable to provide any information about the circumstances of her admission. She doesn't know brought her to the hospital and why. She tells me that she just woke up in the hospital this morning. The patient herself does not have any complaints although she believes that she might be depressed. She admits to feeling paranoid and having auditory hallucinations. He denies that she's been seeing a psychiatrist lately. She denies that she's been taking any psychotropic medications. According to the chart she should be taking Celexa and Seroquel. She is also on Depakote prescribed by her neurologist for seizures and Depakote level is therapeutic indicating proper compliance. According to the family the patient started behaving strangely, experiencing auditory hallucinations, threatening to shoot herself and her fianc with a gun, having episodes of confusion. Reportedly there is a gun in the house but no ammunition available. About a month ago the patient was admitted to the hospital for an episode of seizures. She is still under investigation. Apparently her Depakote is now being changed to Lamictal. The patient denies any thoughts of anxiety or symptoms suggestive of bipolar mania. She denies alcohol use but admits to occasional marijuana  smoking. She denies prescription pill abuse. Unfortunately Urine tox screen was not done on admission.  Past psychiatric history. Reportedly she was diagnosed with schizoaffective disorder bipolar type. The patient reports one hospitalization at [facility] several years ago. According to the family she should be taking Seroquel and Celexa. In reviewing her chart and noticed that she has been prescribed Celexa, Xanax, Depakote, and Lamictal from [facility]. In addition to Xanax narcotic painkillers also on her list. The patient denies ever attempting suicide.  Family psychiatric history. The patient reports that her mother struggled with depression.  Social history. She lives with her fianc. She apparently has not been employed for at least a year but at times believes that she is still working. She has Medicaid. Her son and her niece live close by and are supportive.  Principal Problem: Schizoaffective disorder, bipolar type [facility]) Discharge Diagnoses: Patient Active Problem List   Diagnosis Date Noted  . UTI (urinary tract infection) [N39.0] 08/17/2015  . Seizure disorder ([place]) [id number] 08/10/2015  . Cannabis use disorder, moderate, dependence ([place]) [F12.20] 08/10/2015  . Schizoaffective disorder, bipolar type ([place]) [F25.0] 08/10/2015  . Abnormal finding on MRI of brain [R93.0]   . Hypokalemia [E87.6] 07/15/2015  . Confusion [R41.0]   . GERD (gastroesophageal reflux disease) [K21.9] 11/25/2014  . Essential hypertension [I10] 11/25/2014    Past Medical History:  Past Medical History  Diagnosis Date  . Hypertension   . Anxiety   . Depression   . Mini stroke ([place])     "several since 05/2014" (07/15/2015)  . Hyperlipidemia   . GERD (gastroesophageal reflux disease)   . Headache     "weekly" (07/15/2015)  . Seizures ([place]) dx'd 04/2015  .  Arthritis     "all over"  . Chronic lower back pain     Past Surgical History  Procedure Laterality Date  . Vaginal hysterectomy    . Tubal  ligation    . Dilation and curettage of uterus    . Lumbar puncture  07/14/2015   Family History:  Family History  Problem Relation Age of Onset  . Hyperlipidemia Mother   . Breast cancer Mother 81  . Hypertension Mother   . Heart attack Mother     age 65's  . Breast cancer Sister 52  . Hyperlipidemia Father   . Hypertension Father   . Heart attack Father 41   Social History:  History  Alcohol Use  . 0.0 oz/week  . 0 Standard drinks or equivalent per week    Comment: 07/15/2015 "nothing in the last few years"     History  Drug Use No    Social History   Social History  . Marital Status: Single    Spouse Name: N/A  . Number of Children: N/A  . Years of Education: N/A   Occupational History  . disabled    Social History Main Topics  . Smoking status: Former Smoker -- 1.00 packs/day for 5 years    Types: Cigarettes  . Smokeless tobacco: Never Used     Comment: "quit smoking cigarettes in the early 2000s"  . Alcohol Use: 0.0 oz/week    0 Standard drinks or equivalent per week     Comment: 07/15/2015 "nothing in the last few years"  . Drug Use: No  . Sexual Activity: Not Asked   Other Topics Concern  . None   Social History Narrative    Hospital Course:    Sandra Charles is a 60 year old female with a history of schizoaffective disorder and new onset neurological problems was admitted to the hospital for auditory hallucinations, paranoia, confusion, and bizarre behaviors in the context of presumed treatment noncompliance but good Depakote level.   1. Suicidal and homicidal ideation. This has resolved. She is able to contract for safety. She is forward thinking and optimistic about the future.   2. Mood. She reportedly was treated with Seroquel, Xanax, Celexa in the past. We continued Celexa for depression and started Risperdal for psychosis.   3. Seizure disorder. We continued Depakote, VPA 93, and Lamictal titration that was just started by Sandra Charles. EEG was  completed.    4. Hypertension. She is on Norvasc, Coreg, aspirin and clonidine.  5. Hypokalemia. Resolved.   6. GERD. She is on Protonix.  7. Anxiety. We gradually discontinued Xanax.   8. Metabolic screening. Lipid profile, TSH, and hemoglobin A1c are normal. Prolactin 9.4.  9. B12 deficiency. We will continue vitamin B12.  10. UTI.She was treated with fosfomycin.   11. Disposition. She was discharged with her daughter. She will follow up with [facility].  Physical Findings: AIMS: Facial and Oral Movements Muscles of Facial Expression: None, normal Lips and Perioral Area: None, normal Jaw: None, normal Tongue: None, normal,Extremity Movements Upper (arms, wrists, hands, fingers): None, normal Lower (legs, knees, ankles, toes): None, normal, Trunk Movements Neck, shoulders, hips: None, normal, Overall Severity Severity of abnormal movements (highest score from questions above): None, normal Incapacitation due to abnormal movements: None, normal Patient's awareness of abnormal movements (rate only patient's report): No Awareness, Dental Status Current problems with teeth and/or dentures?: No Does patient usually wear dentures?: No  CIWA:    COWS:     Musculoskeletal: Strength &  Muscle Tone: within normal limits Gait & Station: normal Patient leans: N/A  Psychiatric Specialty Exam: Physical Exam  Nursing note and vitals reviewed.   Review of Systems  Psychiatric/Behavioral: Positive for hallucinations, memory loss and substance abuse.  All other systems reviewed and are negative.   Blood pressure 122/49, pulse 80, temperature 98.5 F (36.9 C), temperature source Oral, resp. rate 16, height 5\' 1"  (1.549 m), weight 64.864 kg (143 lb), SpO2 100 %.Body mass index is 27.03 kg/(m^2).  See SRA.                                                  Sleep:  Number of Hours: 6.45     Have you used any form of tobacco in the last 30 days?  (Cigarettes, Smokeless Tobacco, Cigars, and/or Pipes): No  Has this patient used any form of tobacco in the last 30 days? (Cigarettes, Smokeless Tobacco, Cigars, and/or Pipes) Yes, Yes, A prescription for an FDA-approved tobacco cessation medication was offered at discharge and the patient refused  Blood Alcohol level:  Lab Results  Component Value Date   [facility] <5 [id number]    Metabolic Disorder Labs:  Lab Results  Component Value Date   HGBA1C 6.1* 08/10/2015   Lab Results  Component Value Date   PROLACTIN 9.4 08/10/2015   PROLACTIN 7.1 07/12/2015   Lab Results  Component Value Date   CHOL 108 08/10/2015   TRIG 92 08/10/2015   HDL 42 08/10/2015   CHOLHDL 2.6 08/10/2015   VLDL 18 08/10/2015   LDLCALC 48 08/10/2015    See Psychiatric Specialty Exam and Suicide Risk Assessment completed by Attending Physician prior to discharge.  Discharge destination:  Home  Is patient on multiple antipsychotic therapies at discharge:  No   Has Patient had three or more failed trials of antipsychotic monotherapy by history:  No  Recommended Plan for Multiple Antipsychotic Therapies: NA  Discharge Instructions    Diet - low sodium heart healthy    Complete by:  As directed      Increase activity slowly    Complete by:  As directed             Medication List    STOP taking these medications        ALPRAZolam 1 MG tablet  Commonly known as:  XANAX     cefUROXime 500 MG tablet  Commonly known as:  CEFTIN     HYDROcodone-acetaminophen 5-325 MG tablet  Commonly known as:  NORCO      TAKE these medications      Indication   amLODipine 10 MG tablet  Commonly known as:  NORVASC  Take 10 mg by mouth daily.      aspirin EC 81 MG tablet  Take 81 mg by mouth daily.      carvedilol 25 MG tablet  Commonly known as:  COREG  Take 25 mg by mouth 2 (two) times daily with a meal.      citalopram 40 MG tablet  Commonly known as:  CELEXA  Take 40 mg by mouth daily.       cloNIDine 0.1 MG tablet  Commonly known as:  CATAPRES  Take 1 tablet (0.1 mg total) by mouth 2 (two) times daily.      cyanocobalamin 1000 MCG/ML injection  Commonly known as:  (VITAMIN B-12)  Inject 1 mL (1,000 mcg total) into the muscle daily.      divalproex 500 MG DR tablet  Commonly known as:  DEPAKOTE  Take 1 tablet (500 mg total) by mouth every 8 (eight) hours.      lamoTRIgine 25 MG tablet  Commonly known as:  LAMICTAL  Take 1 tablet (25 mg total) by mouth daily.   Indication:  [place] Seizures, Manic-Depression     pantoprazole 40 MG tablet  Commonly known as:  PROTONIX  Take 1 tablet (40 mg total) by mouth daily.      risperiDONE 2 MG tablet  Commonly known as:  RISPERDAL  Take 1 tablet (2 mg total) by mouth 2 (two) times daily.   Indication:  Manic-Depression     Vitamin D3 5000 units Tabs  Take 5,000 Units by mouth daily.            Follow-up Information    Follow up with [facility]. Go on 08/22/2015.   Why:  Please arrive to your appointment before scheduled time at 9:00AM. for therapy.  Arrive as early as possible for prompt service.   Contact information:   [facility] [address] [place], [place] [address] [phone]       Follow-up recommendations:  Activity:  as tolerated. Diet:  low sodium heart healthy. Other:  keep follow up appointment.  Comments:    Signed: Orson Slick, MD 08/17/2015, 8:14 PM

## 2015-08-17 NOTE — Tx Team (Signed)
Interdisciplinary Treatment Plan Update (Adult)  Date:  08/17/2015 Time Reviewed:  10:35 AM  Progress in Treatment: Attending groups: Yes Participating in groups:  Yes Taking medication as prescribed:  Yes. Tolerating medication:  Yes. Family/Significant othe contact made:  Yes, CSW spoke with son, [name] and niece, Sandra Charles  Patient understands diagnosis:  Yes, more insight than at admission. Discussing patient identified problems/goals with staff:  Yes. Medical problems stabilized or resolved:  Yes. Denies suicidal/homicidal ideation: Yes. Issues/concerns per patient self-inventory:  Yes. Other:  New problem(s) identified: No, Describe:  NA  Discharge Plan or Barriers: Pt plans to return home and follow up with outpatient. Has an appointment scheduled for Monday, 08/22/15 at 9am.   Reason for Continuation of Hospitalization: Hallucinations Medical Issues Medication stabilization Other; describe [place]  Comments:The patient is an extremely poor historian. She is unable to provide any information about the circumstances of her admission. She doesn't know brought her to the hospital and why. She tells me that she just woke up in the hospital this morning. The patient herself does not have any complaints although she believes that she might be depressed. She admits to feeling paranoid and having auditory hallucinations. He denies that she's been seeing a psychiatrist lately. She denies that she's been taking any psychotropic medications. According to the chart she should be taking Celexa and Seroquel. She is also on Depakote prescribed by her neurologist for seizures and Depakote level is therapeutic indicating proper compliance. According to the family the patient started behaving strangely, experiencing auditory hallucinations, threatening to shoot herself and her fianc with a gun, having episodes of confusion. Reportedly there is a gun in the house but no ammunition available. About a  month ago the patient was admitted to the hospital for an episode of seizures. She is still under investigation. Apparently her Depakote is now being changed to Lamictal. The patient denies any thoughts of anxiety or symptoms suggestive of bipolar mania. She denies alcohol use but admits to occasional marijuana smoking. She denies prescription pill abuse. Unfortunately Urine tox screen was not done on admission.  Estimated discharge date: 08/18/2015   New goal(s): NA  Review of initial/current patient goals per problem list:   1.  Goal(s): Patient will participate in aftercare plan * Met: Yes * Target date: at discharge * As evidenced by: Patient will participate within aftercare plan AEB aftercare provider and housing plan at discharge being identified. 08/17/2015: Patient's niece, Sandra Charles set an appointment with [facility]  2.  Goal (s): Patient will demonstrate decreased symptoms of psychosis. * Met: Yes *  Target date: at discharge * As evidenced by: Patient will not endorse signs of psychosis or be deemed stable for discharge by MD.  08/17/15: Adequate for discharge per MD. Pt denies SI/HI and symptoms of psychosis.  Pt reports he is safe for discharge.    Attendees: Patient:  Sandra Charles 6/21/201710:35 AM  Family:   6/21/201710:35 AM  Physician:   Dr. Bary Leriche  6/21/201710:35 AM  Nursing:   Floyde Parkins, RN 6/21/201710:35 AM  Case Manager:   6/21/201710:35 AM  Counselor:   6/21/201710:35 AM  Other:  Emilie Rutter, [place] 6/21/201710:35 AM  Other:  Everitt Amber, LRT  6/21/201710:35 AM  Other:   6/21/201710:35 AM  Other:  6/21/201710:35 AM  Other:  6/21/201710:35 AM  Other:  6/21/201710:35 AM  Other:  6/21/201710:35 AM  Other:  6/21/201710:35 AM  Other:  6/21/201710:35 AM  Other:   6/21/201710:35 AM   Scribe for Treatment  Team:   Emilie Rutter, MSW, [place]  08/17/2015, 10:35 AM

## 2015-08-17 NOTE — Plan of Care (Signed)
Problem: Coping: Goal: Ability to identify and develop effective coping behavior will improve Outcome: Progressing patient states she has coping mechanisms in place such as go to room to be aloine or redirections [employer]

## 2015-08-17 NOTE — [facility] Group Notes (Signed)
[place] Group Notes:  (Nursing/MHT/Case Management/Adjunct)  Date:  08/17/2015  Time:  9:40 PM  Type of Therapy:  Group Therapy  Participation Level:  Active  Participation Quality:  Appropriate  Affect:  Appropriate  Cognitive:  Appropriate  Insight:  Appropriate  Engagement in Group:  Engaged  Modes of Intervention:  Discussion  Summary of Progress/Problems:  Kandis Fantasia 08/17/2015, 9:40 PM

## 2015-08-17 NOTE — Progress Notes (Signed)
D: Patient is alert and oriented   To person on the unit this shift. Patient attended  in groups today. Patient denies suicidal ideation, homicidal ideation, auditory or visual hallucinations at the present time.  A: Scheduled medications are administered to patient as per MD orders. Emotional support and encouragement are provided. Patient is maintained on q.15 minute safety checks. Patient is informed to notify staff with questions or concerns. R: No adverse medication reactions are noted. Patient is cooperative with medication administration and treatment plan today. Patient is calm and cooperative on the unit at this time.Patient is confused all the timie to where her room is on unit this shift . Patient does not interact  with others on the unit this shift. Patient contracts for safety at this time. Patient remains safe at this time.

## 2015-08-17 NOTE — Plan of Care (Signed)
Problem: Safety: Goal: Periods of time without injury will increase Outcome: Progressing Patient progressing by having no falls on this unit at [facility] time   [employer]

## 2015-08-17 NOTE — Progress Notes (Addendum)
[facility] MD Progress Note  08/17/2015 5:59 AM CECELIE Charles  MRN:  [id number]  Subjective:  Sandra Charles has no complaints. She denies any symptoms of depression, anxiety or psychosis. She seems much less confused after we discontinued benzodiazepines and treated UTI. She is now on both Depakote and lithium. We'll check lithium level tomorrow morning. There are no somatic complaints. The patient attempts to participate in groups.  Principal Problem: Schizoaffective disorder, bipolar type ([place]) Diagnosis:   Patient Active Problem List   Diagnosis Date Noted  . Seizure disorder ([place]) [id number] 08/10/2015  . Cannabis use disorder, moderate, dependence ([place]) [F12.20] 08/10/2015  . Schizoaffective disorder, bipolar type ([place]) [F25.0] 08/10/2015  . Abnormal finding on MRI of brain [R93.0]   . Hypokalemia [E87.6] 07/15/2015  . Confusion [R41.0]   . GERD (gastroesophageal reflux disease) [K21.9] 11/25/2014  . Essential hypertension [I10] 11/25/2014   Total Time spent with patient: 20 minutes  Past Psychiatric History: Schizoaffective disorder.  Past Medical History:  Past Medical History  Diagnosis Date  . Hypertension   . Anxiety   . Depression   . Mini stroke ([place])     "several since 05/2014" (07/15/2015)  . Hyperlipidemia   . GERD (gastroesophageal reflux disease)   . Headache     "weekly" (07/15/2015)  . Seizures ([place]) dx'd 04/2015  . Arthritis     "all over"  . Chronic lower back pain     Past Surgical History  Procedure Laterality Date  . Vaginal hysterectomy    . Tubal ligation    . Dilation and curettage of uterus    . Lumbar puncture  07/14/2015   Family History:  Family History  Problem Relation Age of Onset  . Hyperlipidemia Mother   . Breast cancer Mother 59  . Hypertension Mother   . Heart attack Mother     age 4's  . Breast cancer Sister 36  . Hyperlipidemia Father   . Hypertension Father   . Heart attack Father 39   Family Psychiatric  History: See H&P. Social  History:  History  Alcohol Use  . 0.0 oz/week  . 0 Standard drinks or equivalent per week    Comment: 07/15/2015 "nothing in the last few years"     History  Drug Use No    Social History   Social History  . Marital Status: Single    Spouse Name: N/A  . Number of Children: N/A  . Years of Education: N/A   Occupational History  . disabled    Social History Main Topics  . Smoking status: Former Smoker -- 1.00 packs/day for 5 years    Types: Cigarettes  . Smokeless tobacco: Never Used     Comment: "quit smoking cigarettes in the early 2000s"  . Alcohol Use: 0.0 oz/week    0 Standard drinks or equivalent per week     Comment: 07/15/2015 "nothing in the last few years"  . Drug Use: No  . Sexual Activity: Not Asked   Other Topics Concern  . None   Social History Narrative   Additional Social History:                         Sleep: Fair  Appetite:  Fair  Current Medications: Current Facility-Administered Medications  Medication Dose Route Frequency Provider Last Rate Last Dose  . acetaminophen (TYLENOL) tablet 650 mg  650 mg Oral Q6H PRN Gonzella Lex, MD      . alum &  mag hydroxide-simeth (MAALOX/MYLANTA) 200-200-20 MG/5ML suspension 30 mL  30 mL Oral Q4H PRN Gonzella Lex, MD      . amLODipine (NORVASC) tablet 10 mg  10 mg Oral Daily Gonzella Lex, MD   10 mg at 08/16/15 0846  . aspirin EC tablet 81 mg  81 mg Oral Daily Gonzella Lex, MD   81 mg at 08/16/15 0847  . carvedilol (COREG) tablet 25 mg  25 mg Oral BID WC Gonzella Lex, MD   25 mg at 08/16/15 1734  . citalopram (CELEXA) tablet 40 mg  40 mg Oral Daily Gonzella Lex, MD   40 mg at 08/16/15 0845  . cloNIDine (CATAPRES) tablet 0.1 mg  0.1 mg Oral BID Gonzella Lex, MD   0.1 mg at 08/16/15 2122  . divalproex (DEPAKOTE) DR tablet 500 mg  500 mg Oral Q8H Wyland Rastetter B Lasaro Primm, MD   500 mg at 08/16/15 2123  . lamoTRIgine (LAMICTAL) tablet 25 mg  25 mg Oral Daily Gonzella Lex, MD   25 mg at 08/16/15  0846  . magnesium hydroxide (MILK OF MAGNESIA) suspension 30 mL  30 mL Oral Daily PRN Gonzella Lex, MD      . pantoprazole (PROTONIX) EC tablet 40 mg  40 mg Oral Daily Gonzella Lex, MD   40 mg at 08/16/15 0847  . potassium chloride (K-DUR,KLOR-CON) CR tablet 40 mEq  40 mEq Oral BID Gonzella Lex, MD   40 mEq at 08/16/15 2123  . risperiDONE (RISPERDAL) tablet 2 mg  2 mg Oral BID Clovis Fredrickson, MD   2 mg at 08/16/15 2123  . vitamin B-12 (CYANOCOBALAMIN) tablet 1,000 mcg  1,000 mcg Oral Daily Clovis Fredrickson, MD   1,000 mcg at 08/16/15 0847  . white petrolatum (VASELINE) gel   Topical PRN Clovis Fredrickson, MD        Lab Results: No results found for this or any previous visit (from the past 48 hour(s)).  Blood Alcohol level:  Lab Results  Component Value Date   ETH <5 08/10/2015    Physical Findings: AIMS: Facial and Oral Movements Muscles of Facial Expression: None, normal Lips and Perioral Area: None, normal Jaw: None, normal Tongue: None, normal,Extremity Movements Upper (arms, wrists, hands, fingers): None, normal Lower (legs, knees, ankles, toes): None, normal, Trunk Movements Neck, shoulders, hips: None, normal, Overall Severity Severity of abnormal movements (highest score from questions above): None, normal Incapacitation due to abnormal movements: None, normal Patient's awareness of abnormal movements (rate only patient's report): No Awareness, Dental Status Current problems with teeth and/or dentures?: No Does patient usually wear dentures?: No  CIWA:    COWS:     Musculoskeletal: Strength & Muscle Tone: within normal limits Gait & Station: normal Patient leans: N/A  Psychiatric Specialty Exam: Physical Exam  Nursing note and vitals reviewed.   Review of Systems  Psychiatric/Behavioral: Positive for depression and hallucinations.  All other systems reviewed and are negative.   Blood pressure 140/69, pulse 73, temperature 98.5 F (36.9 C),  temperature source Oral, resp. rate 18, height 5\' 1"  (1.549 m), weight 64.864 kg (143 lb), SpO2 100 %.Body mass index is 27.03 kg/(m^2).  General Appearance: Casual  Eye Contact:  Good  Speech:  Clear and Coherent  Volume:  Normal  Mood:  Anxious  Affect:  Appropriate  Thought Process:  Goal Directed  Orientation:  Full (Time, Place, and Person)  Thought Content:  WDL  Suicidal Thoughts:  No  Homicidal Thoughts:  No  Memory:  Immediate;   Fair Recent;   Fair Remote;   Fair  Judgement:  Impaired  Insight:  Shallow  Psychomotor Activity:  Normal  Concentration:  Concentration: Fair and Attention Span: Fair  Recall:  [employer] of Knowledge:  Fair  Language:  Fair  Akathisia:  No  Handed:  Right  AIMS (if indicated):     Assets:  Communication Skills Desire for Improvement Financial Resources/Insurance Housing Resilience Social Support  ADL's:  Intact  Cognition:  WNL  Sleep:  Number of Hours: 6.45     Treatment Plan Summary: Daily contact with patient to assess and evaluate symptoms and progress in treatment and Medication management   Ms. Heitkamp is a 60 year old female with a history of schizoaffective disorder and new onset neurological problems was admitted to the hospital for auditory hallucinations, paranoia, confusion, and bizarre behaviors in the context of presumed treatment noncompliance but good Depakote level.   1. Suicidal and homicidal ideation: According to the chart the patient voiced suicidal or homicidal ideation and threatened to use the gun.  2. Mood. She reportedly has been treated with Seroquel, Xanax, Celexa and Depakote that is now being switched to Lamictal. Ammonia level is not elevated. Will continue Celexa for depression, start Risperdal for psychosis.   3. Seizure disorder. We continued Lamictal titration that was just started by Dr. Melrose Nakayama. EEG was completed. We restarted Depakote. Level tomorrow.   4. Hypertension. She is on Norvasc, Coreg,  aspirin and clonidine.  5. Hypokalemia. She is on potassium pils. Potassium level is normal.  6. GERD. She is on Protonix.  7. Anxiety. She was on Xanax. We gradually discontinued it given her confusion.  8. Metabolic screening. Lipid profile, TSH, and hemoglobin A1c are normal. Prolactin 9.4.  9. B12 deficiency. We will continue vitamin B12.  10. UTI. Urine is dirty. There was no significant growth when cultured. We offer fosfomycin.   11. Disposition. She will be discharged with her daughter. She will follow up with [facility].  Orson Slick, MD 08/17/2015, 5:59 AM

## 2015-08-17 NOTE — Progress Notes (Signed)
[facility] MD Progress Note  08/17/2015 7:15 PM TRENITI DEDEAUX  MRN:  [id number]  Subjective:  Ms. Stelzner denies any symptoms of depression, anxiety or psychosis. Halucinations have resoled. Cognitive difficulties have resolved. Aticipated discharge tomorrow. She participated in treatment team meeting with no difficulties. There are no somatic complaints. Tolerates medications well.  Principal Problem: Schizoaffective disorder, bipolar type ([place]) Diagnosis:   Patient Active Problem List   Diagnosis Date Noted  . Seizure disorder ([place]) [id number] 08/10/2015  . Cannabis use disorder, moderate, dependence ([place]) [F12.20] 08/10/2015  . Schizoaffective disorder, bipolar type ([place]) [F25.0] 08/10/2015  . Abnormal finding on MRI of brain [R93.0]   . Hypokalemia [E87.6] 07/15/2015  . Confusion [R41.0]   . GERD (gastroesophageal reflux disease) [K21.9] 11/25/2014  . Essential hypertension [I10] 11/25/2014   Total Time spent with patient: 20 minutes  Past Psychiatric History: schizoaffective disorder.  Past Medical History:  Past Medical History  Diagnosis Date  . Hypertension   . Anxiety   . Depression   . Mini stroke ([place])     "several since 05/2014" (07/15/2015)  . Hyperlipidemia   . GERD (gastroesophageal reflux disease)   . Headache     "weekly" (07/15/2015)  . Seizures ([place]) dx'd 04/2015  . Arthritis     "all over"  . Chronic lower back pain     Past Surgical History  Procedure Laterality Date  . Vaginal hysterectomy    . Tubal ligation    . Dilation and curettage of uterus    . Lumbar puncture  07/14/2015   Family History:  Family History  Problem Relation Age of Onset  . Hyperlipidemia Mother   . Breast cancer Mother 30  . Hypertension Mother   . Heart attack Mother     age 106's  . Breast cancer Sister 60  . Hyperlipidemia Father   . Hypertension Father   . Heart attack Father 50   Family Psychiatric  History: see H&P. Social History:  History  Alcohol Use  . 0.0 oz/week   . 0 Standard drinks or equivalent per week    Comment: 07/15/2015 "nothing in the last few years"     History  Drug Use No    Social History   Social History  . Marital Status: Single    Spouse Name: N/A  . Number of Children: N/A  . Years of Education: N/A   Occupational History  . disabled    Social History Main Topics  . Smoking status: Former Smoker -- 1.00 packs/day for 5 years    Types: Cigarettes  . Smokeless tobacco: Never Used     Comment: "quit smoking cigarettes in the early 2000s"  . Alcohol Use: 0.0 oz/week    0 Standard drinks or equivalent per week     Comment: 07/15/2015 "nothing in the last few years"  . Drug Use: No  . Sexual Activity: Not Asked   Other Topics Concern  . None   Social History Narrative   Additional Social History:                         Sleep: Fair  Appetite:  Fair  Current Medications: Current Facility-Administered Medications  Medication Dose Route Frequency Provider Last Rate Last Dose  . acetaminophen (TYLENOL) tablet 650 mg  650 mg Oral Q6H PRN Gonzella Lex, MD      . alum & mag hydroxide-simeth (MAALOX/MYLANTA) 200-200-20 MG/5ML suspension 30 mL  30 mL Oral Q4H PRN Jenny Reichmann  T Clapacs, MD      . amLODipine (NORVASC) tablet 10 mg  10 mg Oral Daily Gonzella Lex, MD   10 mg at 08/17/15 0915  . aspirin EC tablet 81 mg  81 mg Oral Daily Gonzella Lex, MD   81 mg at 08/17/15 0915  . carvedilol (COREG) tablet 25 mg  25 mg Oral BID WC Gonzella Lex, MD   25 mg at 08/17/15 1850  . citalopram (CELEXA) tablet 40 mg  40 mg Oral Daily Gonzella Lex, MD   40 mg at 08/17/15 0914  . cloNIDine (CATAPRES) tablet 0.1 mg  0.1 mg Oral BID Gonzella Lex, MD   0.1 mg at 08/17/15 0914  . divalproex (DEPAKOTE) DR tablet 500 mg  500 mg Oral Q8H Jolanta B Pucilowska, MD   500 mg at 08/17/15 1456  . lamoTRIgine (LAMICTAL) tablet 25 mg  25 mg Oral Daily Gonzella Lex, MD   25 mg at 08/17/15 0914  . magnesium hydroxide (MILK OF MAGNESIA)  suspension 30 mL  30 mL Oral Daily PRN Gonzella Lex, MD      . pantoprazole (PROTONIX) EC tablet 40 mg  40 mg Oral Daily Gonzella Lex, MD   40 mg at 08/17/15 0914  . potassium chloride (K-DUR,KLOR-CON) CR tablet 40 mEq  40 mEq Oral BID Gonzella Lex, MD   40 mEq at 08/17/15 0914  . risperiDONE (RISPERDAL) tablet 2 mg  2 mg Oral BID Clovis Fredrickson, MD   2 mg at 08/17/15 0914  . vitamin B-12 (CYANOCOBALAMIN) tablet 1,000 mcg  1,000 mcg Oral Daily Clovis Fredrickson, MD   1,000 mcg at 08/17/15 0915  . white petrolatum (VASELINE) gel   Topical PRN Clovis Fredrickson, MD        Lab Results:  Results for orders placed or performed during the hospital encounter of 08/10/15 (from the past 48 hour(s))  Valproic acid level     Status: None   Collection Time: 08/17/15  6:47 AM  Result Value Ref Range   Valproic Acid Lvl 90 50.0 - 100.0 ug/mL    Blood Alcohol level:  Lab Results  Component Value Date   ETH <5 08/10/2015    Physical Findings: AIMS: Facial and Oral Movements Muscles of Facial Expression: None, normal Lips and Perioral Area: None, normal Jaw: None, normal Tongue: None, normal,Extremity Movements Upper (arms, wrists, hands, fingers): None, normal Lower (legs, knees, ankles, toes): None, normal, Trunk Movements Neck, shoulders, hips: None, normal, Overall Severity Severity of abnormal movements (highest score from questions above): None, normal Incapacitation due to abnormal movements: None, normal Patient's awareness of abnormal movements (rate only patient's report): No Awareness, Dental Status Current problems with teeth and/or dentures?: No Does patient usually wear dentures?: No  CIWA:    COWS:     Musculoskeletal: Strength & Muscle Tone: within normal limits Gait & Station: normal Patient leans: N/A  Psychiatric Specialty Exam: Physical Exam  Nursing note and vitals reviewed.   Review of Systems  Psychiatric/Behavioral: Positive for hallucinations  and memory loss. The patient is nervous/anxious.   All other systems reviewed and are negative.   Blood pressure 122/49, pulse 80, temperature 98.5 F (36.9 C), temperature source Oral, resp. rate 16, height 5\' 1"  (1.549 m), weight 64.864 kg (143 lb), SpO2 100 %.Body mass index is 27.03 kg/(m^2).  General Appearance: Casual  Eye Contact:  Good  Speech:  Clear and Coherent  Volume:  Normal  Mood:  Anxious  Affect:  Appropriate  Thought Process:  Goal Directed  Orientation:  Full (Time, Place, and Person)  Thought Content:  WDL  Suicidal Thoughts:  No  Homicidal Thoughts:  No  Memory:  Immediate;   Fair Recent;   Fair Remote;   Fair  Judgement:  Impaired  Insight:  Shallow  Psychomotor Activity:  Normal  Concentration:  Concentration: Fair and Attention Span: Fair  Recall:  [employer] of Knowledge:  Fair  Language:  Fair  Akathisia:  No  Handed:  Right  AIMS (if indicated):     Assets:  Communication Skills Desire for Improvement Financial Resources/Insurance Housing Resilience Social Support  ADL's:  Intact  Cognition:  WNL  Sleep:  Number of Hours: 6.45     Treatment Plan Summary: Daily contact with patient to assess and evaluate symptoms and progress in treatment and Medication management   [name] is a 60 year old female with a history of schizoaffective disorder and new onset neurological problems was admitted to the hospital for auditory hallucinations, paranoia, confusion, and bizarre behaviors in the context of presumed treatment noncompliance but good Depakote level.   1. Suicidal and homicidal ideation: According to the chart the patient voiced suicidal or homicidal ideation and threatened to use the gun.  2. Mood. She reportedly has been treated with Seroquel, Xanax, Celexa and Depakote that is now being switched to Lamictal. Ammonia level is not elevated. Will continue Celexa for depression, start Risperdal for psychosis.   3. Seizure disorder. We  continued Lamictal titration that was just started by Dr. Melrose Nakayama. EEG was completed. We restarted Depakote. VPA 93.    4. Hypertension. She is on Norvasc, Coreg, aspirin and clonidine.  5. Hypokalemia. She is on potassium pils. Potassium level is normal.  6. GERD. She is on Protonix.  7. Anxiety. She was on Xanax. We gradually discontinued it given her confusion.  8. Metabolic screening. Lipid profile, TSH, and hemoglobin A1c are normal. Prolactin 9.4.  9. B12 deficiency. We will continue vitamin B12.  10. UTI. Urine is dirty. There was no significant growth when cultured. We offer fosfomycin.   11. Disposition. She will be discharged with her daughter. She will follow up with [facility].   Orson Slick, MD 08/17/2015, 7:15 PM

## 2015-08-17 NOTE — Progress Notes (Signed)
Recreation Therapy Notes  Date: 06.21.17 Time: 9:30 am Location: Craft Room  Group Topic: Self-esteem  Goal Area(s) Addresses:  Patient will be able to identify at least one positive trait. Patient will verbalize benefit of having healthy self-esteem.  Behavioral Response: Attentive, Interactive  Intervention: I Am  Activity: Patients were given a worksheet with the letter I on it and instructed to write as many positive traits about themselves inside the letter.  Education: LRT educated patients on ways they can increase their self-esteem.  Education Outcome: Acknowledges education/In group clarification offered  Clinical Observations/Feedback: Patient completed activity by writing positive traits. Patient contributed to group discussion by stating why it is difficult to think of positive traits.  Leonette Monarch, LRT/CTRS 08/17/2015 10:12 AM

## 2015-08-17 NOTE — BHH Group Notes (Signed)
[place] Group Notes:  (Nursing/MHT/Case Management/Adjunct)  Date:  08/17/2015  Time:  6:43 PM  Type of Therapy:  Psychoeducational Skills  Participation Level:  Did Not Attend  Charise Killian 08/17/2015, 6:43 PM

## 2015-08-17 NOTE — BHH Suicide Risk Assessment (Addendum)
[facility] Discharge Suicide Risk Assessment   Principal Problem: Schizoaffective disorder, bipolar type [facility]) Discharge Diagnoses:  Patient Active Problem List   Diagnosis Date Noted  . Seizure disorder ([place]) [id number] 08/10/2015  . Cannabis use disorder, moderate, dependence ([place]) [F12.20] 08/10/2015  . Schizoaffective disorder, bipolar type ([place]) [F25.0] 08/10/2015  . Abnormal finding on MRI of brain [R93.0]   . Hypokalemia [E87.6] 07/15/2015  . Confusion [R41.0]   . GERD (gastroesophageal reflux disease) [K21.9] 11/25/2014  . Essential hypertension [I10] 11/25/2014    Total Time spent with patient: 30 minutes  Musculoskeletal: Strength & Muscle Tone: within normal limits Gait & Station: normal Patient leans: N/A  Psychiatric Specialty Exam: Review of Systems  Psychiatric/Behavioral: Positive for hallucinations, memory loss and substance abuse.  All other systems reviewed and are negative.   Blood pressure 122/49, pulse 80, temperature 98.5 F (36.9 C), temperature source Oral, resp. rate 16, height 5\' 1"  (1.549 m), weight 64.864 kg (143 lb), SpO2 100 %.Body mass index is 27.03 kg/(m^2).  General Appearance: Casual  Eye Contact::  Good  Speech:  Clear and [id number]  Volume:  Normal  Mood:  Euthymic  Affect:  Appropriate  Thought Process:  Goal Directed and Linear  Orientation:  Full (Time, Place, and Person)  Thought Content:  WDL  Suicidal Thoughts:  No  Homicidal Thoughts:  No  Memory:  Immediate;   Fair Recent;   Fair Remote;   Fair  Judgement:  Fair  Insight:  Fair  Psychomotor Activity:  Normal  Concentration:  Fair  Recall:  [employer] of [place]  Language: Fair  Akathisia:  No  Handed:  Right  AIMS (if indicated):     Assets:  Communication Skills Desire for Improvement Financial Resources/Insurance Housing Resilience Social Support  Sleep:  Number of Hours: 6.45  Cognition: WNL  ADL's:  Intact   Mental Status Per Nursing Assessment::   On  Admission:     Demographic Factors:  NA  Loss Factors: Decline in physical health  Historical Factors: Impulsivity  Risk Reduction Factors:   Sense of responsibility to family, Living with another person, especially a relative, Positive social support and Positive therapeutic relationship  Continued Clinical Symptoms:  Bipolar Disorder:   Depressive phase Schizophrenia:   Depressive state Epilepsy  Cognitive Features That Contribute To Risk:  None    Suicide Risk:  Minimal: No identifiable suicidal ideation.  Patients presenting with no risk factors but with morbid ruminations; may be classified as minimal risk based on the severity of the depressive symptoms  Follow-up Information    Follow up with [facility]. Go on 08/22/2015.   Why:  Please arrive to your appointment before scheduled time at 9:00AM. for therapy.  Arrive as early as possible for prompt service.   Contact information:   [facility] [address] [place], [place] [address] [phone]       Plan Of Care/Follow-up recommendations:  Activity:  as tolerated. Diet:  low sodium heart healthy. Other:  keep follow up appointments.  Orson Slick, MD 08/17/2015, 7:54 PM

## 2015-08-17 NOTE — Progress Notes (Signed)
D: Patient remains  Confused , unable to fine directions to dayroom for meals . Voice of wanting to shower , but didn't do so . Continue to wear scrubs.  Appetite good and voice no concerns around sleep. Thought process remains altered  No auditory hallucinations  No pain concerns . Limited Interacting with peers and staff.  A: Encourage patient participation with unit programming . Instruction  Given on  Medication , verbalize understanding. Information given in concrete formate R: Voice no other concerns. Staff continue to monitor

## 2015-08-18 LAB — URINALYSIS COMPLETE WITH MICROSCOPIC (ARMC ONLY)
BILIRUBIN URINE: NEGATIVE
GLUCOSE, UA: NEGATIVE mg/dL
HGB URINE DIPSTICK: NEGATIVE
Ketones, ur: NEGATIVE mg/dL
NITRITE: NEGATIVE
Protein, ur: NEGATIVE mg/dL
SPECIFIC GRAVITY, URINE: 1.008 (ref 1.005–1.030)
pH: 5 (ref 5.0–8.0)

## 2015-08-18 MED ORDER — SULFAMETHOXAZOLE-TRIMETHOPRIM 800-160 MG PO TABS: 1.0000 | ORAL_TABLET | Freq: Two times a day (BID) | ORAL | Status: DC

## 2015-08-18 MED ORDER — FOSFOMYCIN TROMETHAMINE 3 G PO PACK
3.0000 g | PACK | Freq: Once | ORAL | Status: AC
  Administered 2015-08-18: 3 g via ORAL
  Filled 2015-08-18: qty 3

## 2015-08-18 MED ORDER — FLUCONAZOLE 50 MG PO TABS
150.0000 mg | ORAL_TABLET | Freq: Once | ORAL | Status: AC
  Administered 2015-08-18: 150 mg via ORAL
  Filled 2015-08-18: qty 3

## 2015-08-18 NOTE — Progress Notes (Signed)
  [facility] Adult Case Management Discharge Plan :  Will you be returning to the same living situation after discharge:  Yes,  home  At discharge, do you have transportation home?: Yes,  family  Do you have the ability to pay for your medications: Yes,  insurance, income.   Release of information consent forms completed and in the chart;  Patient's signature needed at discharge.  Patient to Follow up at: Follow-up Information    Follow up with [facility]. Go on 08/22/2015.   Why:  Please arrive to your appointment before scheduled time at 9:00AM. for therapy.  Arrive as early as possible for prompt service.   Contact information:   [facility] [address] [place], [place] [address] [phone]       Next level of care provider has access to [facility] and Suicide Prevention discussed: Yes,  With patient and son   Have you used any form of tobacco in the last 30 days? (Cigarettes, Smokeless Tobacco, Cigars, and/or Pipes): No  Has patient been referred to the Quitline?: N/A patient is not a smoker  Patient has been referred for addiction treatment: [facility], [place]  08/18/2015, 9:37 AM

## 2015-08-18 NOTE — Progress Notes (Signed)
D:Patient aware of discharge this shift . Patient returning home . Patient received all belonging locked up . Patient denies  Suicidal  And homicidal ideations  .  A: Writer instructed on discharge criteria  AVS Form , Suicide Risk Assesment Discarge Summary and prescriptions  given to patient. . Aware  Of follow up appointment . R: Patient left unit with no questions  Or concerns with family

## 2015-08-18 NOTE — Plan of Care (Signed)
Problem: Coping: Goal: Ability to identify and develop effective coping behavior will improve Outcome: Progressing Able to let others know of her concerns

## 2015-08-18 NOTE — Progress Notes (Signed)
Recreation Therapy Notes  Date: 06.22.17 Time: 9:30 am Location: Craft Room  Group Topic: Leisure Education  Goal Area(s) Addresses:  Patient will identify things they are grateful for. Patient will be educated on why it is important to be grateful.  Behavioral Response: Did not attend  Intervention: Grateful Wheel  Activity: Patients were given an I Am Grateful For worksheet and instructed to write things they were grateful for under each category.  Education: LRT educated patients on why it is important to be grateful.  Education Outcome: Patient did not attend group.  Clinical Observations/Feedback: Patient did not attend group.  Leonette Monarch, LRT/CTRS 08/18/2015 10:18 AM

## 2015-08-20 DIAGNOSIS — R569 Unspecified convulsions: Secondary | ICD-10-CM | POA: Insufficient documentation

## 2015-09-29 ENCOUNTER — Other Ambulatory Visit: Payer: Self-pay | Admitting: Neurology

## 2015-09-29 DIAGNOSIS — R4182 Altered mental status, unspecified: Secondary | ICD-10-CM

## 2015-09-29 DIAGNOSIS — R569 Unspecified convulsions: Secondary | ICD-10-CM

## 2015-10-07 ENCOUNTER — Ambulatory Visit
Admission: RE | Admit: 2015-10-07 | Discharge: 2015-10-07 | Disposition: A | Discharge status: Home / Self Care | Payer: Medicaid Other | Source: Ambulatory Visit | Attending: Neurology | Admitting: Neurology

## 2015-10-07 DIAGNOSIS — Z8673 Personal history of transient ischemic attack (TIA), and cerebral infarction without residual deficits: Secondary | ICD-10-CM | POA: Insufficient documentation

## 2015-10-07 DIAGNOSIS — R4182 Altered mental status, unspecified: Secondary | ICD-10-CM

## 2015-10-07 DIAGNOSIS — R569 Unspecified convulsions: Secondary | ICD-10-CM

## 2015-10-07 MED ORDER — GADOBENATE DIMEGLUMINE 529 MG/ML IV SOLN
13.0000 mL | Freq: Once | INTRAVENOUS | Status: AC | PRN
  Administered 2015-10-07: 13 mL via INTRAVENOUS

## 2015-10-08 ENCOUNTER — Emergency Department: Payer: Medicaid Other

## 2015-10-08 ENCOUNTER — Emergency Department
Admission: EM | Admit: 2015-10-08 | Discharge: 2015-10-08 | Disposition: A | Discharge status: Home / Self Care | Payer: Medicaid Other | Attending: Student in an Organized Health Care Education/Training Program | Admitting: Student in an Organized Health Care Education/Training Program

## 2015-10-08 DIAGNOSIS — I10 Essential (primary) hypertension: Secondary | ICD-10-CM | POA: Diagnosis not present

## 2015-10-08 DIAGNOSIS — Z7982 Long term (current) use of aspirin: Secondary | ICD-10-CM | POA: Insufficient documentation

## 2015-10-08 DIAGNOSIS — R079 Chest pain, unspecified: Secondary | ICD-10-CM | POA: Diagnosis not present

## 2015-10-08 DIAGNOSIS — F250 Schizoaffective disorder, bipolar type: Secondary | ICD-10-CM | POA: Diagnosis present

## 2015-10-08 DIAGNOSIS — R2243 Localized swelling, mass and lump, lower limb, bilateral: Secondary | ICD-10-CM

## 2015-10-08 DIAGNOSIS — Z87891 Personal history of nicotine dependence: Secondary | ICD-10-CM | POA: Insufficient documentation

## 2015-10-08 DIAGNOSIS — F259 Schizoaffective disorder, unspecified: Secondary | ICD-10-CM | POA: Diagnosis present

## 2015-10-08 DIAGNOSIS — R2241 Localized swelling, mass and lump, right lower limb: Secondary | ICD-10-CM | POA: Diagnosis not present

## 2015-10-08 DIAGNOSIS — Z79899 Other long term (current) drug therapy: Secondary | ICD-10-CM | POA: Insufficient documentation

## 2015-10-08 DIAGNOSIS — M7989 Other specified soft tissue disorders: Secondary | ICD-10-CM

## 2015-10-08 DIAGNOSIS — R2242 Localized swelling, mass and lump, left lower limb: Secondary | ICD-10-CM | POA: Diagnosis not present

## 2015-10-08 DIAGNOSIS — F1220 Cannabis dependence, uncomplicated: Secondary | ICD-10-CM | POA: Diagnosis present

## 2015-10-08 DIAGNOSIS — G40909 Epilepsy, unspecified, not intractable, without status epilepticus: Secondary | ICD-10-CM

## 2015-10-08 LAB — URINE DRUG SCREEN, QUALITATIVE (ARMC ONLY)
Amphetamines, Ur Screen: NOT DETECTED
BARBITURATES, UR SCREEN: NOT DETECTED
Benzodiazepine, Ur Scrn: POSITIVE — AB
COCAINE METABOLITE, UR ~~LOC~~: NOT DETECTED
Cannabinoid 50 Ng, Ur ~~LOC~~: POSITIVE — AB
MDMA (ECSTASY) UR SCREEN: NOT DETECTED
METHADONE SCREEN, URINE: NOT DETECTED
OPIATE, UR SCREEN: NOT DETECTED
Phencyclidine (PCP) Ur S: NOT DETECTED
Tricyclic, Ur Screen: NOT DETECTED

## 2015-10-08 LAB — CBC
HEMATOCRIT: 33 % — AB (ref 35.0–47.0)
HEMOGLOBIN: 11.5 g/dL — AB (ref 12.0–16.0)
MCH: 30.4 pg (ref 26.0–34.0)
MCHC: 34.9 g/dL (ref 32.0–36.0)
MCV: 87.1 fL (ref 80.0–100.0)
Platelets: 171 10*3/uL (ref 150–440)
RBC: 3.79 MIL/uL — ABNORMAL LOW (ref 3.80–5.20)
RDW: 17 % — ABNORMAL HIGH (ref 11.5–14.5)
WBC: 5.1 10*3/uL (ref 3.6–11.0)

## 2015-10-08 LAB — COMPREHENSIVE METABOLIC PANEL
ALK PHOS: 51 U/L (ref 38–126)
ALT: 16 U/L (ref 14–54)
ANION GAP: 8 (ref 5–15)
AST: 19 U/L (ref 15–41)
Albumin: 3.9 g/dL (ref 3.5–5.0)
BILIRUBIN TOTAL: 0.8 mg/dL (ref 0.3–1.2)
BUN: 15 mg/dL (ref 6–20)
CALCIUM: 9.4 mg/dL (ref 8.9–10.3)
CO2: 28 mmol/L (ref 22–32)
Chloride: 103 mmol/L (ref 101–111)
Creatinine, Ser: 1.02 mg/dL — ABNORMAL HIGH (ref 0.44–1.00)
GFR calc non Af Amer: 60 mL/min — ABNORMAL LOW (ref >60)
GLUCOSE: 89 mg/dL (ref 65–99)
Potassium: 3.8 mmol/L (ref 3.5–5.1)
Sodium: 139 mmol/L (ref 135–145)
TOTAL PROTEIN: 7.6 g/dL (ref 6.5–8.1)

## 2015-10-08 LAB — URINALYSIS COMPLETE WITH MICROSCOPIC (ARMC ONLY)
BILIRUBIN URINE: NEGATIVE
Bacteria, UA: NONE SEEN
Glucose, UA: NEGATIVE mg/dL
Hgb urine dipstick: NEGATIVE
KETONES UR: NEGATIVE mg/dL
NITRITE: NEGATIVE
PH: 6 (ref 5.0–8.0)
Protein, ur: NEGATIVE mg/dL
Specific Gravity, Urine: 1.014 (ref 1.005–1.030)

## 2015-10-08 LAB — TROPONIN I: Troponin I: <0.03 ng/mL (ref <0.03)

## 2015-10-08 MED ORDER — ARIPIPRAZOLE 5 MG PO TABS: 5.0000 mg | ORAL_TABLET | Freq: Every day | ORAL | 0 refills | Status: DC

## 2015-10-08 MED ORDER — T.E.D. BELOW KNEE/M-REGULAR MISC: 2.0000 "application " | Freq: Every day | 0 refills | Status: DC

## 2015-10-08 NOTE — Care Management Note (Signed)
Case Management Note  Patient Details  Name: Sandra Charles MRN: [id number] Date of Birth: 06-22-1955  Subjective/Objective:    Spoke to patient about MD suggestion for [facility] RN to help with medications. The daughter states they decline the service. They help the patient with all meds. They want the walker and I have told them the MD is more than happy to help with that.                Action/Plan:   Expected Discharge Date:                  Expected Discharge Plan:     In-House Referral:     Discharge planning Services     Post Acute Care Choice:    Choice offered to:     DME Arranged:    DME Agency:     HH Arranged:    HH Agency:     Status of Service:     If discussed at [employer] of Stay Meetings, dates discussed:    Additional Comments:  Beau Fanny, RN 10/08/2015, 4:21 PM

## 2015-10-08 NOTE — Consult Note (Signed)
[facility] Face-to-Face Psychiatry Consult   Reason for Consult:  Consult for this 60 year old woman with a complex set of medical and psychiatric concerns in the emergency room. Specific concern about some worsening hallucinations. Referring Physician:  Quentin Cornwall Patient Identification: Sandra Charles MRN:  [id number] Principal Diagnosis: Schizoaffective disorder, bipolar type [facility]) Diagnosis:   Patient Active Problem List   Diagnosis Date Noted  . Seizure ([place]) [R56.9]   . UTI (urinary tract infection) [N39.0] 08/17/2015  . Seizure disorder ([place]) [G99.242] 08/10/2015  . Cannabis use disorder, moderate, dependence ([place]) [F12.20] 08/10/2015  . Schizoaffective disorder, bipolar type ([place]) [F25.0] 08/10/2015  . Abnormal finding on MRI of brain [R93.0]   . Hypokalemia [E87.6] 07/15/2015  . Confusion [R41.0]   . GERD (gastroesophageal reflux disease) [K21.9] 11/25/2014  . Essential hypertension [I10] 11/25/2014    Total Time spent with patient: 45 minutes  Subjective:   Sandra Charles is a 60 y.o. female patient admitted with "my legs or swelling".  HPI:  60 year old woman who presented to the emergency room in the company of her family with multiple problems. The initial chief complaint was bilateral edema in her legs but with family and the patient are also concerned about several other possibly related symptoms. The patient had been in the psychiatric unit earlier in the summer for possible psychotic symptoms. She had been prescribed risperidone at that time. Since getting out of the hospital the family had felt like she was having a much more visible tremor and 1 which was impairing her ability to do her activities of daily living. They called the neurologist who has been seeing her and confirmed that risperidone could potentially be causing a tremor. At that they discontinued risperidone couple days ago and started treating her with a low dose of benztropine that was prescribed for her. Despite this  the tremor seems to have not changed at all. Family states however that since stopping the Risperdal they think that her psychotic symptoms are worse. They have noticed her talking to people who aren't there more frequently, saying things that are confusing and don't make sense, appearing to have auditory hallucinations. Additionally they are concerned about what seems to be gradually progressive memory loss. This is all in the context of some recent changes in her medicine. Prior to her admission to the psychiatric ward it's not clear that she was taking any antipsychotic. She had recently been started on Depakote for what was thought to possibly be a seizure disorder although I'm not sure still how definite that diagnosis is. During the time she was in the psychiatric ward she was started on Risperdal. She is also now in the process of being switched over to lamotrigine from Depakote but is still taking some of both of them. Also probably important is that in the psychiatric ward she was taken off of all benzodiazepines and instructed to discontinue them. Her drug screen today is still positive for benzodiazepines and for marijuana. Family reports that she only uses Xanax when she really needs it which is not very frequently.  Social history: Lives with family. I believe it's her daughter who is most involved in trying to get assistance. Patient is not able to work. Has become pretty dependent on her family.  Medical history: Multiple medical problems not all of which seem to be very clear are under control. Has high blood pressure history of urinary tract infections and a possible seizure disorder. She is currently complaining of bilateral leg edema the etiology of  which is still unclear to me. Her pulse is slow today blood pressure is normal.  Substance abuse history: It was identified previously that the patient uses marijuana regularly. This appears to probably still be the case.  Past Psychiatric  History: Past psychiatric history slightly unclear. Doesn't seem to have any past history of suicide attempts or violence. The diagnosis of schizophrenia seems to be a late life issue although all notes are still a little unclear. She has been followed by Dr. Rosine Door as an outpatient. Apparently she used to take Seroquel at some point but the family say that that never did any good at all. I could not identify that there was any other antipsychotic she had been on.  Risk to Self: Is patient at risk for suicide?: No Risk to Others:   Prior Inpatient Therapy:   Prior Outpatient Therapy:    Past Medical History:  Past Medical History:  Diagnosis Date  . Anxiety   . Arthritis    "all over"  . Chronic lower back pain   . Depression   . GERD (gastroesophageal reflux disease)   . Headache    "weekly" (07/15/2015)  . Hyperlipidemia   . Hypertension   . Mini stroke ([place])    "several since 05/2014" (07/15/2015)  . Seizures ([place]) dx'd 04/2015    Past Surgical History:  Procedure Laterality Date  . DILATION AND CURETTAGE OF UTERUS    . LUMBAR PUNCTURE  07/14/2015  . TUBAL LIGATION    . VAGINAL HYSTERECTOMY     Family History:  Family History  Problem Relation Age of Onset  . Hyperlipidemia Mother   . Breast cancer Mother 40  . Hypertension Mother   . Heart attack Mother     age 27's  . Breast cancer Sister 40  . Hyperlipidemia Father   . Hypertension Father   . Heart attack Father 6   Family Psychiatric  History: No significant history this contributory at this point Social History:  History  Alcohol Use  . 0.0 oz/week    Comment: 07/15/2015 "nothing in the last few years"     History  Drug Use No    Social History   Social History  . Marital status: Single    Spouse name: N/A  . Number of children: N/A  . Years of education: N/A   Occupational History  . disabled    Social History Main Topics  . Smoking status: Former Smoker    Packs/day: 1.00    Years: 5.00     Types: Cigarettes  . Smokeless tobacco: Never Used     Comment: "quit smoking cigarettes in the early 2000s"  . Alcohol use 0.0 oz/week     Comment: 07/15/2015 "nothing in the last few years"  . Drug use: No  . Sexual activity: Not on file   Other Topics Concern  . Not on file   Social History Narrative  . No narrative on file   Additional Social History:    Allergies:   Allergies  Allergen Reactions  . Lovastatin Other (See Comments)    Unknown reaction    Labs:  Results for orders placed or performed during the hospital encounter of 10/08/15 (from the past 48 hour(s))  CBC     Status: Abnormal   Collection Time: 10/08/15  1:11 PM  Result Value Ref Range   WBC 5.1 3.6 - 11.0 K/uL   RBC 3.79 (L) 3.80 - 5.20 MIL/uL   Hemoglobin 11.5 (L) 12.0 - 16.0 g/dL  HCT 33.0 (L) 35.0 - 47.0 %   MCV 87.1 80.0 - 100.0 fL   MCH 30.4 26.0 - 34.0 pg   MCHC 34.9 32.0 - 36.0 g/dL   RDW 17.0 (H) 11.5 - 14.5 %   Platelets 171 150 - 440 K/uL  Comprehensive metabolic panel     Status: Abnormal   Collection Time: 10/08/15  1:11 PM  Result Value Ref Range   Sodium 139 135 - 145 mmol/L   Potassium 3.8 3.5 - 5.1 mmol/L   Chloride 103 101 - 111 mmol/L   CO2 28 22 - 32 mmol/L   Glucose, Bld 89 65 - 99 mg/dL   BUN 15 6 - 20 mg/dL   Creatinine, Ser 1.02 (H) 0.44 - 1.00 mg/dL   Calcium 9.4 8.9 - 10.3 mg/dL   Total Protein 7.6 6.5 - 8.1 g/dL   Albumin 3.9 3.5 - 5.0 g/dL   AST 19 15 - 41 U/L   ALT 16 14 - 54 U/L   Alkaline Phosphatase 51 38 - 126 U/L   Total Bilirubin 0.8 0.3 - 1.2 mg/dL   GFR calc non Af Amer 60 (L) >60 mL/min   GFR calc Af Amer >60 >60 mL/min    Comment: (NOTE) The eGFR has been calculated using the CKD EPI equation. This calculation has not been validated in all clinical situations. eGFR's persistently <60 mL/min signify possible Chronic Kidney Disease.    Anion gap 8 5 - 15  Troponin I     Status: None   Collection Time: 10/08/15  1:11 PM  Result Value Ref Range    Troponin I <0.03 <0.03 ng/mL  Urinalysis complete, with microscopic ([facility] only)     Status: Abnormal   Collection Time: 10/08/15  4:04 PM  Result Value Ref Range   Color, Urine YELLOW (A) YELLOW   APPearance CLEAR (A) CLEAR   Glucose, UA NEGATIVE NEGATIVE mg/dL   Bilirubin Urine NEGATIVE NEGATIVE   Ketones, ur NEGATIVE NEGATIVE mg/dL   Specific Gravity, Urine 1.014 1.005 - 1.030   Hgb urine dipstick NEGATIVE NEGATIVE   pH 6.0 5.0 - 8.0   Protein, ur NEGATIVE NEGATIVE mg/dL   Nitrite NEGATIVE NEGATIVE   Leukocytes, UA TRACE (A) NEGATIVE   RBC / HPF 0-5 0 - 5 RBC/hpf   WBC, UA 0-5 0 - 5 WBC/hpf   Bacteria, UA NONE SEEN NONE SEEN   Squamous Epithelial / LPF 0-5 (A) NONE SEEN  Urine Drug Screen, Qualitative ([facility] only)     Status: Abnormal   Collection Time: 10/08/15  4:04 PM  Result Value Ref Range   Tricyclic, Ur Screen NONE DETECTED NONE DETECTED   Amphetamines, Ur Screen NONE DETECTED NONE DETECTED   MDMA (Ecstasy)Ur Screen NONE DETECTED NONE DETECTED   Cocaine Metabolite,Ur Nettie NONE DETECTED NONE DETECTED   Opiate, Ur Screen NONE DETECTED NONE DETECTED   Phencyclidine (PCP) Ur S NONE DETECTED NONE DETECTED   Cannabinoid 50 Ng, Ur [place] POSITIVE (A) NONE DETECTED   Barbiturates, Ur Screen NONE DETECTED NONE DETECTED   Benzodiazepine, Ur Scrn POSITIVE (A) NONE DETECTED   Methadone Scn, Ur NONE DETECTED NONE DETECTED    Comment: (NOTE) 009  Tricyclics, urine               Cutoff 1000 ng/mL 200  Amphetamines, urine             Cutoff 1000 ng/mL 300  MDMA (Ecstasy), urine           Cutoff  500 ng/mL 400  Cocaine Metabolite, urine       Cutoff 300 ng/mL 500  Opiate, urine                   Cutoff 300 ng/mL 600  Phencyclidine (PCP), urine      Cutoff 25 ng/mL 700  Cannabinoid, urine              Cutoff 50 ng/mL 800  Barbiturates, urine             Cutoff 200 ng/mL 900  Benzodiazepine, urine           Cutoff 200 ng/mL 1000 Methadone, urine                Cutoff 300 ng/mL 1100 1200 The  urine drug screen provides only a preliminary, unconfirmed 1300 analytical test result and should not be used for non-medical 1400 purposes. Clinical consideration and professional judgment should 1500 be applied to any positive drug screen result due to possible 1600 interfering substances. A more specific alternate chemical method 1700 must be used in order to obtain a confirmed analytical result.  1800 Gas chromato graphy / mass spectrometry (GC/MS) is the preferred 1900 confirmatory method.     No current facility-administered medications for this encounter.    Current Outpatient Prescriptions  Medication Sig Dispense Refill  . aspirin EC 81 MG tablet Take 81 mg by mouth every morning.     . benztropine (COGENTIN) 1 MG tablet Take 1 mg by mouth 2 (two) times daily.    . carvedilol (COREG) 25 MG tablet Take 25 mg by mouth 2 (two) times daily with a meal.    . Cholecalciferol (VITAMIN D3) 5000 UNITS TABS Take 5,000 Units by mouth every morning.     . citalopram (CELEXA) 40 MG tablet Take 40 mg by mouth every morning.     . cloNIDine (CATAPRES) 0.1 MG tablet Take 1 tablet (0.1 mg total) by mouth 2 (two) times daily. 60 tablet 11  . divalproex (DEPAKOTE) 500 MG DR tablet Take 1 tablet (500 mg total) by mouth every 8 (eight) hours. (Patient taking differently: Take 500 mg by mouth 2 (two) times daily. ) 90 tablet 0  . lamoTRIgine (LAMICTAL) 25 MG tablet Take 1 tablet (25 mg total) by mouth daily. (Patient taking differently: Take 50 mg by mouth 2 (two) times daily. ) 60 tablet 0  . lisinopril (PRINIVIL,ZESTRIL) 10 MG tablet Take 10 mg by mouth every morning.    . pantoprazole (PROTONIX) 40 MG tablet Take 1 tablet (40 mg total) by mouth daily. (Patient taking differently: Take 40 mg by mouth every morning. ) 30 tablet 11  . ARIPiprazole (ABILIFY) 5 MG tablet Take 1 tablet (5 mg total) by mouth daily. 30 tablet 0  . risperiDONE (RISPERDAL) 2 MG tablet Take 1 tablet (2 mg total) by mouth 2  (two) times daily. (Patient not taking: Reported on 10/08/2015) 60 tablet 0  . sulfamethoxazole-trimethoprim (BACTRIM DS,SEPTRA DS) 800-160 MG tablet Take 1 tablet by mouth every 12 (twelve) hours. (Patient not taking: Reported on 10/08/2015) 20 tablet 0    Musculoskeletal: Strength & Muscle Tone: decreased Gait & Station: unsteady Patient leans: N/A  Psychiatric Specialty Exam: Physical Exam  Nursing note and vitals reviewed. Constitutional: She appears well-developed and well-nourished.  HENT:  Head: Normocephalic and atraumatic.  Eyes: Conjunctivae are normal. Pupils are equal, round, and reactive to light.  Neck: Normal range of motion.  Cardiovascular: Normal heart sounds.  Respiratory: Effort normal. No respiratory distress.  GI: Soft.  Musculoskeletal: She exhibits edema.  Neurological: She is alert.  Skin: Skin is warm and dry.  Psychiatric: Her mood appears anxious. Her affect is blunt. Her speech is delayed. She is slowed. Cognition and memory are impaired. She expresses impulsivity. She expresses no suicidal ideation. She exhibits abnormal recent memory.    Review of Systems  HENT: Negative.   Eyes: Negative.   Respiratory: Negative.   Cardiovascular: Positive for leg swelling.  Gastrointestinal: Negative.   Musculoskeletal: Negative.   Skin: Negative.   Neurological: Positive for tremors and weakness.  Psychiatric/Behavioral: Positive for depression, hallucinations and memory loss. Negative for substance abuse and suicidal ideas. The patient is nervous/anxious.     Blood pressure 131/80, pulse (!) 55, temperature 97.9 F (36.6 C), temperature source Oral, resp. rate (!) 22, height '5\' 1"'  (1.549 m), weight 81.6 kg (180 lb), SpO2 97 %.Body mass index is 34.01 kg/m.  General Appearance: Casual  Eye Contact:  Fair  Speech:  Slow and Slurred  Volume:  Decreased  Mood:  Anxious and Dysphoric  Affect:  Blunt  Thought Process:  Irrelevant  Orientation:  Full (Time,  Place, and Person)  Thought Content:  Illogical  Suicidal Thoughts:  No  Homicidal Thoughts:  No  Memory:  Immediate;   Good Recent;   Fair Remote;   Fair  Judgement:  Impaired  Insight:  Shallow  Psychomotor Activity:  Decreased and Tremor  Concentration:  Concentration: Fair  Recall:  [employer] of Knowledge:  Fair  Language:  Fair  Akathisia:  No  Handed:  Right  AIMS (if indicated):     Assets:  Desire for Improvement Resilience Social Support  ADL's:  Impaired  Cognition:  Impaired,  Mild  Sleep:        Treatment Plan Summary: Medication management and Plan 60 year old woman who presents with a complicated and possibly interconnected set of problems. I went through all of the medicines that she is currently taking. The bag the family has had several cardiac medicines, a medicine for acid reflux, the Depakote and Lamictal and citalopram and aspirin. Evidently they have completely stop the risperidone at this point. I advised the patient's family that I could not be certain that we could fix all of their problems here in the emergency room. In particular I could not explain to them why she still has a tremor after stopping the Risperdal. I also could not give them much assurance that her short-term memory would improve although I think that if she were to stop taking benzodiazepines and marijuana that would probably help. They did seem most concerned however about trying to get some medicine to help with the confusion and hallucinations and psychotic symptoms. Taking into account the medicine she has had in the past and the fact that she currently has no insurance coverage I propose a low dose of aripiprazole. Abilify should be a little less likely to cause tremor than the risperidone. Also may help a little more directly with any mood symptoms. Case reviewed with emergency room physician. Prescription written for 5 mg Abilify once a day. Side effects reviewed with family. They are very  strongly advised to follow-up with Dr. Rosine Door for outpatient psychiatric treatment at the earliest opportunity.  Disposition: Patient does not meet criteria for psychiatric inpatient admission.  Alethia Berthold, MD 10/08/2015 5:32 PM

## 2015-10-08 NOTE — ED Triage Notes (Addendum)
Pt reports lightheadedness chest pain, SOB that started today and swelling in feet for past 3 weeks. Pt reports has fallen twice this morning and the first time about 0930 pt was standing in kitchen and fell. Family states she has also been having problems with her memory since may but has gotten worse over the past couple of days. Pt alert to self which is baseline per family.No other deficits noted on neuro exam at this time.

## 2015-10-08 NOTE — ED Provider Notes (Signed)
[facility] Emergency Department Provider Note    First MD Initiated Contact with Patient 10/08/15 1249     (approximate)  I have reviewed the triage vital signs and the nursing notes.   HISTORY  Chief Complaint Chest Pain    HPI Sandra Charles is a 60 y.o. female who presents with multiple complaints including bilateral lower extremity swelling, intermittent chest pains for the past 2 weeks, difficulty ambulating and frequent falls. Patient was recently discharged from inpatient psych with a history of schizophrenia and recent medication changes. Her PCP and psychiatrist recently added on benztropine yesterday for increased tremors. She currently denies any suicidal ideation or homicidal ideation. Her niece and fianc stated that she is normally able to ambulate with a steady gait but for the past 24 hours she has been unable to ambulate and has had multiple falls.  She does have a history of frequent seizures on multiple medications. Also a history of multiple TIAs reportedly.   Past Medical History:  Diagnosis Date  . Anxiety   . Arthritis    "all over"  . Chronic lower back pain   . Depression   . GERD (gastroesophageal reflux disease)   . Headache    "weekly" (07/15/2015)  . Hyperlipidemia   . Hypertension   . Mini stroke ([place])    "several since 05/2014" (07/15/2015)  . Seizures ([place]) dx'd 04/2015    Patient Active Problem List   Diagnosis Date Noted  . Seizure ([place])   . UTI (urinary tract infection) 08/17/2015  . Seizure disorder ([place]) 08/10/2015  . Cannabis use disorder, moderate, dependence ([place]) 08/10/2015  . Schizoaffective disorder, bipolar type ([place]) 08/10/2015  . Abnormal finding on MRI of brain   . Hypokalemia 07/15/2015  . Confusion   . GERD (gastroesophageal reflux disease) 11/25/2014  . Essential hypertension 11/25/2014    Past Surgical History:  Procedure Laterality Date  . DILATION AND CURETTAGE OF UTERUS    . LUMBAR  PUNCTURE  07/14/2015  . TUBAL LIGATION    . VAGINAL HYSTERECTOMY      Prior to Admission medications   Medication Sig Start Date End Date Taking? Authorizing Provider  amLODipine (NORVASC) 10 MG tablet Take 10 mg by mouth daily.    Historical Provider, MD  aspirin EC 81 MG tablet Take 81 mg by mouth daily.     Historical Provider, MD  carvedilol (COREG) 25 MG tablet Take 25 mg by mouth 2 (two) times daily with a meal.    Historical Provider, MD  Cholecalciferol (VITAMIN D3) 5000 UNITS TABS Take 5,000 Units by mouth daily.    Historical Provider, MD  citalopram (CELEXA) 40 MG tablet Take 40 mg by mouth daily.    Historical Provider, MD  cloNIDine (CATAPRES) 0.1 MG tablet Take 1 tablet (0.1 mg total) by mouth 2 (two) times daily. 07/15/15   Loletha Grayer, MD  cyanocobalamin (,VITAMIN B-12,) 1000 MCG/ML injection Inject 1 mL (1,000 mcg total) into the muscle daily. 07/15/15   Loletha Grayer, MD  divalproex (DEPAKOTE) 500 MG DR tablet Take 1 tablet (500 mg total) by mouth every 8 (eight) hours. 07/19/15   Maryann Mikhail, DO  lamoTRIgine (LAMICTAL) 25 MG tablet Take 1 tablet (25 mg total) by mouth daily. 08/17/15   Clovis Fredrickson, MD  pantoprazole (PROTONIX) 40 MG tablet Take 1 tablet (40 mg total) by mouth daily. 11/25/14   Wellington Hampshire, MD  risperiDONE (RISPERDAL) 2 MG tablet Take 1 tablet (2 mg total) by  mouth 2 (two) times daily. 08/17/15   Clovis Fredrickson, MD  sulfamethoxazole-trimethoprim (BACTRIM DS,SEPTRA DS) 800-160 MG tablet Take 1 tablet by mouth every 12 (twelve) hours. 08/19/15   Clovis Fredrickson, MD    Allergies Lovastatin and Tramadol  Family History  Problem Relation Age of Onset  . Hyperlipidemia Mother   . Breast cancer Mother 9  . Hypertension Mother   . Heart attack Mother     age 73's  . Breast cancer Sister 79  . Hyperlipidemia Father   . Hypertension Father   . Heart attack Father 14    Social History Social History  Substance Use Topics  .  Smoking status: Former Smoker    Packs/day: 1.00    Years: 5.00    Types: Cigarettes  . Smokeless tobacco: Never Used     Comment: "quit smoking cigarettes in the early 2000s"  . Alcohol use 0.0 oz/week     Comment: 07/15/2015 "nothing in the last few years"    Review of Systems Patient denies headaches, rhinorrhea, blurry vision, numbness, shortness of breath, chest pain, edema, cough, abdominal pain, nausea, vomiting, diarrhea, dysuria, fevers, rashes or hallucinations unless otherwise stated above in HPI. ____________________________________________   PHYSICAL EXAM:  VITAL SIGNS: Vitals:   10/08/15 1239  BP: 120/77  Pulse: (!) 56  Resp: 20  Temp: 97.9 F (36.6 C)    Constitutional: Alert and oriented.No acute distress Eyes: Conjunctivae are normal. PERRL. EOMI. Head: Atraumatic. Nose: No congestion/rhinnorhea. Mouth/Throat: Mucous membranes are moist.  Oropharynx non-erythematous. Neck: No stridor. Painless ROM. No cervical spine tenderness to palpation Hematological/Lymphatic/Immunilogical: No cervical lymphadenopathy. Cardiovascular: Normal rate, regular rhythm. Grossly normal heart sounds.  Good peripheral circulation. Respiratory: Normal respiratory effort.  No retractions. Lungs CTAB. Gastrointestinal: Soft and nontender. No distention. No abdominal bruits. No CVA tenderness. Genitourinary:  Musculoskeletal: No lower extremity tenderness nor edema.  No joint effusions. Neurologic:  Normal speech and language. No gross focal neurologic deficits are appreciated. No facial droop.grossly unsteady gait with small narrow-based steps and positive Romberg. Skin:  Skin is warm, dry and intact. No rash noted.  ____________________________________________   LABS (all labs ordered are listed, but only abnormal results are displayed)  Results for orders placed or performed during the hospital encounter of 10/08/15 (from the past 24 hour(s))  CBC     Status: Abnormal    Collection Time: 10/08/15  1:11 PM  Result Value Ref Range   WBC 5.1 3.6 - 11.0 K/uL   RBC 3.79 (L) 3.80 - 5.20 MIL/uL   Hemoglobin 11.5 (L) 12.0 - 16.0 g/dL   HCT 33.0 (L) 35.0 - 47.0 %   MCV 87.1 80.0 - 100.0 fL   MCH 30.4 26.0 - 34.0 pg   MCHC 34.9 32.0 - 36.0 g/dL   RDW 17.0 (H) 11.5 - 14.5 %   Platelets 171 150 - 440 K/uL  Comprehensive metabolic panel     Status: Abnormal   Collection Time: 10/08/15  1:11 PM  Result Value Ref Range   Sodium 139 135 - 145 mmol/L   Potassium 3.8 3.5 - 5.1 mmol/L   Chloride 103 101 - 111 mmol/L   CO2 28 22 - 32 mmol/L   Glucose, Bld 89 65 - 99 mg/dL   BUN 15 6 - 20 mg/dL   Creatinine, Ser 1.02 (H) 0.44 - 1.00 mg/dL   Calcium 9.4 8.9 - 10.3 mg/dL   Total Protein 7.6 6.5 - 8.1 g/dL   Albumin 3.9 3.5 - 5.0 g/dL  AST 19 15 - 41 U/L   ALT 16 14 - 54 U/L   Alkaline Phosphatase 51 38 - 126 U/L   Total Bilirubin 0.8 0.3 - 1.2 mg/dL   GFR calc non Af Amer 60 (L) >60 mL/min   GFR calc Af Amer >60 >60 mL/min   Anion gap 8 5 - 15  Troponin I     Status: None   Collection Time: 10/08/15  1:11 PM  Result Value Ref Range   Troponin I <0.03 <0.03 ng/mL  Urinalysis complete, with microscopic ([facility] only)     Status: Abnormal   Collection Time: 10/08/15  4:04 PM  Result Value Ref Range   Color, Urine YELLOW (A) YELLOW   APPearance CLEAR (A) CLEAR   Glucose, UA NEGATIVE NEGATIVE mg/dL   Bilirubin Urine NEGATIVE NEGATIVE   Ketones, ur NEGATIVE NEGATIVE mg/dL   Specific Gravity, Urine 1.014 1.005 - 1.030   Hgb urine dipstick NEGATIVE NEGATIVE   pH 6.0 5.0 - 8.0   Protein, ur NEGATIVE NEGATIVE mg/dL   Nitrite NEGATIVE NEGATIVE   Leukocytes, UA TRACE (A) NEGATIVE   RBC / HPF 0-5 0 - 5 RBC/hpf   WBC, UA 0-5 0 - 5 WBC/hpf   Bacteria, UA NONE SEEN NONE SEEN   Squamous Epithelial / LPF 0-5 (A) NONE SEEN  Urine Drug Screen, Qualitative ([facility] only)     Status: Abnormal   Collection Time: 10/08/15  4:04 PM  Result Value Ref Range   Tricyclic, Ur Screen  NONE DETECTED NONE DETECTED   Amphetamines, Ur Screen NONE DETECTED NONE DETECTED   MDMA (Ecstasy)Ur Screen NONE DETECTED NONE DETECTED   Cocaine Metabolite,Ur [place] NONE DETECTED NONE DETECTED   Opiate, Ur Screen NONE DETECTED NONE DETECTED   Phencyclidine (PCP) Ur S NONE DETECTED NONE DETECTED   Cannabinoid 50 Ng, Ur [place] POSITIVE (A) NONE DETECTED   Barbiturates, Ur Screen NONE DETECTED NONE DETECTED   Benzodiazepine, Ur Scrn POSITIVE (A) NONE DETECTED   Methadone Scn, Ur NONE DETECTED NONE DETECTED   ____________________________________________  EKG  Time: 12:56  Indication: chest pain  Rate: 55  Rhythm: sinus Axis: normal Other: non specific ST changes, no acute ischemia ____________________________________________  RADIOLOGY  CT head with NAICA LE duplex without evidence of DVT CXR without evidence of chf ____________________________________________   PROCEDURES  Procedure(s) performed: none    Critical Care performed: no ____________________________________________   INITIAL IMPRESSION / ASSESSMENT AND PLAN / ED COURSE  Pertinent labs & imaging results that were available during my care of the patient were reviewed by me and considered in my medical decision making (see chart for details).  DDX: Medication side effect, ACS, pneumonia, UTI, electrolyte abnormality, congestive heart failure, subdural, TIA  PREET HARPHAM is a 60 y.o. who presents to the ED with with multiple complaints which seem focused around recent medication changes to her psychiatric medications as well as lower extremity swelling. She arrives hemodynamically stable. She has no focal neurologic deficits to suggest a CVA. She does have some baseline tremors and a was Parkinsonian  style gait is likely secondary to medications. CT imaging ordered due to concern for acute head injury due to reported falls. We'll also order lower extremity duplex to evaluate for blood clot as well as laboratory evaluation  to assess for any electrolyte abnormalities versus signs of heart failure. She's been having this on-and-off chest pain for several days now do not feel is consistent with ACS is her EKG and troponin are negative for  acute ischemia.  Clinical Course  Comment By Time  CT head and radiographic imaging results shows no acute intracranial abnormality. Ultrasound Doppler of bilateral lower extremity showed no acute DVT. Her electrolyte panel is unremarkable.   Merlyn Lot, MD 08/12 [phone]  Patient seen by psychiatry. UDS with cannabinoid and benzos positive. Dr. Lissa Hoard practice kindly made recommendation to change medications to Abilify to decrease symptoms and side effects. Patient will plan to follow up with primary psychiatrist this week. Still denying any suicidal ideation or homicidal ideation.  As patient remains hemodynamically stable with a nonfocal neuro exam the patient is stable for outpatient management. Her MRI performed yesterday shows no acute changes and repeat CT head today shows no evidence of acute traumatic injury. Patient was able to ambulate appropriately with a walker assistance which will be provided to the patient. I offered additional assistance including home health per the family declines these needs. She's not have any evidence of acute heart failure or acute DVT. Lower stomach swelling will be treated conservatively with lower extremity compression stockings. I discussed signs and symptoms for which the patient should return emergently to the hospital. Have discussed with the patient and available family all diagnostics and treatments performed thus far and all questions were answered to the best of my ability. The patient demonstrates understanding and agreement with plan.  Merlyn Lot, MD 08/12 1729     ____________________________________________   FINAL CLINICAL IMPRESSION(S) / ED DIAGNOSES  Final diagnoses:  Chest pain, unspecified chest pain type  Localized  swelling of lower extremity      NEW MEDICATIONS STARTED DURING THIS VISIT:  New Prescriptions   No medications on file     Note:  This document was prepared using Dragon voice recognition software and may include unintentional dictation errors.    Merlyn Lot, MD 10/08/15 1759

## 2015-10-08 NOTE — ED Notes (Signed)
Pt reports has been more weak than normal, falling more often. [place] today but denies any injury. Reports having some trouble with her memory lately. Had MRI yesterday. Here today c/o chest pain and sob.

## 2015-10-08 NOTE — ED Notes (Signed)
Pt ambulated with walker.  Pt seamed steady with the walker, but stated that she felt dizzy when standing.

## 2015-10-08 NOTE — Clinical Social Work Note (Signed)
Clinical Social Work Assessment  Patient Details  Name: Sandra Charles MRN: [id number] Date of Birth: 07-20-1955  Date of referral:  10/08/15               Reason for consult:  Facility Placement                Permission sought to share information with:  Family Supports Permission granted to share information::  Yes, Verbal Permission Granted  Name::     Oswaldo Milian (fiance), [name] (niece)  Agency::     Relationship::     Contact Information:     Housing/Transportation Living arrangements for the past 2 months:  Single Family Home Source of Information:  Patient, Partner (Niece) Patient Interpreter Needed:  None Criminal Activity/Legal Involvement Pertinent to Current Situation/Hospitalization:  No - Comment as needed Significant Relationships:  Warehouse manager, Soil scientist, Other Family Members Lives with:  Domestic Partner Do you feel safe going back to the place where you live?  Yes Need for family participation in patient care:  Yes (Comment)  Care giving concerns:  Medication and potential placement   Social Worker assessment / plan:  Patient was alert and oriented x4. Patient gave verbal assent to discuss care with her niece, [name] and her fiance, Oswaldo Milian, both of whom were in the room. Patient is connected to an outpatient mental health provider for schizophrenia with hallucinations. Patient in ED due to recent onset of tremors and adverse medication side effects. Patient having some trouble with ambulation, but able to ambulate with assistance of family or walker.  Patient currently uninsured due to recent lapse of Medicaid due to disqualification from income threshhold. Patient does not have placement needs at this time. Patient lives at home with Oswaldo Milian who reported that he is able to care for her needs and ensure her safety. [name] was invested in her aunt's care and understood the disposition thoroughly. Patient verbally reported with calm affect that she feels safe at  home.  Employment status:  Disabled (Comment on whether or not currently receiving Disability) (Currently on SSI) Insurance information:  Self Pay (Medicaid Pending) PT Recommendations:  No Follow Up Information / Referral to community resources:  Outpatient Psychiatric Care (Comment Required) (Patient aware of IPRS options in [place]. Patient established with care, pending Medicaid reconnection)  Patient/Family's Response to care:  Compliant and grateful for care  Patient/Family's Understanding of and Emotional Response to Diagnosis, Current Treatment, and Prognosis:  Patient's family able to verbally indicate diagnosis and treatment plan. Patient and family thanked the CSW for emotional support.  Emotional Assessment Appearance:  Appears older than stated age Attitude/Demeanor/Rapport:  Lethargic (Patient was pleasant but confused due to medication changes) Affect (typically observed):  Appropriate Orientation:  Oriented to Self, Oriented to Place, Oriented to  Time, Oriented to Situation Alcohol / Substance use:  Never Used Psych involvement (Current and /or in the community):  Yes (Comment), Outpatient Provider (Patient established with Outpatient Provider)  Discharge Needs  Concerns to be addressed:  Mental Health Concerns Readmission within the last 30 days:  Yes Current discharge risk:  Inadequate Financial Supports Barriers to Discharge:  No Barriers Identified   Zettie Pho, LCSW 10/08/2015, 5:03 PM

## 2016-06-04 ENCOUNTER — Ambulatory Visit: Payer: Medicaid Other | Admitting: Pharmacy Technician

## 2016-06-04 ENCOUNTER — Encounter (INDEPENDENT_AMBULATORY_CARE_PROVIDER_SITE_OTHER): Payer: Self-pay

## 2016-06-04 DIAGNOSIS — Z79899 Other long term (current) drug therapy: Secondary | ICD-10-CM

## 2016-06-04 NOTE — Progress Notes (Signed)
Completed [facility] application and contract.  Patient agreed to all terms of the [facility] contract.  Provided patient with community resource material based on her particular needs.    [facility] will provide medication assistance until October 27, 2016 at which time patient will have Medicare A, B & D.  [facility]

## 2016-07-30 ENCOUNTER — Ambulatory Visit: Payer: Medicaid Other | Admitting: Pharmacist

## 2016-07-30 ENCOUNTER — Encounter (INDEPENDENT_AMBULATORY_CARE_PROVIDER_SITE_OTHER): Payer: Self-pay

## 2016-07-30 ENCOUNTER — Encounter: Payer: Self-pay | Admitting: Pharmacist

## 2016-07-30 DIAGNOSIS — Z79899 Other long term (current) drug therapy: Secondary | ICD-10-CM

## 2016-07-30 NOTE — Progress Notes (Signed)
[facility] Visit Note  Patient: Sandra Charles MRN: [id number] Date of Birth: [birth year] PCP: Minda Ditto, MD   [name] 61 y.o. female presents for her annual medication therapy management visit today as well as a Medicare Part D review. She will qualify for Medicare starting September 2018. Today she was accompanied by a family member.  There were no vitals taken for this visit.  Patient Information   Past Medical History:  Diagnosis Date  . Anxiety   . Arthritis    "all over"  . Chronic lower back pain   . Depression   . GERD (gastroesophageal reflux disease)   . Headache    "weekly" (07/15/2015)  . Hyperlipidemia   . Hypertension   . Mini stroke ([place])    "several since 05/2014" (07/15/2015)  . Seizures ([place]) dx'd 04/2015      Past Surgical History:  Procedure Laterality Date  . DILATION AND CURETTAGE OF UTERUS    . LUMBAR PUNCTURE  07/14/2015  . TUBAL LIGATION    . VAGINAL HYSTERECTOMY       Family History  Problem Relation Age of Onset  . Hyperlipidemia Mother   . Breast cancer Mother 34  . Hypertension Mother   . Heart attack Mother        age 94's  . Hyperlipidemia Father   . Hypertension Father   . Heart attack Father 64  . Breast cancer Sister 46    New Diagnoses (since last visit): none  Family Support: Good    Current Exercise Habits: The patient does not participate in regular exercise at present       History  Alcohol Use  . 0.0 oz/week    Comment: 07/15/2015 "nothing in the last few years"      History  Smoking Status  . Former Smoker  . Packs/day: 1.00  . Years: 5.00  . Types: Cigarettes  Smokeless Tobacco  . Never Used    Comment: "quit smoking cigarettes in the early 2000s"      Health Maintenance  Topic Date Due  . Hepatitis C Screening  [birth year]  . HIV Screening  11/05/1972  . TETANUS/TDAP  11/05/1976  . PAP SMEAR  11/06/1978  . COLONOSCOPY  11/06/2007  . MAMMOGRAM  09/07/2016  . INFLUENZA  VACCINE  09/26/2016   Health Maintenance/Date Completed  Last Visit to PCP:  Next Visit to PCP: 08-06-16  Specialist Visit: 08-13-16 Dr. Wynetta Emery Dental Exam: none recent Eye Exam: none recent Prostate Exam: n/a Pelvic/PAP Exam: none recent Mammogram: none recent, referred to [facility] DEXA:  Colonoscopy: yes Flu Vaccine: no Pneumonia Vaccine: no  Outpatient Encounter Prescriptions as of 07/30/2016  Medication Sig  . amLODipine (NORVASC) 10 MG tablet Take 10 mg by mouth daily. For high blood pressure  . aspirin EC 81 MG tablet Take 81 mg by mouth every morning.   . benztropine (COGENTIN) 1 MG tablet Take 1 mg by mouth daily. 1/2 tablet (0.5mg ) daily  . carvedilol (COREG) 25 MG tablet Take 25 mg by mouth 2 (two) times daily with a meal.  . Cholecalciferol (VITAMIN D3) 5000 UNITS TABS Take 5,000 Units by mouth every morning.   . cloNIDine (CATAPRES) 0.1 MG tablet Take 1 tablet (0.1 mg total) by mouth 2 (two) times daily.  Marland Kitchen donepezil (ARICEPT) 10 MG tablet Take 10 mg by mouth at bedtime.  . lamoTRIgine (LAMICTAL) 100 MG tablet Take 100 mg by mouth at bedtime.  Marland Kitchen OLANZapine (ZYPREXA) 5 MG  tablet Take 15 mg by mouth at bedtime.  . pantoprazole (PROTONIX) 40 MG tablet Take 1 tablet (40 mg total) by mouth daily. (Patient taking differently: Take 40 mg by mouth every morning. )  . rosuvastatin (CRESTOR) 20 MG tablet Take 20 mg by mouth at bedtime. For high cholesterol  . venlafaxine XR (EFFEXOR-XR) 75 MG 24 hr capsule Take 75 mg by mouth daily.  . [DISCONTINUED] ARIPiprazole (ABILIFY) 5 MG tablet Take 1 tablet (5 mg total) by mouth daily.  . [DISCONTINUED] benztropine (COGENTIN) 1 MG tablet Take 1 mg by mouth 2 (two) times daily.  . [DISCONTINUED] citalopram (CELEXA) 40 MG tablet Take 40 mg by mouth every morning.   . [DISCONTINUED] divalproex (DEPAKOTE) 500 MG DR tablet Take 1 tablet (500 mg total) by mouth every 8 (eight) hours. (Patient taking differently: Take 500 mg by mouth 2  (two) times daily. )  . [DISCONTINUED] Elastic Bandages & Supports (T.E.D. BELOW KNEE/M-REGULAR) MISC 2 application by Does not apply route daily.  . [DISCONTINUED] lamoTRIgine (LAMICTAL) 25 MG tablet Take 1 tablet (25 mg total) by mouth daily. (Patient taking differently: Take 50 mg by mouth 2 (two) times daily. )  . [DISCONTINUED] lisinopril (PRINIVIL,ZESTRIL) 10 MG tablet Take 10 mg by mouth every morning.  . [DISCONTINUED] risperiDONE (RISPERDAL) 2 MG tablet Take 1 tablet (2 mg total) by mouth 2 (two) times daily. (Patient not taking: Reported on 10/08/2015)  . [DISCONTINUED] sulfamethoxazole-trimethoprim (BACTRIM DS,SEPTRA DS) 800-160 MG tablet Take 1 tablet by mouth every 12 (twelve) hours. (Patient not taking: Reported on 10/08/2015)   No facility-administered encounter medications on file as of 07/30/2016.    Assessment and Plan: Reviewed medications today and Medicare Part D plans. Ms. Rotolo family uses a pill box to organize her medications. During the visit, the family member answered most of the questions and provided information about her history and medications.  Hx of altered mental status/anxiety/depression/schizoaffective vs. Schizophrenia/memory loss: seeing Dr. Donnita Falls at [facility]. Taking lamotrigine for seizures, donepezil for memory/thinking. Seeing Dr. Wynetta Emery at [facility], taking venlafaxine ER, olanzapine and benztropine.  Hypertension: currently on amlodipine and carvedilol.   Hyperlipidemia: currently on rosuvastatin. She is on aspirin for stroke prevention.  Medicare Part D review: All of her medications are covered by the Medicare Part plans except the venlafaxine ER. Alternative medications that are covered by Medicare are: duloxetine, citalopram, escitalopram and most SSRI's. Patient will be seeing Dr. Wynetta Emery in two weeks and will discuss this with her at that time.  Patient will qualify for Medicare in September 2018. Her family member has filled out the  application to screen for Extra Help with her Medicare costs. Patient should qualify for the Medicare Savings Program and Full [place].   All of her providers are affiliated with [facility]. If she decides to choose a Medicare Advantage Plan vs. traditional Medicare and a Medicare Part D plan, she will need to make sure those plans are accepted by [facility].  A follow-up appointment was scheduled for August; the subsidy level should be determined through [place] at that time.  Breaker Springer K. Dicky Doe, PharmD [facility] [place] Operations Coordinator [phone]

## 2016-07-30 NOTE — Patient Instructions (Signed)
Ask Dr. Wynetta Emery if the venlafaxine could be changed to an alternative medication. Most of the Medicare Part D plans do not cover this medication. Duloxetine, citalopram and escitalopram, most SSRI's are covered through the Medicare Part D plans.  Once you figure out what level of extra help or subsidy you qualify for, please call [facility] to set up an appointment to review plans again.

## 2016-08-05 IMAGING — DX DG CHEST 1V PORT
1 series · 1 of 1 positions shown · non-contrast
Comparison: October 13, 2014.

CLINICAL DATA: Altered mental status.

EXAM:
PORTABLE CHEST 1 VIEW

[chest ap]
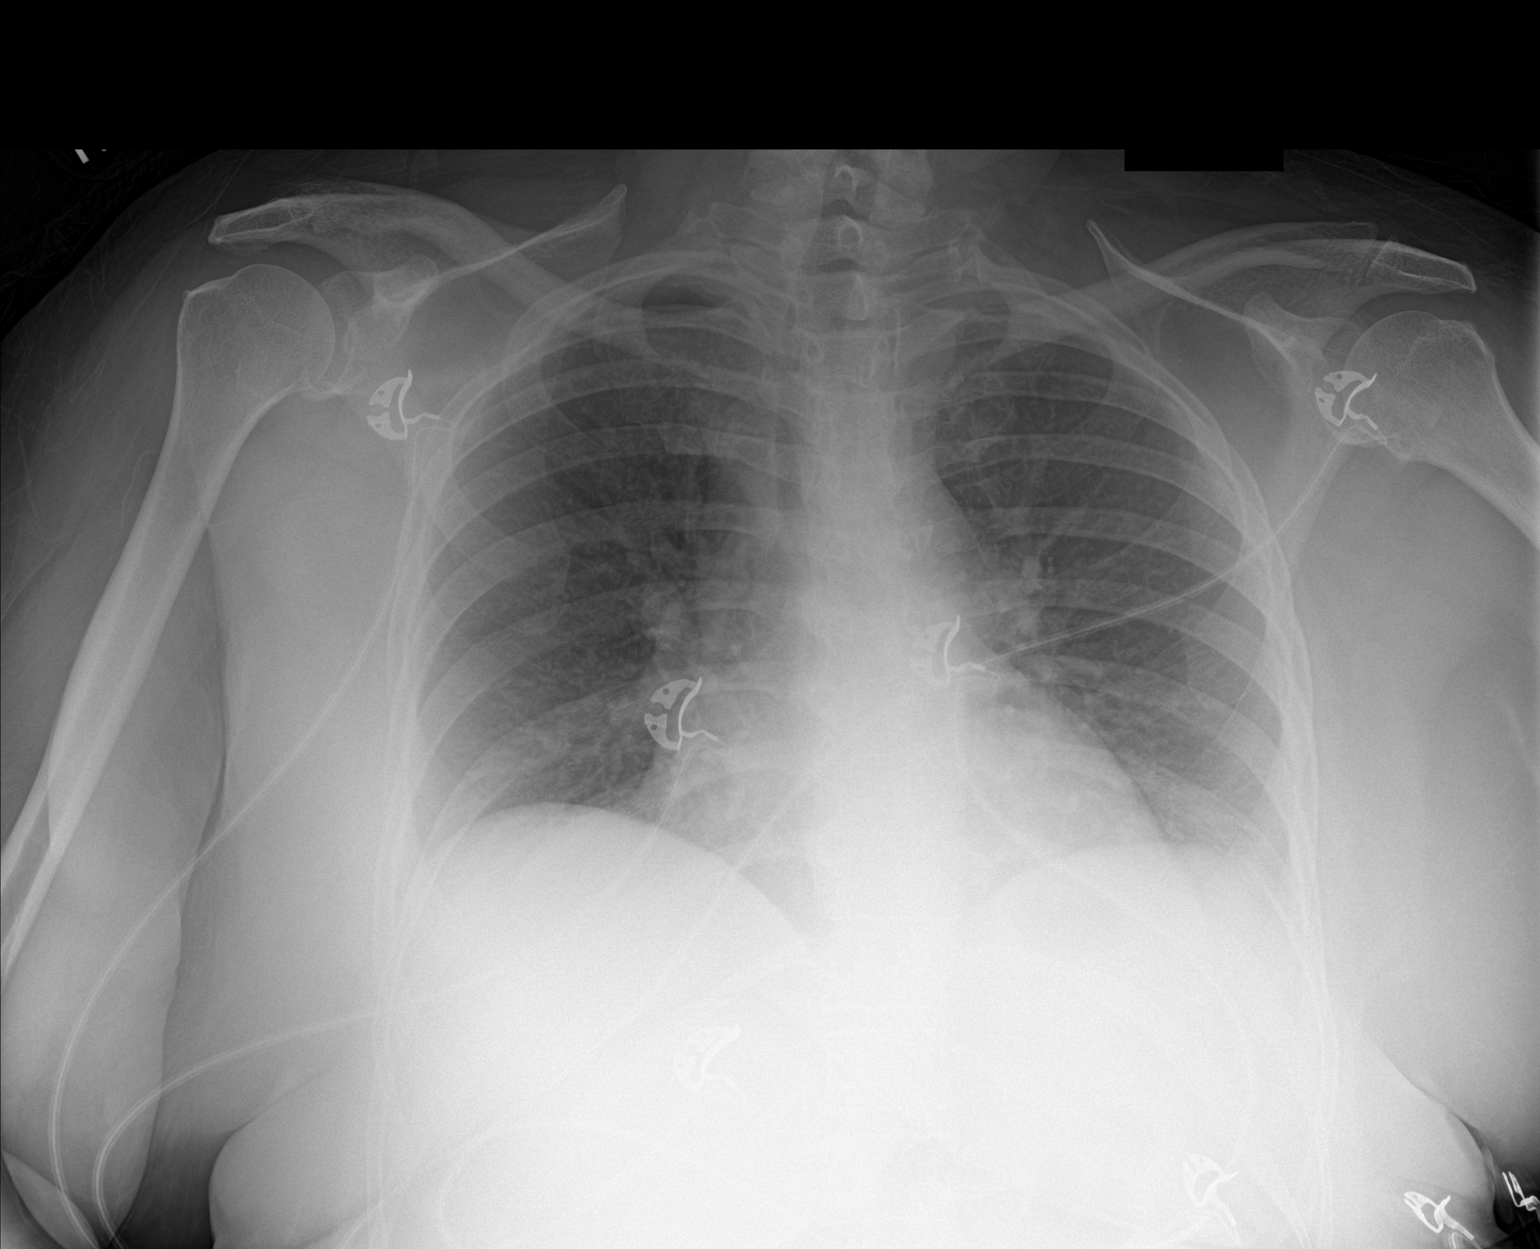

[1 of 1 positions shown; findings below may reference images not displayed]

FINDINGS: The heart size and mediastinal contours are within normal limits.
Both lungs are clear. No pneumothorax or pleural effusion is noted.
The visualized skeletal structures are unremarkable.
IMPRESSION: No acute cardiopulmonary abnormality seen.

## 2016-08-06 IMAGING — MR MR HEAD W/O CM
2 series · 48 of 48 positions shown · non-contrast
Comparison: Head CT without contrast 07/12/2015. Brain MRI
05/14/2015, 05/27/2014.

ADDENDUM:
Study discussed by telephone with Dr. SANTHI MENARD on 07/13/2015
At 8084 hours. She advises me that the patient has been febrile (102
F) and she started the patient on Acyclovir today.
CLINICAL DATA: 59-year-old female with altered mental status.
Confusion. Initial encounter.

EXAM:
MRI HEAD WITHOUT CONTRAST
TECHNIQUE: Multiplanar, multiecho pulse sequences of the brain and surrounding
structures were obtained without intravenous contrast.

[Series 3: DWI · axial · 4.0mm · 0.94mm/px · z∈[-104,+39]mm · 32 of 76 slices shown (1 of 2)]
[im 1/76]
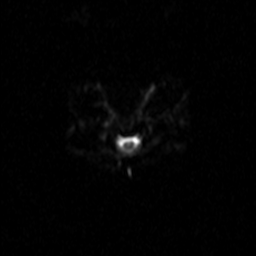
[im 3/76]
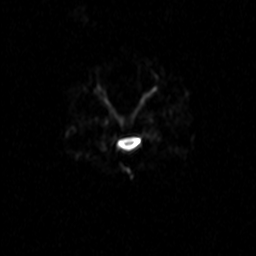
[im 5/76]
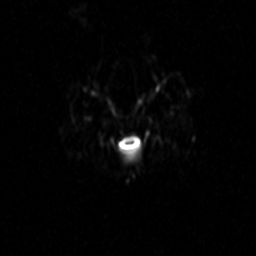
[im 8/76]
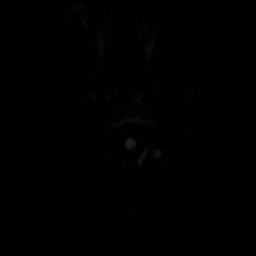
[im 10/76]
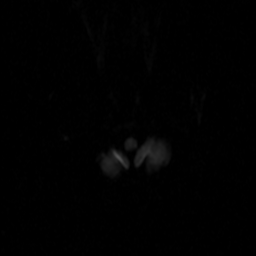
[im 13/76]
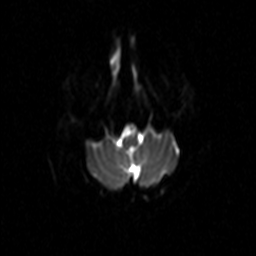
[im 15/76]
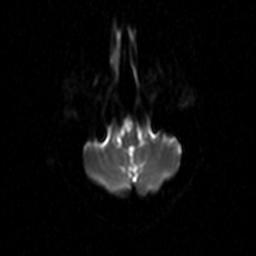
[im 17/76]
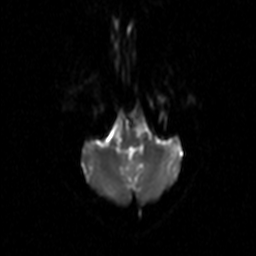
[im 20/76]
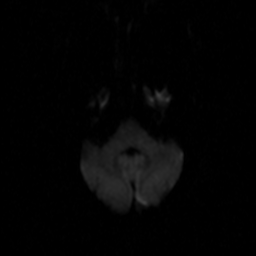
[im 22/76]
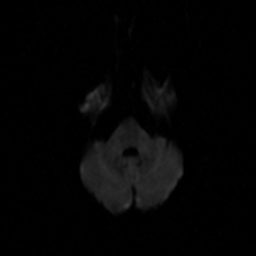
[im 25/76]
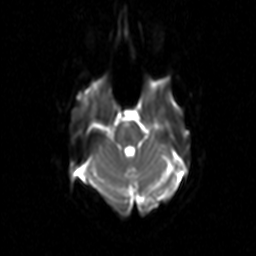
[im 27/76]
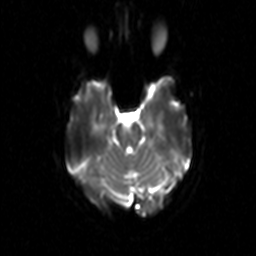
[im 30/76]
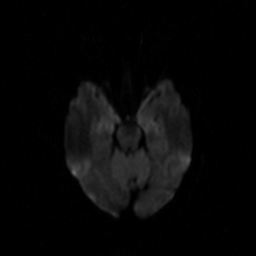
[im 32/76]
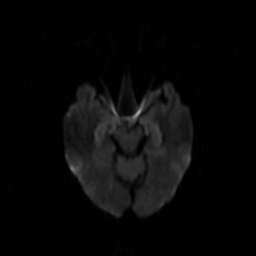
[im 34/76]
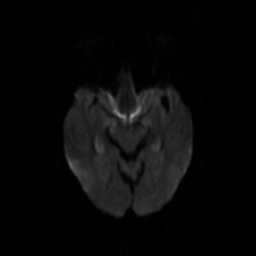
[im 37/76]
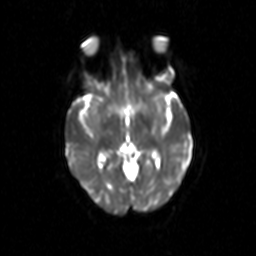
[im 39/76]
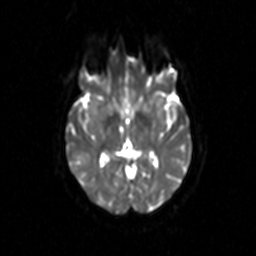
[im 42/76]
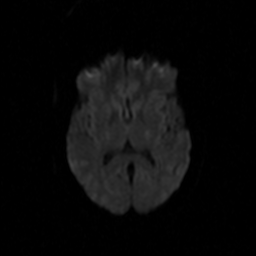
[im 44/76]
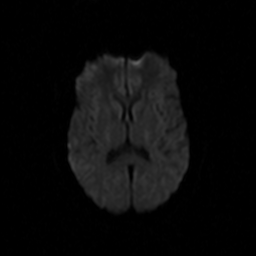
[im 46/76]
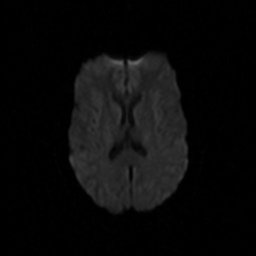
[im 49/76]
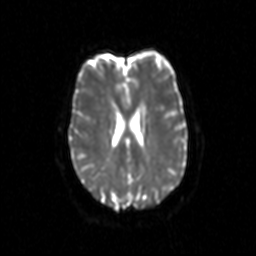
[im 51/76]
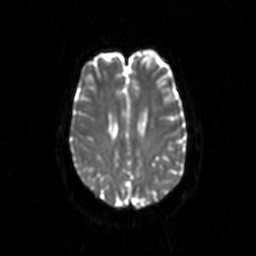
[im 54/76]
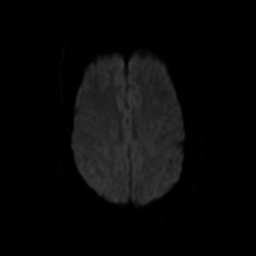
[im 56/76]
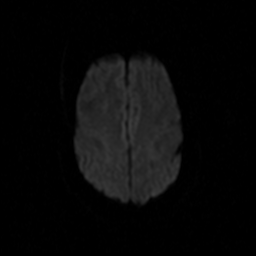
[im 59/76]
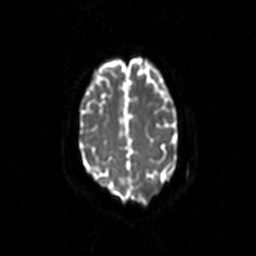
[im 61/76]
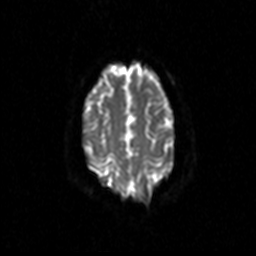
[im 63/76]
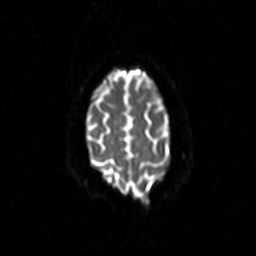
[im 66/76]
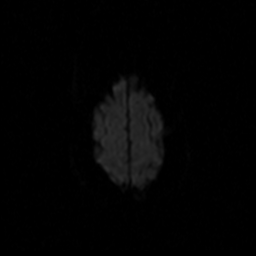
[im 68/76]
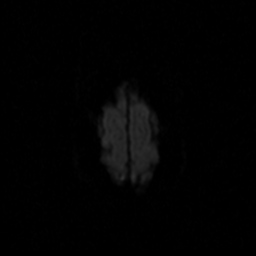
[im 71/76]
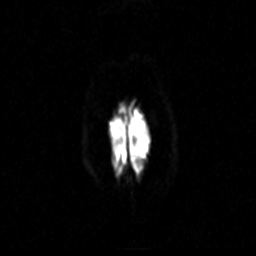
[im 73/76]
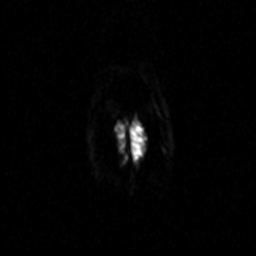
[im 76/76]
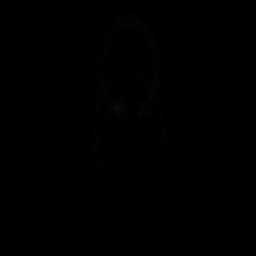

[Series 4: DWI · axial · 4.0mm · 0.94mm/px · z∈[-104,+39]mm · 16 of 38 slices shown (2 of 2)]
[im 1/38]
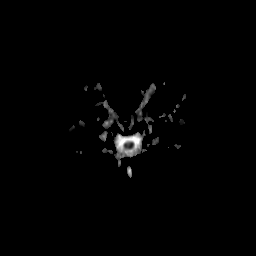
[im 3/38]
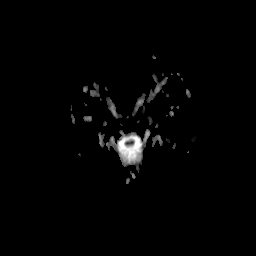
[im 5/38]
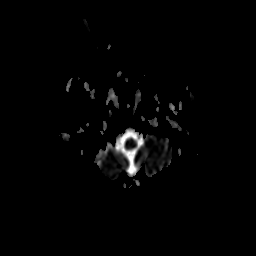
[im 8/38]
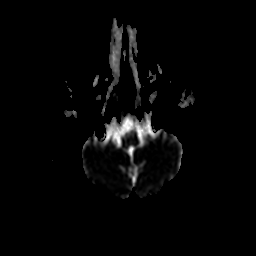
[im 10/38]
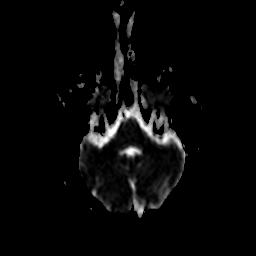
[im 13/38]
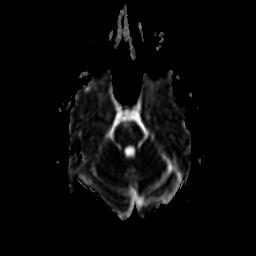
[im 15/38]
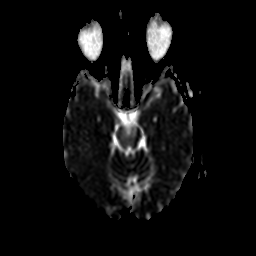
[im 18/38]
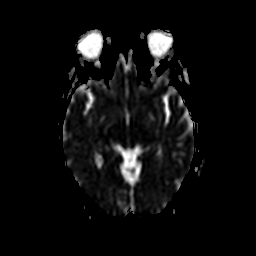
[im 20/38]
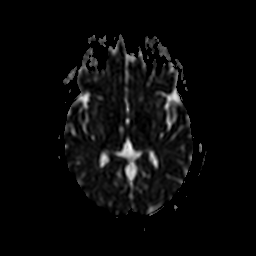
[im 23/38]
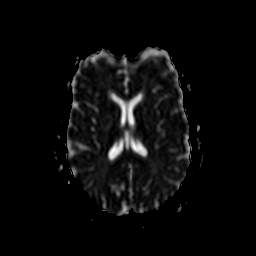
[im 25/38]
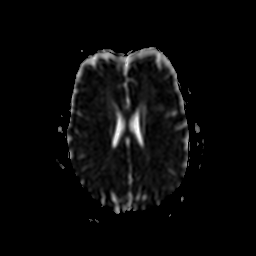
[im 28/38]
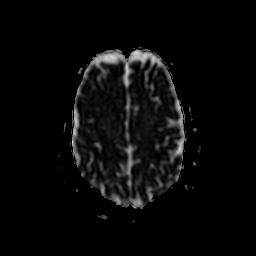
[im 30/38]
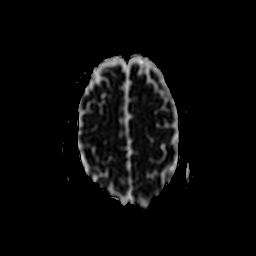
[im 33/38]
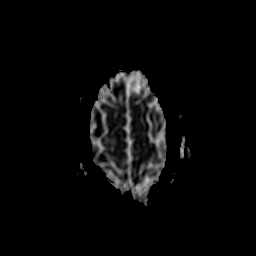
[im 35/38]
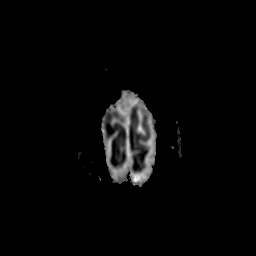
[im 38/38]
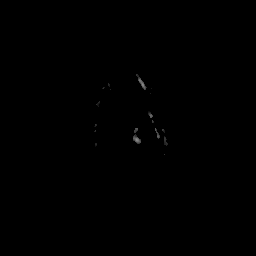

[48 of 48 positions shown; findings below may reference images not displayed]

FINDINGS: The examination had to be discontinued prior to completion due to
patient agitation. Subsequently, only axial diffusion weighted
imaging was obtained.

Cerebral volume appears stable. No ventriculomegaly. No intracranial
mass effect is evident.

Today there is abnormal increased trace diffusion signal in both
hippocampal formations (series 3, image 54). ADC imaging in the same
areas is isointense to mildly facilitated.

No other diffusion abnormality identified.
IMPRESSION: 1. Abnormal diffusion signal in both hippocampal formations. This
pattern argues against vascular or ischemic etiology. Top
differential considerations include status epilepticus and herpes
encephalitis.
2. The examination had to be discontinued prior to completion due to
patient agitation. Only axial diffusion-weighted imaging could be
obtained.

## 2016-10-18 ENCOUNTER — Ambulatory Visit: Payer: Medicaid Other | Admitting: Pharmacist

## 2016-10-18 ENCOUNTER — Encounter (INDEPENDENT_AMBULATORY_CARE_PROVIDER_SITE_OTHER): Payer: Self-pay

## 2016-10-18 DIAGNOSIS — Z79899 Other long term (current) drug therapy: Secondary | ICD-10-CM

## 2016-10-18 NOTE — Progress Notes (Signed)
Ms. Toepfer is here today to review Medicare Part D/Advantage plans with her niece [name].  Patient's current medication list was reviewed and all of their medications can be obtained through a Medicare Part D/Advantage plan. Patient will be eligible to sign up for a Medicare Part D/Advantage plan on 10-27-16. Medicare.gov was used to review the available Part D/Advantage plans. Patient qualifies for the following subsidies: Full LIS. Patient qualified for the Part B premium to be covered as well (MSP). Patient has chosen [employer] PPO.  The patient's niece called [facility] to confirm that all of her specialists and primary care provider would take the [place] plan. The patient was also interested in dental and vision coverage. The patient signed the Medicare [place] to allow the pharmacist to sign her up for the plan which will be effective Sept. 1 st, 2018. The patient and her niece are aware that she can change her plan at any time and that she does not have to wait for the annual enrollment period due to her subsidy.   *MSP = Medicare Savings Plan (Assists with Medicare A and B - premiums, deductibles, coinsurance and copayments) *LIS = Low Income Subsidy (Assists with Medicare Part D - premiums, deductibles and copayments)   Wyllow Seigler K. Dicky Doe, PharmD [facility] Coordinator [phone]

## 2016-12-14 ENCOUNTER — Telehealth: Payer: Self-pay | Admitting: Pharmacy Technician

## 2016-12-14 NOTE — Telephone Encounter (Signed)
Patient has Medicare Part D.  [facility] unable to provide medication assistance.  Patient notified.  [facility]

## 2017-01-11 ENCOUNTER — Encounter: Payer: Self-pay | Admitting: Emergency Medicine

## 2017-01-11 ENCOUNTER — Other Ambulatory Visit: Payer: Self-pay

## 2017-01-11 ENCOUNTER — Emergency Department
Admission: EM | Admit: 2017-01-11 | Discharge: 2017-01-11 | Disposition: A | Discharge status: Home / Self Care | Payer: Medicare HMO | Attending: Emergency Medicine | Admitting: Emergency Medicine

## 2017-01-11 ENCOUNTER — Emergency Department: Payer: Medicare HMO

## 2017-01-11 DIAGNOSIS — Z87891 Personal history of nicotine dependence: Secondary | ICD-10-CM | POA: Insufficient documentation

## 2017-01-11 DIAGNOSIS — I10 Essential (primary) hypertension: Secondary | ICD-10-CM | POA: Insufficient documentation

## 2017-01-11 DIAGNOSIS — Z8673 Personal history of transient ischemic attack (TIA), and cerebral infarction without residual deficits: Secondary | ICD-10-CM | POA: Insufficient documentation

## 2017-01-11 DIAGNOSIS — F419 Anxiety disorder, unspecified: Secondary | ICD-10-CM | POA: Insufficient documentation

## 2017-01-11 DIAGNOSIS — Z7982 Long term (current) use of aspirin: Secondary | ICD-10-CM | POA: Diagnosis not present

## 2017-01-11 DIAGNOSIS — R251 Tremor, unspecified: Secondary | ICD-10-CM | POA: Diagnosis not present

## 2017-01-11 DIAGNOSIS — Z79899 Other long term (current) drug therapy: Secondary | ICD-10-CM | POA: Diagnosis not present

## 2017-01-11 LAB — CBC
HCT: 42.3 % (ref 35.0–47.0)
Hemoglobin: 14.2 g/dL (ref 12.0–16.0)
MCH: 28 pg (ref 26.0–34.0)
MCHC: 33.5 g/dL (ref 32.0–36.0)
MCV: 83.5 fL (ref 80.0–100.0)
PLATELETS: 220 10*3/uL (ref 150–440)
RBC: 5.07 MIL/uL (ref 3.80–5.20)
RDW: 16.1 % — AB (ref 11.5–14.5)
WBC: 10.1 10*3/uL (ref 3.6–11.0)

## 2017-01-11 LAB — BASIC METABOLIC PANEL
Anion gap: 12 (ref 5–15)
BUN: 13 mg/dL (ref 6–20)
CO2: 27 mmol/L (ref 22–32)
CREATININE: 0.9 mg/dL (ref 0.44–1.00)
Calcium: 10 mg/dL (ref 8.9–10.3)
Chloride: 98 mmol/L — ABNORMAL LOW (ref 101–111)
Glucose, Bld: 103 mg/dL — ABNORMAL HIGH (ref 65–99)
Potassium: 3 mmol/L — ABNORMAL LOW (ref 3.5–5.1)
SODIUM: 137 mmol/L (ref 135–145)

## 2017-01-11 LAB — TROPONIN I: Troponin I: <0.03 ng/mL (ref <0.03)

## 2017-01-11 MED ORDER — CLONIDINE HCL 0.1 MG PO TABS
0.1000 mg | ORAL_TABLET | Freq: Once | ORAL | Status: AC
  Administered 2017-01-11: 0.1 mg via ORAL
  Filled 2017-01-11: qty 1

## 2017-01-11 MED ORDER — SODIUM CHLORIDE 0.9 % IV SOLN
1000.0000 mL | Freq: Once | INTRAVENOUS | Status: AC
  Administered 2017-01-11: 1000 mL via INTRAVENOUS

## 2017-01-11 NOTE — ED Notes (Signed)
Patient states she felt dizzy this morning and fell, does not remember if she lost consciousness. Family states patient has "memory" problems.

## 2017-01-11 NOTE — ED Triage Notes (Signed)
Says today started shaking and feeling nervous.  Denies chest pain.  Says she may be sob.  Pt is visibly shaking. Especially hands.

## 2017-01-11 NOTE — ED Provider Notes (Signed)
[facility] Emergency Department Provider Note   ____________________________________________    I have reviewed the triage vital signs and the nursing notes.   HISTORY  Chief Complaint Anxiety; Hypertension; Shaking; and Chest Pain     HPI Sandra Charles is a 61 y.o. female who presents with complaints of high blood pressure and anxiety.  Family reports that the patient has been transitioning from benztropine and venlafaxine to new medications via PCP and that this may be contributing to her anxiety.  Patient does have history of anxiety.  Patient reports she is concerned that her blood pressure is elevated.  She does have a long history of chronic shaking.  No nausea or vomiting.  No headache.  No neuro deficits.  She denies chest pain to me   Past Medical History:  Diagnosis Date  . Anxiety   . Arthritis    "all over"  . Chronic lower back pain   . Depression   . GERD (gastroesophageal reflux disease)   . Headache    "weekly" (07/15/2015)  . Hyperlipidemia   . Hypertension   . Mini stroke ([place])    "several since 05/2014" (07/15/2015)  . Seizures ([place]) dx'd 04/2015    Patient Active Problem List   Diagnosis Date Noted  . Seizure ([place])   . UTI (urinary tract infection) 08/17/2015  . Seizure disorder ([place]) 08/10/2015  . Cannabis use disorder, moderate, dependence ([place]) 08/10/2015  . Schizoaffective disorder, bipolar type ([place]) 08/10/2015  . Abnormal finding on MRI of brain   . Hypokalemia 07/15/2015  . Confusion   . GERD (gastroesophageal reflux disease) 11/25/2014  . Essential hypertension 11/25/2014    Past Surgical History:  Procedure Laterality Date  . DILATION AND CURETTAGE OF UTERUS    . LUMBAR PUNCTURE  07/14/2015  . TUBAL LIGATION    . VAGINAL HYSTERECTOMY      Prior to Admission medications   Medication Sig Start Date End Date Taking? Authorizing Provider  QUEtiapine (SEROQUEL) 50 MG tablet Take 50 mg at bedtime by mouth.    Yes [provider]  amLODipine (NORVASC) 10 MG tablet Take 10 mg by mouth daily. For high blood pressure    [provider]  aspirin EC 81 MG tablet Take 81 mg by mouth every morning.     [provider]  benztropine (COGENTIN) 1 MG tablet Take 1 mg by mouth daily. 1/2 tablet (0.5mg ) daily    Default, Provider, MD  carvedilol (COREG) 25 MG tablet Take 25 mg by mouth 2 (two) times daily with a meal.    [provider]  Cholecalciferol (VITAMIN D3) 5000 UNITS TABS Take 5,000 Units by mouth every morning.     [provider]  cloNIDine (CATAPRES) 0.1 MG tablet Take 1 tablet (0.1 mg total) by mouth 2 (two) times daily. 07/15/15   Loletha Grayer, MD  donepezil (ARICEPT) 10 MG tablet Take 10 mg by mouth at bedtime.    [provider]  lamoTRIgine (LAMICTAL) 100 MG tablet Take 100 mg by mouth at bedtime.    Josephina Gip  OLANZapine (ZYPREXA) 5 MG tablet Take 15 mg by mouth at bedtime.    [provider]  pantoprazole (PROTONIX) 40 MG tablet Take 1 tablet (40 mg total) by mouth daily. Patient taking differently: Take 40 mg by mouth every morning.  11/25/14   Wellington Hampshire, MD  rosuvastatin (CRESTOR) 20 MG tablet Take 20 mg by mouth at bedtime. For high cholesterol  [provider]  venlafaxine XR (EFFEXOR-XR) 75 MG 24 hr capsule Take 37.5 mg daily by mouth.     [provider]     Allergies Lovastatin  Family History  Problem Relation Age of Onset  . Hyperlipidemia Mother   . Breast cancer Mother 13  . Hypertension Mother   . Heart attack Mother        age 79's  . Hyperlipidemia Father   . Hypertension Father   . Heart attack Father 93  . Breast cancer Sister 41    Social History Social History   Tobacco Use  . Smoking status: Former Smoker    Packs/day: 1.00    Years: 5.00    Pack years: 5.00    Types: Cigarettes  . Smokeless tobacco: Never Used  . Tobacco comment: "quit smoking cigarettes in  the early 2000s"  Substance Use Topics  . Alcohol use: Yes    Alcohol/week: 0.0 oz    Comment: 07/15/2015 "nothing in the last few years"  . Drug use: No    Review of Systems  Constitutional: No fever/chills Eyes: No visual changes.  ENT: No sore throat. Cardiovascular: Denies chest pain. Respiratory: Denies shortness of breath. Gastrointestinal: No abdominal pain.  No nausea, no vomiting.   Genitourinary: Negative for dysuria. Musculoskeletal: Negative for back pain. Skin: Negative for rash. Neurological: Negative for headaches    ____________________________________________   PHYSICAL EXAM:  VITAL SIGNS: ED Triage Vitals  Enc Vitals Group     BP 01/11/17 1658 (!) 205/92     Pulse Rate 01/11/17 1658 (!) 122     Resp 01/11/17 1658 16     Temp 01/11/17 1658 98.2 F (36.8 C)     Temp Source 01/11/17 1658 Oral     SpO2 01/11/17 1658 98 %     Weight 01/11/17 1659 72.6 kg (160 lb)     Height 01/11/17 1659 1.549 m (5\' 1" )     Head Circumference --      Peak Flow --      Pain Score 01/11/17 1710 8     Pain Loc --      Pain Edu? --      Excl. in [place]? --     Constitutional: Alert and oriented. No acute distress. Eyes: Conjunctivae are normal.   Nose: No congestion/rhinnorhea. Mouth/Throat: Mucous membranes are moist.    Cardiovascular: Normal rate, regular rhythm. Grossly normal heart sounds.  Good peripheral circulation. Respiratory: Normal respiratory effort.  No retractions. Lungs CTAB. Gastrointestinal: Soft and nontender. No distention.  No CVA tenderness. Genitourinary: deferred Musculoskeletal:   Warm and well perfused Neurologic:  Normal speech and language. No gross focal neurologic deficits are appreciated.  Skin:  Skin is warm, dry and intact. No rash noted. Psychiatric: Mood and affect are normal. Speech and behavior are normal.  ____________________________________________   LABS (all labs ordered are listed, but only abnormal results are  displayed)  Labs Reviewed  BASIC METABOLIC PANEL - Abnormal; Notable for the following components:      Result Value   Potassium 3.0 (*)    Chloride 98 (*)    Glucose, Bld 103 (*)    All other components within normal limits  CBC - Abnormal; Notable for the following components:   RDW 16.1 (*)    All other components within normal limits  TROPONIN I   ____________________________________________  EKG  ED ECG REPORT I, Lavonia Drafts, the attending physician, personally viewed and interpreted this ECG.  Date: 01/11/2017  Rhythm: normal sinus rhythm QRS Axis: normal Intervals: normal ST/T Wave abnormalities: normal Narrative Interpretation: no evidence of acute ischemia  ____________________________________________  RADIOLOGY  Chest x-ray unremarkable ____________________________________________   PROCEDURES  Procedure(s) performed: No  Procedures   Critical Care performed: No ____________________________________________   INITIAL IMPRESSION / ASSESSMENT AND PLAN / ED COURSE  Pertinent labs & imaging results that were available during my care of the patient were reviewed by me and considered in my medical decision making (see chart for details).  Patient well-appearing in no acute distress.  She is significantly hypertensive and does have some tachycardia which I do believe is related to anxiety.  We will give IV fluids give 0.1 of clonidine and reevaluate  Lab work is unremarkable  After treatment patient's blood pressure improved significant heart rate also improved.  She feels better and I think at this point is appropriate for outpatient follow-up with her PCP    ____________________________________________   FINAL CLINICAL IMPRESSION(S) / ED DIAGNOSES  Final diagnoses:  Essential hypertension        Note:  This document was prepared using Dragon voice recognition software and may include unintentional dictation errors.   Lavonia Drafts,  MD 01/11/17 2055

## 2017-03-07 ENCOUNTER — Other Ambulatory Visit: Payer: Self-pay

## 2017-03-07 ENCOUNTER — Emergency Department
Admission: EM | Admit: 2017-03-07 | Discharge: 2017-03-07 | Disposition: A | Discharge status: Home / Self Care | Payer: Medicare HMO | Attending: Emergency Medicine | Admitting: Emergency Medicine

## 2017-03-07 ENCOUNTER — Emergency Department: Payer: Medicare HMO

## 2017-03-07 DIAGNOSIS — R569 Unspecified convulsions: Secondary | ICD-10-CM | POA: Diagnosis present

## 2017-03-07 DIAGNOSIS — G40909 Epilepsy, unspecified, not intractable, without status epilepticus: Secondary | ICD-10-CM | POA: Diagnosis not present

## 2017-03-07 DIAGNOSIS — Z79899 Other long term (current) drug therapy: Secondary | ICD-10-CM | POA: Diagnosis not present

## 2017-03-07 DIAGNOSIS — I10 Essential (primary) hypertension: Secondary | ICD-10-CM | POA: Insufficient documentation

## 2017-03-07 DIAGNOSIS — Z87891 Personal history of nicotine dependence: Secondary | ICD-10-CM | POA: Diagnosis not present

## 2017-03-07 DIAGNOSIS — Z7982 Long term (current) use of aspirin: Secondary | ICD-10-CM | POA: Insufficient documentation

## 2017-03-07 DIAGNOSIS — F250 Schizoaffective disorder, bipolar type: Secondary | ICD-10-CM | POA: Insufficient documentation

## 2017-03-07 DIAGNOSIS — Z8673 Personal history of transient ischemic attack (TIA), and cerebral infarction without residual deficits: Secondary | ICD-10-CM | POA: Insufficient documentation

## 2017-03-07 DIAGNOSIS — F419 Anxiety disorder, unspecified: Secondary | ICD-10-CM | POA: Diagnosis not present

## 2017-03-07 DIAGNOSIS — F329 Major depressive disorder, single episode, unspecified: Secondary | ICD-10-CM | POA: Diagnosis not present

## 2017-03-07 LAB — CBC
HEMATOCRIT: 43 % (ref 35.0–47.0)
Hemoglobin: 14.1 g/dL (ref 12.0–16.0)
MCH: 27.8 pg (ref 26.0–34.0)
MCHC: 32.9 g/dL (ref 32.0–36.0)
MCV: 84.4 fL (ref 80.0–100.0)
PLATELETS: 191 10*3/uL (ref 150–440)
RBC: 5.09 MIL/uL (ref 3.80–5.20)
RDW: 15.4 % — ABNORMAL HIGH (ref 11.5–14.5)
WBC: 14.2 10*3/uL — AB (ref 3.6–11.0)

## 2017-03-07 LAB — BASIC METABOLIC PANEL
Anion gap: 15 (ref 5–15)
BUN: 15 mg/dL (ref 6–20)
CHLORIDE: 100 mmol/L — AB (ref 101–111)
CO2: 21 mmol/L — ABNORMAL LOW (ref 22–32)
Calcium: 9.3 mg/dL (ref 8.9–10.3)
Creatinine, Ser: 1.11 mg/dL — ABNORMAL HIGH (ref 0.44–1.00)
GFR calc Af Amer: >60 mL/min (ref >60)
GFR calc non Af Amer: 53 mL/min — ABNORMAL LOW (ref >60)
Glucose, Bld: 128 mg/dL — ABNORMAL HIGH (ref 65–99)
POTASSIUM: 3.8 mmol/L (ref 3.5–5.1)
Sodium: 136 mmol/L (ref 135–145)

## 2017-03-07 LAB — GLUCOSE, CAPILLARY: GLUCOSE-CAPILLARY: 115 mg/dL — AB (ref 65–99)

## 2017-03-07 MED ORDER — LORAZEPAM 2 MG/ML IJ SOLN
INTRAMUSCULAR | Status: AC
  Administered 2017-03-07: 2 mg
  Filled 2017-03-07: qty 1

## 2017-03-07 MED ORDER — METOCLOPRAMIDE HCL 5 MG/ML IJ SOLN
10.0000 mg | Freq: Once | INTRAMUSCULAR | Status: AC
  Administered 2017-03-07: 10 mg via INTRAVENOUS
  Filled 2017-03-07: qty 2

## 2017-03-07 NOTE — ED Triage Notes (Signed)
Pt was post ictal on EMS arrival. Not following directions well and grabbing at things. Per EMS pt had a seizure and fell according to husband

## 2017-03-07 NOTE — ED Provider Notes (Signed)
[facility] Emergency Department Provider Note ____________________________________________   First MD Initiated Contact with Patient 03/07/17 2020     (approximate)  I have reviewed the triage vital signs and the nursing notes.   HISTORY  Chief Complaint Seizures and Fall  History of present illness limited due to altered mental status.  HPI Sandra Charles is a 62 y.o. female with past medical history as noted below including known seizure disorder who presents with apparent seizure and headache and possible fall.  Per EMS the patient reported to her family member that she had been having headache all day at the top of her head.  She then had a seizure and apparently fell onto the floor.  When EMS arrived they noted the patient to be confused, and they were also concerned that she had neglect; her gaze did not deviate past midline to the right side at all.  Per EMS, there is also a very strong smell of marijuana in her home.  It is unclear from the EMS history whether the patient to be compliant with her medications.  Patient is able to give limited history and does confirm that she has been having headache all day, but was unable to tell me much more.  Past Medical History:  Diagnosis Date  . Anxiety   . Arthritis    "all over"  . Chronic lower back pain   . Depression   . GERD (gastroesophageal reflux disease)   . Headache    "weekly" (07/15/2015)  . Hyperlipidemia   . Hypertension   . Mini stroke ([place])    "several since 05/2014" (07/15/2015)  . Seizures ([place]) dx'd 04/2015    Patient Active Problem List   Diagnosis Date Noted  . Seizure ([place])   . UTI (urinary tract infection) 08/17/2015  . Seizure disorder ([place]) 08/10/2015  . Cannabis use disorder, moderate, dependence ([place]) 08/10/2015  . Schizoaffective disorder, bipolar type ([place]) 08/10/2015  . Abnormal finding on MRI of brain   . Hypokalemia 07/15/2015  . Confusion   . GERD (gastroesophageal  reflux disease) 11/25/2014  . Essential hypertension 11/25/2014    Past Surgical History:  Procedure Laterality Date  . DILATION AND CURETTAGE OF UTERUS    . LUMBAR PUNCTURE  07/14/2015  . TUBAL LIGATION    . VAGINAL HYSTERECTOMY      Prior to Admission medications   Medication Sig Start Date End Date Taking? Authorizing Provider  amLODipine (NORVASC) 10 MG tablet Take 10 mg by mouth daily. For high blood pressure    [provider]  aspirin EC 81 MG tablet Take 81 mg by mouth every morning.     [provider]  benztropine (COGENTIN) 1 MG tablet Take 1 mg by mouth daily. 1/2 tablet (0.5mg ) daily    Default, Provider, MD  carvedilol (COREG) 25 MG tablet Take 25 mg by mouth 2 (two) times daily with a meal.    [provider]  Cholecalciferol (VITAMIN D3) 5000 UNITS TABS Take 5,000 Units by mouth every morning.     [provider]  cloNIDine (CATAPRES) 0.1 MG tablet Take 1 tablet (0.1 mg total) by mouth 2 (two) times daily. 07/15/15   Loletha Grayer, MD  donepezil (ARICEPT) 10 MG tablet Take 10 mg by mouth at bedtime.    [provider]  lamoTRIgine (LAMICTAL) 100 MG tablet Take 100 mg by mouth at bedtime.    Josephina Gip, MD  OLANZapine (ZYPREXA) 5 MG tablet Take 15 mg by  mouth at bedtime.    [provider]  pantoprazole (PROTONIX) 40 MG tablet Take 1 tablet (40 mg total) by mouth daily. Patient taking differently: Take 40 mg by mouth every morning.  11/25/14   Wellington Hampshire, MD  QUEtiapine (SEROQUEL) 50 MG tablet Take 50 mg at bedtime by mouth.    [provider]  rosuvastatin (CRESTOR) 20 MG tablet Take 20 mg by mouth at bedtime. For high cholesterol    [provider]  venlafaxine XR (EFFEXOR-XR) 75 MG 24 hr capsule Take 37.5 mg daily by mouth.     [provider]    Allergies Lovastatin  Family History  Problem Relation Age of Onset  . Hyperlipidemia Mother   . Breast cancer Mother 91  .  Hypertension Mother   . Heart attack Mother        age 35's  . Hyperlipidemia Father   . Hypertension Father   . Heart attack Father 70  . Breast cancer Sister 66    Social History Social History   Tobacco Use  . Smoking status: Former Smoker    Packs/day: 1.00    Years: 5.00    Pack years: 5.00    Types: Cigarettes  . Smokeless tobacco: Never Used  . Tobacco comment: "quit smoking cigarettes in the early 2000s"  Substance Use Topics  . Alcohol use: Yes    Alcohol/week: 0.0 oz    Comment: 07/15/2015 "nothing in the last few years"  . Drug use: No    Review of Systems Level V caveat: Unable to obtain review of systems due to altered mental status.   ____________________________________________   PHYSICAL EXAM:  VITAL SIGNS: ED Triage Vitals [03/07/17 2017]  Enc Vitals Group     BP      Pulse      Resp      Temp      Temp src      SpO2      Weight 180 lb (81.6 kg)     Height 5\' 2"  (1.575 m)     Head Circumference      Peak Flow      Pain Score      Pain Loc      Pain Edu?      Excl. in [place]?     Constitutional: Alert, confused appearing.  Somewhat combative, pushing RNs away as they attempted to place IV and monitor leads.   Eyes: Conjunctivae are normal.  EOMI.  PERRLA.  Gaze does cross midline. Head: Atraumatic. Nose: No congestion/rhinnorhea. Mouth/Throat: Mucous membranes are slightly dry.   Neck: Normal range of motion.  No cervical spine tenderness. Cardiovascular: Normal rate, regular rhythm. Grossly normal heart sounds.  Good peripheral circulation. Respiratory: Normal respiratory effort.  No retractions. Lungs CTAB. Gastrointestinal: No distention.  Genitourinary: No flank tenderness. Musculoskeletal:  Extremities warm and well perfused.  Neurologic: Motor intact in all extremities. Somewhat slurred speech Skin:  Skin is warm and dry. No rash noted. Psychiatric: Unable to assess.  ____________________________________________   LABS (all  labs ordered are listed, but only abnormal results are displayed)  Labs Reviewed  CBC - Abnormal; Notable for the following components:      Result Value   WBC 14.2 (*)    RDW 15.4 (*)    All other components within normal limits  BASIC METABOLIC PANEL - Abnormal; Notable for the following components:   Chloride 100 (*)    CO2 21 (*)    Glucose, Bld 128 (*)  Creatinine, Ser 1.11 (*)    GFR calc non Af Amer 53 (*)    All other components within normal limits  GLUCOSE, CAPILLARY - Abnormal; Notable for the following components:   Glucose-Capillary 115 (*)    All other components within normal limits  URINE DRUG SCREEN, QUALITATIVE ([facility] ONLY)  URINALYSIS, COMPLETE ([facility]) WITH MICROSCOPIC  CBG MONITORING, ED   ____________________________________________  EKG  ED ECG REPORT I, Arta Silence, the attending physician, personally viewed and interpreted this ECG.  Date: 03/07/2017 EKG Time: 2024 Rate: 80 Rhythm: normal sinus rhythm QRS Axis: normal Intervals: normal ST/T Wave abnormalities: Nonspecific T wave flattening,lateral leads Narrative Interpretation: no evidence of acute ischemia; no significant change when compared to EKG of 01/12/2017  ____________________________________________  RADIOLOGY  CT head: No ICH or other acute findings  ____________________________________________   PROCEDURES  Procedure(s) performed: No    Critical Care performed: No ____________________________________________   INITIAL IMPRESSION / ASSESSMENT AND PLAN / ED COURSE  Pertinent labs & imaging results that were available during my care of the patient were reviewed by me and considered in my medical decision making (see chart for details).  62 year old female with past medical history as noted above including known seizure disorder as well as a psych history presents with an apparent seizure with an associated fall, as well as complaining of a headache during the day.   History was provided and directly via EMS as patient is somewhat confused and unable to provide much history at this time.  Her husband who witnessed the fall is not present in the ED.  On exam, the patient appeared confused and mildly combative, consistent with a postictal state.  EMS had described that the patient apparently had some neglect or gaze would not cross midline, but at the time of my exam she was moving all extremities, speaking words mild slurring, and her cranial nerves also appear grossly intact.  There is no visible evidence of trauma, and the remainder the exam is relatively unremarkable.  Differential includes seizure with postictal state, subarachnoid or other spontaneous hemorrhage, trauma with resulting intracranial hemorrhage, or less likely dehydration or other metabolic cause, or infection/sepsis.  The patient was given Ativan due to her confusion and agitation to facilitate exam and for her safety, as well as to prevent recurrent seizure.  Plan: CT head, labs, and reassess.  ----------------------------------------- 10:00 PM on 03/07/2017 -----------------------------------------  CT head is negative.  On reassessment, the patient is somewhat somnolent, likely from the Ativan, but is arousable and able to answer questions.  She reported a persistent headache so I ordered Reglan.  We will continue to assess her mental status, and anticipate that when she is more awake she likely will be able to go home if the lab workup is unremarkable.  ----------------------------------------- 10:48 PM on 03/07/2017 -----------------------------------------  Patient is now a&ox4 and ambulating without difficulty.  She states that her headache has resolved.  Her lab workup is unremarkable except for slightly elevated WBC which is nonspecific and can easily be explained by recent seizure.  Patient denies any infectious symptoms.  BMP is unremarkable.  At this time there is no indication  for urinalysis since patient has no urinary symptoms.  Vital signs remained stable.  Explained discharge instructions and return precautions to the patient as well as to her son and niece who are present with her here and they expressed understanding.  I instructed them to follow-up with her neurologist and ensure that she is taking her medications  as prescribed.  ____________________________________________   FINAL CLINICAL IMPRESSION(S) / ED DIAGNOSES  Final diagnoses:  Seizure disorder ([place])      NEW MEDICATIONS STARTED DURING THIS VISIT:  New Prescriptions   No medications on file     Note:  This document was prepared using Dragon voice recognition software and may include unintentional dictation errors.   Arta Silence, MD 03/07/17 2250

## 2017-03-07 NOTE — ED Notes (Signed)
ED Provider at bedside. 

## 2017-03-07 NOTE — Discharge Instructions (Signed)
Return to the ER for new, recurrent, or persistent seizures, any other confusion or change in mental status, fevers, weakness or lightheadedness, severe headache, or any other new or worsening symptoms that concern you.  You should make sure to take your regular medications as prescribed.  Call your neurologist office in the next few days to arrange an appointment within the next 1-2 weeks if at all possible.

## 2017-10-05 ENCOUNTER — Inpatient Hospital Stay
Admission: EM | Admit: 2017-10-05 | Discharge: 2017-10-09 | DRG: 100 | Disposition: A | Discharge status: Home Health | Payer: Medicare HMO | Attending: Internal Medicine | Admitting: Internal Medicine

## 2017-10-05 ENCOUNTER — Other Ambulatory Visit: Payer: Self-pay

## 2017-10-05 ENCOUNTER — Emergency Department: Payer: Medicare HMO

## 2017-10-05 ENCOUNTER — Inpatient Hospital Stay: Payer: Medicare HMO

## 2017-10-05 ENCOUNTER — Encounter: Payer: Self-pay | Admitting: Emergency Medicine

## 2017-10-05 DIAGNOSIS — K219 Gastro-esophageal reflux disease without esophagitis: Secondary | ICD-10-CM | POA: Diagnosis present

## 2017-10-05 DIAGNOSIS — N3000 Acute cystitis without hematuria: Secondary | ICD-10-CM | POA: Diagnosis present

## 2017-10-05 DIAGNOSIS — F0280 Dementia in other diseases classified elsewhere without behavioral disturbance: Secondary | ICD-10-CM | POA: Diagnosis present

## 2017-10-05 DIAGNOSIS — F419 Anxiety disorder, unspecified: Secondary | ICD-10-CM | POA: Diagnosis present

## 2017-10-05 DIAGNOSIS — E7800 Pure hypercholesterolemia, unspecified: Secondary | ICD-10-CM | POA: Diagnosis present

## 2017-10-05 DIAGNOSIS — G40409 Other generalized epilepsy and epileptic syndromes, not intractable, without status epilepticus: Secondary | ICD-10-CM | POA: Diagnosis present

## 2017-10-05 DIAGNOSIS — G8929 Other chronic pain: Secondary | ICD-10-CM | POA: Diagnosis present

## 2017-10-05 DIAGNOSIS — Z8249 Family history of ischemic heart disease and other diseases of the circulatory system: Secondary | ICD-10-CM | POA: Diagnosis not present

## 2017-10-05 DIAGNOSIS — R569 Unspecified convulsions: Secondary | ICD-10-CM

## 2017-10-05 DIAGNOSIS — G9341 Metabolic encephalopathy: Secondary | ICD-10-CM | POA: Diagnosis present

## 2017-10-05 DIAGNOSIS — E785 Hyperlipidemia, unspecified: Secondary | ICD-10-CM | POA: Diagnosis present

## 2017-10-05 DIAGNOSIS — Z8349 Family history of other endocrine, nutritional and metabolic diseases: Secondary | ICD-10-CM | POA: Diagnosis not present

## 2017-10-05 DIAGNOSIS — Z888 Allergy status to other drugs, medicaments and biological substances status: Secondary | ICD-10-CM

## 2017-10-05 DIAGNOSIS — Z87891 Personal history of nicotine dependence: Secondary | ICD-10-CM | POA: Diagnosis not present

## 2017-10-05 DIAGNOSIS — R4182 Altered mental status, unspecified: Secondary | ICD-10-CM

## 2017-10-05 DIAGNOSIS — Z79899 Other long term (current) drug therapy: Secondary | ICD-10-CM

## 2017-10-05 DIAGNOSIS — Z803 Family history of malignant neoplasm of breast: Secondary | ICD-10-CM | POA: Diagnosis not present

## 2017-10-05 DIAGNOSIS — Z90710 Acquired absence of both cervix and uterus: Secondary | ICD-10-CM

## 2017-10-05 DIAGNOSIS — I10 Essential (primary) hypertension: Secondary | ICD-10-CM | POA: Diagnosis present

## 2017-10-05 DIAGNOSIS — R410 Disorientation, unspecified: Secondary | ICD-10-CM | POA: Diagnosis present

## 2017-10-05 DIAGNOSIS — F259 Schizoaffective disorder, unspecified: Secondary | ICD-10-CM | POA: Diagnosis present

## 2017-10-05 DIAGNOSIS — Z8673 Personal history of transient ischemic attack (TIA), and cerebral infarction without residual deficits: Secondary | ICD-10-CM | POA: Diagnosis not present

## 2017-10-05 DIAGNOSIS — Z7982 Long term (current) use of aspirin: Secondary | ICD-10-CM | POA: Diagnosis not present

## 2017-10-05 DIAGNOSIS — F250 Schizoaffective disorder, bipolar type: Secondary | ICD-10-CM | POA: Diagnosis present

## 2017-10-05 DIAGNOSIS — M159 Polyosteoarthritis, unspecified: Secondary | ICD-10-CM | POA: Diagnosis present

## 2017-10-05 DIAGNOSIS — N390 Urinary tract infection, site not specified: Secondary | ICD-10-CM | POA: Diagnosis present

## 2017-10-05 DIAGNOSIS — G3183 Dementia with Lewy bodies: Secondary | ICD-10-CM | POA: Diagnosis present

## 2017-10-05 DIAGNOSIS — M545 Low back pain: Secondary | ICD-10-CM | POA: Diagnosis present

## 2017-10-05 DIAGNOSIS — H518 Other specified disorders of binocular movement: Secondary | ICD-10-CM | POA: Diagnosis present

## 2017-10-05 LAB — URINALYSIS, COMPLETE (UACMP) WITH MICROSCOPIC
BILIRUBIN URINE: NEGATIVE
GLUCOSE, UA: NEGATIVE mg/dL
KETONES UR: 20 mg/dL — AB
Leukocytes, UA: NEGATIVE
Nitrite: NEGATIVE
PH: 6 (ref 5.0–8.0)
PROTEIN: 100 mg/dL — AB
SQUAMOUS EPITHELIAL / LPF: NONE SEEN (ref 0–5)
Specific Gravity, Urine: 1.025 (ref 1.005–1.030)

## 2017-10-05 LAB — CBC WITH DIFFERENTIAL/PLATELET
Basophils Absolute: 0 10*3/uL (ref 0–0.1)
Basophils Relative: 0 %
Eosinophils Absolute: 0 10*3/uL (ref 0–0.7)
Eosinophils Relative: 0 %
HEMATOCRIT: 44.6 % (ref 35.0–47.0)
HEMOGLOBIN: 14.7 g/dL (ref 12.0–16.0)
Lymphocytes Relative: 7 %
Lymphs Abs: 1 10*3/uL (ref 1.0–3.6)
MCH: 27.1 pg (ref 26.0–34.0)
MCHC: 33 g/dL (ref 32.0–36.0)
MCV: 82.2 fL (ref 80.0–100.0)
MONOS PCT: 2 %
Monocytes Absolute: 0.3 10*3/uL (ref 0.2–0.9)
NEUTROS ABS: 13.1 10*3/uL — AB (ref 1.4–6.5)
NEUTROS PCT: 91 %
Platelets: 261 10*3/uL (ref 150–440)
RBC: 5.42 MIL/uL — AB (ref 3.80–5.20)
RDW: 16.2 % — ABNORMAL HIGH (ref 11.5–14.5)
WBC: 14.5 10*3/uL — AB (ref 3.6–11.0)

## 2017-10-05 LAB — TROPONIN I: Troponin I: <0.03 ng/mL (ref <0.03)

## 2017-10-05 LAB — COMPREHENSIVE METABOLIC PANEL
ALK PHOS: 91 U/L (ref 38–126)
ALT: 66 U/L — AB (ref 0–44)
ANION GAP: 14 (ref 5–15)
AST: 75 U/L — AB (ref 15–41)
Albumin: 4.6 g/dL (ref 3.5–5.0)
BILIRUBIN TOTAL: 1.2 mg/dL (ref 0.3–1.2)
BUN: 16 mg/dL (ref 6–20)
CO2: 24 mmol/L (ref 22–32)
CREATININE: 0.78 mg/dL (ref 0.44–1.00)
Calcium: 9.8 mg/dL (ref 8.9–10.3)
Chloride: 100 mmol/L (ref 98–111)
GFR calc Af Amer: >60 mL/min (ref >60)
GFR calc non Af Amer: >60 mL/min (ref >60)
GLUCOSE: 114 mg/dL — AB (ref 70–99)
Potassium: 4.6 mmol/L (ref 3.5–5.1)
SODIUM: 138 mmol/L (ref 135–145)
TOTAL PROTEIN: 8.6 g/dL — AB (ref 6.5–8.1)

## 2017-10-05 LAB — ETHANOL: ALCOHOL ETHYL (B): 12 mg/dL — AB (ref <10)

## 2017-10-05 LAB — URINE DRUG SCREEN, QUALITATIVE (ARMC ONLY)
Amphetamines, Ur Screen: NOT DETECTED
Barbiturates, Ur Screen: NOT DETECTED
CANNABINOID 50 NG, UR ~~LOC~~: POSITIVE — AB
Cocaine Metabolite,Ur ~~LOC~~: NOT DETECTED
MDMA (Ecstasy)Ur Screen: NOT DETECTED
Methadone Scn, Ur: NOT DETECTED
OPIATE, UR SCREEN: NOT DETECTED
PHENCYCLIDINE (PCP) UR S: NOT DETECTED
Tricyclic, Ur Screen: NOT DETECTED

## 2017-10-05 MED ORDER — SODIUM CHLORIDE 0.9 % IV SOLN
2.0000 g | Freq: Once | INTRAVENOUS | Status: AC
  Administered 2017-10-05: 2 g via INTRAVENOUS
  Filled 2017-10-05: qty 2

## 2017-10-05 MED ORDER — ONDANSETRON HCL 4 MG PO TABS: 4.0000 mg | ORAL_TABLET | Freq: Four times a day (QID) | ORAL | Status: DC | PRN

## 2017-10-05 MED ORDER — ACETAMINOPHEN 650 MG RE SUPP: 650.0000 mg | Freq: Four times a day (QID) | RECTAL | Status: DC | PRN

## 2017-10-05 MED ORDER — CLONIDINE HCL 0.1 MG PO TABS
0.1000 mg | ORAL_TABLET | Freq: Two times a day (BID) | ORAL | Status: DC
  Administered 2017-10-05 – 2017-10-09 (×7): 0.1 mg via ORAL
  Filled 2017-10-05 (×8): qty 1

## 2017-10-05 MED ORDER — PROPRANOLOL HCL ER 60 MG PO CP24
60.0000 mg | ORAL_CAPSULE | Freq: Every day | ORAL | Status: DC
  Administered 2017-10-06 – 2017-10-09 (×4): 60 mg via ORAL
  Filled 2017-10-05 (×4): qty 1

## 2017-10-05 MED ORDER — LAMOTRIGINE 100 MG PO TABS
100.0000 mg | ORAL_TABLET | Freq: Every day | ORAL | Status: DC
  Administered 2017-10-05 – 2017-10-07 (×3): 100 mg via ORAL
  Filled 2017-10-05 (×4): qty 1

## 2017-10-05 MED ORDER — LORAZEPAM 2 MG/ML IJ SOLN
1.0000 mg | Freq: Once | INTRAMUSCULAR | Status: AC
  Administered 2017-10-05: 1 mg via INTRAVENOUS
  Filled 2017-10-05: qty 1

## 2017-10-05 MED ORDER — ENOXAPARIN SODIUM 40 MG/0.4ML ~~LOC~~ SOLN
40.0000 mg | SUBCUTANEOUS | Status: DC
  Administered 2017-10-05 – 2017-10-07 (×3): 40 mg via SUBCUTANEOUS
  Filled 2017-10-05 (×4): qty 0.4

## 2017-10-05 MED ORDER — LEVETIRACETAM IN NACL 1000 MG/100ML IV SOLN
1000.0000 mg | Freq: Once | INTRAVENOUS | Status: AC
  Administered 2017-10-05: 1000 mg via INTRAVENOUS
  Filled 2017-10-05: qty 100

## 2017-10-05 MED ORDER — ACETAMINOPHEN 325 MG PO TABS: 650.0000 mg | ORAL_TABLET | Freq: Four times a day (QID) | ORAL | Status: DC | PRN

## 2017-10-05 MED ORDER — PANTOPRAZOLE SODIUM 40 MG PO TBEC
40.0000 mg | DELAYED_RELEASE_TABLET | ORAL | Status: DC
  Administered 2017-10-06 – 2017-10-09 (×4): 40 mg via ORAL
  Filled 2017-10-05 (×4): qty 1

## 2017-10-05 MED ORDER — LORAZEPAM 2 MG/ML IJ SOLN: 2.0000 mg | INTRAMUSCULAR | Status: DC | PRN

## 2017-10-05 MED ORDER — ASPIRIN EC 81 MG PO TBEC
81.0000 mg | DELAYED_RELEASE_TABLET | ORAL | Status: DC
  Administered 2017-10-06 – 2017-10-09 (×4): 81 mg via ORAL
  Filled 2017-10-05 (×4): qty 1

## 2017-10-05 MED ORDER — AMLODIPINE BESYLATE 10 MG PO TABS
10.0000 mg | ORAL_TABLET | Freq: Every day | ORAL | Status: DC
  Administered 2017-10-06 – 2017-10-09 (×4): 10 mg via ORAL
  Filled 2017-10-05 (×3): qty 2
  Filled 2017-10-05: qty 1

## 2017-10-05 MED ORDER — CEFTRIAXONE SODIUM 1 G IJ SOLR
1.0000 g | INTRAMUSCULAR | Status: DC
  Filled 2017-10-05: qty 10

## 2017-10-05 MED ORDER — ONDANSETRON HCL 4 MG/2ML IJ SOLN: 4.0000 mg | Freq: Four times a day (QID) | INTRAMUSCULAR | Status: DC | PRN

## 2017-10-05 NOTE — ED Notes (Signed)
Present family members placed pt's gown from home in trash can because of it being dirty.

## 2017-10-05 NOTE — ED Notes (Signed)
Patient transported to MRI 

## 2017-10-05 NOTE — ED Notes (Signed)
Niece, Reuel Boom left her number [phone]

## 2017-10-05 NOTE — ED Notes (Addendum)
Family at bedside who reports pt hasn't felt good for a week. Family states pt's last seizure was 4 months prior.

## 2017-10-05 NOTE — ED Triage Notes (Signed)
Pt arrives from home after witnessed seizure by family.

## 2017-10-05 NOTE — ED Provider Notes (Signed)
[facility] Emergency Department Provider Note  Time seen: 4:31 PM  I have reviewed the triage vital signs and the nursing notes.   HISTORY  Chief Complaint Seizures    HPI Sandra Charles is a 62 y.o. female with a past medical history of anxiety, gastric reflux, hypertension, hyperlipidemia, per record review a history of schizophrenia, dementia possibly Lewy body dementia, possible seizure disorder on Lamictal who presents to the emergency department after a possible seizure today.  According to family member patient lives with her fianc, the family member checks on her frequently however states for the past 2 to 3 days the patient has remained largely in bed most of the day.  Today around 3:30 PM the patient had a generalized tonic-clonic seizure per family member describe shaking in all extremities.  Afterwards the patient has remained confused, is just moaning and saying "okay" but is not following commands or answering questions.  Family states they are not sure if the patient has been having seizures, she is seeing neurology at [facility] and they have not clearly said if the patient is having seizures or not.  Patient is on Lamictal which appears to be her only antiepileptic medication.  Patient is not able to follow commands or answer questions at this time.  Has a left gaze deviation.  Will occasionally answer "okay" to my questions..   Past Medical History:  Diagnosis Date  . Anxiety   . Arthritis    "all over"  . Chronic lower back pain   . Depression   . GERD (gastroesophageal reflux disease)   . Headache    "weekly" (07/15/2015)  . Hyperlipidemia   . Hypertension   . Mini stroke ([place])    "several since 05/2014" (07/15/2015)  . Seizures ([place]) dx'd 04/2015    Patient Active Problem List   Diagnosis Date Noted  . Seizure ([place])   . UTI (urinary tract infection) 08/17/2015  . Seizure disorder ([place]) 08/10/2015  . Cannabis use disorder, moderate, dependence  ([place]) 08/10/2015  . Schizoaffective disorder, bipolar type ([place]) 08/10/2015  . Abnormal finding on MRI of brain   . Hypokalemia 07/15/2015  . Confusion   . GERD (gastroesophageal reflux disease) 11/25/2014  . Essential hypertension 11/25/2014    Past Surgical History:  Procedure Laterality Date  . DILATION AND CURETTAGE OF UTERUS    . LUMBAR PUNCTURE  07/14/2015  . TUBAL LIGATION    . VAGINAL HYSTERECTOMY      Prior to Admission medications   Medication Sig Start Date End Date Taking? Authorizing Provider  amLODipine (NORVASC) 10 MG tablet Take 10 mg by mouth daily. For high blood pressure    [provider]  aspirin EC 81 MG tablet Take 81 mg by mouth every morning.     [provider]  benztropine (COGENTIN) 1 MG tablet Take 1 mg by mouth daily. 1/2 tablet (0.5mg ) daily    Default, Provider, MD  carvedilol (COREG) 25 MG tablet Take 25 mg by mouth 2 (two) times daily with a meal.    [provider]  Cholecalciferol (VITAMIN D3) 5000 UNITS TABS Take 5,000 Units by mouth every morning.     [provider]  cloNIDine (CATAPRES) 0.1 MG tablet Take 1 tablet (0.1 mg total) by mouth 2 (two) times daily. 07/15/15   Loletha Grayer, MD  donepezil (ARICEPT) 10 MG tablet Take 10 mg by mouth at bedtime.    [provider]  lamoTRIgine (LAMICTAL) 100 MG tablet Take 100  mg by mouth at bedtime.    Josephina Gip, MD  OLANZapine (ZYPREXA) 5 MG tablet Take 15 mg by mouth at bedtime.    [provider]  pantoprazole (PROTONIX) 40 MG tablet Take 1 tablet (40 mg total) by mouth daily. Patient taking differently: Take 40 mg by mouth every morning.  11/25/14   Wellington Hampshire, MD  QUEtiapine (SEROQUEL) 50 MG tablet Take 50 mg at bedtime by mouth.    [provider]  rosuvastatin (CRESTOR) 20 MG tablet Take 20 mg by mouth at bedtime. For high cholesterol    [provider]  venlafaxine XR (EFFEXOR-XR) 75 MG 24 hr capsule Take 37.5 mg  daily by mouth.     [provider]    Allergies  Allergen Reactions  . Lovastatin Other (See Comments)    Unknown reaction    Family History  Problem Relation Age of Onset  . Hyperlipidemia Mother   . Breast cancer Mother 90  . Hypertension Mother   . Heart attack Mother        age 42's  . Hyperlipidemia Father   . Hypertension Father   . Heart attack Father 71  . Breast cancer Sister 71    Social History Social History   Tobacco Use  . Smoking status: Former Smoker    Packs/day: 1.00    Years: 5.00    Pack years: 5.00    Types: Cigarettes  . Smokeless tobacco: Never Used  . Tobacco comment: "quit smoking cigarettes in the early 2000s"  Substance Use Topics  . Alcohol use: Yes    Alcohol/week: 0.0 standard drinks    Comment: 07/15/2015 "nothing in the last few years"  . Drug use: No    Review of systems: Unable to complete an adequate/accurate review of systems secondary to altered mental status ____________________________________________   PHYSICAL EXAM:  VITAL SIGNS: ED Triage Vitals  Enc Vitals Group     BP 10/05/17 1620 136/80     Pulse Rate 10/05/17 1620 92     Resp 10/05/17 1620 14     Temp 10/05/17 1620 99.9 F (37.7 C)     Temp Source 10/05/17 1620 Oral     SpO2 10/05/17 1620 93 %     Weight 10/05/17 1619 185 lb (83.9 kg)     Height 10/05/17 1619 5\' 4"  (1.626 m)     Head Circumference --      Peak Flow --      Pain Score --      Pain Loc --      Pain Edu? --      Excl. in [place]? --     Constitutional: Patient is awake, is looking around but only to the left side, will state "okay" occasionally will moan but is not following commands or answering questions. Eyes: Patient has a left gaze deviation unable to get the patient to look past midline to the right. ENT   Head: Normocephalic and atraumatic.   Mouth/Throat: Mucous membranes are moist. Cardiovascular: Normal rate, regular rhythm.  Respiratory: Normal respiratory effort  without tachypnea nor retractions. Breath sounds are clear Gastrointestinal: Soft and nontender. No distention.   Musculoskeletal: Nontender with normal range of motion in all extremities.  Neurologic: Patient is moaning will occasionally say "okay" appears to be moving her left extremity at least left upper extremity cannot get the patient to move her right upper extremity. Skin:  Skin is warm, dry and intact.    ____________________________________________  EKG  EKG reviewed and interpreted by myself shows normal sinus rhythm 86 bpm with a narrow QRS, normal axis, normal intervals, no concerning ST changes.  ____________________________________________    RADIOLOGY  CT scan negative for acute abnormality  ____________________________________________   INITIAL IMPRESSION / ASSESSMENT AND PLAN / ED COURSE  Pertinent labs & imaging results that were available during my care of the patient were reviewed by me and considered in my medical decision making (see chart for details).  Patient presents to the emergency department after a reported seizure at home.  Currently the patient is confused versus postictal.  Differential this time would include seizure with prolonged postictal state, CVA, ICH, metabolic abnormality, electrolyte abnormality, infectious etiology.  We will check labs, including a urinalysis.  Will obtain CT imaging of the head on an urgent basis.  We will dose 1 mg of Ativan as well.  Patient's current state does not appear consistent with continued seizure however given her left sided gaze deviation unwilling to look past the midline it is concerning for acute CVA.  CT scan largely negative.  Chest x-ray negative.  Patient's labs are resulted largely within normal limits besides a moderate leukocytosis.  Patient's urinalysis is just resulted showing many bacteria, this was a clean catheterized sample.  Given the patient's temperature 99.9 with likely urinary tract  infection we will place the patient on IV antibiotics.  Patient is more responsive but continues to be very confused.  Is looking to the right now which she was not doing earlier. Patient will be admitted to the hospital service for further treatment.  ____________________________________________   FINAL CLINICAL IMPRESSION(S) / ED DIAGNOSES  Urinary tract infection Confusion Altered mental status    Harvest Dark, MD 10/05/17 1924

## 2017-10-05 NOTE — H&P (Addendum)
[facility] [place] NAME: Sandra Charles    MR#:  [id number]  DATE OF BIRTH:  01/30/1956  DATE OF ADMISSION:  10/05/2017  PRIMARY CARE PHYSICIAN: Minda Ditto, MD   REQUESTING/REFERRING PHYSICIAN: Kerman Passey, MD  CHIEF COMPLAINT:   Chief Complaint  Patient presents with  . Seizures    HISTORY OF PRESENT ILLNESS:  Sandra Charles  is a 62 y.o. female who presents with chief complaint as above.  Patient has reportedly been lying around in her bed for the past couple of days, very inactive and very lethargic.  She was witnessed to have seizure-like activity today, tonic-clonic in nature.  She does have a history of seizure disorder and is on Lamictal, though it is unclear if she has been taking it this past few days.  She is unable to contribute any information to her history as she is only oriented to person.  It is unclear what her baseline mental function is.  Work-up in the ED is largely within normal limits, though there is concern for possible CVA versus seizure induced injury.  Hospitalist were called for admission  PAST MEDICAL HISTORY:   Past Medical History:  Diagnosis Date  . Anxiety   . Arthritis    "all over"  . Chronic lower back pain   . Depression   . GERD (gastroesophageal reflux disease)   . Headache    "weekly" (07/15/2015)  . Hyperlipidemia   . Hypertension   . Mini stroke ([place])    "several since 05/2014" (07/15/2015)  . Seizures ([place]) dx'd 04/2015     PAST SURGICAL HISTORY:   Past Surgical History:  Procedure Laterality Date  . DILATION AND CURETTAGE OF UTERUS    . LUMBAR PUNCTURE  07/14/2015  . TUBAL LIGATION    . VAGINAL HYSTERECTOMY       SOCIAL HISTORY:   Social History   Tobacco Use  . Smoking status: Former Smoker    Packs/day: 1.00    Years: 5.00    Pack years: 5.00    Types: Cigarettes  . Smokeless tobacco: Never Used  . Tobacco comment: "quit smoking cigarettes in the early 2000s"   Substance Use Topics  . Alcohol use: Yes    Alcohol/week: 0.0 standard drinks    Comment: 07/15/2015 "nothing in the last few years"     FAMILY HISTORY:   Family History  Problem Relation Age of Onset  . Hyperlipidemia Mother   . Breast cancer Mother 33  . Hypertension Mother   . Heart attack Mother        age 73's  . Hyperlipidemia Father   . Hypertension Father   . Heart attack Father 53  . Breast cancer Sister 37     DRUG ALLERGIES:   Allergies  Allergen Reactions  . Lovastatin Other (See Comments)    Unknown reaction    MEDICATIONS AT HOME:   Prior to Admission medications   Medication Sig Start Date End Date Taking? Authorizing Provider  amLODipine (NORVASC) 10 MG tablet Take 10 mg by mouth daily. For high blood pressure   Yes [provider]  aspirin EC 81 MG tablet Take 81 mg by mouth every morning.    Yes [provider]  Cholecalciferol (VITAMIN D3) 5000 UNITS TABS Take 5,000 Units by mouth every morning.    Yes [provider]  cloNIDine (CATAPRES) 0.1 MG tablet Take 1 tablet (0.1 mg total) by mouth 2 (two) times daily. 07/15/15  Yes Loletha Grayer, MD  donepezil (ARICEPT) 10 MG tablet Take 10 mg by mouth at bedtime.   Yes [provider]  lamoTRIgine (LAMICTAL) 100 MG tablet Take 100 mg by mouth at bedtime.   Yes Josephina Gip, MD  pantoprazole (PROTONIX) 40 MG tablet Take 1 tablet (40 mg total) by mouth daily. Patient taking differently: Take 40 mg by mouth every morning.  11/25/14  Yes Wellington Hampshire, MD  propranolol ER (INDERAL LA) 60 MG 24 hr capsule Take 60 mg by mouth daily. 09/15/17  Yes [provider]  QUEtiapine (SEROQUEL) 50 MG tablet Take 150 mg by mouth at bedtime.    Yes [provider]  rosuvastatin (CRESTOR) 20 MG tablet Take 20 mg by mouth at bedtime. For high cholesterol   Yes [provider]  sertraline (ZOLOFT) 100 MG tablet Take 100 mg by mouth daily. 08/30/17  Yes [provider]  venlafaxine XR (EFFEXOR-XR) 75 MG 24 hr capsule Take 37.5 mg daily by mouth.    Yes [provider]    REVIEW OF SYSTEMS:  Review of Systems  Constitutional: Negative for chills, fever, malaise/fatigue and weight loss.  HENT: Negative for ear pain, hearing loss and tinnitus.   Eyes: Negative for blurred vision, double vision, pain and redness.  Respiratory: Negative for cough, hemoptysis and shortness of breath.   Cardiovascular: Negative for chest pain, palpitations, orthopnea and leg swelling.  Gastrointestinal: Negative for abdominal pain, constipation, diarrhea, nausea and vomiting.  Genitourinary: Negative for dysuria, frequency and hematuria.  Musculoskeletal: Negative for back pain, joint pain and neck pain.  Skin:       No acne, rash, or lesions  Neurological: Negative for dizziness, tremors, focal weakness and weakness.  Endo/Heme/Allergies: Negative for polydipsia. Does not bruise/bleed easily.  Psychiatric/Behavioral: Negative for depression. The patient is not nervous/anxious and does not have insomnia.     Patient denies any pain or other symptoms on review of systems, however she is not entirely oriented and has little memory of what has happened to her, so review of systems information from her is unreliable VITAL SIGNS:   Vitals:   10/05/17 1630 10/05/17 1830 10/05/17 1900 10/05/17 1930  BP: 132/74 (!) 146/75 (!) 165/72 (!) 163/77  Pulse: 80 79 89 86  Resp: 15 16 15 18   Temp:      TempSrc:      SpO2: 92% 94% 96% 94%  Weight:      Height:       Wt Readings from Last 3 Encounters:  10/05/17 83.9 kg  03/07/17 81.6 kg  01/11/17 72.6 kg    PHYSICAL EXAMINATION:  Physical Exam  Vitals reviewed. Constitutional: She appears well-developed and well-nourished. No distress.  HENT:  Head: Normocephalic and atraumatic.  Mouth/Throat: Oropharynx is clear and moist.  Eyes: Pupils are equal, round, and reactive to light. Conjunctivae and EOM are  normal. No scleral icterus.  Neck: Normal range of motion. Neck supple. No JVD present. No thyromegaly present.  Cardiovascular: Normal rate, regular rhythm and intact distal pulses. Exam reveals no gallop and no friction rub.  No murmur heard. Respiratory: Effort normal and breath sounds normal. No respiratory distress. She has no wheezes. She has no rales.  GI: Soft. Bowel sounds are normal. She exhibits no distension. There is no tenderness.  Musculoskeletal: Normal range of motion. She exhibits no edema.  No arthritis, no gout  Lymphadenopathy:    She has no cervical adenopathy.  Neurological: She is alert. No cranial  nerve deficit.  No dysarthria, no aphasia, patient has some perseverates of thought she repeats questions, no focal weakness or other acute neurological abnormality noted  Skin: Skin is warm and dry. No rash noted. No erythema.  Psychiatric: She has a normal mood and affect.  Unable to fully assess, patient has some repetition and her questions and is not completely oriented, though she appears to be in a normal mood    LABORATORY PANEL:   CBC Recent Labs  Lab 10/05/17 1622  WBC 14.5*  HGB 14.7  HCT 44.6  PLT 261   ------------------------------------------------------------------------------------------------------------------  Chemistries  Recent Labs  Lab 10/05/17 1622  NA 138  K 4.6  CL 100  CO2 24  GLUCOSE 114*  BUN 16  CREATININE 0.78  CALCIUM 9.8  AST 75*  ALT 66*  ALKPHOS 91  BILITOT 1.2   ------------------------------------------------------------------------------------------------------------------  Cardiac Enzymes Recent Labs  Lab 10/05/17 1622  TROPONINI <0.03   ------------------------------------------------------------------------------------------------------------------  RADIOLOGY:  Ct Head Wo Contrast  Result Date: 10/05/2017 CLINICAL DATA:  Seizure today. Patient has a history of seizure disorder. EXAM: CT HEAD WITHOUT  CONTRAST TECHNIQUE: Contiguous axial images were obtained from the base of the skull through the vertex without intravenous contrast. COMPARISON:  March 07, 2017 FINDINGS: Brain: No evidence of acute infarction, hemorrhage, hydrocephalus, extra-axial collection or mass lesion/mass effect. Mild chronic bilateral periventricular white matter small vessel ischemic changes noted. Vascular: No hyperdense vessel or unexpected calcification. Skull: There is comminuted fracture of the left zygomatic arch. Sinuses/Orbits: No acute finding. Other: There is soft tissue and subcutaneous fat stranding/hematoma superficial to the left zygomatic arch IMPRESSION: No focal acute intracranial abnormality identified. Comminuted fracture of the left zygomatic arch with adjacent superficial soft tissue swelling and hematoma. Electronically Signed   By: Abelardo Diesel M.D.   On: 10/05/2017 17:20   Dg Chest Portable 1 View  Result Date: 10/05/2017 CLINICAL DATA:  Malaise for the past week. Ex-smoker. EXAM: PORTABLE CHEST 1 VIEW COMPARISON:  01/11/2017. FINDINGS: The cardiac silhouette remains near the upper limit of normal in size. The lungs remain clear with stable mild peribronchial thickening. Unremarkable bones. IMPRESSION: No acute abnormality.  Stable mild chronic bronchitic changes. Electronically Signed   By: Claudie Revering M.D.   On: 10/05/2017 16:58    EKG:   Orders placed or performed during the hospital encounter of 10/05/17  . EKG 12-Lead  . EKG 12-Lead    IMPRESSION AND PLAN:  Principal Problem:   Seizure ([place]) -we will continue patient's home dose of Lamictal, we have also given her a dose of IV Keppra upfront in addition to the Ativan she received.  PRN Ativan for seizure activity.  Neurology consult.  MRI brain tonight to rule out any stroke etiology or other CNS injury due to her seizure Active Problems:   UTI - UA suspicious for possible UTI and given her history of several days of lethargy/AMS, will  treat with antibiotics, urine culture sent   Essential hypertension -continue home meds   GERD (gastroesophageal reflux disease) -Home dose PPI   HLD (hyperlipidemia) -Home dose antilipid   Chart review performed and case discussed with ED provider. Labs, imaging and/or ECG reviewed by provider and discussed with patient/family. Management plans discussed with the patient and/or family.  DVT PROPHYLAXIS: SubQ lovenox   GI PROPHYLAXIS:  PPI   ADMISSION STATUS: Inpatient     CODE STATUS: Full Code Status History    Date Active Date Inactive Code Status Order ID Comments User  Context   08/10/2015 1428 08/18/2015 1610 Full Code [id number]  Gonzella Lex, MD Inpatient   07/15/2015 1504 07/19/2015 1638 Full Code [id number]  Albertine Patricia, MD Inpatient   07/12/2015 1644 07/15/2015 1504 Full Code [id number]  Lytle Butte, MD ED   05/14/2015 0255 05/15/2015 1458 Full Code [id number]  Saundra Shelling, MD Inpatient      TOTAL TIME TAKING CARE OF THIS PATIENT: 45 minutes.   Emilliano Dilworth [place] 10/05/2017, 8:48 PM  [facility]  Office  [phone]  CC: Primary care physician; Minda Ditto, MD  Note:  This document was prepared using Dragon voice recognition software and may include unintentional dictation errors.

## 2017-10-06 ENCOUNTER — Inpatient Hospital Stay: Payer: Medicare HMO

## 2017-10-06 DIAGNOSIS — R569 Unspecified convulsions: Secondary | ICD-10-CM

## 2017-10-06 LAB — CBC
HCT: 40.6 % (ref 35.0–47.0)
Hemoglobin: 13.5 g/dL (ref 12.0–16.0)
MCH: 27.1 pg (ref 26.0–34.0)
MCHC: 33.3 g/dL (ref 32.0–36.0)
MCV: 81.3 fL (ref 80.0–100.0)
Platelets: 214 10*3/uL (ref 150–440)
RBC: 4.99 MIL/uL (ref 3.80–5.20)
RDW: 16 % — ABNORMAL HIGH (ref 11.5–14.5)
WBC: 9.7 10*3/uL (ref 3.6–11.0)

## 2017-10-06 LAB — BASIC METABOLIC PANEL
Anion gap: 13 (ref 5–15)
BUN: 20 mg/dL (ref 6–20)
CO2: 25 mmol/L (ref 22–32)
Calcium: 9.9 mg/dL (ref 8.9–10.3)
Chloride: 101 mmol/L (ref 98–111)
Creatinine, Ser: 0.86 mg/dL (ref 0.44–1.00)
GFR calc Af Amer: >60 mL/min (ref >60)
GFR calc non Af Amer: >60 mL/min (ref >60)
Glucose, Bld: 89 mg/dL (ref 70–99)
Potassium: 4.1 mmol/L (ref 3.5–5.1)
Sodium: 139 mmol/L (ref 135–145)

## 2017-10-06 MED ORDER — GADOBENATE DIMEGLUMINE 529 MG/ML IV SOLN: 14.0000 mL | Freq: Once | INTRAVENOUS | Status: DC | PRN

## 2017-10-06 MED ORDER — DIAZEPAM 5 MG PO TABS
5.0000 mg | ORAL_TABLET | Freq: Once | ORAL | Status: AC
  Administered 2017-10-06: 5 mg via ORAL
  Filled 2017-10-06: qty 1

## 2017-10-06 NOTE — Plan of Care (Signed)
  Problem: Safety: Goal: Ability to remain free from injury will improve Outcome: Progressing   

## 2017-10-06 NOTE — Consult Note (Addendum)
Reason for Consult:Seizure Referring Physician: Salary  CC: Seizure  HPI: Sandra Charles is an 62 y.o. female with a history of psychiatric disease and seizures who presents with breakthrough seizures.  Patient had her psych meds decreased on the 29th.  For the past week has been in the bed.  Has been inactive and lethargic.  Per report of family does not get out of bed much at baseline either.  Was not taking her medications as prescribed prior to admission.   Seizure activity was witnessed.     Past Medical History:  Diagnosis Date  . Anxiety   . Arthritis    "all over"  . Chronic lower back pain   . Depression   . GERD (gastroesophageal reflux disease)   . Headache    "weekly" (07/15/2015)  . Hyperlipidemia   . Hypertension   . Mini stroke ([place])    "several since 05/2014" (07/15/2015)  . Seizures ([place]) dx'd 04/2015    Past Surgical History:  Procedure Laterality Date  . DILATION AND CURETTAGE OF UTERUS    . LUMBAR PUNCTURE  07/14/2015  . TUBAL LIGATION    . VAGINAL HYSTERECTOMY      Family History  Problem Relation Age of Onset  . Hyperlipidemia Mother   . Breast cancer Mother 22  . Hypertension Mother   . Heart attack Mother        age 58's  . Hyperlipidemia Father   . Hypertension Father   . Heart attack Father 94  . Breast cancer Sister 43    Social History:  reports that she has quit smoking. Her smoking use included cigarettes. She has a 5.00 pack-year smoking history. She has never used smokeless tobacco. She reports that she drinks alcohol. She reports that she does not use drugs.  Allergies  Allergen Reactions  . Lovastatin Other (See Comments)    Unknown reaction    Medications:  I have reviewed the patient's current medications. Prior to Admission:  Medications Prior to Admission  Medication Sig Dispense Refill Last Dose  . amLODipine (NORVASC) 10 MG tablet Take 10 mg by mouth daily. For high blood pressure   10/05/2017 at Unknown time  . aspirin EC 81  MG tablet Take 81 mg by mouth every morning.    10/05/2017 at Unknown time  . Cholecalciferol (VITAMIN D3) 5000 UNITS TABS Take 5,000 Units by mouth every morning.    10/05/2017 at Unknown time  . cloNIDine (CATAPRES) 0.1 MG tablet Take 1 tablet (0.1 mg total) by mouth 2 (two) times daily. 60 tablet 11 10/05/2017 at Unknown time  . donepezil (ARICEPT) 10 MG tablet Take 10 mg by mouth at bedtime.   10/04/2017 at Unknown time  . lamoTRIgine (LAMICTAL) 100 MG tablet Take 100 mg by mouth at bedtime.   10/04/2017 at Unknown time  . pantoprazole (PROTONIX) 40 MG tablet Take 1 tablet (40 mg total) by mouth daily. (Patient taking differently: Take 40 mg by mouth every morning. ) 30 tablet 11 10/05/2017 at Unknown time  . propranolol ER (INDERAL LA) 60 MG 24 hr capsule Take 60 mg by mouth daily.  11 10/05/2017 at Unknown time  . QUEtiapine (SEROQUEL) 50 MG tablet Take 150 mg by mouth at bedtime.    10/04/2017 at Unknown time  . rosuvastatin (CRESTOR) 20 MG tablet Take 20 mg by mouth at bedtime. For high cholesterol   10/04/2017 at Unknown time  . sertraline (ZOLOFT) 100 MG tablet Take 100 mg by mouth daily.  2  10/05/2017 at Unknown time  . venlafaxine XR (EFFEXOR-XR) 75 MG 24 hr capsule Take 37.5 mg daily by mouth.    10/05/2017 at Unknown time   Scheduled: . amLODipine  10 mg Oral Daily  . aspirin EC  81 mg Oral BH-q7a  . cloNIDine  0.1 mg Oral BID  . diazepam  5 mg Oral Once  . enoxaparin (LOVENOX) injection  40 mg Subcutaneous Q24H  . lamoTRIgine  100 mg Oral QHS  . pantoprazole  40 mg Oral BH-q7a  . propranolol ER  60 mg Oral Daily    ROS: History obtained from the patient and family  General ROS:  fatigue Psychological ROS: behavioral disorder Ophthalmic ROS: negative for - blurry vision, double vision, eye pain or loss of vision ENT ROS: negative for - epistaxis, nasal discharge, oral lesions, sore throat, tinnitus or vertigo Allergy and Immunology ROS: negative for - hives or itchy/watery  eyes Hematological and Lymphatic ROS: negative for - bleeding problems, bruising or swollen lymph nodes Endocrine ROS: negative for - galactorrhea, hair pattern changes, polydipsia/polyuria or temperature intolerance Respiratory ROS: negative for - cough, hemoptysis, shortness of breath or wheezing Cardiovascular ROS: negative for - chest pain, dyspnea on exertion, edema or irregular heartbeat Gastrointestinal ROS: negative for - abdominal pain, diarrhea, hematemesis, nausea/vomiting or stool incontinence Genito-Urinary ROS: negative for - dysuria, hematuria, incontinence or urinary frequency/urgency Musculoskeletal ROS: negative for - joint swelling or muscular weakness Neurological ROS: as noted in HPI Dermatological ROS: negative for rash and skin lesion changes  Physical Examination: Blood pressure (!) 155/62, pulse 90, temperature (!) 97.5 F (36.4 C), temperature source Oral, resp. rate 16, height 5\' 1"  (1.549 m), weight 69 kg, SpO2 99 %.  HEENT-  Normocephalic, no lesions, without obvious abnormality.  Normal external eye and conjunctiva.  Normal TM's bilaterally.  Normal auditory canals and external ears. Normal external nose, mucus membranes and septum.  Normal pharynx. Cardiovascular- S1, S2 normal, pulses palpable throughout   Lungs- chest clear, no wheezing, rales, normal symmetric air entry Abdomen- soft, non-tender; bowel sounds normal; no masses,  no organomegaly Extremities- no edema Lymph-no adenopathy palpable Musculoskeletal-right shoulder pain to palpation Skin-warm and dry, no hyperpigmentation, vitiligo, or suspicious lesions  Neurological Examination   Mental Status: Alert, oriented to name and place, thought content appropriate.  Speech fluent without evidence of aphasia.  Able to follow 3 step commands without difficulty.  Will not make eye contact and interacts little with examiner. Cranial Nerves: II: Discs flat bilaterally; Visual fields grossly normal, pupils  equal, round, reactive to light and accommodation III,IV, VI: ptosis not present, extra-ocular motions intact bilaterally V,VII: smile symmetric, facial light touch sensation normal bilaterally VIII: hearing normal bilaterally IX,X: gag reflex present XI: bilateral shoulder shrug XII: midline tongue extension Motor: Moves all extremities against gravity with no focal weakness noted Sensory: Pinprick and light touch intact throughout, bilaterally Deep Tendon Reflexes: 2+ and symmetric with absent AJ's bilaterally Plantars: Right: downgoing   Left: downgoing Cerebellar: Normal finger-to-nose and normal heel-to-shin testing bilaterally Gait: not tested due to safety concerns    Laboratory Studies:   Basic Metabolic Panel: Recent Labs  Lab 10/05/17 1622 10/06/17 0508  NA 138 139  K 4.6 4.1  CL 100 101  CO2 24 25  GLUCOSE 114* 89  BUN 16 20  CREATININE 0.78 0.86  CALCIUM 9.8 9.9    Liver Function Tests: Recent Labs  Lab 10/05/17 1622  AST 75*  ALT 66*  ALKPHOS 91  BILITOT 1.2  PROT 8.6*  ALBUMIN 4.6   No results for input(s): LIPASE, AMYLASE in the last 168 hours. No results for input(s): AMMONIA in the last 168 hours.  CBC: Recent Labs  Lab 10/05/17 1622 10/06/17 0508  WBC 14.5* 9.7  NEUTROABS 13.1*  --   HGB 14.7 13.5  HCT 44.6 40.6  MCV 82.2 81.3  PLT 261 214    Cardiac Enzymes: Recent Labs  Lab 10/05/17 1622  TROPONINI <0.03    BNP: Invalid input(s): POCBNP  CBG: No results for input(s): GLUCAP in the last 168 hours.  Microbiology: Results for orders placed or performed during the hospital encounter of 07/12/15  CULTURE, BLOOD (ROUTINE X 2) w Reflex to ID Panel     Status: None   Collection Time: 07/12/15  5:00 PM  Result Value Ref Range Status   Specimen Description BLOOD RIGHT HAND  Final   Special Requests BOTTLES DRAWN AEROBIC AND ANAEROBIC  10CC  Final   Culture NO GROWTH 5 DAYS  Final   Report Status 07/17/2015 FINAL  Final   CULTURE, BLOOD (ROUTINE X 2) w Reflex to ID Panel     Status: None   Collection Time: 07/12/15  5:49 PM  Result Value Ref Range Status   Specimen Description BLOOD LEFT ARM  Final   Special Requests BOTTLES DRAWN AEROBIC AND ANAEROBIC  10CC  Final   Culture NO GROWTH 5 DAYS  Final   Report Status 07/17/2015 FINAL  Final  CSF culture     Status: None   Collection Time: 07/14/15  3:37 PM  Result Value Ref Range Status   Specimen Description CSF  Final   Special Requests Normal  Final   Gram Stain   Final    WBC PRESENT,BOTH PMN AND MONONUCLEAR NO ORGANISMS SEEN CYTOSPIN SMEAR Results Called to: AMY DALTON AT 6283 07/14/15 MLZ    Culture NO GROWTH 3 DAYS  Final   Report Status 07/17/2015 FINAL  Final    Coagulation Studies: No results for input(s): LABPROT, INR in the last 72 hours.  Urinalysis:  Recent Labs  Lab 10/05/17 1833  COLORURINE YELLOW*  LABSPEC 1.025  PHURINE 6.0  GLUCOSEU NEGATIVE  HGBUR MODERATE*  BILIRUBINUR NEGATIVE  KETONESUR 20*  PROTEINUR 100*  NITRITE NEGATIVE  LEUKOCYTESUR NEGATIVE    Lipid Panel:     Component Value Date/Time   CHOL 108 08/10/2015 1520   TRIG 92 08/10/2015 1520   HDL 42 08/10/2015 1520   CHOLHDL 2.6 08/10/2015 1520   VLDL 18 08/10/2015 1520   LDLCALC 48 08/10/2015 1520    HgbA1C:  Lab Results  Component Value Date   HGBA1C 6.1 (H) 08/10/2015    Urine Drug Screen:      Component Value Date/Time   LABOPIA NONE DETECTED 10/05/2017 1833   COCAINSCRNUR NONE DETECTED 10/05/2017 1833   LABBENZ TEST NOT PERFORMED, REAGENT NOT AVAILABLE (A) 10/05/2017 1833   AMPHETMU NONE DETECTED 10/05/2017 1833   THCU POSITIVE (A) 10/05/2017 1833   LABBARB NONE DETECTED 10/05/2017 1833    Alcohol Level:  Recent Labs  Lab 10/05/17 1622  ETH 12*    Other results: EKG: sinus rhythm at 86 bpm.  Imaging: Ct Head Wo Contrast  Result Date: 10/05/2017 CLINICAL DATA:  Seizure today. Patient has a history of seizure disorder. EXAM: CT  HEAD WITHOUT CONTRAST TECHNIQUE: Contiguous axial images were obtained from the base of the skull through the vertex without intravenous contrast. COMPARISON:  March 07, 2017 FINDINGS: Brain: No evidence of acute  infarction, hemorrhage, hydrocephalus, extra-axial collection or mass lesion/mass effect. Mild chronic bilateral periventricular white matter small vessel ischemic changes noted. Vascular: No hyperdense vessel or unexpected calcification. Skull: There is comminuted fracture of the left zygomatic arch. Sinuses/Orbits: No acute finding. Other: There is soft tissue and subcutaneous fat stranding/hematoma superficial to the left zygomatic arch IMPRESSION: No focal acute intracranial abnormality identified. Comminuted fracture of the left zygomatic arch with adjacent superficial soft tissue swelling and hematoma. Electronically Signed   By: Abelardo Diesel M.D.   On: 10/05/2017 17:20   Dg Chest Portable 1 View  Result Date: 10/05/2017 CLINICAL DATA:  Malaise for the past week. Ex-smoker. EXAM: PORTABLE CHEST 1 VIEW COMPARISON:  01/11/2017. FINDINGS: The cardiac silhouette remains near the upper limit of normal in size. The lungs remain clear with stable mild peribronchial thickening. Unremarkable bones. IMPRESSION: No acute abnormality.  Stable mild chronic bronchitic changes. Electronically Signed   By: Claudie Revering M.D.   On: 10/05/2017 16:58     Assessment/Plan: 62 year old female with a history of psych issues and seizures who presents with breakthrough seizures.  Patient does not appear to have been compliant with medications.  Has had a change in psych medications recently as well which per family has negatively impacted patient.  Head CT reviewed and shows no acute changes.  MR brain pending.    Recommendations: 1. Would continue Lamictal at prescribed doses.   2. Psych to evaluate for review of medications 3. PT consult 4. Seizure precautions 5. Ativan prn seizure activity  6. At  discharge family wishes patient to follow up with [facility], Dr. Hillery Jacks, MD Neurology [phone] 10/06/2017, 1:41 PM

## 2017-10-06 NOTE — Plan of Care (Signed)
  Problem: Education: Goal: Knowledge of General Education information will improve Description Including pain rating scale, medication(s)/side effects and non-pharmacologic comfort measures Outcome: Progressing   Problem: Health Behavior/Discharge Planning: Goal: Ability to manage health-related needs will improve Outcome: Progressing   Problem: Clinical Measurements: Goal: Ability to maintain clinical measurements within normal limits will improve Outcome: Progressing   Problem: Activity: Goal: Risk for activity intolerance will decrease Outcome: Progressing   Problem: Safety: Goal: Ability to remain free from injury will improve Outcome: Progressing   Problem: Urinary Elimination: Goal: Signs and symptoms of infection will decrease Outcome: Progressing

## 2017-10-06 NOTE — Progress Notes (Addendum)
[place] [facility] [place] NAME: Sandra Charles    MR#:  [id number]  DATE OF BIRTH:  01-09-1956  SUBJECTIVE:  CHIEF COMPLAINT:   Chief Complaint  Patient presents with  . Seizures  Patient without complaint, patient is confused/disoriented, neurology to see, follow-up on MRI and EEG  REVIEW OF SYSTEMS:  CONSTITUTIONAL: No fever, fatigue or weakness.  EYES: No blurred or double vision.  EARS, NOSE, AND THROAT: No tinnitus or ear pain.  RESPIRATORY: No cough, shortness of breath, wheezing or hemoptysis.  CARDIOVASCULAR: No chest pain, orthopnea, edema.  GASTROINTESTINAL: No nausea, vomiting, diarrhea or abdominal pain.  GENITOURINARY: No dysuria, hematuria.  ENDOCRINE: No polyuria, nocturia,  HEMATOLOGY: No anemia, easy bruising or bleeding SKIN: No rash or lesion. MUSCULOSKELETAL: No joint pain or arthritis.   NEUROLOGIC: No tingling, numbness, weakness.  PSYCHIATRY: No anxiety or depression.   ROS  DRUG ALLERGIES:   Allergies  Allergen Reactions  . Lovastatin Other (See Comments)    Unknown reaction    VITALS:  Blood pressure (!) 155/62, pulse 90, temperature (!) 97.5 F (36.4 C), temperature source Oral, resp. rate 16, height 5\' 1"  (1.549 m), weight 69 kg, SpO2 99 %.  PHYSICAL EXAMINATION:  GENERAL:  62 y.o.-year-old patient lying in the bed with no acute distress.  EYES: Pupils equal, round, reactive to light and accommodation. No scleral icterus. Extraocular muscles intact.  HEENT: Head atraumatic, normocephalic. Oropharynx and nasopharynx clear.  NECK:  Supple, no jugular venous distention. No thyroid enlargement, no tenderness.  LUNGS: Normal breath sounds bilaterally, no wheezing, rales,rhonchi or crepitation. No use of accessory muscles of respiration.  CARDIOVASCULAR: S1, S2 normal. No murmurs, rubs, or gallops.  ABDOMEN: Soft, nontender, nondistended. Bowel sounds present. No organomegaly or mass.  EXTREMITIES: No pedal edema,  cyanosis, or clubbing.  NEUROLOGIC: Cranial nerves II through XII are intact. Muscle strength 5/5 in all extremities. Sensation intact. Gait not checked.  PSYCHIATRIC: The patient is alert and oriented x 3.  SKIN: No obvious rash, lesion, or ulcer.   Physical Exam LABORATORY PANEL:   CBC Recent Labs  Lab 10/06/17 0508  WBC 9.7  HGB 13.5  HCT 40.6  PLT 214   ------------------------------------------------------------------------------------------------------------------  Chemistries  Recent Labs  Lab 10/05/17 1622 10/06/17 0508  NA 138 139  K 4.6 4.1  CL 100 101  CO2 24 25  GLUCOSE 114* 89  BUN 16 20  CREATININE 0.78 0.86  CALCIUM 9.8 9.9  AST 75*  --   ALT 66*  --   ALKPHOS 91  --   BILITOT 1.2  --    ------------------------------------------------------------------------------------------------------------------  Cardiac Enzymes Recent Labs  Lab 10/05/17 1622  TROPONINI <0.03   ------------------------------------------------------------------------------------------------------------------  RADIOLOGY:  Ct Head Wo Contrast  Result Date: 10/05/2017 CLINICAL DATA:  Seizure today. Patient has a history of seizure disorder. EXAM: CT HEAD WITHOUT CONTRAST TECHNIQUE: Contiguous axial images were obtained from the base of the skull through the vertex without intravenous contrast. COMPARISON:  March 07, 2017 FINDINGS: Brain: No evidence of acute infarction, hemorrhage, hydrocephalus, extra-axial collection or mass lesion/mass effect. Mild chronic bilateral periventricular white matter small vessel ischemic changes noted. Vascular: No hyperdense vessel or unexpected calcification. Skull: There is comminuted fracture of the left zygomatic arch. Sinuses/Orbits: No acute finding. Other: There is soft tissue and subcutaneous fat stranding/hematoma superficial to the left zygomatic arch IMPRESSION: No focal acute intracranial abnormality identified. Comminuted fracture of the  left zygomatic arch with adjacent superficial soft tissue  swelling and hematoma. Electronically Signed   By: Abelardo Diesel M.D.   On: 10/05/2017 17:20   Dg Chest Portable 1 View  Result Date: 10/05/2017 CLINICAL DATA:  Malaise for the past week. Ex-smoker. EXAM: PORTABLE CHEST 1 VIEW COMPARISON:  01/11/2017. FINDINGS: The cardiac silhouette remains near the upper limit of normal in size. The lungs remain clear with stable mild peribronchial thickening. Unremarkable bones. IMPRESSION: No acute abnormality.  Stable mild chronic bronchitic changes. Electronically Signed   By: Claudie Revering M.D.   On: 10/05/2017 16:58    ASSESSMENT AND PLAN:   * Acute recurrent seizure  Stable - noted confusion/disorientation Continue Lamictal, Keppra, PRN Ativan, neurology to see, seizure precautions, physical therapy/speech to evaluate/treat, and continue close medical monitoring  *Chronic encephalopathy Noted history of schizoaffective disorder, history of strokes, history of TIAs Increase nursing care PRN, aspiration/fall/skin care precautions while in house  *Abnormal urinalysis Not suspicious for UTI Discontinue antibiotics and follow-up on urine culture  *Chronic benign essential hypertension Stable on current regiment  *Chronic GERD without esophagitis PPI daily  *Chronic hyperlipidemia, unspecified Stable on current regiment  Disposition pending clinical course, possible discharge in 1 to 2 days barring any complications  All the records are reviewed and case discussed with Care Management/Social Workerr. Management plans discussed with the patient, family and they are in agreement.  CODE STATUS: full  TOTAL TIME TAKING CARE OF THIS PATIENT: 35 minutes.     POSSIBLE D/C IN 1-2 DAYS, DEPENDING ON CLINICAL CONDITION.   Avel Peace Quinton Voth M.D on 10/06/2017   Between 7am to 6pm - Pager - [phone]  After 6pm go to [email or web address] - password [email or web address]   Office  [phone]  CC: Primary care physician; Minda Ditto, MD  Note: This dictation was prepared with Dragon dictation along with smaller phrase technology. Any transcriptional errors that result from this process are unintentional.

## 2017-10-06 NOTE — Progress Notes (Signed)
Admission BP 137/105.  Gave Catapres 0.1 mg po at 2255 and BP increased to 149/105.  BP taken while pt is calm, resting quiet. Discussed with Dr Jannifer Franklin and will allow hypertension at this time secondary to concern for possible CVA. Dorna Bloom RN

## 2017-10-07 ENCOUNTER — Inpatient Hospital Stay: Payer: Medicare HMO

## 2017-10-07 LAB — AMMONIA: AMMONIA: 20 umol/L (ref 9–35)

## 2017-10-07 MED ORDER — DONEPEZIL HCL 5 MG PO TABS
10.0000 mg | ORAL_TABLET | Freq: Every day | ORAL | Status: DC
  Filled 2017-10-07 (×3): qty 2

## 2017-10-07 MED ORDER — SERTRALINE HCL 50 MG PO TABS
100.0000 mg | ORAL_TABLET | Freq: Every day | ORAL | Status: DC
  Administered 2017-10-08 – 2017-10-09 (×2): 100 mg via ORAL
  Filled 2017-10-07 (×2): qty 2

## 2017-10-07 MED ORDER — LORAZEPAM 2 MG/ML IJ SOLN
1.0000 mg | Freq: Once | INTRAMUSCULAR | Status: AC
  Administered 2017-10-07: 1 mg via INTRAVENOUS
  Filled 2017-10-07: qty 1

## 2017-10-07 MED ORDER — LORAZEPAM 2 MG/ML IJ SOLN
0.5000 mg | Freq: Four times a day (QID) | INTRAMUSCULAR | Status: DC | PRN
  Administered 2017-10-07: 0.5 mg via INTRAVENOUS
  Filled 2017-10-07 (×2): qty 1

## 2017-10-07 MED ORDER — QUETIAPINE FUMARATE 25 MG PO TABS
150.0000 mg | ORAL_TABLET | Freq: Every day | ORAL | Status: DC
  Administered 2017-10-07: 22:00:00 150 mg via ORAL
  Filled 2017-10-07 (×2): qty 6

## 2017-10-07 MED ORDER — SODIUM CHLORIDE 0.9 % IV SOLN
1.0000 g | Freq: Every day | INTRAVENOUS | Status: DC
  Administered 2017-10-07 – 2017-10-08 (×2): 1 g via INTRAVENOUS
  Filled 2017-10-07 (×2): qty 1

## 2017-10-07 NOTE — Progress Notes (Signed)
[facility] NAME: Sandra Charles    MR#:  [id number]  DATE OF BIRTH:  [birth year]  SUBJECTIVE:  CHIEF COMPLAINT:   Chief Complaint  Patient presents with  . Seizures   -Still confused than baseline.  Son at bedside.  REVIEW OF SYSTEMS:  Review of Systems  Constitutional: Negative for chills, fever and malaise/fatigue.  HENT: Negative for congestion, ear discharge, hearing loss and nosebleeds.   Eyes: Negative for blurred vision and double vision.  Respiratory: Negative for cough, shortness of breath and wheezing.   Cardiovascular: Negative for chest pain and palpitations.  Gastrointestinal: Negative for abdominal pain, constipation, diarrhea, nausea and vomiting.  Genitourinary: Negative for dysuria.  Musculoskeletal: Negative for myalgias.  Neurological: Negative for dizziness, focal weakness, seizures, weakness and headaches.  Psychiatric/Behavioral: Positive for hallucinations.       Confused    DRUG ALLERGIES:   Allergies  Allergen Reactions  . Lovastatin Other (See Comments)    Unknown reaction    VITALS:  Blood pressure 139/74, pulse 74, temperature 97.7 F (36.5 C), temperature source Oral, resp. rate 18, height 5\' 1"  (1.549 m), weight 69 kg, SpO2 100 %.  PHYSICAL EXAMINATION:  Physical Exam  GENERAL:  62 y.o.-year-old patient lying in the bed with no acute distress.  EYES: Pupils equal, round, reactive to light and accommodation. No scleral icterus. Extraocular muscles intact.  HEENT: Head atraumatic, normocephalic. Oropharynx and nasopharynx clear.  NECK:  Supple, no jugular venous distention. No thyroid enlargement, no tenderness.  LUNGS: Normal breath sounds bilaterally, no wheezing, rales,rhonchi or crepitation. No use of accessory muscles of respiration.  CARDIOVASCULAR: S1, S2 normal. No murmurs, rubs, or gallops.  ABDOMEN: Soft, nontender, nondistended. Bowel sounds present. No organomegaly or mass.    EXTREMITIES: No pedal edema, cyanosis, or clubbing.  NEUROLOGIC: Cranial nerves II through XII are intact. Muscle strength 5/5 in all extremities. Sensation intact. Gait not checked.  PSYCHIATRIC: The patient is alert and oriented x 2.  SKIN: No obvious rash, lesion, or ulcer.    LABORATORY PANEL:   CBC Recent Labs  Lab 10/06/17 0508  WBC 9.7  HGB 13.5  HCT 40.6  PLT 214   ------------------------------------------------------------------------------------------------------------------  Chemistries  Recent Labs  Lab 10/05/17 1622 10/06/17 0508  NA 138 139  K 4.6 4.1  CL 100 101  CO2 24 25  GLUCOSE 114* 89  BUN 16 20  CREATININE 0.78 0.86  CALCIUM 9.8 9.9  AST 75*  --   ALT 66*  --   ALKPHOS 91  --   BILITOT 1.2  --    ------------------------------------------------------------------------------------------------------------------  Cardiac Enzymes Recent Labs  Lab 10/05/17 1622  TROPONINI <0.03   ------------------------------------------------------------------------------------------------------------------  RADIOLOGY:  Ct Head Wo Contrast  Result Date: 10/05/2017 CLINICAL DATA:  Seizure today. Patient has a history of seizure disorder. EXAM: CT HEAD WITHOUT CONTRAST TECHNIQUE: Contiguous axial images were obtained from the base of the skull through the vertex without intravenous contrast. COMPARISON:  March 07, 2017 FINDINGS: Brain: No evidence of acute infarction, hemorrhage, hydrocephalus, extra-axial collection or mass lesion/mass effect. Mild chronic bilateral periventricular white matter small vessel ischemic changes noted. Vascular: No hyperdense vessel or unexpected calcification. Skull: There is comminuted fracture of the left zygomatic arch. Sinuses/Orbits: No acute finding. Other: There is soft tissue and subcutaneous fat stranding/hematoma superficial to the left zygomatic arch IMPRESSION: No focal acute intracranial abnormality identified.  Comminuted fracture of the left zygomatic arch with adjacent superficial soft tissue  swelling and hematoma. Electronically Signed   By: Abelardo Diesel M.D.   On: 10/05/2017 17:20   Mr Brain Wo Contrast  Result Date: 10/06/2017 CLINICAL DATA:  Focal neuro deficit, greater than 6 hours stroke suspected. Left gaze deviation. Possible seizures. EXAM: MRI HEAD WITHOUT CONTRAST TECHNIQUE: Multiplanar, multiecho pulse sequences of the brain and surrounding structures were obtained without intravenous contrast. COMPARISON:  CT head without contrast 10/05/2017. MRI brain 10/07/2015. FINDINGS: Brain: The diffusion-weighted images demonstrate no acute or subacute infarct. Remote lacunar infarcts are present within the right thalamus. Periventricular white matter changes are mildly advanced for age. There is no significant atrophy. Hemorrhage or mass lesion is present. No significant extraaxial fluid collection is present. White matter changes extend into the brainstem. Cerebellum is unremarkable. The internal auditory canals are normal bilaterally. Vascular: Flow is present in the major intracranial arteries. Skull and upper cervical spine: The skull base is within normal limits. Craniocervical junction is normal. Sinuses/Orbits: The paranasal sinuses and mastoid air cells are clear. The globes and orbits are within normal limits bilaterally. IMPRESSION: 1. Acute or focal intracranial abnormality to explain patient's seizures. 2. Remote lacunar infarct of the right thalamus. 3. Periventricular white matter changes are mildly advanced for age. In concert with white matter changes extending into the brainstem, this likely represents premature microvascular ischemia. Electronically Signed   By: San Morelle M.D.   On: 10/06/2017 20:09    EKG:   Orders placed or performed during the hospital encounter of 10/05/17  . EKG 12-Lead  . EKG 12-Lead    ASSESSMENT AND PLAN:   62 year old female with past medical  history significant for seizure disorder, schizophrenia, arthritis, hypertension and depression presents to hospital secondary to worsening confusion and lethargy.  1.  Altered mental status-likely metabolic encephalopathy. -Also recently psych meds have been adjusted. -Noted to have urinary tract infection. -No new seizures diagnosed.  EEG is negative -Appreciate neurology consult. -MRI showing no acute abnormalities.  Has remote lacunar infarct. -Improving mentation.  Ammonia levels  2.  Acute cystitis-cultures are growing gram-negative rods. -Continue Rocephin.  WBC is improving  3..  Schizoaffective disorder-with some level of confusion at baseline according to family.  Also prior history of strokes and TIAs causing some vascular dementia changes. -Still not at baseline according to family.  Continue to monitor -Medications.  4.  Hypertension-continue clonidine, propranolol and Norvasc  5.  Seizure disorder-no active seizures.  EEG is negative.  Continue Lamictal -Continue outpatient follow-up with neurology  6.  DVT prophylaxis-on Lovenox  Physical therapy recommended home with home health Updated son at bedside    All the records are reviewed and case discussed with Care Management/Social Workerr. Management plans discussed with the patient, family and they are in agreement.  CODE STATUS: Full Code  TOTAL TIME TAKING CARE OF THIS PATIENT: 39 minutes.   POSSIBLE D/C IN 2 DAYS, DEPENDING ON CLINICAL CONDITION.   Gladstone Lighter M.D on 10/07/2017 at 4:41 PM  Between 7am to 6pm - Pager - [phone]  After 6pm go to [email or web address] - password [email or web address] [facility]  Office  [phone]  CC: Primary care physician; Minda Ditto, MD

## 2017-10-07 NOTE — Progress Notes (Signed)
Subjective: Patient more alert and cooperative today but confused.  Family reports that patient is often confused at baseline.  No further seizures noted.  Objective: Current vital signs: BP 139/74   Pulse 74   Temp 97.7 F (36.5 C) (Oral)   Resp 18   Ht 5\' 1"  (1.549 m)   Wt 69 kg   SpO2 100%   BMI 28.74 kg/m  Vital signs in last 24 hours: Temp:  [97.7 F (36.5 C)-98.5 F (36.9 C)] 97.7 F (36.5 C) (08/12 0412) Pulse Rate:  [69-74] 74 (08/12 0949) Resp:  [12-18] 18 (08/12 0412) BP: (126-144)/(55-74) 139/74 (08/12 0949) SpO2:  [95 %-100 %] 100 % (08/12 0949)  Intake/Output from previous day: 08/11 0701 - 08/12 0700 In: 240 [P.O.:240] Out: -  Intake/Output this shift: Total I/O In: 193.3 [IV Piggyback:193.3] Out: -  Nutritional status:  Diet Order            Diet Heart Room service appropriate? Yes; Fluid consistency: Thin  Diet effective now              Neurologic Exam: Mental Status: Alert, oriented to name.  Thinks she is at [facility] and possibly at an outpatient clinic.  Speech fluent without evidence of aphasia.  Able to follow 3 step commands without difficulty.   Cranial Nerves: II: Discs flat bilaterally; Visual fields grossly normal, pupils equal, round, reactive to light and accommodation III,IV, VI: ptosis not present, extra-ocular motions intact bilaterally V,VII: smile symmetric, facial light touch sensation normal bilaterally VIII: hearing normal bilaterally IX,X: gag reflex present XI: bilateral shoulder shrug XII: midline tongue extension Motor: Moves all extremities against gravity with no focal weakness noted Sensory: Pinprick and light touch intact throughout, bilaterally    Lab Results: Basic Metabolic Panel: Recent Labs  Lab 10/05/17 1622 10/06/17 0508  NA 138 139  K 4.6 4.1  CL 100 101  CO2 24 25  GLUCOSE 114* 89  BUN 16 20  CREATININE 0.78 0.86  CALCIUM 9.8 9.9    Liver Function Tests: Recent Labs  Lab 10/05/17 1622  AST  75*  ALT 66*  ALKPHOS 91  BILITOT 1.2  PROT 8.6*  ALBUMIN 4.6   No results for input(s): LIPASE, AMYLASE in the last 168 hours. No results for input(s): AMMONIA in the last 168 hours.  CBC: Recent Labs  Lab 10/05/17 1622 10/06/17 0508  WBC 14.5* 9.7  NEUTROABS 13.1*  --   HGB 14.7 13.5  HCT 44.6 40.6  MCV 82.2 81.3  PLT 261 214    Cardiac Enzymes: Recent Labs  Lab 10/05/17 1622  TROPONINI <0.03    Lipid Panel: No results for input(s): CHOL, TRIG, HDL, CHOLHDL, VLDL, LDLCALC in the last 168 hours.  CBG: No results for input(s): GLUCAP in the last 168 hours.  Microbiology: Results for orders placed or performed during the hospital encounter of 10/05/17  Urine culture     Status: Abnormal (Preliminary result)   Collection Time: 10/05/17  6:33 PM  Result Value Ref Range Status   Specimen Description   Final    URINE, CATHETERIZED Performed at [facility], [address] [place], [place] [address]    Special Requests   Final    NONE Performed at [facility], [address] [place], [place] [address]    Culture >=100,000 COLONIES/mL KLEBSIELLA PNEUMONIAE (A)  Final   Report Status PENDING  Incomplete    Coagulation Studies: No results for input(s): LABPROT, INR in the last 72  hours.  Imaging: Ct Head Wo Contrast  Result Date: 10/05/2017 CLINICAL DATA:  Seizure today. Patient has a history of seizure disorder. EXAM: CT HEAD WITHOUT CONTRAST TECHNIQUE: Contiguous axial images were obtained from the base of the skull through the vertex without intravenous contrast. COMPARISON:  March 07, 2017 FINDINGS: Brain: No evidence of acute infarction, hemorrhage, hydrocephalus, extra-axial collection or mass lesion/mass effect. Mild chronic bilateral periventricular white matter small vessel ischemic changes noted. Vascular: No hyperdense vessel or unexpected calcification. Skull: There is comminuted fracture of the left zygomatic arch.  Sinuses/Orbits: No acute finding. Other: There is soft tissue and subcutaneous fat stranding/hematoma superficial to the left zygomatic arch IMPRESSION: No focal acute intracranial abnormality identified. Comminuted fracture of the left zygomatic arch with adjacent superficial soft tissue swelling and hematoma. Electronically Signed   By: Abelardo Diesel M.D.   On: 10/05/2017 17:20   Mr Brain Wo Contrast  Result Date: 10/06/2017 CLINICAL DATA:  Focal neuro deficit, greater than 6 hours stroke suspected. Left gaze deviation. Possible seizures. EXAM: MRI HEAD WITHOUT CONTRAST TECHNIQUE: Multiplanar, multiecho pulse sequences of the brain and surrounding structures were obtained without intravenous contrast. COMPARISON:  CT head without contrast 10/05/2017. MRI brain 10/07/2015. FINDINGS: Brain: The diffusion-weighted images demonstrate no acute or subacute infarct. Remote lacunar infarcts are present within the right thalamus. Periventricular white matter changes are mildly advanced for age. There is no significant atrophy. Hemorrhage or mass lesion is present. No significant extraaxial fluid collection is present. White matter changes extend into the brainstem. Cerebellum is unremarkable. The internal auditory canals are normal bilaterally. Vascular: Flow is present in the major intracranial arteries. Skull and upper cervical spine: The skull base is within normal limits. Craniocervical junction is normal. Sinuses/Orbits: The paranasal sinuses and mastoid air cells are clear. The globes and orbits are within normal limits bilaterally. IMPRESSION: 1. Acute or focal intracranial abnormality to explain patient's seizures. 2. Remote lacunar infarct of the right thalamus. 3. Periventricular white matter changes are mildly advanced for age. In concert with white matter changes extending into the brainstem, this likely represents premature microvascular ischemia. Electronically Signed   By: San Morelle M.D.   On:  10/06/2017 20:09   Dg Chest Portable 1 View  Result Date: 10/05/2017 CLINICAL DATA:  Malaise for the past week. Ex-smoker. EXAM: PORTABLE CHEST 1 VIEW COMPARISON:  01/11/2017. FINDINGS: The cardiac silhouette remains near the upper limit of normal in size. The lungs remain clear with stable mild peribronchial thickening. Unremarkable bones. IMPRESSION: No acute abnormality.  Stable mild chronic bronchitic changes. Electronically Signed   By: Claudie Revering M.D.   On: 10/05/2017 16:58    Medications:  I have reviewed the patient's current medications. Scheduled: . amLODipine  10 mg Oral Daily  . aspirin EC  81 mg Oral BH-q7a  . cloNIDine  0.1 mg Oral BID  . enoxaparin (LOVENOX) injection  40 mg Subcutaneous Q24H  . lamoTRIgine  100 mg Oral QHS  . pantoprazole  40 mg Oral BH-q7a  . propranolol ER  60 mg Oral Daily    Assessment/Plan: Patient more alert today but confused which appears to be baseline.  MRI of the brain reviewed and shows no acute changes. No further seizures noted.  Recommendations: 1. Agree with continued home doses of Lamictal.      LOS: 2 days   Alexis Goodell, MD Neurology [phone] 10/07/2017  1:09 PM

## 2017-10-07 NOTE — Evaluation (Signed)
Clinical/Bedside Swallow Evaluation Patient Details  Name: Sandra Charles MRN: [id number] Date of Birth: 05/20/1955  Today's Date: 10/07/2017 Time: SLP Start Time (ACUTE ONLY): 1000 SLP Stop Time (ACUTE ONLY): 1030 SLP Time Calculation (min) (ACUTE ONLY): 30 min  Past Medical History:  Past Medical History:  Diagnosis Date  . Anxiety   . Arthritis    "all over"  . Chronic lower back pain   . Depression   . GERD (gastroesophageal reflux disease)   . Headache    "weekly" (07/15/2015)  . Hyperlipidemia   . Hypertension   . Mini stroke ([place])    "several since 05/2014" (07/15/2015)  . Seizures ([place]) dx'd 04/2015   Past Surgical History:  Past Surgical History:  Procedure Laterality Date  . DILATION AND CURETTAGE OF UTERUS    . LUMBAR PUNCTURE  07/14/2015  . TUBAL LIGATION    . VAGINAL HYSTERECTOMY     HPI:  Per admitting H&P: Sandra Charles  is a 62 y.o. female who presents with chief complaint as above.  Patient has reportedly been lying around in her bed for the past couple of days, very inactive and very lethargic.  She was witnessed to have seizure-like activity today, tonic-clonic in nature.  She does have a history of seizure disorder and is on Lamictal, though it is unclear if she has been taking it this past few days.  She is unable to contribute any information to her history as she is only oriented to person.  It is unclear what her baseline mental function is.  Work-up in the ED is largely within normal limits, though there is concern for possible CVA versus seizure induced injury   Assessment / Plan / Recommendation Clinical Impression  Patient presents with functional swallowing abilities at bedside. No s/s aspiration observed with any consistency tested. Oral phase WFL. Noted adequate oral prep and A-P transit with all consistencies tested. No oral residue with any consistency. Oral mech exam WFL. Noted timely initiation of swallow and clear vocal quality throughout evaluation.  Laryngeal elevation appeared adequate. Nursing and patient report toleration of current regular diet with no s/s aspiration.  Recommend continue with Regular diet with thin liquids. Meds may be given whole with thin liquid. SLP to sign off at this time, no needs identified. Please reconsult with any future changes. SLP Visit Diagnosis: Dysphagia, unspecified (R13.10)    Aspiration Risk  No limitations    Diet Recommendation Regular;Thin liquid   Liquid Administration via: Cup;Straw Medication Administration: Whole meds with liquid Supervision: Patient able to self feed Postural Changes: Seated upright at 90 degrees;Remain upright for at least 30 minutes after po intake    Other  Recommendations Oral Care Recommendations: Oral care BID   Follow up Recommendations None      Frequency and Duration (N/A, SLP to sign off, no needs identified)          Prognosis Prognosis for Safe Diet Advancement: Good      Swallow Study   General Date of Onset: 10/07/17 HPI: Per admitting H&P: Sandra Charles  is a 62 y.o. female who presents with chief complaint as above.  Patient has reportedly been lying around in her bed for the past couple of days, very inactive and very lethargic.  She was witnessed to have seizure-like activity today, tonic-clonic in nature.  She does have a history of seizure disorder and is on Lamictal, though it is unclear if she has been taking it this past few days.  She is  unable to contribute any information to her history as she is only oriented to person.  It is unclear what her baseline mental function is.  Work-up in the ED is largely within normal limits, though there is concern for possible CVA versus seizure induced injury Type of Study: Bedside Swallow Evaluation Diet Prior to this Study: Regular;Thin liquids Temperature Spikes Noted: No Respiratory Status: Room air History of Recent Intubation: No Behavior/Cognition: Alert;Cooperative;Pleasant mood;Confused Oral  Cavity Assessment: Within Functional Limits Oral Care Completed by SLP: No Oral Cavity - Dentition: Adequate natural dentition Self-Feeding Abilities: Able to feed self Patient Positioning: Upright in bed Baseline Vocal Quality: Normal Volitional Cough: Strong Volitional Swallow: Able to elicit    Oral/Motor/Sensory Function Overall Oral Motor/Sensory Function: Within functional limits   Ice Chips Ice chips: Within functional limits Presentation: Spoon   Thin Liquid Thin Liquid: Within functional limits Presentation: Cup;Self Fed;Straw    Nectar Thick Nectar Thick Liquid: Not tested   Honey Thick Honey Thick Liquid: Not tested   Puree Puree: Within functional limits Presentation: Self Fed;Spoon   Solid     Solid: Within functional limits Presentation: Self Fed      Debria Broecker, MA, CCC-SLP 10/07/2017,12:30 PM

## 2017-10-07 NOTE — Evaluation (Signed)
Physical Therapy Evaluation Patient Details Name: Sandra Charles MRN: [id number] DOB: 01/14/1956 Today's Date: 10/07/2017   History of Present Illness  62 y.o. female with a past medical history of anxiety, gastric reflux, hypertension, hyperlipidemia, per record review a history of schizophrenia, CVA, dementia possibly Lewy body dementia, possible seizure disorder on Lamictal who presents to the emergency department after a possible seizure  Clinical Impression  Patient A&Ox3 at start of session with no complaints of pain, no family at bedside to confirm baseline cognitive functioning. Patient unaware of why she is in the hospital, needs questions repeated intermittently during session. Patient reports living in 1 story home with husband, states her husband goes to work during the day but has a son who can also check on her close by. Reports independent prior to admission, does not use any devices.  The patient demonstrated bed mobility mod I, able to sit EOB with intermittent UE support but unable to tolerate minimal challenge. 1st sit <> stand without AD, patient was CGA/minAx1 due to LOB/staggering to L. 2nd sit <> stand and ambulation ~33ft in room with RW, stability improved, however patient still exhibits staggering/leaning to L, poor AD management, and decreased safety awareness. 1 LOB during ambulation with minAx1 from PT to correct. The patient demonstrates deficits in endurance, activity tolerance, balance, gait and mobility, and safety awareness and would benefit from further skilled PT to address these issues. Current recommendation is home with HHPT and supervision 24/7, including mobility/OOB. Patient states family is able to provide assistance 24/7, no family at bedside to confirm.     Follow Up Recommendations Home health PT;Supervision/Assistance - 24 hour;Supervision for mobility/OOB    Equipment Recommendations  Rolling walker with 5" wheels    Recommendations for Other Services        Precautions / Restrictions Precautions Precautions: Fall Restrictions Weight Bearing Restrictions: No      Mobility  Bed Mobility Overal bed mobility: Modified Independent                Transfers Overall transfer level: Needs assistance Equipment used: None;Rolling walker (2 wheeled) Transfers: Sit to/from Stand Sit to Stand: Min guard;Min assist         General transfer comment: sit to stand with no device, minAx1 to maintain standing balance, CGA with RW 2nd attempt  Ambulation/Gait Ambulation/Gait assistance: Min guard;Min assist Gait Distance (Feet): 25 Feet Assistive device: Rolling walker (2 wheeled) Gait Pattern/deviations: Staggering left;Drifts right/left     General Gait Details: decreased speed, unstable, slightly impulsive with movements. Cued for RW use, safer turn techniques, 1LOB noted, PT minAx1 to correct  Stairs            Wheelchair Mobility    Modified Rankin (Stroke Patients Only)       Balance Overall balance assessment: Needs assistance   Sitting balance-Leahy Scale: Poor       Standing balance-Leahy Scale: Poor                               Pertinent Vitals/Pain Pain Assessment: No/denies pain    Home Living Family/patient expects to be discharged to:: Private residence Living Arrangements: Spouse/significant other Available Help at Discharge: Family;Available 24 hours/day Type of Home: Mobile home Home Access: Stairs to enter Entrance Stairs-Rails: Can reach both Entrance Stairs-Number of Steps: 4 in front with rails on both sides Home Layout: One level Home Equipment: Cane - single point  Prior Function Level of Independence: Independent               Hand Dominance   Dominant Hand: Left    Extremity/Trunk Assessment   Upper Extremity Assessment Upper Extremity Assessment: Generalized weakness;RUE deficits/detail;LUE deficits/detail RUE Deficits / Details:  3+/5  grossly LUE Deficits / Details: 3+/5 grossly    Lower Extremity Assessment Lower Extremity Assessment: Generalized weakness;RLE deficits/detail;LLE deficits/detail RLE Deficits / Details: 4-/5 grossly LLE Deficits / Details: 4-/5 grossly       Communication   Communication: No difficulties  Cognition Arousal/Alertness: Awake/alert Behavior During Therapy: WFL for tasks assessed/performed Overall Cognitive Status: No family/caregiver present to determine baseline cognitive functioning                                 General Comments: Patient with history of dementia per chart review,  no family at bedside to determine baseline cognitive functioning. A&Ox3      General Comments      Exercises     Assessment/Plan    PT Assessment Patient needs continued PT services  PT Problem List Decreased strength;Decreased knowledge of use of DME;Decreased activity tolerance;Decreased safety awareness;Decreased balance;Decreased mobility;Decreased coordination       PT Treatment Interventions DME instruction;Balance training;Gait training;Neuromuscular re-education;Stair training;Functional mobility training;Patient/family education;Therapeutic activities;Therapeutic exercise    PT Goals (Current goals can be found in the Care Plan section)  Acute Rehab PT Goals Patient Stated Goal: Patient would like to return home PT Goal Formulation: With patient Time For Goal Achievement: 10/21/17 Potential to Achieve Goals: Good    Frequency Min 2X/week   Barriers to discharge        Co-evaluation               AM-PAC PT "6 Clicks" Daily Activity  Outcome Measure Difficulty turning over in bed (including adjusting bedclothes, sheets and blankets)?: None Difficulty moving from lying on back to sitting on the side of the bed? : None Difficulty sitting down on and standing up from a chair with arms (e.g., wheelchair, bedside commode, etc,.)?: Unable Help needed moving to  and from a bed to chair (including a wheelchair)?: A Little Help needed walking in hospital room?: A Little Help needed climbing 3-5 steps with a railing? : A Lot 6 Click Score: 17    End of Session Equipment Utilized During Treatment: Gait belt Activity Tolerance: Patient tolerated treatment well Patient left: with chair alarm set;in chair Nurse Communication: Mobility status PT Visit Diagnosis: Other abnormalities of gait and mobility (R26.89);Difficulty in walking, not elsewhere classified (R26.2);Unsteadiness on feet (R26.81)    Time: 7829-5621 PT Time Calculation (min) (ACUTE ONLY): 23 min   Charges:   PT Evaluation $PT Eval Low Complexity: 1 Low PT Treatments $Gait Training: 8-22 mins        Lieutenant Diego PT, DPT 10:05 AM,10/07/17 [phone]

## 2017-10-07 NOTE — Progress Notes (Signed)
eeg completed ° °

## 2017-10-07 NOTE — Procedures (Signed)
ELECTROENCEPHALOGRAM REPORT   Patient: Sandra Charles       Room #: 131A-AA EEG No. ID: [id number] Age: 62 y.o.        Sex: female Referring Physician: Tressia Miners Report Date:  10/07/2017        Interpreting Physician: Alexis Goodell  History: Sandra Charles is an 62 y.o. female with a history of seizures presenting with breakthrough seizures  Medications:  Norvasc, ASA, Rocephin, Catapres, Lamictal, Inderal  Conditions of Recording:  This is a 21 channel routine scalp EEG performed with bipolar and monopolar montages arranged in accordance to the international 10/20 system of electrode placement. One channel was dedicated to EKG recording.  The patient is in the awake and drowsy states.  Description:  Artifact is prominent throughout the recording often obscuring the background rhythm.  When the posterior background rhythm can be evaluated it consists of a 6-7 Hz theta activity at its maximum but is often slower.  There is a mixture of theta and alpha rhythms are seen from the central and temporal regions with theta activity being most prominent.   The patient does appear to have some drowse activity with the background activity slowing further.   Stage II sleep is not obtained.  No epileptiform activity is noted.   Hyperventilation was not performed. Intermittent photic stimulation was performed but failed to illicit any change in the tracing.   IMPRESSION: This EEG is characterized by slowing which may represent a general cerebral disturbance such as a metabolic encephalopathy but can not rule out the possibility of normal drowse.  Clinical correlation recommended.  No epileptiform activity is noted.       Alexis Goodell, MD Neurology [phone] 10/07/2017, 4:25 PM

## 2017-10-07 NOTE — Plan of Care (Signed)
  Problem: Education: Goal: Knowledge of General Education information will improve Description Including pain rating scale, medication(s)/side effects and non-pharmacologic comfort measures Outcome: Progressing   Problem: Health Behavior/Discharge Planning: Goal: Ability to manage health-related needs will improve Outcome: Progressing   Problem: Clinical Measurements: Goal: Ability to maintain clinical measurements within normal limits will improve Outcome: Progressing   Problem: Activity: Goal: Risk for activity intolerance will decrease Outcome: Progressing   Problem: Safety: Goal: Ability to remain free from injury will improve Outcome: Progressing  Problem: Urinary Elimination: Goal: Signs and symptoms of infection will decrease Outcome: Progressing

## 2017-10-07 NOTE — Plan of Care (Signed)
  Problem: Education: Goal: Knowledge of General Education information will improve Description Including pain rating scale, medication(s)/side effects and non-pharmacologic comfort measures Outcome: Progressing   Problem: Health Behavior/Discharge Planning: Goal: Ability to manage health-related needs will improve Outcome: Progressing   Problem: Clinical Measurements: Goal: Ability to maintain clinical measurements within normal limits will improve Outcome: Progressing   Problem: Activity: Goal: Risk for activity intolerance will decrease Outcome: Progressing   Problem: Safety: Goal: Ability to remain free from injury will improve Outcome: Progressing   Problem: Urinary Elimination: Goal: Signs and symptoms of infection will decrease Outcome: Progressing

## 2017-10-08 LAB — URINE CULTURE

## 2017-10-08 LAB — HIV ANTIBODY (ROUTINE TESTING W REFLEX): HIV SCREEN 4TH GENERATION: NONREACTIVE

## 2017-10-08 MED ORDER — ROSUVASTATIN CALCIUM 20 MG PO TABS
20.0000 mg | ORAL_TABLET | Freq: Every day | ORAL | Status: DC
  Filled 2017-10-08: qty 1

## 2017-10-08 MED ORDER — CEPHALEXIN 500 MG PO CAPS
500.0000 mg | ORAL_CAPSULE | Freq: Two times a day (BID) | ORAL | Status: DC
  Administered 2017-10-09: 500 mg via ORAL
  Filled 2017-10-08: qty 1

## 2017-10-08 MED ORDER — VITAMIN D 1000 UNITS PO TABS
5000.0000 [IU] | ORAL_TABLET | Freq: Every day | ORAL | Status: DC
  Administered 2017-10-09: 10:00:00 5000 [IU] via ORAL
  Filled 2017-10-08: qty 5

## 2017-10-08 NOTE — Consult Note (Signed)
[place] Psychiatry Consult   Reason for Consult:  Medication management Referring Physician:  Dr. Doy Mince Patient Identification: Sandra Charles MRN:  [id number] Principal Diagnosis: Seizure [facility]) Diagnosis:   Patient Active Problem List   Diagnosis Date Noted  . Schizoaffective disorder, bipolar type ([place]) [F25.0] 08/10/2015    Priority: High  . HLD (hyperlipidemia) [E78.5] 10/05/2017  . Seizure ([place]) [R56.9]   . UTI (urinary tract infection) [N39.0] 08/17/2015  . Seizure disorder ([place]) [F74.944] 08/10/2015  . Cannabis use disorder, moderate, dependence ([place]) [F12.20] 08/10/2015  . Abnormal finding on MRI of brain [R90.89]   . Hypokalemia [E87.6] 07/15/2015  . Confusion [R41.0]   . GERD (gastroesophageal reflux disease) [K21.9] 11/25/2014  . Essential hypertension [I10] 11/25/2014    Total Time spent with patient: 1 hour   Identifying data. Sandra Charles is a 62 year old female with a history of seizure disorder, dementia and schizoaffective disorder.  Chief complaint. The patient unable to state.  History of present illness. Information was obtained from the patient and the chart. The patient was brought to the ER after an episode of witnessed seizures with postictal confusion. She has a history of seizures treated with Lamictal and last episode 4 month ago. Per chart, the patient has a history of schizoaffective disorder treated with low dose Seroquel and Zoloft. She is on Aricept for cognitive decline. She has been on this regimen at least since April 2019 per [facility] records. I tried to contact her niece for more information but was unable to reach her.  Psychiatry was asked to review her medications.   The patient herself is unable to provide much information. She is calm but able to answer the simplest questions only. She is oriented to person only. She does not remember her medications or her doctors. She does not know how she ended up in the hospital. She denies any  symptoms of depression, anxiety or psychosis. She is not suicidal or homicidal. Per her nurse report, the patient has visual hallucinations of her husband. There are no behavioral problems.  Past psychiatric history. There diagnoses of dementia, possibly Lewy body and schizoaffective disorder. Her regimen of Zoloft 100 mg and Seroquel XR 50 mg nightly is in keeping. She gets Aricept for dementia.   Family psychiatric history. Unknown.  Social history. Apparently lives with family.  Risk to Self:   Risk to Others:   Prior Inpatient Therapy:   Prior Outpatient Therapy:    Past Medical History:  Past Medical History:  Diagnosis Date  . Anxiety   . Arthritis    "all over"  . Chronic lower back pain   . Depression   . GERD (gastroesophageal reflux disease)   . Headache    "weekly" (07/15/2015)  . Hyperlipidemia   . Hypertension   . Mini stroke ([place])    "several since 05/2014" (07/15/2015)  . Seizures ([place]) dx'd 04/2015    Past Surgical History:  Procedure Laterality Date  . DILATION AND CURETTAGE OF UTERUS    . LUMBAR PUNCTURE  07/14/2015  . TUBAL LIGATION    . VAGINAL HYSTERECTOMY     Family History:  Family History  Problem Relation Age of Onset  . Hyperlipidemia Mother   . Breast cancer Mother 69  . Hypertension Mother   . Heart attack Mother        age 32's  . Hyperlipidemia Father   . Hypertension Father   . Heart attack Father 66  . Breast cancer Sister 43   Social History:  Social History   Substance and Sexual Activity  Alcohol Use Yes  . Alcohol/week: 0.0 standard drinks   Comment: 07/15/2015 "nothing in the last few years"     Social History   Substance and Sexual Activity  Drug Use No    Social History   Socioeconomic History  . Marital status: Significant Other    Spouse name: Not on file  . Number of children: 1  . Years of education: Not on file  . Highest education level: Not on file  Occupational History  . Occupation: disabled  Social  Needs  . Financial resource strain: Not hard at all  . Food insecurity:    Worry: Never true    Inability: Never true  . Transportation needs:    Medical: No    Non-medical: No  Tobacco Use  . Smoking status: Former Smoker    Packs/day: 1.00    Years: 5.00    Pack years: 5.00    Types: Cigarettes  . Smokeless tobacco: Never Used  . Tobacco comment: "quit smoking cigarettes in the early 2000s"  Substance and Sexual Activity  . Alcohol use: Yes    Alcohol/week: 0.0 standard drinks    Comment: 07/15/2015 "nothing in the last few years"  . Drug use: No  . Sexual activity: Not Currently  Lifestyle  . Physical activity:    Days per week: 0 days    Minutes per session: 0 min  . Stress: Not at all  Relationships  . Social connections:    Talks on phone: Once a week    Gets together: Once a week    Attends religious service: Never    Active member of club or organization: No    Attends meetings of clubs or organizations: Never    Relationship status: Living with partner  Other Topics Concern  . Not on file  Social History Narrative   Lives with significant other, has son who comes by daily.   Additional Social History:    Allergies:   Allergies  Allergen Reactions  . Lovastatin Other (See Comments)    Unknown reaction    Labs:  Results for orders placed or performed during the hospital encounter of 10/05/17 (from the past 48 hour(s))  Ammonia     Status: None   Collection Time: 10/07/17  4:59 PM  Result Value Ref Range   Ammonia 20 9 - 35 umol/L    Comment: Performed at [facility], [address] [place], [place] [address]    Current Facility-Administered Medications  Medication Dose Route Frequency Provider Last Rate Last Dose  . acetaminophen (TYLENOL) tablet 650 mg  650 mg Oral Q6H PRN Lance Coon, MD       Or  . acetaminophen (TYLENOL) suppository 650 mg  650 mg Rectal Q6H PRN Lance Coon, MD      . amLODipine (NORVASC) tablet 10 mg  10 mg  Oral Daily Lance Coon, MD   10 mg at 10/08/17 1120  . aspirin EC tablet 81 mg  81 mg Oral Brigid Re, MD   81 mg at 10/08/17 1122  . [START ON 10/09/2017] cephALEXin (KEFLEX) capsule 500 mg  500 mg Oral BID Gladstone Lighter, MD      . Derrill Memo ON 10/09/2017] cholecalciferol (VITAMIN D) tablet 5,000 Units  5,000 Units Oral Daily Gladstone Lighter, MD      . cloNIDine (CATAPRES) tablet 0.1 mg  0.1 mg Oral BID Lance Coon, MD   0.1 mg at 10/08/17 1120  .  donepezil (ARICEPT) tablet 10 mg  10 mg Oral QHS Sudini, Srikar, MD      . enoxaparin (LOVENOX) injection 40 mg  40 mg Subcutaneous Q24H Lance Coon, MD   40 mg at 10/07/17 2155  . gadobenate dimeglumine (MULTIHANCE) injection 14 mL  14 mL Intravenous Once PRN Salary, Montell D, MD      . lamoTRIgine (LAMICTAL) tablet 100 mg  100 mg Oral Corwin Levins, MD   100 mg at 10/07/17 2155  . LORazepam (ATIVAN) injection 0.5 mg  0.5 mg Intravenous Q6H PRN Hillary Bow, MD   0.5 mg at 10/07/17 2156  . LORazepam (ATIVAN) injection 2 mg  2 mg Intravenous Q4H PRN Lance Coon, MD      . ondansetron [facility]) tablet 4 mg  4 mg Oral Q6H PRN Lance Coon, MD       Or  . ondansetron [facility]) injection 4 mg  4 mg Intravenous Q6H PRN Lance Coon, MD      . pantoprazole (PROTONIX) EC tablet 40 mg  40 mg Oral Brigid Re, MD   40 mg at 10/08/17 1122  . propranolol ER (INDERAL LA) 24 hr capsule 60 mg  60 mg Oral Daily Lance Coon, MD   60 mg at 10/08/17 1120  . QUEtiapine (SEROQUEL) tablet 150 mg  150 mg Oral QHS Hillary Bow, MD   150 mg at 10/07/17 2155  . rosuvastatin (CRESTOR) tablet 20 mg  20 mg Oral q1800 Gladstone Lighter, MD      . sertraline (ZOLOFT) tablet 100 mg  100 mg Oral Daily Hillary Bow, MD   100 mg at 10/08/17 1120    Musculoskeletal: Strength & Muscle Tone: within normal limits Gait & Station: normal Patient leans: Backward  Psychiatric Specialty Exam: Physical Exam  Nursing note and vitals  reviewed. Psychiatric: Her affect is blunt. Her speech is delayed. She is slowed, withdrawn and actively hallucinating. Cognition and memory are impaired. She expresses inappropriate judgment.    Review of Systems  Neurological: Positive for seizures.  Psychiatric/Behavioral: Positive for hallucinations.  All other systems reviewed and are negative.   Blood pressure (!) 142/61, pulse 80, temperature 97.7 F (36.5 C), temperature source Oral, resp. rate 18, height 5\' 1"  (1.549 m), weight 69 kg, SpO2 97 %.Body mass index is 28.74 kg/m.  General Appearance: Casual  Eye Contact:  Good  Speech:  Slow  Volume:  Decreased  Mood:  Euthymic  Affect:  Flat  Thought Process:  Linear and Descriptions of Associations: Loose  Orientation:  Other:  person only  Thought Content:  Hallucinations: Visual  Suicidal Thoughts:  No  Homicidal Thoughts:  No  Memory:  Immediate;   Poor Recent;   Poor Remote;   Poor  Judgement:  Poor  Insight:  Lacking  Psychomotor Activity:  Psychomotor Retardation  Concentration:  Concentration: Poor and Attention Span: Poor  Recall:  Poor  Fund of Knowledge:  Poor  Language:  Poor  Akathisia:  No  Handed:  Right  AIMS (if indicated):     Assets:  Desire for Improvement Financial Resources/Insurance Housing Resilience Social Support  ADL's:  Impaired  Cognition:  Impaired,  Moderate  Sleep:        Treatment Plan Summary: Daily contact with patient to assess and evaluate symptoms and progress in treatment and Medication management   PLAN: 1. Please continue current medications. 2. Visual hallucinations are common in Lewy body dementia. If so, patients are exquisitely sensitive EPS from antipsychotics. Low dose Seroquel  is recommended in such patients. I will not increase Seroquel. 3. Please avoid any medications with anticholinergic properties as they worsen cogfnition. 4. Psychiatry will sign off. Please call if problems.  Disposition: No evidence of  imminent risk to self or others at present.   Patient does not meet criteria for psychiatric inpatient admission. Supportive therapy provided about ongoing stressors.  Orson Slick, MD 10/08/2017 8:23 PM

## 2017-10-08 NOTE — Care Management Important Message (Signed)
Copy of signed IM left with patient in room.  

## 2017-10-08 NOTE — Progress Notes (Signed)
Physical Therapy Treatment Patient Details Name: Sandra Charles MRN: [id number] DOB: 14-Aug-1955 Today's Date: 10/08/2017    History of Present Illness 62 y.o. female with a past medical history of anxiety, gastric reflux, hypertension, hyperlipidemia, per record review a history of schizophrenia, CVA, dementia possibly Lewy body dementia, possible seizure disorder on Lamictal who presents to the emergency department after a possible seizure    PT Comments    Patient eating lunch at start of session, agreeable to PT and has no complaints of pain. Patient mobilized to EOB mod I with extended time to maximize independence and cues for hand placement. Patient need minAx1 for immediate standing balance due to posterior lean, able to correct with verbal/tactile cues and use of RW. Ambulated ~161ft with RW and CGA, 1 standing rest break needed. Patient needed verbal cues for AD management, especially to avoid obstacles. In chair to participate in therapeutic exercises with verbal/tactile cues. All needs in reach in chair at end of session with all needs in reach. The patient would benefit from further skilled PT to continue to progress towards goals and maximize safety/mobility.    Follow Up Recommendations  Home health PT;Supervision/Assistance - 24 hour;Supervision for mobility/OOB     Equipment Recommendations  Rolling walker with 5" wheels    Recommendations for Other Services       Precautions / Restrictions Precautions Precautions: Fall Restrictions Weight Bearing Restrictions: No    Mobility  Bed Mobility Overal bed mobility: Modified Independent             General bed mobility comments: Needed extended time this session, verbal cues for hand placement  Transfers Overall transfer level: Needs assistance Equipment used: Rolling walker (2 wheeled) Transfers: Sit to/from Stand Sit to Stand: Min assist;Min guard         General transfer comment: Patient initially stood  with posterior lean minAx1 to maintain standing balance  Ambulation/Gait Ambulation/Gait assistance: Min guard;Min assist Gait Distance (Feet): 120 Feet Assistive device: Rolling walker (2 wheeled)       General Gait Details: Decreased speed, patient leans to L or posterior. Able to correct with verbal/tactile cues. Cues needed for proper RW use.    Stairs             Wheelchair Mobility    Modified Rankin (Stroke Patients Only)       Balance Overall balance assessment: Needs assistance   Sitting balance-Leahy Scale: Poor       Standing balance-Leahy Scale: Poor                              Cognition Arousal/Alertness: Awake/alert Behavior During Therapy: WFL for tasks assessed/performed Overall Cognitive Status: No family/caregiver present to determine baseline cognitive functioning                                 General Comments: Patient with history of dementia per chart review,  no family at bedside to determine baseline cognitive functioning. A&Ox3      Exercises General Exercises - Lower Extremity Long Arc Quad: AROM;Strengthening;Both;20 reps Hip Flexion/Marching: AROM;Both;20 reps Toe Raises: AROM;Both;20 reps Heel Raises: AROM;Both;20 reps    General Comments        Pertinent Vitals/Pain Pain Assessment: No/denies pain    Home Living  Prior Function            PT Goals (current goals can now be found in the care plan section) Acute Rehab PT Goals Patient Stated Goal: Patient would like to return home PT Goal Formulation: With patient Time For Goal Achievement: 10/21/17 Potential to Achieve Goals: Good Progress towards PT goals: Progressing toward goals    Frequency    Min 2X/week      PT Plan Current plan remains appropriate    Co-evaluation              AM-PAC PT "6 Clicks" Daily Activity  Outcome Measure  Difficulty turning over in bed (including adjusting  bedclothes, sheets and blankets)?: None Difficulty moving from lying on back to sitting on the side of the bed? : A Little Difficulty sitting down on and standing up from a chair with arms (e.g., wheelchair, bedside commode, etc,.)?: Unable Help needed moving to and from a bed to chair (including a wheelchair)?: A Little Help needed walking in hospital room?: A Little Help needed climbing 3-5 steps with a railing? : A Lot 6 Click Score: 16    End of Session Equipment Utilized During Treatment: Gait belt Activity Tolerance: Patient tolerated treatment well Patient left: with chair alarm set;in chair;with call bell/phone within reach Nurse Communication: Mobility status PT Visit Diagnosis: Other abnormalities of gait and mobility (R26.89);Difficulty in walking, not elsewhere classified (R26.2);Unsteadiness on feet (R26.81)     Time: 5093-2671 PT Time Calculation (min) (ACUTE ONLY): 23 min  Charges:  $Therapeutic Activity: 23-37 mins                     Lieutenant Diego PT, DPT 1:14 PM,10/08/17 [phone]

## 2017-10-08 NOTE — Progress Notes (Signed)
[facility] NAME: Sandra Charles    MR#:  [id number]  DATE OF BIRTH:  09-05-1955  SUBJECTIVE:  CHIEF COMPLAINT:   Chief Complaint  Patient presents with  . Seizures   -Agitated last night and received Ativan.  Sleepy this morning.  When aroused, she was alert and oriented x2 and pleasant.  Denies any hallucinations now  REVIEW OF SYSTEMS:  Review of Systems  Constitutional: Negative for chills, fever and malaise/fatigue.  HENT: Negative for congestion, ear discharge, hearing loss and nosebleeds.   Eyes: Negative for blurred vision and double vision.  Respiratory: Negative for cough, shortness of breath and wheezing.   Cardiovascular: Negative for chest pain and palpitations.  Gastrointestinal: Negative for abdominal pain, constipation, diarrhea, nausea and vomiting.  Genitourinary: Negative for dysuria.  Musculoskeletal: Negative for myalgias.  Neurological: Negative for dizziness, focal weakness, seizures, weakness and headaches.  Psychiatric/Behavioral: Positive for hallucinations.       Confused    DRUG ALLERGIES:   Allergies  Allergen Reactions  . Lovastatin Other (See Comments)    Unknown reaction    VITALS:  Blood pressure (!) 160/87, pulse 84, temperature 97.9 F (36.6 C), temperature source Oral, resp. rate 15, height 5\' 1"  (1.549 m), weight 69 kg, SpO2 99 %.  PHYSICAL EXAMINATION:  Physical Exam  GENERAL:  62 y.o.-year-old patient lying in the bed with no acute distress.  EYES: Pupils equal, round, reactive to light and accommodation. No scleral icterus. Extraocular muscles intact.  HEENT: Head atraumatic, normocephalic. Oropharynx and nasopharynx clear.  NECK:  Supple, no jugular venous distention. No thyroid enlargement, no tenderness.  LUNGS: Normal breath sounds bilaterally, no wheezing, rales,rhonchi or crepitation. No use of accessory muscles of respiration.  CARDIOVASCULAR: S1, S2 normal. No murmurs,  rubs, or gallops.  ABDOMEN: Soft, nontender, nondistended. Bowel sounds present. No organomegaly or mass.  EXTREMITIES: No pedal edema, cyanosis, or clubbing.  NEUROLOGIC: Cranial nerves II through XII are intact. Muscle strength 5/5 in all extremities. Sensation intact. Gait not checked.  PSYCHIATRIC: The patient is alert and oriented x 2.  SKIN: No obvious rash, lesion, or ulcer.    LABORATORY PANEL:   CBC Recent Labs  Lab 10/06/17 0508  WBC 9.7  HGB 13.5  HCT 40.6  PLT 214   ------------------------------------------------------------------------------------------------------------------  Chemistries  Recent Labs  Lab 10/05/17 1622 10/06/17 0508  NA 138 139  K 4.6 4.1  CL 100 101  CO2 24 25  GLUCOSE 114* 89  BUN 16 20  CREATININE 0.78 0.86  CALCIUM 9.8 9.9  AST 75*  --   ALT 66*  --   ALKPHOS 91  --   BILITOT 1.2  --    ------------------------------------------------------------------------------------------------------------------  Cardiac Enzymes Recent Labs  Lab 10/05/17 1622  TROPONINI <0.03   ------------------------------------------------------------------------------------------------------------------  RADIOLOGY:  Mr Brain Wo Contrast  Result Date: 10/06/2017 CLINICAL DATA:  Focal neuro deficit, greater than 6 hours stroke suspected. Left gaze deviation. Possible seizures. EXAM: MRI HEAD WITHOUT CONTRAST TECHNIQUE: Multiplanar, multiecho pulse sequences of the brain and surrounding structures were obtained without intravenous contrast. COMPARISON:  CT head without contrast 10/05/2017. MRI brain 10/07/2015. FINDINGS: Brain: The diffusion-weighted images demonstrate no acute or subacute infarct. Remote lacunar infarcts are present within the right thalamus. Periventricular white matter changes are mildly advanced for age. There is no significant atrophy. Hemorrhage or mass lesion is present. No significant extraaxial fluid collection is present. White  matter changes extend into the brainstem. Cerebellum  is unremarkable. The internal auditory canals are normal bilaterally. Vascular: Flow is present in the major intracranial arteries. Skull and upper cervical spine: The skull base is within normal limits. Craniocervical junction is normal. Sinuses/Orbits: The paranasal sinuses and mastoid air cells are clear. The globes and orbits are within normal limits bilaterally. IMPRESSION: 1. Acute or focal intracranial abnormality to explain patient's seizures. 2. Remote lacunar infarct of the right thalamus. 3. Periventricular white matter changes are mildly advanced for age. In concert with white matter changes extending into the brainstem, this likely represents premature microvascular ischemia. Electronically Signed   By: San Morelle M.D.   On: 10/06/2017 20:09    EKG:   Orders placed or performed during the hospital encounter of 10/05/17  . EKG 12-Lead  . EKG 12-Lead    ASSESSMENT AND PLAN:   62 year old female with past medical history significant for seizure disorder, schizophrenia, arthritis, hypertension and depression presents to hospital secondary to worsening confusion and lethargy.  1.  Altered mental status-likely metabolic encephalopathy. -Also recently psych meds have been adjusted. -Noted to have urinary tract infection.  Relieved -No new seizures diagnosed.  EEG is negative -Appreciate neurology consult. -MRI showing no acute abnormalities.  Has remote lacunar infarct. -Improving mentation.  Ammonia is within normal limits  2.  Acute cystitis-cultures are growing gram-negative rods. -Continue Rocephin.  WBC is improved  3..  Schizoaffective disorder-with some level of confusion at baseline according to family.  Also prior history of strokes and TIAs causing some vascular dementia changes. -Still not at baseline according to family.   -Psych meds were restarted last night.  Psych consult today for any other  recommendations.  4.  Hypertension-continue clonidine, propranolol and Norvasc  5.  Seizure disorder-no active seizures.  EEG is negative.  Continue Lamictal -Continue outpatient follow-up with neurology  6.  DVT prophylaxis-on Lovenox  Physical therapy recommended home with home health     All the records are reviewed and case discussed with Care Management/Social Workerr. Management plans discussed with the patient, family and they are in agreement.  CODE STATUS: Full Code  TOTAL TIME TAKING CARE OF THIS PATIENT: 39 minutes.   POSSIBLE D/C IN 2 DAYS, DEPENDING ON CLINICAL CONDITION.   Gladstone Lighter M.D on 10/08/2017 at 12:42 PM  Between 7am to 6pm - Pager - [phone]  After 6pm go to [email or web address] - password [email or web address] [facility]  Office  [phone]  CC: Primary care physician; Minda Ditto, MD

## 2017-10-09 LAB — CBC
HEMATOCRIT: 37.4 % (ref 35.0–47.0)
HEMOGLOBIN: 12.7 g/dL (ref 12.0–16.0)
MCH: 27.9 pg (ref 26.0–34.0)
MCHC: 34.1 g/dL (ref 32.0–36.0)
MCV: 82 fL (ref 80.0–100.0)
Platelets: 168 10*3/uL (ref 150–440)
RBC: 4.56 MIL/uL (ref 3.80–5.20)
RDW: 15.7 % — AB (ref 11.5–14.5)
WBC: 6.4 10*3/uL (ref 3.6–11.0)

## 2017-10-09 LAB — BASIC METABOLIC PANEL
ANION GAP: 8 (ref 5–15)
BUN: 21 mg/dL — AB (ref 6–20)
CHLORIDE: 105 mmol/L (ref 98–111)
CO2: 28 mmol/L (ref 22–32)
Calcium: 9 mg/dL (ref 8.9–10.3)
Creatinine, Ser: 0.64 mg/dL (ref 0.44–1.00)
GFR calc Af Amer: >60 mL/min (ref >60)
GLUCOSE: 107 mg/dL — AB (ref 70–99)
Potassium: 3.6 mmol/L (ref 3.5–5.1)
Sodium: 141 mmol/L (ref 135–145)

## 2017-10-09 MED ORDER — CEPHALEXIN 500 MG PO CAPS: 500.0000 mg | ORAL_CAPSULE | Freq: Two times a day (BID) | ORAL | 0 refills | Status: AC

## 2017-10-09 MED ORDER — CEPHALEXIN 500 MG PO CAPS: 500.0000 mg | ORAL_CAPSULE | Freq: Two times a day (BID) | ORAL | 0 refills | Status: DC

## 2017-10-09 NOTE — Discharge Summary (Signed)
[facility] NAME: Sandra Charles    MR#:  [id number]  DATE OF BIRTH:  09-09-1955  DATE OF ADMISSION:  10/05/2017   ADMITTING PHYSICIAN: Lance Coon, MD  DATE OF DISCHARGE:  10/09/17  PRIMARY CARE PHYSICIAN: Minda Ditto, MD   ADMISSION DIAGNOSIS:   Confusion [R41.0] Urinary tract infection without hematuria, site unspecified [N39.0] Altered mental status, unspecified altered mental status type [R41.82]  DISCHARGE DIAGNOSIS:   Principal Problem:   Seizure ([place]) Active Problems:   GERD (gastroesophageal reflux disease)   Essential hypertension   Schizoaffective disorder, bipolar type ([place])   UTI (urinary tract infection)   HLD (hyperlipidemia)   SECONDARY DIAGNOSIS:   Past Medical History:  Diagnosis Date  . Anxiety   . Arthritis    "all over"  . Chronic lower back pain   . Depression   . GERD (gastroesophageal reflux disease)   . Headache    "weekly" (07/15/2015)  . Hyperlipidemia   . Hypertension   . Mini stroke ([place])    "several since 05/2014" (07/15/2015)  . Seizures ([place]) dx'd 04/2015    HOSPITAL COURSE:   62 year old female with past medical history significant for seizure disorder, schizophrenia, arthritis, hypertension and depression presents to hospital secondary to worsening confusion and lethargy.  1.  Altered mental status- metabolic encephalopathy from utI - has underlying schizoaffective disorder- meds were adjusted recently -No new seizures diagnosed.  EEG is negative -Appreciate neurology consult. recommended to continue Lamictal -MRI showing no acute abnormalities.  Has remote lacunar infarct. -Improved mentation and at baseline.  Ammonia is within normal limits  2.  Acute cystitis- klebsiella growing in urine cultures -on Rocephin.  WBC is improved -Discharged on Keflex  3..  Schizoaffective disorder-with some level of confusion at baseline according to family.  Also prior history of  strokes and TIAs  -Also with underlying Lewy body dementia.  Will have hallucinations and behavioral changes.  Seems to be at baseline at this time.  Restarted her Seroquel low-dose and Zoloft.  Continuing outpatient follow-up -Appreciate psychiatry consult  4.  Hypertension-continue clonidine, propranolol and Norvasc  5.  Seizure disorder-no active seizures.  EEG is negative.  Continue Lamictal -Continue outpatient follow-up with neurology  PT recommended home health.  Patient will be discharged today    DISCHARGE CONDITIONS:   Guarded  CONSULTS OBTAINED:   Treatment Team:  Alexis Goodell, MD Clovis Fredrickson, MD Gladstone Lighter, MD  DRUG ALLERGIES:   Allergies  Allergen Reactions  . Lovastatin Other (See Comments)    Unknown reaction   DISCHARGE MEDICATIONS:   Allergies as of 10/09/2017      Reactions   Lovastatin Other (See Comments)   Unknown reaction      Medication List    TAKE these medications   amLODipine 10 MG tablet Commonly known as:  NORVASC Take 10 mg by mouth daily. For high blood pressure   aspirin EC 81 MG tablet Take 81 mg by mouth every morning.   cephALEXin 500 MG capsule Commonly known as:  KEFLEX Take 1 capsule (500 mg total) by mouth 2 (two) times daily for 3 days.   cloNIDine 0.1 MG tablet Commonly known as:  CATAPRES Take 1 tablet (0.1 mg total) by mouth 2 (two) times daily.   donepezil 10 MG tablet Commonly known as:  ARICEPT Take 10 mg by mouth at bedtime.   lamoTRIgine 100 MG tablet Commonly known as:  LAMICTAL Take 100 mg by  mouth at bedtime.   nystatin powder Generic drug:  nystatin Apply topically 2 (two) times daily.   pantoprazole 40 MG tablet Commonly known as:  PROTONIX Take 1 tablet (40 mg total) by mouth daily. What changed:  when to take this   propranolol ER 60 MG 24 hr capsule Commonly known as:  INDERAL LA Take 60 mg by mouth daily.   QUEtiapine 50 MG Tb24 24 hr tablet Commonly known as:   SEROQUEL XR Take 50 mg by mouth at bedtime.   rosuvastatin 20 MG tablet Commonly known as:  CRESTOR Take 20 mg by mouth at bedtime. For high cholesterol   sertraline 100 MG tablet Commonly known as:  ZOLOFT Take 100 mg by mouth daily.   vitamin B-12 1000 MCG tablet Commonly known as:  CYANOCOBALAMIN Take 1,000 mcg by mouth daily.   Vitamin D3 5000 units Tabs Take 5,000 Units by mouth every morning.        DISCHARGE INSTRUCTIONS:   1. PCP f/u in 1-2 weeks 2. Neurology f/u in 1-2 weeks 3. Psychiatry f/u as prior scheduled  DIET:   Cardiac diet  ACTIVITY:   Activity as tolerated  OXYGEN:   Home Oxygen: No.  Oxygen Delivery: room air  DISCHARGE LOCATION:   home   If you experience worsening of your admission symptoms, develop shortness of breath, life threatening emergency, suicidal or homicidal thoughts you must seek medical attention immediately by calling 911 or calling your MD immediately  if symptoms less severe.  You Must read complete instructions/literature along with all the possible adverse reactions/side effects for all the Medicines you take and that have been prescribed to you. Take any new Medicines after you have completely understood and accpet all the possible adverse reactions/side effects.   Please note  You were cared for by a hospitalist during your hospital stay. If you have any questions about your discharge medications or the care you received while you were in the hospital after you are discharged, you can call the unit and asked to speak with the hospitalist on call if the hospitalist that took care of you is not available. Once you are discharged, your primary care physician will handle any further medical issues. Please note that NO REFILLS for any discharge medications will be authorized once you are discharged, as it is imperative that you return to your primary care physician (or establish a relationship with a primary care physician if  you do not have one) for your aftercare needs so that they can reassess your need for medications and monitor your lab values.    On the day of Discharge:  VITAL SIGNS:   Blood pressure 132/69, pulse 78, temperature 97.8 F (36.6 C), temperature source Oral, resp. rate 16, height 5\' 1"  (1.549 m), weight 69 kg, SpO2 100 %.  PHYSICAL EXAMINATION:    GENERAL:  62 y.o.-year-old patient lying in the bed with no acute distress.  EYES: Pupils equal, round, reactive to light and accommodation. No scleral icterus. Extraocular muscles intact.  HEENT: Head atraumatic, normocephalic. Oropharynx and nasopharynx clear.  NECK:  Supple, no jugular venous distention. No thyroid enlargement, no tenderness.  LUNGS: Normal breath sounds bilaterally, no wheezing, rales,rhonchi or crepitation. No use of accessory muscles of respiration.  CARDIOVASCULAR: S1, S2 normal. No murmurs, rubs, or gallops.  ABDOMEN: Soft, nontender, nondistended. Bowel sounds present. No organomegaly or mass.  EXTREMITIES: No pedal edema, cyanosis, or clubbing.  NEUROLOGIC: Cranial nerves II through XII are intact. Muscle strength 5/5  in all extremities. Sensation intact. Gait not checked.  PSYCHIATRIC: The patient is alert and oriented x 2.  No hallucinations.  Pleasant to interact with  -SKIN: No obvious rash, lesion, or ulcer.   DATA REVIEW:   CBC Recent Labs  Lab 10/09/17 0504  WBC 6.4  HGB 12.7  HCT 37.4  PLT 168    Chemistries  Recent Labs  Lab 10/05/17 1622  10/09/17 0504  NA 138   < > 141  K 4.6   < > 3.6  CL 100   < > 105  CO2 24   < > 28  GLUCOSE 114*   < > 107*  BUN 16   < > 21*  CREATININE 0.78   < > 0.64  CALCIUM 9.8   < > 9.0  AST 75*  --   --   ALT 66*  --   --   ALKPHOS 91  --   --   BILITOT 1.2  --   --    < > = values in this interval not displayed.     Microbiology Results  Results for orders placed or performed during the hospital encounter of 10/05/17  Urine culture     Status:  Abnormal   Collection Time: 10/05/17  6:33 PM  Result Value Ref Range Status   Specimen Description   Final    URINE, CATHETERIZED Performed at [facility], [address] [place], [place] [address]    Special Requests   Final    NONE Performed at [facility], [address] [place], [place] [address]    Culture >=100,000 COLONIES/mL KLEBSIELLA PNEUMONIAE (A)  Final   Report Status 10/08/2017 FINAL  Final   Organism ID, Bacteria KLEBSIELLA PNEUMONIAE (A)  Final      Susceptibility   Klebsiella pneumoniae - MIC*    AMPICILLIN RESISTANT Resistant     CEFAZOLIN <=4 SENSITIVE Sensitive     CEFTRIAXONE <=1 SENSITIVE Sensitive     CIPROFLOXACIN <=0.25 SENSITIVE Sensitive     GENTAMICIN <=1 SENSITIVE Sensitive     IMIPENEM <=0.25 SENSITIVE Sensitive     NITROFURANTOIN 64 INTERMEDIATE Intermediate     TRIMETH/SULFA >=320 RESISTANT Resistant     AMPICILLIN/SULBACTAM <=2 SENSITIVE Sensitive     PIP/TAZO <=4 SENSITIVE Sensitive     Extended ESBL NEGATIVE Sensitive     * >=100,000 COLONIES/mL KLEBSIELLA PNEUMONIAE    RADIOLOGY:  No results found.   Management plans discussed with the patient, family and they are in agreement.  CODE STATUS:     Code Status Orders  (From admission, onward)         Start     Ordered   10/05/17 2129  Full code  Continuous     10/05/17 2128        Code Status History    Date Active Date Inactive Code Status Order ID Comments User Context   08/10/2015 1428 08/18/2015 1610 Full Code [id number]  Gonzella Lex, MD Inpatient   07/15/2015 1504 07/19/2015 1638 Full Code [id number]  Albertine Patricia, MD Inpatient   07/12/2015 1644 07/15/2015 1504 Full Code [id number]  Lytle Butte, MD ED   05/14/2015 0255 05/15/2015 1458 Full Code [id number]  Saundra Shelling, MD Inpatient      TOTAL TIME TAKING CARE OF THIS PATIENT: 38 minutes.    Gladstone Lighter M.D on 10/09/2017 at 3:53 PM  Between 7am to 6pm - Pager - [phone]  After  6pm go to [email or web address] -  password [email or web address] [facility]  [facility]  Office  [phone]  CC: Primary care physician; Minda Ditto, MD   Note: This dictation was prepared with Dragon dictation along with smaller phrase technology. Any transcriptional errors that result from this process are unintentional.

## 2017-10-09 NOTE — Care Management Note (Addendum)
Case Management Note  Patient Details  Name: Sandra Charles MRN: [id number] Date of Birth: [birth year]  Subjective/Objective:   Spoke with niece Sandra Charles. She states patient lives with her fiancee'. She is alone part of the day. We discussed home health and her possibilty of having a copay with each discipline. [name] states patient is ambulating just fine and she doesn't know why they are recommending HHPT. She prefers a Marine scientist come out. RNCM discussed the need of nursing and the requirements of a skillable need. [name] stated she would discuss it with her PCP this week further and then decide. I offered to set up both services for an initial assessment but she declined.     No DME needed. Case closed                  Action/Plan:   Expected Discharge Date:  10/09/17               Expected Discharge Plan:  Home/Self Care  In-House Referral:     Discharge planning Services  CM Consult  Post Acute Care Choice:    Choice offered to:     DME Arranged:    DME Agency:     HH Arranged:  Patient Refused [facility] Agency:     Status of Service:  Completed, signed off  If discussed at [employer] of Stay Meetings, dates discussed:    Additional Comments:  Jolly Mango, RN 10/09/2017, 2:48 PM

## 2017-11-18 ENCOUNTER — Other Ambulatory Visit: Payer: Self-pay | Admitting: Family Medicine

## 2017-11-18 DIAGNOSIS — Z1231 Encounter for screening mammogram for malignant neoplasm of breast: Secondary | ICD-10-CM

## 2017-12-02 ENCOUNTER — Ambulatory Visit
Admission: RE | Admit: 2017-12-02 | Discharge: 2017-12-02 | Disposition: A | Discharge status: Home / Self Care | Payer: Medicare HMO | Source: Ambulatory Visit | Attending: Family Medicine | Admitting: Family Medicine

## 2017-12-02 DIAGNOSIS — Z1231 Encounter for screening mammogram for malignant neoplasm of breast: Secondary | ICD-10-CM

## 2018-03-27 ENCOUNTER — Inpatient Hospital Stay
Admission: EM | Admit: 2018-03-27 | Discharge: 2018-03-29 | DRG: 100 | Disposition: A | Discharge status: Home / Self Care | Payer: Medicare HMO | Attending: Internal Medicine | Admitting: Internal Medicine

## 2018-03-27 ENCOUNTER — Other Ambulatory Visit: Payer: Self-pay

## 2018-03-27 ENCOUNTER — Emergency Department: Payer: Medicare HMO

## 2018-03-27 DIAGNOSIS — G9341 Metabolic encephalopathy: Secondary | ICD-10-CM | POA: Diagnosis present

## 2018-03-27 DIAGNOSIS — F259 Schizoaffective disorder, unspecified: Secondary | ICD-10-CM | POA: Diagnosis present

## 2018-03-27 DIAGNOSIS — R569 Unspecified convulsions: Secondary | ICD-10-CM | POA: Diagnosis present

## 2018-03-27 DIAGNOSIS — Z888 Allergy status to other drugs, medicaments and biological substances status: Secondary | ICD-10-CM | POA: Diagnosis not present

## 2018-03-27 DIAGNOSIS — Z79899 Other long term (current) drug therapy: Secondary | ICD-10-CM

## 2018-03-27 DIAGNOSIS — D72829 Elevated white blood cell count, unspecified: Secondary | ICD-10-CM | POA: Diagnosis present

## 2018-03-27 DIAGNOSIS — F0390 Unspecified dementia without behavioral disturbance: Secondary | ICD-10-CM | POA: Diagnosis present

## 2018-03-27 DIAGNOSIS — G8929 Other chronic pain: Secondary | ICD-10-CM | POA: Diagnosis present

## 2018-03-27 DIAGNOSIS — I10 Essential (primary) hypertension: Secondary | ICD-10-CM | POA: Diagnosis present

## 2018-03-27 DIAGNOSIS — Z90710 Acquired absence of both cervix and uterus: Secondary | ICD-10-CM | POA: Diagnosis not present

## 2018-03-27 DIAGNOSIS — Z7982 Long term (current) use of aspirin: Secondary | ICD-10-CM | POA: Diagnosis not present

## 2018-03-27 DIAGNOSIS — Z803 Family history of malignant neoplasm of breast: Secondary | ICD-10-CM

## 2018-03-27 DIAGNOSIS — K219 Gastro-esophageal reflux disease without esophagitis: Secondary | ICD-10-CM | POA: Diagnosis present

## 2018-03-27 DIAGNOSIS — G40409 Other generalized epilepsy and epileptic syndromes, not intractable, without status epilepticus: Principal | ICD-10-CM | POA: Diagnosis present

## 2018-03-27 DIAGNOSIS — Z8349 Family history of other endocrine, nutritional and metabolic diseases: Secondary | ICD-10-CM | POA: Diagnosis not present

## 2018-03-27 DIAGNOSIS — Z8673 Personal history of transient ischemic attack (TIA), and cerebral infarction without residual deficits: Secondary | ICD-10-CM | POA: Diagnosis not present

## 2018-03-27 DIAGNOSIS — E785 Hyperlipidemia, unspecified: Secondary | ICD-10-CM | POA: Diagnosis present

## 2018-03-27 DIAGNOSIS — Z8249 Family history of ischemic heart disease and other diseases of the circulatory system: Secondary | ICD-10-CM

## 2018-03-27 DIAGNOSIS — Z87891 Personal history of nicotine dependence: Secondary | ICD-10-CM

## 2018-03-27 DIAGNOSIS — F329 Major depressive disorder, single episode, unspecified: Secondary | ICD-10-CM | POA: Diagnosis present

## 2018-03-27 DIAGNOSIS — R4182 Altered mental status, unspecified: Secondary | ICD-10-CM | POA: Diagnosis present

## 2018-03-27 LAB — CBC WITH DIFFERENTIAL/PLATELET
Abs Immature Granulocytes: 0.11 10*3/uL — ABNORMAL HIGH (ref 0.00–0.07)
BASOS PCT: 0 %
Basophils Absolute: 0.1 10*3/uL (ref 0.0–0.1)
EOS ABS: 0.1 10*3/uL (ref 0.0–0.5)
Eosinophils Relative: 0 %
HCT: 45.5 % (ref 36.0–46.0)
Hemoglobin: 14.3 g/dL (ref 12.0–15.0)
Immature Granulocytes: 1 %
Lymphocytes Relative: 12 %
Lymphs Abs: 2.2 10*3/uL (ref 0.7–4.0)
MCH: 26.2 pg (ref 26.0–34.0)
MCHC: 31.4 g/dL (ref 30.0–36.0)
MCV: 83.3 fL (ref 80.0–100.0)
MONO ABS: 0.7 10*3/uL (ref 0.1–1.0)
MONOS PCT: 4 %
NEUTROS PCT: 83 %
Neutro Abs: 15.6 10*3/uL — ABNORMAL HIGH (ref 1.7–7.7)
PLATELETS: 244 10*3/uL (ref 150–400)
RBC: 5.46 MIL/uL — ABNORMAL HIGH (ref 3.87–5.11)
RDW: 15.9 % — AB (ref 11.5–15.5)
WBC: 18.7 10*3/uL — ABNORMAL HIGH (ref 4.0–10.5)
nRBC: 0 % (ref 0.0–0.2)

## 2018-03-27 LAB — COMPREHENSIVE METABOLIC PANEL
ALT: 61 U/L — ABNORMAL HIGH (ref 0–44)
ANION GAP: 8 (ref 5–15)
AST: 54 U/L — ABNORMAL HIGH (ref 15–41)
Albumin: 4.7 g/dL (ref 3.5–5.0)
Alkaline Phosphatase: 121 U/L (ref 38–126)
BILIRUBIN TOTAL: 0.7 mg/dL (ref 0.3–1.2)
BUN: 11 mg/dL (ref 6–20)
CO2: 28 mmol/L (ref 22–32)
Calcium: 9.9 mg/dL (ref 8.9–10.3)
Chloride: 103 mmol/L (ref 98–111)
Creatinine, Ser: 0.75 mg/dL (ref 0.44–1.00)
GFR calc Af Amer: >60 mL/min (ref >60)
Glucose, Bld: 126 mg/dL — ABNORMAL HIGH (ref 70–99)
POTASSIUM: 4.3 mmol/L (ref 3.5–5.1)
Sodium: 139 mmol/L (ref 135–145)
TOTAL PROTEIN: 8.8 g/dL — AB (ref 6.5–8.1)

## 2018-03-27 LAB — URINALYSIS, COMPLETE (UACMP) WITH MICROSCOPIC
Bacteria, UA: NONE SEEN
Bilirubin Urine: NEGATIVE
Glucose, UA: NEGATIVE mg/dL
HGB URINE DIPSTICK: NEGATIVE
Ketones, ur: NEGATIVE mg/dL
Leukocytes, UA: NEGATIVE
NITRITE: NEGATIVE
Protein, ur: 30 mg/dL — AB
SPECIFIC GRAVITY, URINE: 1.012 (ref 1.005–1.030)
pH: 7 (ref 5.0–8.0)

## 2018-03-27 LAB — URINE DRUG SCREEN, QUALITATIVE (ARMC ONLY)
Amphetamines, Ur Screen: NOT DETECTED
Barbiturates, Ur Screen: NOT DETECTED
Benzodiazepine, Ur Scrn: NOT DETECTED
Cannabinoid 50 Ng, Ur ~~LOC~~: POSITIVE — AB
Cocaine Metabolite,Ur ~~LOC~~: NOT DETECTED
MDMA (Ecstasy)Ur Screen: NOT DETECTED
Methadone Scn, Ur: NOT DETECTED
Opiate, Ur Screen: NOT DETECTED
Phencyclidine (PCP) Ur S: NOT DETECTED
Tricyclic, Ur Screen: NOT DETECTED

## 2018-03-27 LAB — TSH: TSH: 6.026 u[IU]/mL — ABNORMAL HIGH (ref 0.350–4.500)

## 2018-03-27 LAB — SALICYLATE LEVEL: Salicylate Lvl: <7 mg/dL (ref 2.8–30.0)

## 2018-03-27 LAB — CK: CK TOTAL: 233 U/L (ref 38–234)

## 2018-03-27 LAB — ETHANOL

## 2018-03-27 LAB — ACETAMINOPHEN LEVEL: Acetaminophen (Tylenol), Serum: <10 ug/mL — ABNORMAL LOW (ref 10–30)

## 2018-03-27 LAB — GLUCOSE, CAPILLARY: GLUCOSE-CAPILLARY: 119 mg/dL — AB (ref 70–99)

## 2018-03-27 MED ORDER — LORAZEPAM 2 MG/ML IJ SOLN
INTRAMUSCULAR | Status: AC
  Administered 2018-03-27: 2 mg
  Filled 2018-03-27: qty 1

## 2018-03-27 MED ORDER — ENOXAPARIN SODIUM 40 MG/0.4ML ~~LOC~~ SOLN
40.0000 mg | SUBCUTANEOUS | Status: DC
  Administered 2018-03-27 – 2018-03-28 (×2): 40 mg via SUBCUTANEOUS
  Filled 2018-03-27 (×2): qty 0.4

## 2018-03-27 MED ORDER — ORAL CARE MOUTH RINSE
15.0000 mL | Freq: Two times a day (BID) | OROMUCOSAL | Status: DC
  Administered 2018-03-28: 15 mL via OROMUCOSAL

## 2018-03-27 MED ORDER — ONDANSETRON HCL 4 MG PO TABS: 4.0000 mg | ORAL_TABLET | Freq: Four times a day (QID) | ORAL | Status: DC | PRN

## 2018-03-27 MED ORDER — ASPIRIN EC 81 MG PO TBEC
81.0000 mg | DELAYED_RELEASE_TABLET | ORAL | Status: DC
  Administered 2018-03-28 – 2018-03-29 (×2): 81 mg via ORAL
  Filled 2018-03-27 (×2): qty 1

## 2018-03-27 MED ORDER — LEVETIRACETAM IN NACL 1000 MG/100ML IV SOLN
1000.0000 mg | Freq: Once | INTRAVENOUS | Status: AC
  Administered 2018-03-27: 1000 mg via INTRAVENOUS
  Filled 2018-03-27: qty 100

## 2018-03-27 MED ORDER — SODIUM CHLORIDE 0.9 % IV BOLUS
1000.0000 mL | Freq: Once | INTRAVENOUS | Status: AC
  Administered 2018-03-27: 500 mL via INTRAVENOUS

## 2018-03-27 MED ORDER — SODIUM CHLORIDE 0.9 % IV SOLN
INTRAVENOUS | Status: DC
  Administered 2018-03-27 – 2018-03-28 (×2): via INTRAVENOUS

## 2018-03-27 MED ORDER — AMLODIPINE BESYLATE 10 MG PO TABS
10.0000 mg | ORAL_TABLET | Freq: Every day | ORAL | Status: DC
  Administered 2018-03-28 – 2018-03-29 (×2): 10 mg via ORAL
  Filled 2018-03-27 (×2): qty 1

## 2018-03-27 MED ORDER — VITAMIN D 25 MCG (1000 UNIT) PO TABS
5000.0000 [IU] | ORAL_TABLET | ORAL | Status: DC
  Administered 2018-03-28 – 2018-03-29 (×2): 5000 [IU] via ORAL
  Filled 2018-03-27 (×2): qty 5

## 2018-03-27 MED ORDER — ROSUVASTATIN CALCIUM 20 MG PO TABS
20.0000 mg | ORAL_TABLET | Freq: Every day | ORAL | Status: DC
  Administered 2018-03-28: 20 mg via ORAL
  Filled 2018-03-27: qty 1

## 2018-03-27 MED ORDER — CLONIDINE HCL 0.1 MG PO TABS
0.1000 mg | ORAL_TABLET | Freq: Two times a day (BID) | ORAL | Status: DC
  Administered 2018-03-28 – 2018-03-29 (×3): 0.1 mg via ORAL
  Filled 2018-03-27 (×3): qty 1

## 2018-03-27 MED ORDER — LORAZEPAM 2 MG/ML IJ SOLN
1.0000 mg | Freq: Once | INTRAMUSCULAR | Status: AC
  Administered 2018-03-27: 1 mg via INTRAVENOUS
  Filled 2018-03-27: qty 1

## 2018-03-27 MED ORDER — SERTRALINE HCL 50 MG PO TABS
100.0000 mg | ORAL_TABLET | Freq: Every day | ORAL | Status: DC
  Administered 2018-03-28 – 2018-03-29 (×2): 100 mg via ORAL
  Filled 2018-03-27 (×2): qty 2

## 2018-03-27 MED ORDER — PROPRANOLOL HCL ER 60 MG PO CP24
60.0000 mg | ORAL_CAPSULE | Freq: Every day | ORAL | Status: DC
  Administered 2018-03-28 – 2018-03-29 (×2): 60 mg via ORAL
  Filled 2018-03-27 (×2): qty 1

## 2018-03-27 MED ORDER — LEVETIRACETAM IN NACL 500 MG/100ML IV SOLN
500.0000 mg | Freq: Two times a day (BID) | INTRAVENOUS | Status: DC
  Administered 2018-03-28: 500 mg via INTRAVENOUS
  Filled 2018-03-27 (×2): qty 100

## 2018-03-27 MED ORDER — DONEPEZIL HCL 5 MG PO TABS
10.0000 mg | ORAL_TABLET | Freq: Every day | ORAL | Status: DC
  Administered 2018-03-28: 20:00:00 10 mg via ORAL
  Filled 2018-03-27 (×3): qty 2

## 2018-03-27 MED ORDER — ONDANSETRON HCL 4 MG/2ML IJ SOLN: 4.0000 mg | Freq: Four times a day (QID) | INTRAMUSCULAR | Status: DC | PRN

## 2018-03-27 MED ORDER — LAMOTRIGINE 100 MG PO TABS: 100.0000 mg | ORAL_TABLET | Freq: Every day | ORAL | Status: DC

## 2018-03-27 MED ORDER — PANTOPRAZOLE SODIUM 40 MG PO TBEC
40.0000 mg | DELAYED_RELEASE_TABLET | ORAL | Status: DC
  Administered 2018-03-28 – 2018-03-29 (×2): 40 mg via ORAL
  Filled 2018-03-27 (×2): qty 1

## 2018-03-27 MED ORDER — ACETAMINOPHEN 650 MG RE SUPP: 650.0000 mg | Freq: Four times a day (QID) | RECTAL | Status: DC | PRN

## 2018-03-27 MED ORDER — ACETAMINOPHEN 325 MG PO TABS: 650.0000 mg | ORAL_TABLET | Freq: Four times a day (QID) | ORAL | Status: DC | PRN

## 2018-03-27 MED ORDER — QUETIAPINE FUMARATE ER 50 MG PO TB24
50.0000 mg | ORAL_TABLET | Freq: Every day | ORAL | Status: DC
  Administered 2018-03-28: 50 mg via ORAL
  Filled 2018-03-27 (×3): qty 1

## 2018-03-27 NOTE — ED Notes (Signed)
ED TO INPATIENT HANDOFF REPORT  Name/Age/Gender Sandra Charles 63 y.o. female  Code Status Code Status History    Date Active Date Inactive Code Status Order ID Comments User Context   10/05/2017 2128 10/09/2017 2010 Full Code [id number]  Lance Coon, MD ED   08/10/2015 1428 08/18/2015 1610 Full Code [id number]  Gonzella Lex, MD Inpatient   07/15/2015 1504 07/19/2015 1638 Full Code [id number]  Albertine Patricia, MD Inpatient   07/12/2015 1644 07/15/2015 1504 Full Code [id number]  Lytle Butte, MD ED   05/14/2015 0255 05/15/2015 1458 Full Code [id number]  Saundra Shelling, MD Inpatient      Home/SNF/Other Home  Chief Complaint Seizure  Level of Care/Admitting Diagnosis ED Disposition    ED Disposition Condition [place]: [place] [[id number]]  Level of Care: Med-Surg [16]  Diagnosis: Altered mental status [780.97.ICD-9-CM]  Admitting Physician: Henreitta Leber [[id number]]  Attending Physician: Henreitta Leber [[id number]]  Estimated length of stay: past midnight tomorrow  Certification:: I certify this patient will need inpatient services for at least 2 midnights  PT Class (Do Not Modify): Inpatient [101]  PT Acc Code (Do Not Modify): Private [1]       Medical History Past Medical History:  Diagnosis Date  . Anxiety   . Arthritis    "all over"  . Chronic lower back pain   . Depression   . GERD (gastroesophageal reflux disease)   . Headache    "weekly" (07/15/2015)  . Hyperlipidemia   . Hypertension   . Mini stroke ([place])    "several since 05/2014" (07/15/2015)  . Seizures ([place]) dx'd 04/2015    Allergies Allergies  Allergen Reactions  . Lovastatin Other (See Comments)    Unknown reaction    IV Location/Drains/Wounds Patient Lines/Drains/Airways Status   Active Line/Drains/Airways    Name:   Placement date:   Placement time:   Site:   Days:   Peripheral IV 10/05/17 Right Wrist   10/05/17    1622    Wrist   173   Peripheral IV  03/27/18 Right Hand   03/27/18    1847    Hand   less than 1   Peripheral IV 03/27/18 Left Hand   03/27/18    1847    Hand   less than 1          Labs/Imaging Results for orders placed or performed during the hospital encounter of 03/27/18 (from the past 48 hour(s))  Glucose, capillary     Status: Abnormal   Collection Time: 03/27/18  6:21 PM  Result Value Ref Range   Glucose-Capillary 119 (H) 70 - 99 mg/dL  CBC with Differential/Platelet     Status: Abnormal   Collection Time: 03/27/18  6:33 PM  Result Value Ref Range   WBC 18.7 (H) 4.0 - 10.5 K/uL   RBC 5.46 (H) 3.87 - 5.11 MIL/uL   Hemoglobin 14.3 12.0 - 15.0 g/dL   HCT 45.5 36.0 - 46.0 %   MCV 83.3 80.0 - 100.0 fL   MCH 26.2 26.0 - 34.0 pg   MCHC 31.4 30.0 - 36.0 g/dL   RDW 15.9 (H) 11.5 - 15.5 %   Platelets 244 150 - 400 K/uL   nRBC 0.0 0.0 - 0.2 %   Neutrophils Relative % 83 %   Neutro Abs 15.6 (H) 1.7 - 7.7 K/uL   Lymphocytes Relative 12 %   Lymphs Abs 2.2 0.7 - 4.0 K/uL  Monocytes Relative 4 %   Monocytes Absolute 0.7 0.1 - 1.0 K/uL   Eosinophils Relative 0 %   Eosinophils Absolute 0.1 0.0 - 0.5 K/uL   Basophils Relative 0 %   Basophils Absolute 0.1 0.0 - 0.1 K/uL   Immature Granulocytes 1 %   Abs Immature Granulocytes 0.11 (H) 0.00 - 0.07 K/uL    Comment: Performed at [facility], [address] [place], [place] [address]  Salicylate level     Status: None   Collection Time: 03/27/18  6:33 PM  Result Value Ref Range   Salicylate Lvl <2.4 2.8 - 30.0 mg/dL    Comment: Performed at [facility], [address] [place], [place] [address]  Comprehensive metabolic panel     Status: Abnormal   Collection Time: 03/27/18  6:33 PM  Result Value Ref Range   Sodium 139 135 - 145 mmol/L   Potassium 4.3 3.5 - 5.1 mmol/L   Chloride 103 98 - 111 mmol/L   CO2 28 22 - 32 mmol/L   Glucose, Bld 126 (H) 70 - 99 mg/dL   BUN 11 6 - 20 mg/dL   Creatinine, Ser 0.75 0.44 - 1.00 mg/dL   Calcium 9.9 8.9 -  10.3 mg/dL   Total Protein 8.8 (H) 6.5 - 8.1 g/dL   Albumin 4.7 3.5 - 5.0 g/dL   AST 54 (H) 15 - 41 U/L   ALT 61 (H) 0 - 44 U/L   Alkaline Phosphatase 121 38 - 126 U/L   Total Bilirubin 0.7 0.3 - 1.2 mg/dL   GFR calc non Af Amer >60 >60 mL/min   GFR calc Af Amer >60 >60 mL/min   Anion gap 8 5 - 15    Comment: Performed at [facility], [address] [place], [place] [address]  Ethanol     Status: None   Collection Time: 03/27/18  6:33 PM  Result Value Ref Range   Alcohol, Ethyl (B) <10 <10 mg/dL    Comment: (NOTE) Lowest detectable limit for serum alcohol is 10 mg/dL. For medical purposes only. Performed at [facility], [address] [place], [place] [address]   Acetaminophen level     Status: Abnormal   Collection Time: 03/27/18  6:33 PM  Result Value Ref Range   Acetaminophen (Tylenol), Serum <10 (L) 10 - 30 ug/mL    Comment: (NOTE) Therapeutic concentrations vary significantly. A range of 10-30 ug/mL  may be an effective concentration for many patients. However, some  are best treated at concentrations outside of this range. Acetaminophen concentrations >150 ug/mL at 4 hours after ingestion  and >50 ug/mL at 12 hours after ingestion are often associated with  toxic reactions. Performed at [facility], [address] [place], [place] [address]   CK     Status: None   Collection Time: 03/27/18  6:33 PM  Result Value Ref Range   Total CK 233 38 - 234 U/L    Comment: Performed at [facility], [address] [place], [place] [address]  TSH     Status: Abnormal   Collection Time: 03/27/18  6:33 PM  Result Value Ref Range   TSH 6.026 (H) 0.350 - 4.500 uIU/mL    Comment: Performed by a 3rd Generation assay with a functional sensitivity of <=0.01 uIU/mL. Performed at [facility], [place],  [address]    Ct Head Wo Contrast  Result Date: 03/27/2018 CLINICAL DATA:  63 year old female with  altered mental status following seizure. EXAM: CT HEAD WITHOUT CONTRAST TECHNIQUE: Contiguous axial images were obtained from the base of the skull through the vertex without intravenous contrast. COMPARISON:  10/06/2017 MR, 10/05/2017 CT and prior studies FINDINGS: Brain: No evidence of acute infarction, hemorrhage, hydrocephalus, extra-axial collection or mass lesion/mass effect. Mild chronic small-vessel white matter ischemic changes and remote RIGHT thalamic infarct again noted. Vascular: Carotid atherosclerotic calcifications again noted. Skull: Normal. Negative for fracture or focal lesion. Sinuses/Orbits: No acute finding. Other: None. IMPRESSION: 1. No evidence of acute intracranial abnormality 2. Mild chronic small-vessel white matter ischemic changes and remote RIGHT thalamic infarct. Electronically Signed   By: Margarette Canada M.D.   On: 03/27/2018 19:37    Pending Labs Unresulted Labs (From admission, onward)    Start     Ordered   03/27/18 1823  Urine Drug Screen, Qualitative  Once,   STAT     03/27/18 1822   03/27/18 1822  Lamotrigine level  Once,   STAT     03/27/18 1822   03/27/18 1822  CBC with Differential  Once,   STAT     03/27/18 1822   03/27/18 1822  Urinalysis, Complete w Microscopic  ONCE - STAT,   STAT     03/27/18 1822   Signed and Held  Basic metabolic panel  Tomorrow morning,   R     Signed and Held   Signed and Held  CBC  Tomorrow morning,   R     Signed and Held   Signed and Held  CBC  (enoxaparin (LOVENOX)    CrCl >/= 30 ml/min)  Once,   R    Comments:  Baseline for enoxaparin therapy IF NOT ALREADY DRAWN.  Notify MD if PLT < 100 K.    Signed and Held   Signed and Held  Creatinine, serum  (enoxaparin (LOVENOX)    CrCl >/= 30 ml/min)  Once,   R    Comments:  Baseline for enoxaparin therapy IF NOT ALREADY DRAWN.    Signed and Held   Signed and Held  Creatinine, serum  (enoxaparin (LOVENOX)    CrCl >/= 30 ml/min)  Weekly,   R    Comments:  while on enoxaparin  therapy    Signed and Held          Vitals/Pain Today's Vitals   03/27/18 1830 03/27/18 1845 03/27/18 1900 03/27/18 2049  BP: (!) 141/79 97/66 105/71 (!) 144/69  Pulse: 96 93 98 85  Resp: (!) 22 (!) 21 (!) 25 19  Temp:      TempSrc:      SpO2: 99% 100% 100% 97%    Isolation Precautions No active isolations  Medications Medications  LORazepam (ATIVAN) 2 MG/ML injection (2 mg  Given 03/27/18 1832)  sodium chloride 0.9 % bolus 1,000 mL (0 mLs Intravenous Stopped 03/27/18 1920)  LORazepam (ATIVAN) injection 1 mg (1 mg Intravenous Given 03/27/18 1837)  levETIRAcetam (KEPPRA) IVPB 1000 mg/100 mL premix (0 mg Intravenous Stopped 03/27/18 1919)    Mobility non-ambulatory

## 2018-03-27 NOTE — ED Notes (Signed)
Family at bedside. Patient continues to grab for things , not responding to verbal commands. sz precautuions in progress.

## 2018-03-27 NOTE — ED Provider Notes (Addendum)
[facility] Emergency Department Provider Note  ____________________________________________   I have reviewed the triage vital signs and the nursing notes. Where available I have reviewed prior notes and, if possible and indicated, outside hospital notes.    HISTORY  Chief Complaint Seizures    HPI Sandra Charles is a 62 y.o. female history of seizure disorders on Lamictal apparently unknown if she is compliant presents today with a seizure.  She had 1 witnessed seizure according to EMS prior to their arrival and just as a pulled up she was starting to talk to them again and had another witnessed grand mal seizure as they pulled into the room.  Patient cannot give a history.  No one else was present to give a history.  Blood sugar was normal for EMS.  IV was established and no other history is available level 5 chart caveat; no further history available due to patient status.    Past Medical History:  Diagnosis Date  . Anxiety   . Arthritis    "all over"  . Chronic lower back pain   . Depression   . GERD (gastroesophageal reflux disease)   . Headache    "weekly" (07/15/2015)  . Hyperlipidemia   . Hypertension   . Mini stroke ([place])    "several since 05/2014" (07/15/2015)  . Seizures ([place]) dx'd 04/2015    Patient Active Problem List   Diagnosis Date Noted  . HLD (hyperlipidemia) 10/05/2017  . Seizure ([place])   . UTI (urinary tract infection) 08/17/2015  . Seizure disorder ([place]) 08/10/2015  . Cannabis use disorder, moderate, dependence ([place]) 08/10/2015  . Schizoaffective disorder, bipolar type ([place]) 08/10/2015  . Abnormal finding on MRI of brain   . Hypokalemia 07/15/2015  . Confusion   . GERD (gastroesophageal reflux disease) 11/25/2014  . Essential hypertension 11/25/2014    Past Surgical History:  Procedure Laterality Date  . DILATION AND CURETTAGE OF UTERUS    . LUMBAR PUNCTURE  07/14/2015  . TUBAL LIGATION    . VAGINAL HYSTERECTOMY       Prior to Admission medications   Medication Sig Start Date End Date Taking? Authorizing Provider  amLODipine (NORVASC) 10 MG tablet Take 10 mg by mouth daily. For high blood pressure    [provider]  aspirin EC 81 MG tablet Take 81 mg by mouth every morning.     [provider]  Cholecalciferol (VITAMIN D3) 5000 UNITS TABS Take 5,000 Units by mouth every morning.     [provider]  cloNIDine (CATAPRES) 0.1 MG tablet Take 1 tablet (0.1 mg total) by mouth 2 (two) times daily. 07/15/15   Loletha Grayer, MD  donepezil (ARICEPT) 10 MG tablet Take 10 mg by mouth at bedtime.    [provider]  lamoTRIgine (LAMICTAL) 100 MG tablet Take 100 mg by mouth at bedtime.    Josephina Gip, MD  nystatin (NYSTATIN) powder Apply topically 2 (two) times daily.    [provider]  pantoprazole (PROTONIX) 40 MG tablet Take 1 tablet (40 mg total) by mouth daily. Patient taking differently: Take 40 mg by mouth every morning.  11/25/14   Wellington Hampshire, MD  propranolol ER (INDERAL LA) 60 MG 24 hr capsule Take 60 mg by mouth daily. 09/15/17   [provider]  QUEtiapine (SEROQUEL XR) 50 MG TB24 24 hr tablet Take 50 mg by mouth at bedtime.    [provider]  rosuvastatin (CRESTOR) 20 MG tablet Take 20 mg by mouth  at bedtime. For high cholesterol    [provider]  sertraline (ZOLOFT) 100 MG tablet Take 100 mg by mouth daily. 08/30/17   [provider]  vitamin B-12 (CYANOCOBALAMIN) 1000 MCG tablet Take 1,000 mcg by mouth daily.    [provider]    Allergies Lovastatin  Family History  Problem Relation Age of Onset  . Hyperlipidemia Mother   . Breast cancer Mother 27  . Hypertension Mother   . Heart attack Mother        age 84's  . Hyperlipidemia Father   . Hypertension Father   . Heart attack Father 87  . Breast cancer Sister 17    Social History Social History   Tobacco Use  . Smoking status: Former  Smoker    Packs/day: 1.00    Years: 5.00    Pack years: 5.00    Types: Cigarettes  . Smokeless tobacco: Never Used  . Tobacco comment: "quit smoking cigarettes in the early 2000s"  Substance Use Topics  . Alcohol use: Yes    Alcohol/week: 0.0 standard drinks    Comment: 07/15/2015 "nothing in the last few years"  . Drug use: No    Review of Systems Level 5 chart caveat; no further history available due to patient status.   ____________________________________________   PHYSICAL EXAM:  VITAL SIGNS: ED Triage Vitals [03/27/18 1829]  Enc Vitals Group     BP (!) 142/90     Pulse Rate 99     Resp 20     Temp 98.8 F (37.1 C)     Temp Source Axillary     SpO2 99 %     Weight      Height      Head Circumference      Peak Flow      Pain Score      Pain Loc      Pain Edu?      Excl. in [place]?     Constitutional: Patient appears to be seizing, having diffuse twitching activity and moving to the right Eyes: Conjunctivae are normal Head: Atraumatic HEENT: No congestion/rhinnorhea. Mucous membranes are moist.  Oropharynx non-erythematous Neck:   Nontender with no meningismus, no masses, no stridor Cardiovascular: Normal rate, regular rhythm. Grossly normal heart sounds.  Good peripheral circulation. Respiratory: Normal respiratory effort.  No retractions. Lungs CTAB. Abdominal: Soft and nontender. No distention. No guarding no rebound Back:  There is no focal tenderness or step off.  there is no midline tenderness there are no lesions noted. there is no CVA tenderness Musculoskeletal: No lower extremity tenderness, no upper extremity tenderness. No joint effusions, no DVT signs strong distal pulses no edema Neurologic:  Normal speech and language. No gross focal neurologic deficits are appreciated.  Skin:  Skin is warm, dry and intact. No rash noted. Psychiatric: Mood and affect are normal. Speech and behavior are normal.  ____________________________________________    LABS (all labs ordered are listed, but only abnormal results are displayed)  Labs Reviewed  GLUCOSE, CAPILLARY - Abnormal; Notable for the following components:      Result Value   Glucose-Capillary 119 (*)    All other components within normal limits  CBC WITH DIFFERENTIAL/PLATELET - Abnormal; Notable for the following components:   WBC 18.7 (*)    RBC 5.46 (*)    RDW 15.9 (*)    Neutro Abs 15.6 (*)    Abs Immature Granulocytes 0.11 (*)    All other components within normal limits  LAMOTRIGINE LEVEL  SALICYLATE LEVEL  COMPREHENSIVE METABOLIC PANEL  CBC WITH DIFFERENTIAL/PLATELET  ETHANOL  ACETAMINOPHEN LEVEL  URINALYSIS, COMPLETE (UACMP) WITH MICROSCOPIC  CK  URINE DRUG SCREEN, QUALITATIVE ([facility] ONLY)  TSH  CBG MONITORING, ED    Pertinent labs  results that were available during my care of the patient were reviewed by me and considered in my medical decision making (see chart for details). ____________________________________________  EKG  I personally interpreted any EKGs ordered by me or triage  Sinus rhythm rate 95 bpm no acute ST elevation or depression normal axis.  QT 344 ______________________________________  RADIOLOGY  Pertinent labs & imaging results that were available during my care of the patient were reviewed by me and considered in my medical decision making (see chart for details). If possible, patient and/or family made aware of any abnormal findings.  No results found. ____________________________________________    PROCEDURES  Procedure(s) performed: None  Procedures  Critical Care performed: CRITICAL CARE Performed by: Schuyler Amor   Total critical care time: 48 minutes  Critical care time was exclusive of separately billable procedures and treating other patients.  Critical care was necessary to treat or prevent imminent or life-threatening deterioration.  Critical care was time spent personally by me on the following  activities: development of treatment plan with patient and/or surrogate as well as nursing, discussions with consultants, evaluation of patient's response to treatment, examination of patient, obtaining history from patient or surrogate, ordering and performing treatments and interventions, ordering and review of laboratory studies, ordering and review of radiographic studies, pulse oximetry and re-evaluation of patient's condition.   ____________________________________________   INITIAL IMPRESSION / ASSESSMENT AND PLAN / ED COURSE  Pertinent labs & imaging results that were available during my care of the patient were reviewed by me and considered in my medical decision making (see chart for details).   Patient here apparently had a grand mal seizure, diffuse twitching and some right gaze and head turning.  No documented trauma known history of prior seizure.  I immediately established IV with the help of excellent nursing and gave to of Ativan.  The diffuse seizure activity seem to stop, patient does appear to be postictal however there was some roving eye motion is unclear if this constitutes ongoing seizure or just a postictal phase.  ----------------------------------------- 7:07 PM on 03/27/2018 -----------------------------------------    She does seem to be moving everything, and she will localize to pain.  Nonetheless I did give her another dose of Ativan and I loaded her with Keppra.  Hard to tell if there is still seizure activity or if this is just a complicated postictal.  Patient has a history of schizophrenia as well.  Family are I am told now arriving we will see if we can get more history from them.  ----------------------------------------- 7:13 PM on 03/27/2018 -----------------------------------------  States she has had seizures for years and is compliant with her meds does not take alcohol had 2 seizures that were witnessed this morning with no trauma, did come back to  baseline in between them, last seizure was around 1030 this morning.  Is been over a month since her last seizure before this.  Was doing okay and then had another seizure this evening and that time they called EMS.  At this time, patient is awake and looking around a little bit she still somewhat twitchy, her husband states that she twitches like this "all the time" and this is exactly how she appears in the postictal  period, given multiple seizures on arrival, and uncertain history of trauma I did order a CT scan we will see if we can get that.  Husband states that she is compliant with her medications.  We will see how she does in this.  I does appear that she is coming out of the slowly, we will continue to observe her seizure precautions are in place and she likely will need to be admitted for recurrent seizures  ----------------------------------------- 7:56 PM on 03/27/2018 -----------------------------------------  Patient now resting comfortably in the bed, much less agitated, pulling the blanket over her make purposeful movements awake, she is tracking well, I think she is coming out of this pretty well at this time.  We will continue to observe her and I will admit.     ____________________________________________   FINAL CLINICAL IMPRESSION(S) / ED DIAGNOSES  Final diagnoses:  None      This chart was dictated using voice recognition software.  Despite best efforts to proofread,  errors can occur which can change meaning.       Schuyler Amor, MD 03/27/18 Einar Crow    Schuyler Amor, MD 03/27/18 1914    Schuyler Amor, MD 03/27/18 Docia Chuck    Schuyler Amor, MD 03/27/18 [phone]

## 2018-03-27 NOTE — ED Triage Notes (Signed)
Arrives via EMS from home with c/o of witnessed sz at home. Patient arrives having sz activity and incontinent of bladder.

## 2018-03-27 NOTE — ED Notes (Signed)
keppra bolus infusing

## 2018-03-27 NOTE — H&P (Signed)
[facility] [place] NAME: Sandra Charles    MR#:  [id number]  DATE OF BIRTH:  01/23/1956  DATE OF ADMISSION:  03/27/2018  PRIMARY CARE PHYSICIAN: Minda Ditto, MD   REQUESTING/REFERRING PHYSICIAN: Dr. Charlotte Crumb  CHIEF COMPLAINT:   Chief Complaint  Patient presents with  . Seizures    HISTORY OF PRESENT ILLNESS:  Sandra Charles  is a 63 y.o. female with a known history of seizures, schizoaffective disorder, dementia, history of previous CVA, hypertension hyperlipidemia GERD, depression, anxiety who presents to the hospital due to altered mental status/seizures.  Patient herself is somewhat encephalopathic and therefore most of the history obtained from the husband/boyfriend at bedside.  As per the husband/boyfriend patient had a seizure earlier today and has been somewhat altered since then.  He describes her seizures like she starts to shake including her legs and arms and she starts to talk and does not make any sense.  She had 2 of those episodes today and therefore came to the ER for further evaluation.  In the ER she had seizure and therefore was loaded with IV Keppra and given some Ativan.  She is now encephalopathic but responds to painful stimuli.  Hospitalist services were contacted for admission.  PAST MEDICAL HISTORY:   Past Medical History:  Diagnosis Date  . Anxiety   . Arthritis    "all over"  . Chronic lower back pain   . Depression   . GERD (gastroesophageal reflux disease)   . Headache    "weekly" (07/15/2015)  . Hyperlipidemia   . Hypertension   . Mini stroke ([place])    "several since 05/2014" (07/15/2015)  . Seizures ([place]) dx'd 04/2015    PAST SURGICAL HISTORY:   Past Surgical History:  Procedure Laterality Date  . DILATION AND CURETTAGE OF UTERUS    . LUMBAR PUNCTURE  07/14/2015  . TUBAL LIGATION    . VAGINAL HYSTERECTOMY      SOCIAL HISTORY:   Social History   Tobacco Use  . Smoking status: Former Smoker      Packs/day: 1.00    Years: 5.00    Pack years: 5.00    Types: Cigarettes  . Smokeless tobacco: Never Used  . Tobacco comment: "quit smoking cigarettes in the early 2000s"  Substance Use Topics  . Alcohol use: Yes    Alcohol/week: 0.0 standard drinks    Comment: 07/15/2015 "nothing in the last few years"    FAMILY HISTORY:   Family History  Problem Relation Age of Onset  . Hyperlipidemia Mother   . Breast cancer Mother 37  . Hypertension Mother   . Heart attack Mother        age 33's  . Hyperlipidemia Father   . Hypertension Father   . Heart attack Father 84  . Breast cancer Sister 25    DRUG ALLERGIES:   Allergies  Allergen Reactions  . Lovastatin Other (See Comments)    Unknown reaction    REVIEW OF SYSTEMS:   Review of Systems  Unable to perform ROS: Dementia    MEDICATIONS AT HOME:   Prior to Admission medications   Medication Sig Start Date End Date Taking? Authorizing Provider  amLODipine (NORVASC) 10 MG tablet Take 10 mg by mouth daily. For high blood pressure   Yes [provider]  aspirin EC 81 MG tablet Take 81 mg by mouth every morning.    Yes [provider]  Cholecalciferol (VITAMIN D3) 5000  UNITS TABS Take 5,000 Units by mouth every morning.    Yes [provider]  cloNIDine (CATAPRES) 0.1 MG tablet Take 1 tablet (0.1 mg total) by mouth 2 (two) times daily. 07/15/15  Yes Wieting, Richard, MD  donepezil (ARICEPT) 10 MG tablet Take 10 mg by mouth at bedtime.   Yes [provider]  lamoTRIgine (LAMICTAL) 100 MG tablet Take 100 mg by mouth at bedtime.   Yes Josephina Gip, MD  nystatin (NYSTATIN) powder Apply topically 2 (two) times daily.   Yes [provider]  pantoprazole (PROTONIX) 40 MG tablet Take 1 tablet (40 mg total) by mouth daily. Patient taking differently: Take 40 mg by mouth every morning.  11/25/14  Yes Wellington Hampshire, MD  propranolol ER (INDERAL LA) 60 MG 24 hr capsule Take 60 mg by mouth  daily. 09/15/17  Yes [provider]  QUEtiapine (SEROQUEL XR) 50 MG TB24 24 hr tablet Take 50 mg by mouth at bedtime.   Yes [provider]  rosuvastatin (CRESTOR) 20 MG tablet Take 20 mg by mouth at bedtime. For high cholesterol   Yes [provider]  sertraline (ZOLOFT) 100 MG tablet Take 100 mg by mouth daily. 08/30/17  Yes [provider]  vitamin B-12 (CYANOCOBALAMIN) 1000 MCG tablet Take 1,000 mcg by mouth daily.   Yes [provider]      VITAL SIGNS:  Blood pressure 105/71, pulse 98, temperature 98.8 F (37.1 C), temperature source Axillary, resp. rate (!) 25, SpO2 100 %.  PHYSICAL EXAMINATION:  Physical Exam  GENERAL:  63 y.o.-year-old patient lying in the bed fidgety and lethargic/encephalopathic. EYES: Pupils equal, round, reactive to light. No scleral icterus. Extraocular muscles intact.  HEENT: Head atraumatic, normocephalic. Oropharynx and nasopharynx clear. No oropharyngeal erythema, moist oral mucosa  NECK:  Supple, no jugular venous distention. No thyroid enlargement, no tenderness.  LUNGS: Normal breath sounds bilaterally, no wheezing, rales, rhonchi. No use of accessory muscles of respiration.  CARDIOVASCULAR: S1, S2 RRR. No murmurs, rubs, gallops, clicks.  ABDOMEN: Soft, nontender, nondistended. Bowel sounds present. No organomegaly or mass.  EXTREMITIES: No pedal edema, cyanosis, or clubbing. + 2 pedal & radial pulses b/l.   NEUROLOGIC: Cranial nerves II through XII are intact. No focal Motor or sensory deficits appreciated b/l. Globally weak.   PSYCHIATRIC: The patient is alert and oriented x 1. Encephalopathic.  SKIN: No obvious rash, lesion, or ulcer.   LABORATORY PANEL:   CBC Recent Labs  Lab 03/27/18 1833  WBC 18.7*  HGB 14.3  HCT 45.5  PLT 244   ------------------------------------------------------------------------------------------------------------------  Chemistries  Recent Labs  Lab 03/27/18 1833    NA 139  K 4.3  CL 103  CO2 28  GLUCOSE 126*  BUN 11  CREATININE 0.75  CALCIUM 9.9  AST 54*  ALT 61*  ALKPHOS 121  BILITOT 0.7   ------------------------------------------------------------------------------------------------------------------  Cardiac Enzymes No results for input(s): TROPONINI in the last 168 hours. ------------------------------------------------------------------------------------------------------------------  RADIOLOGY:  Ct Head Wo Contrast  Result Date: 03/27/2018 CLINICAL DATA:  63 year old female with altered mental status following seizure. EXAM: CT HEAD WITHOUT CONTRAST TECHNIQUE: Contiguous axial images were obtained from the base of the skull through the vertex without intravenous contrast. COMPARISON:  10/06/2017 MR, 10/05/2017 CT and prior studies FINDINGS: Brain: No evidence of acute infarction, hemorrhage, hydrocephalus, extra-axial collection or mass lesion/mass effect. Mild chronic small-vessel white matter ischemic changes and remote RIGHT thalamic infarct again noted. Vascular: Carotid atherosclerotic calcifications again noted. Skull: Normal. Negative for fracture  or focal lesion. Sinuses/Orbits: No acute finding. Other: None. IMPRESSION: 1. No evidence of acute intracranial abnormality 2. Mild chronic small-vessel white matter ischemic changes and remote RIGHT thalamic infarct. Electronically Signed   By: Margarette Canada M.D.   On: 03/27/2018 19:37     IMPRESSION AND PLAN:   63 year old female with past medical history of dementia, schizoaffective disorder, seizures, GERD, depression, history of previous CVA, hypertension, hyperlipidemia who presents to the hospital due to altered mental status/seizures.  1.  Altered mental status-likely metabolic encephalopathy secondary to seizures with underlying dementia. - Patient is now sedated and encephalopathic secondary to receiving Ativan and also postictal. - Continue treat underlying seizures, follow  mental status.  CT head is negative for acute pathology.  Await urinalysis to rule out a UTI.  2.  Seizures- patient has history of seizures and is on Lamictal.  As per the patient's family she is compliant.  She was noted to have recurrent seizures today.  CT head is negative as mentioned. - She has been loaded with IV Keppra in the ER with 1 g, I will continue 500 mg twice daily of Keppra for now.  Get a neurology consult in the morning.  Continue seizure precautions.  3.  Essential hypertension-continue propranolol, clonidine, Norvasc.  4.  Hyperlipidemia-continue Crestor.  5.  History of schizoaffective disorder/depression- continue Seroquel, Zoloft.  6.  Dementia-continue Aricept.    All the records are reviewed and case discussed with ED provider. Management plans discussed with the patient, family and they are in agreement.  CODE STATUS: Full code  TOTAL TIME TAKING CARE OF THIS PATIENT: 40 minutes.    Henreitta Leber M.D on 03/27/2018 at 8:25 PM  Between 7am to 6pm - Pager - [phone]  After 6pm go to [email or web address] - password [email or web address] [facility]  Office  [phone]  CC: Primary care physician; Minda Ditto, MD

## 2018-03-27 NOTE — ED Notes (Signed)
Rachel RN, aware of bed assigned  

## 2018-03-27 NOTE — ED Notes (Signed)
Patient off unit to CT head  

## 2018-03-28 ENCOUNTER — Inpatient Hospital Stay: Payer: Medicare HMO

## 2018-03-28 DIAGNOSIS — R569 Unspecified convulsions: Secondary | ICD-10-CM

## 2018-03-28 DIAGNOSIS — R4182 Altered mental status, unspecified: Secondary | ICD-10-CM

## 2018-03-28 LAB — CBC
HCT: 38 % (ref 36.0–46.0)
Hemoglobin: 12.2 g/dL (ref 12.0–15.0)
MCH: 26.2 pg (ref 26.0–34.0)
MCHC: 32.1 g/dL (ref 30.0–36.0)
MCV: 81.7 fL (ref 80.0–100.0)
PLATELETS: 196 10*3/uL (ref 150–400)
RBC: 4.65 MIL/uL (ref 3.87–5.11)
RDW: 15.8 % — ABNORMAL HIGH (ref 11.5–15.5)
WBC: 11.8 10*3/uL — ABNORMAL HIGH (ref 4.0–10.5)
nRBC: 0 % (ref 0.0–0.2)

## 2018-03-28 LAB — BASIC METABOLIC PANEL
Anion gap: 7 (ref 5–15)
BUN: 10 mg/dL (ref 6–20)
CO2: 27 mmol/L (ref 22–32)
Calcium: 9.2 mg/dL (ref 8.9–10.3)
Chloride: 104 mmol/L (ref 98–111)
Creatinine, Ser: 0.67 mg/dL (ref 0.44–1.00)
GFR calc Af Amer: >60 mL/min (ref >60)
GFR calc non Af Amer: >60 mL/min (ref >60)
Glucose, Bld: 100 mg/dL — ABNORMAL HIGH (ref 70–99)
Potassium: 4.1 mmol/L (ref 3.5–5.1)
Sodium: 138 mmol/L (ref 135–145)

## 2018-03-28 MED ORDER — LAMOTRIGINE 25 MG PO TABS
75.0000 mg | ORAL_TABLET | Freq: Two times a day (BID) | ORAL | Status: DC
  Administered 2018-03-28 – 2018-03-29 (×2): 75 mg via ORAL
  Filled 2018-03-28 (×2): qty 3

## 2018-03-28 MED ORDER — LORAZEPAM 2 MG/ML IJ SOLN
2.0000 mg | Freq: Once | INTRAMUSCULAR | Status: AC
  Administered 2018-03-28: 2 mg via INTRAVENOUS
  Filled 2018-03-28: qty 1

## 2018-03-28 NOTE — Progress Notes (Signed)
[place] at [facility]                                                                                                                                                                                  Patient Demographics   Sandra Charles, is a 63 y.o. female, DOB - 11-Mar-1955, LPF:[id number]  Admit date - 03/27/2018   Admitting Physician Henreitta Leber, MD  Outpatient Primary MD for the patient is Minda Ditto, MD   LOS - 1  Subjective: Patient admitted with acute encephalopathy, more awake Does not recall what happened yesterday   Review of Systems:   CONSTITUTIONAL: No documented fever. No fatigue, weakness. No weight gain, no weight loss.  EYES: No blurry or double vision.  ENT: No tinnitus. No postnasal drip. No redness of the oropharynx.  RESPIRATORY: No cough, no wheeze, no hemoptysis. No dyspnea.  CARDIOVASCULAR: No chest pain. No orthopnea. No palpitations. No syncope.  GASTROINTESTINAL: No nausea, no vomiting or diarrhea. No abdominal pain. No melena or hematochezia.  GENITOURINARY: No dysuria or hematuria.  ENDOCRINE: No polyuria or nocturia. No heat or cold intolerance.  HEMATOLOGY: No anemia. No bruising. No bleeding.  INTEGUMENTARY: No rashes. No lesions.  MUSCULOSKELETAL: No arthritis. No swelling. No gout.  NEUROLOGIC: No numbness, tingling, or ataxia. No seizure-type activity.  PSYCHIATRIC: No anxiety. No insomnia. No ADD.    Vitals:   Vitals:   03/27/18 2045 03/27/18 2049 03/27/18 2140 03/28/18 0442  BP: (!) 144/120 (!) 144/69 129/69 (!) 145/69  Pulse:  85 88 78  Resp:  19 19 19   Temp:   99 F (37.2 C) 97.7 F (36.5 C)  TempSrc:   Oral Oral  SpO2:  97% 100% 95%  Weight:   73.3 kg   Height:   5\' 1"  (1.549 m)     Wt Readings from Last 3 Encounters:  03/27/18 73.3 kg  10/05/17 69 kg  03/07/17 81.6 kg     Intake/Output Summary (Last 24 hours) at 03/28/2018 1110 Last data filed at 03/28/2018 0900 Gross per 24 hour   Intake 1785.54 ml  Output 600 ml  Net 1185.54 ml    Physical Exam:   GENERAL: Pleasant-appearing in no apparent distress.  HEAD, EYES, EARS, NOSE AND THROAT: Atraumatic, normocephalic. Extraocular muscles are intact. Pupils equal and reactive to light. Sclerae anicteric. No conjunctival injection. No oro-pharyngeal erythema.  NECK: Supple. There is no jugular venous distention. No bruits, no lymphadenopathy, no thyromegaly.  HEART: Regular rate and rhythm,. No murmurs, no rubs, no clicks.  LUNGS: Clear to auscultation bilaterally. No rales or rhonchi. No wheezes.  ABDOMEN: Soft, flat, nontender, nondistended. Has good bowel sounds.  No hepatosplenomegaly appreciated.  EXTREMITIES: No evidence of any cyanosis, clubbing, or peripheral edema.  +2 pedal and radial pulses bilaterally.  NEUROLOGIC: The patient is alert, awake, and oriented x3 with no focal motor or sensory deficits appreciated bilaterally.  SKIN: Moist and warm with no rashes appreciated.  Psych: Not anxious, depressed LN: No inguinal LN enlargement    Antibiotics   Anti-infectives (From admission, onward)   None      Medications   Scheduled Meds: . amLODipine  10 mg Oral Daily  . aspirin EC  81 mg Oral BH-q7a  . cholecalciferol  5,000 Units Oral BH-q7a  . cloNIDine  0.1 mg Oral BID  . donepezil  10 mg Oral QHS  . enoxaparin (LOVENOX) injection  40 mg Subcutaneous Q24H  . lamoTRIgine  100 mg Oral QHS  . LORazepam  2 mg Intravenous Once  . mouth rinse  15 mL Mouth Rinse BID  . pantoprazole  40 mg Oral BH-q7a  . propranolol ER  60 mg Oral Daily  . QUEtiapine  50 mg Oral QHS  . rosuvastatin  20 mg Oral QHS  . sertraline  100 mg Oral Daily   Continuous Infusions: . levETIRAcetam Stopped (03/28/18 0527)   PRN Meds:.acetaminophen **OR** acetaminophen, ondansetron **OR** ondansetron (ZOFRAN) IV   Data Review:   Micro Results No results found for this or any previous visit (from the past 240  hour(s)).  Radiology Reports Ct Head Wo Contrast  Result Date: 03/27/2018 CLINICAL DATA:  63 year old female with altered mental status following seizure. EXAM: CT HEAD WITHOUT CONTRAST TECHNIQUE: Contiguous axial images were obtained from the base of the skull through the vertex without intravenous contrast. COMPARISON:  10/06/2017 MR, 10/05/2017 CT and prior studies FINDINGS: Brain: No evidence of acute infarction, hemorrhage, hydrocephalus, extra-axial collection or mass lesion/mass effect. Mild chronic small-vessel white matter ischemic changes and remote RIGHT thalamic infarct again noted. Vascular: Carotid atherosclerotic calcifications again noted. Skull: Normal. Negative for fracture or focal lesion. Sinuses/Orbits: No acute finding. Other: None. IMPRESSION: 1. No evidence of acute intracranial abnormality 2. Mild chronic small-vessel white matter ischemic changes and remote RIGHT thalamic infarct. Electronically Signed   By: Margarette Canada M.D.   On: 03/27/2018 19:37     CBC Recent Labs  Lab 03/27/18 1833 03/28/18 0340  WBC 18.7* 11.8*  HGB 14.3 12.2  HCT 45.5 38.0  PLT 244 196  MCV 83.3 81.7  MCH 26.2 26.2  MCHC 31.4 32.1  RDW 15.9* 15.8*  LYMPHSABS 2.2  --   MONOABS 0.7  --   EOSABS 0.1  --   BASOSABS 0.1  --     Chemistries  Recent Labs  Lab 03/27/18 1833 03/28/18 0340  NA 139 138  K 4.3 4.1  CL 103 104  CO2 28 27  GLUCOSE 126* 100*  BUN 11 10  CREATININE 0.75 0.67  CALCIUM 9.9 9.2  AST 54*  --   ALT 61*  --   ALKPHOS 121  --   BILITOT 0.7  --    ------------------------------------------------------------------------------------------------------------------ estimated creatinine clearance is 68.5 mL/min (by C-G formula based on SCr of 0.67 mg/dL). ------------------------------------------------------------------------------------------------------------------ No results for input(s): HGBA1C in the last 72  hours. ------------------------------------------------------------------------------------------------------------------ No results for input(s): CHOL, HDL, LDLCALC, TRIG, CHOLHDL, LDLDIRECT in the last 72 hours. ------------------------------------------------------------------------------------------------------------------ Recent Labs    03/27/18 1833  TSH 6.026*   ------------------------------------------------------------------------------------------------------------------ No results for input(s): VITAMINB12, FOLATE, FERRITIN, TIBC, IRON, RETICCTPCT in the last 72 hours.  Coagulation profile No results for input(s):  INR, PROTIME in the last 168 hours.  No results for input(s): DDIMER in the last 72 hours.  Cardiac Enzymes No results for input(s): CKMB, TROPONINI, MYOGLOBIN in the last 168 hours.  Invalid input(s): CK ------------------------------------------------------------------------------------------------------------------ Invalid input(s): POCBNP    Assessment & Plan   63 year old female with past medical history of dementia, schizoaffective disorder, seizures, GERD, depression, history of previous CVA, hypertension, hyperlipidemia who presents to the hospital due to altered mental status/seizures.  1.  Altered mental status- Suspect this is due to seizures, neurology to see I ordered an MRI to further evaluate Continue Keppra and Lamictal   2.  Seizures- patient has history of seizures and is on Lamictal.  As above  3.  Essential hypertension-continue propranolol, clonidine, Norvasc.  4.  Hyperlipidemia-continue Crestor.  5.  History of schizoaffective disorder/depression- continue Seroquel, Zoloft.  6.  Dementia-continue Aricept.    7.  Leukocytosis reactive follow WBC count      Code Status Orders  (From admission, onward)         Start     Ordered   03/27/18 2141  Full code  Continuous     03/27/18 2141        Code Status  History    Date Active Date Inactive Code Status Order ID Comments User Context   10/05/2017 2128 10/09/2017 2010 Full Code [id number]  Lance Coon, MD ED   08/10/2015 1428 08/18/2015 1610 Full Code [id number]  Gonzella Lex, MD Inpatient   07/15/2015 1504 07/19/2015 1638 Full Code [id number]  Albertine Patricia, MD Inpatient   07/12/2015 1644 07/15/2015 1504 Full Code [id number]  Lytle Butte, MD ED   05/14/2015 0255 05/15/2015 1458 Full Code [id number]  Saundra Shelling, MD Inpatient           Consults neurology  DVT Prophylaxis  Lovenox  Lab Results  Component Value Date   PLT 196 03/28/2018     Time Spent in minutes   91min  Greater than 50% of time spent in care coordination and counseling patient regarding the condition and plan of care.   Dustin Flock M.D on 03/28/2018 at 11:10 AM  Between 7am to 6pm - Pager - [phone]  After 6pm go to [email or web address] - Proofreader  [employer]   Office  [phone]

## 2018-03-28 NOTE — Consult Note (Addendum)
Reason for Consult: Altered mental status seizure Referring Physician: Dustin Flock, MD  CC: Altered mental status seizures  HPI: Sandra Charles is an 63 y.o. female with past medical history of seizures(2017), cognitive deficit, hypertension, TIA,  hyperlipidemia, drug-induced tremors, and schizoaffective disorder presenting to the ED with witnessed seizure and altered mental status.  Per patient's niece who is currently at the bedside and provides most of the history as patient is altered, patient had what was described as an episode of bilateral upper and lower extremity shaking lasting few minutes when she came to she was noted to be altered and incoherent.  EMS was called and as they pulled in to pick up patient, patient had another seizure lasting few seconds. On arrival to the ED as EMS was pulling into the room patient had a third [place] seizure with loss of bladder control.  IV Ativan was administered and patient loaded with IV Keppra.  Initial CT head showed no evidence of acute intracranial abnormality.  Labs revealed glucose 119, elevated TSH 6.026, CK 233, ethanol levels< 10 mg/dL, AST 54, ALT 61, elevated white count 18.7, UDS positive for cannabinoid, UA negative for UTI.  Patient was therefore admitted for further work-up and management of seizures.  Past Medical History:  Diagnosis Date  . Anxiety   . Arthritis    "all over"  . Chronic lower back pain   . Depression   . GERD (gastroesophageal reflux disease)   . Headache    "weekly" (07/15/2015)  . Hyperlipidemia   . Hypertension   . Mini stroke ([place])    "several since 05/2014" (07/15/2015)  . Seizures ([place]) dx'd 04/2015    Past Surgical History:  Procedure Laterality Date  . DILATION AND CURETTAGE OF UTERUS    . LUMBAR PUNCTURE  07/14/2015  . TUBAL LIGATION    . VAGINAL HYSTERECTOMY      Family History  Problem Relation Age of Onset  . Hyperlipidemia Mother   . Breast cancer Mother 62  . Hypertension Mother   .  Heart attack Mother        age 28's  . Hyperlipidemia Father   . Hypertension Father   . Heart attack Father 35  . Breast cancer Sister 77    Social History:  reports that she has quit smoking. Her smoking use included cigarettes. She has a 5.00 pack-year smoking history. She has never used smokeless tobacco. She reports current alcohol use. She reports that she does not use drugs.  Allergies  Allergen Reactions  . Lovastatin Other (See Comments)    Unknown reaction    Medications:  I have reviewed the patient's current medications. Prior to Admission:  Medications Prior to Admission  Medication Sig Dispense Refill Last Dose  . amLODipine (NORVASC) 10 MG tablet Take 10 mg by mouth daily. For high blood pressure   03/27/2018 at 1000  . aspirin EC 81 MG tablet Take 81 mg by mouth every morning.    03/27/2018 at 1000  . Cholecalciferol (VITAMIN D3) 5000 UNITS TABS Take 5,000 Units by mouth every morning.    03/27/2018 at 1000  . cloNIDine (CATAPRES) 0.1 MG tablet Take 1 tablet (0.1 mg total) by mouth 2 (two) times daily. 60 tablet 11 03/27/2018 at 1000  . donepezil (ARICEPT) 10 MG tablet Take 10 mg by mouth at bedtime.   03/26/2018 at 1800  . lamoTRIgine (LAMICTAL) 100 MG tablet Take 100 mg by mouth at bedtime.   03/26/2018 at 1800  .  nystatin (NYSTATIN) powder Apply topically 2 (two) times daily.   prn at prn  . pantoprazole (PROTONIX) 40 MG tablet Take 1 tablet (40 mg total) by mouth daily. (Patient taking differently: Take 40 mg by mouth every morning. ) 30 tablet 11 03/27/2018 at 1000  . propranolol ER (INDERAL LA) 60 MG 24 hr capsule Take 60 mg by mouth daily.  11 03/27/2018 at 1000  . QUEtiapine (SEROQUEL XR) 50 MG TB24 24 hr tablet Take 50 mg by mouth at bedtime.   03/26/2018 at 1800  . rosuvastatin (CRESTOR) 20 MG tablet Take 20 mg by mouth at bedtime. For high cholesterol   03/26/2018 at 1800  . sertraline (ZOLOFT) 100 MG tablet Take 100 mg by mouth daily.  2 03/27/2018 at 1000  . vitamin  B-12 (CYANOCOBALAMIN) 1000 MCG tablet Take 1,000 mcg by mouth daily.   03/27/2018 at 1000   Scheduled: . amLODipine  10 mg Oral Daily  . aspirin EC  81 mg Oral BH-q7a  . cholecalciferol  5,000 Units Oral BH-q7a  . cloNIDine  0.1 mg Oral BID  . donepezil  10 mg Oral QHS  . enoxaparin (LOVENOX) injection  40 mg Subcutaneous Q24H  . lamoTRIgine  100 mg Oral QHS  . LORazepam  2 mg Intravenous Once  . mouth rinse  15 mL Mouth Rinse BID  . pantoprazole  40 mg Oral BH-q7a  . propranolol ER  60 mg Oral Daily  . QUEtiapine  50 mg Oral QHS  . rosuvastatin  20 mg Oral QHS  . sertraline  100 mg Oral Daily    ROS: Unable to obtain from patient due to altered mental status  Physical Examination: Blood pressure (!) 145/69, pulse 78, temperature 97.7 F (36.5 C), temperature source Oral, resp. rate 19, height 5\' 1"  (1.549 m), weight 73.3 kg, SpO2 95 %.  HEENT-  Normocephalic, no lesions, without obvious abnormality.  Normal external eye and conjunctiva.  Normal TM's bilaterally.  Normal auditory canals and external ears. Normal external nose, mucus membranes and septum.  Normal pharynx. Cardiovascular- S1, S2 normal, pulses palpable throughout   Lungs- chest clear, no wheezing, rales, normal symmetric air entry Abdomen- soft, non-tender; bowel sounds normal; no masses,  no organomegaly Extremities- no edema Lymph-no adenopathy palpable Musculoskeletal-no joint tenderness, deformity or swelling Skin-warm and dry, no hyperpigmentation, vitiligo, or suspicious lesions  Neurological Exam   Mental Status: Alert, oriented to person and place.  Unable to identify niece by name. Speech fluent without evidence of aphasia.  Able to follow simple commands however noted to have difficulty with processing information requiring multiple prompts and cueing.  Difficulty with retention and recall of information.  Cranial Nerves: II: Discs flat bilaterally; Visual fields grossly normal, pupils equal, round,  reactive to light and accommodation III,IV, VI: ptosis not present, extra-ocular motions intact bilaterally V,VII: smile symmetric, facial light touch sensation intact VIII: hearing normal bilaterally IX,X: gag reflex present XI: bilateral shoulder shrug XII: midline tongue extension Motor: Right :  Upper extremity   5/5 Without pronator drift      Left: Upper extremity   5/5 without pronator drift Right:   Lower extremity   5/5                                          Left: Lower extremity   5/5 Tone and bulk:normal tone throughout; no atrophy noted Sensory:  Pinprick and light touch intact bilaterally Deep Tendon Reflexes: 2+ and symmetric throughout Plantars: Right: mute                              Left: mute Cerebellar: Unable to accurately assess due to difficulty with processing information Gait: not tested due to safety concerns  Data Reviewed  Laboratory Studies:   Basic Metabolic Panel: Recent Labs  Lab 03/27/18 1833 03/28/18 0340  NA 139 138  K 4.3 4.1  CL 103 104  CO2 28 27  GLUCOSE 126* 100*  BUN 11 10  CREATININE 0.75 0.67  CALCIUM 9.9 9.2    Liver Function Tests: Recent Labs  Lab 03/27/18 1833  AST 54*  ALT 61*  ALKPHOS 121  BILITOT 0.7  PROT 8.8*  ALBUMIN 4.7   No results for input(s): LIPASE, AMYLASE in the last 168 hours. No results for input(s): AMMONIA in the last 168 hours.  CBC: Recent Labs  Lab 03/27/18 1833 03/28/18 0340  WBC 18.7* 11.8*  NEUTROABS 15.6*  --   HGB 14.3 12.2  HCT 45.5 38.0  MCV 83.3 81.7  PLT 244 196    Cardiac Enzymes: Recent Labs  Lab 03/27/18 1833  CKTOTAL 233    BNP: Invalid input(s): POCBNP  CBG: Recent Labs  Lab 03/27/18 1821  GLUCAP 119*    Microbiology: Results for orders placed or performed during the hospital encounter of 10/05/17  Urine culture     Status: Abnormal   Collection Time: 10/05/17  6:33 PM  Result Value Ref Range Status   Specimen Description   Final    URINE,  CATHETERIZED Performed at [facility], [address], [place], [place] [address]    Special Requests   Final    NONE Performed at [facility], [address] [place], [place] [address]    Culture >=100,000 COLONIES/mL KLEBSIELLA PNEUMONIAE (A)  Final   Report Status 10/08/2017 FINAL  Final   Organism ID, Bacteria KLEBSIELLA PNEUMONIAE (A)  Final      Susceptibility   Klebsiella pneumoniae - MIC*    AMPICILLIN RESISTANT Resistant     CEFAZOLIN <=4 SENSITIVE Sensitive     CEFTRIAXONE <=1 SENSITIVE Sensitive     CIPROFLOXACIN <=0.25 SENSITIVE Sensitive     GENTAMICIN <=1 SENSITIVE Sensitive     IMIPENEM <=0.25 SENSITIVE Sensitive     NITROFURANTOIN 64 INTERMEDIATE Intermediate     TRIMETH/SULFA >=320 RESISTANT Resistant     AMPICILLIN/SULBACTAM <=2 SENSITIVE Sensitive     PIP/TAZO <=4 SENSITIVE Sensitive     Extended ESBL NEGATIVE Sensitive     * >=100,000 COLONIES/mL KLEBSIELLA PNEUMONIAE    Coagulation Studies: No results for input(s): LABPROT, INR in the last 72 hours.  Urinalysis:  Recent Labs  Lab 03/27/18 2048  COLORURINE STRAW*  LABSPEC 1.012  PHURINE 7.0  GLUCOSEU NEGATIVE  HGBUR NEGATIVE  BILIRUBINUR NEGATIVE  KETONESUR NEGATIVE  PROTEINUR 30*  NITRITE NEGATIVE  LEUKOCYTESUR NEGATIVE    Lipid Panel:     Component Value Date/Time   CHOL 108 08/10/2015 1520   TRIG 92 08/10/2015 1520   HDL 42 08/10/2015 1520   CHOLHDL 2.6 08/10/2015 1520   VLDL 18 08/10/2015 1520   LDLCALC 48 08/10/2015 1520    HgbA1C:  Lab Results  Component Value Date   HGBA1C 6.1 (H) 08/10/2015    Urine Drug Screen:      Component Value Date/Time   LABOPIA NONE DETECTED  03/27/2018 2048   COCAINSCRNUR NONE DETECTED 03/27/2018 2048   LABBENZ NONE DETECTED 03/27/2018 2048   AMPHETMU NONE DETECTED 03/27/2018 2048   THCU POSITIVE (A) 03/27/2018 2048   LABBARB NONE DETECTED 03/27/2018 2048    Alcohol Level:  Recent Labs  Lab 03/27/18 1833  ETH <10     Other results: EKG: there are no previous tracings available for comparison.  Imaging: Ct Head Wo Contrast  Result Date: 03/27/2018 CLINICAL DATA:  62 year old female with altered mental status following seizure. EXAM: CT HEAD WITHOUT CONTRAST TECHNIQUE: Contiguous axial images were obtained from the base of the skull through the vertex without intravenous contrast. COMPARISON:  10/06/2017 MR, 10/05/2017 CT and prior studies FINDINGS: Brain: No evidence of acute infarction, hemorrhage, hydrocephalus, extra-axial collection or mass lesion/mass effect. Mild chronic small-vessel white matter ischemic changes and remote RIGHT thalamic infarct again noted. Vascular: Carotid atherosclerotic calcifications again noted. Skull: Normal. Negative for fracture or focal lesion. Sinuses/Orbits: No acute finding. Other: None. IMPRESSION: 1. No evidence of acute intracranial abnormality 2. Mild chronic small-vessel white matter ischemic changes and remote RIGHT thalamic infarct. Electronically Signed   By: Margarette Canada M.D.   On: 03/27/2018 19:37   Patient seen and examined.  Clinical course and management discussed.  Necessary edits performed.  I agree with the above.  Assessment and plan of care developed and discussed below.     Assessment: 63 y.o female with past medical history of seizures (2017), cognitive deficit, hypertension, TIA,  hyperlipidemia, drug-induced tremors, and schizoaffective disorder presenting to the ED with witnessed seizure and altered mental status.  Likely breakthrough seizures and metabolic encephalopathy in the setting of elevated LFTs, elevated white count and positive UDS for cannabinoid.  Patient has been evaluated for seizures in the past with extensive work-up including EEG, MRI brain, and lumbar puncture with CSF sent for an autoimmune encephalopathy panel which was unremarkable.She is currently being followed by [facility] neurology and was on Lamictal 100 mg daily prior to this  episode.  Per patient's niece, Lamictal was previously decreased from 100 mg bid to once a day by her psychiatrist at [facility] and that she has not missed any of her medication.  She has been tried on Depakote, Vimpat and Keppra in the past. Keppra caused increased agitation and irritability.  MRI of the brain performed and reviewed.  No acute changes noted.    Plan: 1. Seizure precautions 2. Ativan prn seizure activity 3. Stop Keppra and Increase Lamictal to 75 mg BID 4. Lamictal levels pending and can be followed up on an outpatient basis 5. After discharge patient to continue follow up with her outpatient neurologist.    This patient was staffed with Dr. Magda Paganini, Doy Mince who personally evaluated patient, reviewed documentation and agreed with assessment and plan of care as above.  Rufina Falco, DNP, FNP-BC Board certified Nurse Practitioner Neurology Department  03/28/2018, 9:54 AM   Alexis Goodell, MD Neurology [phone]  03/28/2018  2:02 PM

## 2018-03-29 MED ORDER — LAMOTRIGINE 25 MG PO TABS: 75.0000 mg | ORAL_TABLET | Freq: Two times a day (BID) | ORAL | 0 refills | Status: DC

## 2018-03-29 NOTE — Discharge Instructions (Signed)
It was so nice to meet you during this hospitalization!  You came into the hospital with seizures. You were seen by the neurologist, who increased your Lamictal dose from 100mg  once daily to 75mg  twice a day. Please make sure you pick up the new prescription from your pharmacy.  Take care, Dr. Brett Albino

## 2018-03-29 NOTE — Discharge Summary (Signed)
[place] [facility] [place] NAME: Sandra Charles    MR#:  [id number]  DATE OF BIRTH:  November 09, 1955  DATE OF ADMISSION:  03/27/2018   ADMITTING PHYSICIAN: Henreitta Leber, MD  DATE OF DISCHARGE: 03/29/2018 11:18 AM  PRIMARY CARE PHYSICIAN: Minda Ditto, MD   ADMISSION DIAGNOSIS:  Seizure ([place]) [R56.9] DISCHARGE DIAGNOSIS:  Active Problems:   Altered mental status  SECONDARY DIAGNOSIS:   Past Medical History:  Diagnosis Date  . Anxiety   . Arthritis    "all over"  . Chronic lower back pain   . Depression   . GERD (gastroesophageal reflux disease)   . Headache    "weekly" (07/15/2015)  . Hyperlipidemia   . Hypertension   . Mini stroke ([place])    "several since 05/2014" (07/15/2015)  . Seizures ([place]) dx'd 04/2015   HOSPITAL COURSE:   Sandra Charles is a 63 year old female who presented to the ED with seizures and confusion.  Per patient's fianc, she had 2 seizures and then had been altered since then.  She had another seizure in the ED and was loaded with IV Keppra in addition to Ativan. She was admitted for further management.  1. Altered mental status- felt to be postictal due to seizures -Altered mental status completely resolved on the day of discharge -MRI brain was negative  2. Seizures- patient has history of seizures. -Was taking Lamictal prior to admission -Loaded with Keppra and the ED -Seen by neurology, who stopped the [place] and increased her Lamictal dose to 75 mg twice daily -Needs to follow-up with neurology as an outpatient  3. Essential hypertension-continued propranolol, clonidine, norvasc.  4. Hyperlipidemia-continued Crestor.  5. History of schizoaffective disorder/depression- continued Seroquel, Zoloft.  6. Dementia-continued Aricept.    7.  Leukocytosis- likely reactive.  No signs of infection.  Improved on the day of discharge.  DISCHARGE CONDITIONS:  Seizures Hypertension Hyperlipidemia History of  schizoaffective disorder/depression Dementia CONSULTS OBTAINED:  Treatment Team:  Alexis Goodell, MD DRUG ALLERGIES:   Allergies  Allergen Reactions  . Lovastatin Other (See Comments)    Unknown reaction   DISCHARGE MEDICATIONS:   Allergies as of 03/29/2018      Reactions   Lovastatin Other (See Comments)   Unknown reaction      Medication List    TAKE these medications   amLODipine 10 MG tablet Commonly known as:  NORVASC Take 10 mg by mouth daily. For high blood pressure   aspirin EC 81 MG tablet Take 81 mg by mouth every morning.   cloNIDine 0.1 MG tablet Commonly known as:  CATAPRES Take 1 tablet (0.1 mg total) by mouth 2 (two) times daily.   donepezil 10 MG tablet Commonly known as:  ARICEPT Take 10 mg by mouth at bedtime.   lamoTRIgine 25 MG tablet Commonly known as:  LAMICTAL Take 3 tablets (75 mg total) by mouth 2 (two) times daily. What changed:    medication strength  how much to take  when to take this   nystatin powder Generic drug:  nystatin Apply topically 2 (two) times daily.   pantoprazole 40 MG tablet Commonly known as:  PROTONIX Take 1 tablet (40 mg total) by mouth daily. What changed:  when to take this   propranolol ER 60 MG 24 hr capsule Commonly known as:  INDERAL LA Take 60 mg by mouth daily.   QUEtiapine 50 MG Tb24 24 hr tablet Commonly known as:  SEROQUEL XR Take 50 mg by  mouth at bedtime.   rosuvastatin 20 MG tablet Commonly known as:  CRESTOR Take 20 mg by mouth at bedtime. For high cholesterol   sertraline 100 MG tablet Commonly known as:  ZOLOFT Take 100 mg by mouth daily.   vitamin B-12 1000 MCG tablet Commonly known as:  CYANOCOBALAMIN Take 1,000 mcg by mouth daily.   Vitamin D3 125 MCG (5000 UT) Tabs Take 5,000 Units by mouth every morning.        DISCHARGE INSTRUCTIONS:  1.  Follow-up with PCP in 5 days 2.  Follow-up with neurology in 1 to 2 weeks 3.  Increase Lamictal dose to 75 mg twice  daily DIET:  Cardiac diet DISCHARGE CONDITION:  Stable ACTIVITY:  Activity as tolerated OXYGEN:  Home Oxygen: No.  Oxygen Delivery: room air DISCHARGE LOCATION:  home   If you experience worsening of your admission symptoms, develop shortness of breath, life threatening emergency, suicidal or homicidal thoughts you must seek medical attention immediately by calling 911 or calling your MD immediately  if symptoms less severe.  You Must read complete instructions/literature along with all the possible adverse reactions/side effects for all the Medicines you take and that have been prescribed to you. Take any new Medicines after you have completely understood and accpet all the possible adverse reactions/side effects.   Please note  You were cared for by a hospitalist during your hospital stay. If you have any questions about your discharge medications or the care you received while you were in the hospital after you are discharged, you can call the unit and asked to speak with the hospitalist on call if the hospitalist that took care of you is not available. Once you are discharged, your primary care physician will handle any further medical issues. Please note that NO REFILLS for any discharge medications will be authorized once you are discharged, as it is imperative that you return to your primary care physician (or establish a relationship with a primary care physician if you do not have one) for your aftercare needs so that they can reassess your need for medications and monitor your lab values.    On the day of Discharge:  VITAL SIGNS:  Blood pressure (!) 147/71, pulse (!) 58, temperature 97.9 F (36.6 C), temperature source Oral, resp. rate 18, height 5\' 1"  (1.549 m), weight 73.3 kg, SpO2 100 %. PHYSICAL EXAMINATION:  GENERAL:  63 y.o.-year-old patient lying in the bed with no acute distress.  EYES: Pupils equal, round, reactive to light and accommodation. No scleral icterus.  Extraocular muscles intact.  HEENT: Head atraumatic, normocephalic. Oropharynx and nasopharynx clear.  NECK:  Supple, no jugular venous distention. No thyroid enlargement, no tenderness.  LUNGS: Normal breath sounds bilaterally, no wheezing, rales,rhonchi or crepitation. No use of accessory muscles of respiration.  CARDIOVASCULAR: RRR, S1, S2 normal. No murmurs, rubs, or gallops.  ABDOMEN: Soft, non-tender, non-distended. Bowel sounds present. No organomegaly or mass.  EXTREMITIES: No pedal edema, cyanosis, or clubbing.  NEUROLOGIC: Cranial nerves II through XII are intact. Muscle strength 5/5 in all extremities. Sensation intact. Gait not checked.  PSYCHIATRIC: The patient is alert and oriented x 3.  SKIN: No obvious rash, lesion, or ulcer.  DATA REVIEW:   CBC Recent Labs  Lab 03/28/18 0340  WBC 11.8*  HGB 12.2  HCT 38.0  PLT 196    Chemistries  Recent Labs  Lab 03/27/18 1833 03/28/18 0340  NA 139 138  K 4.3 4.1  CL 103 104  CO2 28  27  GLUCOSE 126* 100*  BUN 11 10  CREATININE 0.75 0.67  CALCIUM 9.9 9.2  AST 54*  --   ALT 61*  --   ALKPHOS 121  --   BILITOT 0.7  --      Microbiology Results  Results for orders placed or performed during the hospital encounter of 10/05/17  Urine culture     Status: Abnormal   Collection Time: 10/05/17  6:33 PM  Result Value Ref Range Status   Specimen Description   Final    URINE, CATHETERIZED Performed at [facility], [address], [place], [place] [address]    Special Requests   Final    NONE Performed at [facility], [address] [place], [place] [address]    Culture >=100,000 COLONIES/mL KLEBSIELLA PNEUMONIAE (A)  Final   Report Status 10/08/2017 FINAL  Final   Organism ID, Bacteria KLEBSIELLA PNEUMONIAE (A)  Final      Susceptibility   Klebsiella pneumoniae - MIC*    AMPICILLIN RESISTANT Resistant     CEFAZOLIN <=4 SENSITIVE Sensitive     CEFTRIAXONE <=1 SENSITIVE Sensitive      CIPROFLOXACIN <=0.25 SENSITIVE Sensitive     GENTAMICIN <=1 SENSITIVE Sensitive     IMIPENEM <=0.25 SENSITIVE Sensitive     NITROFURANTOIN 64 INTERMEDIATE Intermediate     TRIMETH/SULFA >=320 RESISTANT Resistant     AMPICILLIN/SULBACTAM <=2 SENSITIVE Sensitive     PIP/TAZO <=4 SENSITIVE Sensitive     Extended ESBL NEGATIVE Sensitive     * >=100,000 COLONIES/mL KLEBSIELLA PNEUMONIAE    RADIOLOGY:  Mr Brain Wo Contrast  Result Date: 03/28/2018 CLINICAL DATA:  Encephalopathy EXAM: MRI HEAD WITHOUT CONTRAST TECHNIQUE: Multiplanar, multiecho pulse sequences of the brain and surrounding structures were obtained without intravenous contrast. COMPARISON:  CT 03/27/2018.  MRI head 10/06/2017 FINDINGS: Brain: Image quality degraded by mild motion. Negative for acute infarct. Mild chronic white matter changes. Chronic infarct right thalamus. Mild chronic ischemia in the pons. Negative for hemorrhage or mass. Ventricle size normal. No midline shift. Vascular: Normal arterial flow voids Skull and upper cervical spine: Negative Sinuses/Orbits: Negative Other: None IMPRESSION: No acute abnormality.  Mild chronic microvascular ischemic change. Electronically Signed   By: Franchot Gallo M.D.   On: 03/28/2018 13:29     Management plans discussed with the patient, family and they are in agreement.  CODE STATUS: Full Code   TOTAL TIME TAKING CARE OF THIS PATIENT: 40 minutes.    Berna Spare Mayo M.D on 03/29/2018 at 12:15 PM  Between 7am to 6pm - Pager [phone]  After 6pm go to [email or web address] - Proofreader  [facility]  Office  [phone]  CC: Primary care physician; Minda Ditto, MD   Note: This dictation was prepared with Dragon dictation along with smaller phrase technology. Any transcriptional errors that result from this process are unintentional.

## 2018-03-31 LAB — LAMOTRIGINE LEVEL: Lamotrigine Lvl: 3.4 ug/mL (ref 2.0–20.0)

## 2018-04-10 ENCOUNTER — Other Ambulatory Visit: Payer: Self-pay | Admitting: Internal Medicine

## 2018-09-22 ENCOUNTER — Emergency Department: Payer: Medicare HMO

## 2018-09-22 ENCOUNTER — Encounter: Payer: Self-pay | Admitting: *Deleted

## 2018-09-22 ENCOUNTER — Other Ambulatory Visit: Payer: Self-pay

## 2018-09-22 ENCOUNTER — Inpatient Hospital Stay
Admission: EM | Admit: 2018-09-22 | Discharge: 2018-09-25 | DRG: 871 | Disposition: A | Discharge status: Home / Self Care | Payer: Medicare HMO | Attending: Internal Medicine | Admitting: Internal Medicine

## 2018-09-22 DIAGNOSIS — E785 Hyperlipidemia, unspecified: Secondary | ICD-10-CM | POA: Diagnosis present

## 2018-09-22 DIAGNOSIS — Z20828 Contact with and (suspected) exposure to other viral communicable diseases: Secondary | ICD-10-CM | POA: Diagnosis present

## 2018-09-22 DIAGNOSIS — G3183 Dementia with Lewy bodies: Secondary | ICD-10-CM | POA: Diagnosis present

## 2018-09-22 DIAGNOSIS — I10 Essential (primary) hypertension: Secondary | ICD-10-CM | POA: Diagnosis present

## 2018-09-22 DIAGNOSIS — A419 Sepsis, unspecified organism: Secondary | ICD-10-CM | POA: Diagnosis present

## 2018-09-22 DIAGNOSIS — R509 Fever, unspecified: Secondary | ICD-10-CM

## 2018-09-22 DIAGNOSIS — N390 Urinary tract infection, site not specified: Secondary | ICD-10-CM | POA: Diagnosis present

## 2018-09-22 DIAGNOSIS — Z87891 Personal history of nicotine dependence: Secondary | ICD-10-CM

## 2018-09-22 DIAGNOSIS — F250 Schizoaffective disorder, bipolar type: Secondary | ICD-10-CM

## 2018-09-22 DIAGNOSIS — Z79899 Other long term (current) drug therapy: Secondary | ICD-10-CM

## 2018-09-22 DIAGNOSIS — F329 Major depressive disorder, single episode, unspecified: Secondary | ICD-10-CM | POA: Diagnosis present

## 2018-09-22 DIAGNOSIS — F259 Schizoaffective disorder, unspecified: Secondary | ICD-10-CM | POA: Diagnosis present

## 2018-09-22 DIAGNOSIS — K219 Gastro-esophageal reflux disease without esophagitis: Secondary | ICD-10-CM | POA: Diagnosis present

## 2018-09-22 DIAGNOSIS — R569 Unspecified convulsions: Secondary | ICD-10-CM | POA: Diagnosis present

## 2018-09-22 DIAGNOSIS — E872 Acidosis: Secondary | ICD-10-CM | POA: Diagnosis present

## 2018-09-22 DIAGNOSIS — F0280 Dementia in other diseases classified elsewhere without behavioral disturbance: Secondary | ICD-10-CM | POA: Diagnosis present

## 2018-09-22 DIAGNOSIS — Z8673 Personal history of transient ischemic attack (TIA), and cerebral infarction without residual deficits: Secondary | ICD-10-CM | POA: Diagnosis not present

## 2018-09-22 DIAGNOSIS — Z90710 Acquired absence of both cervix and uterus: Secondary | ICD-10-CM

## 2018-09-22 DIAGNOSIS — G039 Meningitis, unspecified: Secondary | ICD-10-CM | POA: Diagnosis present

## 2018-09-22 DIAGNOSIS — F419 Anxiety disorder, unspecified: Secondary | ICD-10-CM | POA: Diagnosis present

## 2018-09-22 DIAGNOSIS — Z7982 Long term (current) use of aspirin: Secondary | ICD-10-CM

## 2018-09-22 DIAGNOSIS — R440 Auditory hallucinations: Secondary | ICD-10-CM | POA: Diagnosis not present

## 2018-09-22 LAB — URINALYSIS, ROUTINE W REFLEX MICROSCOPIC
Bacteria, UA: NONE SEEN
Bilirubin Urine: NEGATIVE
Glucose, UA: 50 mg/dL — AB
Hgb urine dipstick: NEGATIVE
Ketones, ur: NEGATIVE mg/dL
Leukocytes,Ua: NEGATIVE
Nitrite: NEGATIVE
Protein, ur: 100 mg/dL — AB
Specific Gravity, Urine: 1.017 (ref 1.005–1.030)
Squamous Epithelial / HPF: NONE SEEN (ref 0–5)
pH: 6 (ref 5.0–8.0)

## 2018-09-22 LAB — CBC WITH DIFFERENTIAL/PLATELET
Abs Immature Granulocytes: 0.05 10*3/uL (ref 0.00–0.07)
Basophils Absolute: 0 10*3/uL (ref 0.0–0.1)
Basophils Relative: 0 %
Eosinophils Absolute: 0 10*3/uL (ref 0.0–0.5)
Eosinophils Relative: 0 %
HCT: 43.4 % (ref 36.0–46.0)
Hemoglobin: 13.3 g/dL (ref 12.0–15.0)
Immature Granulocytes: 0 %
Lymphocytes Relative: 7 %
Lymphs Abs: 1 10*3/uL (ref 0.7–4.0)
MCH: 25.8 pg — ABNORMAL LOW (ref 26.0–34.0)
MCHC: 30.6 g/dL (ref 30.0–36.0)
MCV: 84.3 fL (ref 80.0–100.0)
Monocytes Absolute: 0.5 10*3/uL (ref 0.1–1.0)
Monocytes Relative: 3 %
Neutro Abs: 12.5 10*3/uL — ABNORMAL HIGH (ref 1.7–7.7)
Neutrophils Relative %: 90 %
Platelets: 231 10*3/uL (ref 150–400)
RBC: 5.15 MIL/uL — ABNORMAL HIGH (ref 3.87–5.11)
RDW: 15.8 % — ABNORMAL HIGH (ref 11.5–15.5)
WBC: 14.1 10*3/uL — ABNORMAL HIGH (ref 4.0–10.5)
nRBC: 0 % (ref 0.0–0.2)

## 2018-09-22 LAB — URINE DRUG SCREEN, QUALITATIVE (ARMC ONLY)
Amphetamines, Ur Screen: NOT DETECTED
Barbiturates, Ur Screen: NOT DETECTED
Benzodiazepine, Ur Scrn: POSITIVE — AB
Cannabinoid 50 Ng, Ur ~~LOC~~: POSITIVE — AB
Cocaine Metabolite,Ur ~~LOC~~: NOT DETECTED
MDMA (Ecstasy)Ur Screen: NOT DETECTED
Methadone Scn, Ur: NOT DETECTED
Opiate, Ur Screen: NOT DETECTED
Phencyclidine (PCP) Ur S: NOT DETECTED
Tricyclic, Ur Screen: NOT DETECTED

## 2018-09-22 LAB — COMPREHENSIVE METABOLIC PANEL
ALT: 29 U/L (ref 0–44)
AST: 34 U/L (ref 15–41)
Albumin: 4.6 g/dL (ref 3.5–5.0)
Alkaline Phosphatase: 106 U/L (ref 38–126)
Anion gap: 16 — ABNORMAL HIGH (ref 5–15)
BUN: 12 mg/dL (ref 6–20)
CO2: 22 mmol/L (ref 22–32)
Calcium: 9.9 mg/dL (ref 8.9–10.3)
Chloride: 100 mmol/L (ref 98–111)
Creatinine, Ser: 1.05 mg/dL — ABNORMAL HIGH (ref 0.44–1.00)
GFR calc Af Amer: >60 mL/min (ref >60)
GFR calc non Af Amer: 58 mL/min — ABNORMAL LOW (ref >60)
Glucose, Bld: 135 mg/dL — ABNORMAL HIGH (ref 70–99)
Potassium: 4.3 mmol/L (ref 3.5–5.1)
Sodium: 138 mmol/L (ref 135–145)
Total Bilirubin: 0.6 mg/dL (ref 0.3–1.2)
Total Protein: 8.8 g/dL — ABNORMAL HIGH (ref 6.5–8.1)

## 2018-09-22 LAB — SARS CORONAVIRUS 2 BY RT PCR (HOSPITAL ORDER, PERFORMED IN ~~LOC~~ HOSPITAL LAB): SARS Coronavirus 2: NEGATIVE

## 2018-09-22 LAB — LACTIC ACID, PLASMA: Lactic Acid, Venous: 5.6 mmol/L (ref 0.5–1.9)

## 2018-09-22 MED ORDER — PANTOPRAZOLE SODIUM 40 MG PO TBEC
40.0000 mg | DELAYED_RELEASE_TABLET | ORAL | Status: DC
  Administered 2018-09-23 – 2018-09-25 (×3): 40 mg via ORAL
  Filled 2018-09-22 (×3): qty 1

## 2018-09-22 MED ORDER — DONEPEZIL HCL 5 MG PO TABS
10.0000 mg | ORAL_TABLET | Freq: Every day | ORAL | Status: DC
  Administered 2018-09-23 – 2018-09-24 (×3): 10 mg via ORAL
  Filled 2018-09-22 (×4): qty 2

## 2018-09-22 MED ORDER — ONDANSETRON HCL 4 MG PO TABS: 4.0000 mg | ORAL_TABLET | Freq: Four times a day (QID) | ORAL | Status: DC | PRN

## 2018-09-22 MED ORDER — VITAMIN D 25 MCG (1000 UNIT) PO TABS
5000.0000 [IU] | ORAL_TABLET | ORAL | Status: DC
  Administered 2018-09-23 – 2018-09-25 (×3): 5000 [IU] via ORAL
  Filled 2018-09-22 (×3): qty 5

## 2018-09-22 MED ORDER — SODIUM CHLORIDE 0.9 % IV SOLN
INTRAVENOUS | Status: DC
  Administered 2018-09-22 – 2018-09-25 (×5): via INTRAVENOUS

## 2018-09-22 MED ORDER — METRONIDAZOLE IN NACL 5-0.79 MG/ML-% IV SOLN
500.0000 mg | Freq: Once | INTRAVENOUS | Status: AC
  Administered 2018-09-22: 19:00:00 500 mg via INTRAVENOUS
  Filled 2018-09-22: qty 100

## 2018-09-22 MED ORDER — LORAZEPAM 2 MG/ML IJ SOLN
0.5000 mg | Freq: Once | INTRAMUSCULAR | Status: AC
  Administered 2018-09-22: 0.5 mg via INTRAVENOUS

## 2018-09-22 MED ORDER — PROPRANOLOL HCL ER 60 MG PO CP24
60.0000 mg | ORAL_CAPSULE | Freq: Every day | ORAL | Status: DC
  Administered 2018-09-23 – 2018-09-25 (×3): 60 mg via ORAL
  Filled 2018-09-22 (×3): qty 1

## 2018-09-22 MED ORDER — VANCOMYCIN HCL IN DEXTROSE 1-5 GM/200ML-% IV SOLN
1000.0000 mg | Freq: Once | INTRAVENOUS | Status: AC
  Administered 2018-09-22: 1000 mg via INTRAVENOUS
  Filled 2018-09-22: qty 200

## 2018-09-22 MED ORDER — QUETIAPINE FUMARATE ER 50 MG PO TB24
50.0000 mg | ORAL_TABLET | Freq: Every day | ORAL | Status: DC
  Administered 2018-09-23 – 2018-09-24 (×3): 50 mg via ORAL
  Filled 2018-09-22 (×4): qty 1

## 2018-09-22 MED ORDER — ACETAMINOPHEN 325 MG PO TABS: 650.0000 mg | ORAL_TABLET | Freq: Four times a day (QID) | ORAL | Status: DC | PRN

## 2018-09-22 MED ORDER — NYSTATIN 100000 UNIT/GM EX POWD
Freq: Two times a day (BID) | CUTANEOUS | Status: DC
  Filled 2018-09-22: qty 15

## 2018-09-22 MED ORDER — CLONIDINE HCL 0.1 MG PO TABS
0.1000 mg | ORAL_TABLET | Freq: Two times a day (BID) | ORAL | Status: DC
  Administered 2018-09-23 – 2018-09-25 (×5): 0.1 mg via ORAL
  Filled 2018-09-22 (×6): qty 1

## 2018-09-22 MED ORDER — SODIUM CHLORIDE 0.9 % IV BOLUS
1000.0000 mL | Freq: Once | INTRAVENOUS | Status: AC
  Administered 2018-09-22: 1000 mL via INTRAVENOUS

## 2018-09-22 MED ORDER — LAMOTRIGINE 25 MG PO TABS
75.0000 mg | ORAL_TABLET | Freq: Two times a day (BID) | ORAL | Status: DC
  Administered 2018-09-23 – 2018-09-25 (×6): 75 mg via ORAL
  Filled 2018-09-22 (×7): qty 3

## 2018-09-22 MED ORDER — VITAMIN B-12 1000 MCG PO TABS
1000.0000 ug | ORAL_TABLET | Freq: Every day | ORAL | Status: DC
  Administered 2018-09-23 – 2018-09-25 (×3): 1000 ug via ORAL
  Filled 2018-09-22 (×3): qty 1

## 2018-09-22 MED ORDER — ACETAMINOPHEN 10 MG/ML IV SOLN
1000.0000 mg | Freq: Four times a day (QID) | INTRAVENOUS | Status: DC
  Administered 2018-09-22 – 2018-09-23 (×3): 1000 mg via INTRAVENOUS
  Filled 2018-09-22 (×6): qty 100

## 2018-09-22 MED ORDER — LORAZEPAM 2 MG/ML IJ SOLN
0.5000 mg | Freq: Once | INTRAMUSCULAR | Status: AC
  Administered 2018-09-22: 1 mg via INTRAVENOUS
  Filled 2018-09-22: qty 1

## 2018-09-22 MED ORDER — SODIUM CHLORIDE 0.9 % IV SOLN
2.0000 g | Freq: Once | INTRAVENOUS | Status: AC
  Administered 2018-09-22: 19:00:00 2 g via INTRAVENOUS
  Filled 2018-09-22: qty 2

## 2018-09-22 MED ORDER — NYSTATIN 100000 UNIT/GM EX POWD
Freq: Two times a day (BID) | CUTANEOUS | Status: DC | PRN
  Filled 2018-09-22: qty 15

## 2018-09-22 MED ORDER — AMLODIPINE BESYLATE 10 MG PO TABS
10.0000 mg | ORAL_TABLET | Freq: Every day | ORAL | Status: DC
  Administered 2018-09-23 – 2018-09-25 (×3): 10 mg via ORAL
  Filled 2018-09-22 (×3): qty 1

## 2018-09-22 MED ORDER — ACETAMINOPHEN 650 MG RE SUPP: 650.0000 mg | Freq: Four times a day (QID) | RECTAL | Status: DC | PRN

## 2018-09-22 MED ORDER — ENOXAPARIN SODIUM 40 MG/0.4ML ~~LOC~~ SOLN
40.0000 mg | SUBCUTANEOUS | Status: DC
  Administered 2018-09-23 – 2018-09-24 (×3): 40 mg via SUBCUTANEOUS
  Filled 2018-09-22 (×3): qty 0.4

## 2018-09-22 MED ORDER — ONDANSETRON HCL 4 MG/2ML IJ SOLN: 4.0000 mg | Freq: Four times a day (QID) | INTRAMUSCULAR | Status: DC | PRN

## 2018-09-22 MED ORDER — SERTRALINE HCL 50 MG PO TABS
100.0000 mg | ORAL_TABLET | Freq: Every day | ORAL | Status: DC
  Administered 2018-09-23 – 2018-09-25 (×3): 100 mg via ORAL
  Filled 2018-09-22 (×3): qty 2

## 2018-09-22 MED ORDER — POLYETHYLENE GLYCOL 3350 17 G PO PACK: 17.0000 g | PACK | Freq: Every day | ORAL | Status: DC | PRN

## 2018-09-22 MED ORDER — ASPIRIN EC 81 MG PO TBEC
81.0000 mg | DELAYED_RELEASE_TABLET | ORAL | Status: DC
  Administered 2018-09-23 – 2018-09-25 (×3): 81 mg via ORAL
  Filled 2018-09-22 (×3): qty 1

## 2018-09-22 MED ORDER — LORAZEPAM 2 MG/ML IJ SOLN
1.0000 mg | Freq: Four times a day (QID) | INTRAMUSCULAR | Status: DC | PRN
  Administered 2018-09-25: 1 mg via INTRAVENOUS
  Filled 2018-09-22 (×2): qty 1

## 2018-09-22 MED ORDER — ROSUVASTATIN CALCIUM 10 MG PO TABS
20.0000 mg | ORAL_TABLET | Freq: Every day | ORAL | Status: DC
  Administered 2018-09-23 – 2018-09-24 (×3): 20 mg via ORAL
  Filled 2018-09-22 (×3): qty 2

## 2018-09-22 NOTE — ED Notes (Signed)
ED TO INPATIENT HANDOFF REPORT  ED Nurse Name and Phone #: Jevaeh Shams  S Name/Age/Gender Sandra Charles 63 y.o. female Room/Bed: ED16A/ED16A  Code Status   Code Status: Prior  Home/SNF/Other Home Patient oriented to: self, place, time and situation Is this baseline? Yes   Triage Complete: Triage complete  Chief Complaint Seizures  Triage Note Pt brought in via ems from home with seizures.  Pt postictal on arrival to ER.  Ems gave 2mg  versed. sierails up x 2.   Allergies Allergies  Allergen Reactions  . Lovastatin Other (See Comments)    Unknown reaction    Level of Care/Admitting Diagnosis ED Disposition    ED Disposition Condition [facility] Area: [place] [100120]  Level of Care: Telemetry [5]  Covid Evaluation: Confirmed COVID Negative  Diagnosis: Sepsis [facility]) [[id number]]  Admitting Physician: Odessa Fleming  Attending Physician: Odessa Fleming  Estimated length of stay: past midnight tomorrow  Certification:: I certify this patient will need inpatient services for at least 2 midnights  PT Class (Do Not Modify): Inpatient [101]  PT Acc Code (Do Not Modify): Private [1]       B Medical/Surgery History Past Medical History:  Diagnosis Date  . Anxiety   . Arthritis    "all over"  . Chronic lower back pain   . Depression   . GERD (gastroesophageal reflux disease)   . Headache    "weekly" (07/15/2015)  . Hyperlipidemia   . Hypertension   . Mini stroke ([place])    "several since 05/2014" (07/15/2015)  . Seizures ([place]) dx'd 04/2015   Past Surgical History:  Procedure Laterality Date  . DILATION AND CURETTAGE OF UTERUS    . LUMBAR PUNCTURE  07/14/2015  . TUBAL LIGATION    . VAGINAL HYSTERECTOMY       A IV Location/Drains/Wounds Patient Lines/Drains/Airways Status   Active Line/Drains/Airways    Name:   Placement date:   Placement time:   Site:   Days:   Peripheral IV 03/27/18 Right Hand   03/27/18    1847    Hand    179   Peripheral IV 03/27/18 Left Hand   03/27/18    1847    Hand   179   Peripheral IV 09/22/18 Right Hand   09/22/18    1801    Hand   less than 1   Peripheral IV 09/22/18 Left Antecubital   09/22/18    1816    Antecubital   less than 1   Peripheral IV 09/22/18 Wrist   09/22/18    1817    Wrist   less than 1   External Urinary Catheter   03/27/18    2120    -   179          Intake/Output Last 24 hours  Intake/Output Summary (Last 24 hours) at 09/22/2018 2232 Last data filed at 09/22/2018 1936 Gross per 24 hour  Intake 100 ml  Output -  Net 100 ml    Labs/Imaging Results for orders placed or performed during the hospital encounter of 09/22/18 (from the past 48 hour(s))  Comprehensive metabolic panel     Status: Abnormal   Collection Time: 09/22/18  6:00 PM  Result Value Ref Range   Sodium 138 135 - 145 mmol/L   Potassium 4.3 3.5 - 5.1 mmol/L   Chloride 100 98 - 111 mmol/L   CO2 22 22 - 32 mmol/L   Glucose, Bld 135 (  H) 70 - 99 mg/dL   BUN 12 6 - 20 mg/dL   Creatinine, Ser 1.05 (H) 0.44 - 1.00 mg/dL   Calcium 9.9 8.9 - 10.3 mg/dL   Total Protein 8.8 (H) 6.5 - 8.1 g/dL   Albumin 4.6 3.5 - 5.0 g/dL   AST 34 15 - 41 U/L   ALT 29 0 - 44 U/L   Alkaline Phosphatase 106 38 - 126 U/L   Total Bilirubin 0.6 0.3 - 1.2 mg/dL   GFR calc non Af Amer 58 (L) >60 mL/min   GFR calc Af Amer >60 >60 mL/min   Anion gap 16 (H) 5 - 15    Comment: Performed at [facility], [address] [place], [place] [address]  CBC with Differential     Status: Abnormal   Collection Time: 09/22/18  6:00 PM  Result Value Ref Range   WBC 14.1 (H) 4.0 - 10.5 K/uL   RBC 5.15 (H) 3.87 - 5.11 MIL/uL   Hemoglobin 13.3 12.0 - 15.0 g/dL   HCT 43.4 36.0 - 46.0 %   MCV 84.3 80.0 - 100.0 fL   MCH 25.8 (L) 26.0 - 34.0 pg   MCHC 30.6 30.0 - 36.0 g/dL   RDW 15.8 (H) 11.5 - 15.5 %   Platelets 231 150 - 400 K/uL   nRBC 0.0 0.0 - 0.2 %   Neutrophils Relative % 90 %   Neutro Abs 12.5 (H) 1.7 - 7.7 K/uL    Lymphocytes Relative 7 %   Lymphs Abs 1.0 0.7 - 4.0 K/uL   Monocytes Relative 3 %   Monocytes Absolute 0.5 0.1 - 1.0 K/uL   Eosinophils Relative 0 %   Eosinophils Absolute 0.0 0.0 - 0.5 K/uL   Basophils Relative 0 %   Basophils Absolute 0.0 0.0 - 0.1 K/uL   Immature Granulocytes 0 %   Abs Immature Granulocytes 0.05 0.00 - 0.07 K/uL    Comment: Performed at [facility], [address] [place], [place] [address]  Urinalysis, Routine w reflex microscopic     Status: Abnormal   Collection Time: 09/22/18  6:01 PM  Result Value Ref Range   Color, Urine YELLOW (A) YELLOW   APPearance HAZY (A) CLEAR   Specific Gravity, Urine 1.017 1.005 - 1.030   pH 6.0 5.0 - 8.0   Glucose, UA 50 (A) NEGATIVE mg/dL   Hgb urine dipstick NEGATIVE NEGATIVE   Bilirubin Urine NEGATIVE NEGATIVE   Ketones, ur NEGATIVE NEGATIVE mg/dL   Protein, ur 100 (A) NEGATIVE mg/dL   Nitrite NEGATIVE NEGATIVE   Leukocytes,Ua NEGATIVE NEGATIVE   RBC / HPF 0-5 0 - 5 RBC/hpf   WBC, UA 11-20 0 - 5 WBC/hpf   Bacteria, UA NONE SEEN NONE SEEN   Squamous Epithelial / LPF NONE SEEN 0 - 5   Mucus PRESENT    Hyaline Casts, UA PRESENT     Comment: Performed at [facility], [address], [place], [place] [address]  Urine Drug Screen, Qualitative ([facility] only)     Status: Abnormal   Collection Time: 09/22/18  6:01 PM  Result Value Ref Range   Tricyclic, Ur Screen NONE DETECTED NONE DETECTED   Amphetamines, Ur Screen NONE DETECTED NONE DETECTED   MDMA (Ecstasy)Ur Screen NONE DETECTED NONE DETECTED   Cocaine Metabolite,Ur [place] NONE DETECTED NONE DETECTED   Opiate, Ur Screen NONE DETECTED NONE DETECTED   Phencyclidine (PCP) Ur S NONE DETECTED NONE DETECTED   Cannabinoid 50 Ng, Ur [place] POSITIVE (  A) NONE DETECTED   Barbiturates, Ur Screen NONE DETECTED NONE DETECTED   Benzodiazepine, Ur Scrn POSITIVE (A) NONE DETECTED   Methadone Scn, Ur NONE DETECTED NONE DETECTED    Comment: (NOTE) Tricyclics + metabolites,  urine    Cutoff 1000 ng/mL Amphetamines + metabolites, urine  Cutoff 1000 ng/mL MDMA (Ecstasy), urine              Cutoff 500 ng/mL Cocaine Metabolite, urine          Cutoff 300 ng/mL Opiate + metabolites, urine        Cutoff 300 ng/mL Phencyclidine (PCP), urine         Cutoff 25 ng/mL Cannabinoid, urine                 Cutoff 50 ng/mL Barbiturates + metabolites, urine  Cutoff 200 ng/mL Benzodiazepine, urine              Cutoff 200 ng/mL Methadone, urine                   Cutoff 300 ng/mL The urine drug screen provides only a preliminary, unconfirmed analytical test result and should not be used for non-medical purposes. Clinical consideration and professional judgment should be applied to any positive drug screen result due to possible interfering substances. A more specific alternate chemical method must be used in order to obtain a confirmed analytical result. Gas chromatography / mass spectrometry (GC/MS) is the preferred confirmat ory method. Performed at [facility], [address] [place], [place] [address]   SARS Coronavirus 2 (CEPHEID - Performed in [facility] hospital lab), Hosp Order     Status: None   Collection Time: 09/22/18  6:05 PM   Specimen: Nasopharyngeal Swab  Result Value Ref Range   SARS Coronavirus 2 NEGATIVE NEGATIVE    Comment: (NOTE) If result is NEGATIVE SARS-CoV-2 target nucleic acids are NOT DETECTED. The SARS-CoV-2 RNA is generally detectable in upper and lower  respiratory specimens during the acute phase of infection. The lowest  concentration of SARS-CoV-2 viral copies this assay can detect is 250  copies / mL. A negative result does not preclude SARS-CoV-2 infection  and should not be used as the sole basis for treatment or other  patient management decisions.  A negative result may occur with  improper specimen collection / handling, submission of specimen other  than nasopharyngeal swab, presence of viral mutation(s) within the   areas targeted by this assay, and inadequate number of viral copies  (<250 copies / mL). A negative result must be combined with clinical  observations, patient history, and epidemiological information. If result is POSITIVE SARS-CoV-2 target nucleic acids are DETECTED. The SARS-CoV-2 RNA is generally detectable in upper and lower  respiratory specimens dur ing the acute phase of infection.  Positive  results are indicative of active infection with SARS-CoV-2.  Clinical  correlation with patient history and other diagnostic information is  necessary to determine patient infection status.  Positive results do  not rule out bacterial infection or co-infection with other viruses. If result is PRESUMPTIVE POSTIVE SARS-CoV-2 nucleic acids MAY BE PRESENT.   A presumptive positive result was obtained on the submitted specimen  and confirmed on repeat testing.  While 2019 novel coronavirus  (SARS-CoV-2) nucleic acids may be present in the submitted sample  additional confirmatory testing may be necessary for epidemiological  and / or clinical management purposes  to differentiate between  SARS-CoV-2 and other Sarbecovirus currently known to infect  humans.  If clinically indicated additional testing with an alternate test  methodology [phone]) is advised. The SARS-CoV-2 RNA is generally  detectable in upper and lower respiratory sp ecimens during the acute  phase of infection. The expected result is Negative. Fact Sheet for Patients:  StrictlyIdeas.no Fact Sheet for Healthcare Providers: BankingDealers.co.za This test is not yet approved or cleared by the [place] FDA and has been authorized for detection and/or diagnosis of SARS-CoV-2 by FDA under an Emergency Use Authorization (EUA).  This EUA will remain in effect (meaning this test can be used) for the duration of the COVID-19 declaration under Section 564(b)(1) of the Act, 21  U.S.C. section 360bbb-3(b)(1), unless the authorization is terminated or revoked sooner. Performed at [facility], [address] [place], [place] [address]   Lactic acid, plasma     Status: Abnormal   Collection Time: 09/22/18  6:05 PM  Result Value Ref Range   Lactic Acid, Venous 5.6 (HH) 0.5 - 1.9 mmol/L    Comment: CRITICAL RESULT CALLED TO, READ BACK BY AND VERIFIED WITH Verbie Babic COYEN AT 1845 09/22/2018.  TFK Performed at [facility], [place],  [address]    Ct Head Wo Contrast  Result Date: 09/22/2018 CLINICAL DATA:  Seizure EXAM: CT HEAD WITHOUT CONTRAST TECHNIQUE: Contiguous axial images were obtained from the base of the skull through the vertex without intravenous contrast. COMPARISON:  CT head 03/27/2018.  MRI 03/28/2018 FINDINGS: Brain: Ventricle size normal. Patchy bilateral white matter disease unchanged from prior studies. Negative for acute infarct, hemorrhage, mass. No fluid collection or midline shift. Vascular: Negative for hyperdense vessel Skull: Negative Sinuses/Orbits: Negative Other: None IMPRESSION: No acute abnormality. Chronic white matter changes stable from prior studies Electronically Signed   By: Franchot Gallo M.D.   On: 09/22/2018 19:11   Dg Chest Portable 1 View  Result Date: 09/22/2018 CLINICAL DATA:  Pt brought in via ems from home with a seizure. Ems report 3rd seizure of the day. Pt postictal on arrival . Seizure pads in place. md at bedside . Iv in place. Pt on 2 liters oxygen. Ems report pt is taking her seizure meds per family. EXAM: PORTABLE CHEST - 1 VIEW COMPARISON:  10/05/2017 FINDINGS: Lungs clear. Heart size and mediastinal contours are within normal limits. No effusion. Visualized bones unremarkable. IMPRESSION: No acute cardiopulmonary disease. Electronically Signed   By: Lucrezia Europe M.D.   On: 09/22/2018 19:20    Pending Labs Unresulted Labs (From admission, onward)    Start     Ordered   09/22/18  2114  VDRL, CSF  (Meningitis Panel)  Once,   STAT     09/22/18 2113   09/22/18 2110  CSF cell count with differential collection tube #: 1  (Meningitis Panel)  Once,   STAT    Question:  collection tube #  Answer:  1   09/22/18 2113   09/22/18 2110  CSF cell count with differential collection tube #: 4  (Meningitis Panel)  Once,   STAT    Question:  collection tube #  Answer:  4   09/22/18 2113   09/22/18 2110  CSF culture  (Meningitis Panel)  Once,   STAT    Question:  Are there also cytology or pathology orders on this specimen?  Answer:  No   09/22/18 2113   09/22/18 2110  Gram stain  (Meningitis Panel)  ONCE - STAT,   STAT     09/22/18 2113  09/22/18 2110  Protein and glucose, CSF  (Meningitis Panel)  Once,   STAT     09/22/18 2113   09/22/18 2110  CBC with Differential  (Meningitis Panel)  Once,   STAT     09/22/18 2113   09/22/18 2110  HIV antibody  (Meningitis Panel)  Once,   STAT     09/22/18 2113   09/22/18 2110  Herpes simplex virus (HSV), DNA by PCR Cerebrospinal Fluid  (Meningitis Panel)  Once,   STAT     09/22/18 2113   09/22/18 2110  Cryptococcal antigen, CSF  (Meningitis Panel)  Once,   STAT     09/22/18 2113   09/22/18 1849  Protime-INR  ONCE - STAT,   STAT     09/22/18 1849   09/22/18 1849  APTT  ONCE - STAT,   STAT     09/22/18 1849   09/22/18 1848  Urine culture  ONCE - STAT,   STAT     09/22/18 1848   09/22/18 1805  Lactic acid, plasma  Now then every 2 hours,   STAT     09/22/18 1804   09/22/18 1804  Blood culture (routine x 2)  BLOOD CULTURE X 2,   STAT     09/22/18 1804   09/22/18 1801  Lamotrigine level  Once,   STAT     09/22/18 1800   09/22/18 1800  CBC  Once,   STAT     09/22/18 1800   Signed and Held  CBC  (enoxaparin (LOVENOX)    CrCl >/= 30 ml/min)  Once,   R    Comments: Baseline for enoxaparin therapy IF NOT ALREADY DRAWN.  Notify MD if PLT < 100 K.    Signed and Held   Signed and Held  Creatinine, serum  (enoxaparin (LOVENOX)    CrCl >/= 30  ml/min)  Once,   R    Comments: Baseline for enoxaparin therapy IF NOT ALREADY DRAWN.    Signed and Held   Signed and Held  Creatinine, serum  (enoxaparin (LOVENOX)    CrCl >/= 30 ml/min)  Weekly,   R    Comments: while on enoxaparin therapy    Signed and Held   Signed and Held  CBC  Tomorrow morning,   R     Signed and Held   Signed and Held  Basic metabolic panel  Tomorrow morning,   R     Signed and Held          Vitals/Pain Today's Vitals   09/22/18 1937 09/22/18 1945 09/22/18 2100 09/22/18 2200  BP:    (!) 155/86  Pulse:   70 82  Resp:   19 19  Temp: 99.8 F (37.7 C) 98.9 F (37.2 C)    TempSrc: Oral Oral    SpO2:   97% 97%  Weight:    163 lb 2.3 oz (74 kg)  PainSc:        Isolation Precautions No active isolations  Medications Medications  acetaminophen (OFIRMEV) IV 1,000 mg (0 mg Intravenous Stopped 09/22/18 1936)  ceFEPIme (MAXIPIME) 2 g in sodium chloride 0.9 % 100 mL IVPB (0 g Intravenous Stopped 09/22/18 1938)  metroNIDAZOLE (FLAGYL) IVPB 500 mg (0 mg Intravenous Stopped 09/22/18 1942)  vancomycin (VANCOCIN) IVPB 1000 mg/200 mL premix (0 mg Intravenous Stopped 09/22/18 2036)  sodium chloride 0.9 % bolus 1,000 mL (0 mLs Intravenous Stopped 09/22/18 2213)  LORazepam (ATIVAN) injection 0.5 mg (1 mg Intravenous Given 09/22/18 2124)  LORazepam (ATIVAN) injection 0.5 mg (0.5 mg Intravenous Given 09/22/18 2135)    Mobility walks Moderate fall risk   Focused Assessments Cardiac Assessment Handoff:    Lab Results  Component Value Date   CKTOTAL 233 03/27/2018   CKMB 2.7 05/27/2014   TROPONINI <0.03 10/05/2017   No results found for: DDIMER Does the Patient currently have chest pain? No     R Recommendations: See Admitting Provider Note  Report given to:   Additional Notes: none

## 2018-09-22 NOTE — ED Notes (Signed)
Iv med infusing.   Pt more alert.  nsr on monitor.  Skin hot to touch.

## 2018-09-22 NOTE — ED Notes (Signed)
ED Provider at bedside. 

## 2018-09-22 NOTE — ED Triage Notes (Signed)
Pt brought in via ems from home with seizures.  Pt postictal on arrival to ER.  Ems gave 2mg  versed. sierails up x 2.

## 2018-09-22 NOTE — ED Notes (Signed)
Bed posey on patient.  Pt still postictal.

## 2018-09-22 NOTE — ED Notes (Signed)
md unsuccessful doing LP.  Pt wanted procedure stopped also.

## 2018-09-22 NOTE — ED Notes (Signed)
Family in with pt 

## 2018-09-22 NOTE — H&P (Signed)
[facility] NAME: Sandra Charles    MR#:  [id number]  DATE OF BIRTH:  09-Dec-1955  DATE OF ADMISSION:  09/22/2018  PRIMARY CARE PHYSICIAN: Minda Ditto, MD   REQUESTING/REFERRING PHYSICIAN: Dr. Jari Pigg  CHIEF COMPLAINT:  seizures at home  HISTORY OF PRESENT ILLNESS:  Sandra Charles  is a 63 y.o. female with a known history of hypertension, hyperlipidemia, seizures, history of many strokes comes to the emergency room after she had experienced seizures at home and was witnessed by EMS as well. Patient was taken of [place] and started recently on Lamictal in February 2020. According to the ER physician she potentially had three seizures. She received IM were said by EMS. She was postictal upon arrival to the ER. She was found to have fever and urine was abnormal. Given her symptoms patient underwent attempt four LP times two by ER physician however patient was very uncomfortable and restless and was not able to attempt. She received IV vancomycin IV Flagyl and cefepime. Patient is being admitted with sepsis most likely due to UTI. Her lactic acid was 5.6   PAST MEDICAL HISTORY:   Past Medical History:  Diagnosis Date  . Anxiety   . Arthritis    "all over"  . Chronic lower back pain   . Depression   . GERD (gastroesophageal reflux disease)   . Headache    "weekly" (07/15/2015)  . Hyperlipidemia   . Hypertension   . Mini stroke ([place])    "several since 05/2014" (07/15/2015)  . Seizures ([place]) dx'd 04/2015    PAST SURGICAL HISTOIRY:   Past Surgical History:  Procedure Laterality Date  . DILATION AND CURETTAGE OF UTERUS    . LUMBAR PUNCTURE  07/14/2015  . TUBAL LIGATION    . VAGINAL HYSTERECTOMY      SOCIAL HISTORY:   Social History   Tobacco Use  . Smoking status: Former Smoker    Packs/day: 1.00    Years: 5.00    Pack years: 5.00    Types: Cigarettes  . Smokeless tobacco: Never Used  . Tobacco comment: "quit smoking  cigarettes in the early 2000s"  Substance Use Topics  . Alcohol use: Yes    Alcohol/week: 0.0 standard drinks    Comment: 07/15/2015 "nothing in the last few years"    FAMILY HISTORY:   Family History  Problem Relation Age of Onset  . Hyperlipidemia Mother   . Breast cancer Mother 65  . Hypertension Mother   . Heart attack Mother        age 63's  . Hyperlipidemia Father   . Hypertension Father   . Heart attack Father 18  . Breast cancer Sister 56    DRUG ALLERGIES:   Allergies  Allergen Reactions  . Lovastatin Other (See Comments)    Unknown reaction    REVIEW OF SYSTEMS:  Review of Systems  Unable to perform ROS: Mental status change     MEDICATIONS AT HOME:   Prior to Admission medications   Medication Sig Start Date End Date Taking? Authorizing Provider  amLODipine (NORVASC) 10 MG tablet Take 10 mg by mouth daily. For high blood pressure   Yes [provider]  aspirin EC 81 MG tablet Take 81 mg by mouth every morning.    Yes [provider]  Cholecalciferol (VITAMIN D3) 5000 UNITS TABS Take 5,000 Units by mouth every morning.    Yes [provider]  cloNIDine (CATAPRES) 0.1 MG  tablet Take 1 tablet (0.1 mg total) by mouth 2 (two) times daily. 07/15/15  Yes Wieting, Richard, MD  donepezil (ARICEPT) 10 MG tablet Take 10 mg by mouth at bedtime.   Yes [provider]  lamoTRIgine (LAMICTAL) 25 MG tablet Take 3 tablets (75 mg total) by mouth 2 (two) times daily. 03/29/18  Yes Mayo, Pete Pelt, MD  nystatin (NYSTATIN) powder Apply topically 2 (two) times daily.   Yes [provider]  pantoprazole (PROTONIX) 40 MG tablet Take 1 tablet (40 mg total) by mouth daily. Patient taking differently: Take 40 mg by mouth every morning.  11/25/14  Yes Wellington Hampshire, MD  propranolol ER (INDERAL LA) 60 MG 24 hr capsule Take 60 mg by mouth daily. 09/15/17  Yes [provider]  QUEtiapine (SEROQUEL XR) 50 MG TB24 24 hr tablet Take 50 mg  by mouth at bedtime.   Yes [provider]  rosuvastatin (CRESTOR) 20 MG tablet Take 20 mg by mouth at bedtime. For high cholesterol   Yes [provider]  sertraline (ZOLOFT) 100 MG tablet Take 100 mg by mouth daily. 08/30/17  Yes [provider]  vitamin B-12 (CYANOCOBALAMIN) 1000 MCG tablet Take 1,000 mcg by mouth daily.   Yes [provider]      VITAL SIGNS:  Blood pressure (!) 155/86, pulse 77, temperature 98.9 F (37.2 C), temperature source Oral, resp. rate 18, weight 74 kg, SpO2 97 %.  PHYSICAL EXAMINATION:  GENERAL:  63 y.o.-year-old patient lying in the bed with no acute distress.  EYES: Pupils equal, round, reactive to light and accommodation. No scleral icterus. Extraocular muscles intact.  HEENT: Head atraumatic, normocephalic. Oropharynx and nasopharynx clear.  NECK:  Supple, no jugular venous distention. No thyroid enlargement, no tenderness.  LUNGS: Normal breath sounds bilaterally, no wheezing, rales,rhonchi or crepitation. No use of accessory muscles of respiration.  CARDIOVASCULAR: S1, S2 normal. No murmurs, rubs, or gallops.  ABDOMEN: Soft, nontender, nondistended. Bowel sounds present. No organomegaly or mass.  EXTREMITIES: No pedal edema, cyanosis, or clubbing.  NEUROLOGIC: Cranial nerves II through XII are intact. Muscle strength 5/5 in all extremities. Sensation intact. Gait not checked.  PSYCHIATRIC: The patient is alert and oriented x 3.  SKIN: No obvious rash, lesion, or ulcer.   LABORATORY PANEL:   CBC Recent Labs  Lab 09/22/18 1800  WBC 14.1*  HGB 13.3  HCT 43.4  PLT 231   ------------------------------------------------------------------------------------------------------------------  Chemistries  Recent Labs  Lab 09/22/18 1800  NA 138  K 4.3  CL 100  CO2 22  GLUCOSE 135*  BUN 12  CREATININE 1.05*  CALCIUM 9.9  AST 34  ALT 29  ALKPHOS 106  BILITOT 0.6    ------------------------------------------------------------------------------------------------------------------  Cardiac Enzymes No results for input(s): TROPONINI in the last 168 hours. ------------------------------------------------------------------------------------------------------------------  RADIOLOGY:  Ct Head Wo Contrast  Result Date: 09/22/2018 CLINICAL DATA:  Seizure EXAM: CT HEAD WITHOUT CONTRAST TECHNIQUE: Contiguous axial images were obtained from the base of the skull through the vertex without intravenous contrast. COMPARISON:  CT head 03/27/2018.  MRI 03/28/2018 FINDINGS: Brain: Ventricle size normal. Patchy bilateral white matter disease unchanged from prior studies. Negative for acute infarct, hemorrhage, mass. No fluid collection or midline shift. Vascular: Negative for hyperdense vessel Skull: Negative Sinuses/Orbits: Negative Other: None IMPRESSION: No acute abnormality. Chronic white matter changes stable from prior studies Electronically Signed   By: Franchot Gallo M.D.   On: 09/22/2018 19:11   Dg Chest Portable 1 View  Result Date: 09/22/2018  CLINICAL DATA:  Pt brought in via ems from home with a seizure. Ems report 3rd seizure of the day. Pt postictal on arrival . Seizure pads in place. md at bedside . Iv in place. Pt on 2 liters oxygen. Ems report pt is taking her seizure meds per family. EXAM: PORTABLE CHEST - 1 VIEW COMPARISON:  10/05/2017 FINDINGS: Lungs clear. Heart size and mediastinal contours are within normal limits. No effusion. Visualized bones unremarkable. IMPRESSION: No acute cardiopulmonary disease. Electronically Signed   By: Lucrezia Europe M.D.   On: 09/22/2018 19:20    EKG:   Tachy-on monitor IMPRESSION AND PLAN:   Sandra Charles  is a 63 y.o. female with a known history of hypertension, hyperlipidemia, seizures, history of many strokes comes to the emergency room after she had experienced seizures at home and was witnessed by EMS as well.  1.  Breakthrough seizures with fever likely in the setting of sepsis with UTI -admit to medical floor -seizure precautions, IV PRN Ativan -PO Lamictal -neurology consultation with Dr. Irish Elders--- message sent via haiku -CT head no acute abnormality -attempt was made to do LP times two by ER physician--- unsuccessful -clinically does not seem to of meningitis however will await neurological evaluation   2. Sepsis suspected due to UTI -IV cefepime and vanc-- pharmacy to dose-- de-escalate antibiotics according to the cultures -follow-up urine culture blood culture -follow-up lactic acid -IV fluids -patient is COVID-19 negative  3. Hypertension resume home meds  4. Hyperlipidemia resume home meds  5. DVT prophylaxis subcu Lovenox    All the records are reviewed and case discussed with ED provider.   CODE STATUS: full  TOTAL TIME TAKING CARE OF THIS PATIENT: 50 minutes.    Fritzi Mandes M.D on 09/22/2018 at 11:04 PM  Between 7am to 6pm - Pager - [phone]  After 6pm go to [email or web address] - password [email or web address] [facility]  [facility]  Office  [phone]  CC: Primary care physician; Minda Ditto, MD

## 2018-09-22 NOTE — ED Notes (Signed)
Pt brought in via ems from home with a seizure.  Ems report 3rd seizure of the day.  Pt postictal on arrival . Seizure pads in place.  md at bedside . Iv in place. Pt on 2 liters oxygen.  Ems report pt is taking her seizure meds per family.

## 2018-09-22 NOTE — ED Provider Notes (Signed)
[facility] Emergency Department Provider Note  ____________________________________________   First MD Initiated Contact with Patient 09/22/18 1758     (approximate)  I have reviewed the triage vital signs and the nursing notes.   HISTORY  Chief Complaint Seizures    HPI Sandra Charles is a 63 y.o. female with hypertension, hyperlipidemia, seizures who presents with seizures.  Patient has admission in February 2020 where she had altered mental status secondary to seizure.  Patient was taken off the [place] and started on Lamictal.  Patient had potentially 3 seizures today.  EMS witnessed a seizure and was given 2 of IM Versed with resolution of seizure.  Patient was postictal upon arrival to the ER.  Unable to get full history due to altered mental status.     Past Medical History:  Diagnosis Date   Anxiety    Arthritis    "all over"   Chronic lower back pain    Depression    GERD (gastroesophageal reflux disease)    Headache    "weekly" (07/15/2015)   Hyperlipidemia    Hypertension    Mini stroke ([place])    "several since 05/2014" (07/15/2015)   Seizures ([place]) dx'd 04/2015    Patient Active Problem List   Diagnosis Date Noted   Altered mental status 03/27/2018   HLD (hyperlipidemia) 10/05/2017   Seizure ([place])    UTI (urinary tract infection) 08/17/2015   Seizure disorder ([place]) 08/10/2015   Cannabis use disorder, moderate, dependence ([place]) 08/10/2015   Schizoaffective disorder, bipolar type ([place]) 08/10/2015   Abnormal finding on MRI of brain    Hypokalemia 07/15/2015   Confusion    GERD (gastroesophageal reflux disease) 11/25/2014   Essential hypertension 11/25/2014    Past Surgical History:  Procedure Laterality Date   DILATION AND CURETTAGE OF UTERUS     LUMBAR PUNCTURE  07/14/2015   TUBAL LIGATION     VAGINAL HYSTERECTOMY      Prior to Admission medications   Medication Sig Start Date End Date Taking?  Authorizing Provider  amLODipine (NORVASC) 10 MG tablet Take 10 mg by mouth daily. For high blood pressure    [provider]  aspirin EC 81 MG tablet Take 81 mg by mouth every morning.     [provider]  Cholecalciferol (VITAMIN D3) 5000 UNITS TABS Take 5,000 Units by mouth every morning.     [provider]  cloNIDine (CATAPRES) 0.1 MG tablet Take 1 tablet (0.1 mg total) by mouth 2 (two) times daily. 07/15/15   Loletha Grayer, MD  donepezil (ARICEPT) 10 MG tablet Take 10 mg by mouth at bedtime.    [provider]  lamoTRIgine (LAMICTAL) 25 MG tablet Take 3 tablets (75 mg total) by mouth 2 (two) times daily. 03/29/18   Mayo, Pete Pelt, MD  nystatin (NYSTATIN) powder Apply topically 2 (two) times daily.    [provider]  pantoprazole (PROTONIX) 40 MG tablet Take 1 tablet (40 mg total) by mouth daily. Patient taking differently: Take 40 mg by mouth every morning.  11/25/14   Wellington Hampshire, MD  propranolol ER (INDERAL LA) 60 MG 24 hr capsule Take 60 mg by mouth daily. 09/15/17   [provider]  QUEtiapine (SEROQUEL XR) 50 MG TB24 24 hr tablet Take 50 mg by mouth at bedtime.    [provider]  rosuvastatin (CRESTOR) 20 MG tablet Take 20 mg by mouth at bedtime. For high cholesterol    [provider]  sertraline (ZOLOFT) 100 MG tablet Take 100 mg by mouth daily. 08/30/17   [provider]  vitamin B-12 (CYANOCOBALAMIN) 1000 MCG tablet Take 1,000 mcg by mouth daily.    [provider]    Allergies Lovastatin  Family History  Problem Relation Age of Onset   Hyperlipidemia Mother    Breast cancer Mother 64   Hypertension Mother    Heart attack Mother        age 60's   Hyperlipidemia Father    Hypertension Father    Heart attack Father 62   Breast cancer Sister 101    Social History Social History   Tobacco Use   Smoking status: Former Smoker    Packs/day: 1.00    Years: 5.00    Pack  years: 5.00    Types: Cigarettes   Smokeless tobacco: Never Used   Tobacco comment: "quit smoking cigarettes in the early 2000s"  Substance Use Topics   Alcohol use: Yes    Alcohol/week: 0.0 standard drinks    Comment: 07/15/2015 "nothing in the last few years"   Drug use: No      Review of Systems Unable to get full review of system due to patient's altered mental status status post seizure. ____________________________________________   PHYSICAL EXAM:  VITAL SIGNS: Blood pressure 125/67, pulse 71, temperature (!) 100.9 F (38.3 C), temperature source Oral, resp. rate 20, SpO2 96 %.  Constitutional: Patient responds with her name but otherwise confused Eyes: Conjunctivae are normal. EOMI. Head: Atraumatic. Nose: No congestion/rhinnorhea. Mouth/Throat: Mucous membranes are moist.   Neck: No stridor. Trachea Midline. FROM Cardiovascular: Normal rate, regular rhythm. Grossly normal heart sounds.  Good peripheral circulation. Respiratory: Normal respiratory effort.  No retractions. Lungs CTAB. Gastrointestinal: Soft and nontender. No distention. No abdominal bruits.  Musculoskeletal: No lower extremity tenderness nor edema.  No joint effusions. Neurologic: Alert and oriented x1, moving all extremities Skin:  Skin is warm, dry and intact. No rash noted. Psychiatric: Unable to fully assess due to patient's altered mental status from the seizure GU: Deferred   ____________________________________________   LABS (all labs ordered are listed, but only abnormal results are displayed)  Labs Reviewed  COMPREHENSIVE METABOLIC PANEL - Abnormal; Notable for the following components:      Result Value   Glucose, Bld 135 (*)    Creatinine, Ser 1.05 (*)    Total Protein 8.8 (*)    GFR calc non Af Amer 58 (*)    Anion gap 16 (*)    All other components within normal limits  CBC WITH DIFFERENTIAL/PLATELET - Abnormal; Notable for the following components:   WBC 14.1 (*)    RBC  5.15 (*)    MCH 25.8 (*)    RDW 15.8 (*)    Neutro Abs 12.5 (*)    All other components within normal limits  LACTIC ACID, PLASMA - Abnormal; Notable for the following components:   Lactic Acid, Venous 5.6 (*)    All other components within normal limits  SARS CORONAVIRUS 2 (HOSPITAL ORDER, [place] [facility])  CULTURE, BLOOD (ROUTINE X 2)  CULTURE, BLOOD (ROUTINE X 2)  URINE CULTURE  CBC  URINALYSIS, ROUTINE W REFLEX MICROSCOPIC  URINE DRUG SCREEN, QUALITATIVE ([facility] ONLY)  LAMOTRIGINE LEVEL  LACTIC ACID, PLASMA  PROTIME-INR  APTT   ____________________________________________   ED ECG REPORT I, Vanessa , the attending physician, personally viewed and interpreted this ECG.  EKG is normal sinus rate 79, no ST elevation, no  T wave inversions except for in lead aVL, normal intervals ____________________________________________  RADIOLOGY Robert Bellow, personally viewed and evaluated these images (plain radiographs) as part of my medical decision making, as well as reviewing the written report by the radiologist.  ED MD interpretation: Chest x-ray negative for pneumonia  Official radiology report(s): Ct Head Wo Contrast  Result Date: 09/22/2018 CLINICAL DATA:  Seizure EXAM: CT HEAD WITHOUT CONTRAST TECHNIQUE: Contiguous axial images were obtained from the base of the skull through the vertex without intravenous contrast. COMPARISON:  CT head 03/27/2018.  MRI 03/28/2018 FINDINGS: Brain: Ventricle size normal. Patchy bilateral white matter disease unchanged from prior studies. Negative for acute infarct, hemorrhage, mass. No fluid collection or midline shift. Vascular: Negative for hyperdense vessel Skull: Negative Sinuses/Orbits: Negative Other: None IMPRESSION: No acute abnormality. Chronic white matter changes stable from prior studies Electronically Signed   By: Franchot Gallo M.D.   On: 09/22/2018 19:11   Dg Chest Portable 1 View  Result Date:  09/22/2018 CLINICAL DATA:  Pt brought in via ems from home with a seizure. Ems report 3rd seizure of the day. Pt postictal on arrival . Seizure pads in place. md at bedside . Iv in place. Pt on 2 liters oxygen. Ems report pt is taking her seizure meds per family. EXAM: PORTABLE CHEST - 1 VIEW COMPARISON:  10/05/2017 FINDINGS: Lungs clear. Heart size and mediastinal contours are within normal limits. No effusion. Visualized bones unremarkable. IMPRESSION: No acute cardiopulmonary disease. Electronically Signed   By: Lucrezia Europe M.D.   On: 09/22/2018 19:20    ____________________________________________   PROCEDURES  Procedure(s) performed (including Critical Care):  .Critical Care Performed by: Vanessa [place], MD Authorized by: Vanessa Crockett, MD   Critical care provider statement:    Critical care time (minutes):  45   Critical care was necessary to treat or prevent imminent or life-threatening deterioration of the following conditions:  Sepsis   Critical care was time spent personally by me on the following activities:  Discussions with consultants, evaluation of patient's response to treatment, examination of patient, ordering and performing treatments and interventions, ordering and review of laboratory studies, ordering and review of radiographic studies, pulse oximetry, re-evaluation of patient's condition, obtaining history from patient or surrogate and review of old charts  .Lumbar Puncture  Date/Time: 09/22/2018 9:35 PM Performed by: Vanessa Emmet, MD Authorized by: Vanessa Dyer, MD   Pre-procedure details:    Procedure purpose:  Diagnostic Sedation:    Sedation type:  Anxiolysis Procedure details:    Lumbar space:  L4-L5 interspace   Patient position:  R lateral decubitus   Needle gauge:  20   Needle type:  Spinal needle - Quincke tip   Needle length (in):  2.5   Number of attempts:  2 Comments:     Unable to complete due to patient request.       ____________________________________________   INITIAL IMPRESSION / ASSESSMENT AND PLAN / ED COURSE  Sandra Charles was evaluated in Emergency Department on 09/22/2018 for the symptoms described in the history of present illness. She was evaluated in the context of the global COVID-19 pandemic, which necessitated consideration that the patient might be at risk for infection with the SARS-CoV-2 virus that causes COVID-19. Institutional protocols and algorithms that pertain to the evaluation of patients at risk for COVID-19 are in a state of rapid change based on information released by regulatory bodies including the CDC and federal and state organizations. These  policies and algorithms were followed during the patient's care in the ED.    Patient presents with seizures and noted to be febrile.  This is most concerning for lower seizure threshold secondary to an infection.  Will get chest x-ray to evaluate for pneumonia.  Will get urine to evaluate for UTI.  Patient not seen having abdominal tenderness to suggest abdominal infection.  Consider meningitis as well given seizures.  Will get CT head to evaluate for any bleed.   White count noted to be 14.1.  Lactate is elevated which could be secondary to the seizure however also concerning for infection.  Given patient's white count, tachycardia sepsis alert was called and patient was started on broad-spectrum antibiotics.  Patient was negative for coronavirus.  8:47 PM reevaluated patient.  Patient denies any headaches, neck stiffness, abdominal pain.  She is alert and oriented x3 at this time.  Patient said she did not have any infectious symptoms prior to the seizures today.  She is unsure how often she has seizures at baseline.  Patient does endorse some urinary frequency. D/w pt LP if UA is negative.  Patient is agreeable to LP.  Discussed the risk of LP including bleeding, infection, hitting R structures.  9:33 PM   Performed lumbar  puncture x2 attempts.  Patient given 1 mg of Ativan but still moving all around.  Patient is yelling at [place] telling us to [place] to stop so further attempts stopped.  At this time we will hold off on additional attempts given patient's body habitus, moving all around and feels this is not safe in nature.  Will discuss with the hospital team for admission.  Patient is already being covered for meningitis.  Discussed with hospital team they will admit patient.       ____________________________________________   FINAL CLINICAL IMPRESSION(S) / ED DIAGNOSES   Final diagnoses:  Seizure ([place] [facility] [place])  Fever, unspecified fever cause      MEDICATIONS GIVEN DURING THIS VISIT:  Medications  acetaminophen (OFIRMEV) IV 1,000 mg (0 mg Intravenous Stopped 09/22/18 1936)  LORazepam (ATIVAN) injection 0.5 mg (has no administration in time range)  ceFEPIme (MAXIPIME) 2 g in sodium chloride 0.9 % 100 mL IVPB (0 g Intravenous Stopped 09/22/18 1938)  metroNIDAZOLE (FLAGYL) IVPB 500 mg (0 mg Intravenous Stopped 09/22/18 1942)  vancomycin (VANCOCIN) IVPB 1000 mg/200 mL premix (0 mg Intravenous Stopped 09/22/18 2036)  sodium chloride 0.9 % bolus 1,000 mL (1,000 mLs Intravenous New Bag/Given 09/22/18 1942)  LORazepam (ATIVAN) injection 0.5 mg (1 mg Intravenous Given 09/22/18 2124)     ED Discharge Orders    None       Note:  This document was prepared using Dragon voice recognition software and may include unintentional dictation errors.   Vanessa [place], MD 09/22/18 2138

## 2018-09-22 NOTE — ED Notes (Signed)
Pt unable to sign for consent for LP.  Verbal consent given to this nurse and dr Jari Pigg.

## 2018-09-22 NOTE — ED Notes (Signed)
Report to edna rn floor nurse

## 2018-09-22 NOTE — ED Notes (Signed)
Pt voided, sheets were changed, pt was repositioned in the bed.

## 2018-09-22 NOTE — Progress Notes (Signed)
PHARMACY -  BRIEF ANTIBIOTIC NOTE   Pharmacy has received consult(s) for Cefepime and Vancomycin from an ED provider.  The patient's profile has been reviewed for ht/wt/allergies/indication/available labs.    Per nurse, unable to retrieve current weight. Previous weight recorded was greater than 6 months ago. Patient unable to speak and current hospital bed that patient is in does not calculate weight. Will continue to treat with empirical order set.  One time order(s) placed for Vancomycin 1g IV and Cefepime 2g IV.  Further antibiotics/pharmacy consults should be ordered by admitting physician if indicated.                       Thank you, Pearla Dubonnet 09/22/2018  8:10 PM

## 2018-09-22 NOTE — Progress Notes (Signed)
Family Meeting Note  Advance Directive:not sure  Today a meeting took place with the patient.  Patient came in with breakthrough seizure. She is being admitted with sepsis suspected due to UTI. She had three seizures today. LP was attempted. She received earlier IV Ativan and worsened. She is a little bit sleepy or try to discuss code status with her not get a clear-cut answer. I will leave her a full code according to her par hold history and discuss with family or patient when she is more awake. Time spent 16 minutes Fritzi Mandes, MD

## 2018-09-22 NOTE — Progress Notes (Signed)
CODE SEPSIS - PHARMACY COMMUNICATION  **Broad Spectrum Antibiotics should be administered within 1 hour of Sepsis diagnosis**  Time Code Sepsis Called/Page Received: 1854  Antibiotics Ordered: cefepime,vancomycin,flagyl  Time of 1st antibiotic administration: 1901  Additional action taken by pharmacy: none  If necessary, Name of Provider/Nurse Contacted: N/A    Pearla Dubonnet ,PharmD Clinical Pharmacist  09/22/2018  7:25 PM

## 2018-09-23 ENCOUNTER — Other Ambulatory Visit: Payer: Self-pay

## 2018-09-23 DIAGNOSIS — R569 Unspecified convulsions: Secondary | ICD-10-CM

## 2018-09-23 LAB — CBC WITH DIFFERENTIAL/PLATELET
Abs Immature Granulocytes: 0.03 10*3/uL (ref 0.00–0.07)
Basophils Absolute: 0 10*3/uL (ref 0.0–0.1)
Basophils Relative: 0 %
Eosinophils Absolute: 0 10*3/uL (ref 0.0–0.5)
Eosinophils Relative: 0 %
HCT: 36 % (ref 36.0–46.0)
Hemoglobin: 11.5 g/dL — ABNORMAL LOW (ref 12.0–15.0)
Immature Granulocytes: 0 %
Lymphocytes Relative: 13 %
Lymphs Abs: 1.5 10*3/uL (ref 0.7–4.0)
MCH: 26 pg (ref 26.0–34.0)
MCHC: 31.9 g/dL (ref 30.0–36.0)
MCV: 81.3 fL (ref 80.0–100.0)
Monocytes Absolute: 0.7 10*3/uL (ref 0.1–1.0)
Monocytes Relative: 6 %
Neutro Abs: 9.1 10*3/uL — ABNORMAL HIGH (ref 1.7–7.7)
Neutrophils Relative %: 81 %
Platelets: 194 10*3/uL (ref 150–400)
RBC: 4.43 MIL/uL (ref 3.87–5.11)
RDW: 15.8 % — ABNORMAL HIGH (ref 11.5–15.5)
WBC: 11.5 10*3/uL — ABNORMAL HIGH (ref 4.0–10.5)
nRBC: 0 % (ref 0.0–0.2)

## 2018-09-23 LAB — CBC
HCT: 35.8 % — ABNORMAL LOW (ref 36.0–46.0)
Hemoglobin: 11.3 g/dL — ABNORMAL LOW (ref 12.0–15.0)
MCH: 26.1 pg (ref 26.0–34.0)
MCHC: 31.6 g/dL (ref 30.0–36.0)
MCV: 82.7 fL (ref 80.0–100.0)
Platelets: 179 10*3/uL (ref 150–400)
RBC: 4.33 MIL/uL (ref 3.87–5.11)
RDW: 15.8 % — ABNORMAL HIGH (ref 11.5–15.5)
WBC: 10.6 10*3/uL — ABNORMAL HIGH (ref 4.0–10.5)
nRBC: 0 % (ref 0.0–0.2)

## 2018-09-23 LAB — BASIC METABOLIC PANEL
Anion gap: 9 (ref 5–15)
BUN: 12 mg/dL (ref 6–20)
CO2: 25 mmol/L (ref 22–32)
Calcium: 8.9 mg/dL (ref 8.9–10.3)
Chloride: 103 mmol/L (ref 98–111)
Creatinine, Ser: 0.87 mg/dL (ref 0.44–1.00)
GFR calc Af Amer: >60 mL/min (ref >60)
GFR calc non Af Amer: >60 mL/min (ref >60)
Glucose, Bld: 98 mg/dL (ref 70–99)
Potassium: 3.7 mmol/L (ref 3.5–5.1)
Sodium: 137 mmol/L (ref 135–145)

## 2018-09-23 LAB — URINE CULTURE: Culture: NO GROWTH

## 2018-09-23 LAB — PROTIME-INR
INR: 1.1 (ref 0.8–1.2)
Prothrombin Time: 13.6 seconds (ref 11.4–15.2)

## 2018-09-23 LAB — CREATININE, SERUM
Creatinine, Ser: 0.85 mg/dL (ref 0.44–1.00)
GFR calc Af Amer: >60 mL/min (ref >60)
GFR calc non Af Amer: >60 mL/min (ref >60)

## 2018-09-23 LAB — LACTIC ACID, PLASMA: Lactic Acid, Venous: 0.8 mmol/L (ref 0.5–1.9)

## 2018-09-23 LAB — APTT: aPTT: 26 seconds (ref 24–36)

## 2018-09-23 MED ORDER — SODIUM CHLORIDE 0.9 % IV SOLN
2.0000 g | Freq: Three times a day (TID) | INTRAVENOUS | Status: DC
  Administered 2018-09-23 – 2018-09-24 (×4): 2 g via INTRAVENOUS
  Filled 2018-09-23 (×6): qty 2

## 2018-09-23 MED ORDER — VANCOMYCIN HCL IN DEXTROSE 750-5 MG/150ML-% IV SOLN
750.0000 mg | INTRAVENOUS | Status: DC
  Administered 2018-09-23: 750 mg via INTRAVENOUS
  Filled 2018-09-23 (×2): qty 150

## 2018-09-23 MED ORDER — LEVETIRACETAM 500 MG PO TABS
500.0000 mg | ORAL_TABLET | Freq: Two times a day (BID) | ORAL | Status: DC
  Administered 2018-09-23 – 2018-09-25 (×5): 500 mg via ORAL
  Filled 2018-09-23 (×5): qty 1

## 2018-09-23 NOTE — Progress Notes (Signed)
Patient noted to have some difficulty swallowing when attempting to swallow pills this am.  Dr. Bridgett Larsson notified and swallow eval ordered.

## 2018-09-23 NOTE — Progress Notes (Signed)
[facility] [place] NAME: Sandra Charles    MR#:  [id number]  DATE OF BIRTH:  01-13-1956  SUBJECTIVE:  CHIEF COMPLAINT:   Chief Complaint  Patient presents with  . Seizures   The patient is confused.  No active seizure after admission. REVIEW OF SYSTEMS:  Review of Systems  Unable to perform ROS: Medical condition    DRUG ALLERGIES:   Allergies  Allergen Reactions  . Lovastatin Other (See Comments)    Unknown reaction   VITALS:  Blood pressure 126/60, pulse 64, temperature 97.9 F (36.6 C), temperature source Oral, resp. rate 18, height 5' (1.524 m), weight 73.8 kg, SpO2 97 %. PHYSICAL EXAMINATION:  Physical Exam Constitutional:      General: She is not in acute distress. HENT:     Head: Normocephalic.     Mouth/Throat:     Mouth: Mucous membranes are moist.  Eyes:     General: No scleral icterus.    Conjunctiva/sclera: Conjunctivae normal.     Pupils: Pupils are equal, round, and reactive to light.  Neck:     Musculoskeletal: Normal range of motion and neck supple.     Vascular: No JVD.     Trachea: No tracheal deviation.  Cardiovascular:     Rate and Rhythm: Normal rate and regular rhythm.     Heart sounds: Normal heart sounds. No murmur. No gallop.   Pulmonary:     Effort: Pulmonary effort is normal. No respiratory distress.     Breath sounds: Normal breath sounds. No wheezing or rales.  Abdominal:     General: Bowel sounds are normal. There is no distension.     Palpations: Abdomen is soft.     Tenderness: There is no abdominal tenderness. There is no rebound.  Musculoskeletal: Normal range of motion.        General: No tenderness.     Right lower leg: No edema.     Left lower leg: No edema.  Skin:    Findings: No erythema or rash.  Neurological:     General: No focal deficit present.     Mental Status: She is alert.     Cranial Nerves: No cranial nerve deficit.  Psychiatric:     Comments: Confused.     LABORATORY PANEL:  Female CBC Recent Labs  Lab 09/23/18 0203  WBC 10.6*  HGB 11.3*  HCT 35.8*  PLT 179   ------------------------------------------------------------------------------------------------------------------ Chemistries  Recent Labs  Lab 09/22/18 1800  09/23/18 0203  NA 138  --  137  K 4.3  --  3.7  CL 100  --  103  CO2 22  --  25  GLUCOSE 135*  --  98  BUN 12  --  12  CREATININE 1.05*   < > 0.87  CALCIUM 9.9  --  8.9  AST 34  --   --   ALT 29  --   --   ALKPHOS 106  --   --   BILITOT 0.6  --   --    < > = values in this interval not displayed.   RADIOLOGY:  Ct Head Wo Contrast  Result Date: 09/22/2018 CLINICAL DATA:  Seizure EXAM: CT HEAD WITHOUT CONTRAST TECHNIQUE: Contiguous axial images were obtained from the base of the skull through the vertex without intravenous contrast. COMPARISON:  CT head 03/27/2018.  MRI 03/28/2018 FINDINGS: Brain: Ventricle size normal. Patchy bilateral white matter disease unchanged from prior studies. Negative for  acute infarct, hemorrhage, mass. No fluid collection or midline shift. Vascular: Negative for hyperdense vessel Skull: Negative Sinuses/Orbits: Negative Other: None IMPRESSION: No acute abnormality. Chronic white matter changes stable from prior studies Electronically Signed   By: Franchot Gallo M.D.   On: 09/22/2018 19:11   Dg Chest Portable 1 View  Result Date: 09/22/2018 CLINICAL DATA:  Pt brought in via ems from home with a seizure. Ems report 3rd seizure of the day. Pt postictal on arrival . Seizure pads in place. md at bedside . Iv in place. Pt on 2 liters oxygen. Ems report pt is taking her seizure meds per family. EXAM: PORTABLE CHEST - 1 VIEW COMPARISON:  10/05/2017 FINDINGS: Lungs clear. Heart size and mediastinal contours are within normal limits. No effusion. Visualized bones unremarkable. IMPRESSION: No acute cardiopulmonary disease. Electronically Signed   By: Lucrezia Europe M.D.   On: 09/22/2018 19:20   ASSESSMENT  AND PLAN:   Sandra Charles  is a 63 y.o. female with a known history of hypertension, hyperlipidemia, seizures, history of many strokes comes to the emergency room after she had experienced seizures at home and was witnessed by EMS as well.  1. Breakthrough seizures with fever likely in the setting of sepsis with UTI -admit to medical floor -seizure precautions, IV PRN Ativan Continue Keppra and Lamictal -CT head no acute abnormality -attempt was made to do LP times two by ER physician--- unsuccessful -Likely meningitis per Dr. Irish Elders.   2. Sepsis suspected due to UTI -IV cefepime and vanc-- pharmacy to dose-- de-escalate antibiotics according to the cultures -follow-up urine culture blood culture -follow-up lactic acid -IV fluids -patient is COVID-19 negative  Lactic acidosis, improved with above treatment.  3. Hypertension resume home meds  4. Hyperlipidemia resume home meds  Discussed with Dr. Irish Elders. All the records are reviewed and case discussed with Care Management/Social Worker. Management plans discussed with the patient, her son and they are in agreement.  CODE STATUS: Full Code  TOTAL TIME TAKING CARE OF THIS PATIENT: 28 minutes.   More than 50% of the time was spent in counseling/coordination of care: YES  POSSIBLE D/C IN 2 DAYS, DEPENDING ON CLINICAL CONDITION.   Demetrios Loll M.D on 09/23/2018 at 3:08 PM  Between 7am to 6pm - Pager - [phone]  After 6pm go to [email or web address] - [facility]

## 2018-09-23 NOTE — Plan of Care (Signed)
  Problem: Elimination: Goal: Will not experience complications related to bowel motility Outcome: Completed/Met

## 2018-09-23 NOTE — Evaluation (Addendum)
Clinical/Bedside Swallow Evaluation Patient Details  Name: NEKISHA MCDIARMID MRN: [id number] Date of Birth: [birth year]  Today's Date: 09/23/2018 Time: SLP Start Time (ACUTE ONLY): 1350 SLP Stop Time (ACUTE ONLY): 1450 SLP Time Calculation (min) (ACUTE ONLY): 60 min  Past Medical History:  Past Medical History:  Diagnosis Date  . Anxiety   . Arthritis    "all over"  . Chronic lower back pain   . Depression   . GERD (gastroesophageal reflux disease)   . Headache    "weekly" (07/15/2015)  . Hyperlipidemia   . Hypertension   . Mini stroke ([place])    "several since 05/2014" (07/15/2015)  . Seizures ([place]) dx'd 04/2015   Past Surgical History:  Past Surgical History:  Procedure Laterality Date  . DILATION AND CURETTAGE OF UTERUS    . LUMBAR PUNCTURE  07/14/2015  . TUBAL LIGATION    . VAGINAL HYSTERECTOMY     HPI:  Pt is a 63 y.o. female with a known history of hypertension, GERD, hyperlipidemia, seizures, history of many strokes, Psychiatric dx - Lewy body Dementia, Schizoaffective disorder - follows with Dr. Geanie Kenning, who specializes in neurology and psychiatry, and other medical issues who comes to the emergency room after she had experienced seizures at home and was witnessed by EMS as well. Patient was taken of [place] and started recently on Lamictal in February 2020. According to the ER physician, she potentially had three seizures. She was postictal upon arrival to the ER. She was found to have fever and urine was abnormal.  Patient is being admitted with sepsis most likely due to UTI.  Per chart history, schizophrenia diagnosed at age 42 presenting for follow up tremor. She has a longstanding mild hand tremor, though she developed new tremor after progressive decline in memory with hallucinations prompting hospitalization and initiation of risperidone 07/2015. Tremors seemed to worsen when starting risperidone, then olanzapine and have since improved after switching to quetiapine. Tremor  occurred at rest and with action. She feels propranolol has helped "a little." She mostly complains of lips burning (reported when seeing other physicians as well). Regarding cognition, she has waxing/waning lucidity. She seems lucid at times but other times very confused. She reports current alcohol use per Neurology note.    Assessment / Plan / Recommendation Clinical Impression  Pt appears to present w/ adequate oropharyngeal phase swallowing function w/ no overt s/s of aspiration noted during evaluation; pt's risk for aspiration is reduced when following general precautions. Overall, pt was min distracted intermittently during tasks and required few verbal cues and redirection of attention during po tasks - this could be related to recent seizures; pt also has baseline dx of Lewy body dementia, Schizoaffective disorder and is multiple medications, per chart notes. Pt's baseline Cognitive/Mental status has been intermittently declined per MD notes([facility]). After pt was positioned upright and assisted w/ feeding/drinking, she consumed trials of thin liquids via straw/cup, purees, and softened foods for easier mastication. NO overt s/s of aspiration were noted when following general aspiration precautions; no decline in vocal quality or respiratory status during/post trials. Oral phase c/b adequate bolus manipulation and mastication effort; timely A-P transfer w/ all trials w/ appropriate oral clearing post trials. No deficits noted w/ liquids via straw. OM exam grossly WFL. Pt required assistance w/ setup and feeding.  Recommend a Dysphagia level 3(Mech soft foods - meats cut) diet w/ Thin liqudis; general aspiration precautions; monitoring at meals for need of any Support and less distractions at meals. Pills  in Puree, if any difficutly swallowing w/ liquids, for safer swallowing d/t pt's Cognitive status baseline(Dementia, as well as Psychiatric dx).  SLP Visit Diagnosis: Dysphagia, unspecified  (R13.10)    Aspiration Risk  (reduced following general precautions)    Diet Recommendation  Dysphagia level 3 (mech soft foods w/ meats cut w/ gravy added), Thin liquids. General aspiration and REFLUX precautions. Assistance at meals as needed; setup support.  Medication Administration: Whole meds with liquid(but whole in Puree if needed )    Other  Recommendations Recommended Consults: (Dietician f/u) Oral Care Recommendations: Oral care BID;Staff/trained caregiver to provide oral care Other Recommendations: (n/a)   Follow up Recommendations None      Frequency and Duration (n/a)  (n/a)       Prognosis Prognosis for Safe Diet Advancement: Good Barriers to Reach Goals: Cognitive deficits;Time post onset;Severity of deficits      Swallow Study   General Date of Onset: 09/22/18 HPI: Pt is a 63 y.o. female with a known history of hypertension, GERD, hyperlipidemia, seizures, history of many strokes, Psychiatric dx - Lewy body dementia, Schizoaffective disorder - follows with Dr. Geanie Kenning, who specializes in neurology and psychiatry, and other medical issues who comes to the emergency room after she had experienced seizures at home and was witnessed by EMS as well. Patient was taken of [place] and started recently on Lamictal in February 2020. According to the ER physician, she potentially had three seizures. She was postictal upon arrival to the ER. She was found to have fever and urine was abnormal.  Patient is being admitted with sepsis most likely due to UTI.  Per chart history, schizophrenia diagnosed at age 70 presenting for follow up tremor. She has a longstanding mild hand tremor, though she developed new tremor after progressive decline in memory with hallucinations prompting hospitalization and initiation of risperidone 07/2015. Tremors seemed to worsen when starting risperidone, then olanzapine and have since improved after switching to quetiapine. Tremor occurred at rest and  with action. She feels propranolol has helped "a little." She mostly complains of lips burning (reported when seeing other physicians as well). Regarding cognition, she has waxing/waning lucidity. She seems lucid at times but other times very confused.  Type of Study: Bedside Swallow Evaluation Previous Swallow Assessment: none reported Diet Prior to this Study: NPO Temperature Spikes Noted: No(wbc 10.6 down from 14.1) Respiratory Status: Room air History of Recent Intubation: No Behavior/Cognition: Alert;Cooperative;Pleasant mood;Distractible;Requires cueing(baseline LB Dementia per chart) Oral Cavity Assessment: Within Functional Limits;Dry(min) Oral Care Completed by SLP: Yes Oral Cavity - Dentition: Adequate natural dentition Vision: Functional for self-feeding Self-Feeding Abilities: Able to feed self;Needs assist;Total assist;Needs set up(waeakness - Left handed) Patient Positioning: Upright in bed(needed positioning) Baseline Vocal Quality: Normal;Low vocal intensity(min) Volitional Cough: Strong Volitional Swallow: Able to elicit    Oral/Motor/Sensory Function Overall Oral Motor/Sensory Function: Within functional limits   Ice Chips Ice chips: Within functional limits Presentation: Spoon(fed; 2 trials)   Thin Liquid Thin Liquid: Within functional limits Presentation: Cup;Self Fed;Straw(~8-10+ ozs) Other Comments: water, juices    Nectar Thick Nectar Thick Liquid: Not tested   Honey Thick Honey Thick Liquid: Not tested   Puree Puree: Within functional limits Presentation: Spoon;Self Fed(4ozs)   Solid     Solid: Within functional limits Presentation: Self Fed(8 trials)        Orinda Kenner, MS, CCC-SLP [place] 09/23/2018,2:57 PM

## 2018-09-23 NOTE — Progress Notes (Signed)
Pharmacy Antibiotic Note  Sandra Charles is a 63 y.o. female admitted on 09/22/2018 with sepsis.  Pharmacy has been consulted for vanc/cefepime dosing. Patient received vanc 1g and cefepime 2g IV x 1 in ED.  Plan: Patient did not receive a proper vanc load of 1.5g and no supplemental dose give post the 1g IV dose; therefore will start the maintenance 12 hours early, which is roughly when patient will be at a level of 15 mcg/mL.  Vd = 0.5L TBW 73.8 kg Initial concentration post vanc 1g: 27 mcg/mL Ke 0.046 hr-1 12.8 hrs to get to 15 mcg/mL.  Vancomycin 750 mg IV Q 24 hrs. Goal AUC 400-550. Expected AUC: 438.4  SCr used: 0.85 (previous was 1.05) Cssmin: 10.4  Will continue cefepime 2g IV q8h per CrCl > 60 ml/min.  Height: 5' (152.4 cm) Weight: 162 lb 9.6 oz (73.8 kg) IBW/kg (Calculated) : 45.5  Temp (24hrs), Avg:100 F (37.8 C), Min:98.9 F (37.2 C), Max:100.9 F (38.3 C)  Recent Labs  Lab 09/22/18 1800 09/22/18 1805 09/23/18 0014  WBC 14.1*  --  11.5*  CREATININE 1.05*  --  0.85  LATICACIDVEN  --  5.6* 0.8    Estimated Creatinine Clearance: 63.1 mL/min (by C-G formula based on SCr of 0.85 mg/dL).    Allergies  Allergen Reactions  . Lovastatin Other (See Comments)    Unknown reaction    Thank you for allowing pharmacy to be a part of this patient's care.  Tobie Lords, PharmD, BCPS Clinical Pharmacist 09/23/2018 1:25 AM

## 2018-09-23 NOTE — Consult Note (Signed)
Reason for Consult: seizure Referring Physician: Dr. Bridgett Larsson  CC: seizure   HPI: Sandra Charles is an 63 y.o. female with a known history of hypertension, hyperlipidemia, seizures, history of prior strokes comes to the emergency room after she had experienced seizures at home and was witnessed by EMS as well. Patient was taken of [place] and started recently on Lamictal in February 2020. As per patient states she takes her medication consistently but doe not remember the dose.  Multiple suspected seizures in ED.  Pt is suspected to be septic and started on broad spectrum antibiotics.   Past Medical History:  Diagnosis Date  . Anxiety   . Arthritis    "all over"  . Chronic lower back pain   . Depression   . GERD (gastroesophageal reflux disease)   . Headache    "weekly" (07/15/2015)  . Hyperlipidemia   . Hypertension   . Mini stroke ([place])    "several since 05/2014" (07/15/2015)  . Seizures ([place]) dx'd 04/2015    Past Surgical History:  Procedure Laterality Date  . DILATION AND CURETTAGE OF UTERUS    . LUMBAR PUNCTURE  07/14/2015  . TUBAL LIGATION    . VAGINAL HYSTERECTOMY      Family History  Problem Relation Age of Onset  . Hyperlipidemia Mother   . Breast cancer Mother 31  . Hypertension Mother   . Heart attack Mother        age 32's  . Hyperlipidemia Father   . Hypertension Father   . Heart attack Father 66  . Breast cancer Sister 77    Social History:  reports that she has quit smoking. Her smoking use included cigarettes. She has a 5.00 pack-year smoking history. She has never used smokeless tobacco. She reports current alcohol use. She reports that she does not use drugs.  Allergies  Allergen Reactions  . Lovastatin Other (See Comments)    Unknown reaction    Medications: I have reviewed the patient's current medications.  ROS: History obtained from the patient  General ROS: negative for - chills, fatigue, fever, night sweats, weight gain or weight  loss Psychological ROS: negative for - behavioral disorder, hallucinations, memory difficulties, mood swings or suicidal ideation Ophthalmic ROS: negative for - blurry vision, double vision, eye pain or loss of vision ENT ROS: negative for - epistaxis, nasal discharge, oral lesions, sore throat, tinnitus or vertigo Allergy and Immunology ROS: negative for - hives or itchy/watery eyes Hematological and Lymphatic ROS: negative for - bleeding problems, bruising or swollen lymph nodes Endocrine ROS: negative for - galactorrhea, hair pattern changes, polydipsia/polyuria or temperature intolerance Respiratory ROS: negative for - cough, hemoptysis, shortness of breath or wheezing Cardiovascular ROS: negative for - chest pain, dyspnea on exertion, edema or irregular heartbeat Gastrointestinal ROS: negative for - abdominal pain, diarrhea, hematemesis, nausea/vomiting or stool incontinence Genito-Urinary ROS: negative for - dysuria, hematuria, incontinence or urinary frequency/urgency Musculoskeletal ROS: negative for - joint swelling or muscular weakness Neurological ROS: as noted in HPI Dermatological ROS: negative for rash and skin lesion changes  Physical Examination: Blood pressure 126/60, pulse 64, temperature 97.9 F (36.6 C), temperature source Oral, resp. rate 18, height 5' (1.524 m), weight 73.8 kg, SpO2 97 %.   Neurological Examination   Mental Status: Alert, oriented, thought content appropriate, slow speech  Cranial Nerves: II: Discs flat bilaterally; Visual fields grossly normal, pupils equal, round, reactive to light and accommodation III,IV, VI: ptosis not present, extra-ocular motions intact bilaterally V,VII: smile symmetric, facial  light touch sensation normal bilaterally VIII: hearing normal bilaterally IX,X: gag reflex present XI: bilateral shoulder shrug XII: midline tongue extension Motor: Generalized weakness with 4/5 bilaterally  Tone and bulk:normal tone throughout;  no atrophy noted Sensory: Pinprick and light touch intact throughout, bilaterally Deep Tendon Reflexes: 1+ and symmetric throughout Plantars: Right: downgoing   Left: downgoing Cerebellar: normal finger-to-nose Gait: not tested     Laboratory Studies:   Basic Metabolic Panel: Recent Labs  Lab 09/22/18 1800 09/23/18 0014 09/23/18 0203  NA 138  --  137  K 4.3  --  3.7  CL 100  --  103  CO2 22  --  25  GLUCOSE 135*  --  98  BUN 12  --  12  CREATININE 1.05* 0.85 0.87  CALCIUM 9.9  --  8.9    Liver Function Tests: Recent Labs  Lab 09/22/18 1800  AST 34  ALT 29  ALKPHOS 106  BILITOT 0.6  PROT 8.8*  ALBUMIN 4.6   No results for input(s): LIPASE, AMYLASE in the last 168 hours. No results for input(s): AMMONIA in the last 168 hours.  CBC: Recent Labs  Lab 09/22/18 1800 09/23/18 0014 09/23/18 0203  WBC 14.1* 11.5* 10.6*  NEUTROABS 12.5* 9.1*  --   HGB 13.3 11.5* 11.3*  HCT 43.4 36.0 35.8*  MCV 84.3 81.3 82.7  PLT 231 194 179    Cardiac Enzymes: No results for input(s): CKTOTAL, CKMB, CKMBINDEX, TROPONINI in the last 168 hours.  BNP: Invalid input(s): POCBNP  CBG: No results for input(s): GLUCAP in the last 168 hours.  Microbiology: Results for orders placed or performed during the hospital encounter of 09/22/18  Blood culture (routine x 2)     Status: None (Preliminary result)   Collection Time: 09/22/18  6:04 PM   Specimen: BLOOD  Result Value Ref Range Status   Specimen Description BLOOD LEFT ANTECUBITAL  Final   Special Requests   Final    BOTTLES DRAWN AEROBIC AND ANAEROBIC Blood Culture adequate volume   Culture   Final    NO GROWTH < 24 HOURS Performed at [facility], [address], [place], [place] [address]    Report Status PENDING  Incomplete  SARS Coronavirus 2 (CEPHEID - Performed in [facility] hospital lab), Hosp Order     Status: None   Collection Time: 09/22/18  6:05 PM   Specimen: Nasopharyngeal Swab  Result Value Ref  Range Status   SARS Coronavirus 2 NEGATIVE NEGATIVE Final    Comment: (NOTE) If result is NEGATIVE SARS-CoV-2 target nucleic acids are NOT DETECTED. The SARS-CoV-2 RNA is generally detectable in upper and lower  respiratory specimens during the acute phase of infection. The lowest  concentration of SARS-CoV-2 viral copies this assay can detect is 250  copies / mL. A negative result does not preclude SARS-CoV-2 infection  and should not be used as the sole basis for treatment or other  patient management decisions.  A negative result may occur with  improper specimen collection / handling, submission of specimen other  than nasopharyngeal swab, presence of viral mutation(s) within the  areas targeted by this assay, and inadequate number of viral copies  (<250 copies / mL). A negative result must be combined with clinical  observations, patient history, and epidemiological information. If result is POSITIVE SARS-CoV-2 target nucleic acids are DETECTED. The SARS-CoV-2 RNA is generally detectable in upper and lower  respiratory specimens dur ing the acute phase of infection.  Positive  results  are indicative of active infection with SARS-CoV-2.  Clinical  correlation with patient history and other diagnostic information is  necessary to determine patient infection status.  Positive results do  not rule out bacterial infection or co-infection with other viruses. If result is PRESUMPTIVE POSTIVE SARS-CoV-2 nucleic acids MAY BE PRESENT.   A presumptive positive result was obtained on the submitted specimen  and confirmed on repeat testing.  While 2019 novel coronavirus  (SARS-CoV-2) nucleic acids may be present in the submitted sample  additional confirmatory testing may be necessary for epidemiological  and / or clinical management purposes  to differentiate between  SARS-CoV-2 and other Sarbecovirus currently known to infect humans.  If clinically indicated additional testing with an  alternate test  methodology [phone]) is advised. The SARS-CoV-2 RNA is generally  detectable in upper and lower respiratory sp ecimens during the acute  phase of infection. The expected result is Negative. Fact Sheet for Patients:  StrictlyIdeas.no Fact Sheet for Healthcare Providers: BankingDealers.co.za This test is not yet approved or cleared by the [place] FDA and has been authorized for detection and/or diagnosis of SARS-CoV-2 by FDA under an Emergency Use Authorization (EUA).  This EUA will remain in effect (meaning this test can be used) for the duration of the COVID-19 declaration under Section 564(b)(1) of the Act, 21 U.S.C. section 360bbb-3(b)(1), unless the authorization is terminated or revoked sooner. Performed at [facility], [address] [place], [place] [address]   Blood culture (routine x 2)     Status: None (Preliminary result)   Collection Time: 09/22/18  6:09 PM   Specimen: BLOOD  Result Value Ref Range Status   Specimen Description BLOOD BLOOD LEFT WRIST  Final   Special Requests   Final    BOTTLES DRAWN AEROBIC AND ANAEROBIC Blood Culture adequate volume   Culture   Final    NO GROWTH < 24 HOURS Performed at [facility], [address] [place],  [address]    Report Status PENDING  Incomplete    Coagulation Studies: Recent Labs    09/23/18 0203  LABPROT 13.6  INR 1.1    Urinalysis:  Recent Labs  Lab 09/22/18 1801  COLORURINE YELLOW*  LABSPEC 1.017  PHURINE 6.0  GLUCOSEU 50*  HGBUR NEGATIVE  BILIRUBINUR NEGATIVE  KETONESUR NEGATIVE  PROTEINUR 100*  NITRITE NEGATIVE  LEUKOCYTESUR NEGATIVE    Lipid Panel:     Component Value Date/Time   CHOL 108 08/10/2015 1520   TRIG 92 08/10/2015 1520   HDL 42 08/10/2015 1520   CHOLHDL 2.6 08/10/2015 1520   VLDL 18 08/10/2015 1520   LDLCALC 48 08/10/2015 1520    HgbA1C:  Lab Results  Component Value Date    HGBA1C 6.1 (H) 08/10/2015    Urine Drug Screen:      Component Value Date/Time   LABOPIA NONE DETECTED 09/22/2018 1801   COCAINSCRNUR NONE DETECTED 09/22/2018 1801   LABBENZ POSITIVE (A) 09/22/2018 1801   AMPHETMU NONE DETECTED 09/22/2018 1801   THCU POSITIVE (A) 09/22/2018 1801   LABBARB NONE DETECTED 09/22/2018 1801    Alcohol Level: No results for input(s): ETH in the last 168 hours.  Other results: EKG: normal EKG, normal sinus rhythm, unchanged from previous tracings.  Imaging: Ct Head Wo Contrast  Result Date: 09/22/2018 CLINICAL DATA:  Seizure EXAM: CT HEAD WITHOUT CONTRAST TECHNIQUE: Contiguous axial images were obtained from the base of the skull through the vertex without intravenous contrast. COMPARISON:  CT head 03/27/2018.  MRI  03/28/2018 FINDINGS: Brain: Ventricle size normal. Patchy bilateral white matter disease unchanged from prior studies. Negative for acute infarct, hemorrhage, mass. No fluid collection or midline shift. Vascular: Negative for hyperdense vessel Skull: Negative Sinuses/Orbits: Negative Other: None IMPRESSION: No acute abnormality. Chronic white matter changes stable from prior studies Electronically Signed   By: Franchot Gallo M.D.   On: 09/22/2018 19:11   Dg Chest Portable 1 View  Result Date: 09/22/2018 CLINICAL DATA:  Pt brought in via ems from home with a seizure. Ems report 3rd seizure of the day. Pt postictal on arrival . Seizure pads in place. md at bedside . Iv in place. Pt on 2 liters oxygen. Ems report pt is taking her seizure meds per family. EXAM: PORTABLE CHEST - 1 VIEW COMPARISON:  10/05/2017 FINDINGS: Lungs clear. Heart size and mediastinal contours are within normal limits. No effusion. Visualized bones unremarkable. IMPRESSION: No acute cardiopulmonary disease. Electronically Signed   By: Lucrezia Europe M.D.   On: 09/22/2018 19:20     Assessment/Plan:  63 y.o. female with a known history of hypertension, hyperlipidemia, seizures, history  of prior strokes comes to the emergency room after she had experienced seizures at home and was witnessed by EMS as well. Patient was taken of [place] and started recently on Lamictal in February 2020. As per patient states she takes her medication consistently but doe not remember the dose.  Multiple suspected seizures in ED.  Pt is suspected to be septic and started on broad spectrum antibiotics.   - Suspect provoked seizure in setting of infection.  - Lactic acid down to 0.8 from 5.6 - suspected Urosepsis but has improved.  Mentation improved as well  - I do not think this is meningitis picture as no neck rigidity/stiffnes and able to move it on her own.  No pain in neck when raising lower extremities - I only see Lamictal 75 BID on past medications but pt does state she takes Keppra so started 500 BID for at least 7 days then can come off and follow up neurology as out patient.   - Seizure activity likely provoked given the infection  09/23/2018, 10:18 AM

## 2018-09-24 ENCOUNTER — Encounter: Payer: Self-pay | Admitting: *Deleted

## 2018-09-24 LAB — LAMOTRIGINE LEVEL: Lamotrigine Lvl: 7.2 ug/mL (ref 2.0–20.0)

## 2018-09-24 LAB — HIV ANTIBODY (ROUTINE TESTING W REFLEX): HIV Screen 4th Generation wRfx: NONREACTIVE

## 2018-09-24 LAB — CREATININE, SERUM
Creatinine, Ser: 0.95 mg/dL (ref 0.44–1.00)
GFR calc Af Amer: >60 mL/min (ref >60)
GFR calc non Af Amer: >60 mL/min (ref >60)

## 2018-09-24 MED ORDER — SODIUM CHLORIDE 0.9 % IV SOLN
1.0000 g | INTRAVENOUS | Status: DC
  Administered 2018-09-24: 1 g via INTRAVENOUS
  Filled 2018-09-24: qty 1
  Filled 2018-09-24 (×2): qty 10

## 2018-09-24 MED ORDER — SODIUM CHLORIDE 0.9 % IV SOLN
2.0000 g | Freq: Two times a day (BID) | INTRAVENOUS | Status: DC
  Filled 2018-09-24 (×2): qty 2

## 2018-09-24 NOTE — Progress Notes (Signed)
Pharmacy Antibiotic Note  Sandra Charles is a 63 y.o. female admitted on 09/22/2018 with sepsis.  Pharmacy has been consulted for vanc/cefepime dosing. Patient received vanc 1g and cefepime 2g IV x 1 in ED.  Plan: Change cefepime 2g IV q8h to q12h based on current renal function   Height: 5' (152.4 cm) Weight: 168 lb 1.6 oz (76.2 kg) IBW/kg (Calculated) : 45.5  Temp (24hrs), Avg:98.3 F (36.8 C), Min:97.9 F (36.6 C), Max:98.7 F (37.1 C)  Recent Labs  Lab 09/22/18 1800 09/22/18 1805 09/23/18 0014 09/23/18 0203 09/24/18 0424  WBC 14.1*  --  11.5* 10.6*  --   CREATININE 1.05*  --  0.85 0.87 0.95  LATICACIDVEN  --  5.6* 0.8  --   --     Estimated Creatinine Clearance: 57.5 mL/min (by C-G formula based on SCr of 0.95 mg/dL).    Allergies  Allergen Reactions  . Lovastatin Other (See Comments)    Unknown reaction    Thank you for allowing pharmacy to be a part of this patient's care.   Rayna Sexton, PharmD, BCPS Clinical Pharmacist 09/24/2018 2:14 PM

## 2018-09-24 NOTE — Progress Notes (Signed)
[facility] [place] NAME: Sandra Charles    MR#:  [id number]  DATE OF BIRTH:  1955/07/22  SUBJECTIVE:  CHIEF COMPLAINT:   Chief Complaint  Patient presents with  . Seizures   The patient is more awake, AAO x 2.  She denies any symptoms generalized weakness.  No active seizure after admission. REVIEW OF SYSTEMS:  Review of Systems  Constitutional: Positive for malaise/fatigue. Negative for chills and fever.  HENT: Negative for sore throat.   Eyes: Negative for blurred vision and double vision.  Respiratory: Negative for cough, hemoptysis, shortness of breath, wheezing and stridor.   Cardiovascular: Negative for chest pain, palpitations, orthopnea and leg swelling.  Gastrointestinal: Negative for abdominal pain, blood in stool, diarrhea, melena, nausea and vomiting.  Genitourinary: Negative for dysuria, flank pain and hematuria.  Musculoskeletal: Negative for back pain and joint pain.  Skin: Negative for rash.  Neurological: Negative for dizziness, sensory change, focal weakness, seizures, loss of consciousness, weakness and headaches.  Endo/Heme/Allergies: Negative for polydipsia.  Psychiatric/Behavioral: Negative for depression. The patient is not nervous/anxious.     DRUG ALLERGIES:   Allergies  Allergen Reactions  . Lovastatin Other (See Comments)    Unknown reaction   VITALS:  Blood pressure 137/69, pulse 63, temperature 97.9 F (36.6 C), temperature source Oral, resp. rate 19, height 5' (1.524 m), weight 76.2 kg, SpO2 98 %. PHYSICAL EXAMINATION:  Physical Exam Constitutional:      General: She is not in acute distress. HENT:     Head: Normocephalic.     Mouth/Throat:     Mouth: Mucous membranes are moist.  Eyes:     General: No scleral icterus.    Conjunctiva/sclera: Conjunctivae normal.     Pupils: Pupils are equal, round, and reactive to light.  Neck:     Musculoskeletal: Normal range of motion and neck supple.   Vascular: No JVD.     Trachea: No tracheal deviation.  Cardiovascular:     Rate and Rhythm: Normal rate and regular rhythm.     Heart sounds: Normal heart sounds. No murmur. No gallop.   Pulmonary:     Effort: Pulmonary effort is normal. No respiratory distress.     Breath sounds: Normal breath sounds. No wheezing or rales.  Abdominal:     General: Bowel sounds are normal. There is no distension.     Palpations: Abdomen is soft.     Tenderness: There is no abdominal tenderness. There is no rebound.  Musculoskeletal: Normal range of motion.        General: No tenderness.     Right lower leg: No edema.     Left lower leg: No edema.  Skin:    Findings: No erythema or rash.  Neurological:     General: No focal deficit present.     Mental Status: She is alert.     Cranial Nerves: No cranial nerve deficit.  Psychiatric:     Comments: Confused.    LABORATORY PANEL:  Female CBC Recent Labs  Lab 09/23/18 0203  WBC 10.6*  HGB 11.3*  HCT 35.8*  PLT 179   ------------------------------------------------------------------------------------------------------------------ Chemistries  Recent Labs  Lab 09/22/18 1800  09/23/18 0203 09/24/18 0424  NA 138  --  137  --   K 4.3  --  3.7  --   CL 100  --  103  --   CO2 22  --  25  --   GLUCOSE 135*  --  98  --   BUN 12  --  12  --   CREATININE 1.05*   < > 0.87 0.95  CALCIUM 9.9  --  8.9  --   AST 34  --   --   --   ALT 29  --   --   --   ALKPHOS 106  --   --   --   BILITOT 0.6  --   --   --    < > = values in this interval not displayed.   RADIOLOGY:  No results found. ASSESSMENT AND PLAN:   Sandra Charles  is a 63 y.o. female with a known history of hypertension, hyperlipidemia, seizures, history of many strokes comes to the emergency room after she had experienced seizures at home and was witnessed by EMS as well.  1. Breakthrough seizures with fever likely in the setting of sepsis with UTI -admit to medical floor -seizure  precautions, IV PRN Ativan Continue Keppra and Lamictal -CT head no acute abnormality -attempt was made to do LP times two by ER physician--- unsuccessful -Likely meningitis; Lamictal 75 BID and Keppra so 500 BID for at least 7 days then can come off and follow up neurology as out patient per Dr. Irish Elders.  2. Sepsis suspected due to UTI She has been treated with IV cefepime, urine culture and blood culture unremarkable so far. -patient is COVID-19 negative  Lactic acidosis, improved with above treatment.  3. Hypertension resumed home meds, blood pressure is controlled.  4. Hyperlipidemia resume home meds  Discussed with Dr. Irish Elders. All the records are reviewed and case discussed with Care Management/Social Worker. Management plans discussed with the patient, her son and they are in agreement.  CODE STATUS: Full Code  TOTAL TIME TAKING CARE OF THIS PATIENT: 28 minutes.   More than 50% of the time was spent in counseling/coordination of care: YES  POSSIBLE D/C IN 2 DAYS, DEPENDING ON CLINICAL CONDITION.   Demetrios Loll M.D on 09/24/2018 at 12:35 PM  Between 7am to 6pm - Pager - [phone]  After 6pm go to [email or web address] - [facility]

## 2018-09-24 NOTE — Consult Note (Signed)
Subjective: Back to baseline. No further seizure activity.    Past Medical History:  Diagnosis Date  . Anxiety   . Arthritis    "all over"  . Chronic lower back pain   . Depression   . GERD (gastroesophageal reflux disease)   . Headache    "weekly" (07/15/2015)  . Hyperlipidemia   . Hypertension   . Mini stroke ([place])    "several since 05/2014" (07/15/2015)  . Seizures ([place]) dx'd 04/2015    Past Surgical History:  Procedure Laterality Date  . DILATION AND CURETTAGE OF UTERUS    . LUMBAR PUNCTURE  07/14/2015  . TUBAL LIGATION    . VAGINAL HYSTERECTOMY      Family History  Problem Relation Age of Onset  . Hyperlipidemia Mother   . Breast cancer Mother 48  . Hypertension Mother   . Heart attack Mother        age 40's  . Hyperlipidemia Father   . Hypertension Father   . Heart attack Father 35  . Breast cancer Sister 69    Social History:  reports that she has quit smoking. Her smoking use included cigarettes. She has a 5.00 pack-year smoking history. She has never used smokeless tobacco. She reports current alcohol use. She reports that she does not use drugs.  Allergies  Allergen Reactions  . Lovastatin Other (See Comments)    Unknown reaction    Medications: I have reviewed the patient's current medications.   Physical Examination: Blood pressure (!) 148/65, pulse 64, temperature 97.9 F (36.6 C), temperature source Oral, resp. rate 19, height 5' (1.524 m), weight 76.2 kg, SpO2 98 %.   Neurological Examination   Mental Status: Alert, oriented, thought content appropriate, slow speech  Cranial Nerves: II: Discs flat bilaterally; Visual fields grossly normal, pupils equal, round, reactive to light and accommodation III,IV, VI: ptosis not present, extra-ocular motions intact bilaterally V,VII: smile symmetric, facial light touch sensation normal bilaterally VIII: hearing normal bilaterally IX,X: gag reflex present XI: bilateral shoulder shrug XII: midline tongue  extension Motor: Generalized weakness with 4/5 bilaterally  Tone and bulk:normal tone throughout; no atrophy noted Sensory: Pinprick and light touch intact throughout, bilaterally Deep Tendon Reflexes: 1+ and symmetric throughout Plantars: Right: downgoing   Left: downgoing Cerebellar: normal finger-to-nose Gait: not tested     Laboratory Studies:   Basic Metabolic Panel: Recent Labs  Lab 09/22/18 1800 09/23/18 0014 09/23/18 0203 09/24/18 0424  NA 138  --  137  --   K 4.3  --  3.7  --   CL 100  --  103  --   CO2 22  --  25  --   GLUCOSE 135*  --  98  --   BUN 12  --  12  --   CREATININE 1.05* 0.85 0.87 0.95  CALCIUM 9.9  --  8.9  --     Liver Function Tests: Recent Labs  Lab 09/22/18 1800  AST 34  ALT 29  ALKPHOS 106  BILITOT 0.6  PROT 8.8*  ALBUMIN 4.6   No results for input(s): LIPASE, AMYLASE in the last 168 hours. No results for input(s): AMMONIA in the last 168 hours.  CBC: Recent Labs  Lab 09/22/18 1800 09/23/18 0014 09/23/18 0203  WBC 14.1* 11.5* 10.6*  NEUTROABS 12.5* 9.1*  --   HGB 13.3 11.5* 11.3*  HCT 43.4 36.0 35.8*  MCV 84.3 81.3 82.7  PLT 231 194 179    Cardiac Enzymes: No results for input(s): CKTOTAL,  CKMB, CKMBINDEX, TROPONINI in the last 168 hours.  BNP: Invalid input(s): POCBNP  CBG: No results for input(s): GLUCAP in the last 168 hours.  Microbiology: Results for orders placed or performed during the hospital encounter of 09/22/18  Blood culture (routine x 2)     Status: None (Preliminary result)   Collection Time: 09/22/18  6:04 PM   Specimen: BLOOD  Result Value Ref Range Status   Specimen Description BLOOD LEFT ANTECUBITAL  Final   Special Requests   Final    BOTTLES DRAWN AEROBIC AND ANAEROBIC Blood Culture adequate volume   Culture   Final    NO GROWTH 2 DAYS Performed at [facility], [address] [place], [place] [address]    Report Status PENDING  Incomplete  SARS Coronavirus 2 (CEPHEID -  Performed in [facility] hospital lab), Hosp Order     Status: None   Collection Time: 09/22/18  6:05 PM   Specimen: Nasopharyngeal Swab  Result Value Ref Range Status   SARS Coronavirus 2 NEGATIVE NEGATIVE Final    Comment: (NOTE) If result is NEGATIVE SARS-CoV-2 target nucleic acids are NOT DETECTED. The SARS-CoV-2 RNA is generally detectable in upper and lower  respiratory specimens during the acute phase of infection. The lowest  concentration of SARS-CoV-2 viral copies this assay can detect is 250  copies / mL. A negative result does not preclude SARS-CoV-2 infection  and should not be used as the sole basis for treatment or other  patient management decisions.  A negative result may occur with  improper specimen collection / handling, submission of specimen other  than nasopharyngeal swab, presence of viral mutation(s) within the  areas targeted by this assay, and inadequate number of viral copies  (<250 copies / mL). A negative result must be combined with clinical  observations, patient history, and epidemiological information. If result is POSITIVE SARS-CoV-2 target nucleic acids are DETECTED. The SARS-CoV-2 RNA is generally detectable in upper and lower  respiratory specimens dur ing the acute phase of infection.  Positive  results are indicative of active infection with SARS-CoV-2.  Clinical  correlation with patient history and other diagnostic information is  necessary to determine patient infection status.  Positive results do  not rule out bacterial infection or co-infection with other viruses. If result is PRESUMPTIVE POSTIVE SARS-CoV-2 nucleic acids MAY BE PRESENT.   A presumptive positive result was obtained on the submitted specimen  and confirmed on repeat testing.  While 2019 novel coronavirus  (SARS-CoV-2) nucleic acids may be present in the submitted sample  additional confirmatory testing may be necessary for epidemiological  and / or clinical management  purposes  to differentiate between  SARS-CoV-2 and other Sarbecovirus currently known to infect humans.  If clinically indicated additional testing with an alternate test  methodology [phone]) is advised. The SARS-CoV-2 RNA is generally  detectable in upper and lower respiratory sp ecimens during the acute  phase of infection. The expected result is Negative. Fact Sheet for Patients:  StrictlyIdeas.no Fact Sheet for Healthcare Providers: BankingDealers.co.za This test is not yet approved or cleared by the [place] FDA and has been authorized for detection and/or diagnosis of SARS-CoV-2 by FDA under an Emergency Use Authorization (EUA).  This EUA will remain in effect (meaning this test can be used) for the duration of the COVID-19 declaration under Section 564(b)(1) of the Act, 21 U.S.C. section 360bbb-3(b)(1), unless the authorization is terminated or revoked sooner. Performed at [facility], [address],  [place], [place] [address]   Blood culture (routine x 2)     Status: None (Preliminary result)   Collection Time: 09/22/18  6:09 PM   Specimen: BLOOD  Result Value Ref Range Status   Specimen Description BLOOD BLOOD LEFT WRIST  Final   Special Requests   Final    BOTTLES DRAWN AEROBIC AND ANAEROBIC Blood Culture adequate volume   Culture   Final    NO GROWTH 2 DAYS Performed at [facility], [address], [place], [place] [address]    Report Status PENDING  Incomplete  Urine culture     Status: None   Collection Time: 09/22/18  6:48 PM   Specimen: Urine, Clean Catch  Result Value Ref Range Status   Specimen Description   Final    URINE, CLEAN CATCH Performed at [facility], [address] [place], [place] [address]    Special Requests   Final    NONE Performed at [facility], [address], [place], [place] [address]    Culture   Final    NO GROWTH Performed at [facility], [place] [address] [place], [place] [address]    Report Status 09/23/2018 FINAL  Final    Coagulation Studies: Recent Labs    09/23/18 0203  LABPROT 13.6  INR 1.1    Urinalysis:  Recent Labs  Lab 09/22/18 1801  COLORURINE YELLOW*  LABSPEC 1.017  PHURINE 6.0  GLUCOSEU 50*  HGBUR NEGATIVE  BILIRUBINUR NEGATIVE  KETONESUR NEGATIVE  PROTEINUR 100*  NITRITE NEGATIVE  LEUKOCYTESUR NEGATIVE    Lipid Panel:     Component Value Date/Time   CHOL 108 08/10/2015 1520   TRIG 92 08/10/2015 1520   HDL 42 08/10/2015 1520   CHOLHDL 2.6 08/10/2015 1520   VLDL 18 08/10/2015 1520   LDLCALC 48 08/10/2015 1520    HgbA1C:  Lab Results  Component Value Date   HGBA1C 6.1 (H) 08/10/2015    Urine Drug Screen:      Component Value Date/Time   LABOPIA NONE DETECTED 09/22/2018 1801   COCAINSCRNUR NONE DETECTED 09/22/2018 1801   LABBENZ POSITIVE (A) 09/22/2018 1801   AMPHETMU NONE DETECTED 09/22/2018 1801   THCU POSITIVE (A) 09/22/2018 1801   LABBARB NONE DETECTED 09/22/2018 1801    Alcohol Level: No results for input(s): ETH in the last 168 hours.  Other results: EKG: normal EKG, normal sinus rhythm, unchanged from previous tracings.  Imaging: Ct Head Wo Contrast  Result Date: 09/22/2018 CLINICAL DATA:  Seizure EXAM: CT HEAD WITHOUT CONTRAST TECHNIQUE: Contiguous axial images were obtained from the base of the skull through the vertex without intravenous contrast. COMPARISON:  CT head 03/27/2018.  MRI 03/28/2018 FINDINGS: Brain: Ventricle size normal. Patchy bilateral white matter disease unchanged from prior studies. Negative for acute infarct, hemorrhage, mass. No fluid collection or midline shift. Vascular: Negative for hyperdense vessel Skull: Negative Sinuses/Orbits: Negative Other: None IMPRESSION: No acute abnormality. Chronic white matter changes stable from prior studies Electronically Signed   By: Franchot Gallo M.D.   On: 09/22/2018 19:11   Dg Chest Portable  1 View  Result Date: 09/22/2018 CLINICAL DATA:  Pt brought in via ems from home with a seizure. Ems report 3rd seizure of the day. Pt postictal on arrival . Seizure pads in place. md at bedside . Iv in place. Pt on 2 liters oxygen. Ems report pt is taking her seizure meds per family. EXAM: PORTABLE CHEST - 1 VIEW COMPARISON:  10/05/2017 FINDINGS:  Lungs clear. Heart size and mediastinal contours are within normal limits. No effusion. Visualized bones unremarkable. IMPRESSION: No acute cardiopulmonary disease. Electronically Signed   By: Lucrezia Europe M.D.   On: 09/22/2018 19:20     Assessment/Plan:  63 y.o. female with a known history of hypertension, hyperlipidemia, seizures, history of prior strokes comes to the emergency room after she had experienced seizures at home and was witnessed by EMS as well. Patient was taken of [place] and started recently on Lamictal in February 2020. As per patient states she takes her medication consistently but doe not remember the dose.  Multiple suspected seizures in ED.  Pt is suspected to be septic and started on broad spectrum antibiotics.   - Suspect provoked seizure in setting of infection.  - Lactic acid down to 0.8 from 5.6 - suspected Urosepsis but has improved.  Mentation improved as well, at baseline - I do not think this is meningitis picture as no neck rigidity/stiffnes and able to move it on her own.  No pain in neck when raising lower extremities - Lamictal 75 BID and Keppra so 500 BID for at least 7 days then can come off and follow up neurology as out patient.   - Seizure activity likely provoked given the infection  - no further imaging from neuro perspective. Can be d/c once safe from medical stand point.   09/24/2018, 10:56 AM

## 2018-09-25 DIAGNOSIS — G3183 Dementia with Lewy bodies: Secondary | ICD-10-CM

## 2018-09-25 DIAGNOSIS — R440 Auditory hallucinations: Secondary | ICD-10-CM

## 2018-09-25 MED ORDER — LEVETIRACETAM 500 MG PO TABS: 500.0000 mg | ORAL_TABLET | Freq: Two times a day (BID) | ORAL | 0 refills | Status: DC

## 2018-09-25 NOTE — Consult Note (Signed)
[facility] Consult   Reason for Consult:  Hallucinations Referring Physician:  Dr. Bridgett Larsson Patient Identification: Sandra Charles MRN:  [id number] Principal Diagnosis: <principal problem not specified> Diagnosis:  Active Problems:   Sepsis ([place])   Total Time spent with patient: 30 minutes  Subjective:   Sandra Charles is a 63 y.o. female patient reports that she became agitated today because she heard people talking about her son.  When asked which people she stated the staff.  She states that she can see them talking about her and telling other people that she has HIV.  Patient denies any suicidal or homicidal ideations and denies any hallucinations.  I asked her if she has been hearing or seeing anything else and she states that there is nothing else going on except for the people talking about me having HIV.  Sandra Charles is changed to something other than the people talking about her and the patient comes immediately and discusses her son and his work and what she used to do for her work and calms down and is able to be redirected.  She continues to deny any suicidal or homicidal ideations and denies any hallucinations.  She states that she is just ready to go home she knows that she is being discharged today.  Patient's son, Sandra Charles, was contacted for collateral information.  He states that the patient does have a history of schizoaffective disorder and that she has been hospitalized in the past and is on medications and has a current psychiatrist through [facility].  He states that she was diagnosed with Lewy body dementia approximately 3 years ago.  He states that this type of behavior has happened in the past and usually she is redirectable and can calm down.  He states that sometimes it can take a little while to talk to her.  He states that normally they have seen this type of behavior when he that she has been ill such as with a UTI or she has had a seizure.  He reports that  the patient had numerous seizures last week and this when they brought her to the hospital and then they found out that she had a UTI and was admitted to the hospital.  He states that the patient has never had any type of self-harm behavior and is never threatened to kill herself or to harm anyone else.  He states that she does have some conversation that is aggressive at these times but is never done anything to anyone.  He states that she can be like this 1 day and the next day she will be completely pleasant calm and cooperative will be a very sweet person.  HPI:  63 y.o.femalewith a known history ofhypertension, schizoaffective, hyperlipidemia, seizures, history of many strokes comes to the emergency room after she had experienced seizures at home and was witnessed by EMS as well.  Patient is seen by this provider face-to-face.  Patient does present in her room and appears irritable when I first approached her.  Patient does report that she feels that people outside the room are talking about her but she does not state that she is hearing other voices and she denies any command hallucinations.  She denies wanting to harm herself or anyone else.  Patient is easily redirected when discussing other things such as work and her children.  She states that she is ready to go home and is going home with her boyfriend who lives with her.  Son  was contacted for collateral and there is no imminent threat towards herself or towards anyone else at this time.  Patient does not appear to be responding to any internal or external stimuli.  After speaking with son I did go back into the room to speak with the patient and the patient was very calm and was in the bathroom washing up preparing to be discharged home.  At this time the patient does not meet inpatient criteria and is psychiatrically cleared.  I have contacted Dr. Bridgett Larsson through secure chat and notified him of the recommendations.  Past Psychiatric History:  Schizoaffective, previous hospitalizations, but years ago,  Risk to Self:   Risk to Others:   Prior Inpatient Therapy:   Prior Outpatient Therapy:    Past Medical History:  Past Medical History:  Diagnosis Date  . Anxiety   . Arthritis    "all over"  . Chronic lower back pain   . Depression   . GERD (gastroesophageal reflux disease)   . Headache    "weekly" (07/15/2015)  . Hyperlipidemia   . Hypertension   . Mini stroke ([place])    "several since 05/2014" (07/15/2015)  . Seizures ([place]) dx'd 04/2015    Past Surgical History:  Procedure Laterality Date  . DILATION AND CURETTAGE OF UTERUS    . LUMBAR PUNCTURE  07/14/2015  . TUBAL LIGATION    . VAGINAL HYSTERECTOMY     Family History:  Family History  Problem Relation Age of Onset  . Hyperlipidemia Mother   . Breast cancer Mother 45  . Hypertension Mother   . Heart attack Mother        age 85's  . Hyperlipidemia Father   . Hypertension Father   . Heart attack Father 79  . Breast cancer Sister 67   Family Psychiatric  History: None reported Social History:  Social History   Substance and Sexual Activity  Alcohol Use Yes  . Alcohol/week: 0.0 standard drinks   Comment: 07/15/2015 "nothing in the last few years"     Social History   Substance and Sexual Activity  Drug Use No    Social History   Socioeconomic History  . Marital status: Significant Other    Spouse name: Not on file  . Number of children: 1  . Years of education: Not on file  . Highest education level: Not on file  Occupational History  . Occupation: disabled  Social Needs  . Financial resource strain: Not hard at all  . Food insecurity    Worry: Never true    Inability: Never true  . Transportation needs    Medical: No    Non-medical: No  Tobacco Use  . Smoking status: Former Smoker    Packs/day: 1.00    Years: 5.00    Pack years: 5.00    Types: Cigarettes  . Smokeless tobacco: Never Used  . Tobacco comment: "quit smoking cigarettes in  the early 2000s"  Substance and Sexual Activity  . Alcohol use: Yes    Alcohol/week: 0.0 standard drinks    Comment: 07/15/2015 "nothing in the last few years"  . Drug use: No  . Sexual activity: Not Currently  Lifestyle  . Physical activity    Days per week: 0 days    Minutes per session: 0 min  . Stress: Not at all  Relationships  . Social connections    Talks on phone: Once a week    Gets together: Once a week    Attends religious service:  Never    Active member of club or organization: No    Attends meetings of clubs or organizations: Never    Relationship status: Living with partner  Other Topics Concern  . Not on file  Social History Narrative   Lives with significant other, has son who comes by daily.   Additional Social History:    Allergies:   Allergies  Allergen Reactions  . Lovastatin Other (See Comments)    Unknown reaction    Labs:  Results for orders placed or performed during the hospital encounter of 09/22/18 (from the past 48 hour(s))  Creatinine, serum     Status: None   Collection Time: 09/24/18  4:24 AM  Result Value Ref Range   Creatinine, Ser 0.95 0.44 - 1.00 mg/dL   GFR calc non Af Amer >60 >60 mL/min   GFR calc Af Amer >60 >60 mL/min    Comment: Performed at [facility], [address] [place], [place] [address]    Current Facility-Administered Medications  Medication Dose Route Frequency Provider Last Rate Last Dose  . acetaminophen (TYLENOL) tablet 650 mg  650 mg Oral Q6H PRN Fritzi Mandes, MD       Or  . acetaminophen (TYLENOL) suppository 650 mg  650 mg Rectal Q6H PRN Fritzi Mandes, MD      . amLODipine (NORVASC) tablet 10 mg  10 mg Oral Daily Fritzi Mandes, MD   10 mg at 09/25/18 0943  . aspirin EC tablet 81 mg  81 mg Oral Chong Sicilian, Gus Height, MD   81 mg at 09/25/18 0943  . cefTRIAXone (ROCEPHIN) 1 g in sodium chloride 0.9 % 100 mL IVPB  1 g Intravenous Q24H Demetrios Loll, MD   Stopped at 09/24/18 1914  . cholecalciferol (VITAMIN D3)  tablet 5,000 Units  5,000 Units Oral Weber Cooks, MD   5,000 Units at 09/25/18 [phone]  . cloNIDine (CATAPRES) tablet 0.1 mg  0.1 mg Oral BID Fritzi Mandes, MD   0.1 mg at 09/25/18 0942  . donepezil (ARICEPT) tablet 10 mg  10 mg Oral QHS Fritzi Mandes, MD   10 mg at 09/24/18 2245  . enoxaparin (LOVENOX) injection 40 mg  40 mg Subcutaneous Q24H Fritzi Mandes, MD   40 mg at 09/24/18 2245  . lamoTRIgine (LAMICTAL) tablet 75 mg  75 mg Oral BID Fritzi Mandes, MD   75 mg at 09/25/18 0942  . levETIRAcetam (KEPPRA) tablet 500 mg  500 mg Oral BID Leotis Pain, MD   500 mg at 09/25/18 0943  . LORazepam (ATIVAN) injection 1 mg  1 mg Intravenous Q6H PRN Fritzi Mandes, MD   1 mg at 09/25/18 0122  . nystatin (MYCOSTATIN/NYSTOP) topical powder   Topical BID PRN Fritzi Mandes, MD      . ondansetron [facility]) tablet 4 mg  4 mg Oral Q6H PRN Fritzi Mandes, MD       Or  . ondansetron [facility]) injection 4 mg  4 mg Intravenous Q6H PRN Fritzi Mandes, MD      . pantoprazole (PROTONIX) EC tablet 40 mg  40 mg Oral Chong Sicilian, Gus Height, MD   40 mg at 09/25/18 [phone]  . polyethylene glycol (MIRALAX / GLYCOLAX) packet 17 g  17 g Oral Daily PRN Fritzi Mandes, MD      . propranolol ER (INDERAL LA) 24 hr capsule 60 mg  60 mg Oral Daily Fritzi Mandes, MD   60 mg at 09/25/18 0943  . QUEtiapine (SEROQUEL XR) 24 hr tablet 50 mg  50 mg Oral QHS Fritzi Mandes, MD   50 mg at 09/24/18 2245  . rosuvastatin (CRESTOR) tablet 20 mg  20 mg Oral QHS Fritzi Mandes, MD   20 mg at 09/24/18 2245  . sertraline (ZOLOFT) tablet 100 mg  100 mg Oral Daily Fritzi Mandes, MD   100 mg at 09/25/18 0944  . vitamin B-12 (CYANOCOBALAMIN) tablet 1,000 mcg  1,000 mcg Oral Daily Fritzi Mandes, MD   1,000 mcg at 09/25/18 3762    Musculoskeletal: Strength & Muscle Tone: within normal limits Gait & Station: normal Patient leans: N/A  Psychiatric Specialty Exam: Physical Exam  Nursing note and vitals reviewed. Constitutional: She is oriented to person, place, and time. She appears  well-developed and well-nourished.  Cardiovascular: Normal rate.  Respiratory: Effort normal.  Musculoskeletal: Normal range of motion.  Neurological: She is alert and oriented to person, place, and time.  Skin: Skin is warm.    Review of Systems  Constitutional: Negative.   HENT: Negative.   Eyes: Negative.   Respiratory: Negative.   Cardiovascular: Negative.   Gastrointestinal: Negative.   Genitourinary: Negative.   Musculoskeletal: Negative.   Skin: Negative.   Neurological: Negative.   Endo/Heme/Allergies: Negative.   Psychiatric/Behavioral: Positive for memory loss.       Slightly paranoid but redirectable    Blood pressure (!) 151/73, pulse 77, temperature 98.4 F (36.9 C), resp. rate 18, height 5' (1.524 m), weight 71.3 kg, SpO2 99 %.Body mass index is 30.68 kg/m.  General Appearance: Disheveled  Eye Contact:  Good  Speech:  Clear and Coherent and Normal Rate  Volume:  Increased  Mood:  Irritable at first but was redirectable and calmed quickly  Affect:  Congruent  Thought Process:  Coherent and Descriptions of Associations: Intact  Orientation:  Full (Time, Place, and Person)  Thought Content:  WDL and Paranoid Ideation  Suicidal Thoughts:  No  Homicidal Thoughts:  No  Memory:  Immediate;   Good Recent;   Fair Remote;   Poor  Judgement:  Fair  Insight:  Lacking  Psychomotor Activity:  Normal  Concentration:  Concentration: Fair  Recall:  Good  Fund of Knowledge:  Fair  Language:  Good  Akathisia:  No  Handed:    AIMS (if indicated):     Assets:  Agricultural consultant Housing Resilience Social Support  ADL's:  Intact  Cognition:  Impaired,  Moderate  Sleep:        Treatment Plan Summary: Continue current medications  Follow up with outpatient providers  Disposition: No evidence of imminent risk to self or others at present.   Patient does not meet criteria for psychiatric inpatient admission.  Robin [place],  FNP 09/25/2018 12:42 PM

## 2018-09-25 NOTE — Progress Notes (Signed)
PT Cancellation Note  Patient Details Name: Sandra Charles MRN: [id number] DOB: [birth year]   Cancelled Treatment:    Reason Eval/Treat Not Completed: Other (comment). Consult received and chart reviewed. Pt currently sitting EOB eating lunch. Reports she has been able to ambulate around RN station independently. She is refusing PT eval at this time as she is imminently dischargin, however requesting RW for home use. Updated CM.   Bentlie Withem 09/25/2018, 2:14 PM  Greggory Stallion, PT, DPT [phone]

## 2018-09-25 NOTE — Progress Notes (Signed)
Speech Therapy note: reviewed chart notes; consulted MD re: pt's status today and if a need for reassessment(MD had placed order to upgrade to regular consistency diet). Per NSG report, pt is swallowing Pills w/ water and eating some at all of her meals w/ no overt s/s of aspiration or oral phase deficits noted.  As MD/pt was asking about a move to regular consistency(no cut meats in diet), recommend move diet to a regular consistency w/ NSG to monitor pt cutting her own meats and eating foods safely. MD agreed. Diet upgraded. MD/NSG to reconsult ST services if any problems noted while admitted.     Orinda Kenner, [place], CCC-SLP

## 2018-09-25 NOTE — Discharge Summary (Addendum)
[facility] NAME: Sandra Charles    MR#:  [id number]  DATE OF BIRTH:  [birth year]  DATE OF ADMISSION:  09/22/2018   ADMITTING PHYSICIAN: Sandra Mandes, MD  DATE OF DISCHARGE:  09/25/2018 PRIMARY CARE PHYSICIAN: Sandra Ditto, MD   ADMISSION DIAGNOSIS:  Seizure ([place]) [R56.9] Fever, unspecified fever cause [R50.9] DISCHARGE DIAGNOSIS:  Active Problems:   Sepsis ([place])  SECONDARY DIAGNOSIS:   Past Medical History:  Diagnosis Date  . Anxiety   . Arthritis    "all over"  . Chronic lower back Charles   . Depression   . GERD (gastroesophageal reflux disease)   . Headache    "weekly" (07/15/2015)  . Hyperlipidemia   . Hypertension   . Mini stroke ([place])    "several since 05/2014" (07/15/2015)  . Seizures ([place]) dx'd 04/2015   HOSPITAL COURSE:  MaryMooreis a63 y.o.femalewith a known history ofhypertension, hyperlipidemia, seizures, history of many strokes comes to the emergency room after she had experienced seizures at home and was witnessed by EMS as well.  1.Breakthrough seizures with fever likely in the setting of sepsis with UTI -admit to medical floor -seizure precautions, IV PRN Ativan Continue Keppra and Lamictal -CT head no acute abnormality -attempt was made to do LP times two by ER physician---unsuccessful -Likely meningitis; Lamictal 75 BIDandKeppra so 500 BID for at least 7 days then can come off and follow up neurology as out patient per Dr. Irish Elders. Per Dr.  Irish Elders, discontinue Keppra due to hallucination.  2.Sepsis suspected due to UTI She has been treated with IVcefepime, urine culture and blood culture unremarkable so far.  No need to continue antibiotics. -patient is COVID-19 negative  Lactic acidosis, improved with above treatment.  3.Hypertension resumed home meds, blood pressure is controlled.  4.Hyperlipidemia resume home meds Hallucination.  Per psych consult, the patient does not  meet inpatient criteria, can be discharged to home,  Discussed with Dr. Irish Elders and psych nurse practitioner. DISCHARGE CONDITIONS:  Stable, discharge to home today. CONSULTS OBTAINED:  Treatment Team:  Sandra Pain, MD DRUG ALLERGIES:   Allergies  Allergen Reactions  . Lovastatin Other (See Comments)    Unknown reaction   DISCHARGE MEDICATIONS:   Allergies as of 09/25/2018      Reactions   Lovastatin Other (See Comments)   Unknown reaction      Medication List    TAKE these medications   amLODipine 10 MG tablet Commonly known as: NORVASC Take 10 mg by mouth daily. For high blood pressure   aspirin EC 81 MG tablet Take 81 mg by mouth every morning.   cloNIDine 0.1 MG tablet Commonly known as: CATAPRES Take 1 tablet (0.1 mg total) by mouth 2 (two) times daily.   donepezil 10 MG tablet Commonly known as: ARICEPT Take 10 mg by mouth at bedtime.   lamoTRIgine 25 MG tablet Commonly known as: LAMICTAL Take 3 tablets (75 mg total) by mouth 2 (two) times daily.   nystatin powder Generic drug: nystatin Apply topically 2 (two) times daily.   pantoprazole 40 MG tablet Commonly known as: Protonix Take 1 tablet (40 mg total) by mouth daily. What changed: when to take this   propranolol ER 60 MG 24 hr capsule Commonly known as: INDERAL LA Take 60 mg by mouth daily.   QUEtiapine 50 MG Tb24 24 hr tablet Commonly known as: SEROQUEL XR Take 50 mg by mouth at bedtime.   rosuvastatin 20 MG tablet Commonly known  as: CRESTOR Take 20 mg by mouth at bedtime. For high cholesterol   sertraline 100 MG tablet Commonly known as: ZOLOFT Take 100 mg by mouth daily.   vitamin B-12 1000 MCG tablet Commonly known as: CYANOCOBALAMIN Take 1,000 mcg by mouth daily.   Vitamin D3 125 MCG (5000 UT) Tabs Take 5,000 Units by mouth every morning.        DISCHARGE INSTRUCTIONS:  See AVS.  If you experience worsening of your admission symptoms, develop shortness of breath,  life threatening emergency, suicidal or homicidal thoughts you must seek medical attention immediately by calling 911 or calling your MD immediately  if symptoms less severe.  You Must read complete instructions/literature along with all the possible adverse reactions/side effects for all the Medicines you take and that have been prescribed to you. Take any new Medicines after you have completely understood and accpet all the possible adverse reactions/side effects.   Please note  You were cared for by a hospitalist during your hospital stay. If you have any questions about your discharge medications or the care you received while you were in the hospital after you are discharged, you can call the unit and asked to speak with the hospitalist on call if the hospitalist that took care of you is not available. Once you are discharged, your primary care physician will handle any further medical issues. Please note that NO REFILLS for any discharge medications will be authorized once you are discharged, as it is imperative that you return to your primary care physician (or establish a relationship with a primary care physician if you do not have one) for your aftercare needs so that they can reassess your need for medications and monitor your lab values.    On the day of Discharge:  VITAL SIGNS:  Blood pressure (!) 151/73, pulse 77, temperature 98.4 F (36.9 C), resp. rate 18, height 5' (1.524 m), weight 71.3 kg, SpO2 99 %. PHYSICAL EXAMINATION:  GENERAL:  63 y.o.-year-old patient lying in the bed with no acute distress.  EYES: Pupils equal, round, reactive to light and accommodation. No scleral icterus. Extraocular muscles intact.  HEENT: Head atraumatic, normocephalic. Oropharynx and nasopharynx clear.  NECK:  Supple, no jugular venous distention. No thyroid enlargement, no tenderness.  LUNGS: Normal breath sounds bilaterally, no wheezing, rales,rhonchi or crepitation. No use of accessory muscles of  respiration.  CARDIOVASCULAR: S1, S2 normal. No murmurs, rubs, or gallops.  ABDOMEN: Soft, non-tender, non-distended. Bowel sounds present. No organomegaly or mass.  EXTREMITIES: No pedal edema, cyanosis, or clubbing.  NEUROLOGIC: Cranial nerves II through XII are intact. Muscle strength 5/5 in all extremities. Sensation intact. Gait not checked.  PSYCHIATRIC: The patient is alert and oriented x 3.  SKIN: No obvious rash, lesion, or ulcer.  DATA REVIEW:   CBC Recent Labs  Lab 09/23/18 0203  WBC 10.6*  HGB 11.3*  HCT 35.8*  PLT 179    Chemistries  Recent Labs  Lab 09/22/18 1800  09/23/18 0203 09/24/18 0424  NA 138  --  137  --   K 4.3  --  3.7  --   CL 100  --  103  --   CO2 22  --  25  --   GLUCOSE 135*  --  98  --   BUN 12  --  12  --   CREATININE 1.05*   < > 0.87 0.95  CALCIUM 9.9  --  8.9  --   AST 34  --   --   --  ALT 29  --   --   --   ALKPHOS 106  --   --   --   BILITOT 0.6  --   --   --    < > = values in this interval not displayed.     Microbiology Results  Results for orders placed or performed during the hospital encounter of 09/22/18  Blood culture (routine x 2)     Status: None (Preliminary result)   Collection Time: 09/22/18  6:04 PM   Specimen: BLOOD  Result Value Ref Range Status   Specimen Description BLOOD LEFT ANTECUBITAL  Final   Special Requests   Final    BOTTLES DRAWN AEROBIC AND ANAEROBIC Blood Culture adequate volume   Culture   Final    NO GROWTH 3 DAYS Performed at [facility], [address], [place], [place] [address]    Report Status PENDING  Incomplete  SARS Coronavirus 2 (CEPHEID - Performed in [place] hospital lab), Hosp Order     Status: None   Collection Time: 09/22/18  6:05 PM   Specimen: Nasopharyngeal Swab  Result Value Ref Range Status   SARS Coronavirus 2 NEGATIVE NEGATIVE Final    Comment: (NOTE) If result is NEGATIVE SARS-CoV-2 target nucleic acids are NOT DETECTED. The SARS-CoV-2 RNA is  generally detectable in upper and lower  respiratory specimens during the acute phase of infection. The lowest  concentration of SARS-CoV-2 viral copies this assay can detect is 250  copies / mL. A negative result does not preclude SARS-CoV-2 infection  and should not be used as the sole basis for treatment or other  patient management decisions.  A negative result may occur with  improper specimen collection / handling, submission of specimen other  than nasopharyngeal swab, presence of viral mutation(s) within the  areas targeted by this assay, and inadequate number of viral copies  (<250 copies / mL). A negative result must be combined with clinical  observations, patient history, and epidemiological information. If result is POSITIVE SARS-CoV-2 target nucleic acids are DETECTED. The SARS-CoV-2 RNA is generally detectable in upper and lower  respiratory specimens dur ing the acute phase of infection.  Positive  results are indicative of active infection with SARS-CoV-2.  Clinical  correlation with patient history and other diagnostic information is  necessary to determine patient infection status.  Positive results do  not rule out bacterial infection or co-infection with other viruses. If result is PRESUMPTIVE POSTIVE SARS-CoV-2 nucleic acids MAY BE PRESENT.   A presumptive positive result was obtained on the submitted specimen  and confirmed on repeat testing.  While 2019 novel coronavirus  (SARS-CoV-2) nucleic acids may be present in the submitted sample  additional confirmatory testing may be necessary for epidemiological  and / or clinical management purposes  to differentiate between  SARS-CoV-2 and other Sarbecovirus currently known to infect humans.  If clinically indicated additional testing with an alternate test  methodology [phone]) is advised. The SARS-CoV-2 RNA is generally  detectable in upper and lower respiratory sp ecimens during the acute  phase of infection.  The expected result is Negative. Fact Sheet for Patients:  StrictlyIdeas.no Fact Sheet for Healthcare Providers: BankingDealers.co.za This test is not yet approved or cleared by the [place] FDA and has been authorized for detection and/or diagnosis of SARS-CoV-2 by FDA under an Emergency Use Authorization (EUA).  This EUA will remain in effect (meaning this test can be used) for the duration of the COVID-19 declaration under  Section 564(b)(1) of the Act, 21 U.S.C. section 360bbb-3(b)(1), unless the authorization is terminated or revoked sooner. Performed at [facility], [address] [place], [place] [address]   Blood culture (routine x 2)     Status: None (Preliminary result)   Collection Time: 09/22/18  6:09 PM   Specimen: BLOOD  Result Value Ref Range Status   Specimen Description BLOOD BLOOD LEFT WRIST  Final   Special Requests   Final    BOTTLES DRAWN AEROBIC AND ANAEROBIC Blood Culture adequate volume   Culture   Final    NO GROWTH 3 DAYS Performed at [facility], [address] [place], [place] [address]    Report Status PENDING  Incomplete  Urine culture     Status: None   Collection Time: 09/22/18  6:48 PM   Specimen: Urine, Clean Catch  Result Value Ref Range Status   Specimen Description   Final    URINE, CLEAN CATCH Performed at [facility], [address] [place], [place] [address]    Special Requests   Final    NONE Performed at [facility], [address] [place], [place] [address]    Culture   Final    NO GROWTH Performed at [facility], [place] [address], [place], [place] [address]    Report Status 09/23/2018 FINAL  Final    RADIOLOGY:  No results found.   Management plans discussed with the patient, her son nd they are in agreement.  CODE STATUS: Full Code   TOTAL TIME TAKING CARE OF THIS PATIENT: 43 minutes.    Demetrios Loll M.D on 09/25/2018  at 12:55 PM  Between 7am to 6pm - Pager - [phone]  After 6pm go to [email or web address] - Proofreader  [facility]  Office  [phone]  CC: Primary care physician; Sandra Ditto, MD   Note: This dictation was prepared with Dragon dictation along with smaller phrase technology. Any transcriptional errors that result from this process are unintentional.

## 2018-09-25 NOTE — Plan of Care (Signed)
Pt cleared by psychiatry.  Pt ready for discharge home.   Problem: Education: Goal: Knowledge of General Education information will improve Description: Including pain rating scale, medication(s)/side effects and non-pharmacologic comfort measures Outcome: Completed/Met   Problem: Health Behavior/Discharge Planning: Goal: Ability to manage health-related needs will improve Outcome: Completed/Met   Problem: Clinical Measurements: Goal: Ability to maintain clinical measurements within normal limits will improve Outcome: Completed/Met Goal: Will remain free from infection Outcome: Completed/Met Goal: Diagnostic test results will improve Outcome: Completed/Met Goal: Respiratory complications will improve Outcome: Completed/Met Goal: Cardiovascular complication will be avoided Outcome: Completed/Met   Problem: Activity: Goal: Risk for activity intolerance will decrease Outcome: Completed/Met   Problem: Nutrition: Goal: Adequate nutrition will be maintained Outcome: Completed/Met   Problem: Coping: Goal: Level of anxiety will decrease Outcome: Completed/Met   Problem: Elimination: Goal: Will not experience complications related to urinary retention Outcome: Completed/Met   Problem: Pain Managment: Goal: General experience of comfort will improve Outcome: Completed/Met   Problem: Safety: Goal: Ability to remain free from injury will improve Outcome: Completed/Met

## 2018-09-25 NOTE — Plan of Care (Signed)

## 2018-09-25 NOTE — Progress Notes (Signed)
Pt alert and oriented x4.  However, she is paranoid and hallucinating. Pt stating that "next door is having a party with loud music and dancing. I can feel the bed shaking."  Pt walking around nursing unit and becoming verbally aggressive.  Pt states that she will "put a blade in your ass" to multiple staff members.  Pt believes that staff are talking about her having AIDS.  Escorted pt to her room.  De-escalated situation.    Dr. Bridgett Larsson contacted regarding the above.  Psych on-call paged.  Spoke with Darnelle Maffucci, [place] for psych and requested him to expedite the consult.

## 2018-09-25 NOTE — Progress Notes (Signed)
Pt very confused, thinks "Trevor Mace" is outside her room laughing at her and she's getting very upset won't lay down or let us help her, ripped out IV's, is very paranoid and thinks the TV captioning is talking about her and lying, we have been on and off of the phone with pt's son brandon he has helped trying to calm her down, finally got her calmed down, restarted IV and resumed IV fluids, and was able to give pt PRN ativan to help calm her down. Will continue to monitor. Conley Simmonds, RN, BSN

## 2018-09-25 NOTE — Discharge Instructions (Signed)
Epilepsy Epilepsy is when a person keeps having seizures. A seizure is a burst of abnormal activity in the brain. A seizure can change how you think or behave, and it can make it hard to be aware of what is happening. This condition can cause problems such as:  Falls, accidents, and injury.  Sadness (depression).  Poor memory.  Sudden unexplained death in epilepsy (SUDEP). This is rare. Its cause is not known. Most people with epilepsy lead normal lives. What are the causes? This condition may be caused by:  A head injury.  An injury that happens at birth.  A high fever during childhood.  A stroke.  Bleeding that goes into or around the brain.  Certain medicines and drugs.  Having too little oxygen for a long period of time.  Abnormal brain development.  Certain infections.  Brain tumors.  Conditions that are passed from parent to child (are hereditary). What are the signs or symptoms? Symptoms of a seizure vary from person to person. They may include:  Jerky movements of muscles (convulsions).  Stiffening of the body.  Movements of the arms or legs that you are not able to control.  Passing out (loss of consciousness).  Breathing problems.  Sudden falls.  Confusion.  Head nodding.  Eye blinking or twitching.  Lip smacking.  Drooling.  Fast eye movements.  Grunting.  Not being able to control when you pee or poop.  Staring.  Being hard to wake up (unresponsiveness). Some people have symptoms right before a seizure happens (aura) and right after a seizure happens. These symptoms include:  Fear or anxiety.  Feeling sick to your stomach (nauseous).  Feeling like the room is spinning (vertigo).  A feeling of having seen or heard something before (dj vu).  Odd tastes or smells.  Changes in how you see (vision), such as seeing flashing lights or spots. Symptoms that follow a seizure include:  Being confused.  Being sleepy.  Having a  headache. How is this treated? Treatment can control seizures. Treatment for this condition may involve:  Taking medicines to control seizures.  Having a device (vagus nerve stimulator) put in the chest. The device sends signals to a nerve and to the brain to prevent seizures.  Brain surgery to stop seizures from happening or to reduce how often they happen.  Having blood tests often to make sure you are getting the right amount of medicine. Once this condition has been diagnosed, it is important to start treatment as soon as possible. For some people, epilepsy goes away in time. Others will need treatment for the rest of their life. Follow these instructions at home: Medicines  Take over-the-counter and prescription medicines only as told by your doctor.  Avoid anything that may keep your medicine from working, such as alcohol. Activity  Get enough rest. Lack of sleep can make seizures more likely to occur.  Follow your doctor's advice about driving, swimming, and doing anything else that would be dangerous if you had a seizure. ? If you live in the [place] check with your local DMV (department of motor vehicles) to find out about local driving laws. Each state has rules about when you can return to driving. Teaching others Teach friends and family what to do if you have a seizure. They should:  Lay you on the ground to prevent a fall.  Cushion your head and body.  Loosen any tight clothing around your neck.  Turn you on your side.  Stay with  you until you are better.  Not hold you down.  Not put anything in your mouth.  Know whether or not you need emergency care.  General instructions  Avoid anything that causes you to have seizures.  Keep a seizure diary. Write down what you remember about each seizure. Be sure to include what might have caused it.  Keep all follow-up visits as told by your doctor. This is important. Contact a doctor if:  You have a change in how  often or when you have seizures.  You get an infection or start to feel sick. You may have more seizures when you are sick. Get help right away if:  A seizure does not stop after 5 minutes.  You have more than one seizure in a row, and you do not have enough time between the seizures to feel better.  A seizure makes it harder to breathe.  A seizure is different from other seizures you have had.  A seizure makes you unable to speak or use a part of your body.  You did not wake up right after a seizure. These symptoms may be an emergency. Do not wait to see if the symptoms will go away. Get medical help right away. Call your local emergency services (911 in the [place] Do not drive yourself to the hospital. Summary  Epilepsy is when a person keeps having seizures. A seizure is a burst of abnormal activity in the brain.  Treatment can control seizures.  Teach friends and family what to do if you have a seizure. This information is not intended to replace advice given to you by your health care provider. Make sure you discuss any questions you have with your health care provider. Document Released: 12/10/2008 Document Revised: 10/07/2017 Document Reviewed: 10/07/2017 Elsevier Patient Education  2020 [employer].

## 2018-09-27 LAB — CULTURE, BLOOD (ROUTINE X 2)
Culture: NO GROWTH
Culture: NO GROWTH
Special Requests: ADEQUATE
Special Requests: ADEQUATE

## 2019-06-16 ENCOUNTER — Ambulatory Visit: Payer: Medicare HMO | Attending: Internal Medicine

## 2019-06-16 DIAGNOSIS — Z23 Encounter for immunization: Secondary | ICD-10-CM

## 2019-06-16 NOTE — Progress Notes (Signed)
   Covid-19 Vaccination Clinic  Name:  Sandra Charles    MRN: [id number] DOB: 12/22/1955  06/16/2019  Sandra Charles was observed post Covid-19 immunization for 15 minutes without incident. She was provided with Vaccine Information Sheet and instruction to access the V-Safe system.   Sandra Charles was instructed to call 911 with any severe reactions post vaccine: Marland Kitchen Difficulty breathing  . Swelling of face and throat  . A fast heartbeat  . A bad rash all over body  . Dizziness and weakness   Immunizations Administered    Name Date Dose VIS Date Route   Pfizer COVID-19 Vaccine 06/16/2019  2:12 PM 0.3 mL 04/22/2018 Intramuscular   Manufacturer: [place]   Lot: [id number]   [place]: [id number]

## 2019-07-14 ENCOUNTER — Ambulatory Visit: Payer: Medicare HMO | Attending: Internal Medicine

## 2019-07-14 DIAGNOSIS — Z23 Encounter for immunization: Secondary | ICD-10-CM

## 2019-07-14 NOTE — Progress Notes (Signed)
   Covid-19 Vaccination Clinic  Name:  JAZALYNN WOMELSDORF    MRN: [id number] DOB: 1955-11-04  07/14/2019  Ms. Rumery was observed post Covid-19 immunization for 15 minutes without incident. She was provided with Vaccine Information Sheet and instruction to access the V-Safe system.   Ms. Swanick was instructed to call 911 with any severe reactions post vaccine: Marland Kitchen Difficulty breathing  . Swelling of face and throat  . A fast heartbeat  . A bad rash all over body  . Dizziness and weakness   Immunizations Administered    Name Date Dose VIS Date Route   Pfizer COVID-19 Vaccine 07/14/2019  1:05 PM 0.3 mL 04/22/2018 Intramuscular   Manufacturer: [place]   Lot: [id number]   [place]: [id number]

## 2019-11-24 ENCOUNTER — Other Ambulatory Visit: Payer: Self-pay | Admitting: Physician Assistant

## 2019-11-24 DIAGNOSIS — M7542 Impingement syndrome of left shoulder: Secondary | ICD-10-CM

## 2019-12-16 ENCOUNTER — Other Ambulatory Visit: Payer: Self-pay

## 2019-12-16 ENCOUNTER — Ambulatory Visit
Admission: RE | Admit: 2019-12-16 | Discharge: 2019-12-16 | Disposition: A | Discharge status: Home / Self Care | Payer: Medicare HMO | Source: Ambulatory Visit | Attending: Physician Assistant | Admitting: Physician Assistant

## 2019-12-16 DIAGNOSIS — M7542 Impingement syndrome of left shoulder: Secondary | ICD-10-CM | POA: Insufficient documentation

## 2020-08-25 ENCOUNTER — Emergency Department: Payer: Medicare HMO

## 2020-08-25 ENCOUNTER — Emergency Department
Admission: EM | Admit: 2020-08-25 | Discharge: 2020-08-26 | Payer: Medicare HMO | Attending: Emergency Medicine | Admitting: Emergency Medicine

## 2020-08-25 ENCOUNTER — Other Ambulatory Visit: Payer: Self-pay

## 2020-08-25 DIAGNOSIS — Z87891 Personal history of nicotine dependence: Secondary | ICD-10-CM | POA: Insufficient documentation

## 2020-08-25 DIAGNOSIS — J810 Acute pulmonary edema: Secondary | ICD-10-CM | POA: Diagnosis not present

## 2020-08-25 DIAGNOSIS — Z79899 Other long term (current) drug therapy: Secondary | ICD-10-CM | POA: Insufficient documentation

## 2020-08-25 DIAGNOSIS — R569 Unspecified convulsions: Secondary | ICD-10-CM | POA: Diagnosis not present

## 2020-08-25 DIAGNOSIS — J9601 Acute respiratory failure with hypoxia: Secondary | ICD-10-CM | POA: Diagnosis not present

## 2020-08-25 DIAGNOSIS — Z20822 Contact with and (suspected) exposure to covid-19: Secondary | ICD-10-CM | POA: Insufficient documentation

## 2020-08-25 DIAGNOSIS — Z7982 Long term (current) use of aspirin: Secondary | ICD-10-CM | POA: Diagnosis not present

## 2020-08-25 DIAGNOSIS — I10 Essential (primary) hypertension: Secondary | ICD-10-CM | POA: Insufficient documentation

## 2020-08-25 DIAGNOSIS — G40909 Epilepsy, unspecified, not intractable, without status epilepticus: Secondary | ICD-10-CM | POA: Insufficient documentation

## 2020-08-25 DIAGNOSIS — Z789 Other specified health status: Secondary | ICD-10-CM

## 2020-08-25 LAB — URINALYSIS, COMPLETE (UACMP) WITH MICROSCOPIC
Bilirubin Urine: NEGATIVE
Glucose, UA: NEGATIVE mg/dL
Ketones, ur: NEGATIVE mg/dL
Leukocytes,Ua: NEGATIVE
Nitrite: NEGATIVE
Protein, ur: 30 mg/dL — AB
Specific Gravity, Urine: 1.023 (ref 1.005–1.030)
Squamous Epithelial / HPF: NONE SEEN (ref 0–5)
pH: 5 (ref 5.0–8.0)

## 2020-08-25 LAB — BLOOD GAS, ARTERIAL
Acid-base deficit: 0.4 mmol/L (ref 0.0–2.0)
Allens test (pass/fail): POSITIVE — AB
Bicarbonate: 25.4 mmol/L (ref 20.0–28.0)
FIO2: 0.45
MECHVT: 450 mL
O2 Saturation: 98.5 %
PEEP: 5 cmH2O
Patient temperature: 37
RATE: 16 resp/min
pCO2 arterial: 46 mmHg (ref 32.0–48.0)
pH, Arterial: 7.35 (ref 7.350–7.450)
pO2, Arterial: 121 mmHg — ABNORMAL HIGH (ref 83.0–108.0)

## 2020-08-25 LAB — COMPREHENSIVE METABOLIC PANEL
ALT: 66 U/L — ABNORMAL HIGH (ref 0–44)
AST: 86 U/L — ABNORMAL HIGH (ref 15–41)
Albumin: 3.8 g/dL (ref 3.5–5.0)
Alkaline Phosphatase: 106 U/L (ref 38–126)
Anion gap: 9 (ref 5–15)
BUN: 13 mg/dL (ref 8–23)
CO2: 24 mmol/L (ref 22–32)
Calcium: 9.2 mg/dL (ref 8.9–10.3)
Chloride: 105 mmol/L (ref 98–111)
Creatinine, Ser: 0.95 mg/dL (ref 0.44–1.00)
GFR, Estimated: >60 mL/min (ref >60)
Glucose, Bld: 131 mg/dL — ABNORMAL HIGH (ref 70–99)
Potassium: 4 mmol/L (ref 3.5–5.1)
Sodium: 138 mmol/L (ref 135–145)
Total Bilirubin: 0.6 mg/dL (ref 0.3–1.2)
Total Protein: 7.8 g/dL (ref 6.5–8.1)

## 2020-08-25 LAB — RESP PANEL BY RT-PCR (FLU A&B, COVID) ARPGX2
Influenza A by PCR: NEGATIVE
Influenza B by PCR: NEGATIVE
SARS Coronavirus 2 by RT PCR: NEGATIVE

## 2020-08-25 LAB — CBC WITH DIFFERENTIAL/PLATELET
Abs Immature Granulocytes: 0.08 10*3/uL — ABNORMAL HIGH (ref 0.00–0.07)
Basophils Absolute: 0 10*3/uL (ref 0.0–0.1)
Basophils Relative: 0 %
Eosinophils Absolute: 0 10*3/uL (ref 0.0–0.5)
Eosinophils Relative: 0 %
HCT: 34 % — ABNORMAL LOW (ref 36.0–46.0)
Hemoglobin: 10.3 g/dL — ABNORMAL LOW (ref 12.0–15.0)
Immature Granulocytes: 1 %
Lymphocytes Relative: 6 %
Lymphs Abs: 0.8 10*3/uL (ref 0.7–4.0)
MCH: 21.9 pg — ABNORMAL LOW (ref 26.0–34.0)
MCHC: 30.3 g/dL (ref 30.0–36.0)
MCV: 72.3 fL — ABNORMAL LOW (ref 80.0–100.0)
Monocytes Absolute: 0.5 10*3/uL (ref 0.1–1.0)
Monocytes Relative: 4 %
Neutro Abs: 12.5 10*3/uL — ABNORMAL HIGH (ref 1.7–7.7)
Neutrophils Relative %: 89 %
Platelets: 303 10*3/uL (ref 150–400)
RBC: 4.7 MIL/uL (ref 3.87–5.11)
RDW: 18.9 % — ABNORMAL HIGH (ref 11.5–15.5)
WBC: 14 10*3/uL — ABNORMAL HIGH (ref 4.0–10.5)
nRBC: 0 % (ref 0.0–0.2)

## 2020-08-25 LAB — URINE DRUG SCREEN, QUALITATIVE (ARMC ONLY)
Amphetamines, Ur Screen: NOT DETECTED
Barbiturates, Ur Screen: NOT DETECTED
Benzodiazepine, Ur Scrn: NOT DETECTED
Cannabinoid 50 Ng, Ur ~~LOC~~: POSITIVE — AB
Cocaine Metabolite,Ur ~~LOC~~: NOT DETECTED
MDMA (Ecstasy)Ur Screen: NOT DETECTED
Methadone Scn, Ur: NOT DETECTED
Opiate, Ur Screen: NOT DETECTED
Phencyclidine (PCP) Ur S: NOT DETECTED
Tricyclic, Ur Screen: POSITIVE — AB

## 2020-08-25 LAB — LACTIC ACID, PLASMA: Lactic Acid, Venous: 1.6 mmol/L (ref 0.5–1.9)

## 2020-08-25 LAB — PROCALCITONIN: Procalcitonin: <0.1 ng/mL

## 2020-08-25 LAB — BRAIN NATRIURETIC PEPTIDE: B Natriuretic Peptide: 24.3 pg/mL (ref 0.0–100.0)

## 2020-08-25 MED ORDER — ROCURONIUM BROMIDE 50 MG/5ML IV SOLN
70.0000 mg | Freq: Once | INTRAVENOUS | Status: DC
  Filled 2020-08-25: qty 7

## 2020-08-25 MED ORDER — SODIUM CHLORIDE 0.9 % IV SOLN
60.0000 mg/kg | Freq: Once | INTRAVENOUS | Status: AC
  Administered 2020-08-25: 4280 mg via INTRAVENOUS
  Filled 2020-08-25: qty 42.8

## 2020-08-25 MED ORDER — LEVETIRACETAM IN NACL 1000 MG/100ML IV SOLN: 1000.0000 mg | Freq: Two times a day (BID) | INTRAVENOUS | Status: DC

## 2020-08-25 MED ORDER — MIDAZOLAM HCL 5 MG/5ML IJ SOLN
INTRAMUSCULAR | Status: AC
  Administered 2020-08-25: 20 mg via INTRAVENOUS
  Filled 2020-08-25: qty 5

## 2020-08-25 MED ORDER — FENTANYL 2500MCG IN NS 250ML (10MCG/ML) PREMIX INFUSION
INTRAVENOUS | Status: AC
  Administered 2020-08-25: 50 ug/h via INTRAVENOUS
  Filled 2020-08-25: qty 250

## 2020-08-25 MED ORDER — MIDAZOLAM HCL 5 MG/5ML IJ SOLN: 20.0000 mg | Freq: Once | INTRAMUSCULAR | Status: AC

## 2020-08-25 MED ORDER — FENTANYL BOLUS VIA INFUSION
50.0000 ug | INTRAVENOUS | Status: DC | PRN
  Filled 2020-08-25: qty 50

## 2020-08-25 MED ORDER — SUCCINYLCHOLINE CHLORIDE 20 MG/ML IJ SOLN
100.0000 mg | Freq: Once | INTRAMUSCULAR | Status: AC
  Administered 2020-08-25: 100 mg via INTRAVENOUS

## 2020-08-25 MED ORDER — FENTANYL CITRATE (PF) 100 MCG/2ML IJ SOLN: 50.0000 ug | Freq: Once | INTRAMUSCULAR | Status: DC

## 2020-08-25 MED ORDER — PROPOFOL 1000 MG/100ML IV EMUL
INTRAVENOUS | Status: AC
  Administered 2020-08-25: 10 ug/kg/min via INTRAVENOUS
  Filled 2020-08-25: qty 100

## 2020-08-25 MED ORDER — PROPOFOL 1000 MG/100ML IV EMUL: 0.0000 ug/kg/min | INTRAVENOUS | Status: DC

## 2020-08-25 MED ORDER — FENTANYL CITRATE (PF) 100 MCG/2ML IJ SOLN
100.0000 ug | Freq: Once | INTRAMUSCULAR | Status: AC
  Administered 2020-08-25: 100 ug via INTRAVENOUS

## 2020-08-25 MED ORDER — FENTANYL 2500MCG IN NS 250ML (10MCG/ML) PREMIX INFUSION: 25.0000 ug/h | INTRAVENOUS | Status: DC

## 2020-08-25 NOTE — ED Notes (Signed)
Pts son with pt. Son states the pt was at home and in the bathroom today when the sons father heard a thump and went in the bathroom to find the pt in the floor. Son also reports that the pt has been more lethargic over the past four days and fell off last step three days ago. Pt struck arm and knee when falling but pts son denies that pt hit head.

## 2020-08-25 NOTE — ED Notes (Signed)
Patient transported to CT 

## 2020-08-25 NOTE — ED Notes (Signed)
Report received from [place], [place]

## 2020-08-25 NOTE — ED Provider Notes (Signed)
[facility] Emergency Department Provider Note  ___________________________________________   Event Date/Time   First MD Initiated Contact with Patient 08/25/20 1231     (approximate)  I have reviewed the triage vital signs and the nursing notes.   HISTORY  Chief Complaint Seizures  Primary hx reported by son as well as niece who helps care for her  HPI Sandra Charles is a 65 y.o. female who arrives to the emergency department via EMS for evaluation of probable seizure.  Family reported to EMS a seizure of 20 minutes.  Patient has history of epilepsy for the last several years, currently treated with lamotrigine.  She had last seizure approximately 2 months ago reported by niece, was seen by neurology and had lamotrigine dosing increased to 100 twice daily.  This morning, her fianc heard a noise and found her on the floor with stool and urine in the bathroom.  He helped her to the bed, went to retrieve a shop vac to clean up and returned inside to find her actively seizing on the bed.  They then called 911.  They deny any known history of heart failure, deny recent illness symptoms, deny recent fever, states that she has not complained of any pain.         Past Medical History:  Diagnosis Date   Anxiety    Arthritis    "all over"   Chronic lower back pain    Depression    GERD (gastroesophageal reflux disease)    Headache    "weekly" (07/15/2015)   Hyperlipidemia    Hypertension    Mini stroke ([place])    "several since 05/2014" (07/15/2015)   Seizures ([employer]) dx'd 04/2015    Patient Active Problem List   Diagnosis Date Noted   Sepsis ([place]) 09/22/2018   Altered mental status 03/27/2018   HLD (hyperlipidemia) 10/05/2017   Seizure ([place])    UTI (urinary tract infection) 08/17/2015   Seizure disorder ([place]) 08/10/2015   Cannabis use disorder, moderate, dependence ([place]) 08/10/2015   Schizoaffective disorder ([place]) 08/10/2015   Abnormal finding on MRI of  brain    Hypokalemia 07/15/2015   Confusion    GERD (gastroesophageal reflux disease) 11/25/2014   Essential hypertension 11/25/2014    Past Surgical History:  Procedure Laterality Date   DILATION AND CURETTAGE OF UTERUS     LUMBAR PUNCTURE  07/14/2015   TUBAL LIGATION     VAGINAL HYSTERECTOMY      Prior to Admission medications   Medication Sig Start Date End Date Taking? Authorizing Provider  amLODipine (NORVASC) 10 MG tablet Take 10 mg by mouth daily. For high blood pressure   Yes [provider]  aspirin EC 81 MG tablet Take 81 mg by mouth every morning.    Yes [provider]  Cholecalciferol (VITAMIN D3) 5000 UNITS TABS Take 5,000 Units by mouth every morning.    Yes [provider]  cloNIDine (CATAPRES) 0.1 MG tablet Take 1 tablet (0.1 mg total) by mouth 2 (two) times daily. 07/15/15  Yes Wieting, Richard, MD  donepezil (ARICEPT) 10 MG tablet Take 10 mg by mouth at bedtime.   Yes [provider]  lamoTRIgine (LAMICTAL) 25 MG tablet Take 3 tablets (75 mg total) by mouth 2 (two) times daily. Patient taking differently: Take 100 mg by mouth 2 (two) times daily. 03/29/18  Yes Mayo, Pete Pelt, MD  pantoprazole (PROTONIX) 40 MG tablet Take 1 tablet (40 mg total) by mouth daily. Patient taking differently:  Take 40 mg by mouth every morning. 11/25/14  Yes Wellington Hampshire, MD  propranolol ER (INDERAL LA) 60 MG 24 hr capsule Take 60 mg by mouth daily. 09/15/17  Yes [provider]  QUEtiapine (SEROQUEL XR) 50 MG TB24 24 hr tablet Take 50 mg by mouth at bedtime.   Yes [provider]  rosuvastatin (CRESTOR) 20 MG tablet Take 20 mg by mouth at bedtime. For high cholesterol   Yes [provider]  sertraline (ZOLOFT) 100 MG tablet Take 100 mg by mouth daily. 08/30/17  Yes [provider]  vitamin B-12 (CYANOCOBALAMIN) 1000 MCG tablet Take 1,000 mcg by mouth daily.   Yes [provider]  nystatin (NYSTATIN) powder Apply  topically 2 (two) times daily.    [provider]    Allergies Lovastatin  Family History  Problem Relation Age of Onset   Hyperlipidemia Mother    Breast cancer Mother 29   Hypertension Mother    Heart attack Mother        age 70's   Hyperlipidemia Father    Hypertension Father    Heart attack Father 39   Breast cancer Sister 14    Social History Social History   Tobacco Use   Smoking status: Former    Packs/day: 1.00    Years: 5.00    Pack years: 5.00    Types: Cigarettes   Smokeless tobacco: Never   Tobacco comments:    "quit smoking cigarettes in the early 2000s"  Substance Use Topics   Alcohol use: Yes    Alcohol/week: 0.0 standard drinks    Comment: 07/15/2015 "nothing in the last few years"   Drug use: No    Review of Systems Constitutional: No fever/chills Eyes: No visual changes. ENT: No sore throat. Cardiovascular: Denies chest pain. Respiratory: Denies shortness of breath. Gastrointestinal: No abdominal pain.  No nausea, no vomiting.  No diarrhea.  No constipation. Genitourinary: Negative for dysuria. Musculoskeletal: Negative for back pain. Skin: Negative for rash. Neurological: Negative for headaches, focal weakness or numbness.   ____________________________________________   PHYSICAL EXAM:  VITAL SIGNS: ED Triage Vitals  Enc Vitals Group     BP 08/25/20 1241 (!) 156/72     Pulse Rate 08/25/20 1241 91     Resp 08/25/20 1241 18     Temp 08/25/20 1256 98.4 F (36.9 C)     Temp Source 08/25/20 1256 Oral     SpO2 08/25/20 1241 95 %     Weight 08/25/20 1256 157 lb 3 oz (71.3 kg)     Height 08/25/20 1256 5' (1.524 m)     Head Circumference --      Peak Flow --      Pain Score 08/25/20 1256 0     Pain Loc --      Pain Edu? --      Excl. in [place]? --    Constitutional: Alert and oriented to self, not oriented to time or situation. Well appearing and in no acute distress. Eyes: Conjunctivae are normal. PERRL. EOMI. Head:  Atraumatic. Nose: No congestion/rhinnorhea. Mouth/Throat: Mucous membranes are moist.   Neck: No stridor.  No tenderness to palpation of the midline or paraspinals of the cervical spine. Cardiovascular: Normal rate, regular rhythm. Grossly normal heart sounds.  Good peripheral circulation. Respiratory: Normal respiratory effort.  No retractions. Lungs CTAB. Gastrointestinal: Soft and nontender. No distention. No abdominal bruits. No CVA tenderness. Musculoskeletal: No lower extremity tenderness nor edema.  No joint effusions. Neurologic: Mildly  postictal appearing.  Does respond to questions appropriately but is lethargic appearing. No gross focal neurologic deficits are appreciated.  Skin:  Skin is warm, dry and intact. No rash noted. Psychiatric: Mood and affect are normal. Speech and behavior are normal.  ____________________________________________   LABS (all labs ordered are listed, but only abnormal results are displayed)  Labs Reviewed  CBC WITH DIFFERENTIAL/PLATELET - Abnormal; Notable for the following components:      Result Value   WBC 14.0 (*)    Hemoglobin 10.3 (*)    HCT 34.0 (*)    MCV 72.3 (*)    MCH 21.9 (*)    RDW 18.9 (*)    Neutro Abs 12.5 (*)    Abs Immature Granulocytes 0.08 (*)    All other components within normal limits  COMPREHENSIVE METABOLIC PANEL - Abnormal; Notable for the following components:   Glucose, Bld 131 (*)    AST 86 (*)    ALT 66 (*)    All other components within normal limits  URINALYSIS, COMPLETE (UACMP) WITH MICROSCOPIC - Abnormal; Notable for the following components:   Color, Urine YELLOW (*)    APPearance CLOUDY (*)    Hgb urine dipstick SMALL (*)    Protein, ur 30 (*)    Bacteria, UA MANY (*)    All other components within normal limits  URINE DRUG SCREEN, QUALITATIVE ([facility] ONLY) - Abnormal; Notable for the following components:   Tricyclic, Ur Screen POSITIVE (*)    Cannabinoid 50 Ng, Ur  POSITIVE (*)    All other  components within normal limits  BLOOD GAS, ARTERIAL - Abnormal; Notable for the following components:   pO2, Arterial 121 (*)    Allens test (pass/fail) POSITIVE (*)    All other components within normal limits  RESP PANEL BY RT-PCR (FLU A&B, COVID) ARPGX2  CULTURE, BLOOD (ROUTINE X 2)  CULTURE, BLOOD (ROUTINE X 2)  PROCALCITONIN  BRAIN NATRIURETIC PEPTIDE  LACTIC ACID, PLASMA  LAMOTRIGINE LEVEL  TRIGLYCERIDES  LACTIC ACID, PLASMA   ____________________________________________  EKG  Normal sinus rhythm with a rate of 92 bpm.  No ST elevation or depression. ____________________________________________  RADIOLOGY Lenoria Farrier, personally viewed and evaluated these images (plain radiographs) as part of my medical decision making, as well as reviewing the written report by the radiologist.  ED MD interpretation: Chest x-ray reveals lower lobe edema versus infiltrate.  Repeat chest x-ray noted to show increasing central airspace disease likely related to pulmonary edema.  Official radiology report(s): DG Chest 1 View  Result Date: 08/25/2020 CLINICAL DATA:  Check endotracheal tube placement EXAM: CHEST  1 VIEW COMPARISON:  Film from earlier in the same day. FINDINGS: Cardiac shadow is stable. Gastric catheter extends into the stomach. Endotracheal tube is noted 2.3 cm above the carina. Increasing central airspace opacity is noted likely related to worsening edema. No sizable effusion is seen. No bony abnormality is noted. IMPRESSION: Tubes and lines as described above. Increasing central airspace opacity likely related to pulmonary edema. Electronically Signed   By: Inez Catalina M.D.   On: 08/25/2020 16:41   DG Abdomen 1 View  Result Date: 08/25/2020 CLINICAL DATA:  Check gastric catheter placement EXAM: ABDOMEN - 1 VIEW COMPARISON:  None. FINDINGS: Gastric catheter is noted in the stomach. Scattered large and small bowel gas is noted. No obstructive changes are seen.  IMPRESSION: Gastric catheter within the stomach. Electronically Signed   By: Inez Catalina M.D.   On: 08/25/2020 16:42  CT Head Wo Contrast  Result Date: 08/25/2020 CLINICAL DATA:  Nontraumatic seizure activity EXAM: CT HEAD WITHOUT CONTRAST TECHNIQUE: Contiguous axial images were obtained from the base of the skull through the vertex without intravenous contrast. COMPARISON:  09/22/2018 FINDINGS: Brain: No evidence of acute infarction, hemorrhage, hydrocephalus, extra-axial collection or mass lesion/mass effect. Vascular: No hyperdense vessel or unexpected calcification. Skull: Normal. Negative for fracture or focal lesion. Sinuses/Orbits: No acute finding. Other: None. IMPRESSION: No acute intracranial abnormality noted. Electronically Signed   By: Inez Catalina M.D.   On: 08/25/2020 15:55   DG Chest Portable 1 View  Result Date: 08/25/2020 CLINICAL DATA:  Weakness. EXAM: PORTABLE CHEST 1 VIEW COMPARISON:  09/22/2018. FINDINGS: Mild cardiomegaly. Chest. Diffuse bilateral pulmonary infiltrates/edema. No pleural effusion or pneumothorax. No acute bony abnormality. IMPRESSION: Mild cardiomegaly. Low lung volumes. Diffuse bilateral pulmonary infiltrates/edema. Electronically Signed   By: Marcello Moores  Register   On: 08/25/2020 13:18      ____________________________________________   INITIAL IMPRESSION / ASSESSMENT AND PLAN / ED COURSE  As part of my medical decision making, I reviewed the following data within the [place] History obtained from family, Nursing notes reviewed and incorporated, Labs reviewed, Patient signed out to Dr. Jari Pigg, Radiograph reviewed, Discussed with admitting physician, A consult was requested and obtained from this/these consultant(s) Neurology, Evaluated by EM attending Dr. Cheri Fowler, Dr. Jari Pigg, and Notes from prior ED visits        Patient is a 65 year old female who reports to the emergency department after reported seizure at home.  See HPI for further  details.  On arrival, patient appeared slightly postictal, with normal vital signs.  She was lethargic but arousable to her name and answered questions appropriately.  She declined to me any pain anywhere, declined any chest pain or shortness of breath.  Initial physical exam revealed normal breath sounds on auscultation, no abdominal pain or tenderness.  She remained lethargic but appropriate.   I attempted to reassess the patient approximately 15 minutes after initial assessment and by this time son had arrived to provide further history.  He reported to nursing that the patient did not know who he was and did not seem herself.  He also reports that she had been slow to get up over the last few days but denied any illness symptoms at home.  Initial work-up was obtained with urinalysis, UDS, CBC, CMP and chest x-ray.  Chest x-ray does show a possible pulmonary edema versus infiltrate pattern.  While awaiting the work-up, the patient became increasingly altered, not responsive to her name or to touch but was looking around the room seemingly confused.  She was then noted to be having hypoxia in the low 80s on 2 L by nasal cannula and was not managing her secretions on her own.  Dr. Cheri Fowler was immediately notified and she continued to be altered with worsening oxygenation.  Thus, the patient was intubated.  Following intubation, patient's oxygenation did improve and she was noted to become low-grade febrile with a temperature of 100.1.  Thus further laboratory evaluation was added.  ICU at [facility] was consulted, but believes the patient would be safer at an ICU with 24-hour EEG monitoring available.  Thus, discussed the case with on-call neurology, Dr. Cheral Marker to assist with facilitation of transfer to the neuro ICU at [facility].  He has come and evaluated the patient and agreed with necessity for transfer.  Patient's case has been discussed with Neuro ICU team at [facility] who accepts  the patient for transfer.  Accepting will be Dr. Patsey Berthold. Patient was temporarily handed off to Dr. Jari Pigg who will monitor the patient until transport arrives.   Clinical Course as of 08/25/20 2037  Thu Aug 25, 2020  2035 Spoke with Neuro ICU at [facility], accepting will be Dr. Patsey Berthold [CR]    Clinical Course User Index [CR] Marlana Salvage, PA     ____________________________________________   FINAL CLINICAL IMPRESSION(S) / ED DIAGNOSES  Final diagnoses:  Acute respiratory failure with hypoxia ([place])  Seizure ([place])  Acute pulmonary edema [facility])     ED Discharge Orders     None        Note:  This document was prepared using Dragon voice recognition software and may include unintentional dictation errors.    Marlana Salvage, PA 08/25/20 2039    Naaman Plummer, MD 08/26/20 [phone]

## 2020-08-25 NOTE — ED Notes (Addendum)
Patient noted to be altered at this time. O2 levels 82% on 2L. MD bradler made aware. Preparing to intubate

## 2020-08-25 NOTE — ED Notes (Signed)
Phlebotomy paged for lab draw.

## 2020-08-25 NOTE — Consult Note (Signed)
NEURO HOSPITALIST CONSULT NOTE   Requesting physician: Dennison Nancy, PA  Reason for Consult: Breakthrough seizures with persisting AMS  History obtained from:  Family and Chart     HPI:                                                                                                                                          Sandra Charles is an 65 y.o. female with a PMHx of memory dysfunction (has been seen by Neurology for this with second opinion at [facility]), anxiety, arthritis, chronic cannabis use, depression, headaches, HLD, HTN, ministroke and seizures (5 year history) who presents to the ED after 2 breakthrough seizures at home. The first seizure was unwitnessed but was felt to be the most likely explanation by her fiance after he found her disoriented in the bathroom after hearing a noise, with urine on the floor and "feces everywhere". He brought her to bed and she then had a witnessed GTC seizure lasting for about 20 minutes. EMS was called and she was brought to the ED. She is on Lamictal at home. She has several seizures per year, according to family, but has been seizure free for stretches of up to 4-5 months. No recent symptoms of illness and has been compliant with medication.   In the ED, the patient was initially conversant but then became confused again. She then became increasingly altered after obtaining a CXR, which showed  a possible pulmonary edema versus infiltrate pattern. She then became hypoxic and was not managing secretions on her own. Oxygenation continued to worsen and she was then intubated. She also started to have a low grade fever of 100.1.   Currently on low-dose propofol and medium-dose fentanyl gtt at the time of neurology assessment.    On Lamictal 100 mg po BID at home.   Past Medical History:  Diagnosis Date   Anxiety    Arthritis    "all over"   Chronic lower back pain    Depression    GERD (gastroesophageal reflux disease)     Headache    "weekly" (07/15/2015)   Hyperlipidemia    Hypertension    Mini stroke ([place])    "several since 05/2014" (07/15/2015)   Seizures ([place]) dx'd 04/2015    Past Surgical History:  Procedure Laterality Date   DILATION AND CURETTAGE OF UTERUS     LUMBAR PUNCTURE  07/14/2015   TUBAL LIGATION     VAGINAL HYSTERECTOMY      Family History  Problem Relation Age of Onset   Hyperlipidemia Mother    Breast cancer Mother 38   Hypertension Mother    Heart attack Mother        age 76's   Hyperlipidemia Father    Hypertension Father  Heart attack Father 4   Breast cancer Sister 45              Social History:  reports that she has quit smoking. Her smoking use included cigarettes. She has a 5.00 pack-year smoking history. She has never used smokeless tobacco. She reports current alcohol use. She reports that she does not use drugs.  Allergies  Allergen Reactions   Lovastatin Other (See Comments)    Unknown reaction    MEDICATIONS:                                                                                                                     No current facility-administered medications on file prior to encounter.   Current Outpatient Medications on File Prior to Encounter  Medication Sig Dispense Refill   amLODipine (NORVASC) 10 MG tablet Take 10 mg by mouth daily. For high blood pressure     aspirin EC 81 MG tablet Take 81 mg by mouth every morning.      Cholecalciferol (VITAMIN D3) 5000 UNITS TABS Take 5,000 Units by mouth every morning.      cloNIDine (CATAPRES) 0.1 MG tablet Take 1 tablet (0.1 mg total) by mouth 2 (two) times daily. 60 tablet 11   donepezil (ARICEPT) 10 MG tablet Take 10 mg by mouth at bedtime.     lamoTRIgine (LAMICTAL) 25 MG tablet Take 3 tablets (75 mg total) by mouth 2 (two) times daily. (Patient taking differently: Take 100 mg by mouth 2 (two) times daily.) 60 tablet 0   pantoprazole (PROTONIX) 40 MG tablet Take 1 tablet (40 mg total) by mouth daily.  (Patient taking differently: Take 40 mg by mouth every morning.) 30 tablet 11   propranolol ER (INDERAL LA) 60 MG 24 hr capsule Take 60 mg by mouth daily.  11   QUEtiapine (SEROQUEL XR) 50 MG TB24 24 hr tablet Take 50 mg by mouth at bedtime.     rosuvastatin (CRESTOR) 20 MG tablet Take 20 mg by mouth at bedtime. For high cholesterol     sertraline (ZOLOFT) 100 MG tablet Take 100 mg by mouth daily.  2   vitamin B-12 (CYANOCOBALAMIN) 1000 MCG tablet Take 1,000 mcg by mouth daily.     nystatin (NYSTATIN) powder Apply topically 2 (two) times daily.       Scheduled:  fentaNYL (SUBLIMAZE) injection  50 mcg Intravenous Once   Continuous:  fentaNYL infusion INTRAVENOUS 50 mcg/hr (08/25/20 1715)   propofol (DIPRIVAN) infusion 10 mcg/kg/min (08/25/20 1715)     ROS:  Unable to obtain due to AMS.    Blood pressure (!) 152/72, pulse 74, temperature 100.1 F (37.8 C), resp. rate 16, height 5' (1.524 m), weight 71.3 kg, SpO2 100 %.   General Examination:                                                                                                       Physical Exam  HEENT-  [place]/AT   Lungs- Intubated Extremities- Warm and well perfused  Neurological Examination Mental Status: Sedated and intubated. Will open eyes to loud verbal stimuli or touch, but quickly falls asleep again. Generally does not fixate for any extended period, with roving EOM noted. Did follow command to squeeze examiner's hands and wiggle toes. Grimaced and flailed extremities in response to noxious stimuli.  Cranial Nerves: II: Blinks to threat in right and left temporal fields. Left pupil pinpoint, right pupil 2 mm and reactive.   III,IV, VI: Mild exotropia. Able to gaze horizontally to left and right to command as well as when tracking examiner as he walks from side to side.  V,VII: Reacts  to brow ridge pressure bilaterally. Grimace is grossly symmetric. VIII: hearing intact to some loud commands IX,X: Intubated XI: Head is midline XII: Intubated Motor/Sensory: Flails upper and lower extremities with equal strength to noxious stimuli x 4. Does not follow commands for detailed motor exam.  Deep Tendon Reflexes: 1+ and symmetric brachioradialis and patellae Plantars: Equivocal bilaterally  Cerebellar/Gait: Unable to assess   Lab Results: Basic Metabolic Panel: Recent Labs  Lab 08/25/20 1230  NA 138  K 4.0  CL 105  CO2 24  GLUCOSE 131*  BUN 13  CREATININE 0.95  CALCIUM 9.2    CBC: Recent Labs  Lab 08/25/20 1230  WBC 14.0*  NEUTROABS 12.5*  HGB 10.3*  HCT 34.0*  MCV 72.3*  PLT 303    Cardiac Enzymes: No results for input(s): CKTOTAL, CKMB, CKMBINDEX, TROPONINI in the last 168 hours.  Lipid Panel: No results for input(s): CHOL, TRIG, HDL, CHOLHDL, VLDL, LDLCALC in the last 168 hours.  Imaging: DG Chest 1 View  Result Date: 08/25/2020 CLINICAL DATA:  Check endotracheal tube placement EXAM: CHEST  1 VIEW COMPARISON:  Film from earlier in the same day. FINDINGS: Cardiac shadow is stable. Gastric catheter extends into the stomach. Endotracheal tube is noted 2.3 cm above the carina. Increasing central airspace opacity is noted likely related to worsening edema. No sizable effusion is seen. No bony abnormality is noted. IMPRESSION: Tubes and lines as described above. Increasing central airspace opacity likely related to pulmonary edema. Electronically Signed   By: Inez Catalina M.D.   On: 08/25/2020 16:41   DG Abdomen 1 View  Result Date: 08/25/2020 CLINICAL DATA:  Check gastric catheter placement EXAM: ABDOMEN - 1 VIEW COMPARISON:  None. FINDINGS: Gastric catheter is noted in the stomach. Scattered large and small bowel gas is noted. No obstructive changes are seen. IMPRESSION: Gastric catheter within the stomach. Electronically Signed   By: Inez Catalina M.D.    On: 08/25/2020 16:42   CT Head  Wo Contrast  Result Date: 08/25/2020 CLINICAL DATA:  Nontraumatic seizure activity EXAM: CT HEAD WITHOUT CONTRAST TECHNIQUE: Contiguous axial images were obtained from the base of the skull through the vertex without intravenous contrast. COMPARISON:  09/22/2018 FINDINGS: Brain: No evidence of acute infarction, hemorrhage, hydrocephalus, extra-axial collection or mass lesion/mass effect. Vascular: No hyperdense vessel or unexpected calcification. Skull: Normal. Negative for fracture or focal lesion. Sinuses/Orbits: No acute finding. Other: None. IMPRESSION: No acute intracranial abnormality noted. Electronically Signed   By: Inez Catalina M.D.   On: 08/25/2020 15:55   DG Chest Portable 1 View  Result Date: 08/25/2020 CLINICAL DATA:  Weakness. EXAM: PORTABLE CHEST 1 VIEW COMPARISON:  09/22/2018. FINDINGS: Mild cardiomegaly. Chest. Diffuse bilateral pulmonary infiltrates/edema. No pleural effusion or pneumothorax. No acute bony abnormality. IMPRESSION: Mild cardiomegaly. Low lung volumes. Diffuse bilateral pulmonary infiltrates/edema. Electronically Signed   By: Marcello Moores  Register   On: 08/25/2020 13:18    Assessment: 65 year old female presenting after 2 breakthrough seizures at home. Had respiratory decompensation in the ED requiring intubation. After an initial period of being able to converse, she has been with persistent worsened confusion/AMS, which started before intubation. She has been loaded with 60 mg/kg Keppra.  1. Exam reveals an encephalopathic patient with symmetric motor function. A component of her AMS is due to her sedation, but AMS began prior to sedation and also continues to worsen.  2. No clinical seizure activity seen at the time of this Neurological assessment.  3. DDx for her AMS includes metabolic encephalopathy and subclinical status epilepticus.   Recommendations: 1. Transfer to [facility] ICU for LTM EEG monitoring and vent management.  2. Continue  Keppra at 1000 mg IV BID. Note that she is not on this medication at home.   3. Inpatient seizure precautions.  4. Continue Lamictal at 100 mg BID per tube.  5. Lamictal level (ordered)  45 minutes spent in the neurological evaluation and management of this critically ill patient.   Electronically signed: Dr. Kerney Elbe 08/25/2020, 7:35 PM

## 2020-08-25 NOTE — ED Triage Notes (Signed)
Pt arrives via [facility]. Pt was found by friend "half way on the bed and half way off." Friend reported pt having seizure-like activity for "20 minutes" and said pt had hx of seizures. [facility] VS: 165/88; HR 92; CO2 42. Pt yelling out with any movement at this time. Alert to self and birthday at this time. Pt inconsistantly lethargic with verbal response.

## 2020-08-25 NOTE — ED Notes (Signed)
EMTALA reviewed by this RN.  Transfer consent signed. ?

## 2020-08-26 ENCOUNTER — Inpatient Hospital Stay (HOSPITAL_COMMUNITY): Payer: Medicare HMO

## 2020-08-26 ENCOUNTER — Inpatient Hospital Stay (HOSPITAL_COMMUNITY)
Admission: AD | Admit: 2020-08-26 | Discharge: 2020-08-31 | DRG: 100 | Disposition: A | Discharge status: Home / Self Care | Payer: Medicare HMO | Source: Other Acute Inpatient Hospital | Attending: Internal Medicine | Admitting: Internal Medicine

## 2020-08-26 DIAGNOSIS — F419 Anxiety disorder, unspecified: Secondary | ICD-10-CM | POA: Diagnosis not present

## 2020-08-26 DIAGNOSIS — F1220 Cannabis dependence, uncomplicated: Secondary | ICD-10-CM | POA: Diagnosis present

## 2020-08-26 DIAGNOSIS — M545 Low back pain, unspecified: Secondary | ICD-10-CM | POA: Diagnosis present

## 2020-08-26 DIAGNOSIS — F259 Schizoaffective disorder, unspecified: Secondary | ICD-10-CM | POA: Diagnosis present

## 2020-08-26 DIAGNOSIS — J9691 Respiratory failure, unspecified with hypoxia: Secondary | ICD-10-CM | POA: Diagnosis not present

## 2020-08-26 DIAGNOSIS — Z8673 Personal history of transient ischemic attack (TIA), and cerebral infarction without residual deficits: Secondary | ICD-10-CM | POA: Diagnosis not present

## 2020-08-26 DIAGNOSIS — Z6835 Body mass index (BMI) 35.0-35.9, adult: Secondary | ICD-10-CM | POA: Diagnosis not present

## 2020-08-26 DIAGNOSIS — M1990 Unspecified osteoarthritis, unspecified site: Secondary | ICD-10-CM | POA: Diagnosis present

## 2020-08-26 DIAGNOSIS — Z83438 Family history of other disorder of lipoprotein metabolism and other lipidemia: Secondary | ICD-10-CM | POA: Diagnosis not present

## 2020-08-26 DIAGNOSIS — E876 Hypokalemia: Secondary | ICD-10-CM | POA: Diagnosis not present

## 2020-08-26 DIAGNOSIS — G40909 Epilepsy, unspecified, not intractable, without status epilepticus: Principal | ICD-10-CM | POA: Diagnosis present

## 2020-08-26 DIAGNOSIS — I5031 Acute diastolic (congestive) heart failure: Secondary | ICD-10-CM

## 2020-08-26 DIAGNOSIS — G8929 Other chronic pain: Secondary | ICD-10-CM | POA: Diagnosis present

## 2020-08-26 DIAGNOSIS — Z1389 Encounter for screening for other disorder: Secondary | ICD-10-CM

## 2020-08-26 DIAGNOSIS — T17908A Unspecified foreign body in respiratory tract, part unspecified causing other injury, initial encounter: Secondary | ICD-10-CM

## 2020-08-26 DIAGNOSIS — R140 Abdominal distension (gaseous): Secondary | ICD-10-CM

## 2020-08-26 DIAGNOSIS — D509 Iron deficiency anemia, unspecified: Secondary | ICD-10-CM | POA: Diagnosis present

## 2020-08-26 DIAGNOSIS — J690 Pneumonitis due to inhalation of food and vomit: Secondary | ICD-10-CM | POA: Diagnosis present

## 2020-08-26 DIAGNOSIS — G9341 Metabolic encephalopathy: Secondary | ICD-10-CM | POA: Diagnosis not present

## 2020-08-26 DIAGNOSIS — N179 Acute kidney failure, unspecified: Secondary | ICD-10-CM | POA: Diagnosis present

## 2020-08-26 DIAGNOSIS — Z803 Family history of malignant neoplasm of breast: Secondary | ICD-10-CM

## 2020-08-26 DIAGNOSIS — G3183 Dementia with Lewy bodies: Secondary | ICD-10-CM | POA: Diagnosis present

## 2020-08-26 DIAGNOSIS — F05 Delirium due to known physiological condition: Secondary | ICD-10-CM | POA: Diagnosis not present

## 2020-08-26 DIAGNOSIS — K219 Gastro-esophageal reflux disease without esophagitis: Secondary | ICD-10-CM | POA: Diagnosis present

## 2020-08-26 DIAGNOSIS — R7401 Elevation of levels of liver transaminase levels: Secondary | ICD-10-CM | POA: Diagnosis present

## 2020-08-26 DIAGNOSIS — E669 Obesity, unspecified: Secondary | ICD-10-CM | POA: Diagnosis present

## 2020-08-26 DIAGNOSIS — J9601 Acute respiratory failure with hypoxia: Secondary | ICD-10-CM | POA: Diagnosis present

## 2020-08-26 DIAGNOSIS — Z4659 Encounter for fitting and adjustment of other gastrointestinal appliance and device: Secondary | ICD-10-CM

## 2020-08-26 DIAGNOSIS — I959 Hypotension, unspecified: Secondary | ICD-10-CM | POA: Diagnosis present

## 2020-08-26 DIAGNOSIS — Z90710 Acquired absence of both cervix and uterus: Secondary | ICD-10-CM

## 2020-08-26 DIAGNOSIS — Z79899 Other long term (current) drug therapy: Secondary | ICD-10-CM

## 2020-08-26 DIAGNOSIS — Z7982 Long term (current) use of aspirin: Secondary | ICD-10-CM

## 2020-08-26 DIAGNOSIS — J810 Acute pulmonary edema: Secondary | ICD-10-CM | POA: Diagnosis not present

## 2020-08-26 DIAGNOSIS — I10 Essential (primary) hypertension: Secondary | ICD-10-CM | POA: Diagnosis present

## 2020-08-26 DIAGNOSIS — F32A Depression, unspecified: Secondary | ICD-10-CM | POA: Diagnosis present

## 2020-08-26 DIAGNOSIS — Z8249 Family history of ischemic heart disease and other diseases of the circulatory system: Secondary | ICD-10-CM

## 2020-08-26 DIAGNOSIS — F0280 Dementia in other diseases classified elsewhere without behavioral disturbance: Secondary | ICD-10-CM | POA: Diagnosis present

## 2020-08-26 DIAGNOSIS — Z889 Allergy status to unspecified drugs, medicaments and biological substances status: Secondary | ICD-10-CM

## 2020-08-26 DIAGNOSIS — J9602 Acute respiratory failure with hypercapnia: Secondary | ICD-10-CM | POA: Diagnosis present

## 2020-08-26 DIAGNOSIS — E7800 Pure hypercholesterolemia, unspecified: Secondary | ICD-10-CM | POA: Diagnosis present

## 2020-08-26 DIAGNOSIS — Z87891 Personal history of nicotine dependence: Secondary | ICD-10-CM

## 2020-08-26 DIAGNOSIS — R569 Unspecified convulsions: Secondary | ICD-10-CM

## 2020-08-26 DIAGNOSIS — R0602 Shortness of breath: Secondary | ICD-10-CM

## 2020-08-26 LAB — POCT I-STAT 7, (LYTES, BLD GAS, ICA,H+H)
Acid-Base Excess: 2 mmol/L (ref 0.0–2.0)
Bicarbonate: 28.1 mmol/L — ABNORMAL HIGH (ref 20.0–28.0)
Calcium, Ion: 1.28 mmol/L (ref 1.15–1.40)
HCT: 31 % — ABNORMAL LOW (ref 36.0–46.0)
Hemoglobin: 10.5 g/dL — ABNORMAL LOW (ref 12.0–15.0)
O2 Saturation: 98 %
Patient temperature: 37.2
Potassium: 4 mmol/L (ref 3.5–5.1)
Sodium: 139 mmol/L (ref 135–145)
TCO2: 30 mmol/L (ref 22–32)
pCO2 arterial: 52.1 mmHg — ABNORMAL HIGH (ref 32.0–48.0)
pH, Arterial: 7.341 — ABNORMAL LOW (ref 7.350–7.450)
pO2, Arterial: 110 mmHg — ABNORMAL HIGH (ref 83.0–108.0)

## 2020-08-26 LAB — CBC
HCT: 32.7 % — ABNORMAL LOW (ref 36.0–46.0)
Hemoglobin: 9.5 g/dL — ABNORMAL LOW (ref 12.0–15.0)
MCH: 21.3 pg — ABNORMAL LOW (ref 26.0–34.0)
MCHC: 29.1 g/dL — ABNORMAL LOW (ref 30.0–36.0)
MCV: 73.2 fL — ABNORMAL LOW (ref 80.0–100.0)
Platelets: 273 10*3/uL (ref 150–400)
RBC: 4.47 MIL/uL (ref 3.87–5.11)
RDW: 18.9 % — ABNORMAL HIGH (ref 11.5–15.5)
WBC: 14.4 10*3/uL — ABNORMAL HIGH (ref 4.0–10.5)
nRBC: 0 % (ref 0.0–0.2)

## 2020-08-26 LAB — BASIC METABOLIC PANEL
Anion gap: 11 (ref 5–15)
BUN: 14 mg/dL (ref 8–23)
CO2: 24 mmol/L (ref 22–32)
Calcium: 9.1 mg/dL (ref 8.9–10.3)
Chloride: 103 mmol/L (ref 98–111)
Creatinine, Ser: 1.02 mg/dL — ABNORMAL HIGH (ref 0.44–1.00)
GFR, Estimated: >60 mL/min (ref >60)
Glucose, Bld: 114 mg/dL — ABNORMAL HIGH (ref 70–99)
Potassium: 3.8 mmol/L (ref 3.5–5.1)
Sodium: 138 mmol/L (ref 135–145)

## 2020-08-26 LAB — ECHOCARDIOGRAM COMPLETE
AR max vel: 1.41 cm2
AV Area VTI: 1.46 cm2
AV Area mean vel: 1.43 cm2
AV Mean grad: 6 mmHg
AV Peak grad: 11.3 mmHg
Ao pk vel: 1.68 m/s
Area-P 1/2: 3.53 cm2
Height: 62 in
S' Lateral: 3 cm

## 2020-08-26 LAB — PHOSPHORUS: Phosphorus: 4.7 mg/dL — ABNORMAL HIGH (ref 2.5–4.6)

## 2020-08-26 LAB — HIV ANTIBODY (ROUTINE TESTING W REFLEX): HIV Screen 4th Generation wRfx: NONREACTIVE

## 2020-08-26 LAB — LAMOTRIGINE LEVEL: Lamotrigine Lvl: <1 ug/mL — ABNORMAL LOW (ref 2.0–20.0)

## 2020-08-26 LAB — MRSA NEXT GEN BY PCR, NASAL: MRSA by PCR Next Gen: NOT DETECTED

## 2020-08-26 LAB — MAGNESIUM: Magnesium: 2.3 mg/dL (ref 1.7–2.4)

## 2020-08-26 MED ORDER — ROSUVASTATIN CALCIUM 20 MG PO TABS
20.0000 mg | ORAL_TABLET | Freq: Every day | ORAL | Status: DC
  Administered 2020-08-26 – 2020-08-27 (×2): 20 mg via ORAL
  Filled 2020-08-26 (×2): qty 1

## 2020-08-26 MED ORDER — CHLORHEXIDINE GLUCONATE 0.12% ORAL RINSE (MEDLINE KIT)
15.0000 mL | Freq: Two times a day (BID) | OROMUCOSAL | Status: DC
  Administered 2020-08-26 (×2): 15 mL via OROMUCOSAL

## 2020-08-26 MED ORDER — DOCUSATE SODIUM 100 MG PO CAPS: 100.0000 mg | ORAL_CAPSULE | Freq: Two times a day (BID) | ORAL | Status: DC | PRN

## 2020-08-26 MED ORDER — FENTANYL CITRATE (PF) 100 MCG/2ML IJ SOLN: 50.0000 ug | INTRAMUSCULAR | Status: DC | PRN

## 2020-08-26 MED ORDER — DOCUSATE SODIUM 50 MG/5ML PO LIQD
100.0000 mg | Freq: Two times a day (BID) | ORAL | Status: DC
  Administered 2020-08-29 – 2020-08-31 (×5): 100 mg via ORAL
  Filled 2020-08-26 (×7): qty 10

## 2020-08-26 MED ORDER — PANTOPRAZOLE SODIUM 40 MG IV SOLR
40.0000 mg | Freq: Every day | INTRAVENOUS | Status: DC
  Administered 2020-08-26 (×2): 40 mg via INTRAVENOUS
  Filled 2020-08-26 (×2): qty 40

## 2020-08-26 MED ORDER — SODIUM CHLORIDE 0.9 % IV SOLN
250.0000 mL | INTRAVENOUS | Status: DC
  Administered 2020-08-26: 250 mL via INTRAVENOUS

## 2020-08-26 MED ORDER — POTASSIUM CHLORIDE 20 MEQ PO PACK
40.0000 meq | PACK | Freq: Every day | ORAL | Status: DC
  Administered 2020-08-26: 40 meq via ORAL
  Filled 2020-08-26: qty 2

## 2020-08-26 MED ORDER — ROSUVASTATIN CALCIUM 20 MG PO TABS: 20.0000 mg | ORAL_TABLET | Freq: Every day | ORAL | Status: DC

## 2020-08-26 MED ORDER — NOREPINEPHRINE 4 MG/250ML-% IV SOLN: 2.0000 ug/min | INTRAVENOUS | Status: DC

## 2020-08-26 MED ORDER — ASPIRIN 81 MG PO CHEW: 81.0000 mg | CHEWABLE_TABLET | Freq: Every day | ORAL | Status: DC

## 2020-08-26 MED ORDER — PROPOFOL 1000 MG/100ML IV EMUL
0.0000 ug/kg/min | INTRAVENOUS | Status: DC
  Administered 2020-08-26: 10 ug/kg/min via INTRAVENOUS
  Filled 2020-08-26: qty 100

## 2020-08-26 MED ORDER — LAMOTRIGINE 100 MG PO TABS
100.0000 mg | ORAL_TABLET | Freq: Every morning | ORAL | Status: DC
  Administered 2020-08-27 – 2020-08-31 (×5): 100 mg via ORAL
  Filled 2020-08-26 (×4): qty 1

## 2020-08-26 MED ORDER — GADOBUTROL 1 MMOL/ML IV SOLN
7.5000 mL | Freq: Once | INTRAVENOUS | Status: AC | PRN
  Administered 2020-08-26: 7.5 mL via INTRAVENOUS

## 2020-08-26 MED ORDER — DOCUSATE SODIUM 50 MG/5ML PO LIQD: 100.0000 mg | Freq: Two times a day (BID) | ORAL | Status: DC

## 2020-08-26 MED ORDER — LAMOTRIGINE 100 MG PO TABS
100.0000 mg | ORAL_TABLET | Freq: Two times a day (BID) | ORAL | Status: DC
  Administered 2020-08-26: 100 mg
  Filled 2020-08-26: qty 1

## 2020-08-26 MED ORDER — LAMOTRIGINE 100 MG PO TABS: 100.0000 mg | ORAL_TABLET | Freq: Two times a day (BID) | ORAL | Status: DC

## 2020-08-26 MED ORDER — LEVETIRACETAM IN NACL 1000 MG/100ML IV SOLN
1000.0000 mg | Freq: Two times a day (BID) | INTRAVENOUS | Status: DC
  Administered 2020-08-26 (×2): 1000 mg via INTRAVENOUS
  Filled 2020-08-26 (×2): qty 100

## 2020-08-26 MED ORDER — FUROSEMIDE 10 MG/ML IJ SOLN
40.0000 mg | Freq: Once | INTRAMUSCULAR | Status: AC
  Administered 2020-08-26: 40 mg via INTRAVENOUS
  Filled 2020-08-26: qty 4

## 2020-08-26 MED ORDER — HEPARIN SODIUM (PORCINE) 5000 UNIT/ML IJ SOLN
5000.0000 [IU] | Freq: Three times a day (TID) | INTRAMUSCULAR | Status: DC
  Administered 2020-08-26 – 2020-08-29 (×10): 5000 [IU] via SUBCUTANEOUS
  Filled 2020-08-26 (×12): qty 1

## 2020-08-26 MED ORDER — FENTANYL 2500MCG IN NS 250ML (10MCG/ML) PREMIX INFUSION
0.0000 ug/h | INTRAVENOUS | Status: DC
  Administered 2020-08-26: 75 ug/h via INTRAVENOUS

## 2020-08-26 MED ORDER — POLYETHYLENE GLYCOL 3350 17 G PO PACK: 17.0000 g | PACK | Freq: Every day | ORAL | Status: DC | PRN

## 2020-08-26 MED ORDER — DOCUSATE SODIUM 50 MG/5ML PO LIQD: 100.0000 mg | Freq: Two times a day (BID) | ORAL | Status: DC | PRN

## 2020-08-26 MED ORDER — POLYETHYLENE GLYCOL 3350 17 G PO PACK: 17.0000 g | PACK | Freq: Every day | ORAL | Status: DC

## 2020-08-26 MED ORDER — CHLORHEXIDINE GLUCONATE CLOTH 2 % EX PADS
6.0000 | MEDICATED_PAD | Freq: Every day | CUTANEOUS | Status: DC
  Administered 2020-08-26 – 2020-08-28 (×3): 6 via TOPICAL

## 2020-08-26 MED ORDER — ASPIRIN 81 MG PO CHEW
81.0000 mg | CHEWABLE_TABLET | Freq: Every day | ORAL | Status: DC
  Administered 2020-08-26 – 2020-08-31 (×6): 81 mg via ORAL
  Filled 2020-08-26 (×6): qty 1

## 2020-08-26 MED ORDER — SODIUM CHLORIDE 0.9 % IV SOLN
3.0000 g | Freq: Four times a day (QID) | INTRAVENOUS | Status: DC
  Administered 2020-08-26 – 2020-08-29 (×14): 3 g via INTRAVENOUS
  Filled 2020-08-26 (×2): qty 8
  Filled 2020-08-26: qty 3
  Filled 2020-08-26: qty 8
  Filled 2020-08-26 (×2): qty 3
  Filled 2020-08-26 (×2): qty 8
  Filled 2020-08-26 (×5): qty 3
  Filled 2020-08-26: qty 8
  Filled 2020-08-26 (×2): qty 3
  Filled 2020-08-26: qty 8

## 2020-08-26 MED ORDER — LAMOTRIGINE 25 MG PO TABS
125.0000 mg | ORAL_TABLET | Freq: Every day | ORAL | Status: DC
  Administered 2020-08-26 – 2020-08-30 (×5): 125 mg via ORAL
  Filled 2020-08-26 (×5): qty 1

## 2020-08-26 MED ORDER — LACTATED RINGERS IV SOLN: INTRAVENOUS | Status: DC

## 2020-08-26 MED ORDER — ORAL CARE MOUTH RINSE
15.0000 mL | OROMUCOSAL | Status: DC
  Administered 2020-08-26 (×4): 15 mL via OROMUCOSAL

## 2020-08-26 MED ORDER — POTASSIUM CHLORIDE 20 MEQ PO PACK: 40.0000 meq | PACK | Freq: Every day | ORAL | Status: DC

## 2020-08-26 NOTE — Progress Notes (Signed)
RT transported patient to MRI and back to 4N without any complications.  

## 2020-08-26 NOTE — Procedures (Signed)
Patient Name: Sandra Charles  MRN: [id number]  Epilepsy Attending: Lora Havens  Referring Physician/Provider: Dr Lesleigh Noe Date: 08/26/2020 Duration: 26.25 mins  Patient history:  65 year old female with history of epilepsy who presented with breakthrough seizures.  EEG to evaluate for seizures.  Level of alertness: Awake  AEDs during EEG study: Keppra, lamotrigine  Technical aspects: This EEG study was done with scalp electrodes positioned according to the 10-20 International system of electrode placement. Electrical activity was acquired at a sampling rate of 500Hz  and reviewed with a high frequency filter of 70Hz  and a low frequency filter of 1Hz . EEG data were recorded continuously and digitally stored.   Description: No posterior dominant rhythm was seen. EEG showed continuous generalized polymorphic 5 to 6 Hz theta as well as intermittent 2 to 3 Hz delta slowing. Hyperventilation and photic stimulation were not performed.     ABNORMALITY - Continuous slow, generalized  IMPRESSION: This study is suggestive of moderate diffuse encephalopathy, nonspecific etiology. No seizures or epileptiform discharges were seen throughout the recording.  [name]

## 2020-08-26 NOTE — Progress Notes (Addendum)
[facility] Progress Note Patient Name: Sandra Charles DOB: 09/20/1955 MRN: [id number]   Date of Service  08/26/2020  HPI/Events of Note  Patient admitted to [facility] following transfer from OSH, she has acute hypoxemic / hypercarbic respiratory failure s / p intubation, in the context of breakthrough seizure in patient with a history of seizure disorder.  eICU Interventions  New Patient Evaluation.        Kerry Kass Salote Weidmann 08/26/2020, 1:38 AM

## 2020-08-26 NOTE — Progress Notes (Signed)
EEG complete - results pending 

## 2020-08-26 NOTE — Progress Notes (Signed)
  Echocardiogram 2D Echocardiogram has been performed.  Sandra Charles 08/26/2020, 3:39 PM

## 2020-08-26 NOTE — H&P (Signed)
NAME:  Sandra Charles, MRN:  [id number], DOB:  Jul 12, 1955, LOS: 0 ADMISSION DATE:  08/26/2020, CONSULTATION DATE:  08/26/20 REFERRING MD:  Cheral Marker CHIEF COMPLAINT:  AMS, seizures, respiratory failure   History of Present Illness:  Sandra Charles is a 65 y.o. female who has PMH including but not limited to seizures, memory dysfunction, anxiety, chronic cannabis use, depression, headaches, HTN, HLD, TIA.  She presented to [facility] 6/30 after 2 seizures at home.  First was unwitnessed but husband felt her appearance afterwards was consistent with prior seizures.  Second was a witnessed GTC seizure lasting 20 minutes.  In ED, she was initially alert and oriented but later became confused.  She was also noted to be hypoxic and was not able to manage / clear her oral secretions.  She was subsequently intubated. She did not have any recurrent obvious seizure like activity in ED.  Hypoxia felt to be 2/2 flash pulmonary edema +/- aspiration.  She was seen by neurology who loaded her with Keppra, continued her home Lamictal, and recommenced transfer to [facility] for cEEG monitoring.  Pertinent  Medical History:  has GERD (gastroesophageal reflux disease); Essential hypertension; Confusion; Hypokalemia; Abnormal finding on MRI of brain; Seizure disorder ([place]); Cannabis use disorder, moderate, dependence ([place]); Schizoaffective disorder ([employer]); UTI (urinary tract infection); Seizure ([place]); HLD (hyperlipidemia); Altered mental status; Sepsis ([place]); and Seizures ([place]) on their problem list.  Significant Hospital Events: Including procedures, antibiotic start and stop dates in addition to other pertinent events   6/30 > presented to [facility], intubated, transferred to [facility].  Interim History / Subjective:  Comfortable on vent. Was on 25 Propofol, now on 5 as she became hypotensive en route. Opens eyes but does not follow commands.  Objective:  Blood pressure 132/85, pulse 67, temperature 98.6 F (37 C), resp. rate (!) 25, height 5'  2" (1.575 m), SpO2 100 %.    Vent Mode: PRVC FiO2 (%):  [35 %-50 %] 40 % Set Rate:  [16 bmp] 16 bmp Vt Set:  [400 mL-450 mL] 400 mL PEEP:  [5 cmH20] 5 cmH20 Plateau Pressure:  [24 cmH20] 24 cmH20  No intake or output data in the 24 hours ending 08/26/20 0054 There were no vitals filed for this visit.  Examination: General: Adult female, resting in bed, in NAD. Neuro: Sedated but opens eyes to voice, does not follow commands. HEENT: [place]/AT. Sclerae anicteric. ETT in place. Cardiovascular: RRR, no M/R/G.  Lungs: Respirations even and unlabored.  CTA bilaterally, No W/R/R.  Abdomen: BS x 4, soft, NT/ND.  Musculoskeletal: No gross deformities, no edema.  Skin: Intact, warm, no rashes.  Labs/imaging personally reviewed:  CT head 6/30 > neg. cEEG 7/1 >   Resolved Hospital Problem list:    Assessment & Plan:   Hx seizures. - Continue Keppra 1000mg  BID. - Continue home Lamictal. - Neuro aware of arrival to [facility]. - EEG and additional AED's per neuro.  Acute hypoxic respiratory failure with inability to protect airway - presumed 2/2 above. - Full vent support. - Wean as able. - Bronchial hygiene. - Follow CXR.  Presumed aspiration PNA - 2/2 above. - Empiric Unasyn. - Send tracheal aspirate.  Hx HTN, HLD. - Hold home Amlodipine, Clonidine, Propranolol. - Continue home ASA, Rosuvastatin.  Hx TIA, anxiety, depression, chronic lower back pain. - Hold home Donepezil, Quetiapine, Sertraline.  THC abuse. - Substance abuse counseling.  Best practice (evaluated daily):  Diet/type: NPO DVT prophylaxis: prophylactic heparin  GI prophylaxis: PPI Lines: N/A Foley:  N/A Code Status:  full code Last date of multidisciplinary goals of care discussion: None yet.  Labs   CBC: Recent Labs  Lab 08/25/20 1230  WBC 14.0*  NEUTROABS 12.5*  HGB 10.3*  HCT 34.0*  MCV 72.3*  PLT 696    Basic Metabolic Panel: Recent Labs  Lab 08/25/20 1230  NA 138  K 4.0  CL 105  CO2 24   GLUCOSE 131*  BUN 13  CREATININE 0.95  CALCIUM 9.2   GFR: Estimated Creatinine Clearance: 56.8 mL/min (by C-G formula based on SCr of 0.95 mg/dL). Recent Labs  Lab 08/25/20 1230 08/25/20 1940  PROCALCITON <0.10  --   WBC 14.0*  --   LATICACIDVEN  --  1.6    Liver Function Tests: Recent Labs  Lab 08/25/20 1230  AST 86*  ALT 66*  ALKPHOS 106  BILITOT 0.6  PROT 7.8  ALBUMIN 3.8   No results for input(s): LIPASE, AMYLASE in the last 168 hours. No results for input(s): AMMONIA in the last 168 hours.  ABG    Component Value Date/Time   PHART 7.35 08/25/2020 1511   PCO2ART 46 08/25/2020 1511   PO2ART 121 (H) 08/25/2020 1511   HCO3 25.4 08/25/2020 1511   ACIDBASEDEF 0.4 08/25/2020 1511   O2SAT 98.5 08/25/2020 1511     Coagulation Profile: No results for input(s): INR, PROTIME in the last 168 hours.  Cardiac Enzymes: No results for input(s): CKTOTAL, CKMB, CKMBINDEX, TROPONINI in the last 168 hours.  HbA1C: Hgb A1c MFr Bld  Date/Time Value Ref Range Status  08/10/2015 03:20 PM 6.1 (H) 4.0 - 6.0 % Final    CBG: No results for input(s): GLUCAP in the last 168 hours.  Review of Systems:   Unable to obtain as pt is encephalopathic.  Past Medical History:  She,  has a past medical history of Anxiety, Arthritis, Chronic lower back pain, Depression, GERD (gastroesophageal reflux disease), Headache, Hyperlipidemia, Hypertension, Mini stroke ([place]), and Seizures ([place]) (dx'd 04/2015).   Surgical History:   Past Surgical History:  Procedure Laterality Date   DILATION AND CURETTAGE OF UTERUS     LUMBAR PUNCTURE  07/14/2015   TUBAL LIGATION     VAGINAL HYSTERECTOMY       Social History:   reports that she has quit smoking. Her smoking use included cigarettes. She has a 5.00 pack-year smoking history. She has never used smokeless tobacco. She reports current alcohol use. She reports that she does not use drugs.   Family History:  Her family history includes Breast  cancer (age of onset: 45) in her sister; Breast cancer (age of onset: 37) in her mother; Heart attack in her mother; Heart attack (age of onset: 61) in her father; Hyperlipidemia in her father and mother; Hypertension in her father and mother.   Allergies Allergies  Allergen Reactions   Lovastatin Other (See Comments)    Unknown reaction     Home Medications  Prior to Admission medications   Medication Sig Start Date End Date Taking? Authorizing Provider  amLODipine (NORVASC) 10 MG tablet Take 10 mg by mouth daily. For high blood pressure    [provider]  aspirin EC 81 MG tablet Take 81 mg by mouth every morning.     [provider]  Cholecalciferol (VITAMIN D3) 5000 UNITS TABS Take 5,000 Units by mouth every morning.     [provider]  cloNIDine (CATAPRES) 0.1 MG tablet Take 1 tablet (0.1 mg total) by mouth 2 (two) times  daily. 07/15/15   Loletha Grayer, MD  donepezil (ARICEPT) 10 MG tablet Take 10 mg by mouth at bedtime.    [provider]  lamoTRIgine (LAMICTAL) 25 MG tablet Take 3 tablets (75 mg total) by mouth 2 (two) times daily. Patient taking differently: Take 100 mg by mouth 2 (two) times daily. 03/29/18   Mayo, Pete Pelt, MD  nystatin (NYSTATIN) powder Apply topically 2 (two) times daily.    [provider]  pantoprazole (PROTONIX) 40 MG tablet Take 1 tablet (40 mg total) by mouth daily. Patient taking differently: Take 40 mg by mouth every morning. 11/25/14   Wellington Hampshire, MD  propranolol ER (INDERAL LA) 60 MG 24 hr capsule Take 60 mg by mouth daily. 09/15/17   [provider]  QUEtiapine (SEROQUEL XR) 50 MG TB24 24 hr tablet Take 50 mg by mouth at bedtime.    [provider]  rosuvastatin (CRESTOR) 20 MG tablet Take 20 mg by mouth at bedtime. For high cholesterol    [provider]  sertraline (ZOLOFT) 100 MG tablet Take 100 mg by mouth daily. 08/30/17   [provider]  vitamin B-12  (CYANOCOBALAMIN) 1000 MCG tablet Take 1,000 mcg by mouth daily.    [provider]     Critical care time: 40 min.   Montey Hora, [facility] For pager details, please see AMION or use Epic chat  After 1900, please call [place] for cross coverage needs 08/26/2020, 12:54 AM

## 2020-08-26 NOTE — Procedures (Signed)
Extubation Procedure Note  Patient Details:   Name: Sandra Charles DOB: 05-16-1955 MRN: [id number]   Airway Documentation:    Vent end date: 08/26/20 Vent end time: 0850   Evaluation  O2 sats: stable throughout Complications: No apparent complications Patient did tolerate procedure well. Bilateral Breath Sounds: Clear, Diminished   Yes  RN at bedside, cuff leak noted. Pt placed on 3L [place] and is tolerating well. RT will continue to monitor.  Cathie Olden 08/26/2020, 10:49 AM

## 2020-08-26 NOTE — Progress Notes (Signed)
Patient extubated safely. Alert to self and drowsy. No complaints of pain. On 3L [place]. Follows commands. Moves all extremities.   15mL IV Fentanyl wasted in the med room with Amy RN. Jamaia Brum, Rande Brunt, RN

## 2020-08-26 NOTE — Progress Notes (Signed)
Patient was admitted with acute hypoxic respiratory failure in the setting of seizures and postictal phase She was loaded with Keppra Currently on Keppra and Lamictal Neurology is following  At this time patient is clinically stable, tolerating pressure support, watch for respiratory distress, will try to extubate her today.    Jacky Kindle MD [facility] See Amion for pager If no response to pager, please call [phone] until 7pm After 7pm, Please call E-link [phone]

## 2020-08-26 NOTE — Consult Note (Addendum)
Neurology Consultation Reason for Consult: Seizures Requesting Physician: Collier Bullock   CC: Seizures and persistent altered mental status  History is obtained from: chart review, family could not be reached overnight   HPI: Sandra Charles is a 65 y.o. 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  She was found by her fianc on the bathroom floor after he heard a noise with urine on the floor as well as fecal incontinence.  He brought her to bed and she then had a witnessed generalized tonic-clonic seizure lasting about 20 minutes.  Per Dr. Yvetta Coder notes, baseline seizure frequency is a few per year and she has been seizure-free for 4 to 5 months.  While in the ED she was initially conversant but then became confused again with chest x-ray demonstrating possible pulmonary edema versus infiltrate.  Due to hypoxia and secretions as well as increasing confusion she was intubated and also noted to be febrile to 100.2.  She was additionally loaded with Keppra 60 mg/kg  Dr. Yvetta Coder initial evaluation she was noted to be sedated and intubated, opening eyes to loud verbal stimuli or touch but quickly falling asleep again and not fixating for any extended period with roving eye movements.  However she was able to squeeze examiner's hand and wiggle toes to command and grimaced and flailed extremities in response to noxious stimulation grossly equally.  Her left pupil was noted to be pinpoint but she was able to track examiner as well as gaze left and right on command.  Due to concern for subclinical status epilepticus she was transferred to [facility] for long-term EEG monitoring  She was last seen by outpatient neurology on 04/26/2020 Mariane Baumgarten of [facility]).   He noted that she had an episode in March 2017 where she awoke confused and having bitten her tongue, and then in May 2017 presented with confusion with MRI demonstrating bilateral increased T2 signal in the hippocampi with CSF demonstrating mild leukocytosis.  She was placed on Depakote and had waxing and waning mental status with auditory and visual hallucinations.  EEG demonstrated diffuse slowing, left temporal slowing and occasional left hemispheric sharps.  She again was readmitted in August 2017 with altered mental status and no clear etiology identified; notably MRI scan had normalized at that time and EEG demonstrated only diffuse slowing.  She was given a diagnosis of Lewy body dementia and antiseizure medication was switched from valproic acid (possibly because this was associated with confusion) to Lamictal (maintained on low doses from 50 mg twice daily to 100 mg twice daily).  She was then lost to follow-up for 2 years and presented twice in 2020 with witnessed seizures again.  Plan was to check her lamotrigine level, which resulted at 5.6 on 05/13/2020, for which her lamotrigine was subsequently increased to 100 mg twice daily.  Per Dr. Doy Mince consult note 03/28/2018, Keppra caused increased agitation and irritability, and Depakote perhaps has been associated with worsening mental status.  There are also some notes of Vimpat having been tried in the past, unclear reason for discontinuation. She has also had extensive work-up for neurodegenerative conditions including 14 3 3  and RT-quic testing, as well as [facility] encephalitis panels per [facility] notes.  Per Dr. Moshe Cipro she is not felt to have a primary autoimmune process at this time.  ROS: Unable to obtain due  to altered mental status.   Past Medical History:  Diagnosis Date   Anxiety    Arthritis    "all over"   Chronic lower back pain    Depression    GERD (gastroesophageal reflux disease)    Headache    "weekly" (07/15/2015)   Hyperlipidemia     Hypertension    Mini stroke ([place])    "several since 05/2014" (07/15/2015)   Seizures ([place]) dx'd 04/2015   Past Surgical History:  Procedure Laterality Date   DILATION AND CURETTAGE OF UTERUS     LUMBAR PUNCTURE  07/14/2015   TUBAL LIGATION     VAGINAL HYSTERECTOMY     Current Outpatient Medications  Medication Instructions   amLODipine (NORVASC) 10 mg, Oral, Daily, For high blood pressure   aspirin EC 81 mg, Oral, BH-each morning   cloNIDine (CATAPRES) 0.1 mg, Oral, 2 times daily   donepezil (ARICEPT) 10 mg, Oral, Daily at bedtime   lamoTRIgine (LAMICTAL) 75 mg, Oral, 2 times daily   nystatin (NYSTATIN) powder Topical, 2 times daily   pantoprazole (PROTONIX) 40 mg, Oral, Daily   propranolol ER (INDERAL LA) 60 mg, Oral, Daily   QUEtiapine (SEROQUEL XR) 50 mg, Oral, Daily at bedtime   rosuvastatin (CRESTOR) 20 mg, Oral, Daily at bedtime, For high cholesterol   sertraline (ZOLOFT) 100 mg, Oral, Daily   vitamin B-12 (CYANOCOBALAMIN) 1,000 mcg, Oral, Daily   Vitamin D3 5,000 Units, Oral, BH-each morning     Family History  Problem Relation Age of Onset   Hyperlipidemia Mother    Breast cancer Mother 10   Hypertension Mother    Heart attack Mother        age 68's   Hyperlipidemia Father    Hypertension Father    Heart attack Father 72   Breast cancer Sister 76    Social History:  reports that she has quit smoking. Her smoking use included cigarettes. She has a 5.00 pack-year smoking history. She has never used smokeless tobacco. She reports current alcohol use. She reports that she does not use drugs.  Though she is persistently positive for cannabinoids    Exam: Current vital signs: BP 132/85   Pulse 67   Temp 98.6 F (37 C)   Resp (!) 25   Ht 5\' 2"  (1.575 m)   LMP  (LMP Unknown)   SpO2 100%   BMI 28.75 kg/m  Vital signs in last 24 hours: Temp:  [98.4 F (36.9 C)-100.2 F (37.9 C)] 98.6 F (37 C) (07/01 0048) Pulse Rate:  [67-111] 67 (07/01 0048) Resp:   [13-25] 25 (07/01 0048) BP: (120-226)/(68-117) 132/85 (07/01 0048) SpO2:  [87 %-100 %] 100 % (07/01 0048) FiO2 (%):  [35 %-50 %] 40 % (07/01 0047) Weight:  [71.3 kg] 71.3 kg (06/30 1256)   Physical Exam  Constitutional: Appears well-developed and well-nourished.  Psych: Affect appropriate to situation, appears slightly anxious when sedation is paused Eyes: No scleral injection HENT: ET tube in place, there does appear to be some bite marks on her tongue MSK: no joint deformities.  Cardiovascular: Perfusing extremities well Respiratory: With sedation paused, coughing and gagging on the ET tube, comfortable with sedation GI: Soft.  No distension. There is no tenderness.  Skin: Warm dry and intact visible skin  Neuro: Mental Status: Patient is awake, alert, follows simple commands including show me 2 fingers, show me your thumb Cranial Nerves: II: Visual Fields are full to blink to threat. Pupils are equal, round, and reactive to  light 1.5 to 1 mm III,IV, VI: EOMI without ptosis or diploplia.  She tracks examiner in all directions, there is significant direction changing nystagmus with her eye movements and they also appear fairly saccadic V: Facial sensation is symmetric to eyelash brush VII: Facial movement is symmetric.  Within limits of assessment from ET tube VIII: hearing is intact to voice X/XI: Cough and gag intact XII: tongue is midline without atrophy or fasciculations (she does protrude this to command).  Sensory/motor: Tone is normal. Bulk is normal.  She does not participate in formal confrontational strength testing, but briskly withdraws all 4 extremities to tickle.  Localizes to the ET tube with bilateral upper extremities Deep Tendon Reflexes: 2+ and symmetric in the biceps and patellae.  Plantars: Toes are upgoing bilaterally.  Cerebellar: Unable to assess secondary to patient's mental status   I have reviewed labs in epic and the results pertinent to this  consultation are: Leukocytosis to 14, hemoglobin 10.3 (baseline 11.3), platelets 303 (baseline 179) CMP notable for mildly increased glucose at 131, mildly increased AST/ALT at 86 and 66 respectively BNP 24.3  Lamotrigine level collected 12:30 PM, result pending UA notable for many bacteria as well as amorphous crystals but no nitrates or leukocytes, small hemoglobin pigment UDS positive for cannabinoids and tricyclics (the latter may be secondary to quetiapine use)  Procalcitonin negative, lactic acid 1.6 (normal)  Influenza/COVID panel negative  I have reviewed the images obtained: Head CT personally reviewed, negative for acute intracranial process Chest x-ray personally reviewed, patchy hyperdensities concerning for edema versus infectious process  Impression: 65 year old woman with complicated neurological history as detailed above, including concern for neurodegenerative disorder as well as partial seizures with secondary generalization, complicated by extreme sensitivity to medications making it difficult to achieve but seizure control without unacceptable side effects.  The most benign explanation is that she had her typical breakthrough seizures complicated by ictal hypertension (or rebound hypertension from missing a clonidine dose) leading to flash pulmonary edema and respiratory decompensation.  Generalized tonic-clonic seizure activity can also be associated with low-grade fever.  However the possibility of an infectious trigger remains and should be thoroughly excluded.  Given her waxing and waning mental status, if she does not rapidly improved to baseline and remained at baseline tomorrow additional work-up such as long-term EEG monitoring and possible LP is indicated -Fever of unknown source, could be secondary to seizure but this is a diagnosis of exclusion  Recommendations: -Low concern for status epilepticus at this time though agree she may have intermittent subclinical  seizures.   -MRI brain with and without contrast to assess for infectious intracranial process -Pending clinical course, consider EEG with LTM tomorrow as well as lumbar puncture if she is not rapidly returning to her baseline -Continue home lamotrigine 100 mg twice daily, follow-up pending level -For now continue additional Keppra 1000 mg twice daily, consider stopping once patient is on LTM given history of psychiatric issues with this medication -We will need to review antiseizure medication history with family when they are available for further discussion, to determine if a second agent may be added for improved long-term seizure control, and what the best agent may be -Appreciate infectious work-up and ventilator management as well as management of comorbidities per CCM team, including treatment of potential aspiration pneumonia/pneumonitis  Lesleigh Noe MD-PhD [facility] [phone] Available 7 PM to 7 AM, outside of these hours please call Neurologist on call as listed on Amion.  CRITICAL CARE Performed by: Dutch Quint  Dimetrius Montfort  Total critical care time: 50 minutes  Critical care time was exclusive of separately billable procedures and treating other patients.  Critical care was necessary to treat or prevent imminent or life-threatening deterioration.  Critical care was time spent personally by me on the following activities: development of treatment plan with patient and/or surrogate as well as nursing, discussions with consultants, evaluation of patient's response to treatment, examination of patient, obtaining history from patient or surrogate, ordering and performing treatments and interventions, ordering and review of laboratory studies, ordering and review of radiographic studies, pulse oximetry and re-evaluation of patient's condition.  Extensive review of outside records was also required as detailed above

## 2020-08-26 NOTE — Progress Notes (Signed)
Subjective: No further seizures overnight.  Patient was extubated this morning.  ROS: Unable to obtain due to poor mental status because of recent extubation  Examination  Vital signs in last 24 hours: Temp:  [98.4 F (36.9 C)-100.2 F (37.9 C)] 98.96 F (37.2 C) (07/01 0500) Pulse Rate:  [57-111] 78 (07/01 0900) Resp:  [13-26] 20 (07/01 1000) BP: (112-226)/(46-124) 116/46 (07/01 1000) SpO2:  [87 %-100 %] 100 % (07/01 1000) FiO2 (%):  [35 %-50 %] 40 % (07/01 0755) Weight:  [71.3 kg] 71.3 kg (06/30 1256)  General: lying in bed, not in apparent distress CVS: pulse-normal rate and rhythm RS: breathing comfortably, coarse breath sounds bilaterally Extremities: normal, warm  Neuro: MS: Alert, oriented to place and person, was able to tell me the year is 2022 after giving three options, follows simple one-step commands CN: pupils equal and reactive,  EOMI, face symmetric, tongue midline, normal sensation over face, Motor: 3/5 strength in all 4 extremities  Basic Metabolic Panel: Recent Labs  Lab 08/25/20 1230 08/26/20 0252 08/26/20 0315  NA 138 138 139  K 4.0 3.8 4.0  CL 105 103  --   CO2 24 24  --   GLUCOSE 131* 114*  --   BUN 13 14  --   CREATININE 0.95 1.02*  --   CALCIUM 9.2 9.1  --   MG  --  2.3  --   PHOS  --  4.7*  --     CBC: Recent Labs  Lab 08/25/20 1230 08/26/20 0252 08/26/20 0315  WBC 14.0* 14.4*  --   NEUTROABS 12.5*  --   --   HGB 10.3* 9.5* 10.5*  HCT 34.0* 32.7* 31.0*  MCV 72.3* 73.2*  --   PLT 303 273  --      Coagulation Studies: No results for input(s): LABPROT, INR in the last 72 hours.  Imaging MRI brain with and without contrast 08/26/2020: No acute abnormality.   ASSESSMENT AND PLAN: 65 year old female with history of epilepsy who presented with breakthrough seizures.   Epilepsy with breakthrough seizure Acute encephalopathy, postictal Leukocytosis Microcytic anemia Transaminitis Cannabis use disorder AKI -Etiology for  breakthrough seizures: Chest x-ray showed pulmonary edema versus infiltrate likely due to aspiration and not infectious, lamotrigine level pending. -Leukocytosis possibly due to seizure   Recommendations -Per review of chart, lamotrigine level on 05/13/2020 was 5.6.  Patient was taking lamotrigine 75 mg twice daily at that time which was then increased to 100 mg twice daily.  Current lamotrigine level pending.  However, due to another breakthrough seizure without any clear provoking factors, I would recommend increasing lamotrigine to 125 mg nightly, continue lamotrigine 100 mg every morning.  -Patient was started on Keppra during this hospitalization.  However, I would prefer to maximize lamotrigine dose before adding another AED.  Additionally, Keppra can worsen psychiatric disorders and patient has had hallucinations in the past along with a history of schizoaffective disorder.  This was discussed with patient and son and he is in agreement -Continue seizure precautions -As needed IV Ativan 2 mg for clinical seizure-like activity -Follow-up with neurology Dr.Radtke at [facility] in 6 to 8 weeks -Management of rest of comorbidities per primary team  I have spent a total of 35  minutes with the patient reviewing hospital notes,  test results, labs and examining the patient as well as establishing an assessment and plan that was discussed personally with the patient's son and Dr Tacy Learn.  > 50% of time was spent in direct  patient care.     Zeb Comfort Epilepsy [facility] For questions after 5pm please refer to AMION to reach the Neurologist on call

## 2020-08-27 ENCOUNTER — Inpatient Hospital Stay (HOSPITAL_COMMUNITY): Payer: Medicare HMO

## 2020-08-27 LAB — COMPREHENSIVE METABOLIC PANEL
ALT: 54 U/L — ABNORMAL HIGH (ref 0–44)
AST: 51 U/L — ABNORMAL HIGH (ref 15–41)
Albumin: 3.4 g/dL — ABNORMAL LOW (ref 3.5–5.0)
Alkaline Phosphatase: 85 U/L (ref 38–126)
Anion gap: 12 (ref 5–15)
BUN: 10 mg/dL (ref 8–23)
CO2: 25 mmol/L (ref 22–32)
Calcium: 9 mg/dL (ref 8.9–10.3)
Chloride: 102 mmol/L (ref 98–111)
Creatinine, Ser: 0.8 mg/dL (ref 0.44–1.00)
GFR, Estimated: >60 mL/min (ref >60)
Glucose, Bld: 122 mg/dL — ABNORMAL HIGH (ref 70–99)
Potassium: 4 mmol/L (ref 3.5–5.1)
Sodium: 139 mmol/L (ref 135–145)
Total Bilirubin: 0.7 mg/dL (ref 0.3–1.2)
Total Protein: 7.1 g/dL (ref 6.5–8.1)

## 2020-08-27 LAB — AMMONIA: Ammonia: <9 umol/L — ABNORMAL LOW (ref 9–35)

## 2020-08-27 LAB — URINE CULTURE
Culture: NO GROWTH
Culture: NO GROWTH

## 2020-08-27 LAB — VITAMIN B12: Vitamin B-12: 1398 pg/mL — ABNORMAL HIGH (ref 180–914)

## 2020-08-27 LAB — PROCALCITONIN: Procalcitonin: <0.1 ng/mL

## 2020-08-27 LAB — T4, FREE: Free T4: 1.13 ng/dL — ABNORMAL HIGH (ref 0.61–1.12)

## 2020-08-27 LAB — TROPONIN I (HIGH SENSITIVITY): Troponin I (High Sensitivity): 10 ng/L (ref <18)

## 2020-08-27 LAB — TSH: TSH: 0.934 u[IU]/mL (ref 0.350–4.500)

## 2020-08-27 MED ORDER — FUROSEMIDE 10 MG/ML IJ SOLN
20.0000 mg | Freq: Two times a day (BID) | INTRAMUSCULAR | Status: DC
  Administered 2020-08-27 – 2020-08-28 (×2): 20 mg via INTRAVENOUS
  Filled 2020-08-27 (×2): qty 2

## 2020-08-27 MED ORDER — IPRATROPIUM-ALBUTEROL 0.5-2.5 (3) MG/3ML IN SOLN
3.0000 mL | RESPIRATORY_TRACT | Status: DC
  Administered 2020-08-27 – 2020-08-28 (×7): 3 mL via RESPIRATORY_TRACT
  Filled 2020-08-27 (×5): qty 3

## 2020-08-27 MED ORDER — HYDRALAZINE HCL 20 MG/ML IJ SOLN: 10.0000 mg | INTRAMUSCULAR | Status: DC | PRN

## 2020-08-27 MED ORDER — RACEPINEPHRINE HCL 2.25 % IN NEBU
0.5000 mL | INHALATION_SOLUTION | Freq: Once | RESPIRATORY_TRACT | Status: AC
  Administered 2020-08-27: 0.5 mL via RESPIRATORY_TRACT
  Filled 2020-08-27: qty 0.5

## 2020-08-27 MED ORDER — AMLODIPINE BESYLATE 10 MG PO TABS
10.0000 mg | ORAL_TABLET | Freq: Every day | ORAL | Status: DC
  Administered 2020-08-27 – 2020-08-31 (×5): 10 mg via ORAL
  Filled 2020-08-27 (×5): qty 1

## 2020-08-27 MED ORDER — IPRATROPIUM-ALBUTEROL 0.5-2.5 (3) MG/3ML IN SOLN
3.0000 mL | RESPIRATORY_TRACT | Status: DC | PRN
  Administered 2020-08-27 (×2): 3 mL via RESPIRATORY_TRACT
  Filled 2020-08-27 (×2): qty 3

## 2020-08-27 MED ORDER — PANTOPRAZOLE SODIUM 40 MG PO TBEC
40.0000 mg | DELAYED_RELEASE_TABLET | Freq: Every day | ORAL | Status: DC
  Administered 2020-08-27 – 2020-08-31 (×5): 40 mg via ORAL
  Filled 2020-08-27 (×5): qty 1

## 2020-08-27 MED ORDER — LORAZEPAM 2 MG/ML IJ SOLN
0.5000 mg | INTRAMUSCULAR | Status: DC | PRN
  Administered 2020-08-27 (×2): 0.5 mg via INTRAVENOUS
  Filled 2020-08-27 (×3): qty 1

## 2020-08-27 MED ORDER — HALOPERIDOL LACTATE 5 MG/ML IJ SOLN
2.0000 mg | Freq: Four times a day (QID) | INTRAMUSCULAR | Status: DC | PRN
  Administered 2020-08-29: 2 mg via INTRAVENOUS
  Filled 2020-08-27: qty 1

## 2020-08-27 MED ORDER — BENZONATATE 100 MG PO CAPS
100.0000 mg | ORAL_CAPSULE | Freq: Three times a day (TID) | ORAL | Status: DC
  Administered 2020-08-27 – 2020-08-31 (×12): 100 mg via ORAL
  Filled 2020-08-27 (×13): qty 1

## 2020-08-27 MED ORDER — QUETIAPINE FUMARATE 25 MG PO TABS
12.5000 mg | ORAL_TABLET | Freq: Two times a day (BID) | ORAL | Status: DC
  Administered 2020-08-27 – 2020-08-29 (×6): 12.5 mg via ORAL
  Filled 2020-08-27 (×6): qty 1

## 2020-08-27 MED ORDER — HALOPERIDOL LACTATE 5 MG/ML IJ SOLN
5.0000 mg | Freq: Once | INTRAMUSCULAR | Status: DC
  Filled 2020-08-27: qty 1

## 2020-08-27 MED ORDER — CLONIDINE HCL 0.1 MG PO TABS
0.1000 mg | ORAL_TABLET | Freq: Two times a day (BID) | ORAL | Status: DC
  Administered 2020-08-27 – 2020-08-31 (×9): 0.1 mg via ORAL
  Filled 2020-08-27 (×9): qty 1

## 2020-08-27 MED ORDER — PROPRANOLOL HCL ER 60 MG PO CP24: 60.0000 mg | ORAL_CAPSULE | Freq: Every day | ORAL | Status: DC

## 2020-08-27 MED ORDER — HALOPERIDOL LACTATE 5 MG/ML IJ SOLN
5.0000 mg | Freq: Once | INTRAMUSCULAR | Status: AC
  Administered 2020-08-27: 5 mg via INTRAVENOUS

## 2020-08-27 MED ORDER — LABETALOL HCL 5 MG/ML IV SOLN
10.0000 mg | INTRAVENOUS | Status: DC | PRN
  Administered 2020-08-28 – 2020-08-31 (×4): 10 mg via INTRAVENOUS
  Filled 2020-08-27 (×5): qty 4

## 2020-08-27 MED ORDER — DONEPEZIL HCL 10 MG PO TABS
10.0000 mg | ORAL_TABLET | Freq: Every day | ORAL | Status: DC
  Administered 2020-08-27 – 2020-08-30 (×4): 10 mg via ORAL
  Filled 2020-08-27 (×4): qty 1

## 2020-08-27 MED ORDER — METHYLPREDNISOLONE SODIUM SUCC 40 MG IJ SOLR
40.0000 mg | Freq: Every day | INTRAMUSCULAR | Status: DC
  Administered 2020-08-27 – 2020-08-29 (×3): 40 mg via INTRAVENOUS
  Filled 2020-08-27 (×5): qty 1

## 2020-08-27 MED ORDER — QUETIAPINE FUMARATE ER 50 MG PO TB24
50.0000 mg | ORAL_TABLET | Freq: Every day | ORAL | Status: DC
  Filled 2020-08-27: qty 1

## 2020-08-27 MED ORDER — FUROSEMIDE 10 MG/ML IJ SOLN
20.0000 mg | Freq: Once | INTRAMUSCULAR | Status: AC
  Administered 2020-08-27: 20 mg via INTRAVENOUS
  Filled 2020-08-27: qty 2

## 2020-08-27 MED ORDER — DM-GUAIFENESIN ER 30-600 MG PO TB12
1.0000 | ORAL_TABLET | Freq: Two times a day (BID) | ORAL | Status: DC
  Administered 2020-08-27 – 2020-08-31 (×9): 1 via ORAL
  Filled 2020-08-27 (×9): qty 1

## 2020-08-27 NOTE — Progress Notes (Signed)
[facility] Progress Note Patient Name: Sandra Charles DOB: April 24, 1955 MRN: [id number]   Date of Service  08/27/2020  HPI/Events of Note  Pateint c/o SOB and some chest tightness. Nursing request for Duonebs,  eICU Interventions  Plan: Duoneb 3 mL via nebulizer now and Q 4 hours PRN SOB or wheezing.  12 Lead EKG STAT.     Intervention Category Major Interventions: Other:  Lysle Dingwall 08/27/2020, 4:37 AM

## 2020-08-27 NOTE — Progress Notes (Signed)
[facility] Progress Note  Patient: Sandra Charles    XBD:[id number]  DOA: 08/26/2020     Date of Service: the patient was seen and examined on 08/27/2020  Brief hospital course: Past medical history of HTN, HLD, TIA, depression, anxiety, seizure disorder with admissions for breakthrough seizures, obesity. Presents to [facility] on After having 2 seizures at home. On arrival to the ER patient became progressively confused, not able to protect her airway requiring intubation. Neurology consulted.  Loaded with IV Keppra.  Transferred to [facility] for continuous EEG monitoring. 6/30 was admitted to the ICU here Extubated 7/1 Transferred to [facility] on 7/2  Currently plan is continue respiratory support, continue progressive level of care, may require BiPAP, monitor for recurrent seizure activities, speech therapy consult.  Assessment and Plan: 1.  Recurrent breakthrough seizure Acute metabolic encephalopathy History of seizure disorder Neurology consulted. Currently continuous EEG monitoring some ongoing seizures but Loaded with IV Keppra load currently discontinued. Home Vimpat dose increased 100 in the morning 125. Neurology continues to follow the patient. Unit as needed Ativan. Follow-up with neurologist outpatient in 6 to 8 weeks. With ongoing agitation currently will be on IV Haldol and twice a day Seroquel.  2.  Acute hypoxic and hypercarbic respiratory failure, present on admission Aspiration pneumonia Suspected OSA 87% on room air.  Requiring 2 LPM oxygen.  Later on required intubation. Currently extubated Still in respiratory distress right now. Chest x-ray shows vascular congestion. Will treat with IV Lasix. Do not think the patient is currently experiencing stridor but will try racemic epi x1. Continue DuoNebs. Started on IV steroids.  Monitor. Already on IV antibiotics for aspiration. Monitor on progressive care unit. With patient's obesity patient remains at risk  for OSA.  ABG after extubation actually shows morning hypercarbia.  Daughter also reports that the patient is a heavy sleeper.  Recommend outpatient work-up  3.  HTN Blood pressure uncontrolled. Resuming home regimen other than propranolol.  4.  HLD, TIA. Continue aspirin.  Continue Crestor.  5.  Anxiety, depression, dementia Resuming some of the home medication.  6.  Obesity Placing the patient at high risk of poor outcome. Body mass index is 35.44 kg/m.   7.  Chest pain. Troponins are negative.  EKG negative for ischemia.  Monitor.  Diet: Dysphagia 3 diet, speech therapy consulted. DVT Prophylaxis:   heparin injection 5,000 Units Start: 08/26/20 1400 SCDs Start: 08/26/20 0052    Advance goals of care discussion: Full code  Family Communication: family was present at bedside, at the time of interview.  The pt provided permission to discuss medical plan with the family. Opportunity was given to ask question and all questions were answered satisfactorily.   Disposition:  Status is: Inpatient  Remains inpatient appropriate because:IV treatments appropriate due to intensity of illness or inability to take PO and Inpatient level of care appropriate due to severity of illness  Dispo: The patient is from: Home              Anticipated d/c is to: Home              Patient currently is not medically stable to d/c.   Difficult to place patient No        Subjective: No nausea no vomiting Reports shortness of breath.  Very agitated.  No chest pain or chest tightness right now but overnight had some chest pain.  Physical Exam:  General: Appear in mild distress, no Rash; Oral Mucosa Clear, moist.  no Abnormal Neck Mass Or lumps, Conjunctiva normal  Cardiovascular: S1 and S2 Present, no Murmur, Respiratory: increased respiratory effort, Bilateral Air entry present and bilateral  Crackles, no wheezes Abdomen: Bowel Sound present, Soft and no tenderness Extremities: Bilateral  pedal edema Neurology: alert and oriented to  place, and person affect anxious and agitated. no new focal deficit Gait not checked due to patient safety concerns    Vitals:   08/27/20 0800 08/27/20 0900 08/27/20 1003 08/27/20 1100  BP: (!) 197/79 140/72    Pulse: 100 96    Resp: (!) 26 (!) 26    Temp: 97.9 F (36.6 C)   98.2 F (36.8 C)  TempSrc: Axillary   Oral  SpO2: (!) 89% 100% 100%   Weight:      Height:       Filed Weights   08/27/20 0400  Weight: 87.9 kg    Data Reviewed: I have personally reviewed and interpreted daily labs, tele strips, imaging. I reviewed all nursing notes, pharmacy notes, vitals, pertinent old records I have discussed plan of care as described above with RN and patient/family.  CBC: Recent Labs  Lab 08/25/20 1230 08/26/20 0252 08/26/20 0315  WBC 14.0* 14.4*  --   NEUTROABS 12.5*  --   --   HGB 10.3* 9.5* 10.5*  HCT 34.0* 32.7* 31.0*  MCV 72.3* 73.2*  --   PLT 303 273  --    Basic Metabolic Panel: Recent Labs  Lab 08/25/20 1230 08/26/20 0252 08/26/20 0315 08/27/20 0841  NA 138 138 139 139  K 4.0 3.8 4.0 4.0  CL 105 103  --  102  CO2 24 24  --  25  GLUCOSE 131* 114*  --  122*  BUN 13 14  --  10  CREATININE 0.95 1.02*  --  0.80  CALCIUM 9.2 9.1  --  9.0  MG  --  2.3  --   --   PHOS  --  4.7*  --   --     Studies: DG CHEST PORT 1 VIEW  Result Date: 08/27/2020 CLINICAL DATA:  Cough and shortness of breath. EXAM: PORTABLE CHEST 1 VIEW COMPARISON:  08/26/2020 FINDINGS: Stable cardiomediastinal contours. Interval removal of the ET tube and enteric tube. Decreased lung volumes. No pleural effusion or edema. Continued interval improvement in subtle airspace opacities within the left upper lobe. No new findings. IMPRESSION: Continued interval improvement in subtle airspace opacities within the left upper lobe. Electronically Signed   By: Kerby Moors M.D.   On: 08/27/2020 10:40   ECHOCARDIOGRAM COMPLETE  Result Date: 08/26/2020     ECHOCARDIOGRAM REPORT   Patient Name:   Sandra Charles Date of Exam: 08/26/2020 Medical Rec #:  [id number]    Height:       62.0 in Accession #:    [id number]   Weight:       157.2 lb Date of Birth:  [birth year]    BSA:          1.726 m Patient Age:    19 years     BP:           132/49 mmHg Patient Gender: F            HR:           78 bpm. Exam Location:  Inpatient Procedure: 2D Echo Indications:    acute diastolic chf  History:        Patient has no prior history of Echocardiogram examinations.  Seizures; Risk Factors:Hypertension and Dyslipidemia.  Sonographer:    Johny Chess Referring Phys: [id number] [place]  1. Left ventricular ejection fraction, by estimation, is 60 to 65%. The left ventricle has normal function. The left ventricle has no regional wall motion abnormalities. Left ventricular diastolic parameters were normal.  2. Right ventricular systolic function is normal. The right ventricular size is normal. There is mildly elevated pulmonary artery systolic pressure.  3. The mitral valve is normal in structure. Trivial mitral valve regurgitation.  4. The aortic valve is grossly normal. Aortic valve regurgitation is not visualized. FINDINGS  Left Ventricle: Left ventricular ejection fraction, by estimation, is 60 to 65%. The left ventricle has normal function. The left ventricle has no regional wall motion abnormalities. The left ventricular internal cavity size was normal in size. There is  no left ventricular hypertrophy. Left ventricular diastolic parameters were normal. Right Ventricle: The right ventricular size is normal. Right vetricular wall thickness was not well visualized. Right ventricular systolic function is normal. There is mildly elevated pulmonary artery systolic pressure. The tricuspid regurgitant velocity  is 2.96 m/s, and with an assumed right atrial pressure of 3 mmHg, the estimated right ventricular systolic pressure is 73.7 mmHg. Left Atrium: Left  atrial size was normal in size. Right Atrium: Right atrial size was normal in size. Pericardium: There is no evidence of pericardial effusion. Mitral Valve: The mitral valve is normal in structure. Trivial mitral valve regurgitation. Tricuspid Valve: The tricuspid valve is grossly normal. Tricuspid valve regurgitation is trivial. Aortic Valve: The aortic valve is grossly normal. Aortic valve regurgitation is not visualized. Aortic valve mean gradient measures 6.0 mmHg. Aortic valve peak gradient measures 11.3 mmHg. Aortic valve area, by VTI measures 1.46 cm. Pulmonic Valve: The pulmonic valve was grossly normal. Pulmonic valve regurgitation is trivial. Aorta: The aortic root and ascending aorta are structurally normal, with no evidence of dilitation. IAS/Shunts: The atrial septum is grossly normal.  LEFT VENTRICLE PLAX 2D LVIDd:         4.50 cm  Diastology LVIDs:         3.00 cm  LV e' medial:    11.00 cm/s LV PW:         1.10 cm  LV E/e' medial:  10.4 LV IVS:        1.20 cm  LV e' lateral:   14.40 cm/s LVOT diam:     1.60 cm  LV E/e' lateral: 7.9 LV SV:         48 LV SV Index:   28 LVOT Area:     2.01 cm  RIGHT VENTRICLE             IVC RV S prime:     16.30 cm/s  IVC diam: 1.50 cm TAPSE (M-mode): 2.3 cm LEFT ATRIUM             Index       RIGHT ATRIUM           Index LA diam:        3.90 cm 2.26 cm/m  RA Area:     12.10 cm LA Vol (A2C):   49.5 ml 28.68 ml/m RA Volume:   27.20 ml  15.76 ml/m LA Vol (A4C):   43.8 ml 25.38 ml/m LA Biplane Vol: 47.9 ml 27.76 ml/m  AORTIC VALVE AV Area (Vmax):    1.41 cm AV Area (Vmean):   1.43 cm AV Area (VTI):     1.46 cm AV Vmax:  168.00 cm/s AV Vmean:          115.000 cm/s AV VTI:            0.331 m AV Peak Grad:      11.3 mmHg AV Mean Grad:      6.0 mmHg LVOT Vmax:         118.00 cm/s LVOT Vmean:        81.900 cm/s LVOT VTI:          0.241 m LVOT/AV VTI ratio: 0.73  AORTA Ao Root diam: 2.60 cm Ao Asc diam:  2.70 cm MITRAL VALVE                TRICUSPID VALVE MV  Area (PHT): 3.53 cm     TR Peak grad:   35.0 mmHg MV Decel Time: 215 msec     TR Vmax:        296.00 cm/s MV E velocity: 114.00 cm/s MV A velocity: 148.00 cm/s  SHUNTS MV E/A ratio:  0.77         Systemic VTI:  0.24 m                             Systemic Diam: 1.60 cm Mertie Moores MD Electronically signed by Mertie Moores MD Signature Date/Time: 08/26/2020/4:54:42 PM    Final     Scheduled Meds:  aspirin  81 mg Oral Daily   benzonatate  100 mg Oral TID   Chlorhexidine Gluconate Cloth  6 each Topical Daily   dextromethorphan-guaiFENesin  1 tablet Oral BID   docusate  100 mg Oral BID   donepezil  10 mg Oral QHS   heparin  5,000 Units Subcutaneous Q8H   ipratropium-albuterol  3 mL Nebulization Q4H   lamoTRIgine  100 mg Oral q morning   lamoTRIgine  125 mg Oral QHS   methylPREDNISolone (SOLU-MEDROL) injection  40 mg Intravenous Daily   pantoprazole  40 mg Oral Daily   polyethylene glycol  17 g Oral Daily   QUEtiapine  50 mg Oral QHS   Continuous Infusions:  sodium chloride 10 mL/hr at 08/27/20 0700   ampicillin-sulbactam (UNASYN) IV 3 g (08/27/20 0823)   PRN Meds: hydrALAZINE, LORazepam  Time spent: 35 minutes  Author: Berle Mull, MD [facility] 08/27/2020 11:45 AM  To reach On-call, see care teams to locate the attending and reach out via [email or web address]. Between 7PM-7AM, please contact night-coverage If you still have difficulty reaching the attending provider, please page the [facility] (Director on Call) for [facility] on amion for assistance.

## 2020-08-28 LAB — COMPREHENSIVE METABOLIC PANEL
ALT: 51 U/L — ABNORMAL HIGH (ref 0–44)
AST: 43 U/L — ABNORMAL HIGH (ref 15–41)
Albumin: 3.3 g/dL — ABNORMAL LOW (ref 3.5–5.0)
Alkaline Phosphatase: 77 U/L (ref 38–126)
Anion gap: 14 (ref 5–15)
BUN: 19 mg/dL (ref 8–23)
CO2: 27 mmol/L (ref 22–32)
Calcium: 9.5 mg/dL (ref 8.9–10.3)
Chloride: 98 mmol/L (ref 98–111)
Creatinine, Ser: 1.06 mg/dL — ABNORMAL HIGH (ref 0.44–1.00)
GFR, Estimated: 59 mL/min — ABNORMAL LOW (ref >60)
Glucose, Bld: 99 mg/dL (ref 70–99)
Potassium: 3.4 mmol/L — ABNORMAL LOW (ref 3.5–5.1)
Sodium: 139 mmol/L (ref 135–145)
Total Bilirubin: 0.9 mg/dL (ref 0.3–1.2)
Total Protein: 7.5 g/dL (ref 6.5–8.1)

## 2020-08-28 LAB — CBC WITH DIFFERENTIAL/PLATELET
Abs Immature Granulocytes: 0.05 10*3/uL (ref 0.00–0.07)
Basophils Absolute: 0 10*3/uL (ref 0.0–0.1)
Basophils Relative: 0 %
Eosinophils Absolute: 0 10*3/uL (ref 0.0–0.5)
Eosinophils Relative: 0 %
HCT: 30.7 % — ABNORMAL LOW (ref 36.0–46.0)
Hemoglobin: 9.2 g/dL — ABNORMAL LOW (ref 12.0–15.0)
Immature Granulocytes: 0 %
Lymphocytes Relative: 18 %
Lymphs Abs: 2.1 10*3/uL (ref 0.7–4.0)
MCH: 21.4 pg — ABNORMAL LOW (ref 26.0–34.0)
MCHC: 30 g/dL (ref 30.0–36.0)
MCV: 71.6 fL — ABNORMAL LOW (ref 80.0–100.0)
Monocytes Absolute: 0.9 10*3/uL (ref 0.1–1.0)
Monocytes Relative: 8 %
Neutro Abs: 8.7 10*3/uL — ABNORMAL HIGH (ref 1.7–7.7)
Neutrophils Relative %: 74 %
Platelets: 264 10*3/uL (ref 150–400)
RBC: 4.29 MIL/uL (ref 3.87–5.11)
RDW: 18.7 % — ABNORMAL HIGH (ref 11.5–15.5)
WBC: 11.9 10*3/uL — ABNORMAL HIGH (ref 4.0–10.5)
nRBC: 0 % (ref 0.0–0.2)

## 2020-08-28 LAB — CULTURE, RESPIRATORY W GRAM STAIN: Culture: NORMAL

## 2020-08-28 LAB — PROCALCITONIN: Procalcitonin: <0.1 ng/mL

## 2020-08-28 LAB — MAGNESIUM: Magnesium: 2.1 mg/dL (ref 1.7–2.4)

## 2020-08-28 MED ORDER — POTASSIUM CHLORIDE CRYS ER 20 MEQ PO TBCR: 40.0000 meq | EXTENDED_RELEASE_TABLET | ORAL | Status: DC

## 2020-08-28 MED ORDER — POTASSIUM CHLORIDE 20 MEQ PO PACK
40.0000 meq | PACK | ORAL | Status: AC
  Administered 2020-08-28 (×2): 40 meq via ORAL
  Filled 2020-08-28 (×2): qty 2

## 2020-08-28 MED ORDER — IPRATROPIUM-ALBUTEROL 0.5-2.5 (3) MG/3ML IN SOLN
3.0000 mL | Freq: Two times a day (BID) | RESPIRATORY_TRACT | Status: DC
  Administered 2020-08-28 – 2020-08-29 (×3): 3 mL via RESPIRATORY_TRACT
  Filled 2020-08-28 (×4): qty 3

## 2020-08-28 NOTE — Evaluation (Signed)
Clinical/Bedside Swallow Evaluation Patient Details  Name: DESA RECH MRN: [id number] Date of Birth: 1955/07/13  Today's Date: 08/28/2020 Time: SLP Start Time (ACUTE ONLY): 0930 SLP Stop Time (ACUTE ONLY): 0945 SLP Time Calculation (min) (ACUTE ONLY): 15 min  Past Medical History:  Past Medical History:  Diagnosis Date   Anxiety    Arthritis    "all over"   Chronic lower back pain    Depression    GERD (gastroesophageal reflux disease)    Headache    "weekly" (07/15/2015)   Hyperlipidemia    Hypertension    Mini stroke ([place])    "several since 05/2014" (07/15/2015)   Seizures ([place]) dx'd 04/2015   Past Surgical History:  Past Surgical History:  Procedure Laterality Date   DILATION AND CURETTAGE OF UTERUS     LUMBAR PUNCTURE  07/14/2015   TUBAL LIGATION     VAGINAL HYSTERECTOMY     HPI:  Patient is a 65 y.o. female with PMH: HTN, HLD, TIA, obesity, depression, anxiety, seizure disorder with admissions for breakthrough seizures. She presented to [facility] on 6/30 with seizure, waxing and waning mental status and acute hypoxemia. In ER, she became progressively confused, not able to protect her airway and required emergent intubation. She transfered to [facility] on 7/1 for continuous EEG monitoroing. She extubated on 7/1. CXR on 6/30 revealed low lung volumes, Diffuse bilateral pulmonary infiltrates/edema however most recent CXR on 7/2 showed continued interval improvement in subtle airspace opacities within the left upper lobe.   Assessment / Plan / Recommendation Clinical Impression  Patient presents with a mild oropharyngeal dysphagia with impact from current state of drowsiness/fatigue. When SLP arrived in room, patient was asleep with breakfast meal tray on table in front of her. SLP approximated amount of food she had eaten to be 15-20% and RN reported she has not been eating much. Patient asleep but awoke without difficulty and agreeable to PO's of liquid sips and puree bites (applesauce).  She exhibited a mild delay in oral transit of puree solids, and one instance of dry sounding cough towards end of liquid and solid PO intake. Voice remained clear throughout assessment. SLP is recommending for patient to continue with Dys 3 solids, thin liquids with plan to determine ongoing toleration and ability to advance solids textures. SLP Visit Diagnosis: Dysphagia, unspecified (R13.10)    Aspiration Risk  No limitations;Mild aspiration risk    Diet Recommendation Dysphagia 3 (Mech soft);Thin liquid   Liquid Administration via: Cup;Straw Medication Administration: Whole meds with liquid Supervision: Patient able to self feed;Intermittent supervision to cue for compensatory strategies Compensations: Minimize environmental distractions;Slow rate;Small sips/bites Postural Changes: Seated upright at 90 degrees    Other  Recommendations Oral Care Recommendations: Oral care BID;Staff/trained caregiver to provide oral care   Follow up Recommendations None      Frequency and Duration min 1 x/week  1 week       Prognosis Prognosis for Safe Diet Advancement: Good      Swallow Study   General Date of Onset: 08/26/20 HPI: Patient is a 65 y.o. female with PMH: HTN, HLD, TIA, obesity, depression, anxiety, seizure disorder with admissions for breakthrough seizures. She presented to [facility] on 6/30 with seizure, waxing and waning mental status and acute hypoxemia. In ER, she became progressively confused, not able to protect her airway and required emergent intubation. She transfered to [facility] on 7/1 for continuous EEG monitoroing. She extubated on 7/1. CXR on 6/30 revealed low lung volumes, Diffuse bilateral pulmonary infiltrates/edema however  most recent CXR on 7/2 showed continued interval improvement in subtle airspace opacities within the left upper lobe. Type of Study: Bedside Swallow Evaluation Previous Swallow Assessment: during previous admission in 2020 Diet Prior to this Study: Dysphagia  3 (soft);Thin liquids Temperature Spikes Noted: No Respiratory Status: Nasal cannula History of Recent Intubation: Yes Length of Intubations (days): 2 days Date extubated: 08/26/20 Behavior/Cognition: Alert;Cooperative;Lethargic/Drowsy Oral Cavity Assessment: Within Functional Limits Oral Care Completed by SLP: Yes Oral Cavity - Dentition: Adequate natural dentition Vision: Functional for self-feeding Self-Feeding Abilities: Able to feed self;Needs set up Patient Positioning: Upright in bed Baseline Vocal Quality: Low vocal intensity Volitional Cough: Weak Volitional Swallow: Able to elicit    Oral/Motor/Sensory Function Overall Oral Motor/Sensory Function: Within functional limits   Ice Chips     Thin Liquid Thin Liquid: Impaired Presentation: Straw;Self Fed Pharyngeal  Phase Impairments: Cough - Delayed    Nectar Thick     Honey Thick     Puree Puree: Impaired Oral Phase Functional Implications: Prolonged oral transit Other Comments: mild delay in oral transit of bolus   Solid     Solid: Not tested     Sonia Baller, MA, CCC-SLP Speech Therapy [facility]

## 2020-08-28 NOTE — Progress Notes (Signed)
[facility] Progress Note  Patient: Sandra Charles    JSH:[id number]  DOA: 08/26/2020     Date of Service: the patient was seen and examined on 08/28/2020  Brief hospital course: Past medical history of HTN, HLD, TIA, depression, anxiety, seizure disorder with admissions for breakthrough seizures, obesity. Presents to [facility] on 6/30 after having 2 seizures at home. On arrival to the ER patient became progressively confused, not able to protect her airway requiring intubation. Neurology consulted.  Loaded with IV Keppra.  Transferred to [facility] for continuous EEG monitoring. 7/1 midnight admitted to the ICU at [facility] 7/1 extubated during the day 7/2 transferred to [facility]   Currently plan is continue to treat acute encephalopathy.  May require BiPAP for respiratory support  Assessment and Plan: 1.  Recurrent breakthrough seizure Acute metabolic encephalopathy History of seizure disorder Neurology consulted. Etiology of breakthrough seizure is not clear. Neurology consulted. Lamotrigine level ordered but currently pending. EEG negative for any acute seizure activity and shows encephalopathy. Loaded with IV Keppra load currently discontinued. Home Vimpat dose increased 100 in the morning 125. Follow-up with [facility] neurologist outpatient in 6 to 8 weeks. With ongoing agitation currently will be on IV Haldol and twice a day Seroquel.  2.  Acute hypoxic and hypercarbic respiratory failure, present on admission Aspiration pneumonia Suspected OSA On admission 87% on room air.  Requiring 2 LPM oxygen.  Later on required intubation. Currently extubated.  Respiratory distress improving. Chest x-ray shows vascular congestion. Was given IV Lasix.  Transition to p.o. Lasix. Continue DuoNebs. Started on IV steroids.  Will taper tomorrow. Already on IV antibiotics for aspiration. Monitor on progressive care unit. With patient's obesity patient remains at risk for OSA.  ABG after  extubation actually shows morning hypercarbia.  Daughter also reports that the patient is a heavy sleeper.  Recommend outpatient work-up  3.  Accelerated hypertension Blood pressure uncontrolled. Resuming home regimen other than propranolol.  4.  HLD, TIA. Continue aspirin.  Continue Crestor.  5.  Anxiety, depression, dementia Resuming some of the home medication.  6.  Obesity Placing the patient at high risk of poor outcome. Body mass index is 35.44 kg/m.   7.  Chest pain. Troponins are negative.  EKG negative for ischemia.  Monitor.  Diet: Dysphagia 3 diet, speech therapy consulted. DVT Prophylaxis:   heparin injection 5,000 Units Start: 08/26/20 1400 SCDs Start: 08/26/20 0052    Advance goals of care discussion: Full code  Family Communication: no family was present at bedside, at the time of interview.   Disposition:  Status is: Inpatient  Remains inpatient appropriate because:IV treatments appropriate due to intensity of illness or inability to take PO and Inpatient level of care appropriate due to severity of illness  Dispo: The patient is from: Home              Anticipated d/c is to: Home              Patient currently is not medically stable to d/c.   Difficult to place patient No  Subjective: More calm.  No nausea no vomiting.  No fever no chills.  Eating her diet.  No chest pain.  No acute event  Physical Exam:  General: Appear in mild distress, no Rash; Oral Mucosa Clear, moist. no Abnormal Neck Mass Or lumps, Conjunctiva normal  Cardiovascular: S1 and S2 Present, no Murmur, Respiratory: good respiratory effort, Bilateral Air entry present and CTA, no Crackles, no wheezes Abdomen: Bowel  Sound present, Soft and no tenderness Extremities: no Pedal edema Neurology: alert and not oriented to time, place, and person affect appropriate. no new focal deficit Gait not checked due to patient safety concerns   Vitals:   08/28/20 1500 08/28/20 1600 08/28/20 1700  08/28/20 1800  BP: (!) 147/63 (!) 146/61 (!) 142/67 (!) 165/73  Pulse: 74 79 69 78  Resp: (!) 22 18 16 20   Temp:  98.8 F (37.1 C)    TempSrc:  Axillary    SpO2: 96% 93% 98% 98%  Weight:      Height:       Filed Weights   08/27/20 0400  Weight: 87.9 kg    Data Reviewed: I have personally reviewed and interpreted daily labs, tele strips, imaging. I reviewed all nursing notes, pharmacy notes, vitals, pertinent old records I have discussed plan of care as described above with RN and patient/family.  CBC: Recent Labs  Lab 08/25/20 1230 08/26/20 0252 08/26/20 0315 08/28/20 0500  WBC 14.0* 14.4*  --  11.9*  NEUTROABS 12.5*  --   --  8.7*  HGB 10.3* 9.5* 10.5* 9.2*  HCT 34.0* 32.7* 31.0* 30.7*  MCV 72.3* 73.2*  --  71.6*  PLT 303 273  --  440    Basic Metabolic Panel: Recent Labs  Lab 08/25/20 1230 08/26/20 0252 08/26/20 0315 08/27/20 0841 08/28/20 0500  NA 138 138 139 139 139  K 4.0 3.8 4.0 4.0 3.4*  CL 105 103  --  102 98  CO2 24 24  --  25 27  GLUCOSE 131* 114*  --  122* 99  BUN 13 14  --  10 19  CREATININE 0.95 1.02*  --  0.80 1.06*  CALCIUM 9.2 9.1  --  9.0 9.5  MG  --  2.3  --   --  2.1  PHOS  --  4.7*  --   --   --      Studies: No results found.  Scheduled Meds:  amLODipine  10 mg Oral Daily   aspirin  81 mg Oral Daily   benzonatate  100 mg Oral TID   Chlorhexidine Gluconate Cloth  6 each Topical Daily   cloNIDine  0.1 mg Oral BID   dextromethorphan-guaiFENesin  1 tablet Oral BID   docusate  100 mg Oral BID   donepezil  10 mg Oral QHS   heparin  5,000 Units Subcutaneous Q8H   ipratropium-albuterol  3 mL Nebulization BID   lamoTRIgine  100 mg Oral q morning   lamoTRIgine  125 mg Oral QHS   methylPREDNISolone (SOLU-MEDROL) injection  40 mg Intravenous Daily   pantoprazole  40 mg Oral Daily   QUEtiapine  12.5 mg Oral BID   Continuous Infusions:  sodium chloride 10 mL/hr at 08/28/20 1400   ampicillin-sulbactam (UNASYN) IV Stopped (08/28/20  1357)   PRN Meds: haloperidol lactate, labetalol, LORazepam  Time spent: 35 minutes  Author: Berle Mull, MD [facility] 08/28/2020 6:51 PM  To reach On-call, see care teams to locate the attending and reach out via [email or web address]. Between 7PM-7AM, please contact night-coverage If you still have difficulty reaching the attending provider, please page the [facility] (Director on Call) for [facility] on amion for assistance.

## 2020-08-29 LAB — COMPREHENSIVE METABOLIC PANEL
ALT: 49 U/L — ABNORMAL HIGH (ref 0–44)
AST: 42 U/L — ABNORMAL HIGH (ref 15–41)
Albumin: 2.9 g/dL — ABNORMAL LOW (ref 3.5–5.0)
Alkaline Phosphatase: 80 U/L (ref 38–126)
Anion gap: 10 (ref 5–15)
BUN: 22 mg/dL (ref 8–23)
CO2: 29 mmol/L (ref 22–32)
Calcium: 9.6 mg/dL (ref 8.9–10.3)
Chloride: 101 mmol/L (ref 98–111)
Creatinine, Ser: 0.87 mg/dL (ref 0.44–1.00)
GFR, Estimated: >60 mL/min (ref >60)
Glucose, Bld: 125 mg/dL — ABNORMAL HIGH (ref 70–99)
Potassium: 3.6 mmol/L (ref 3.5–5.1)
Sodium: 140 mmol/L (ref 135–145)
Total Bilirubin: 0.5 mg/dL (ref 0.3–1.2)
Total Protein: 6.7 g/dL (ref 6.5–8.1)

## 2020-08-29 LAB — PROCALCITONIN: Procalcitonin: <0.1 ng/mL

## 2020-08-29 MED ORDER — PREDNISONE 5 MG PO TABS
30.0000 mg | ORAL_TABLET | Freq: Every day | ORAL | Status: DC
  Administered 2020-08-30: 30 mg via ORAL
  Filled 2020-08-29: qty 2

## 2020-08-29 MED ORDER — AMOXICILLIN-POT CLAVULANATE 875-125 MG PO TABS
1.0000 | ORAL_TABLET | Freq: Two times a day (BID) | ORAL | Status: DC
  Administered 2020-08-29 – 2020-08-31 (×5): 1 via ORAL
  Filled 2020-08-29 (×5): qty 1

## 2020-08-29 NOTE — Progress Notes (Addendum)
  Speech Language Pathology Treatment: Dysphagia  Patient Details Name: Sandra Charles MRN: [id number] DOB: 07-26-1955 Today's Date: 08/29/2020 Time: 5697-9480 SLP Time Calculation (min) (ACUTE ONLY): 13 min  Assessment / Plan / Recommendation Clinical Impression  Pt was seen for dysphagia treatment. She was alert and cooperative throughout the session. Coughing and throat clearing were frequently noted at baseline. Pt reported a "tightening" in her chest which preceeds her coughing; RN present and aware. Pt tolerated regular texture solids without adequate mastication and oral clearance. Coughing was inconsistently noted with regular textures, with thin liquids via straw, and with pills given (by RN) whole with thin liquids. Considering pt's baseline cough, SLP questions the relatedness of all instances of coughing to swallowing. Pt's diet will be advanced to regular solids and thin liquids. SLP will follow to ensure diet tolerance and for further intervention if clinically indicated.    HPI HPI: Patient is a 65 y.o. female with PMH: HTN, HLD, TIA, obesity, depression, anxiety, seizure disorder with admissions for breakthrough seizures. She presented to [facility] on 6/30 with seizure, waxing and waning mental status and acute hypoxemia. In ER, she became progressively confused, not able to protect her airway and required emergent intubation. She transfered to [facility] on 7/1 for continuous EEG monitoroing. She extubated on 7/1. CXR on 6/30 revealed low lung volumes, Diffuse bilateral pulmonary infiltrates/edema however CXR on 7/2 showed continued interval improvement in subtle airspace opacities within the left upper lobe.      SLP Plan  Continue with current plan of care       Recommendations  Diet recommendations: Regular;Thin liquid Liquids provided via: Cup;Straw Medication Administration: Whole meds with liquid Supervision: Patient able to self feed Compensations: Slow rate;Small  sips/bites Postural Changes and/or Swallow Maneuvers: Seated upright 90 degrees                Oral Care Recommendations: Oral care BID;Staff/trained caregiver to provide oral care Follow up Recommendations: None SLP Visit Diagnosis: Dysphagia, unspecified (R13.10) Plan: Continue with current plan of care       Marianela Mandrell I. Hardin Negus, [place], [place] Office number [phone] Pager [phone]                Horton Marshall 08/29/2020, 3:09 PM

## 2020-08-29 NOTE — Progress Notes (Signed)
Patient arrived in the unit at 0130 am from 4NICU, A&O 2-3, denied of pain,  no s/s distress, situated in a bed comfortably, initiated Telemetry monitoring, continuous Pulse ox monitoring, receiving 3L o2 via [place], appropriate precautions is in place, and will continue to monitor closely.

## 2020-08-29 NOTE — Plan of Care (Signed)
  Problem: Education: Goal: Knowledge of General Education information will improve Description: Including pain rating scale, medication(s)/side effects and non-pharmacologic comfort measures Outcome: Progressing   Problem: Clinical Measurements: Goal: Ability to maintain clinical measurements within normal limits will improve Outcome: Progressing Goal: Diagnostic test results will improve Outcome: Progressing Goal: Respiratory complications will improve Outcome: Progressing Goal: Cardiovascular complication will be avoided Outcome: Progressing   Problem: Activity: Goal: Risk for activity intolerance will decrease Outcome: Progressing   Problem: Nutrition: Goal: Adequate nutrition will be maintained Outcome: Progressing   Problem: Elimination: Goal: Will not experience complications related to bowel motility Outcome: Progressing Goal: Will not experience complications related to urinary retention Outcome: Progressing   Problem: Safety: Goal: Ability to remain free from injury will improve Outcome: Progressing   Problem: Skin Integrity: Goal: Risk for impaired skin integrity will decrease Outcome: Progressing   Problem: Safety: Goal: Non-violent Restraint(s) Outcome: Progressing

## 2020-08-29 NOTE — Progress Notes (Signed)
PT Cancellation Note  Patient Details Name: Sandra Charles MRN: [id number] DOB: 1955/08/21   Cancelled Treatment:    Reason Eval/Treat Not Completed: Patient declined, no reason specified.  Pt paranoid and not willing for this therapist to come close for fear of being "taken away" 08/29/2020  Sandra Carne., PT Acute Rehabilitation Services [phone]  (pager) [phone]  (office).   Sandra Charles 08/29/2020, 5:55 PM

## 2020-08-29 NOTE — Progress Notes (Signed)
[name] called to say patient called her and was acting off and to ask MD about getting pt Rx for Haldol at discharge. RN checked in on patient at 1755 and PT was with her about to assess her as she was sitting on edge of bed. PT left bedside and at approx 1800 Nurse Secretary and NT alerted RN that patient was attempting to get up without staff.   [place] went to bedside and found NT and RN Juliann Pulse trying to calm patient. Patient became increasingly agitated and paranoid stated " they're talking bad about me across the hall" and when asked who was talking bad stated "don't play with me"  patient also stated " I don't trust staff" and when offered to help her ambulate to restroom, "don't touch me, you can't keep me here against my will"  and stated staff was "going to lock her in her room", " handcuff her" and asked family on the phone to call police. per Neurology MD note pt has hx schizoaffective disorder. After multiple attempts to calm patient including calling son and niece per patient request, PRN haldol was administered at approx 1830. At [place] patient resting calmly in bed stated " I am going to go to sleep"

## 2020-08-29 NOTE — Progress Notes (Signed)
[facility] Progress Note  Patient: Sandra Charles    ZOX:[id number]  DOA: 08/26/2020     Date of Service: the patient was seen and examined on 08/29/2020  Brief hospital course: Past medical history of HTN, HLD, TIA, depression, anxiety, seizure disorder with admissions for breakthrough seizures, obesity. Presents to [facility] on 6/30 after having 2 seizures at home. On arrival to the ER patient became progressively confused, not able to protect her airway requiring intubation. Neurology consulted.  Loaded with IV Keppra.  Transferred to [facility] for continuous EEG monitoring. 7/1 midnight admitted to the ICU at [facility] 7/1 extubated during the day 7/2 transferred to [facility]   Currently plan is continue to treat acute encephalopathy.  Assessment and Plan: 1.  Recurrent breakthrough seizure Acute metabolic encephalopathy History of seizure disorder Neurology consulted. Etiology of breakthrough seizure is not clear. Neurology consulted. Lamotrigine level ordered but currently pending. EEG negative for any acute seizure activity and shows encephalopathy. Loaded with IV Keppra load currently discontinued. Home Vimpat dose increased 100 in the morning 125. Follow-up with [facility] neurologist outpatient in 6 to 8 weeks. With ongoing agitation currently will be on IV Haldol and twice a day Seroquel.  2.  Acute hypoxic and hypercarbic respiratory failure, present on admission Aspiration pneumonia Suspected OSA On admission 87% on room air.  Requiring 2 LPM oxygen.  Later on required intubation. Currently extubated.  Respiratory distress improving. Chest x-ray shows vascular congestion. Was given IV Lasix.  Transition to p.o. Lasix. Continue DuoNebs. Started on IV steroids.  Will taper tomorrow. Already on IV antibiotics for aspiration. Monitor on progressive care unit. With patient's obesity patient remains at risk for OSA.  ABG after extubation actually shows morning hypercarbia.   Daughter also reports that the patient is a heavy sleeper.  Recommend outpatient work-up  3.  Accelerated hypertension Blood pressure uncontrolled. Resuming home regimen other than propranolol.  4.  HLD, TIA. Continue aspirin.  Continue Crestor.  5.  Anxiety, depression, dementia Resuming some of the home medication.  6.  Obesity Placing the patient at high risk of poor outcome. Body mass index is 35.97 kg/m.   7.  Chest pain. Troponins are negative.  EKG negative for ischemia.  Monitor.  Diet: Dysphagia 3 diet, speech therapy consulted. DVT Prophylaxis:   heparin injection 5,000 Units Start: 08/26/20 1400 SCDs Start: 08/26/20 0052    Advance goals of care discussion: Full code  Family Communication: no family was present at bedside, at the time of interview.   Disposition:  Status is: Inpatient  Remains inpatient appropriate because:IV treatments appropriate due to intensity of illness or inability to take PO and Inpatient level of care appropriate due to severity of illness  Dispo: The patient is from: Home              Anticipated d/c is to: Home              Patient currently is not medically stable to d/c.   Difficult to place patient No  Subjective: Mentation improving.  No nausea no vomiting.  No fever no chills.  Does not remember when she saw me last.  No acute events overnight.  Physical Exam:  General: Appear in mild distress, no Rash; Oral Mucosa Clear, moist. no Abnormal Neck Mass Or lumps, Conjunctiva normal  Cardiovascular: S1 and S2 Present, no Murmur, Respiratory: good respiratory effort, Bilateral Air entry present and CTA, no Crackles, no wheezes Abdomen: Bowel Sound present, Soft and no  tenderness Extremities: no Pedal edema Neurology: alert and oriented to time, place, and person affect appropriate. no new focal deficit, asterixis negative Gait not checked due to patient safety concerns   Vitals:   08/29/20 0810 08/29/20 0915 08/29/20 1139  08/29/20 1619  BP: (!) 148/68  (!) 153/68 (!) 162/61  Pulse: 81 91 82 (!) 104  Resp: 18 18 18 16   Temp: 98 F (36.7 C)  (!) 97.3 F (36.3 C) 98.3 F (36.8 C)  TempSrc:   Oral Oral  SpO2: 100% 100%  99%  Weight:      Height:       Filed Weights   08/27/20 0400 08/29/20 0309  Weight: 87.9 kg 89.2 kg    Data Reviewed: I have personally reviewed and interpreted daily labs, tele strips, imaging. I reviewed all nursing notes, pharmacy notes, vitals, pertinent old records I have discussed plan of care as described above with RN and patient/family.  CBC: Recent Labs  Lab 08/25/20 1230 08/26/20 0252 08/26/20 0315 08/28/20 0500  WBC 14.0* 14.4*  --  11.9*  NEUTROABS 12.5*  --   --  8.7*  HGB 10.3* 9.5* 10.5* 9.2*  HCT 34.0* 32.7* 31.0* 30.7*  MCV 72.3* 73.2*  --  71.6*  PLT 303 273  --  295    Basic Metabolic Panel: Recent Labs  Lab 08/25/20 1230 08/26/20 0252 08/26/20 0315 08/27/20 0841 08/28/20 0500 08/29/20 0620  NA 138 138 139 139 139 140  K 4.0 3.8 4.0 4.0 3.4* 3.6  CL 105 103  --  102 98 101  CO2 24 24  --  25 27 29   GLUCOSE 131* 114*  --  122* 99 125*  BUN 13 14  --  10 19 22   CREATININE 0.95 1.02*  --  0.80 1.06* 0.87  CALCIUM 9.2 9.1  --  9.0 9.5 9.6  MG  --  2.3  --   --  2.1  --   PHOS  --  4.7*  --   --   --   --      Studies: No results found.  Scheduled Meds:  amLODipine  10 mg Oral Daily   amoxicillin-clavulanate  1 tablet Oral Q12H   aspirin  81 mg Oral Daily   benzonatate  100 mg Oral TID   cloNIDine  0.1 mg Oral BID   dextromethorphan-guaiFENesin  1 tablet Oral BID   docusate  100 mg Oral BID   donepezil  10 mg Oral QHS   heparin  5,000 Units Subcutaneous Q8H   ipratropium-albuterol  3 mL Nebulization BID   lamoTRIgine  100 mg Oral q morning   lamoTRIgine  125 mg Oral QHS   pantoprazole  40 mg Oral Daily   [START ON 08/30/2020] predniSONE  30 mg Oral Q breakfast   QUEtiapine  12.5 mg Oral BID   Continuous Infusions:  sodium  chloride Stopped (08/29/20 0101)   PRN Meds: haloperidol lactate, labetalol, LORazepam  Time spent: 35 minutes  Author: Berle Mull, MD [facility] 08/29/2020 6:38 PM  To reach On-call, see care teams to locate the attending and reach out via [email or web address]. Between 7PM-7AM, please contact night-coverage If you still have difficulty reaching the attending provider, please page the [facility] (Director on Call) for [facility] on amion for assistance.

## 2020-08-29 NOTE — Progress Notes (Signed)
Patient refused her Heparin.

## 2020-08-30 LAB — CULTURE, BLOOD (ROUTINE X 2)
Culture: NO GROWTH
Culture: NO GROWTH
Special Requests: ADEQUATE
Special Requests: ADEQUATE

## 2020-08-30 LAB — COMPREHENSIVE METABOLIC PANEL
ALT: 60 U/L — ABNORMAL HIGH (ref 0–44)
AST: 56 U/L — ABNORMAL HIGH (ref 15–41)
Albumin: 3.3 g/dL — ABNORMAL LOW (ref 3.5–5.0)
Alkaline Phosphatase: 76 U/L (ref 38–126)
Anion gap: 8 (ref 5–15)
BUN: 19 mg/dL (ref 8–23)
CO2: 29 mmol/L (ref 22–32)
Calcium: 9.8 mg/dL (ref 8.9–10.3)
Chloride: 98 mmol/L (ref 98–111)
Creatinine, Ser: 0.81 mg/dL (ref 0.44–1.00)
GFR, Estimated: >60 mL/min (ref >60)
Glucose, Bld: 94 mg/dL (ref 70–99)
Potassium: 3.5 mmol/L (ref 3.5–5.1)
Sodium: 135 mmol/L (ref 135–145)
Total Bilirubin: 0.6 mg/dL (ref 0.3–1.2)
Total Protein: 7.4 g/dL (ref 6.5–8.1)

## 2020-08-30 LAB — MAGNESIUM: Magnesium: 2.1 mg/dL (ref 1.7–2.4)

## 2020-08-30 MED ORDER — WHITE PETROLATUM EX OINT
TOPICAL_OINTMENT | CUTANEOUS | Status: AC
  Filled 2020-08-30: qty 28.35

## 2020-08-30 MED ORDER — QUETIAPINE FUMARATE ER 50 MG PO TB24
50.0000 mg | ORAL_TABLET | Freq: Every day | ORAL | Status: DC
  Administered 2020-08-30: 50 mg via ORAL
  Filled 2020-08-30 (×2): qty 1

## 2020-08-30 MED ORDER — ENOXAPARIN SODIUM 40 MG/0.4ML IJ SOSY
40.0000 mg | PREFILLED_SYRINGE | Freq: Every day | INTRAMUSCULAR | Status: DC
  Administered 2020-08-31: 40 mg via SUBCUTANEOUS
  Filled 2020-08-30: qty 0.4

## 2020-08-30 MED ORDER — IPRATROPIUM-ALBUTEROL 0.5-2.5 (3) MG/3ML IN SOLN: 3.0000 mL | Freq: Four times a day (QID) | RESPIRATORY_TRACT | Status: DC | PRN

## 2020-08-30 MED ORDER — PREDNISONE 20 MG PO TABS
20.0000 mg | ORAL_TABLET | Freq: Every day | ORAL | Status: DC
  Administered 2020-08-31: 20 mg via ORAL
  Filled 2020-08-30: qty 1

## 2020-08-30 MED ORDER — SERTRALINE HCL 100 MG PO TABS
100.0000 mg | ORAL_TABLET | Freq: Every day | ORAL | Status: DC
  Administered 2020-08-30 – 2020-08-31 (×2): 100 mg via ORAL
  Filled 2020-08-30 (×2): qty 1

## 2020-08-30 NOTE — Progress Notes (Signed)
[facility] Progress Note  Patient: Sandra Charles    NLG:[id number]  DOA: 08/26/2020     Date of Service: the patient was seen and examined on 08/30/2020  Brief hospital course: Past medical history of HTN, HLD, TIA, depression, anxiety, seizure disorder with admissions for breakthrough seizures, obesity. Presents to [facility] on 6/30 after having 2 seizures at home. On arrival to the ER patient became progressively confused, not able to protect her airway requiring intubation. Neurology consulted.  Loaded with IV Keppra.  Transferred to [facility] for continuous EEG monitoring. 7/1 midnight admitted to the ICU at [facility] 7/1 extubated during the day 7/2 transferred to [facility]   Currently plan is continue to treat acute encephalopathy.  Assessment and Plan: 1.  Recurrent breakthrough seizure Acute metabolic encephalopathy History of seizure disorder Neurology consulted. Etiology of breakthrough seizure is not clear. Neurology consulted. Lamotrigine level ordered but currently pending. EEG negative for any acute seizure activity and shows encephalopathy. Loaded with IV Keppra load currently discontinued. Home Vimpat dose increased 100 in the morning 125. Follow-up with [facility] neurologist outpatient in 6 to 8 weeks.  2.  Acute hypoxic and hypercarbic respiratory failure, present on admission Aspiration pneumonia Suspected OSA On admission 87% on room air.  Requiring 2 LPM oxygen.  Later on required intubation. Currently extubated.  Respiratory distress improving. Chest x-ray shows vascular congestion. Was given IV Lasix.  Transition to p.o. Lasix. Continue DuoNebs. Started on IV steroids.  Will taper tomorrow. Already on IV antibiotics for aspiration. Monitor on progressive care unit. With patient's obesity patient remains at risk for OSA.  ABG after extubation actually shows morning hypercarbia.  Daughter also reports that the patient is a heavy sleeper.  Recommend  outpatient work-up  3.  Accelerated hypertension Blood pressure uncontrolled. Resuming home regimen other than propranolol.  4.  HLD, TIA. Continue aspirin.  Continue Crestor.  5.  Anxiety, depression, dementia Hospital induced delirium. Resuming some of the home medication. Patient was on Seroquel at home 50 mg nightly. In the hospital was receiving 25 mg last 24 hours. Will resume home regimen. Continue Zoloft as well.  6.  Obesity Placing the patient at high risk of poor outcome. Body mass index is 34.6 kg/m.   7.  Chest pain. Troponins are negative.  EKG negative for ischemia.  Monitor.  Diet: Dysphagia 3 diet, speech therapy consulted. DVT Prophylaxis:   enoxaparin (LOVENOX) injection 40 mg Start: 08/30/20 1015 SCDs Start: 08/26/20 0052    Advance goals of care discussion: Full code  Family Communication: no family was present at bedside, at the time of interview.  Discussed with niece on the phone.  Disposition:  Status is: Inpatient  Remains inpatient appropriate because:IV treatments appropriate due to intensity of illness or inability to take PO and Inpatient level of care appropriate due to severity of illness  Dispo: The patient is from: Home              Anticipated d/c is to: Home              Patient currently is not medically stable to d/c.   Difficult to place patient No  Subjective: Mentation significantly improving during the daytime.  No nausea no vomiting.  No fever no chills.  No headache.  Physical Exam:  General: Appear in mild distress, no Rash; Oral Mucosa Clear, moist. no Abnormal Neck Mass Or lumps, Conjunctiva normal  Cardiovascular: S1 and S2 Present, no Murmur, Respiratory: good respiratory effort, Bilateral  Air entry present and CTA, no Crackles, no wheezes Abdomen: Bowel Sound present, Soft and no tenderness Extremities: no Pedal edema Neurology: alert and oriented to time, place, and person affect appropriate. no new focal  deficit Gait not checked due to patient safety concerns  Vitals:   08/30/20 0400 08/30/20 0500 08/30/20 0751 08/30/20 1607  BP: (!) 179/67  (!) 176/88 (!) 184/83  Pulse: 84  86 (!) 105  Resp: 18  18 18   Temp: 98.8 F (37.1 C)  98 F (36.7 C) 98.2 F (36.8 C)  TempSrc: Oral  Oral   SpO2: 98%  96% 96%  Weight:  85.8 kg    Height:       Filed Weights   08/27/20 0400 08/29/20 0309 08/30/20 0500  Weight: 87.9 kg 89.2 kg 85.8 kg    Data Reviewed: I have personally reviewed and interpreted daily labs, tele strips, imaging. I reviewed all nursing notes, pharmacy notes, vitals, pertinent old records I have discussed plan of care as described above with RN and patient/family.  CBC: Recent Labs  Lab 08/25/20 1230 08/26/20 0252 08/26/20 0315 08/28/20 0500  WBC 14.0* 14.4*  --  11.9*  NEUTROABS 12.5*  --   --  8.7*  HGB 10.3* 9.5* 10.5* 9.2*  HCT 34.0* 32.7* 31.0* 30.7*  MCV 72.3* 73.2*  --  71.6*  PLT 303 273  --  301    Basic Metabolic Panel: Recent Labs  Lab 08/26/20 0252 08/26/20 0315 08/27/20 0841 08/28/20 0500 08/29/20 0620 08/30/20 0631  NA 138 139 139 139 140 135  K 3.8 4.0 4.0 3.4* 3.6 3.5  CL 103  --  102 98 101 98  CO2 24  --  25 27 29 29   GLUCOSE 114*  --  122* 99 125* 94  BUN 14  --  10 19 22 19   CREATININE 1.02*  --  0.80 1.06* 0.87 0.81  CALCIUM 9.1  --  9.0 9.5 9.6 9.8  MG 2.3  --   --  2.1  --  2.1  PHOS 4.7*  --   --   --   --   --      Studies: No results found.  Scheduled Meds:  amLODipine  10 mg Oral Daily   amoxicillin-clavulanate  1 tablet Oral Q12H   aspirin  81 mg Oral Daily   benzonatate  100 mg Oral TID   cloNIDine  0.1 mg Oral BID   dextromethorphan-guaiFENesin  1 tablet Oral BID   docusate  100 mg Oral BID   donepezil  10 mg Oral QHS   enoxaparin (LOVENOX) injection  40 mg Subcutaneous Daily   lamoTRIgine  100 mg Oral q morning   lamoTRIgine  125 mg Oral QHS   pantoprazole  40 mg Oral Daily   [START ON 08/31/2020] predniSONE   20 mg Oral Q breakfast   QUEtiapine  50 mg Oral QHS   sertraline  100 mg Oral Daily   Continuous Infusions:  sodium chloride Stopped (08/29/20 0101)   PRN Meds: haloperidol lactate, ipratropium-albuterol, labetalol, LORazepam  Time spent: 35 minutes  Author: Berle Mull, MD [facility] 08/30/2020 7:08 PM  To reach On-call, see care teams to locate the attending and reach out via [email or web address]. Between 7PM-7AM, please contact night-coverage If you still have difficulty reaching the attending provider, please page the [facility] (Director on Call) for [facility] on amion for assistance.

## 2020-08-30 NOTE — Plan of Care (Signed)
  Problem: Education: Goal: Knowledge of General Education information will improve Description: Including pain rating scale, medication(s)/side effects and non-pharmacologic comfort measures Outcome: Progressing   Problem: Clinical Measurements: Goal: Ability to maintain clinical measurements within normal limits will improve Outcome: Progressing Goal: Will remain free from infection Outcome: Progressing Goal: Diagnostic test results will improve Outcome: Progressing Goal: Respiratory complications will improve Outcome: Progressing Goal: Cardiovascular complication will be avoided Outcome: Progressing   Problem: Safety: Goal: Non-violent Restraint(s) Outcome: Progressing

## 2020-08-30 NOTE — Progress Notes (Signed)
  Speech Language Pathology Treatment: Dysphagia  Patient Details Name: Sandra Charles MRN: [id number] DOB: 1955/11/16 Today's Date: 08/30/2020 Time: 8887-5797 SLP Time Calculation (min) (ACUTE ONLY): 27 min  Assessment / Plan / Recommendation Clinical Impression  Pt seen with her breakfast meal for po tolerance .  SLP assisted pt with set up and left toothbrush/paste for her to use after meal.  Observed pt consuming coffee, orange juice, potatoes, eggs with cheese and biscuit with sausage.  She eats at slow rate and masticates thoroughly.  Subtle cough x2 noted during meals after solids -? Due to known GERD.   Otherwise no indications of aspiration nor oral pocketing.  Pt's last CXR improved - and her voice is strong.    No strong evidence of dysphagia noted - recommend continue diet as tolerated.  Of note, pt repeated the same questions during the session - note h/o dementia and pt takes Aricept.  No family present to establish baseline.    SLP will sign off.    HPI HPI: Patient is a 65 y.o. female with PMH: HTN, HLD, TIA, obesity, depression, anxiety, seizure disorder with admissions for breakthrough seizures. She presented to [facility] on 6/30 with seizure, waxing and waning mental status and acute hypoxemia. In ER, she became progressively confused, not able to protect her airway and required emergent intubation. She transfered to [facility] on 7/1 for continuous EEG monitoroing. She extubated on 7/1. CXR on 6/30 revealed low lung volumes, Diffuse bilateral pulmonary infiltrates/edema however CXR on 7/2 showed continued interval improvement in subtle airspace opacities within the left upper lobe.      SLP Plan  All goals met       Recommendations  Diet recommendations: Regular;Thin liquid Liquids provided via: Cup;Straw Medication Administration: Whole meds with liquid Supervision: Patient able to self feed Compensations: Slow rate;Small sips/bites Postural Changes and/or Swallow Maneuvers:  Seated upright 90 degrees;Upright 30-60 min after meal                Oral Care Recommendations: Oral care BID Follow up Recommendations: None SLP Visit Diagnosis: Dysphagia, unspecified (R13.10) Plan: All goals met       GO                Macario Golds 08/30/2020, 9:45 AM Kathleen Lime, MS [facility] SLP Acute Rehab Services Office [phone] Pager [phone]

## 2020-08-30 NOTE — Evaluation (Signed)
Occupational Therapy Evaluation Patient Details Name: Sandra Charles MRN: [id number] DOB: 04/28/1955 Today's Date: 08/30/2020    History of Present Illness  65 yo female presenting to [facility] on 6/30 with seizures at home. Increased confusion and acute hypoxemia, intubated (6/30-7/1) and transfer to [facility]. CT and EEG negative. PMH including  HTN, HLD, TIA, obesity, depression, anxiety, seizure disorder    Clinical Impression   PTA, pt was living with her fiance and was independent. Pt currently requiring Supervision for ADLs and functional mobility. Presenting with decreased safety and balance but feel she is not far from baseline. Recommend dc to home once medically stable per physician. Will continue to follow acutely as admitted to facilitate safe dc.     Follow Up Recommendations  No OT follow up;Supervision - Intermittent    Equipment Recommendations  None recommended by OT    Recommendations for Other Services PT consult     Precautions / Restrictions Precautions Precautions: Fall      Mobility Bed Mobility Overal bed mobility: Needs Assistance Bed Mobility: Supine to Sit     Supine to sit: Supervision     General bed mobility comments: Supervision for rsafety    Transfers Overall transfer level: Needs assistance Equipment used: None Transfers: Sit to/from Stand Sit to Stand: Supervision         General transfer comment: Superivion for safety    Balance Overall balance assessment: No apparent balance deficits (not formally assessed)                                         ADL either performed or assessed with clinical judgement   ADL Overall ADL's : Needs assistance/impaired                                       General ADL Comments: Pt performing ADLs and function mobility Supervision for safety.     Vision Baseline Vision/History: Wears glasses Wears Glasses: Reading only Patient Visual Report: No change from  baseline       Perception     Praxis      Pertinent Vitals/Pain Pain Assessment: No/denies pain     Hand Dominance     Extremity/Trunk Assessment Upper Extremity Assessment Upper Extremity Assessment: Overall WFL for tasks assessed   Lower Extremity Assessment Lower Extremity Assessment: Defer to PT evaluation   Cervical / Trunk Assessment Cervical / Trunk Assessment: Normal   Communication Communication Communication: No difficulties   Cognition Arousal/Alertness: Awake/alert Behavior During Therapy: WFL for tasks assessed/performed Overall Cognitive Status: Within Functional Limits for tasks assessed                                 General Comments: perseverating on taking a shower despite therapist reporting they will talk with nursing. feel she is at baseline   General Comments  HR 112    Exercises     Shoulder Instructions      Home Living Family/patient expects to be discharged to:: Private residence Living Arrangements: Spouse/significant other Available Help at Discharge: Family;Available PRN/intermittently Type of Home: House Home Access: Stairs to enter [employer] of Steps: 3 Entrance Stairs-Rails: None Home Layout: One level     Bathroom Shower/Tub: Hospital doctor  Toilet: Standard     Home Equipment: None          Prior Functioning/Environment Level of Independence: Independent                 OT Problem List: Decreased strength;Decreased activity tolerance;Decreased range of motion;Impaired balance (sitting and/or standing)      OT Treatment/Interventions: Self-care/ADL training;Therapeutic exercise;DME and/or AE instruction;Therapeutic activities;Energy conservation;Patient/family education    OT Goals(Current goals can be found in the care plan section) Acute Rehab OT Goals Patient Stated Goal: Go home OT Goal Formulation: With patient Time For Goal Achievement: 09/13/20 Potential to  Achieve Goals: Good  OT Frequency: Min 2X/week   Barriers to D/C:            Co-evaluation PT/OT/SLP Co-Evaluation/Treatment: Yes Reason for Co-Treatment:  (activity tolerance)   OT goals addressed during session: ADL's and self-care      AM-PAC OT "6 Clicks" Daily Activity     Outcome Measure Help from another person eating meals?: None Help from another person taking care of personal grooming?: A Little Help from another person toileting, which includes using toliet, bedpan, or urinal?: A Little Help from another person bathing (including washing, rinsing, drying)?: A Little Help from another person to put on and taking off regular upper body clothing?: A Little Help from another person to put on and taking off regular lower body clothing?: A Little 6 Click Score: 19   End of Session Equipment Utilized During Treatment: Gait belt Nurse Communication: Mobility status;Other (comment) (Pt would like to take a shower)  Activity Tolerance: Patient tolerated treatment well Patient left: in chair;with call bell/phone within reach;with chair alarm set  OT Visit Diagnosis: Unsteadiness on feet (R26.81);Other abnormalities of gait and mobility (R26.89);Muscle weakness (generalized) (M62.81)                Time: 5686-1683 OT Time Calculation (min): 21 min Charges:  OT General Charges $OT Visit: 1 Visit OT Evaluation $OT Eval Moderate Complexity: [place], OTR/L Acute Rehab Pager: [phone] Office: [place] 08/30/2020, 8:53 AM

## 2020-08-30 NOTE — Evaluation (Signed)
Physical Therapy Evaluation Patient Details Name: Sandra Charles MRN: [id number] DOB: 06-08-1955 Today's Date: 08/30/2020   History of Present Illness  Pt is a 65 y/o female admitted secondary to having two seizures while at home. The first was unwitnessed. Second was a witnessed GTC seizure lasting 20 minutes. PMH including but not limited to seizures, memory dysfunction, anxiety, chronic cannabis use, depression, headaches, HTN, HLD, TIA.   Clinical Impression  Pt presented supine in bed with HOB elevated, awake and willing to participate in therapy session. Prior to admission, pt reported that she was independent with all functional mobility and ADLs. Pt lives with her fiance in a single level home with a few steps to enter. At the time of evaluation, pt overall at a supervision to min guard level for all functional mobility including hallway ambulation without use of an AD. Additionally, pt participated in stair training this session without difficulties. PT will continue to follow pt acutely to progress mobility as tolerated and to ensure a safe d/c home.     Follow Up Recommendations No PT follow up    Equipment Recommendations  None recommended by PT    Recommendations for Other Services       Precautions / Restrictions Precautions Precautions: Fall Restrictions Weight Bearing Restrictions: No      Mobility  Bed Mobility Overal bed mobility: Needs Assistance Bed Mobility: Supine to Sit     Supine to sit: Supervision     General bed mobility comments: Supervision for safety    Transfers Overall transfer level: Needs assistance Equipment used: None Transfers: Sit to/from Stand Sit to Stand: Supervision         General transfer comment: Superivion for safety, no instability noted with transitions  Ambulation/Gait Ambulation/Gait assistance: Min guard Gait Distance (Feet): 150 Feet Assistive device: None Gait Pattern/deviations: Step-through pattern;Decreased  stride length Gait velocity: decreased   General Gait Details: mild instability noted but no overt LOB or need for physical assistance, min guard for safety  Stairs Stairs: Yes Stairs assistance: Min guard Stair Management: Two rails;Alternating pattern;Forwards Number of Stairs: 2 (x2 trials) General stair comments: no instability noted  Wheelchair Mobility    Modified Rankin (Stroke Patients Only)       Balance Overall balance assessment: Needs assistance Sitting-balance support: Feet supported Sitting balance-Leahy Scale: Normal     Standing balance support: During functional activity;No upper extremity supported Standing balance-Leahy Scale: Good                               Pertinent Vitals/Pain Pain Assessment: No/denies pain    Home Living Family/patient expects to be discharged to:: Private residence Living Arrangements: Spouse/significant other Available Help at Discharge: Family;Available PRN/intermittently Type of Home: House Home Access: Stairs to enter Entrance Stairs-Rails: None Entrance Stairs-Number of Steps: 3 Home Layout: One level Home Equipment: None      Prior Function Level of Independence: Independent               Hand Dominance        Extremity/Trunk Assessment   Upper Extremity Assessment Upper Extremity Assessment: Defer to OT evaluation;Overall [facility] for tasks assessed    Lower Extremity Assessment Lower Extremity Assessment: Overall WFL for tasks assessed    Cervical / Trunk Assessment Cervical / Trunk Assessment: Normal  Communication   Communication: No difficulties  Cognition Arousal/Alertness: Awake/alert Behavior During Therapy: WFL for tasks assessed/performed Overall Cognitive Status: Within Functional  Limits for tasks assessed                                 General Comments: perseverating on taking a shower despite therapist reporting they will talk with nursing. feel she is at  baseline      General Comments General comments (skin integrity, edema, etc.): HR 112    Exercises     Assessment/Plan    PT Assessment Patient needs continued PT services  PT Problem List Decreased mobility;Decreased safety awareness;Decreased coordination       PT Treatment Interventions Gait training;Stair training;Functional mobility training;Therapeutic activities;Therapeutic exercise;Balance training;Neuromuscular re-education;Patient/family education    PT Goals (Current goals can be found in the Care Plan section)  Acute Rehab PT Goals Patient Stated Goal: to take a shower PT Goal Formulation: With patient Time For Goal Achievement: 09/13/20 Potential to Achieve Goals: Good    Frequency Min 3X/week   Barriers to discharge        Co-evaluation PT/OT/SLP Co-Evaluation/Treatment: Yes Reason for Co-Treatment: Other (comment) (activity tolerance) PT goals addressed during session: Mobility/safety with mobility;Balance;Strengthening/ROM OT goals addressed during session: ADL's and self-care       AM-PAC PT "6 Clicks" Mobility  Outcome Measure Help needed turning from your back to your side while in a flat bed without using bedrails?: None Help needed moving from lying on your back to sitting on the side of a flat bed without using bedrails?: None Help needed moving to and from a bed to a chair (including a wheelchair)?: None Help needed standing up from a chair using your arms (e.g., wheelchair or bedside chair)?: None Help needed to walk in hospital room?: None Help needed climbing 3-5 steps with a railing? : A Little 6 Click Score: 23    End of Session Equipment Utilized During Treatment: Gait belt Activity Tolerance: Patient tolerated treatment well Patient left: in chair;with call bell/phone within reach;with chair alarm set Nurse Communication: Mobility status PT Visit Diagnosis: Other abnormalities of gait and mobility (R26.89)    Time: 9476-5465 PT  Time Calculation (min) (ACUTE ONLY): 24 min   Charges:   PT Evaluation $PT Eval Low Complexity: 1 Low          Eduard Clos, PT, DPT  Acute Rehabilitation Services Pager [phone] Office [place] 08/30/2020, 10:23 AM

## 2020-08-31 LAB — CULTURE, BLOOD (ROUTINE X 2)
Culture: NO GROWTH
Culture: NO GROWTH
Special Requests: ADEQUATE
Special Requests: ADEQUATE

## 2020-08-31 LAB — COMPREHENSIVE METABOLIC PANEL
ALT: 54 U/L — ABNORMAL HIGH (ref 0–44)
AST: 35 U/L (ref 15–41)
Albumin: 3.2 g/dL — ABNORMAL LOW (ref 3.5–5.0)
Alkaline Phosphatase: 67 U/L (ref 38–126)
Anion gap: 10 (ref 5–15)
BUN: 15 mg/dL (ref 8–23)
CO2: 26 mmol/L (ref 22–32)
Calcium: 9.6 mg/dL (ref 8.9–10.3)
Chloride: 102 mmol/L (ref 98–111)
Creatinine, Ser: 0.82 mg/dL (ref 0.44–1.00)
GFR, Estimated: >60 mL/min (ref >60)
Glucose, Bld: 91 mg/dL (ref 70–99)
Potassium: 3.6 mmol/L (ref 3.5–5.1)
Sodium: 138 mmol/L (ref 135–145)
Total Bilirubin: 0.4 mg/dL (ref 0.3–1.2)
Total Protein: 6.8 g/dL (ref 6.5–8.1)

## 2020-08-31 MED ORDER — BENZONATATE 100 MG PO CAPS
100.0000 mg | ORAL_CAPSULE | Freq: Three times a day (TID) | ORAL | 0 refills | Status: DC
Start: 2020-08-31 — End: 2021-06-27

## 2020-08-31 MED ORDER — LAMOTRIGINE 25 MG PO TABS: 25.0000 mg | ORAL_TABLET | Freq: Every evening | ORAL | 0 refills | Status: DC

## 2020-08-31 MED ORDER — LAMOTRIGINE 100 MG PO TABS: ORAL_TABLET | ORAL | 0 refills | Status: DC

## 2020-08-31 NOTE — Progress Notes (Signed)
Occupational Therapy Treatment Patient Details Name: Sandra Charles MRN: [id number] DOB: February 09, 1956 Today's Date: 08/31/2020    History of present illness Pt is a 65 y/o female admitted secondary to having two seizures while at home. The first was unwitnessed. Second was a witnessed GTC seizure lasting 20 minutes. PMH including but not limited to seizures, memory dysfunction, anxiety, chronic cannabis use, depression, headaches, HTN, HLD, TIA.   OT comments  Pt admitted with seizures. Pt was able to complete bed mobility with supervision, sit to stand transfers with supervision  and room level ambulation with supervision. Pt was able to report they were at a hospital but unable to give specific information. Pt also had difficulties knowing the date. Pt currently with functional limitations due to the deficits listed below (see OT Problem List).  Pt will benefit from skilled OT to increase their safety and independence with ADL and functional mobility for ADL to facilitate discharge to venue listed below.    Follow Up Recommendations  No OT follow up;Supervision - Intermittent    Equipment Recommendations  None recommended by OT    Recommendations for Other Services      Precautions / Restrictions Precautions Precautions: Fall Restrictions Weight Bearing Restrictions: No       Mobility Bed Mobility Overal bed mobility: Needs Assistance Bed Mobility: Supine to Sit     Supine to sit: Supervision     General bed mobility comments: Supervision for safety    Transfers Overall transfer level: Needs assistance Equipment used: None Transfers: Sit to/from Stand Sit to Stand: Supervision         General transfer comment: Superivion for safety, no instability noted with transitions    Balance Overall balance assessment: Needs assistance Sitting-balance support: Feet supported Sitting balance-Leahy Scale: Normal                                     ADL either  performed or assessed with clinical judgement   ADL Overall ADL's : Needs assistance/impaired Eating/Feeding: Set up;Sitting   Grooming: Wash/dry hands;Wash/dry face;Set up;Sitting   Upper Body Bathing: Sitting;Min guard   Lower Body Bathing: Sit to/from stand;Supervison/ safety   Upper Body Dressing : Supervision/safety;Sitting   Lower Body Dressing: Supervision/safety;Sit to/from stand   Toilet Transfer: Min guard;Cueing for safety;Cueing for sequencing   Toileting- Water quality scientist and Hygiene: Min guard;Cueing for safety;Cueing for sequencing;Sit to/from stand         General ADL Comments: Pt performing ADLs and function mobility Supervision for safety.     Vision       Perception     Praxis      Cognition Arousal/Alertness: Awake/alert Behavior During Therapy: WFL for tasks assessed/performed Overall Cognitive Status: Difficult to assess                                 General Comments: Pt has decrease ability to report where they are but repoted hospital        Exercises     Shoulder Instructions       General Comments      Pertinent Vitals/ Pain       Pain Assessment: No/denies pain  Home Living  Prior Functioning/Environment              Frequency  Min 2X/week        Progress Toward Goals  OT Goals(current goals can now be found in the care plan section)  Progress towards OT goals: Progressing toward goals  Acute Rehab OT Goals Patient Stated Goal: to have breakfast OT Goal Formulation: With patient Time For Goal Achievement: 09/13/20 Potential to Achieve Goals: Good ADL Goals Pt Will Perform Grooming: Independently;standing Pt Will Perform Lower Body Dressing: Independently;sit to/from stand Pt Will Transfer to Toilet: Independently;ambulating;regular height toilet Additional ADL Goal #1: Pt will perform simple IADL with Supervision  Plan Discharge  plan remains appropriate    Co-evaluation                 AM-PAC OT "6 Clicks" Daily Activity     Outcome Measure   Help from another person eating meals?: None Help from another person taking care of personal grooming?: A Little Help from another person toileting, which includes using toliet, bedpan, or urinal?: A Little Help from another person bathing (including washing, rinsing, drying)?: A Little Help from another person to put on and taking off regular upper body clothing?: A Little Help from another person to put on and taking off regular lower body clothing?: A Little 6 Click Score: 19    End of Session Equipment Utilized During Treatment: Gait belt  OT Visit Diagnosis: Unsteadiness on feet (R26.81);Other abnormalities of gait and mobility (R26.89);Muscle weakness (generalized) (M62.81)   Activity Tolerance Patient tolerated treatment well   Patient Left in chair;with call bell/phone within reach;with chair alarm set   Nurse Communication Other (comment) (cognition)        Time: 3244-0102 OT Time Calculation (min): 22 min  Charges: OT General Charges $OT Visit: 1 Visit OT Treatments $Self Care/Home Management : 8-22 mins  Joeseph Amor OTR/L  [place]  [phone] office number [phone] pager number    Joeseph Amor 08/31/2020, 9:34 AM

## 2020-08-31 NOTE — Progress Notes (Signed)
Discharge instructions given to patient and son. Bother verbalized instructions. Patient had no concerns.

## 2020-08-31 NOTE — TOC Transition Note (Signed)
Transition of Care [facility]) - CM/SW Discharge Note   Patient Details  Name: Sandra Charles MRN: [id number] Date of Birth: 05-Feb-1956  [facility] CM/SW Contact:  Pollie Friar, RN Phone Number: 08/31/2020, 11:39 AM   Clinical Narrative:    Patient is discharging home with self care. No f/u per PT/OT and no DME needs.  Pt has transport home.   Final next level of care: Home/Self Care Barriers to Discharge: No Barriers Identified   Patient Goals and CMS Choice        Discharge Placement                       Discharge Plan and Services                                     Social Determinants of Health (SDOH) Interventions     Readmission Risk Interventions No flowsheet data found.

## 2020-09-02 NOTE — Discharge Summary (Signed)
[facility] Discharge Summary   Patient: Sandra Charles KZS:[id number]  PCP: Minda Ditto, MD  Date of admission: 08/26/2020   Date of discharge: 08/31/2020     Discharge Diagnoses:  Principal diagnosis Seizures  Active Problems:   Seizures ([place])   Respiratory failure with hypoxia ([place])   Aspiration into airway   Acute pulmonary edema (H[facility])   Tetrahydrocannabinol (THC) dependence ([place])   Admitted From: home Disposition:  Home   Recommendations for Outpatient Follow-up:  PCP: follow up with PCP and Neurology    Follow-up Information     Minda Ditto, MD. Schedule an appointment as soon as possible for a visit in 1 week(s).   Specialty: Family Medicine Contact information: [place] [address] [phone]         Cain Sieve, MD. Schedule an appointment as soon as possible for a visit in 2 month(s).   Specialty: Neurology Contact information: [facility] [id number] [phone]                Discharge Instructions     Diet - low sodium heart healthy   Complete by: As directed    Increase activity slowly   Complete by: As directed        Diet recommendation: Regular diet  Activity: The patient is advised to gradually reintroduce usual activities, as tolerated  Discharge Condition: stable  Code Status: Full code   History of present illness: As per the H and P dictated on admission, "Sandra Charles is a 64 y.o. female who has PMH including but not limited to seizures, memory dysfunction, anxiety, chronic cannabis use, depression, headaches, HTN, HLD, TIA.  She presented to [facility] 6/30 after 2 seizures at home.  First was unwitnessed but husband felt her appearance afterwards was consistent with prior seizures.  Second was a witnessed GTC seizure lasting 20 minutes.   In ED, she was initially alert and oriented but later became confused.  She was also noted to be hypoxic and  was not able to manage / clear her oral secretions.  She was subsequently intubated. She did not have any recurrent obvious seizure like activity in ED.  Hypoxia felt to be 2/2 flash pulmonary edema +/- aspiration.   She was seen by neurology who loaded her with Keppra, continued her home Lamictal, and recommenced transfer to [facility] for cEEG monitoring."  Hospital Course:  Summary of her active problems in the hospital is as following. 1.  Recurrent breakthrough seizure Acute metabolic encephalopathy History of seizure disorder Neurology consulted. Etiology of breakthrough seizure is not clear. Neurology consulted. Lamotrigine level ordered but currently pending. EEG negative for any acute seizure activity and shows encephalopathy. Loaded with IV Keppra, currently discontinued. Home Vimpat dose increased 100 in the morning 125qpm. Follow-up with [facility] neurologist outpatient in 6 to 8 weeks.   2.  Acute hypoxic and hypercarbic respiratory failure, present on admission Aspiration pneumonia Suspected OSA On admission 87% on room air.  Requiring 2 LPM oxygen.  Later on required intubation. Currently extubated.  Respiratory distress improving. Chest x-ray shows vascular congestion. Was given IV Lasix.  Transition to p.o. Lasix. Continue DuoNebs. Started on IV steroids.  tapered off Already on IV antibiotics for aspiration. Switched to PO, completed Antibiotics in hospital With patient's obesity patient remains at risk for OSA.  ABG after extubation actually shows morning hypercarbia.  Daughter also reports that the patient is a heavy  sleeper.  Recommend outpatient work-up   3.  Accelerated hypertension Blood pressure uncontrolled. Resuming home regimen other than propranolol.   4.  HLD, TIA. Continue aspirin.  Continue Crestor.   5.  Anxiety, depression, dementia Hospital induced delirium. Resuming some of the home medication. Patient was on Seroquel at home 50 mg nightly. In the  hospital was receiving 25 mg Will resume home regimen. Continue Zoloft as well.   6.  Obesity Placing the patient at high risk of poor outcome. Body mass index is 35.89 kg/m.    7.  Chest pain. Troponins are negative.  EKG negative for ischemia.  Monitor.  Patient was seen by physical therapy, who recommended Home Health. On the day of the discharge the patient's vitals were stable, and no other new acute medical condition were reported. The patient was felt safe to be discharge at Home with Home health.  Consultants: PCCM Neurology  Procedures: EEG intubated  DISCHARGE MEDICATION: Allergies as of 08/31/2020       Reactions   Lovastatin Other (See Comments)   Unknown reaction        Medication List     TAKE these medications    amLODipine 10 MG tablet Commonly known as: NORVASC Take 10 mg by mouth daily. For high blood pressure   aspirin EC 81 MG tablet Take 81 mg by mouth every morning.   benzonatate 100 MG capsule Commonly known as: TESSALON Take 1 capsule (100 mg total) by mouth 3 (three) times daily.   cloNIDine 0.1 MG tablet Commonly known as: CATAPRES Take 1 tablet (0.1 mg total) by mouth 2 (two) times daily.   donepezil 10 MG tablet Commonly known as: ARICEPT Take 10 mg by mouth at bedtime.   lamoTRIgine 100 MG tablet Commonly known as: LAMICTAL 100 mg in morning and 125 mg in evening. What changed:  how much to take how to take this when to take this additional instructions   lamoTRIgine 25 MG tablet Commonly known as: LAMICTAL Take 1 tablet (25 mg total) by mouth every evening. Along with 100 mg tablet for total of 125 mg in evening. What changed:  how much to take when to take this additional instructions   pantoprazole 40 MG tablet Commonly known as: Protonix Take 1 tablet (40 mg total) by mouth daily. What changed: when to take this   propranolol ER 60 MG 24 hr capsule Commonly known as: INDERAL LA Take 60 mg by mouth daily.    QUEtiapine 50 MG Tb24 24 hr tablet Commonly known as: SEROQUEL XR Take 50 mg by mouth at bedtime.   sertraline 100 MG tablet Commonly known as: ZOLOFT Take 100 mg by mouth daily.   vitamin B-12 1000 MCG tablet Commonly known as: CYANOCOBALAMIN Take 1,000 mcg by mouth daily.   Vitamin D3 125 MCG (5000 UT) Tabs Take 5,000 Units by mouth every morning.        Discharge Exam: Filed Weights   08/29/20 0309 08/30/20 0500 08/31/20 0432  Weight: 89.2 kg 85.8 kg 89 kg   Vitals:   08/31/20 0719 08/31/20 1221  BP: (!) 156/60 136/61  Pulse: 63 87  Resp: 16 18  Temp: 98.3 F (36.8 C) 98.2 F (36.8 C)  SpO2: 92% 96%   General: Appear in mild distress, no Rash; Oral Mucosa Clear, moist. no Abnormal Neck Mass Or lumps, Conjunctiva normal  Cardiovascular: S1 and S2 Present, no Murmur, Respiratory: good respiratory effort, Bilateral Air entry present and CTA, no Crackles, no wheezes  Abdomen: Bowel Sound present, Soft and no tenderness Extremities: no Pedal edema Neurology: alert and oriented to time, place, and person affect appropriate. no new focal deficit Gait not checked due to patient safety concerns   The results of significant diagnostics from this hospitalization (including imaging, microbiology, ancillary and laboratory) are listed below for reference.    Significant Diagnostic Studies: DG Skull 1-3 Views  Result Date: 08/26/2020 CLINICAL DATA:  65 year old female undergoing metal screening prior to MRI. EXAM: SKULL - 1-3 VIEW COMPARISON:  Head CT 08/25/2020. FINDINGS: Intubated. Oral enteric tube in place. No radiopaque foreign body identified. Orbits appear clear. Visualized paranasal sinuses and mastoids are stable and well aerated. No osseous abnormality identified in the skull. Right side cervical spine facet degeneration. IMPRESSION: No metallic foreign body identified about the head. Electronically Signed   By: Genevie Ann M.D.   On: 08/26/2020 04:33   DG Chest 1  View  Result Date: 08/25/2020 CLINICAL DATA:  Check endotracheal tube placement EXAM: CHEST  1 VIEW COMPARISON:  Film from earlier in the same day. FINDINGS: Cardiac shadow is stable. Gastric catheter extends into the stomach. Endotracheal tube is noted 2.3 cm above the carina. Increasing central airspace opacity is noted likely related to worsening edema. No sizable effusion is seen. No bony abnormality is noted. IMPRESSION: Tubes and lines as described above. Increasing central airspace opacity likely related to pulmonary edema. Electronically Signed   By: Inez Catalina M.D.   On: 08/25/2020 16:41   DG Abdomen 1 View  Result Date: 08/25/2020 CLINICAL DATA:  Check gastric catheter placement EXAM: ABDOMEN - 1 VIEW COMPARISON:  None. FINDINGS: Gastric catheter is noted in the stomach. Scattered large and small bowel gas is noted. No obstructive changes are seen. IMPRESSION: Gastric catheter within the stomach. Electronically Signed   By: Inez Catalina M.D.   On: 08/25/2020 16:42   CT Head Wo Contrast  Result Date: 08/25/2020 CLINICAL DATA:  Nontraumatic seizure activity EXAM: CT HEAD WITHOUT CONTRAST TECHNIQUE: Contiguous axial images were obtained from the base of the skull through the vertex without intravenous contrast. COMPARISON:  09/22/2018 FINDINGS: Brain: No evidence of acute infarction, hemorrhage, hydrocephalus, extra-axial collection or mass lesion/mass effect. Vascular: No hyperdense vessel or unexpected calcification. Skull: Normal. Negative for fracture or focal lesion. Sinuses/Orbits: No acute finding. Other: None. IMPRESSION: No acute intracranial abnormality noted. Electronically Signed   By: Inez Catalina M.D.   On: 08/25/2020 15:55   MR BRAIN W WO CONTRAST  Result Date: 08/26/2020 CLINICAL DATA:  65 year old female with seizure. Cognitive impairment. EXAM: MRI HEAD WITHOUT AND WITH CONTRAST TECHNIQUE: Multiplanar, multiecho pulse sequences of the brain and surrounding structures were  obtained without and with intravenous contrast. CONTRAST:  7.30mL GADAVIST GADOBUTROL 1 MMOL/ML IV SOLN COMPARISON:  Head CT 08/25/2020.  Brain MRI 03/28/2018. FINDINGS: Brain: Stable cerebral volume since 2020, within normal limits for age. No restricted diffusion to suggest acute infarction. No midline shift, mass effect, evidence of mass lesion, ventriculomegaly, extra-axial collection or acute intracranial hemorrhage. Cervicomedullary junction and pituitary are within normal limits. Scattered and occasionally patchy mild to moderate for age cerebral white matter T2 and FLAIR hyperintensity is stable since 2020 and in a nonspecific configuration. Mild T2 heterogeneity in the pons is stable. Chronic T2 heterogeneity in the right thalamus most resembles a chronic lacunar infarct (and on series 20, image 27). No cortical encephalomalacia or chronic cerebral blood products identified. Mild T2 heterogeneity in the bilateral basal ganglia may be perivascular spaces. Negative  cerebellum. Thin slice imaging of the temporal lobes. Hippocampal formations appear symmetric and within normal limits. Other mesial temporal lobe structures appear within normal limits. No abnormal enhancement identified.  No dural thickening. Vascular: Major intracranial vascular flow voids are stable since 2020. The major dural venous sinuses are enhancing and appear to be patent. Skull and upper cervical spine: Negative for age visible cervical spine. Visualized bone marrow signal is within normal limits. Sinuses/Orbits: Stable, negative. Other: Intubated. Small volume retained secretions in the nasopharynx. Mastoids remain clear. Grossly normal visible internal auditory structures. IMPRESSION: 1. No acute intracranial abnormality. 2. Stable MRI appearance of the brain since 2020 with mild to moderate for age chronic signal changes, including in the right thalamus, most commonly due to chronic small vessel disease. Electronically Signed   By: Genevie Ann M.D.   On: 08/26/2020 06:48   DG Pelvis Portable  Result Date: 08/26/2020 CLINICAL DATA:  MRI screening EXAM: PORTABLE PELVIS 1-2 VIEWS COMPARISON:  None. FINDINGS: There is no evidence of pelvic fracture or diastasis. No pelvic bone lesions are seen. Temperature probe overlies the expected position of the bladder. No other metallic foreign body identified within the visualized pelvis. IMPRESSION: Metallic temperature probe seen within the expected bladder. Electronically Signed   By: Fidela Salisbury MD   On: 08/26/2020 04:31   DG CHEST PORT 1 VIEW  Result Date: 08/27/2020 CLINICAL DATA:  Cough and shortness of breath. EXAM: PORTABLE CHEST 1 VIEW COMPARISON:  08/26/2020 FINDINGS: Stable cardiomediastinal contours. Interval removal of the ET tube and enteric tube. Decreased lung volumes. No pleural effusion or edema. Continued interval improvement in subtle airspace opacities within the left upper lobe. No new findings. IMPRESSION: Continued interval improvement in subtle airspace opacities within the left upper lobe. Electronically Signed   By: Kerby Moors M.D.   On: 08/27/2020 10:40   DG Chest Port 1 View  Result Date: 08/26/2020 CLINICAL DATA:  Aspiration EXAM: PORTABLE CHEST 1 VIEW COMPARISON:  08/25/2020 FINDINGS: Endotracheal tube tip is at the level of the clavicular heads. The esophageal catheter side port projects within the stomach. Aeration of the lungs is improved. No pleural effusion. IMPRESSION: Improved aeration of the lungs. Electronically Signed   By: Ulyses Jarred M.D.   On: 08/26/2020 01:45   DG Chest Portable 1 View  Result Date: 08/25/2020 CLINICAL DATA:  Weakness. EXAM: PORTABLE CHEST 1 VIEW COMPARISON:  09/22/2018. FINDINGS: Mild cardiomegaly. Chest. Diffuse bilateral pulmonary infiltrates/edema. No pleural effusion or pneumothorax. No acute bony abnormality. IMPRESSION: Mild cardiomegaly. Low lung volumes. Diffuse bilateral pulmonary infiltrates/edema. Electronically Signed    By: Marcello Moores  Register   On: 08/25/2020 13:18   DG Abd Portable 1V  Result Date: 08/26/2020 CLINICAL DATA:  Esophageal catheter position EXAM: PORTABLE ABDOMEN - 1 VIEW COMPARISON:  None. FINDINGS: Side port of the esophageal catheter projects within the stomach. Nonobstructive bowel gas pattern. IMPRESSION: Side port of esophageal catheter in the stomach. Electronically Signed   By: Ulyses Jarred M.D.   On: 08/26/2020 01:54   EEG adult  Result Date: 08/26/2020 Lora Havens, MD     08/26/2020 10:53 AM Patient Name: CORTNEY BEISSEL MRN: [id number] Epilepsy Attending: Lora Havens Referring Physician/Provider: Dr Lesleigh Noe Date: 08/26/2020 Duration: 26.25 mins Patient history:  65 year old female with history of epilepsy who presented with breakthrough seizures.  EEG to evaluate for seizures. Level of alertness: Awake AEDs during EEG study: Keppra, lamotrigine Technical aspects: This EEG study was done with scalp electrodes positioned according  to the 10-20 International system of electrode placement. Electrical activity was acquired at a sampling rate of 500Hz  and reviewed with a high frequency filter of 70Hz  and a low frequency filter of 1Hz . EEG data were recorded continuously and digitally stored. Description: No posterior dominant rhythm was seen. EEG showed continuous generalized polymorphic 5 to 6 Hz theta as well as intermittent 2 to 3 Hz delta slowing. Hyperventilation and photic stimulation were not performed.   ABNORMALITY - Continuous slow, generalized IMPRESSION: This study is suggestive of moderate diffuse encephalopathy, nonspecific etiology. No seizures or epileptiform discharges were seen throughout the recording. Lora Havens   ECHOCARDIOGRAM COMPLETE  Result Date: 08/26/2020    ECHOCARDIOGRAM REPORT   Patient Name:   DEDE DOBESH Date of Exam: 08/26/2020 Medical Rec #:  [id number]    Height:       62.0 in Accession #:    [id number]   Weight:       157.2 lb Date of Birth:  14-Sep-1955     BSA:          1.726 m Patient Age:    58 years     BP:           132/49 mmHg Patient Gender: F            HR:           78 bpm. Exam Location:  Inpatient Procedure: 2D Echo Indications:    acute diastolic chf  History:        Patient has no prior history of Echocardiogram examinations.                 Seizures; Risk Factors:Hypertension and Dyslipidemia.  Sonographer:    Johny Chess Referring Phys: [id number] [place]  1. Left ventricular ejection fraction, by estimation, is 60 to 65%. The left ventricle has normal function. The left ventricle has no regional wall motion abnormalities. Left ventricular diastolic parameters were normal.  2. Right ventricular systolic function is normal. The right ventricular size is normal. There is mildly elevated pulmonary artery systolic pressure.  3. The mitral valve is normal in structure. Trivial mitral valve regurgitation.  4. The aortic valve is grossly normal. Aortic valve regurgitation is not visualized. FINDINGS  Left Ventricle: Left ventricular ejection fraction, by estimation, is 60 to 65%. The left ventricle has normal function. The left ventricle has no regional wall motion abnormalities. The left ventricular internal cavity size was normal in size. There is  no left ventricular hypertrophy. Left ventricular diastolic parameters were normal. Right Ventricle: The right ventricular size is normal. Right vetricular wall thickness was not well visualized. Right ventricular systolic function is normal. There is mildly elevated pulmonary artery systolic pressure. The tricuspid regurgitant velocity  is 2.96 m/s, and with an assumed right atrial pressure of 3 mmHg, the estimated right ventricular systolic pressure is 53.6 mmHg. Left Atrium: Left atrial size was normal in size. Right Atrium: Right atrial size was normal in size. Pericardium: There is no evidence of pericardial effusion. Mitral Valve: The mitral valve is normal in structure. Trivial  mitral valve regurgitation. Tricuspid Valve: The tricuspid valve is grossly normal. Tricuspid valve regurgitation is trivial. Aortic Valve: The aortic valve is grossly normal. Aortic valve regurgitation is not visualized. Aortic valve mean gradient measures 6.0 mmHg. Aortic valve peak gradient measures 11.3 mmHg. Aortic valve area, by VTI measures 1.46 cm. Pulmonic Valve: The pulmonic valve was grossly normal. Pulmonic valve regurgitation is trivial. Aorta: The aortic  root and ascending aorta are structurally normal, with no evidence of dilitation. IAS/Shunts: The atrial septum is grossly normal.  LEFT VENTRICLE PLAX 2D LVIDd:         4.50 cm  Diastology LVIDs:         3.00 cm  LV e' medial:    11.00 cm/s LV PW:         1.10 cm  LV E/e' medial:  10.4 LV IVS:        1.20 cm  LV e' lateral:   14.40 cm/s LVOT diam:     1.60 cm  LV E/e' lateral: 7.9 LV SV:         48 LV SV Index:   28 LVOT Area:     2.01 cm  RIGHT VENTRICLE             IVC RV S prime:     16.30 cm/s  IVC diam: 1.50 cm TAPSE (M-mode): 2.3 cm LEFT ATRIUM             Index       RIGHT ATRIUM           Index LA diam:        3.90 cm 2.26 cm/m  RA Area:     12.10 cm LA Vol (A2C):   49.5 ml 28.68 ml/m RA Volume:   27.20 ml  15.76 ml/m LA Vol (A4C):   43.8 ml 25.38 ml/m LA Biplane Vol: 47.9 ml 27.76 ml/m  AORTIC VALVE AV Area (Vmax):    1.41 cm AV Area (Vmean):   1.43 cm AV Area (VTI):     1.46 cm AV Vmax:           168.00 cm/s AV Vmean:          115.000 cm/s AV VTI:            0.331 m AV Peak Grad:      11.3 mmHg AV Mean Grad:      6.0 mmHg LVOT Vmax:         118.00 cm/s LVOT Vmean:        81.900 cm/s LVOT VTI:          0.241 m LVOT/AV VTI ratio: 0.73  AORTA Ao Root diam: 2.60 cm Ao Asc diam:  2.70 cm MITRAL VALVE                TRICUSPID VALVE MV Area (PHT): 3.53 cm     TR Peak grad:   35.0 mmHg MV Decel Time: 215 msec     TR Vmax:        296.00 cm/s MV E velocity: 114.00 cm/s MV A velocity: 148.00 cm/s  SHUNTS MV E/A ratio:  0.77         Systemic  VTI:  0.24 m                             Systemic Diam: 1.60 cm Mertie Moores MD Electronically signed by Mertie Moores MD Signature Date/Time: 08/26/2020/4:54:42 PM    Final     Microbiology: Recent Results (from the past 240 hour(s))  Resp Panel by RT-PCR (Flu A&B, Covid) Nasopharyngeal Swab     Status: None   Collection Time: 08/25/20  1:59 PM   Specimen: Nasopharyngeal Swab; Nasopharyngeal(NP) swabs in vial transport medium  Result Value Ref Range Status   SARS Coronavirus 2 by RT PCR NEGATIVE NEGATIVE Final  Comment: (NOTE) SARS-CoV-2 target nucleic acids are NOT DETECTED.  The SARS-CoV-2 RNA is generally detectable in upper respiratory specimens during the acute phase of infection. The lowest concentration of SARS-CoV-2 viral copies this assay can detect is 138 copies/mL. A negative result does not preclude SARS-Cov-2 infection and should not be used as the sole basis for treatment or other patient management decisions. A negative result may occur with  improper specimen collection/handling, submission of specimen other than nasopharyngeal swab, presence of viral mutation(s) within the areas targeted by this assay, and inadequate number of viral copies(<138 copies/mL). A negative result must be combined with clinical observations, patient history, and epidemiological information. The expected result is Negative.  Fact Sheet for Patients:  EntrepreneurPulse.com.au  Fact Sheet for Healthcare Providers:  IncredibleEmployment.be  This test is no t yet approved or cleared by the [place] FDA and  has been authorized for detection and/or diagnosis of SARS-CoV-2 by FDA under an Emergency Use Authorization (EUA). This EUA will remain  in effect (meaning this test can be used) for the duration of the COVID-19 declaration under Section 564(b)(1) of the Act, 21 U.S.C.section 360bbb-3(b)(1), unless the authorization is terminated  or revoked  sooner.       Influenza A by PCR NEGATIVE NEGATIVE Final   Influenza B by PCR NEGATIVE NEGATIVE Final    Comment: (NOTE) The Xpert Xpress SARS-CoV-2/FLU/RSV plus assay is intended as an aid in the diagnosis of influenza from Nasopharyngeal swab specimens and should not be used as a sole basis for treatment. Nasal washings and aspirates are unacceptable for Xpert Xpress SARS-CoV-2/FLU/RSV testing.  Fact Sheet for Patients: EntrepreneurPulse.com.au  Fact Sheet for Healthcare Providers: IncredibleEmployment.be  This test is not yet approved or cleared by the [place] FDA and has been authorized for detection and/or diagnosis of SARS-CoV-2 by FDA under an Emergency Use Authorization (EUA). This EUA will remain in effect (meaning this test can be used) for the duration of the COVID-19 declaration under Section 564(b)(1) of the Act, 21 U.S.C. section 360bbb-3(b)(1), unless the authorization is terminated or revoked.  Performed at [facility], [address], [place], [place] [address]   Urine Culture     Status: None   Collection Time: 08/25/20  1:59 PM   Specimen: Urine, Random  Result Value Ref Range Status   Specimen Description   Final    URINE, RANDOM Performed at [facility], [address], [place], [place] [address]    Special Requests   Final    NONE Performed at [facility], [address] [place], [place] [address]    Culture   Final    NO GROWTH Performed at [facility], [place] [address], [place], [place] [address]    Report Status 08/27/2020 FINAL  Final  Blood culture (routine x 2)     Status: None   Collection Time: 08/25/20  7:40 PM   Specimen: BLOOD  Result Value Ref Range Status   Specimen Description BLOOD BLOOD LEFT HAND  Final   Special Requests   Final    BOTTLES DRAWN AEROBIC AND ANAEROBIC Blood Culture adequate volume   Culture   Final    NO GROWTH 5 DAYS Performed  at [facility], [address] [place], [place] [address]    Report Status 08/30/2020 FINAL  Final  Blood culture (routine x 2)     Status: None   Collection Time: 08/25/20  7:51 PM   Specimen: BLOOD  Result  Value Ref Range Status   Specimen Description BLOOD BLOOD RIGHT HAND  Final   Special Requests   Final    BOTTLES DRAWN AEROBIC AND ANAEROBIC Blood Culture adequate volume   Culture   Final    NO GROWTH 5 DAYS Performed at [facility], [address], [place], [place] [address]    Report Status 08/30/2020 FINAL  Final  MRSA Next Gen by PCR, Nasal     Status: None   Collection Time: 08/26/20 12:47 AM   Specimen: Nasal Mucosa; Nasal Swab  Result Value Ref Range Status   MRSA by PCR Next Gen NOT DETECTED NOT DETECTED Final    Comment: (NOTE) The GeneXpert MRSA Assay (FDA approved for NASAL specimens only), is one component of a comprehensive MRSA colonization surveillance program. It is not intended to diagnose MRSA infection nor to guide or monitor treatment for MRSA infections. Test performance is not FDA approved in patients less than 65 years old. Performed at [facility], [place] [address], [place], [place] [address]   Culture, Respiratory w Gram Stain     Status: None   Collection Time: 08/26/20  1:05 AM   Specimen: Tracheal Aspirate; Respiratory  Result Value Ref Range Status   Specimen Description TRACHEAL ASPIRATE  Final   Special Requests NONE  Final   Gram Stain   Final    FEW WBC PRESENT,BOTH PMN AND MONONUCLEAR FEW GRAM POSITIVE COCCI FEW GRAM POSITIVE RODS    Culture   Final    FEW Normal respiratory flora-no Staph aureus or Pseudomonas seen Performed at [facility], [place] [address] [place], [place] [address]    Report Status 08/28/2020 FINAL  Final  Urine culture     Status: None   Collection Time: 08/26/20  2:08 AM   Specimen: Urine, Random  Result Value Ref Range Status   Specimen Description URINE, RANDOM  Final    Special Requests NONE  Final   Culture   Final    NO GROWTH Performed at [facility], [place] [address] [place], [place] [address]    Report Status 08/27/2020 FINAL  Final  Culture, blood (routine x 2)     Status: None   Collection Time: 08/26/20  2:50 AM   Specimen: BLOOD RIGHT WRIST  Result Value Ref Range Status   Specimen Description BLOOD RIGHT WRIST  Final   Special Requests   Final    BOTTLES DRAWN AEROBIC AND ANAEROBIC Blood Culture adequate volume   Culture   Final    NO GROWTH 5 DAYS Performed at [facility], [place] [address], [place], [place] [address]    Report Status 08/31/2020 FINAL  Final  Culture, blood (routine x 2)     Status: None   Collection Time: 08/26/20  2:55 AM   Specimen: BLOOD RIGHT HAND  Result Value Ref Range Status   Specimen Description BLOOD RIGHT HAND  Final   Special Requests AEROBIC BOTTLE ONLY Blood Culture adequate volume  Final   Culture   Final    NO GROWTH 5 DAYS Performed at [facility], [place] [address], [place], [place] [address]    Report Status 08/31/2020 FINAL  Final     Labs: CBC: Recent Labs  Lab 08/28/20 0500  WBC 11.9*  NEUTROABS 8.7*  HGB 9.2*  HCT 30.7*  MCV 71.6*  PLT 468   Basic Metabolic Panel: Recent Labs  Lab 08/27/20 0841 08/28/20 0500 08/29/20 0620 08/30/20  0631 08/31/20 0343  NA 139 139 140 135 138  K 4.0 3.4* 3.6 3.5 3.6  CL 102 98 101 98 102  CO2 25 27 29 29 26   GLUCOSE 122* 99 125* 94 91  BUN 10 19 22 19 15   CREATININE 0.80 1.06* 0.87 0.81 0.82  CALCIUM 9.0 9.5 9.6 9.8 9.6  MG  --  2.1  --  2.1  --    Liver Function Tests: Recent Labs  Lab 08/27/20 0841 08/28/20 0500 08/29/20 0620 08/30/20 0631 08/31/20 0343  AST 51* 43* 42* 56* 35  ALT 54* 51* 49* 60* 54*  ALKPHOS 85 77 80 76 67  BILITOT 0.7 0.9 0.5 0.6 0.4  PROT 7.1 7.5 6.7 7.4 6.8  ALBUMIN 3.4* 3.3* 2.9* 3.3* 3.2*   CBG: No results for input(s): GLUCAP in the last 168 hours.  Time spent: 35  minutes  Signed:  Berle Mull  [facility] 08/31/2020

## 2020-09-06 MED FILL — Ketamine HCl Inj 10 MG/ML: INTRAMUSCULAR | Qty: 14 | Status: AC

## 2020-11-10 ENCOUNTER — Other Ambulatory Visit (HOSPITAL_COMMUNITY): Payer: Self-pay | Admitting: Family Medicine

## 2020-11-10 ENCOUNTER — Other Ambulatory Visit: Payer: Self-pay | Admitting: Family Medicine

## 2020-11-10 DIAGNOSIS — R945 Abnormal results of liver function studies: Secondary | ICD-10-CM

## 2020-11-10 DIAGNOSIS — R7989 Other specified abnormal findings of blood chemistry: Secondary | ICD-10-CM

## 2020-11-17 ENCOUNTER — Ambulatory Visit: Payer: Medicare HMO

## 2020-11-22 ENCOUNTER — Other Ambulatory Visit: Payer: Self-pay

## 2020-11-22 ENCOUNTER — Ambulatory Visit
Admission: RE | Admit: 2020-11-22 | Discharge: 2020-11-22 | Disposition: A | Discharge status: Home / Self Care | Payer: Medicare HMO | Source: Ambulatory Visit | Attending: Family Medicine | Admitting: Family Medicine

## 2020-11-22 DIAGNOSIS — R7989 Other specified abnormal findings of blood chemistry: Secondary | ICD-10-CM

## 2020-11-22 DIAGNOSIS — R945 Abnormal results of liver function studies: Secondary | ICD-10-CM | POA: Diagnosis present

## 2021-01-13 ENCOUNTER — Emergency Department
Admission: EM | Admit: 2021-01-13 | Discharge: 2021-01-13 | Disposition: A | Discharge status: Home / Self Care | Payer: Medicare HMO | Attending: Emergency Medicine | Admitting: Emergency Medicine

## 2021-01-13 DIAGNOSIS — Z79899 Other long term (current) drug therapy: Secondary | ICD-10-CM | POA: Diagnosis not present

## 2021-01-13 DIAGNOSIS — Z87891 Personal history of nicotine dependence: Secondary | ICD-10-CM | POA: Diagnosis not present

## 2021-01-13 DIAGNOSIS — Y900 Blood alcohol level of less than 20 mg/100 ml: Secondary | ICD-10-CM | POA: Insufficient documentation

## 2021-01-13 DIAGNOSIS — Z7982 Long term (current) use of aspirin: Secondary | ICD-10-CM | POA: Insufficient documentation

## 2021-01-13 DIAGNOSIS — I10 Essential (primary) hypertension: Secondary | ICD-10-CM | POA: Diagnosis not present

## 2021-01-13 DIAGNOSIS — R569 Unspecified convulsions: Secondary | ICD-10-CM | POA: Insufficient documentation

## 2021-01-13 DIAGNOSIS — F0390 Unspecified dementia without behavioral disturbance: Secondary | ICD-10-CM | POA: Insufficient documentation

## 2021-01-13 LAB — URINALYSIS, COMPLETE (UACMP) WITH MICROSCOPIC
Bilirubin Urine: NEGATIVE
Glucose, UA: NEGATIVE mg/dL
Ketones, ur: NEGATIVE mg/dL
Leukocytes,Ua: NEGATIVE
Nitrite: NEGATIVE
Protein, ur: 30 mg/dL — AB
Specific Gravity, Urine: 1.024 (ref 1.005–1.030)
WBC, UA: NONE SEEN WBC/hpf (ref 0–5)
pH: 5 (ref 5.0–8.0)

## 2021-01-13 LAB — COMPREHENSIVE METABOLIC PANEL
ALT: 63 U/L — ABNORMAL HIGH (ref 0–44)
AST: 45 U/L — ABNORMAL HIGH (ref 15–41)
Albumin: 4 g/dL (ref 3.5–5.0)
Alkaline Phosphatase: 109 U/L (ref 38–126)
Anion gap: 10 (ref 5–15)
BUN: 14 mg/dL (ref 8–23)
CO2: 25 mmol/L (ref 22–32)
Calcium: 8.9 mg/dL (ref 8.9–10.3)
Chloride: 103 mmol/L (ref 98–111)
Creatinine, Ser: 0.86 mg/dL (ref 0.44–1.00)
GFR, Estimated: >60 mL/min (ref >60)
Glucose, Bld: 120 mg/dL — ABNORMAL HIGH (ref 70–99)
Potassium: 3.5 mmol/L (ref 3.5–5.1)
Sodium: 138 mmol/L (ref 135–145)
Total Bilirubin: 0.6 mg/dL (ref 0.3–1.2)
Total Protein: 7.9 g/dL (ref 6.5–8.1)

## 2021-01-13 LAB — URINE DRUG SCREEN, QUALITATIVE (ARMC ONLY)
Amphetamines, Ur Screen: NOT DETECTED
Barbiturates, Ur Screen: NOT DETECTED
Benzodiazepine, Ur Scrn: NOT DETECTED
Cannabinoid 50 Ng, Ur ~~LOC~~: POSITIVE — AB
Cocaine Metabolite,Ur ~~LOC~~: NOT DETECTED
MDMA (Ecstasy)Ur Screen: NOT DETECTED
Methadone Scn, Ur: NOT DETECTED
Opiate, Ur Screen: NOT DETECTED
Phencyclidine (PCP) Ur S: NOT DETECTED
Tricyclic, Ur Screen: POSITIVE — AB

## 2021-01-13 LAB — CBC WITH DIFFERENTIAL/PLATELET
Abs Immature Granulocytes: 0.02 10*3/uL (ref 0.00–0.07)
Basophils Absolute: 0 10*3/uL (ref 0.0–0.1)
Basophils Relative: 0 %
Eosinophils Absolute: 0.1 10*3/uL (ref 0.0–0.5)
Eosinophils Relative: 1 %
HCT: 39.1 % (ref 36.0–46.0)
Hemoglobin: 11.5 g/dL — ABNORMAL LOW (ref 12.0–15.0)
Immature Granulocytes: 0 %
Lymphocytes Relative: 27 %
Lymphs Abs: 2 10*3/uL (ref 0.7–4.0)
MCH: 21.5 pg — ABNORMAL LOW (ref 26.0–34.0)
MCHC: 29.4 g/dL — ABNORMAL LOW (ref 30.0–36.0)
MCV: 73.1 fL — ABNORMAL LOW (ref 80.0–100.0)
Monocytes Absolute: 0.5 10*3/uL (ref 0.1–1.0)
Monocytes Relative: 6 %
Neutro Abs: 4.8 10*3/uL (ref 1.7–7.7)
Neutrophils Relative %: 66 %
Platelets: 240 10*3/uL (ref 150–400)
RBC: 5.35 MIL/uL — ABNORMAL HIGH (ref 3.87–5.11)
RDW: 21.9 % — ABNORMAL HIGH (ref 11.5–15.5)
WBC: 7.4 10*3/uL (ref 4.0–10.5)
nRBC: 0 % (ref 0.0–0.2)

## 2021-01-13 LAB — ETHANOL: Alcohol, Ethyl (B): <10 mg/dL (ref <10)

## 2021-01-13 MED ORDER — LEVETIRACETAM IN NACL 1000 MG/100ML IV SOLN
1000.0000 mg | Freq: Once | INTRAVENOUS | Status: AC
  Administered 2021-01-13: 1000 mg via INTRAVENOUS
  Filled 2021-01-13: qty 100

## 2021-01-13 MED ORDER — CLOBAZAM 10 MG PO TABS: 5.0000 mg | ORAL_TABLET | Freq: Every day | ORAL | 1 refills | Status: DC

## 2021-01-13 MED ORDER — LAMOTRIGINE 25 MG PO TABS
150.0000 mg | ORAL_TABLET | Freq: Once | ORAL | Status: AC
  Administered 2021-01-13: 150 mg via ORAL
  Filled 2021-01-13: qty 1

## 2021-01-13 MED ORDER — CLOBAZAM 10 MG PO TABS
5.0000 mg | ORAL_TABLET | Freq: Once | ORAL | Status: AC
  Administered 2021-01-13: 5 mg via ORAL
  Filled 2021-01-13: qty 0.5

## 2021-01-13 NOTE — ED Provider Notes (Signed)
[facility] Emergency Department Provider Note ____________________________________________   Event Date/Time   First MD Initiated Contact with Patient 01/13/21 [phone]     (approximate)  I have reviewed the triage vital signs and the nursing notes.   HISTORY  Chief Complaint Seizures    HPI Sandra Charles is a 65 y.o. female with history of hypertension, hyperlipidemia, CVA, schizoaffective disorder, Lewy body dementia, seizures who presents to the emergency department with EMS after she had a witnessed seizure by her husband that lasted 20 minutes per EMS report.  No further seizure-like activity on EMS arrival.  She was postictal.  She was last seen in our emergency department on 08/25/2020 for seizures and respiratory failure requiring intubation.  Transferred to [facility] and was discharged on 08/31/2020.  It appears she is on Lamictal for her seizures.    It appears patient has not tolerated Depakote or Keppra well in the past.  Also appears she has been on Vimpat but this was also stopped.  She is on Lamictal 125 mg twice daily.  Last saw [facility] neurology by telemedicine visit on 10/14/2020 and that is when her Lamictal was increased from 100 mg in the day and 125 mg at night.     Past Medical History:  Diagnosis Date   Anxiety    Arthritis    "all over"   Chronic lower back pain    Depression    GERD (gastroesophageal reflux disease)    Headache    "weekly" (07/15/2015)   Hyperlipidemia    Hypertension    Mini stroke ([place])    "several since 05/2014" (07/15/2015)   Seizures ([place]) dx'd 04/2015    Patient Active Problem List   Diagnosis Date Noted   Seizures ([place]) 08/26/2020   Respiratory failure with hypoxia ([place])    Aspiration into airway    Acute pulmonary edema (H[facility])    Tetrahydrocannabinol (THC) dependence ([place])    Sepsis ([place]) 09/22/2018   Altered mental status 03/27/2018   HLD (hyperlipidemia) 10/05/2017   Seizure ([place])    UTI  (urinary tract infection) 08/17/2015   Seizure disorder ([place]) 08/10/2015   Cannabis use disorder, moderate, dependence ([place]) 08/10/2015   Schizoaffective disorder ([place]) 08/10/2015   Abnormal finding on MRI of brain    Hypokalemia 07/15/2015   Confusion    GERD (gastroesophageal reflux disease) 11/25/2014   Essential hypertension 11/25/2014    Past Surgical History:  Procedure Laterality Date   DILATION AND CURETTAGE OF UTERUS     LUMBAR PUNCTURE  07/14/2015   TUBAL LIGATION     VAGINAL HYSTERECTOMY      Prior to Admission medications   Medication Sig Start Date End Date Taking? Authorizing Provider  cloBAZam (ONFI) 10 MG tablet Take 0.5 tablets (5 mg total) by mouth at bedtime. 01/13/21  Yes Hawken Bielby N, DO  amLODipine (NORVASC) 10 MG tablet Take 10 mg by mouth daily. For high blood pressure    [provider]  aspirin EC 81 MG tablet Take 81 mg by mouth every morning.     [provider]  benzonatate (TESSALON) 100 MG capsule Take 1 capsule (100 mg total) by mouth 3 (three) times daily. 08/31/20   Lavina Hamman, MD  Cholecalciferol (VITAMIN D3) 5000 UNITS TABS Take 5,000 Units by mouth every morning.     [provider]  cloNIDine (CATAPRES) 0.1 MG tablet Take 1 tablet (0.1 mg total) by mouth 2 (two) times daily. 07/15/15   Oto,  Richard, MD  donepezil (ARICEPT) 10 MG tablet Take 10 mg by mouth at bedtime.    [provider]  lamoTRIgine (LAMICTAL) 100 MG tablet 100 mg in morning and 125 mg in evening. 08/31/20   Lavina Hamman, MD  lamoTRIgine (LAMICTAL) 25 MG tablet Take 1 tablet (25 mg total) by mouth every evening. Along with 100 mg tablet for total of 125 mg in evening. 08/31/20   Lavina Hamman, MD  pantoprazole (PROTONIX) 40 MG tablet Take 1 tablet (40 mg total) by mouth daily. 11/25/14   Wellington Hampshire, MD  propranolol ER (INDERAL LA) 60 MG 24 hr capsule Take 60 mg by mouth daily. 09/15/17   [provider]  QUEtiapine  (SEROQUEL XR) 50 MG TB24 24 hr tablet Take 50 mg by mouth at bedtime.    [provider]  sertraline (ZOLOFT) 100 MG tablet Take 100 mg by mouth daily. 08/30/17   [provider]  vitamin B-12 (CYANOCOBALAMIN) 1000 MCG tablet Take 1,000 mcg by mouth daily.    [provider]    Allergies Lovastatin  Family History  Problem Relation Age of Onset   Hyperlipidemia Mother    Breast cancer Mother 51   Hypertension Mother    Heart attack Mother        age 43's   Hyperlipidemia Father    Hypertension Father    Heart attack Father 48   Breast cancer Sister 42    Social History Social History   Tobacco Use   Smoking status: Former    Packs/day: 1.00    Years: 5.00    Pack years: 5.00    Types: Cigarettes   Smokeless tobacco: Never   Tobacco comments:    "quit smoking cigarettes in the early 2000s"  Substance Use Topics   Alcohol use: Yes    Alcohol/week: 0.0 standard drinks    Comment: 07/15/2015 "nothing in the last few years"   Drug use: No    Review of Systems Constitutional: No fever. Eyes: No visual changes. ENT: No sore throat. Cardiovascular: Denies chest pain. Respiratory: Denies shortness of breath. Gastrointestinal: No nausea, vomiting, diarrhea. Genitourinary: Negative for dysuria. Musculoskeletal: Negative for back pain. Skin: Negative for rash. Neurological: Negative for focal weakness or numbness.   ____________________________________________   PHYSICAL EXAM:  VITAL SIGNS: ED Triage Vitals  Enc Vitals Group     BP 01/13/21 0349 (!) 154/92     Pulse Rate 01/13/21 0349 84     Resp 01/13/21 0349 18     Temp 01/13/21 0346 98.6 F (37 C)     Temp Source 01/13/21 0346 Oral     SpO2 01/13/21 0349 97 %     Weight 01/13/21 0349 174 lb 9.6 oz (79.2 kg)     Height 01/13/21 0349 5\' 2"  (1.575 m)     Head Circumference --      Peak Flow --      Pain Score --      Pain Loc --      Pain Edu? --      Excl. in [place]? --     CONSTITUTIONAL: Alert and oriented to person but not place or time.  Appears postictal. HEAD: Normocephalic; atraumatic EYES: Conjunctivae clear, PERRL, EOMI ENT: normal nose; no rhinorrhea; moist mucous membranes; pharynx without lesions noted; no dental injury; no septal hematoma, has superficial bite marks noted to her tongue NECK: Supple, no meningismus, no LAD; no midline spinal tenderness, step-off or deformity; trachea midline CARD:  RRR; S1 and S2 appreciated; no murmurs, no clicks, no rubs, no gallops RESP: Normal chest excursion without splinting or tachypnea; breath sounds clear and equal bilaterally; no wheezes, no rhonchi, no rales; no hypoxia or respiratory distress CHEST:  chest wall stable, no crepitus or ecchymosis or deformity, nontender to palpation; no flail chest ABD/GI: Normal bowel sounds; non-distended; soft, non-tender, no rebound, no guarding; no ecchymosis or other lesions noted PELVIS:  stable, nontender to palpation BACK:  The back appears normal and is non-tender to palpation, there is no CVA tenderness; no midline spinal tenderness, step-off or deformity EXT: Normal ROM in all joints; non-tender to palpation; no edema; normal capillary refill; no cyanosis, no bony tenderness or bony deformity of patient's extremities, no joint effusion, compartments are soft, extremities are warm and well-perfused, no ecchymosis SKIN: Normal color for age and race; warm NEURO: Moves all extremities equally, normal phonation, no facial asymmetry PSYCH: The patient's mood and manner are appropriate. Grooming and personal hygiene are appropriate.  ____________________________________________   LABS (all labs ordered are listed, but only abnormal results are displayed)  Labs Reviewed  CBC WITH DIFFERENTIAL/PLATELET - Abnormal; Notable for the following components:      Result Value   RBC 5.35 (*)    Hemoglobin 11.5 (*)    MCV 73.1 (*)    MCH 21.5 (*)    MCHC 29.4 (*)    RDW  21.9 (*)    All other components within normal limits  COMPREHENSIVE METABOLIC PANEL - Abnormal; Notable for the following components:   Glucose, Bld 120 (*)    AST 45 (*)    ALT 63 (*)    All other components within normal limits  URINALYSIS, COMPLETE (UACMP) WITH MICROSCOPIC - Abnormal; Notable for the following components:   Color, Urine YELLOW (*)    APPearance HAZY (*)    Hgb urine dipstick SMALL (*)    Protein, ur 30 (*)    Bacteria, UA RARE (*)    All other components within normal limits  URINE DRUG SCREEN, QUALITATIVE ([facility] ONLY) - Abnormal; Notable for the following components:   Tricyclic, Ur Screen POSITIVE (*)    Cannabinoid 50 Ng, Ur [place] POSITIVE (*)    All other components within normal limits  ETHANOL  LAMOTRIGINE LEVEL   ____________________________________________  EKG   EKG Interpretation  Date/Time:  Friday January 13 2021 03:49:55 EST Ventricular Rate:  75 PR Interval:  194 QRS Duration: 93 QT Interval:  384 QTC Calculation: 429 R Axis:   47 Text Interpretation: Sinus rhythm Nonspecific T abnormalities, lateral leads Confirmed by Pryor Curia [phone]) on 01/13/2021 4:55:28 AM        ____________________________________________  RADIOLOGY Jessie Foot Traeger Sultana, personally viewed and evaluated these images (plain radiographs) as part of my medical decision making, as well as reviewing the written report by the radiologist.  ED MD interpretation:    Official radiology report(s): No results found.  ____________________________________________   PROCEDURES  Procedure(s) performed (including Critical Care):  Procedures  CRITICAL CARE Performed by: Pryor Curia   Total critical care time: 45 minutes  Critical care time was exclusive of separately billable procedures and treating other patients.  Critical care was necessary to treat or prevent imminent or life-threatening deterioration.  Critical care was time spent personally by me on the  following activities: development of treatment plan with patient and/or surrogate as well as nursing, discussions with consultants, evaluation of patient's response to treatment, examination of patient, obtaining history from patient or  surrogate, ordering and performing treatments and interventions, ordering and review of laboratory studies, ordering and review of radiographic studies, pulse oximetry and re-evaluation of patient's condition.  ____________________________________________   INITIAL IMPRESSION / ASSESSMENT AND PLAN / ED COURSE  As part of my medical decision making, I reviewed the following data within the [place] History obtained from family, Nursing notes reviewed and incorporated, Labs reviewed , EKG interpreted , Old EKG reviewed, Old chart reviewed, A consult was requested and obtained from this/these consultant(s) Neurology, Notes from prior ED visits, and [place] Controlled Substance Database         Patient here after witnessed seizure-like activity.  No family currently at bedside.  It appears last time she had a seizure and came to our hospital was in June 2022.  It appears she is supposed to be on Lamictal.  Unclear if she is compliant.  We will send off the Lamictal level.  We will check labs, urine.  Blood glucose was normal with EMS.  She has no seizure activity here and is currently postictal.  She has some abrasions to her turn but no laceration that needs repair and no other sign of traumatic injury on exam.  We will continue to closely monitor.  ED PROGRESS  5:10 AM  Patient waking up slowly.  She reports that she lives with her [place].  Attempted to call him twice without any answer.  Left a HIPAA compliant voicemail.  Continues to be hemodynamically stable.  No further seizure-like activity.  Labs reassuring.  Urine pending.  5:25 AM  Spoke with patient's son Eleah Lahaie by phone.  He reports that he is not aware of any infectious  symptoms, drug or alcohol use, stress, decreased sleep or other trigger that could have caused her seizures.  It appears her seizures are relatively well controlled and prior to her seizure in June 2022 her last seizures were in 2020.  He reports that she is compliant with medications as far as he is aware.  No history of substance abuse.  Recommended if patient continues to be stable and is able to be discharged home that she follow-up closely with her [place].  He verbalized understanding.  6:00 Am  Spoke with Dr. Lenny Pastel with [facility] neurology.  Appreciate her help.  She recommends keeping Lamictal at same dose and starting Onfi 5mg  qhs and giving first dose of Onfi here.  She is hesitant to adjust her Lamictal dosing as she had a nondetectable Lamictal level during her admission in June which suggest that patient is not compliant with this medication despite what the patient and the son are telling us today.  Patient can follow-up with her neurologist closely as an outpatient.   At this time, I do not feel there is any life-threatening condition present. I have reviewed, interpreted and discussed all results (EKG, imaging, lab, urine as appropriate) and exam findings with patient/family. I have reviewed nursing notes and appropriate previous records.  I feel the patient is safe to be discharged home without further emergent workup and can continue workup as an outpatient as needed. Discussed usual and customary return precautions. Patient/family verbalize understanding and are comfortable with this plan.  Outpatient follow-up has been provided as needed. All questions have been answered.  ____________________________________________   FINAL CLINICAL IMPRESSION(S) / ED DIAGNOSES  Final diagnoses:  Seizure [facility])     ED Discharge Orders          Ordered  cloBAZam (ONFI) 10 MG tablet  Daily at bedtime        01/13/21 2440            *Please note:  HAROLD MATTES was evaluated  in Emergency Department on 01/13/2021 for the symptoms described in the history of present illness. She was evaluated in the context of the global COVID-19 pandemic, which necessitated consideration that the patient might be at risk for infection with the SARS-CoV-2 virus that causes COVID-19. Institutional protocols and algorithms that pertain to the evaluation of patients at risk for COVID-19 are in a state of rapid change based on information released by regulatory bodies including the CDC and federal and state organizations. These policies and algorithms were followed during the patient's care in the ED.  Some ED evaluations and interventions may be delayed as a result of limited staffing during and the pandemic.*   Note:  This document was prepared using Dragon voice recognition software and may include unintentional dictation errors.    Jourdan Durbin, Delice Bison, DO 01/13/21 [phone]

## 2021-01-13 NOTE — ED Notes (Signed)
New brief placed on patient, help dress in clothing

## 2021-01-13 NOTE — ED Notes (Signed)
Dr Lazaro Arms spoke w/ pt's son . Will be here to pick up this pt

## 2021-01-13 NOTE — ED Notes (Signed)
Called [facility] for consult for K. Zareth Rippetoe, DO

## 2021-01-13 NOTE — Discharge Instructions (Addendum)
Neurology recommends that she continue your Lamictal 125 mg twice daily.  Please make sure that you are taking this medication every day.  We have sent a Lamictal level so that we can better help with adjusting this medication as needed.  Neurology recommends that we start you on a new medication called Onfi that you will take once daily at bedtime along with your Lamictal.  Please call to schedule an appointment for close follow-up with your neurologist.

## 2021-01-13 NOTE — ED Triage Notes (Addendum)
Pt brought in from home per EMS for witnesses seizure by pt's husband per report lasting 2omin. PMHX seizures on meds.  Per EMS pt seen at scene post ictal, EMS reported pt had tongue bite. GCS 14, BGL=156. Pt presents to ED AA, respi even-unlabored. In NAD

## 2021-01-16 LAB — LAMOTRIGINE LEVEL: Lamotrigine Lvl: 6.8 ug/mL (ref 2.0–20.0)

## 2021-01-19 ENCOUNTER — Other Ambulatory Visit: Payer: Self-pay

## 2021-01-19 ENCOUNTER — Emergency Department: Payer: Medicare HMO

## 2021-01-19 ENCOUNTER — Observation Stay: Payer: Medicare HMO

## 2021-01-19 ENCOUNTER — Observation Stay
Admission: EM | Admit: 2021-01-19 | Discharge: 2021-01-26 | Disposition: A | Discharge status: Skilled Nursing Facility | Payer: Medicare HMO | Attending: Internal Medicine | Admitting: Internal Medicine

## 2021-01-19 DIAGNOSIS — Z8673 Personal history of transient ischemic attack (TIA), and cerebral infarction without residual deficits: Secondary | ICD-10-CM | POA: Diagnosis not present

## 2021-01-19 DIAGNOSIS — R262 Difficulty in walking, not elsewhere classified: Secondary | ICD-10-CM | POA: Insufficient documentation

## 2021-01-19 DIAGNOSIS — S82851A Displaced trimalleolar fracture of right lower leg, initial encounter for closed fracture: Secondary | ICD-10-CM | POA: Diagnosis not present

## 2021-01-19 DIAGNOSIS — I10 Essential (primary) hypertension: Secondary | ICD-10-CM | POA: Diagnosis not present

## 2021-01-19 DIAGNOSIS — E876 Hypokalemia: Secondary | ICD-10-CM | POA: Insufficient documentation

## 2021-01-19 DIAGNOSIS — Z87891 Personal history of nicotine dependence: Secondary | ICD-10-CM | POA: Diagnosis not present

## 2021-01-19 DIAGNOSIS — Z9889 Other specified postprocedural states: Secondary | ICD-10-CM

## 2021-01-19 DIAGNOSIS — Z7982 Long term (current) use of aspirin: Secondary | ICD-10-CM | POA: Diagnosis not present

## 2021-01-19 DIAGNOSIS — Z8781 Personal history of (healed) traumatic fracture: Secondary | ICD-10-CM

## 2021-01-19 DIAGNOSIS — E871 Hypo-osmolality and hyponatremia: Secondary | ICD-10-CM

## 2021-01-19 DIAGNOSIS — R4182 Altered mental status, unspecified: Secondary | ICD-10-CM | POA: Diagnosis not present

## 2021-01-19 DIAGNOSIS — Z419 Encounter for procedure for purposes other than remedying health state, unspecified: Secondary | ICD-10-CM

## 2021-01-19 DIAGNOSIS — Z20822 Contact with and (suspected) exposure to covid-19: Secondary | ICD-10-CM | POA: Insufficient documentation

## 2021-01-19 DIAGNOSIS — Z01818 Encounter for other preprocedural examination: Secondary | ICD-10-CM

## 2021-01-19 DIAGNOSIS — W19XXXA Unspecified fall, initial encounter: Secondary | ICD-10-CM | POA: Diagnosis not present

## 2021-01-19 DIAGNOSIS — G40909 Epilepsy, unspecified, not intractable, without status epilepticus: Secondary | ICD-10-CM

## 2021-01-19 DIAGNOSIS — F259 Schizoaffective disorder, unspecified: Secondary | ICD-10-CM | POA: Diagnosis present

## 2021-01-19 DIAGNOSIS — R52 Pain, unspecified: Secondary | ICD-10-CM

## 2021-01-19 DIAGNOSIS — F250 Schizoaffective disorder, bipolar type: Secondary | ICD-10-CM | POA: Diagnosis present

## 2021-01-19 DIAGNOSIS — S99911A Unspecified injury of right ankle, initial encounter: Secondary | ICD-10-CM | POA: Diagnosis present

## 2021-01-19 MED ORDER — HYDRALAZINE HCL 20 MG/ML IJ SOLN
10.0000 mg | Freq: Four times a day (QID) | INTRAMUSCULAR | Status: AC | PRN
  Administered 2021-01-20 (×2): 5 mg via INTRAVENOUS

## 2021-01-19 MED ORDER — PROPOFOL 10 MG/ML IV BOLUS
INTRAVENOUS | Status: AC | PRN
  Administered 2021-01-19: 80 mg via INTRAVENOUS
  Administered 2021-01-19: 40 mg via INTRAVENOUS

## 2021-01-19 MED ORDER — HYDROMORPHONE HCL 1 MG/ML IJ SOLN
0.5000 mg | Freq: Once | INTRAMUSCULAR | Status: AC
  Administered 2021-01-20: 0.5 mg via INTRAVENOUS
  Filled 2021-01-19: qty 1

## 2021-01-19 MED ORDER — CLOBAZAM 10 MG PO TABS
5.0000 mg | ORAL_TABLET | Freq: Every day | ORAL | Status: DC
  Administered 2021-01-20 – 2021-01-25 (×6): 5 mg via ORAL
  Filled 2021-01-19 (×6): qty 1
  Filled 2021-01-19 (×2): qty 0.5

## 2021-01-19 MED ORDER — HYDROCODONE-ACETAMINOPHEN 5-325 MG PO TABS
1.0000 | ORAL_TABLET | Freq: Four times a day (QID) | ORAL | Status: DC | PRN
  Administered 2021-01-20: 2 via ORAL
  Administered 2021-01-20: 1 via ORAL
  Administered 2021-01-21: 2 via ORAL
  Administered 2021-01-22: 12:00:00 1 via ORAL
  Administered 2021-01-22: 06:00:00 2 via ORAL
  Administered 2021-01-23 (×2): 1 via ORAL
  Administered 2021-01-24 – 2021-01-26 (×5): 2 via ORAL
  Filled 2021-01-19: qty 2
  Filled 2021-01-19 (×2): qty 1
  Filled 2021-01-19 (×2): qty 2
  Filled 2021-01-19: qty 1
  Filled 2021-01-19 (×4): qty 2
  Filled 2021-01-19: qty 1
  Filled 2021-01-19 (×5): qty 2

## 2021-01-19 MED ORDER — MORPHINE SULFATE (PF) 4 MG/ML IV SOLN
4.0000 mg | Freq: Once | INTRAVENOUS | Status: AC
  Administered 2021-01-19: 4 mg via INTRAVENOUS
  Filled 2021-01-19: qty 1

## 2021-01-19 MED ORDER — CEFAZOLIN SODIUM-DEXTROSE 2-4 GM/100ML-% IV SOLN
2.0000 g | INTRAVENOUS | Status: AC
  Administered 2021-01-20: 2 g via INTRAVENOUS
  Filled 2021-01-19: qty 100

## 2021-01-19 MED ORDER — SODIUM CHLORIDE 0.9 % IV SOLN: INTRAVENOUS | Status: DC

## 2021-01-19 MED ORDER — LAMOTRIGINE 25 MG PO TABS
25.0000 mg | ORAL_TABLET | Freq: Every evening | ORAL | Status: DC
  Administered 2021-01-20 – 2021-01-25 (×6): 25 mg via ORAL
  Filled 2021-01-19 (×5): qty 1

## 2021-01-19 MED ORDER — SODIUM CHLORIDE 0.9 % IV BOLUS: 1000.0000 mL | Freq: Once | INTRAVENOUS | Status: DC

## 2021-01-19 MED ORDER — HYDRALAZINE HCL 25 MG PO TABS
25.0000 mg | ORAL_TABLET | Freq: Three times a day (TID) | ORAL | Status: DC
  Administered 2021-01-20 – 2021-01-26 (×16): 25 mg via ORAL
  Filled 2021-01-19 (×19): qty 1

## 2021-01-19 MED ORDER — FENTANYL CITRATE PF 50 MCG/ML IJ SOSY: 100.0000 ug | PREFILLED_SYRINGE | Freq: Once | INTRAMUSCULAR | Status: AC

## 2021-01-19 MED ORDER — SENNOSIDES-DOCUSATE SODIUM 8.6-50 MG PO TABS: 1.0000 | ORAL_TABLET | Freq: Every evening | ORAL | Status: DC | PRN

## 2021-01-19 MED ORDER — PROPOFOL 10 MG/ML IV BOLUS
40.0000 mg | Freq: Once | INTRAVENOUS | Status: DC
  Filled 2021-01-19: qty 20

## 2021-01-19 MED ORDER — AMLODIPINE BESYLATE 10 MG PO TABS
10.0000 mg | ORAL_TABLET | Freq: Every day | ORAL | Status: DC
  Administered 2021-01-20 – 2021-01-26 (×8): 10 mg via ORAL
  Filled 2021-01-19 (×2): qty 1
  Filled 2021-01-19: qty 2
  Filled 2021-01-19 (×3): qty 1
  Filled 2021-01-19: qty 2
  Filled 2021-01-19: qty 1

## 2021-01-19 MED ORDER — MORPHINE SULFATE (PF) 2 MG/ML IV SOLN
INTRAVENOUS | Status: AC
  Filled 2021-01-19: qty 2

## 2021-01-19 MED ORDER — FENTANYL CITRATE PF 50 MCG/ML IJ SOSY
PREFILLED_SYRINGE | INTRAMUSCULAR | Status: AC
  Administered 2021-01-19: 100 ug via INTRAVENOUS
  Filled 2021-01-19: qty 2

## 2021-01-19 MED ORDER — SODIUM CHLORIDE 0.9 % IV SOLN
INTRAVENOUS | Status: AC | PRN
  Administered 2021-01-19: 10 mL/h via INTRAVENOUS

## 2021-01-19 MED ORDER — ASPIRIN EC 81 MG PO TBEC
81.0000 mg | DELAYED_RELEASE_TABLET | ORAL | Status: DC
  Administered 2021-01-20 – 2021-01-26 (×6): 81 mg via ORAL
  Filled 2021-01-19 (×8): qty 1

## 2021-01-19 MED ORDER — LAMOTRIGINE 25 MG PO TABS
100.0000 mg | ORAL_TABLET | Freq: Two times a day (BID) | ORAL | Status: DC
  Administered 2021-01-20 – 2021-01-26 (×13): 100 mg via ORAL
  Filled 2021-01-19 (×5): qty 4
  Filled 2021-01-19: qty 1
  Filled 2021-01-19 (×8): qty 4

## 2021-01-19 MED ORDER — PROPOFOL 10 MG/ML IV BOLUS
80.0000 mg | Freq: Once | INTRAVENOUS | Status: DC
  Filled 2021-01-19: qty 20

## 2021-01-19 MED ORDER — MORPHINE SULFATE (PF) 2 MG/ML IV SOLN
0.5000 mg | INTRAVENOUS | Status: DC | PRN
  Administered 2021-01-19: 0.5 mg via INTRAVENOUS
  Filled 2021-01-19: qty 1

## 2021-01-19 MED ORDER — FENTANYL CITRATE PF 50 MCG/ML IJ SOSY
50.0000 ug | PREFILLED_SYRINGE | Freq: Once | INTRAMUSCULAR | Status: AC
  Administered 2021-01-19: 50 ug via INTRAVENOUS
  Filled 2021-01-19: qty 1

## 2021-01-19 MED ORDER — ZOLPIDEM TARTRATE 5 MG PO TABS
5.0000 mg | ORAL_TABLET | Freq: Every evening | ORAL | Status: DC | PRN
  Administered 2021-01-24: 5 mg via ORAL
  Filled 2021-01-19: qty 1

## 2021-01-19 NOTE — ED Provider Notes (Signed)
[facility]  ____________________________________________   Event Date/Time   First MD Initiated Contact with Patient 01/19/21 1726     (approximate)  I have reviewed the triage vital signs and the nursing notes.   HISTORY  Chief Complaint Ankle Injury    HPI Sandra Charles is a 65 y.o. female with past medical history of headache, GERD, chronic low back pain, anxiety depression, seizure disorder who presents after a fall with a right ankle injury.  Patient tells me she thinks she fell down 3 or 4 steps.  Does not really remember the event.  She is not sure if she hit her head.  Denies head or neck pain.  She has significant pain of the right ankle which is obviously deformed.  Denies numbness or tingling.  Denies chest pain abdominal pain hip pain etc.  She does not remember feeling lightheaded or having any chest pain or palpitations prior to the fall.  Denies drug or alcohol use prior.         Past Medical History:  Diagnosis Date   Anxiety    Arthritis    "all over"   Chronic lower back pain    Depression    GERD (gastroesophageal reflux disease)    Headache    "weekly" (07/15/2015)   Hyperlipidemia    Hypertension    Mini stroke    "several since 05/2014" (07/15/2015)   Seizures ([place]) dx'd 04/2015    Patient Active Problem List   Diagnosis Date Noted   Trimalleolar fracture of ankle, closed, right, initial encounter 01/19/2021   Accidental fall 01/19/2021   Preoperative clearance 01/19/2021   Seizures ([place]) 08/26/2020   Respiratory failure with hypoxia ([place])    Aspiration into airway    Acute pulmonary edema ([facility])    Tetrahydrocannabinol (THC) dependence ([place])    Sepsis ([place]) 09/22/2018   Altered mental status 03/27/2018   HLD (hyperlipidemia) 10/05/2017   Seizure ([place])    UTI (urinary tract infection) 08/17/2015   Seizure disorder ([place]) 08/10/2015   Cannabis use disorder, moderate, dependence ([place]) 08/10/2015   Schizoaffective  disorder ([place]) 08/10/2015   Abnormal finding on MRI of brain    Hypokalemia 07/15/2015   Confusion    GERD (gastroesophageal reflux disease) 11/25/2014   Essential hypertension 11/25/2014    Past Surgical History:  Procedure Laterality Date   DILATION AND CURETTAGE OF UTERUS     LUMBAR PUNCTURE  07/14/2015   TUBAL LIGATION     VAGINAL HYSTERECTOMY      Prior to Admission medications   Medication Sig Start Date End Date Taking? Authorizing Provider  amLODipine (NORVASC) 10 MG tablet Take 10 mg by mouth daily. For high blood pressure    [provider]  aspirin EC 81 MG tablet Take 81 mg by mouth every morning.     [provider]  benzonatate (TESSALON) 100 MG capsule Take 1 capsule (100 mg total) by mouth 3 (three) times daily. 08/31/20   Lavina Hamman, MD  Cholecalciferol (VITAMIN D3) 5000 UNITS TABS Take 5,000 Units by mouth every morning.     [provider]  cloBAZam (ONFI) 10 MG tablet Take 0.5 tablets (5 mg total) by mouth at bedtime. 01/13/21   Ward, Delice Bison, DO  cloNIDine (CATAPRES) 0.1 MG tablet Take 1 tablet (0.1 mg total) by mouth 2 (two) times daily. 07/15/15   Loletha Grayer, MD  donepezil (ARICEPT) 10 MG tablet Take 10 mg by mouth at bedtime.    [provider]  lamoTRIgine (LAMICTAL) 100 MG tablet 100 mg in morning and 125 mg in evening. 08/31/20   Lavina Hamman, MD  lamoTRIgine (LAMICTAL) 25 MG tablet Take 1 tablet (25 mg total) by mouth every evening. Along with 100 mg tablet for total of 125 mg in evening. 08/31/20   Lavina Hamman, MD  pantoprazole (PROTONIX) 40 MG tablet Take 1 tablet (40 mg total) by mouth daily. 11/25/14   Wellington Hampshire, MD  propranolol ER (INDERAL LA) 60 MG 24 hr capsule Take 60 mg by mouth daily. 09/15/17   [provider]  QUEtiapine (SEROQUEL XR) 50 MG TB24 24 hr tablet Take 50 mg by mouth at bedtime.    [provider]  sertraline (ZOLOFT) 100 MG tablet Take 100 mg by mouth daily. 08/30/17    [provider]  vitamin B-12 (CYANOCOBALAMIN) 1000 MCG tablet Take 1,000 mcg by mouth daily.    [provider]    Allergies Lovastatin  Family History  Problem Relation Age of Onset   Hyperlipidemia Mother    Breast cancer Mother 79   Hypertension Mother    Heart attack Mother        age 22's   Hyperlipidemia Father    Hypertension Father    Heart attack Father 18   Breast cancer Sister 83    Social History Social History   Tobacco Use   Smoking status: Former    Packs/day: 1.00    Years: 5.00    Pack years: 5.00    Types: Cigarettes   Smokeless tobacco: Never   Tobacco comments:    "quit smoking cigarettes in the early 2000s"  Substance Use Topics   Alcohol use: Yes    Alcohol/week: 0.0 standard drinks    Comment: 07/15/2015 "nothing in the last few years"   Drug use: No    Review of Systems   Review of Systems  Constitutional:  Negative for chills and fever.  Respiratory:  Negative for shortness of breath.   Cardiovascular:  Negative for chest pain.  Gastrointestinal:  Negative for abdominal pain.  Musculoskeletal:  Positive for arthralgias, gait problem, joint swelling and myalgias.  Neurological:  Negative for weakness, light-headedness, numbness and headaches.  All other systems reviewed and are negative.  Physical Exam Updated Vital Signs BP (!) 182/83   Pulse 74   Temp 98.3 F (36.8 C) (Oral)   Resp 17   Ht 5\' 2"  (1.575 m)   Wt 79.2 kg   LMP  (LMP Unknown)   SpO2 99%   BMI 31.94 kg/m   Physical Exam Vitals and nursing note reviewed.  Constitutional:      General: She is not in acute distress.    Appearance: Normal appearance.  HENT:     Head: Normocephalic and atraumatic.  Eyes:     General: No scleral icterus.    Conjunctiva/sclera: Conjunctivae normal.  Pulmonary:     Effort: Pulmonary effort is normal. No respiratory distress.     Breath sounds: No stridor.  Musculoskeletal:        General: No deformity or  signs of injury.     Cervical back: Normal range of motion.     Comments: Right ankle with obvious deformity and swelling, 2+ DP pulse, she is able to move her toes  Skin:    General: Skin is dry.     Coloration: Skin is not jaundiced or pale.  Neurological:     General: No focal deficit present.  Mental Status: She is alert and oriented to person, place, and time. Mental status is at baseline.  Psychiatric:        Mood and Affect: Mood normal.        Behavior: Behavior normal.     LABS (all labs ordered are listed, but only abnormal results are displayed)  Labs Reviewed  COMPREHENSIVE METABOLIC PANEL  CBC WITH DIFFERENTIAL/PLATELET   ____________________________________________  EKG   ____________________________________________  RADIOLOGY Almeta Monas, personally viewed and evaluated these images (plain radiographs) as part of my medical decision making, as well as reviewing the written report by the radiologist.  ED MD interpretation:    I reviewed the x-ray of the right ankle which shows a fracture dislocation, trimalleolar fracture    ____________________________________________   PROCEDURES  Procedure(s) performed (including Critical Care):  .Sedation  Date/Time: 01/19/2021 11:41 PM Performed by: Rada Hay, MD Authorized by: Rada Hay, MD   Consent:    Consent obtained:  Verbal and written Universal protocol:    Procedure explained and questions answered to patient or proxy's satisfaction: yes     Relevant documents present and verified: yes     Test results available: yes     Imaging studies available: yes     Required blood products, implants, devices, and special equipment available: yes     Site/side marked: yes     Immediately prior to procedure, a time out was called: yes     Patient identity confirmed:  Verbally with patient Indications:    Procedure performed:  Dislocation reduction Pre-sedation assessment:     Time since last food or drink:  Over 8 hours ago   ASA classification: class 2 - patient with mild systemic disease     Mouth opening:  3 or more finger widths   Thyromental distance:  4 finger widths   Mallampati score:  II - soft palate, uvula, fauces visible   Neck mobility: normal     Pre-sedation assessments completed and reviewed: airway patency, cardiovascular function, mental status and respiratory function   Immediate pre-procedure details:    Reassessment: Patient reassessed immediately prior to procedure     Reviewed: vital signs and NPO status     Verified: bag valve mask available, emergency equipment available, intubation equipment available, IV patency confirmed, oxygen available and suction available   Procedure details (see MAR for exact dosages):    Preoxygenation:  Nasal cannula   Intended level of sedation: deep   Analgesia:  Fentanyl   Intra-procedure monitoring:  Blood pressure monitoring, cardiac monitor, continuous capnometry, continuous pulse oximetry and frequent LOC assessments   Intra-procedure events: none     Total Provider sedation time (minutes):  10 Post-procedure details:    Post-sedation assessments completed and reviewed: airway patency, cardiovascular function, mental status, pain level and respiratory function     Patient is stable for discharge or admission: no     Procedure completion:  Tolerated well, no immediate complications .Ortho Injury Treatment  Date/Time: 01/19/2021 11:44 PM Performed by: Rada Hay, MD Authorized by: Rada Hay, MD   Consent:    Consent obtained:  Verbal and written   Consent given by:  PatientInjury location: ankle Location details: right ankle Injury type: fracture-dislocation Pre-procedure neurovascular assessment: neurovascularly intact Pre-procedure distal perfusion: normal Pre-procedure neurological function: normal Pre-procedure range of motion: reduced  Anesthesia: Local anesthesia used:  no  Patient sedated: Yes. Refer to sedation procedure documentation for details of sedation. Manipulation performed: yes Skin  traction used: no Skeletal traction used: no Reduction successful: yes X-ray confirmed reduction: yes Immobilization: splint Splint type: short leg and ankle stirrup Splint Applied by: ED Provider Supplies used: Ortho-Glass Post-procedure neurovascular assessment: post-procedure neurovascularly intact Post-procedure distal perfusion: normal Post-procedure neurological function: normal Post-procedure range of motion: improved     ____________________________________________   INITIAL IMPRESSION / ASSESSMENT AND PLAN / ED COURSE  Patient is a 64 year old female who presents after a fall.  Somewhat unclear if this was mechanical or syncopal episode as patient is a poor historian.  She has an obvious closed deformity of the right ankle, with intact pulses.  No other signs of trauma on exam.  Given patient somewhat unclear if she hit her head did obtain a CT head and C-spine which are negative for injury.  Discussed with Dr. Roland Rack who recommends close reduction of the dislocation and admission for surgery tomorrow.  Procedural sedation was performed with propofol and adequate reduction obtained.  Patient put in a splint.  Will admit to the hospitalist service. ____________________________________________   FINAL CLINICAL IMPRESSION(S) / ED DIAGNOSES  Final diagnoses:  Status post open reduction with internal fixation (ORIF) of fracture of ankle  Closed trimalleolar fracture of right ankle, initial encounter     ED Discharge Orders     None        Note:  This document was prepared using Dragon voice recognition software and may include unintentional dictation errors.    Rada Hay, MD 01/19/21 [id number]

## 2021-01-19 NOTE — ED Notes (Signed)
Pharmacy messaged for patient's meds to be verified

## 2021-01-19 NOTE — ED Notes (Addendum)
MD resetting and recasting right ankle per Ortho MD pt tolerated well.

## 2021-01-19 NOTE — ED Notes (Signed)
Report received from [place], [place]

## 2021-01-19 NOTE — ED Triage Notes (Signed)
Pt here via [facility] with a right ankle injury today. Pt was outside and missed a step and fell. 166mcg of fentanyl given by ems.   217/85 49-co2 16 78 20G L hand

## 2021-01-19 NOTE — H&P (Signed)
History and Physical    MIAKODA MCMILLION HGD:[id number] DOB: April 30, 1955 DOA: 01/19/2021  PCP: Ashley Jacobs, MD   Patient coming from: home  I have personally briefly reviewed patient's relevant medical records in [place]  Chief Complaint: fall, right ankle injury  HPI: Sandra Charles is a 65 y.o. female with medical history significant for Seizure disorder with recurrent breakthrough seizures, hospitalized in July/22, most recently seen in the ED on 11/18 for same, as well as history of anxiety, chronic cannabis use, depression, headaches, TIA, who presents with a mechanical fall after she missed a step falling down 3-4 stairs injuring her right ankle.  The fall was not preceded by a seizure, lightheadedness or dizziness, visual disturbance or one-sided weakness, numbness or tingling, no chest pain or shortness of breath or palpitations.  She did not hit her head and did not have loss of consciousness.  She denies prior alcohol or substance abuse  ED course: BP 184/74 with otherwise unremarkable vitals Blood work pending at time of request for admission  EKG pending Imaging: CT head and C-spine with no acute injury Ankle x-ray showing acute right trimalleolar ankle fracture with posterior dislocation - Patient underwent reduction in the ED with cast placement  The ED provider spoke with orthopedist, Dr. Roland Rack who will take patient to the OR in the a.m.  Hospitalist consulted for admission.    Review of Systems: As per HPI otherwise all other systems on review of systems negative.    Past Medical History:  Diagnosis Date   Anxiety    Arthritis    "all over"   Chronic lower back pain    Depression    GERD (gastroesophageal reflux disease)    Headache    "weekly" (07/15/2015)   Hyperlipidemia    Hypertension    Mini stroke    "several since 05/2014" (07/15/2015)   Seizures ([place]) dx'd 04/2015    Past Surgical History:  Procedure Laterality Date   DILATION AND CURETTAGE OF  UTERUS     LUMBAR PUNCTURE  07/14/2015   TUBAL LIGATION     VAGINAL HYSTERECTOMY       reports that she has quit smoking. Her smoking use included cigarettes. She has a 5.00 pack-year smoking history. She has never used smokeless tobacco. She reports current alcohol use. She reports that she does not use drugs.  Allergies  Allergen Reactions   Lovastatin Other (See Comments)    Unknown reaction    Family History  Problem Relation Age of Onset   Hyperlipidemia Mother    Breast cancer Mother 3   Hypertension Mother    Heart attack Mother        age 57's   Hyperlipidemia Father    Hypertension Father    Heart attack Father 58   Breast cancer Sister 26      Prior to Admission medications   Medication Sig Start Date End Date Taking? Authorizing Provider  amLODipine (NORVASC) 10 MG tablet Take 10 mg by mouth daily. For high blood pressure    [provider]  aspirin EC 81 MG tablet Take 81 mg by mouth every morning.     [provider]  benzonatate (TESSALON) 100 MG capsule Take 1 capsule (100 mg total) by mouth 3 (three) times daily. 08/31/20   Lavina Hamman, MD  Cholecalciferol (VITAMIN D3) 5000 UNITS TABS Take 5,000 Units by mouth every morning.     [provider]  cloBAZam (ONFI) 10 MG tablet  Take 0.5 tablets (5 mg total) by mouth at bedtime. 01/13/21   Ward, Delice Bison, DO  cloNIDine (CATAPRES) 0.1 MG tablet Take 1 tablet (0.1 mg total) by mouth 2 (two) times daily. 07/15/15   Loletha Grayer, MD  donepezil (ARICEPT) 10 MG tablet Take 10 mg by mouth at bedtime.    [provider]  lamoTRIgine (LAMICTAL) 100 MG tablet 100 mg in morning and 125 mg in evening. 08/31/20   Lavina Hamman, MD  lamoTRIgine (LAMICTAL) 25 MG tablet Take 1 tablet (25 mg total) by mouth every evening. Along with 100 mg tablet for total of 125 mg in evening. 08/31/20   Lavina Hamman, MD  pantoprazole (PROTONIX) 40 MG tablet Take 1 tablet (40 mg total) by mouth daily.  11/25/14   Wellington Hampshire, MD  propranolol ER (INDERAL LA) 60 MG 24 hr capsule Take 60 mg by mouth daily. 09/15/17   [provider]  QUEtiapine (SEROQUEL XR) 50 MG TB24 24 hr tablet Take 50 mg by mouth at bedtime.    [provider]  sertraline (ZOLOFT) 100 MG tablet Take 100 mg by mouth daily. 08/30/17   [provider]  vitamin B-12 (CYANOCOBALAMIN) 1000 MCG tablet Take 1,000 mcg by mouth daily.    [provider]    Physical Exam: Vitals:   01/19/21 2045 01/19/21 2051 01/19/21 2100 01/19/21 2115  BP: (!) 191/68 (!) 177/68 (!) 178/69 (!) 194/80  Pulse: 77 71 70 76  Resp: 20 17 19 11   Temp:      TempSrc:      SpO2: 100% 100% 100% 100%  Weight:      Height:       Constitutional: Alert and oriented x 3 . in pain distress HEENT:      Head: Normocephalic and atraumatic.         Eyes: PERLA, EOMI, Conjunctivae are normal. Sclera is non-icteric.       Mouth/Throat: Mucous membranes are moist.       Neck: Supple with no signs of meningismus. Cardiovascular: Regular rate and rhythm. No murmurs, gallops, or rubs. 2+ symmetrical distal pulses are present . No JVD. No  LE edema Respiratory: Respiratory effort normal .Lungs sounds clear bilaterally. No wheezes, crackles, or rhonchi.  Gastrointestinal: Soft, non tender, non distended. Positive bowel sounds.  Genitourinary: No CVA tenderness. Musculoskeletal: right ankle in cast. Neurologic:  Face is symmetric. Moving all extremities. No gross focal neurologic deficits . Skin: Skin is warm, dry.  No rash or ulcers Psychiatric: Mood and affect are appropriate    Labs on Admission: I have personally reviewed following labs and imaging studies  CBC: Recent Labs  Lab 01/13/21 0400  WBC 7.4  NEUTROABS 4.8  HGB 11.5*  HCT 39.1  MCV 73.1*  PLT 527   Basic Metabolic Panel: Recent Labs  Lab 01/13/21 0400  NA 138  K 3.5  CL 103  CO2 25  GLUCOSE 120*  BUN 14  CREATININE 0.86  CALCIUM 8.9    GFR: Estimated Creatinine Clearance: 65.2 mL/min (by C-G formula based on SCr of 0.86 mg/dL). Liver Function Tests: Recent Labs  Lab 01/13/21 0400  AST 45*  ALT 63*  ALKPHOS 109  BILITOT 0.6  PROT 7.9  ALBUMIN 4.0   No results for input(s): LIPASE, AMYLASE in the last 168 hours. No results for input(s): AMMONIA in the last 168 hours. Coagulation Profile: No results for input(s): INR, PROTIME in the last 168 hours. Cardiac Enzymes: No results  for input(s): CKTOTAL, CKMB, CKMBINDEX, TROPONINI in the last 168 hours. BNP (last 3 results) No results for input(s): PROBNP in the last 8760 hours. HbA1C: No results for input(s): HGBA1C in the last 72 hours. CBG: No results for input(s): GLUCAP in the last 168 hours. Lipid Profile: No results for input(s): CHOL, HDL, LDLCALC, TRIG, CHOLHDL, LDLDIRECT in the last 72 hours. Thyroid Function Tests: No results for input(s): TSH, T4TOTAL, FREET4, T3FREE, THYROIDAB in the last 72 hours. Anemia Panel: No results for input(s): VITAMINB12, FOLATE, FERRITIN, TIBC, IRON, RETICCTPCT in the last 72 hours. Urine analysis:    Component Value Date/Time   COLORURINE YELLOW (A) 01/13/2021 0517   APPEARANCEUR HAZY (A) 01/13/2021 0517   APPEARANCEUR Hazy 06/10/2014 0021   LABSPEC 1.024 01/13/2021 0517   LABSPEC 1.005 06/10/2014 0021   PHURINE 5.0 01/13/2021 0517   GLUCOSEU NEGATIVE 01/13/2021 0517   GLUCOSEU Negative 06/10/2014 0021   HGBUR SMALL (A) 01/13/2021 0517   BILIRUBINUR NEGATIVE 01/13/2021 0517   BILIRUBINUR Negative 06/10/2014 0021   KETONESUR NEGATIVE 01/13/2021 0517   PROTEINUR 30 (A) 01/13/2021 0517   NITRITE NEGATIVE 01/13/2021 0517   LEUKOCYTESUR NEGATIVE 01/13/2021 0517   LEUKOCYTESUR 2+ 06/10/2014 0021    Radiological Exams on Admission: DG Knee 2 Views Right  Result Date: 01/19/2021 CLINICAL DATA:  Rule out fracture.  Injury.  Fall.  Pain. EXAM: RIGHT KNEE - 1-2 VIEW COMPARISON:  None. FINDINGS: Enthesopathic changes  at the superior patella. No effusion. No fracture identified. IMPRESSION: Negative. Electronically Signed   By: Dorise Bullion III M.D.   On: 01/19/2021 17:15   DG Ankle 2 Views Right  Result Date: 01/19/2021 CLINICAL DATA:  Right ankle pain after fall EXAM: RIGHT FOOT COMPLETE - 3+ VIEW; RIGHT ANKLE - 2 VIEW COMPARISON:  None. FINDINGS: Acute trimalleolar right ankle fracture with posterior dislocation of the talus relative to the tibia. Fracture evaluation is limited by nonstandard projections. Best possible images were obtained. No fractures identified within the right foot. Subtalar alignment is maintained. Diffuse soft tissue swelling at the fracture site. IMPRESSION: 1. Acute trimalleolar right ankle fracture with posterior dislocation of the talus relative to the tibia. 2. No fractures identified within the right foot. Electronically Signed   By: Davina Poke D.O.   On: 01/19/2021 17:18   CT HEAD WO CONTRAST (5MM)  Result Date: 01/19/2021 CLINICAL DATA:  Abnormal mental status. Head trauma as missed step and fell. EXAM: CT HEAD WITHOUT CONTRAST CT CERVICAL SPINE WITHOUT CONTRAST TECHNIQUE: Multidetector CT imaging of the head and cervical spine was performed following the standard protocol without intravenous contrast. Multiplanar CT image reconstructions of the cervical spine were also generated. COMPARISON:  Head CT 08/25/2020 FINDINGS: CT HEAD FINDINGS Brain: Ventricles, cisterns and other CSF spaces are normal. There is minimal chronic ischemic microvascular disease. There is no mass, mass effect, shift of midline structures or acute hemorrhage. No evidence of acute infarction. Vascular: No hyperdense vessel or unexpected calcification. Skull: Normal. Negative for fracture or focal lesion. Sinuses/Orbits: No acute finding. Other: None. CT CERVICAL SPINE FINDINGS Alignment: Mild reversal of the normal cervical lordosis. No evidence of subluxation. Skull base and vertebrae: Mild to moderate  spondylosis of the cervical spine to include uncovertebral joint spurring and facet arthropathy. Vertebral body heights are normal. Mild right-sided neural foraminal narrowing at the C3-4 level. Minimal bilateral neural foraminal at the C6-7 level and mild left-sided neural from narrowing at the C5-6 level. No acute fracture. Soft tissues and spinal canal: Normal. Disc  levels:  Disc space narrowing at the C6-7 level. Upper chest: No acute findings. Other: None. IMPRESSION: 1. No acute brain injury. 2. Minimal chronic ischemic microvascular disease. 3. No acute cervical spine injury. 4. Mild to moderate spondylosis of the cervical spine with disc disease at the C6-7 level. Mild multilevel neural foraminal narrowing as described. Electronically Signed   By: Marin Olp M.D.   On: 01/19/2021 19:10   CT Cervical Spine Wo Contrast  Result Date: 01/19/2021 CLINICAL DATA:  Abnormal mental status. Head trauma as missed step and fell. EXAM: CT HEAD WITHOUT CONTRAST CT CERVICAL SPINE WITHOUT CONTRAST TECHNIQUE: Multidetector CT imaging of the head and cervical spine was performed following the standard protocol without intravenous contrast. Multiplanar CT image reconstructions of the cervical spine were also generated. COMPARISON:  Head CT 08/25/2020 FINDINGS: CT HEAD FINDINGS Brain: Ventricles, cisterns and other CSF spaces are normal. There is minimal chronic ischemic microvascular disease. There is no mass, mass effect, shift of midline structures or acute hemorrhage. No evidence of acute infarction. Vascular: No hyperdense vessel or unexpected calcification. Skull: Normal. Negative for fracture or focal lesion. Sinuses/Orbits: No acute finding. Other: None. CT CERVICAL SPINE FINDINGS Alignment: Mild reversal of the normal cervical lordosis. No evidence of subluxation. Skull base and vertebrae: Mild to moderate spondylosis of the cervical spine to include uncovertebral joint spurring and facet arthropathy. Vertebral  body heights are normal. Mild right-sided neural foraminal narrowing at the C3-4 level. Minimal bilateral neural foraminal at the C6-7 level and mild left-sided neural from narrowing at the C5-6 level. No acute fracture. Soft tissues and spinal canal: Normal. Disc levels:  Disc space narrowing at the C6-7 level. Upper chest: No acute findings. Other: None. IMPRESSION: 1. No acute brain injury. 2. Minimal chronic ischemic microvascular disease. 3. No acute cervical spine injury. 4. Mild to moderate spondylosis of the cervical spine with disc disease at the C6-7 level. Mild multilevel neural foraminal narrowing as described. Electronically Signed   By: Marin Olp M.D.   On: 01/19/2021 19:10   DG Ankle Right Port  Result Date: 01/19/2021 CLINICAL DATA:  Postreduction of the right ankle. EXAM: PORTABLE RIGHT ANKLE - 2 VIEW COMPARISON:  Knee radiograph dated 01/19/2021. FINDINGS: Displaced fractures of the distal fibula and medial malleolus with lateral angulation and displacement of the distal fracture fragment and lateral subluxation of the ankle mortise. There is a displaced fracture of the posterior malleolus. There has been interval decrease in the degree of the dorsal dislocation of the ankle compared to prior radiograph. A partial cast is noted. IMPRESSION: Displaced trimalleolar fracture status post cast placement. Electronically Signed   By: Anner Crete M.D.   On: 01/19/2021 20:52   DG Foot Complete Right  Result Date: 01/19/2021 CLINICAL DATA:  Right ankle pain after fall EXAM: RIGHT FOOT COMPLETE - 3+ VIEW; RIGHT ANKLE - 2 VIEW COMPARISON:  None. FINDINGS: Acute trimalleolar right ankle fracture with posterior dislocation of the talus relative to the tibia. Fracture evaluation is limited by nonstandard projections. Best possible images were obtained. No fractures identified within the right foot. Subtalar alignment is maintained. Diffuse soft tissue swelling at the fracture site. IMPRESSION:  1. Acute trimalleolar right ankle fracture with posterior dislocation of the talus relative to the tibia. 2. No fractures identified within the right foot. Electronically Signed   By: Davina Poke D.O.   On: 01/19/2021 17:18    Assessment/Plan    Trimalleolar fracture of right ankle with posterior dislocation encounter  Preoperative clearance   Accidental fall -S/p reduction in the ED - Patient with low risk for perioperative cardiopulmonary complications (pending unremarkable EKG and blood work) - Has history of intubation in the past in the setting of aspiration pneumonia related to seizure - N.p.o. after midnight - SCDs for DVT prophylaxis - Orthopedic consult for the OR in the a.m. - Pain control - Further orders per Ortho    Essential hypertension - Continue amlodipine and propranolol, with as needed IV meds for BP control    Seizure disorder ([facility]) - Continue Lamictal and clobazam    Schizoaffective disorder ([place]) - Continue quetiapine and sertraline  History of TIA - Continue aspirin    DVT prophylaxis: SCDs code Status: full code  Family Communication:  none  Disposition Plan: Back to previous home environment Consults called: Orthopedics  status: Observation   Athena Masse MD [facility]   01/19/2021, 9:18 PM

## 2021-01-19 NOTE — Sedation Documentation (Signed)
Radiology at the bedside to perform post-reduction right ankle x-ray

## 2021-01-19 NOTE — ED Notes (Signed)
Radiology back at the bedside for a second post-reduction x-ray of the right ankle.

## 2021-01-19 NOTE — Sedation Documentation (Signed)
Right ankle casted and immobilized by Acquanetta Belling, MD

## 2021-01-20 ENCOUNTER — Observation Stay: Payer: Medicare HMO | Admitting: Anesthesiology

## 2021-01-20 ENCOUNTER — Encounter
Admission: EM | Disposition: A | Discharge status: Skilled Nursing Facility | Payer: Self-pay | Source: Home / Self Care | Attending: Emergency Medicine

## 2021-01-20 ENCOUNTER — Observation Stay: Payer: Medicare HMO

## 2021-01-20 ENCOUNTER — Encounter: Payer: Self-pay | Admitting: Internal Medicine

## 2021-01-20 DIAGNOSIS — S82851A Displaced trimalleolar fracture of right lower leg, initial encounter for closed fracture: Secondary | ICD-10-CM | POA: Diagnosis not present

## 2021-01-20 HISTORY — PX: ORIF ANKLE FRACTURE: SHX5408

## 2021-01-20 LAB — COMPREHENSIVE METABOLIC PANEL
ALT: 51 U/L — ABNORMAL HIGH (ref 0–44)
AST: 38 U/L (ref 15–41)
Albumin: 4 g/dL (ref 3.5–5.0)
Alkaline Phosphatase: 105 U/L (ref 38–126)
Anion gap: 9 (ref 5–15)
BUN: 11 mg/dL (ref 8–23)
CO2: 27 mmol/L (ref 22–32)
Calcium: 9.2 mg/dL (ref 8.9–10.3)
Chloride: 102 mmol/L (ref 98–111)
Creatinine, Ser: 0.74 mg/dL (ref 0.44–1.00)
GFR, Estimated: >60 mL/min (ref >60)
Glucose, Bld: 123 mg/dL — ABNORMAL HIGH (ref 70–99)
Potassium: 3.5 mmol/L (ref 3.5–5.1)
Sodium: 138 mmol/L (ref 135–145)
Total Bilirubin: 0.7 mg/dL (ref 0.3–1.2)
Total Protein: 7.9 g/dL (ref 6.5–8.1)

## 2021-01-20 LAB — CBC WITH DIFFERENTIAL/PLATELET
Abs Immature Granulocytes: 0.04 10*3/uL (ref 0.00–0.07)
Basophils Absolute: 0 10*3/uL (ref 0.0–0.1)
Basophils Relative: 0 %
Eosinophils Absolute: 0 10*3/uL (ref 0.0–0.5)
Eosinophils Relative: 0 %
HCT: 37.4 % (ref 36.0–46.0)
Hemoglobin: 11.2 g/dL — ABNORMAL LOW (ref 12.0–15.0)
Immature Granulocytes: 0 %
Lymphocytes Relative: 14 %
Lymphs Abs: 1.3 10*3/uL (ref 0.7–4.0)
MCH: 22 pg — ABNORMAL LOW (ref 26.0–34.0)
MCHC: 29.9 g/dL — ABNORMAL LOW (ref 30.0–36.0)
MCV: 73.5 fL — ABNORMAL LOW (ref 80.0–100.0)
Monocytes Absolute: 0.8 10*3/uL (ref 0.1–1.0)
Monocytes Relative: 9 %
Neutro Abs: 6.8 10*3/uL (ref 1.7–7.7)
Neutrophils Relative %: 77 %
Platelets: 241 10*3/uL (ref 150–400)
RBC: 5.09 MIL/uL (ref 3.87–5.11)
RDW: 21.9 % — ABNORMAL HIGH (ref 11.5–15.5)
Smear Review: NORMAL
WBC: 8.9 10*3/uL (ref 4.0–10.5)
nRBC: 0 % (ref 0.0–0.2)

## 2021-01-20 LAB — RESP PANEL BY RT-PCR (FLU A&B, COVID) ARPGX2
Influenza A by PCR: NEGATIVE
Influenza B by PCR: NEGATIVE
SARS Coronavirus 2 by RT PCR: NEGATIVE

## 2021-01-20 SURGERY — OPEN REDUCTION INTERNAL FIXATION (ORIF) ANKLE FRACTURE
Anesthesia: General | Site: Ankle | Laterality: Right

## 2021-01-20 MED ORDER — LIDOCAINE HCL (PF) 1 % IJ SOLN
INTRAMUSCULAR | Status: DC | PRN
  Administered 2021-01-20: 1 mL via SUBCUTANEOUS

## 2021-01-20 MED ORDER — MIDAZOLAM HCL 2 MG/2ML IJ SOLN
INTRAMUSCULAR | Status: AC
  Filled 2021-01-20: qty 2

## 2021-01-20 MED ORDER — SODIUM CHLORIDE 0.9 % IV SOLN
INTRAVENOUS | Status: DC | PRN
  Administered 2021-01-20: 20 mL

## 2021-01-20 MED ORDER — DIPHENHYDRAMINE HCL 12.5 MG/5ML PO ELIX: 12.5000 mg | ORAL_SOLUTION | ORAL | Status: DC | PRN

## 2021-01-20 MED ORDER — ACETAMINOPHEN 325 MG PO TABS
325.0000 mg | ORAL_TABLET | Freq: Four times a day (QID) | ORAL | Status: DC | PRN
  Administered 2021-01-23: 14:00:00 650 mg via ORAL
  Filled 2021-01-20: qty 2

## 2021-01-20 MED ORDER — CLONIDINE HCL 0.1 MG PO TABS
0.1000 mg | ORAL_TABLET | Freq: Two times a day (BID) | ORAL | Status: DC
  Administered 2021-01-20 – 2021-01-26 (×12): 0.1 mg via ORAL
  Filled 2021-01-20 (×12): qty 1

## 2021-01-20 MED ORDER — DOCUSATE SODIUM 100 MG PO CAPS
100.0000 mg | ORAL_CAPSULE | Freq: Two times a day (BID) | ORAL | Status: DC
  Administered 2021-01-20 – 2021-01-26 (×12): 100 mg via ORAL
  Filled 2021-01-20 (×12): qty 1

## 2021-01-20 MED ORDER — METOCLOPRAMIDE HCL 10 MG PO TABS: 5.0000 mg | ORAL_TABLET | Freq: Three times a day (TID) | ORAL | Status: DC | PRN

## 2021-01-20 MED ORDER — ENSURE ENLIVE PO LIQD
237.0000 mL | Freq: Two times a day (BID) | ORAL | Status: DC
  Administered 2021-01-21 – 2021-01-23 (×3): 237 mL via ORAL

## 2021-01-20 MED ORDER — CEFAZOLIN SODIUM-DEXTROSE 2-4 GM/100ML-% IV SOLN
INTRAVENOUS | Status: AC
  Filled 2021-01-20: qty 100

## 2021-01-20 MED ORDER — HYDRALAZINE HCL 20 MG/ML IJ SOLN
INTRAMUSCULAR | Status: AC
  Filled 2021-01-20: qty 1

## 2021-01-20 MED ORDER — BISACODYL 10 MG RE SUPP: 10.0000 mg | Freq: Every day | RECTAL | Status: DC | PRN

## 2021-01-20 MED ORDER — PROPOFOL 1000 MG/100ML IV EMUL
INTRAVENOUS | Status: AC
  Filled 2021-01-20: qty 100

## 2021-01-20 MED ORDER — BUPIVACAINE HCL (PF) 0.5 % IJ SOLN
INTRAMUSCULAR | Status: AC
  Filled 2021-01-20: qty 30

## 2021-01-20 MED ORDER — PROMETHAZINE HCL 25 MG/ML IJ SOLN: 6.2500 mg | INTRAMUSCULAR | Status: DC | PRN

## 2021-01-20 MED ORDER — HYDROMORPHONE HCL 1 MG/ML IJ SOLN
1.0000 mg | INTRAMUSCULAR | Status: DC | PRN
  Administered 2021-01-20 (×3): 1 mg via INTRAVENOUS
  Filled 2021-01-20 (×3): qty 1

## 2021-01-20 MED ORDER — OXYCODONE HCL 5 MG/5ML PO SOLN: 5.0000 mg | Freq: Once | ORAL | Status: DC | PRN

## 2021-01-20 MED ORDER — 0.9 % SODIUM CHLORIDE (POUR BTL) OPTIME
TOPICAL | Status: DC | PRN
  Administered 2021-01-20: 500 mL

## 2021-01-20 MED ORDER — VITAMIN B-12 1000 MCG PO TABS
1000.0000 ug | ORAL_TABLET | Freq: Every day | ORAL | Status: DC
  Administered 2021-01-20 – 2021-01-26 (×7): 1000 ug via ORAL
  Filled 2021-01-20 (×7): qty 1

## 2021-01-20 MED ORDER — SODIUM CHLORIDE FLUSH 0.9 % IV SOLN
INTRAVENOUS | Status: AC
  Filled 2021-01-20: qty 10

## 2021-01-20 MED ORDER — ONDANSETRON HCL 4 MG/2ML IJ SOLN: 4.0000 mg | Freq: Four times a day (QID) | INTRAMUSCULAR | Status: DC | PRN

## 2021-01-20 MED ORDER — ACETAMINOPHEN 10 MG/ML IV SOLN: 1000.0000 mg | Freq: Once | INTRAVENOUS | Status: DC | PRN

## 2021-01-20 MED ORDER — HYDRALAZINE HCL 20 MG/ML IJ SOLN: 5.0000 mg | Freq: Once | INTRAMUSCULAR | Status: DC | PRN

## 2021-01-20 MED ORDER — FENTANYL CITRATE (PF) 100 MCG/2ML IJ SOLN: 25.0000 ug | INTRAMUSCULAR | Status: DC | PRN

## 2021-01-20 MED ORDER — BUPIVACAINE LIPOSOME 1.3 % IJ SUSP
INTRAMUSCULAR | Status: AC
  Filled 2021-01-20: qty 10

## 2021-01-20 MED ORDER — CEFAZOLIN SODIUM-DEXTROSE 2-4 GM/100ML-% IV SOLN
2.0000 g | Freq: Four times a day (QID) | INTRAVENOUS | Status: AC
  Administered 2021-01-20 – 2021-01-21 (×3): 2 g via INTRAVENOUS
  Filled 2021-01-20 (×3): qty 100

## 2021-01-20 MED ORDER — DONEPEZIL HCL 5 MG PO TABS
10.0000 mg | ORAL_TABLET | Freq: Every day | ORAL | Status: DC
  Administered 2021-01-20 – 2021-01-25 (×6): 10 mg via ORAL
  Filled 2021-01-20 (×6): qty 2

## 2021-01-20 MED ORDER — KETOROLAC TROMETHAMINE 15 MG/ML IJ SOLN
7.5000 mg | Freq: Four times a day (QID) | INTRAMUSCULAR | Status: AC
  Administered 2021-01-21 (×3): 7.5 mg via INTRAVENOUS
  Filled 2021-01-20 (×3): qty 1

## 2021-01-20 MED ORDER — ONDANSETRON HCL 4 MG PO TABS
4.0000 mg | ORAL_TABLET | Freq: Four times a day (QID) | ORAL | Status: DC | PRN
  Administered 2021-01-26: 4 mg via ORAL
  Filled 2021-01-20: qty 1

## 2021-01-20 MED ORDER — ENOXAPARIN SODIUM 40 MG/0.4ML IJ SOSY
40.0000 mg | PREFILLED_SYRINGE | INTRAMUSCULAR | Status: DC
  Administered 2021-01-21 – 2021-01-26 (×6): 40 mg via SUBCUTANEOUS
  Filled 2021-01-20 (×6): qty 0.4

## 2021-01-20 MED ORDER — FLEET ENEMA 7-19 GM/118ML RE ENEM: 1.0000 | ENEMA | Freq: Once | RECTAL | Status: DC | PRN

## 2021-01-20 MED ORDER — PANTOPRAZOLE SODIUM 40 MG PO TBEC
40.0000 mg | DELAYED_RELEASE_TABLET | Freq: Every day | ORAL | Status: DC
  Administered 2021-01-20 – 2021-01-26 (×7): 40 mg via ORAL
  Filled 2021-01-20 (×7): qty 1

## 2021-01-20 MED ORDER — BUPIVACAINE HCL (PF) 0.5 % IJ SOLN
INTRAMUSCULAR | Status: DC | PRN
  Administered 2021-01-20: 10 mL via PERINEURAL
  Administered 2021-01-20: 20 mL via PERINEURAL

## 2021-01-20 MED ORDER — MAGNESIUM HYDROXIDE 400 MG/5ML PO SUSP: 30.0000 mL | Freq: Every day | ORAL | Status: DC | PRN

## 2021-01-20 MED ORDER — ADULT MULTIVITAMIN W/MINERALS CH
1.0000 | ORAL_TABLET | Freq: Every day | ORAL | Status: DC
  Administered 2021-01-21 – 2021-01-26 (×6): 1 via ORAL
  Filled 2021-01-20 (×6): qty 1

## 2021-01-20 MED ORDER — MIDAZOLAM HCL 2 MG/2ML IJ SOLN: 1.0000 mg | Freq: Once | INTRAMUSCULAR | Status: DC

## 2021-01-20 MED ORDER — LACTATED RINGERS IV SOLN: INTRAVENOUS | Status: DC | PRN

## 2021-01-20 MED ORDER — VITAMIN D 25 MCG (1000 UNIT) PO TABS
5000.0000 [IU] | ORAL_TABLET | ORAL | Status: DC
  Administered 2021-01-21 – 2021-01-26 (×5): 5000 [IU] via ORAL
  Filled 2021-01-20 (×6): qty 5

## 2021-01-20 MED ORDER — SERTRALINE HCL 50 MG PO TABS
100.0000 mg | ORAL_TABLET | Freq: Every day | ORAL | Status: DC
  Administered 2021-01-20 – 2021-01-26 (×7): 100 mg via ORAL
  Filled 2021-01-20 (×7): qty 2

## 2021-01-20 MED ORDER — LIDOCAINE HCL (PF) 1 % IJ SOLN
INTRAMUSCULAR | Status: AC
  Filled 2021-01-20: qty 5

## 2021-01-20 MED ORDER — OXYCODONE HCL 5 MG PO TABS: 5.0000 mg | ORAL_TABLET | Freq: Once | ORAL | Status: DC | PRN

## 2021-01-20 MED ORDER — METOCLOPRAMIDE HCL 5 MG/ML IJ SOLN: 5.0000 mg | Freq: Three times a day (TID) | INTRAMUSCULAR | Status: DC | PRN

## 2021-01-20 MED ORDER — PROPOFOL 500 MG/50ML IV EMUL
INTRAVENOUS | Status: DC | PRN
  Administered 2021-01-20: 75 ug/kg/min via INTRAVENOUS

## 2021-01-20 MED ORDER — PROPOFOL 10 MG/ML IV BOLUS
INTRAVENOUS | Status: AC
  Filled 2021-01-20: qty 20

## 2021-01-20 MED ORDER — ACETAMINOPHEN 500 MG PO TABS
500.0000 mg | ORAL_TABLET | Freq: Four times a day (QID) | ORAL | Status: AC
  Administered 2021-01-20 – 2021-01-21 (×4): 500 mg via ORAL
  Filled 2021-01-20 (×4): qty 1

## 2021-01-20 MED ORDER — HYDROMORPHONE HCL 1 MG/ML IJ SOLN
0.2500 mg | INTRAMUSCULAR | Status: DC | PRN
Start: 2021-01-20 — End: 2021-01-26
  Administered 2021-01-23: 21:00:00 0.5 mg via INTRAVENOUS
  Filled 2021-01-20: qty 1

## 2021-01-20 MED ORDER — QUETIAPINE FUMARATE ER 50 MG PO TB24
50.0000 mg | ORAL_TABLET | Freq: Every day | ORAL | Status: DC
  Administered 2021-01-20 – 2021-01-25 (×6): 50 mg via ORAL
  Filled 2021-01-20 (×7): qty 1

## 2021-01-20 MED ORDER — FENTANYL CITRATE (PF) 100 MCG/2ML IJ SOLN
INTRAMUSCULAR | Status: AC
  Filled 2021-01-20: qty 2

## 2021-01-20 MED ORDER — MIDAZOLAM HCL 2 MG/2ML IJ SOLN
INTRAMUSCULAR | Status: AC
  Administered 2021-01-20: 1 mg
  Filled 2021-01-20: qty 2

## 2021-01-20 SURGICAL SUPPLY — 66 items
BIT DRILL 2.5X2.75 QC CALB (BIT) ×2 IMPLANT
BIT DRILL 2.9 CANN QC NONSTRL (BIT) ×2 IMPLANT
BIT DRILL 3.5X5.5 QC CALB (BIT) ×2 IMPLANT
BIT DRILL CALIBRATED 2.7 (BIT) ×2 IMPLANT
BLADE SURG SZ10 CARB STEEL (BLADE) ×4 IMPLANT
BNDG COHESIVE 4X5 TAN ST LF (GAUZE/BANDAGES/DRESSINGS) ×2 IMPLANT
BNDG ELASTIC 4X5.8 VLCR STR LF (GAUZE/BANDAGES/DRESSINGS) ×4 IMPLANT
BNDG ELASTIC 6X5.8 VLCR STR LF (GAUZE/BANDAGES/DRESSINGS) ×4 IMPLANT
BNDG ESMARK 6X12 TAN STRL LF (GAUZE/BANDAGES/DRESSINGS) ×2 IMPLANT
CHLORAPREP W/TINT 26 (MISCELLANEOUS) ×4 IMPLANT
COVER LIGHT HANDLE STERIS (MISCELLANEOUS) ×4 IMPLANT
CUFF TOURN SGL QUICK 24 (TOURNIQUET CUFF)
CUFF TOURN SGL QUICK 34 (TOURNIQUET CUFF)
CUFF TRNQT CYL 24X4X16.5-23 (TOURNIQUET CUFF) IMPLANT
CUFF TRNQT CYL 34X4.125X (TOURNIQUET CUFF) IMPLANT
DRAPE C-ARM XRAY 36X54 (DRAPES) ×2 IMPLANT
DRAPE C-ARMOR (DRAPES) ×2 IMPLANT
DRAPE INCISE IOBAN 66X45 STRL (DRAPES) ×2 IMPLANT
DRAPE ORTHO SPLIT 77X108 STRL (DRAPES) ×1
DRAPE SURG ORHT 6 SPLT 77X108 (DRAPES) ×1 IMPLANT
DRAPE U-SHAPE 47X51 STRL (DRAPES) ×4 IMPLANT
ELECT CAUTERY BLADE 6.4 (BLADE) ×2 IMPLANT
ELECT REM PT RETURN 9FT ADLT (ELECTROSURGICAL) ×2
ELECTRODE REM PT RTRN 9FT ADLT (ELECTROSURGICAL) ×1 IMPLANT
GAUZE 4X4 16PLY ~~LOC~~+RFID DBL (SPONGE) ×2 IMPLANT
GAUZE SPONGE 4X4 12PLY STRL (GAUZE/BANDAGES/DRESSINGS) ×2 IMPLANT
GAUZE XEROFORM 1X8 LF (GAUZE/BANDAGES/DRESSINGS) ×2 IMPLANT
GLOVE SURG ENC MOIS LTX SZ8 (GLOVE) ×4 IMPLANT
GLOVE SURG UNDER LTX SZ8 (GLOVE) ×2 IMPLANT
GOWN STRL REUS W/ TWL LRG LVL3 (GOWN DISPOSABLE) ×1 IMPLANT
GOWN STRL REUS W/ TWL XL LVL3 (GOWN DISPOSABLE) ×1 IMPLANT
GOWN STRL REUS W/TWL LRG LVL3 (GOWN DISPOSABLE) ×1
GOWN STRL REUS W/TWL XL LVL3 (GOWN DISPOSABLE) ×1
K-WIRE ACE 1.6X6 (WIRE) ×4
KIT TURNOVER KIT A (KITS) ×2 IMPLANT
KWIRE ACE 1.6X6 (WIRE) ×2 IMPLANT
LABEL OR SOLS (LABEL) ×2 IMPLANT
MANIFOLD NEPTUNE II (INSTRUMENTS) ×2 IMPLANT
NS IRRIG 1000ML POUR BTL (IV SOLUTION) ×2 IMPLANT
PACK EXTREMITY ARMC (MISCELLANEOUS) ×2 IMPLANT
PAD ABD DERMACEA PRESS 5X9 (GAUZE/BANDAGES/DRESSINGS) ×4 IMPLANT
PAD CAST CTTN 4X4 STRL (SOFTGOODS) ×2 IMPLANT
PAD PREP 24X41 OB/GYN DISP (PERSONAL CARE ITEMS) IMPLANT
PADDING CAST COTTON 4X4 STRL (SOFTGOODS) ×2
PLATE LOCK 6H 77 BILAT FIB (Plate) ×2 IMPLANT
PUTTY DBX 1CC (Putty) ×2 IMPLANT
PUTTY DBX 1CC DEPUY (Putty) ×1 IMPLANT
SCREW ACE CAN 4.0 40M (Screw) ×2 IMPLANT
SCREW ACE CAN 4.0 50M (Screw) ×4 IMPLANT
SCREW CORTICAL 3.5MM 22MM (Screw) ×2 IMPLANT
SCREW LOCK 3.5X12 DIST TIB (Screw) ×2 IMPLANT
SCREW LOCK 3.5X14 DIST TIB (Screw) ×4 IMPLANT
SCREW LOCK CORT STAR 3.5X14 (Screw) ×2 IMPLANT
SCREW LOW PROFILE 12MMX3.5MM (Screw) ×2 IMPLANT
SCREW NON LOCKING LP 3.5 14MM (Screw) ×2 IMPLANT
SPLINT CAST 1 STEP 4X30 (MISCELLANEOUS) ×4 IMPLANT
SPONGE T-LAP 18X18 ~~LOC~~+RFID (SPONGE) ×2 IMPLANT
STAPLER SKIN PROX 35W (STAPLE) ×2 IMPLANT
STOCKINETTE IMPERV 14X48 (MISCELLANEOUS) ×2 IMPLANT
SUT VIC AB 0 CT1 36 (SUTURE) ×2 IMPLANT
SUT VIC AB 2-0 SH 27 (SUTURE) ×2
SUT VIC AB 2-0 SH 27XBRD (SUTURE) ×2 IMPLANT
SUT VIC AB 3-0 SH 27 (SUTURE) ×1
SUT VIC AB 3-0 SH 27X BRD (SUTURE) ×1 IMPLANT
SYR 10ML LL (SYRINGE) ×2 IMPLANT
WATER STERILE IRR 500ML POUR (IV SOLUTION) ×2 IMPLANT

## 2021-01-20 NOTE — Anesthesia Preprocedure Evaluation (Addendum)
Anesthesia Evaluation  Patient identified by MRN, date of birth, ID band Patient awake    Reviewed: Allergy & Precautions, H&P , NPO status , Patient's Chart, lab work & pertinent test results  Airway Mallampati: III       Dental  (+) Poor Dentition, Missing,    Pulmonary shortness of breath and with exertion, Current SmokerPatient did not abstain from smoking., former smoker,  Has history of intubation in the past in the setting of aspiration pneumonia related to seizure   Pulmonary exam normal        Cardiovascular Exercise Tolerance: Poor hypertension, Pt. on medications Normal cardiovascular exam     Neuro/Psych  Headaches, Seizures - (eizure disorder with recurrent breakthrough seizures, hospitalized in July/22), Poorly Controlled,  PSYCHIATRIC DISORDERS Anxiety Schizoaffective disorderTIA (2016)   GI/Hepatic negative GI ROS, (+)     substance abuse  marijuana use,   Endo/Other  negative endocrine ROS  Renal/GU negative Renal ROS  negative genitourinary   Musculoskeletal  (+) Arthritis ,   Abdominal (+) + obese,   Peds negative pediatric ROS (+)  Hematology negative hematology ROS (+)   Anesthesia Other Findings Pt with ankle fx after a mechanical fall down 3-4 stairs. Denies prelude of seizure activity.  CT HEAD: IMPRESSION: 1. No acute brain injury. 2. Minimal chronic ischemic microvascular disease. 3. No acute cervical spine injury. 4. Mild to moderate spondylosis of the cervical spine with disc disease at the C6-7 level. Mild multilevel neural foraminal narrowing as described.  EKG: NSR  CXR: No acute findings    Reproductive/Obstetrics negative OB ROS                         Anesthesia Physical Anesthesia Plan  ASA: 3  Anesthesia Plan: General   Post-op Pain Management: Regional block   Induction:   PONV Risk Score and Plan: Propofol infusion, TIVA and Treatment  may vary due to age or medical condition  Airway Management Planned: Natural Airway and Nasal Cannula  Additional Equipment:   Intra-op Plan:   Post-operative Plan:   Informed Consent: I have reviewed the patients History and Physical, chart, labs and discussed the procedure including the risks, benefits and alternatives for the proposed anesthesia with the patient or authorized representative who has indicated his/her understanding and acceptance.     Consent reviewed with POA and Dental advisory given  Plan Discussed with: Anesthesiologist and CRNA  Anesthesia Plan Comments: (Discussed with Son, Bernardina Cacho)      Anesthesia Quick Evaluation

## 2021-01-20 NOTE — Transfer of Care (Signed)
Immediate Anesthesia Transfer of Care Note  Patient: Sandra Charles  Procedure(s) Performed: OPEN REDUCTION INTERNAL FIXATION (ORIF) ANKLE FRACTURE (Right: Ankle)  Patient Location: PACU  Anesthesia Type:General  Level of Consciousness: awake and alert   Airway & Oxygen Therapy: Patient Spontanous Breathing and Patient connected to face mask oxygen  Post-op Assessment: Report given to RN and Post -op Vital signs reviewed and stable  Post vital signs: Reviewed and stable  Last Vitals:  Vitals Value Taken Time  BP 154/68 01/20/21 1520  Temp    Pulse 66 01/20/21 1524  Resp 14 01/20/21 1524  SpO2 100 % 01/20/21 1524  Vitals shown include unvalidated device data.  Last Pain:  Vitals:   01/20/21 1210  TempSrc:   PainSc: 7       Patients Stated Pain Goal: 0 (00/45/99 7741)  Complications: No notable events documented.

## 2021-01-20 NOTE — Progress Notes (Signed)
Initial Nutrition Assessment  DOCUMENTATION CODES:   Obesity unspecified  INTERVENTION:   -Once diet is advanced, add:  -Ensure Enlive po BID, each supplement provides 350 kcal and 20 grams of protein  -MVI with minerals daily  NUTRITION DIAGNOSIS:   Increased nutrient needs related to post-op healing as evidenced by estimated needs.  GOAL:   Patient will meet greater than or equal to 90% of their needs  MONITOR:   PO intake, Supplement acceptance, Diet advancement, Labs, Weight trends, Skin, I & O's  REASON FOR ASSESSMENT:   Consult Assessment of nutrition requirement/status  ASSESSMENT:   Sandra Charles is a 65 y.o. female with medical history significant for Seizure disorder with recurrent breakthrough seizures, hospitalized in July/22, most recently seen in the ED on 11/18 for same, as well as history of anxiety, chronic cannabis use, depression, headaches, TIA, who presents with a mechanical fall after she missed a step falling down 3-4 stairs injuring her right ankle.  The fall was not preceded by a seizure, lightheadedness or dizziness, visual disturbance or one-sided weakness, numbness or tingling, no chest pain or shortness of breath or palpitations.  She did not hit her head and did not have loss of consciousness.  She denies prior alcohol or substance abuse  Pt admitted with trimalleolar fracture of rt ankle with posterior dislocation.   11/24- s/p rt ankle reduction  Reviewed I/O's: +1 L x 24 hours  Per MD notes, plan for surgical intervention today. Pt currently NPO for procedure.    Reviewed wt hx; wt has been stable over the past 4 months.   Pt with increased nutritional needs for post-operative healing and would benefit from addition of oral nutrition supplements.   Medications reviewed and include 0.9% sodium chloride infusion @ 75 ml/hr.   Labs reviewed.   Diet Order:   Diet Order             Diet NPO time specified Except for: Sips with Meds   Diet effective midnight                   EDUCATION NEEDS:   No education needs have been identified at this time  Skin:  Skin Assessment: Reviewed RN Assessment  Last BM:  Unknown  Height:   Ht Readings from Last 1 Encounters:  01/19/21 5\' 2"  (1.575 m)    Weight:   Wt Readings from Last 1 Encounters:  01/19/21 79.2 kg    Ideal Body Weight:  50 kg  BMI:  Body mass index is 31.94 kg/m.  Estimated Nutritional Needs:   Kcal:  1550-1750  Protein:  85-100 grams  Fluid:  > 1.5 L    Loistine Chance, RD, LDN, [place] Registered Dietitian II Certified Diabetes Care and Education Specialist Please refer to [facility] for RD and/or RD on-call/weekend/after hours pager

## 2021-01-20 NOTE — Consult Note (Signed)
ORTHOPAEDIC CONSULTATION  REQUESTING PHYSICIAN: Antonieta Pert, MD  Chief Complaint:   Right ankle injury.  History of Present Illness: Sandra Charles is a 65 y.o. female with multiple medical problems including hypertension, hyperlipidemia, seizure disorder, gastroesophageal reflux disease, and anxiety/depression.  The patient was in her usual state of health yesterday when she apparently fell down 3-4 steps at her home and injured her right ankle.  She was brought to the emergency room where x-rays demonstrated the above-noted injury.  The right ankle fracture this location was reduced by the ER provider and splinted.  Because there was concern that she may have had another seizure, she has been admitted for further work-up as well as for definitive management of her ankle injury.  The patient denies any associated injuries.  She did not strike her head or lose consciousness.  The patient also denies any lightheadedness, dizziness, chest pain, shortness of breath, or other symptoms which may have precipitated her fall.  Past Medical History:  Diagnosis Date   Anxiety    Arthritis    "all over"   Chronic lower back pain    Depression    GERD (gastroesophageal reflux disease)    Headache    "weekly" (07/15/2015)   Hyperlipidemia    Hypertension    Mini stroke    "several since 05/2014" (07/15/2015)   Seizures ([place]) dx'd 04/2015   Past Surgical History:  Procedure Laterality Date   DILATION AND CURETTAGE OF UTERUS     LUMBAR PUNCTURE  07/14/2015   TUBAL LIGATION     VAGINAL HYSTERECTOMY     Social History   Socioeconomic History   Marital status: Single    Spouse name: Not on file   Number of children: 1   Years of education: Not on file   Highest education level: Not on file  Occupational History   Occupation: disabled  Tobacco Use   Smoking status: Former    Packs/day: 1.00    Years: 5.00    Pack years: 5.00     Types: Cigarettes   Smokeless tobacco: Never   Tobacco comments:    "quit smoking cigarettes in the early 2000s"  Substance and Sexual Activity   Alcohol use: Yes    Alcohol/week: 0.0 standard drinks    Comment: 07/15/2015 "nothing in the last few years"   Drug use: No   Sexual activity: Not Currently  Other Topics Concern   Not on file  Social History Narrative   Lives with significant other, has son who comes by daily.   Social Determinants of Health   Financial Resource Strain: Not on file  Food Insecurity: Not on file  Transportation Needs: Not on file  Physical Activity: Not on file  Stress: Not on file  Social Connections: Not on file   Family History  Problem Relation Age of Onset   Hyperlipidemia Mother    Breast cancer Mother 6   Hypertension Mother    Heart attack Mother        age 5's   Hyperlipidemia Father    Hypertension Father    Heart attack Father 60   Breast cancer Sister 62   Allergies  Allergen Reactions   Lovastatin Other (See Comments)    Unknown reaction   Prior to Admission medications   Medication Sig Start Date End Date Taking? Authorizing Provider  amLODipine (NORVASC) 10 MG tablet Take 10 mg by mouth daily. For high blood pressure   Yes [provider]  aspirin EC 81  MG tablet Take 81 mg by mouth every morning.    Yes [provider]  Cholecalciferol (VITAMIN D3) 5000 UNITS TABS Take 5,000 Units by mouth every morning.    Yes [provider]  cloBAZam (ONFI) 10 MG tablet Take 0.5 tablets (5 mg total) by mouth at bedtime. 01/13/21  Yes Ward, Delice Bison, DO  cloNIDine (CATAPRES) 0.1 MG tablet Take 1 tablet (0.1 mg total) by mouth 2 (two) times daily. 07/15/15  Yes Wieting, Richard, MD  donepezil (ARICEPT) 10 MG tablet Take 10 mg by mouth at bedtime.   Yes [provider]  lamoTRIgine (LAMICTAL) 100 MG tablet 100 mg in morning and 125 mg in evening. 08/31/20  Yes Lavina Hamman, MD  lamoTRIgine (LAMICTAL) 25  MG tablet Take 1 tablet (25 mg total) by mouth every evening. Along with 100 mg tablet for total of 125 mg in evening. 08/31/20  Yes Lavina Hamman, MD  pantoprazole (PROTONIX) 40 MG tablet Take 1 tablet (40 mg total) by mouth daily. 11/25/14  Yes Wellington Hampshire, MD  propranolol ER (INDERAL LA) 60 MG 24 hr capsule Take 60 mg by mouth daily. 09/15/17  Yes [provider]  QUEtiapine (SEROQUEL XR) 50 MG TB24 24 hr tablet Take 50 mg by mouth at bedtime.   Yes [provider]  sertraline (ZOLOFT) 100 MG tablet Take 100 mg by mouth daily. 08/30/17  Yes [provider]  vitamin B-12 (CYANOCOBALAMIN) 1000 MCG tablet Take 1,000 mcg by mouth daily.   Yes [provider]  benzonatate (TESSALON) 100 MG capsule Take 1 capsule (100 mg total) by mouth 3 (three) times daily. 08/31/20   Lavina Hamman, MD   DG Knee 2 Views Right  Result Date: 01/19/2021 CLINICAL DATA:  Rule out fracture.  Injury.  Fall.  Pain. EXAM: RIGHT KNEE - 1-2 VIEW COMPARISON:  None. FINDINGS: Enthesopathic changes at the superior patella. No effusion. No fracture identified. IMPRESSION: Negative. Electronically Signed   By: Dorise Bullion III M.D.   On: 01/19/2021 17:15   DG Ankle 2 Views Right  Result Date: 01/19/2021 CLINICAL DATA:  Fracture dislocation, reduction EXAM: RIGHT ANKLE - 2 VIEW COMPARISON:  01/19/2021 FINDINGS: Frontal and lateral views of the right ankle are obtained. Casting material obscures underlying bony detail. There is stable dorsal and lateral subluxation of the talus relative to the tibial plafond, without significant change since the preceding study. Trimalleolar fracture again noted. Diffuse soft tissue swelling. IMPRESSION: 1. Stable dorsal and lateral subluxation of the talus, unchanged since prior study. Stable trimalleolar fracture. Electronically Signed   By: Randa Ngo M.D.   On: 01/19/2021 21:51   DG Ankle 2 Views Right  Result Date: 01/19/2021 CLINICAL DATA:  Right  ankle pain after fall EXAM: RIGHT FOOT COMPLETE - 3+ VIEW; RIGHT ANKLE - 2 VIEW COMPARISON:  None. FINDINGS: Acute trimalleolar right ankle fracture with posterior dislocation of the talus relative to the tibia. Fracture evaluation is limited by nonstandard projections. Best possible images were obtained. No fractures identified within the right foot. Subtalar alignment is maintained. Diffuse soft tissue swelling at the fracture site. IMPRESSION: 1. Acute trimalleolar right ankle fracture with posterior dislocation of the talus relative to the tibia. 2. No fractures identified within the right foot. Electronically Signed   By: Davina Poke D.O.   On: 01/19/2021 17:18   CT HEAD WO CONTRAST (5MM)  Result Date: 01/19/2021 CLINICAL DATA:  Abnormal mental status. Head trauma as missed step and fell.  EXAM: CT HEAD WITHOUT CONTRAST CT CERVICAL SPINE WITHOUT CONTRAST TECHNIQUE: Multidetector CT imaging of the head and cervical spine was performed following the standard protocol without intravenous contrast. Multiplanar CT image reconstructions of the cervical spine were also generated. COMPARISON:  Head CT 08/25/2020 FINDINGS: CT HEAD FINDINGS Brain: Ventricles, cisterns and other CSF spaces are normal. There is minimal chronic ischemic microvascular disease. There is no mass, mass effect, shift of midline structures or acute hemorrhage. No evidence of acute infarction. Vascular: No hyperdense vessel or unexpected calcification. Skull: Normal. Negative for fracture or focal lesion. Sinuses/Orbits: No acute finding. Other: None. CT CERVICAL SPINE FINDINGS Alignment: Mild reversal of the normal cervical lordosis. No evidence of subluxation. Skull base and vertebrae: Mild to moderate spondylosis of the cervical spine to include uncovertebral joint spurring and facet arthropathy. Vertebral body heights are normal. Mild right-sided neural foraminal narrowing at the C3-4 level. Minimal bilateral neural foraminal at the  C6-7 level and mild left-sided neural from narrowing at the C5-6 level. No acute fracture. Soft tissues and spinal canal: Normal. Disc levels:  Disc space narrowing at the C6-7 level. Upper chest: No acute findings. Other: None. IMPRESSION: 1. No acute brain injury. 2. Minimal chronic ischemic microvascular disease. 3. No acute cervical spine injury. 4. Mild to moderate spondylosis of the cervical spine with disc disease at the C6-7 level. Mild multilevel neural foraminal narrowing as described. Electronically Signed   By: Marin Olp M.D.   On: 01/19/2021 19:10   CT Cervical Spine Wo Contrast  Result Date: 01/19/2021 CLINICAL DATA:  Abnormal mental status. Head trauma as missed step and fell. EXAM: CT HEAD WITHOUT CONTRAST CT CERVICAL SPINE WITHOUT CONTRAST TECHNIQUE: Multidetector CT imaging of the head and cervical spine was performed following the standard protocol without intravenous contrast. Multiplanar CT image reconstructions of the cervical spine were also generated. COMPARISON:  Head CT 08/25/2020 FINDINGS: CT HEAD FINDINGS Brain: Ventricles, cisterns and other CSF spaces are normal. There is minimal chronic ischemic microvascular disease. There is no mass, mass effect, shift of midline structures or acute hemorrhage. No evidence of acute infarction. Vascular: No hyperdense vessel or unexpected calcification. Skull: Normal. Negative for fracture or focal lesion. Sinuses/Orbits: No acute finding. Other: None. CT CERVICAL SPINE FINDINGS Alignment: Mild reversal of the normal cervical lordosis. No evidence of subluxation. Skull base and vertebrae: Mild to moderate spondylosis of the cervical spine to include uncovertebral joint spurring and facet arthropathy. Vertebral body heights are normal. Mild right-sided neural foraminal narrowing at the C3-4 level. Minimal bilateral neural foraminal at the C6-7 level and mild left-sided neural from narrowing at the C5-6 level. No acute fracture. Soft tissues  and spinal canal: Normal. Disc levels:  Disc space narrowing at the C6-7 level. Upper chest: No acute findings. Other: None. IMPRESSION: 1. No acute brain injury. 2. Minimal chronic ischemic microvascular disease. 3. No acute cervical spine injury. 4. Mild to moderate spondylosis of the cervical spine with disc disease at the C6-7 level. Mild multilevel neural foraminal narrowing as described. Electronically Signed   By: Marin Olp M.D.   On: 01/19/2021 19:10   Chest Portable 1 View  Result Date: 01/19/2021 CLINICAL DATA:  [place] EXAM: PORTABLE CHEST 1 VIEW COMPARISON:  08/27/2020 FINDINGS: Single frontal view of the chest demonstrates an unremarkable cardiac silhouette. No airspace disease, effusion, or pneumothorax. No acute bony abnormalities. IMPRESSION: 1. No acute intrathoracic process. Electronically Signed   By: Randa Ngo M.D.   On: 01/19/2021 21:51   DG Ankle  Right Port  Result Date: 01/19/2021 CLINICAL DATA:  Postreduction of the right ankle. EXAM: PORTABLE RIGHT ANKLE - 2 VIEW COMPARISON:  Knee radiograph dated 01/19/2021. FINDINGS: Displaced fractures of the distal fibula and medial malleolus with lateral angulation and displacement of the distal fracture fragment and lateral subluxation of the ankle mortise. There is a displaced fracture of the posterior malleolus. There has been interval decrease in the degree of the dorsal dislocation of the ankle compared to prior radiograph. A partial cast is noted. IMPRESSION: Displaced trimalleolar fracture status post cast placement. Electronically Signed   By: Anner Crete M.D.   On: 01/19/2021 20:52   DG Foot Complete Right  Result Date: 01/19/2021 CLINICAL DATA:  Right ankle pain after fall EXAM: RIGHT FOOT COMPLETE - 3+ VIEW; RIGHT ANKLE - 2 VIEW COMPARISON:  None. FINDINGS: Acute trimalleolar right ankle fracture with posterior dislocation of the talus relative to the tibia. Fracture evaluation is limited by nonstandard projections.  Best possible images were obtained. No fractures identified within the right foot. Subtalar alignment is maintained. Diffuse soft tissue swelling at the fracture site. IMPRESSION: 1. Acute trimalleolar right ankle fracture with posterior dislocation of the talus relative to the tibia. 2. No fractures identified within the right foot. Electronically Signed   By: Davina Poke D.O.   On: 01/19/2021 17:18   [place] OR NERVE BLOCK-IMAGE ONLY [facility])  Result Date: 01/20/2021 There is no interpretation for this exam.  This order is for images obtained during a surgical procedure.  Please See "Surgeries" Tab for more information regarding the procedure.   DG MINI C-ARM IMAGE ONLY  Result Date: 01/20/2021 There is no interpretation for this exam.  This order is for images obtained during a surgical procedure.  Please See "Surgeries" Tab for more information regarding the procedure.    Positive ROS: All other systems have been reviewed and were otherwise negative with the exception of those mentioned in the HPI and as above.  Physical Exam: General:  Alert, no acute distress Psychiatric:  Patient is competent for consent with normal mood and affect   Cardiovascular:  No pedal edema Respiratory:  No wheezing, non-labored breathing GI:  Abdomen is soft and non-tender Skin:  No lesions in the area of chief complaint Neurologic:  Sensation intact distally Lymphatic:  No axillary or cervical lymphadenopathy  Orthopedic Exam:  Orthopedic examination is limited to the right lower extremity and foot.  The right lower leg and foot is in a posterior splint with sugar-tong supplement, maintaining the ankle in near neutral flexion.  The splint appears to be in good condition.  The skin is intact at the proximal distal ends of the splint.  She is able dorsiflex and plantarflex her toes.  Sensations intact to light touch to all distributions.  She has good capillary refill to her toes.  X-rays:  Pre and  postreduction views of the right ankle are available for review and have been reviewed by myself.  These films demonstrate a trimalleolar fracture dislocation of the ankle with improved reduction on the postreduction images.  There is a short posterior oblique fracture of the distal fibula and a short oblique medial malleolar fracture.  There also is a small avulsion fracture off the posterior aspect of the distal tibia.  No significant degenerative changes are identified.  No lytic lesions are other acute bony abnormalities are identified.  Assessment: Closed displaced trimalleolar fracture dislocation, right ankle.  Plan: The treatment options, including both surgical and nonsurgical choices, have  been discussed in detail with the patient and her son (by phone).  Both the patient and her son would like to proceed with surgical intervention to include a open reduction and internal fixation of her trimalleolar right ankle fracture.  The risks (including bleeding, infection, nerve and/or blood vessel injury, persistent or recurrent pain, loosening or failure of the components, leg length inequality, dislocation, need for further surgery, blood clots, strokes, heart attacks or arrhythmias, pneumonia, etc.) and benefits of the surgical procedure were discussed.  The patient and her son state their understanding and agree to proceed.  A formal written consent has been obtained.  Thank you for asking me to participate in the care of this unfortunate woman.  I will be happy to follow her with you.   Pascal Lux, MD  Beeper #:  [phone]  01/20/2021 12:42 PM

## 2021-01-20 NOTE — Progress Notes (Signed)
PROGRESS NOTE    Sandra Charles  ZOX:[id number] DOB: 06/16/1955 DOA: 01/19/2021 PCP: Sandra Jacobs, MD   Chief Complaint  Patient presents with   Ankle Injury  Brief Narrative/Hospital Course: Sandra Charles, 65 y.o. female with PMH of seizure disorder with recurrent breakthrough seizure recent hospitalization in July 2022, and recently seen in ED on 11/18 for the same, anxiety/depression, chronic cannabis use, headaches, TIA brought to the ED after a fall after missing a step while going down and coming with a right ankle injury where x-ray showed "XRAY: Acute trimalleolar right ankle fracture with posterior dislocation of the talus relative to the tibia".  Chest x-ray no acute finding, CT head CT C-spine degenerative changes, chronic microvascular ischemic changes, in the ED hypertensive blood work, EKG are done, relatively stable orthopedic was consulted and admitted for further management  Subjective: Seen and examined in the ED pain is controlled, no new complaints.  No chest pain nausea vomiting. Assessment & Plan:  Trimalleolar fracture of ankle, closed, right after mechanical fall: S/P worsened in the ED, orthopedic surgery consulted for operative intervention.  Continue SCD for DVT prophylaxis for now, n.p.o. IV fluids pain control. Further plan per orthopedics, preop risk stratification completed on admission.  Essential hypertension: Borderline controlled continue amlodipine propanolol and IV meds Seizure disorder: With episode of recurrent seizures ED visits and hospitalization, continue Lamictal clobazam and monitor  Anxiety/depression schizoaffective disorder: Continue Seroquel and sertraline  History of TIA on aspirin  Class I Obesity:Patient's Body mass index is 31.94 kg/m. : Will benefit with PCP follow-up, weight loss  healthy lifestyle and outpatient sleep evaluation.  DVT prophylaxis: SCDs Start: 01/19/21 2133 chemical prophylaxis likely tomorrow await orthopedics  input Code Status:   Code Status: Full Code Family Communication: plan of care discussed with patient at bedside. Status is: Admitted as observation Remains hospitalized for ongoing management of fracture and need for operative intervention. Disposition: Currently not medically stable for discharge. Anticipated Disposition:TBD  Objective: Vitals last 24 hrs: Vitals:   01/20/21 0112 01/20/21 0642 01/20/21 0700 01/20/21 0944  BP: (!) 181/80 (!) 162/69 (!) 156/69 (!) 176/82  Pulse:  61 63 63  Resp:  13 12 18   Temp: 98.3 F (36.8 C) 98.3 F (36.8 C)  98 F (36.7 C)  TempSrc: Oral Oral  Oral  SpO2:  95% 91% 94%  Weight:      Height:       Weight change:   Intake/Output Summary (Last 24 hours) at 01/20/2021 1110 Last data filed at 01/19/2021 2156 Gross per 24 hour  Intake 1000 ml  Output --  Net 1000 ml   Net IO Since Admission: 1,000 mL [01/20/21 1110]   Physical Examination: General exam: AAox3, weak,older than stated age. HEENT:Oral mucosa moist, Ear/Nose WNL grossly,dentition normal. Respiratory system: B/l diminished BS, no use of accessory muscle, non tender. Cardiovascular system: S1 & S2 +,No JVD. Gastrointestinal system: Abdomen soft, NT,ND, BS+. Nervous System:Alert, awake, moving extremities. Extremities: edema none, right foot on cast/dressing Skin: No rashes, no icterus. MSK: Normal muscle bulk, tone, power.  Medications reviewed:  Scheduled Meds:  amLODipine  10 mg Oral Daily   aspirin EC  81 mg Oral BH-q7a   cloBAZam  5 mg Oral QHS   hydrALAZINE  25 mg Oral Q8H   lamoTRIgine  100 mg Oral BID   lamoTRIgine  25 mg Oral QPM   propofol  40 mg Intravenous Once   propofol  80 mg Intravenous Once   Continuous  Infusions:  sodium chloride 75 mL/hr at 01/20/21 0622    ceFAZolin (ANCEF) IV     sodium chloride     Diet Order             Diet NPO time specified Except for: Sips with Meds  Diet effective midnight                   Weight change:    Wt Readings from Last 3 Encounters:  01/19/21 79.2 kg  01/13/21 79.2 kg  08/31/20 89 kg  Consultants:see note  Procedures:see note Antimicrobials: Anti-infectives (From admission, onward)    Start     Dose/Rate Route Frequency Ordered Stop   01/20/21 0000  ceFAZolin (ANCEF) IVPB 2g/100 mL premix        2 g 200 mL/hr over 30 Minutes Intravenous 30 min pre-op 01/19/21 1746        Culture/Microbiology    Component Value Date/Time   SDES BLOOD RIGHT HAND 08/26/2020 0255   SPECREQUEST AEROBIC BOTTLE ONLY Blood Culture adequate volume 08/26/2020 0255   CULT  08/26/2020 0255    NO GROWTH 5 DAYS Performed at [facility], [place] [address], [place], [place] [address]    REPTSTATUS 08/31/2020 FINAL 08/26/2020 0255    Other culture-see note  Unresulted Labs (From admission, onward)     Start     Ordered   01/20/21 0937  Resp Panel by RT-PCR (Flu A&B, Covid) Nasopharyngeal Swab  (Tier 2 - Symptomatic/asymptomatic)  Once,   STAT        01/20/21 0737           Data Reviewed: I have personally reviewed following labs and imaging studies CBC: Recent Labs  Lab 01/20/21 0603  WBC 8.9  NEUTROABS 6.8  HGB 11.2*  HCT 37.4  MCV 73.5*  PLT 106   Basic Metabolic Panel: Recent Labs  Lab 01/20/21 0603  NA 138  K 3.5  CL 102  CO2 27  GLUCOSE 123*  BUN 11  CREATININE 0.74  CALCIUM 9.2   GFR: Estimated Creatinine Clearance: 70.1 mL/min (by C-G formula based on SCr of 0.74 mg/dL). Liver Function Tests: Recent Labs  Lab 01/20/21 0603  AST 38  ALT 51*  ALKPHOS 105  BILITOT 0.7  PROT 7.9  ALBUMIN 4.0   No results for input(s): LIPASE, AMYLASE in the last 168 hours. No results for input(s): AMMONIA in the last 168 hours. Coagulation Profile: No results for input(s): INR, PROTIME in the last 168 hours. Cardiac Enzymes: No results for input(s): CKTOTAL, CKMB, CKMBINDEX, TROPONINI in the last 168 hours. BNP (last 3 results) No results for input(s): PROBNP in the  last 8760 hours. HbA1C: No results for input(s): HGBA1C in the last 72 hours. CBG: No results for input(s): GLUCAP in the last 168 hours. Lipid Profile: No results for input(s): CHOL, HDL, LDLCALC, TRIG, CHOLHDL, LDLDIRECT in the last 72 hours. Thyroid Function Tests: No results for input(s): TSH, T4TOTAL, FREET4, T3FREE, THYROIDAB in the last 72 hours. Anemia Panel: No results for input(s): VITAMINB12, FOLATE, FERRITIN, TIBC, IRON, RETICCTPCT in the last 72 hours. Sepsis Labs: No results for input(s): PROCALCITON, LATICACIDVEN in the last 168 hours.  No results found for this or any previous visit (from the past 240 hour(s)).   Radiology Studies: DG Knee 2 Views Right  Result Date: 01/19/2021 CLINICAL DATA:  Rule out fracture.  Injury.  Fall.  Pain. EXAM: RIGHT KNEE - 1-2 VIEW COMPARISON:  None. FINDINGS: Enthesopathic changes  at the superior patella. No effusion. No fracture identified. IMPRESSION: Negative. Electronically Signed   By: Dorise Bullion III M.D.   On: 01/19/2021 17:15   DG Ankle 2 Views Right  Result Date: 01/19/2021 CLINICAL DATA:  Fracture dislocation, reduction EXAM: RIGHT ANKLE - 2 VIEW COMPARISON:  01/19/2021 FINDINGS: Frontal and lateral views of the right ankle are obtained. Casting material obscures underlying bony detail. There is stable dorsal and lateral subluxation of the talus relative to the tibial plafond, without significant change since the preceding study. Trimalleolar fracture again noted. Diffuse soft tissue swelling. IMPRESSION: 1. Stable dorsal and lateral subluxation of the talus, unchanged since prior study. Stable trimalleolar fracture. Electronically Signed   By: Randa Ngo M.D.   On: 01/19/2021 21:51   DG Ankle 2 Views Right  Result Date: 01/19/2021 CLINICAL DATA:  Right ankle pain after fall EXAM: RIGHT FOOT COMPLETE - 3+ VIEW; RIGHT ANKLE - 2 VIEW COMPARISON:  None. FINDINGS: Acute trimalleolar right ankle fracture with posterior  dislocation of the talus relative to the tibia. Fracture evaluation is limited by nonstandard projections. Best possible images were obtained. No fractures identified within the right foot. Subtalar alignment is maintained. Diffuse soft tissue swelling at the fracture site. IMPRESSION: 1. Acute trimalleolar right ankle fracture with posterior dislocation of the talus relative to the tibia. 2. No fractures identified within the right foot. Electronically Signed   By: Davina Poke D.O.   On: 01/19/2021 17:18   CT HEAD WO CONTRAST (5MM)  Result Date: 01/19/2021 CLINICAL DATA:  Abnormal mental status. Head trauma as missed step and fell. EXAM: CT HEAD WITHOUT CONTRAST CT CERVICAL SPINE WITHOUT CONTRAST TECHNIQUE: Multidetector CT imaging of the head and cervical spine was performed following the standard protocol without intravenous contrast. Multiplanar CT image reconstructions of the cervical spine were also generated. COMPARISON:  Head CT 08/25/2020 FINDINGS: CT HEAD FINDINGS Brain: Ventricles, cisterns and other CSF spaces are normal. There is minimal chronic ischemic microvascular disease. There is no mass, mass effect, shift of midline structures or acute hemorrhage. No evidence of acute infarction. Vascular: No hyperdense vessel or unexpected calcification. Skull: Normal. Negative for fracture or focal lesion. Sinuses/Orbits: No acute finding. Other: None. CT CERVICAL SPINE FINDINGS Alignment: Mild reversal of the normal cervical lordosis. No evidence of subluxation. Skull base and vertebrae: Mild to moderate spondylosis of the cervical spine to include uncovertebral joint spurring and facet arthropathy. Vertebral body heights are normal. Mild right-sided neural foraminal narrowing at the C3-4 level. Minimal bilateral neural foraminal at the C6-7 level and mild left-sided neural from narrowing at the C5-6 level. No acute fracture. Soft tissues and spinal canal: Normal. Disc levels:  Disc space narrowing  at the C6-7 level. Upper chest: No acute findings. Other: None. IMPRESSION: 1. No acute brain injury. 2. Minimal chronic ischemic microvascular disease. 3. No acute cervical spine injury. 4. Mild to moderate spondylosis of the cervical spine with disc disease at the C6-7 level. Mild multilevel neural foraminal narrowing as described. Electronically Signed   By: Marin Olp M.D.   On: 01/19/2021 19:10   CT Cervical Spine Wo Contrast  Result Date: 01/19/2021 CLINICAL DATA:  Abnormal mental status. Head trauma as missed step and fell. EXAM: CT HEAD WITHOUT CONTRAST CT CERVICAL SPINE WITHOUT CONTRAST TECHNIQUE: Multidetector CT imaging of the head and cervical spine was performed following the standard protocol without intravenous contrast. Multiplanar CT image reconstructions of the cervical spine were also generated. COMPARISON:  Head CT 08/25/2020 FINDINGS: CT  HEAD FINDINGS Brain: Ventricles, cisterns and other CSF spaces are normal. There is minimal chronic ischemic microvascular disease. There is no mass, mass effect, shift of midline structures or acute hemorrhage. No evidence of acute infarction. Vascular: No hyperdense vessel or unexpected calcification. Skull: Normal. Negative for fracture or focal lesion. Sinuses/Orbits: No acute finding. Other: None. CT CERVICAL SPINE FINDINGS Alignment: Mild reversal of the normal cervical lordosis. No evidence of subluxation. Skull base and vertebrae: Mild to moderate spondylosis of the cervical spine to include uncovertebral joint spurring and facet arthropathy. Vertebral body heights are normal. Mild right-sided neural foraminal narrowing at the C3-4 level. Minimal bilateral neural foraminal at the C6-7 level and mild left-sided neural from narrowing at the C5-6 level. No acute fracture. Soft tissues and spinal canal: Normal. Disc levels:  Disc space narrowing at the C6-7 level. Upper chest: No acute findings. Other: None. IMPRESSION: 1. No acute brain injury. 2.  Minimal chronic ischemic microvascular disease. 3. No acute cervical spine injury. 4. Mild to moderate spondylosis of the cervical spine with disc disease at the C6-7 level. Mild multilevel neural foraminal narrowing as described. Electronically Signed   By: Marin Olp M.D.   On: 01/19/2021 19:10   Chest Portable 1 View  Result Date: 01/19/2021 CLINICAL DATA:  [place] EXAM: PORTABLE CHEST 1 VIEW COMPARISON:  08/27/2020 FINDINGS: Single frontal view of the chest demonstrates an unremarkable cardiac silhouette. No airspace disease, effusion, or pneumothorax. No acute bony abnormalities. IMPRESSION: 1. No acute intrathoracic process. Electronically Signed   By: Randa Ngo M.D.   On: 01/19/2021 21:51   DG Ankle Right Port  Result Date: 01/19/2021 CLINICAL DATA:  Postreduction of the right ankle. EXAM: PORTABLE RIGHT ANKLE - 2 VIEW COMPARISON:  Knee radiograph dated 01/19/2021. FINDINGS: Displaced fractures of the distal fibula and medial malleolus with lateral angulation and displacement of the distal fracture fragment and lateral subluxation of the ankle mortise. There is a displaced fracture of the posterior malleolus. There has been interval decrease in the degree of the dorsal dislocation of the ankle compared to prior radiograph. A partial cast is noted. IMPRESSION: Displaced trimalleolar fracture status post cast placement. Electronically Signed   By: Anner Crete M.D.   On: 01/19/2021 20:52   DG Foot Complete Right  Result Date: 01/19/2021 CLINICAL DATA:  Right ankle pain after fall EXAM: RIGHT FOOT COMPLETE - 3+ VIEW; RIGHT ANKLE - 2 VIEW COMPARISON:  None. FINDINGS: Acute trimalleolar right ankle fracture with posterior dislocation of the talus relative to the tibia. Fracture evaluation is limited by nonstandard projections. Best possible images were obtained. No fractures identified within the right foot. Subtalar alignment is maintained. Diffuse soft tissue swelling at the fracture  site. IMPRESSION: 1. Acute trimalleolar right ankle fracture with posterior dislocation of the talus relative to the tibia. 2. No fractures identified within the right foot. Electronically Signed   By: Davina Poke D.O.   On: 01/19/2021 17:18     LOS: 0 days   Antonieta Pert, MD [facility]  01/20/2021, 11:10 AM

## 2021-01-20 NOTE — Anesthesia Procedure Notes (Signed)
Anesthesia Regional Block: Adductor canal block   Pre-Anesthetic Checklist: , timeout performed,  Correct Patient, Correct Site, Correct Laterality,  Correct Procedure, Correct Position, site marked,  Risks and benefits discussed,  Surgical consent,  Pre-op evaluation,  At surgeon's request and post-op pain management  Laterality: Right  Prep: chloraprep       Needles:  Injection technique: Single-shot  Needle Type: Stimiplex     Needle Length: 5cm  Needle Gauge: 22     Additional Needles:   Procedures:,,,, ultrasound used (permanent image in chart),,    Narrative:  Start time: 01/20/2021 12:25 PM End time: 01/20/2021 12:26 PM Injection made incrementally with aspirations every 10 mL.  Performed by: Personally  Anesthesiologist: Iran Ouch, MD  Additional Notes: Patient consented for risk and benefits of nerve block including but not limited to nerve damage, failed block, bleeding and infection.  Patient voiced understanding.  Functioning IV was confirmed and monitors were applied.  Timeout done prior to procedure and prior to any sedation being given to the patient.  Patient confirmed procedure site prior to any sedation given to the patient.  A 39mm 22ga Stimuplex needle was used. Sterile prep,hand hygiene and sterile gloves were used.  Minimal sedation used for procedure.  No paresthesia endorsed by patient during the procedure.  Negative aspiration and negative test dose prior to incremental administration of local anesthetic. The patient tolerated the procedure well with no immediate complications.

## 2021-01-20 NOTE — Anesthesia Procedure Notes (Signed)
Procedure Name: MAC Date/Time: 01/20/2021 1:00 PM Performed by: Jerrye Noble, CRNA Pre-anesthesia Checklist: Patient identified, Emergency Drugs available, Suction available and Patient being monitored Patient Re-evaluated:Patient Re-evaluated prior to induction Oxygen Delivery Method: Simple face mask

## 2021-01-20 NOTE — Progress Notes (Signed)
Pt states pain level -10/10 on pain scale. Administered IV Dilaudid per PRN order.

## 2021-01-20 NOTE — Anesthesia Postprocedure Evaluation (Signed)
Anesthesia Post Note  Patient: Sandra Charles  Procedure(s) Performed: OPEN REDUCTION INTERNAL FIXATION (ORIF) ANKLE FRACTURE (Right: Ankle)  Patient location during evaluation: PACU Anesthesia Type: General Level of consciousness: awake and alert Pain management: pain level controlled Vital Signs Assessment: post-procedure vital signs reviewed and stable Respiratory status: spontaneous breathing, nonlabored ventilation, respiratory function stable and patient connected to nasal cannula oxygen Cardiovascular status: blood pressure returned to baseline and stable Postop Assessment: no apparent nausea or vomiting Anesthetic complications: no  No notable events documented.   Last Vitals:  Vitals:   01/20/21 1600 01/20/21 1620  BP: 133/68 (!) 144/69  Pulse: 69 76  Resp: 16 15  Temp:    SpO2: 94% 94%    Last Pain:  Vitals:   01/20/21 1550  TempSrc:   PainSc: 0-No pain                 Arita Miss

## 2021-01-20 NOTE — Anesthesia Procedure Notes (Signed)
Anesthesia Regional Block: Popliteal block   Pre-Anesthetic Checklist: , timeout performed,  Correct Patient, Correct Site, Correct Laterality,  Correct Procedure, Correct Position, site marked,  Risks and benefits discussed,  Surgical consent,  Pre-op evaluation,  At surgeon's request and post-op pain management  Laterality: Right  Prep: chloraprep       Needles:  Injection technique: Single-shot  Needle Type: Stimiplex     Needle Length: 5cm  Needle Gauge: 22     Additional Needles:   Procedures:,,,, ultrasound used (permanent image in chart),,    Narrative:  Start time: 01/20/2021 12:20 PM End time: 01/20/2021 12:25 PM Injection made incrementally with aspirations every 20 mL.  Performed by: Personally  Anesthesiologist: Iran Ouch, MD  Additional Notes: Patient consented for risk and benefits of nerve block including but not limited to nerve damage, failed block, bleeding and infection.  Patient voiced understanding.  Functioning IV was confirmed and monitors were applied.  Timeout done prior to procedure and prior to any sedation being given to the patient.  Patient confirmed procedure site prior to any sedation given to the patient.  A 39mm 22ga Stimuplex needle was used. Sterile prep,hand hygiene and sterile gloves were used.  Minimal sedation used for procedure.  No paresthesia endorsed by patient during the procedure.  Negative aspiration and negative test dose prior to incremental administration of local anesthetic. The patient tolerated the procedure well with no immediate complications.

## 2021-01-20 NOTE — ED Notes (Signed)
Awaiting transport to OR.

## 2021-01-20 NOTE — Op Note (Signed)
01/20/2021  3:27 PM  Patient:   Sandra Charles  Pre-Op Diagnosis:   Closed displaced trimalleolar fracture dislocation, right ankle.  Post-Op Diagnosis:   Same.  Procedure:   Open reduction and internal fixation of medial and lateral malleolar fractures, right ankle.  Surgeon:   Pascal Lux, MD  Assistant:   None  Anesthesia:   Popliteal block with IV sedation  Findings:   As above.  Complications:   None  EBL:   20 cc  Fluids:   800 cc crystalloid  UOP:   None  TT:   98 min at 250 mmHg  Drains:   None  Closure:   Staples  Implants:   Biomet ALPS 7-hole composite locking plate and screws  Brief Clinical Note:   The patient is a 65 year old female who sustained the above-noted injury yesterday afternoon when she apparently stumbled down 3-4 steps in her home. She was brought to the emergency room where x-rays demonstrated the above-noted injury. The fracture was reduced by the ER provider and splinted.  The patient subsequent was admitted for work-up of her syncopal episode as well as for definitive management of her injury.  Procedure:   The patient underwent placement of a popliteal block in the preoperative holding area before she was brought into the operating room and laid in the supine position. After adequate IV sedation was obtained, the right foot and lower leg were prepped with ChloraPrep solution, then draped sterilely. Preoperative antibiotics were administered. A timeout was performed to verify the appropriate surgical site before the limb was exsanguinated with an Esmarch and the calf tourniquet inflated to 250 mmHg.   Laterally, an 8-10 cm incision was made over the lateral aspect of the distal fibula. The incision was carried down through the subcutaneous tissues to expose the fracture site. The fracture hematoma was debrided before the fracture was reduced and temporarily secured using a bone clamp. There was some comminution involving the lateral and  anterior cortex of the distal fibular fracture at the level of the plafond. A fragment was identified but this fragment could not be keyed in satisfactorily. Therefore, it was removed and recontoured so that the cortical window could be adequately filled. The other pieces were placed as bone graft, along with 0.5 cc of demineralized bone matrix putty.    A lag screw was placed in an anterior to posterior direction perpendicular to the fracture. A 7-hole Biomet composite locking plate was contoured using the appropriate plate benders before it was applied over the lateral aspect of the distal fibula. After verifying its position fluoroscopically, it was secured using a 3.5 mm nonlocking cortical screw proximal to the fracture. Again the plate's position was adjusted slightly based on AP and lateral projections before it was secured using a second bicortical screw proximally and four locking screws distally. The adequacy of fracture reduction and hardware position was verified fluoroscopically in AP and lateral projections and found to be excellent. Several locking screws distally were removed and replaced with shorter screws as they appeared to be too long.  Attention was directed to the medial side. An approximately 4 cm longitudinal incision was made over the anterior and distal portions of the medial malleolus. This incision also was carried down through the subcutaneous tissues to expose the fracture site. Care was taken to identify and protect the saphenous nerve and vein. The fracture hematoma again was removed before the fracture was reduced. Some comminution of the medial cortex was identified, so another  0.5 cc of demineralized bone matrix putty was injected into the fracture to help optimize healing. Two guidewires were placed obliquely across the fracture from distal to proximal into the distal tibial metaphysis. After verifying their positions fluoroscopically, each guidewire was sequentially  over-reamed and replaced with a 40 millimeter and 50 mm partially threaded 4.0 cancellous screw in lag fashion. Again the adequacy of fracture reduction, hardware position, and mortise restoration was verified in AP, lateral, and oblique projections and found to be excellent. The posterior malleolar fragment was quite small, involving at most 50% of the posterior articular surface, so it was elected not to stabilize the fragment.  Each wound was copiously irrigated with sterile saline solution. Laterally, the subcutaneous tissues were closed in two layers using 2-0 Vicryl and 3-0 Vicryl interrupted sutures before the skin was closed using staples. Medially, the subcutaneous tissues were closed using 3-0 Vicryl interrupted sutures before the skin was closed using staples. A total of 20 cc of a 50:50 mixture of 10 cc of Exparel and 10 cc of normal saline was injected in and around the incision sites to help with postoperative analgesia. Sterile bulky dressings were applied to the wounds before the patient was placed into a posterior splint with a sugar tong supplement, maintaining the ankle in neutral dorsiflexion. The patient was then awakened and returned to the recovery room in satisfactory condition after tolerating the procedure well.

## 2021-01-21 ENCOUNTER — Encounter: Payer: Self-pay | Admitting: Surgery

## 2021-01-21 DIAGNOSIS — S82851A Displaced trimalleolar fracture of right lower leg, initial encounter for closed fracture: Secondary | ICD-10-CM | POA: Diagnosis not present

## 2021-01-21 LAB — CBC
HCT: 33.9 % — ABNORMAL LOW (ref 36.0–46.0)
Hemoglobin: 10 g/dL — ABNORMAL LOW (ref 12.0–15.0)
MCH: 21.6 pg — ABNORMAL LOW (ref 26.0–34.0)
MCHC: 29.5 g/dL — ABNORMAL LOW (ref 30.0–36.0)
MCV: 73.4 fL — ABNORMAL LOW (ref 80.0–100.0)
Platelets: 205 10*3/uL (ref 150–400)
RBC: 4.62 MIL/uL (ref 3.87–5.11)
RDW: 22.1 % — ABNORMAL HIGH (ref 11.5–15.5)
WBC: 9.5 10*3/uL (ref 4.0–10.5)
nRBC: 0 % (ref 0.0–0.2)

## 2021-01-21 LAB — BASIC METABOLIC PANEL
Anion gap: 7 (ref 5–15)
BUN: 12 mg/dL (ref 8–23)
CO2: 25 mmol/L (ref 22–32)
Calcium: 9 mg/dL (ref 8.9–10.3)
Chloride: 105 mmol/L (ref 98–111)
Creatinine, Ser: 0.71 mg/dL (ref 0.44–1.00)
GFR, Estimated: >60 mL/min (ref >60)
Glucose, Bld: 96 mg/dL (ref 70–99)
Potassium: 3 mmol/L — ABNORMAL LOW (ref 3.5–5.1)
Sodium: 137 mmol/L (ref 135–145)

## 2021-01-21 MED ORDER — ASPIRIN EC 325 MG PO TBEC
325.0000 mg | DELAYED_RELEASE_TABLET | Freq: Every day | ORAL | 0 refills | Status: DC
Start: 2021-01-21 — End: 2021-07-07

## 2021-01-21 MED ORDER — PROPRANOLOL HCL ER 60 MG PO CP24
60.0000 mg | ORAL_CAPSULE | Freq: Every day | ORAL | Status: DC
  Administered 2021-01-21 – 2021-01-26 (×6): 60 mg via ORAL
  Filled 2021-01-21 (×6): qty 1

## 2021-01-21 MED ORDER — POTASSIUM CHLORIDE CRYS ER 20 MEQ PO TBCR
40.0000 meq | EXTENDED_RELEASE_TABLET | Freq: Once | ORAL | Status: AC
  Administered 2021-01-21: 40 meq via ORAL
  Filled 2021-01-21: qty 2

## 2021-01-21 MED ORDER — ROSUVASTATIN CALCIUM 10 MG PO TABS
20.0000 mg | ORAL_TABLET | Freq: Every day | ORAL | Status: DC
  Administered 2021-01-21 – 2021-01-25 (×5): 20 mg via ORAL
  Filled 2021-01-21 (×5): qty 2

## 2021-01-21 MED ORDER — HYDROCODONE-ACETAMINOPHEN 5-325 MG PO TABS: 1.0000 | ORAL_TABLET | Freq: Four times a day (QID) | ORAL | 0 refills | Status: DC | PRN

## 2021-01-21 MED ORDER — POTASSIUM CHLORIDE CRYS ER 20 MEQ PO TBCR
20.0000 meq | EXTENDED_RELEASE_TABLET | Freq: Once | ORAL | Status: AC
  Administered 2021-01-21: 20 meq via ORAL
  Filled 2021-01-21: qty 1

## 2021-01-21 NOTE — Progress Notes (Signed)
Physical Therapy Treatment Patient Details Name: Sandra Charles MRN: [id number] DOB: 1955/07/04 Today's Date: 01/21/2021   History of Present Illness 65 y/o female s/p fall with L ankle fx and now [place] s/p ORIF.    PT Comments    Pt sleepy initially and in generally was lower energy and much more limited with mobility and ability to do even minimal ambulation w/o AD.  She showed good effort as she was able, but generally did not do nearly as well as she did this AM and could not even get close to able to manage a step - struggling to even maintain NWBing on flat surface much less a step.    Recommendations for follow up therapy are one component of a multi-disciplinary discharge planning process, led by the attending physician.  Recommendations may be updated based on patient status, additional functional criteria and insurance authorization.  Follow Up Recommendations  Skilled nursing-short term rehab (<3 hours/day)     Assistance Recommended at Discharge Intermittent Supervision/Assistance  Equipment Recommendations  Rolling walker (2 wheels);BSC/3in1;Wheelchair (measurements PT) (if returning home)    Recommendations for Other Services       Precautions / Restrictions Precautions Precautions: Fall Restrictions RLE Weight Bearing: Non weight bearing     Mobility  Bed Mobility Overal bed mobility: Needs Assistance Bed Mobility: Sit to Supine       Sit to supine: Min guard   General bed mobility comments: needed L LE self, assist to get R into bed, unable to scoot in bed needing heavy assist    Transfers Overall transfer level: Needs assistance Equipment used: Rolling walker (2 wheels) Transfers: Sit to/from Stand Sit to Stand: Mod assist;Min assist           General transfer comment: repeated reminders for appropriate UE use and set up, despite this pt unable to appriopriately/consistently perform w/o direct assist    Ambulation/Gait Ambulation/Gait  assistance: Mod assist Gait Distance (Feet): 3 Feet Assistive device: Rolling walker (2 wheels)         General Gait Details: Pt did poorly with ambulation/mobility this session.  Not nearly as stable, confident (or effective with NWBing) as she was this AM   Stairs Stairs: Yes Stairs assistance: Max assist Stair Management: No rails;Backwards;With walker Number of Stairs: 0 General stair comments: educated and demonstrated appropriate stair negotiation, attempted multiple times to lift foot and do a single step.  Unable to clear foot even `" much less a full step even with direct assist to [employer] and constant cuing   Wheelchair Mobility    Modified Rankin (Stroke Patients Only)       Balance Overall balance assessment: Needs assistance Sitting-balance support: Bilateral upper extremity supported Sitting balance-Leahy Scale: Fair       Standing balance-Leahy Scale: Poor Standing balance comment: poor awareness, execution and general safety with standing mobility this afternoon                            Cognition Arousal/Alertness: Awake/alert Behavior During Therapy: Restless;Impulsive Overall Cognitive Status: Within Functional Limits for tasks assessed                                 General Comments: pt needing increased cuing and reinforcmeent t/o the session        Exercises      General Comments  Pertinent Vitals/Pain Pain Score: 8  Pain Location: R ankle    Home Living                          Prior Function            PT Goals (current goals can now be found in the care plan section) Progress towards PT goals: Not progressing toward goals - comment (less ability to maintain NWBing and with general mobility this afternoon)    Frequency    BID      PT Plan Current plan remains appropriate    Co-evaluation              AM-PAC PT "6 Clicks" Mobility   Outcome Measure  Help needed  turning from your back to your side while in a flat bed without using bedrails?: None Help needed moving from lying on your back to sitting on the side of a flat bed without using bedrails?: A Little Help needed moving to and from a bed to a chair (including a wheelchair)?: A Lot Help needed standing up from a chair using your arms (e.g., wheelchair or bedside chair)?: A Lot Help needed to walk in hospital room?: Total Help needed climbing 3-5 steps with a railing? : Total 6 Click Score: 13    End of Session Equipment Utilized During Treatment: Gait belt Activity Tolerance: Patient tolerated treatment well;Patient limited by fatigue Patient left: with call bell/phone within reach;with family/visitor present   PT Visit Diagnosis: Muscle weakness (generalized) (M62.81);Difficulty in walking, not elsewhere classified (R26.2);Pain Pain - Right/Left: Right Pain - part of body: Ankle and joints of foot     Time: 1610-9604 PT Time Calculation (min) (ACUTE ONLY): 28 min  Charges:  $Gait Training: 23-37 mins                     Sandra Charles, DPT 01/21/2021, 5:50 PM

## 2021-01-21 NOTE — TOC Initial Note (Signed)
Transition of Care [facility]) - Initial/Assessment Note    Patient Details  Name: Sandra Charles MRN: [id number] Date of Birth: July 29, 1955  [facility] CM/SW Contact:    Magnus Ivan, LCSW Phone Number: 01/21/2021, 12:44 PM  Clinical Narrative:                Spoke to patient regarding DC planning. PT assessed, current rec is SNF with hopes to improve to [facility] prior to DC tomorrow.  Patient stated she does not want to go to SNF, feels comfortable returning home with [facility]. States her fiance lives with her and will provide 24-7 care. Patient had [facility] in the past, unknown agency, no agency preference. Referral made to [facility] with [facility].  Patient agreeable to recs for RW, w/c, and 3 in 1. Spoke to [place] with [employer] who states patient's insurance will cover both RW and w/c if processed on different days. Ordered DME through [employer].  Patient goes to Dr. Jodi Mourning for PCP and uses [facility].  Confirmed home address.    Expected Discharge Plan: [place] Barriers to Discharge: Continued Medical Work up   Patient Goals and CMS Choice Patient states their goals for this hospitalization and ongoing recovery are:: home with home health CMS Medicare.gov Compare Post Acute Care list provided to:: Patient Choice offered to / list presented to : Patient  Expected Discharge Plan and Services Expected Discharge Plan: [place]       Living arrangements for the past 2 months: Single Family Home                 DME Arranged: 3-N-1, Walker rolling, Wheelchair manual DME Agency: [employer] Date DME Agency Contacted: 01/21/21   Representative spoke with at DME Agency: [place]: PT, OT, RN [place] Agency: [place] Date [place]: 01/21/21   Representative spoke with at [place]: Malachy Mood  Prior Living Arrangements/Services Living arrangements for the past 2 months: [place] Lives  with:: Significant Other Patient language and need for interpreter reviewed:: Yes Do you feel safe going back to the place where you live?: Yes      Need for Family Participation in Patient Care: Yes (Comment) Care giver support system in place?: Yes (comment)   Criminal Activity/Legal Involvement Pertinent to Current Situation/Hospitalization: No - Comment as needed  Activities of Daily Living   ADL Screening (condition at time of admission) Patient's cognitive ability adequate to safely complete daily activities?: Yes Is the patient deaf or have difficulty hearing?: No Does the patient have difficulty seeing, even when wearing glasses/contacts?: No Does the patient have difficulty concentrating, remembering, or making decisions?: Yes Patient able to express need for assistance with ADLs?: Yes Does the patient have difficulty dressing or bathing?: No Independently performs ADLs?: Yes (appropriate for developmental age) Does the patient have difficulty walking or climbing stairs?: Yes Weakness of Legs: Both Weakness of Arms/Hands: None  Permission Sought/Granted Permission sought to share information with : Chartered certified accountant granted to share information with : Yes, Verbal Permission Granted     Permission granted to share info w AGENCY: [place] & DME agencies        Emotional Assessment       Orientation: : Oriented to Self, Oriented to Place, Oriented to  Time, Oriented to Situation Alcohol / Substance Use: Not Applicable Psych Involvement: No (comment)  Admission diagnosis:  Pain [R52] Fall [W19.XXXA] Closed  trimalleolar fracture of right ankle, initial encounter [S82.851A] Trimalleolar fracture of ankle, closed, right, initial encounter [S82.851A] Status post open reduction with internal fixation (ORIF) of fracture of ankle [Z98.890, Z87.81] Patient Active Problem List   Diagnosis Date Noted   Trimalleolar fracture of ankle, closed, right, initial  encounter 01/19/2021   Accidental fall 01/19/2021   Preoperative clearance 01/19/2021   Seizures ([place]) 08/26/2020   Respiratory failure with hypoxia ([place])    Aspiration into airway    Acute pulmonary edema (HCC)    Tetrahydrocannabinol (THC) dependence ([place])    Sepsis ([place]) 09/22/2018   Altered mental status 03/27/2018   HLD (hyperlipidemia) 10/05/2017   Seizure ([place])    UTI (urinary tract infection) 08/17/2015   Seizure disorder ([place]) 08/10/2015   Cannabis use disorder, moderate, dependence ([place]) 08/10/2015   Schizoaffective disorder ([place]) 08/10/2015   Abnormal finding on MRI of brain    Hypokalemia 07/15/2015   Confusion    GERD (gastroesophageal reflux disease) 11/25/2014   Essential hypertension 11/25/2014   PCP:  Ashley Jacobs, MD Pharmacy:   [facility] [address], [place] - [place] [address] [place] [address] Phone: [phone] Fax: [place], [place] - [employer] [place] [place] [address] Phone: [phone] Fax: [phone]     Social Determinants of Health (SDOH) Interventions    Readmission Risk Interventions No flowsheet data found.

## 2021-01-21 NOTE — Evaluation (Addendum)
Physical Therapy Evaluation Patient Details Name: Sandra Charles MRN: [id number] DOB: 03-16-1955 Today's Date: 01/21/2021  History of Present Illness  65 y/o female s/p fall with L ankle fx and now [place] s/p ORIF.  Clinical Impression  Pt was eager to work with PT and see what she could do.  She was able to maintain NWBing relatively well but needed to work very hard with the walker and was quick to fatigue with only 20 ft of ambulation and despite much cuing and being motivated to do so could not manage any more hopping.  Hope to trial stairs this afternoon, suspect she will struggle with clearing foot to negotiate steps.  She showed great effort and motivation t/o the PT exam and subsequent mobility task. Currently recommending STR, but hope to improve enough to be able to go home.  She does have help around the home most of the time, but will need to show increased mobility and activity tolerance. Patient suffers from newly fractured ankle which impairs his/her ability to perform daily activities like toileting, feeding, dressing, grooming, bathing in the home. A cane, walker, crutch will not resolve the patient's issue with performing activities of daily living. A lightweight wheelchair with swing-away and elevating leg rests is required/recommended and will allow patient to safely perform daily activities.   Patient can safely propel the wheelchair in the home or has a caregiver who can provide assistance.       Recommendations for follow up therapy are one component of a multi-disciplinary discharge planning process, led by the attending physician.  Recommendations may be updated based on patient status, additional functional criteria and insurance authorization.  Follow Up Recommendations Skilled nursing-short term rehab (<3 hours/day) (hoping to improve well enough to be able to go home)    Assistance Recommended at Discharge Intermittent Supervision/Assistance  Functional Status  Assessment Patient has had a recent decline in their functional status and demonstrates the ability to make significant improvements in function in a reasonable and predictable amount of time.  Equipment Recommendations  Rolling walker (2 wheels);BSC/3in1;Wheelchair (measurements PT)    Recommendations for Other Services       Precautions / Restrictions Precautions Precautions: Fall Restrictions Weight Bearing Restrictions: Yes RLE Weight Bearing: Non weight bearing      Mobility  Bed Mobility Overal bed mobility: Modified Independent             General bed mobility comments: Pt was able to get herself up to EOB w/o assist    Transfers Overall transfer level: Needs assistance Equipment used: Rolling walker (2 wheels) Transfers: Sit to/from Stand Sit to Stand: Min guard;Min assist           General transfer comment: Pt was able to rise so standing with only very light assist, plenty of cuing for appropriate UE use and to insure NWBing    Ambulation/Gait Ambulation/Gait assistance: Min assist Gait Distance (Feet): 20 Feet Assistive device: Rolling walker (2 wheels)         General Gait Details: Pt did well with maintaining NWBing but did fatigue quickly.  O2 and HR remained appropriate for activity but UEs and generally she could not push herself to do more.  Stairs            Wheelchair Mobility    Modified Rankin (Stroke Patients Only)       Balance Overall balance assessment: Needs assistance Sitting-balance support: Bilateral upper extremity supported   Sitting balance - Comments: able to maintain static  sitting w/o assist     Standing balance-Leahy Scale: Fair Standing balance comment: with heavy UE use she was able to maintain NWBing and her balance well, poor tolerance with fatigue                             Pertinent Vitals/Pain Pain Assessment: 0-10 Pain Score: 6  Pain Location: R ankle    Home Living Family/patient  expects to be discharged to:: Private residence Living Arrangements: Spouse/significant other Available Help at Discharge: Available PRN/intermittently;Family Type of Home: House Home Access: Stairs to enter Entrance Stairs-Rails: Left Entrance Stairs-Number of Steps: 3     Home Equipment: None      Prior Function Prior Level of Function : Independent/Modified Independent                     Hand Dominance        Extremity/Trunk Assessment   Upper Extremity Assessment Upper Extremity Assessment: Overall WFL for tasks assessed    Lower Extremity Assessment Lower Extremity Assessment: Overall WFL for tasks assessed       Communication   Communication: No difficulties  Cognition Arousal/Alertness: Awake/alert Behavior During Therapy: WFL for tasks assessed/performed Overall Cognitive Status: Within Functional Limits for tasks assessed                                          General Comments      Exercises     Assessment/Plan    PT Assessment Patient needs continued PT services  PT Problem List Decreased strength;Decreased activity tolerance;Decreased range of motion;Decreased balance;Decreased mobility;Decreased cognition;Decreased knowledge of use of DME;Decreased safety awareness;Pain       PT Treatment Interventions DME instruction;Gait training;Stair training;Functional mobility training;Therapeutic activities;Therapeutic exercise;Balance training;Neuromuscular re-education;Patient/family education    PT Goals (Current goals can be found in the Care Plan section)  Acute Rehab PT Goals Patient Stated Goal: go home PT Goal Formulation: With patient Time For Goal Achievement: 02/04/21 Potential to Achieve Goals: Fair    Frequency BID   Barriers to discharge        Co-evaluation               AM-PAC PT "6 Clicks" Mobility  Outcome Measure Help needed turning from your back to your side while in a flat bed without using  bedrails?: None Help needed moving from lying on your back to sitting on the side of a flat bed without using bedrails?: None Help needed moving to and from a bed to a chair (including a wheelchair)?: A Little Help needed standing up from a chair using your arms (e.g., wheelchair or bedside chair)?: A Little Help needed to walk in hospital room?: A Lot Help needed climbing 3-5 steps with a railing? : Total 6 Click Score: 17    End of Session Equipment Utilized During Treatment: Gait belt Activity Tolerance: Patient tolerated treatment well;Patient limited by fatigue Patient left: with call bell/phone within reach;with family/visitor present Nurse Communication: Mobility status PT Visit Diagnosis: Muscle weakness (generalized) (M62.81);Difficulty in walking, not elsewhere classified (R26.2);Pain Pain - Right/Left: Right Pain - part of body: Ankle and joints of foot    Time: 1751-0258 PT Time Calculation (min) (ACUTE ONLY): 39 min   Charges:   PT Evaluation $PT Eval Low Complexity: 1 Low PT Treatments $Gait Training: 8-22 mins $Therapeutic  Activity: 8-22 mins        Kreg Shropshire, DPT 01/21/2021, 1:45 PM

## 2021-01-21 NOTE — Progress Notes (Signed)
PROGRESS NOTE    Sandra Charles  NOM:[id number] DOB: 1955/11/04 DOA: 01/19/2021 PCP: Ashley Jacobs, MD   Chief Complaint  Patient presents with   Ankle Injury  Brief Narrative/Hospital Course: Sandra Charles, 65 y.o. female with PMH of seizure disorder with recurrent breakthrough seizure recent hospitalization in July 2022, and recently seen in ED on 11/18 for the same, anxiety/depression, chronic cannabis use, headaches, TIA brought to the ED after a fall after missing a step while going down and coming with a right ankle injury where x-ray showed "XRAY: Acute trimalleolar right ankle fracture with posterior dislocation of the talus relative to the tibia".  Chest x-ray no acute finding, CT head CT C-spine degenerative changes, chronic microvascular ischemic changes, in the ED hypertensive blood work, EKG are done, relatively stable orthopedic was consulted and admitted for further management. underwent ORIF of medial and lateral malleolar fractures on right ankle by Dr. Roland Rack 11/25  Subjective: Seen and examined this morning.  Resting comfortably.  Pain is controlled son is at the bedside. Afebrile overnight saturating well on room air, potassium 3.0, hemoglobin slightly low . Assessment & Plan:  Trimalleolar fracture of ankle, closed, right after mechanical fall: s/p ORIF of medial and lateral malleolar fractures on right ankle by Dr. Roland Rack 11/25.  Continue pain control, DVT prophylaxis with Lovenox diet starting 11/26, PT OT.  Hypokalemia replete.  Essential hypertension: Fairly controlled continue home Amlodipine propanolol , clonidine and IV meds  Seizure disorder: With episode of recurrent seizures ED visits and hospitalization, also continue homeLamictal clobazam and monitor  Anxiety/depression schizoaffective disorder: mood stable cont sroquel and sertraline  History of TIA on aspirin  Class I Obesity:Patient's Body mass index is 31.94 kg/m. : Will benefit with PCP follow-up,  weight loss  healthy lifestyle and outpatient sleep evaluation.  DVT prophylaxis: enoxaparin (LOVENOX) injection 40 mg Start: 01/21/21 0800 SCDs Start: 01/20/21 1652 chemical prophylaxis likely tomorrow await orthopedics input Code Status:   Code Status: Full Code Family Communication: plan of care discussed with patient at bedside. Status is: Admitted as observation Remains hospitalized for ongoing management of fracture and need for operative intervention. Disposition: Currently not medically stable for discharge. Anticipated Disposition: likley HH tomorrow  Objective: Vitals last 24 hrs: Vitals:   01/20/21 1653 01/20/21 2124 01/21/21 0423 01/21/21 0740  BP: (!) 162/57 (!) 167/67 (!) 139/58 (!) 157/70  Pulse: 75 69 64 66  Resp:  17 18 17   Temp: 99 F (37.2 C) 98.8 F (37.1 C) 98.7 F (37.1 C) 98.6 F (37 C)  TempSrc: Oral  Oral   SpO2: 94% 91% 95% 96%  Weight:      Height:       Weight change:   Intake/Output Summary (Last 24 hours) at 01/21/2021 0809 Last data filed at 01/21/2021 0157 Gross per 24 hour  Intake 1904.88 ml  Output 910 ml  Net 994.88 ml    Net IO Since Admission: 1,994.88 mL [01/21/21 0809]   Physical Examination: General exam: AAOx 3 older than stated age, weak appearing. HEENT:Oral mucosa moist, Ear/Nose WNL grossly, dentition normal. Respiratory system: bilaterally diminished, no use of accessory muscle Cardiovascular system: S1 & S2 +, No JVD,. Gastrointestinal system: Abdomen soft, NT,ND, BS+ Nervous System:Alert, awake, moving extremities and grossly nonfocal Extremities: rt foot in dressing , on bedside chair Blood Shellman or foot skin: No rashes,no icterus. MSK: Normal muscle bulk,tone, power  Medications reviewed:  Scheduled Meds:  acetaminophen  500 mg Oral Q6H   amLODipine  10 mg Oral Daily   aspirin EC  81 mg Oral BH-q7a   cholecalciferol  5,000 Units Oral BH-q7a   cloBAZam  5 mg Oral QHS   cloNIDine  0.1 mg Oral BID   docusate  sodium  100 mg Oral BID   donepezil  10 mg Oral QHS   enoxaparin (LOVENOX) injection  40 mg Subcutaneous Q24H   feeding supplement  237 mL Oral BID BM   hydrALAZINE  25 mg Oral Q8H   ketorolac  7.5 mg Intravenous Q6H   lamoTRIgine  100 mg Oral BID   lamoTRIgine  25 mg Oral QPM   multivitamin with minerals  1 tablet Oral Daily   pantoprazole  40 mg Oral Daily   QUEtiapine  50 mg Oral QHS   sertraline  100 mg Oral Daily   vitamin B-12  1,000 mcg Oral Daily   Continuous Infusions:  sodium chloride 75 mL/hr at 01/21/21 0157   Diet Order             Diet regular Room service appropriate? Yes; Fluid consistency: Thin  Diet effective now                   Weight change:   Wt Readings from Last 3 Encounters:  01/19/21 79.2 kg  01/13/21 79.2 kg  08/31/20 89 kg  Consultants:see note  Procedures:see note Antimicrobials: Anti-infectives (From admission, onward)    Start     Dose/Rate Route Frequency Ordered Stop   01/20/21 1800  ceFAZolin (ANCEF) IVPB 2g/100 mL premix        2 g 200 mL/hr over 30 Minutes Intravenous Every 6 hours 01/20/21 1651 01/21/21 0709   01/20/21 1150  ceFAZolin (ANCEF) 2-4 GM/100ML-% IVPB       Note to Pharmacy: Norton Blizzard  : cabinet override      01/20/21 1150 01/20/21 1315   01/20/21 0000  ceFAZolin (ANCEF) IVPB 2g/100 mL premix        2 g 200 mL/hr over 30 Minutes Intravenous 30 min pre-op 01/19/21 1746 01/20/21 1336      Culture/Microbiology    Component Value Date/Time   SDES BLOOD RIGHT HAND 08/26/2020 0255   SPECREQUEST AEROBIC BOTTLE ONLY Blood Culture adequate volume 08/26/2020 0255   CULT  08/26/2020 0255    NO GROWTH 5 DAYS Performed at [facility], [place] [address] [place], [place] [address]    REPTSTATUS 08/31/2020 FINAL 08/26/2020 0255    Other culture-see note  Unresulted Labs (From admission, onward)    None      Data Reviewed: I have personally reviewed following labs and imaging studies CBC: Recent Labs   Lab 01/20/21 0603 01/21/21 0457  WBC 8.9 9.5  NEUTROABS 6.8  --   HGB 11.2* 10.0*  HCT 37.4 33.9*  MCV 73.5* 73.4*  PLT 241 497    Basic Metabolic Panel: Recent Labs  Lab 01/20/21 0603 01/21/21 0457  NA 138 137  K 3.5 3.0*  CL 102 105  CO2 27 25  GLUCOSE 123* 96  BUN 11 12  CREATININE 0.74 0.71  CALCIUM 9.2 9.0    GFR: Estimated Creatinine Clearance: 70.1 mL/min (by C-G formula based on SCr of 0.71 mg/dL). Liver Function Tests: Recent Labs  Lab 01/20/21 0603  AST 38  ALT 51*  ALKPHOS 105  BILITOT 0.7  PROT 7.9  ALBUMIN 4.0    No results for input(s): LIPASE, AMYLASE in the last 168 hours. No results for input(s): AMMONIA in  the last 168 hours. Coagulation Profile: No results for input(s): INR, PROTIME in the last 168 hours. Cardiac Enzymes: No results for input(s): CKTOTAL, CKMB, CKMBINDEX, TROPONINI in the last 168 hours. BNP (last 3 results) No results for input(s): PROBNP in the last 8760 hours. HbA1C: No results for input(s): HGBA1C in the last 72 hours. CBG: No results for input(s): GLUCAP in the last 168 hours. Lipid Profile: No results for input(s): CHOL, HDL, LDLCALC, TRIG, CHOLHDL, LDLDIRECT in the last 72 hours. Thyroid Function Tests: No results for input(s): TSH, T4TOTAL, FREET4, T3FREE, THYROIDAB in the last 72 hours. Anemia Panel: No results for input(s): VITAMINB12, FOLATE, FERRITIN, TIBC, IRON, RETICCTPCT in the last 72 hours. Sepsis Labs: No results for input(s): PROCALCITON, LATICACIDVEN in the last 168 hours.  Recent Results (from the past 240 hour(s))  Resp Panel by RT-PCR (Flu A&B, Covid) Nasopharyngeal Swab     Status: None   Collection Time: 01/20/21  9:40 AM   Specimen: Nasopharyngeal Swab; Nasopharyngeal(NP) swabs in vial transport medium  Result Value Ref Range Status   SARS Coronavirus 2 by RT PCR NEGATIVE NEGATIVE Final    Comment: (NOTE) SARS-CoV-2 target nucleic acids are NOT DETECTED.  The SARS-CoV-2 RNA is  generally detectable in upper respiratory specimens during the acute phase of infection. The lowest concentration of SARS-CoV-2 viral copies this assay can detect is 138 copies/mL. A negative result does not preclude SARS-Cov-2 infection and should not be used as the sole basis for treatment or other patient management decisions. A negative result may occur with  improper specimen collection/handling, submission of specimen other than nasopharyngeal swab, presence of viral mutation(s) within the areas targeted by this assay, and inadequate number of viral copies(<138 copies/mL). A negative result must be combined with clinical observations, patient history, and epidemiological information. The expected result is Negative.  Fact Sheet for Patients:  EntrepreneurPulse.com.au  Fact Sheet for Healthcare Providers:  IncredibleEmployment.be  This test is no t yet approved or cleared by the [place] FDA and  has been authorized for detection and/or diagnosis of SARS-CoV-2 by FDA under an Emergency Use Authorization (EUA). This EUA will remain  in effect (meaning this test can be used) for the duration of the COVID-19 declaration under Section 564(b)(1) of the Act, 21 U.S.C.section 360bbb-3(b)(1), unless the authorization is terminated  or revoked sooner.       Influenza A by PCR NEGATIVE NEGATIVE Final   Influenza B by PCR NEGATIVE NEGATIVE Final    Comment: (NOTE) The Xpert Xpress SARS-CoV-2/FLU/RSV plus assay is intended as an aid in the diagnosis of influenza from Nasopharyngeal swab specimens and should not be used as a sole basis for treatment. Nasal washings and aspirates are unacceptable for Xpert Xpress SARS-CoV-2/FLU/RSV testing.  Fact Sheet for Patients: EntrepreneurPulse.com.au  Fact Sheet for Healthcare Providers: IncredibleEmployment.be  This test is not yet approved or cleared by the [place] FDA and has been authorized for detection and/or diagnosis of SARS-CoV-2 by FDA under an Emergency Use Authorization (EUA). This EUA will remain in effect (meaning this test can be used) for the duration of the COVID-19 declaration under Section 564(b)(1) of the Act, 21 U.S.C. section 360bbb-3(b)(1), unless the authorization is terminated or revoked.  Performed at [facility], [address] [place], [place] [address]      Radiology Studies: DG Knee 2 Views Right  Result Date: 01/19/2021 CLINICAL DATA:  Rule out fracture.  Injury.  Fall.  Pain. EXAM: RIGHT KNEE - 1-2 VIEW COMPARISON:  None. FINDINGS: Enthesopathic changes at the superior patella. No effusion. No fracture identified. IMPRESSION: Negative. Electronically Signed   By: Dorise Bullion III M.D.   On: 01/19/2021 17:15   DG Ankle 2 Views Right  Result Date: 01/20/2021 CLINICAL DATA:  Trimalleolar fracture RIGHT ankle, closed, ORIF EXAM: RIGHT ANKLE - 2 VIEW COMPARISON:  01/19/2021 FLUOROSCOPY TIME:  0 minutes 43.9 seconds Images: 2 FINDINGS: Two cannulated screws placed across a reduced medial malleolar fracture. Lateral plate and multiple screws placed across a reduced lateral malleolar fracture. Again identified posterior malleolar fracture fragment. Joint spaces preserved. No additional fracture or bone destruction seen. IMPRESSION: Post ORIF of medial and lateral malleolar fractures. Again identified posterior malleolar fragment. Electronically Signed   By: Lavonia Dana M.D.   On: 01/20/2021 15:02   DG Ankle 2 Views Right  Result Date: 01/19/2021 CLINICAL DATA:  Fracture dislocation, reduction EXAM: RIGHT ANKLE - 2 VIEW COMPARISON:  01/19/2021 FINDINGS: Frontal and lateral views of the right ankle are obtained. Casting material obscures underlying bony detail. There is stable dorsal and lateral subluxation of the talus relative to the tibial plafond, without significant change since the preceding study.  Trimalleolar fracture again noted. Diffuse soft tissue swelling. IMPRESSION: 1. Stable dorsal and lateral subluxation of the talus, unchanged since prior study. Stable trimalleolar fracture. Electronically Signed   By: Randa Ngo M.D.   On: 01/19/2021 21:51   DG Ankle 2 Views Right  Result Date: 01/19/2021 CLINICAL DATA:  Right ankle pain after fall EXAM: RIGHT FOOT COMPLETE - 3+ VIEW; RIGHT ANKLE - 2 VIEW COMPARISON:  None. FINDINGS: Acute trimalleolar right ankle fracture with posterior dislocation of the talus relative to the tibia. Fracture evaluation is limited by nonstandard projections. Best possible images were obtained. No fractures identified within the right foot. Subtalar alignment is maintained. Diffuse soft tissue swelling at the fracture site. IMPRESSION: 1. Acute trimalleolar right ankle fracture with posterior dislocation of the talus relative to the tibia. 2. No fractures identified within the right foot. Electronically Signed   By: Davina Poke D.O.   On: 01/19/2021 17:18   CT HEAD WO CONTRAST (5MM)  Result Date: 01/19/2021 CLINICAL DATA:  Abnormal mental status. Head trauma as missed step and fell. EXAM: CT HEAD WITHOUT CONTRAST CT CERVICAL SPINE WITHOUT CONTRAST TECHNIQUE: Multidetector CT imaging of the head and cervical spine was performed following the standard protocol without intravenous contrast. Multiplanar CT image reconstructions of the cervical spine were also generated. COMPARISON:  Head CT 08/25/2020 FINDINGS: CT HEAD FINDINGS Brain: Ventricles, cisterns and other CSF spaces are normal. There is minimal chronic ischemic microvascular disease. There is no mass, mass effect, shift of midline structures or acute hemorrhage. No evidence of acute infarction. Vascular: No hyperdense vessel or unexpected calcification. Skull: Normal. Negative for fracture or focal lesion. Sinuses/Orbits: No acute finding. Other: None. CT CERVICAL SPINE FINDINGS Alignment: Mild reversal of  the normal cervical lordosis. No evidence of subluxation. Skull base and vertebrae: Mild to moderate spondylosis of the cervical spine to include uncovertebral joint spurring and facet arthropathy. Vertebral body heights are normal. Mild right-sided neural foraminal narrowing at the C3-4 level. Minimal bilateral neural foraminal at the C6-7 level and mild left-sided neural from narrowing at the C5-6 level. No acute fracture. Soft tissues and spinal canal: Normal. Disc levels:  Disc space narrowing at the C6-7 level. Upper chest: No acute findings. Other: None. IMPRESSION: 1. No acute brain injury. 2. Minimal chronic ischemic microvascular disease. 3. No acute cervical spine  injury. 4. Mild to moderate spondylosis of the cervical spine with disc disease at the C6-7 level. Mild multilevel neural foraminal narrowing as described. Electronically Signed   By: Marin Olp M.D.   On: 01/19/2021 19:10   CT Cervical Spine Wo Contrast  Result Date: 01/19/2021 CLINICAL DATA:  Abnormal mental status. Head trauma as missed step and fell. EXAM: CT HEAD WITHOUT CONTRAST CT CERVICAL SPINE WITHOUT CONTRAST TECHNIQUE: Multidetector CT imaging of the head and cervical spine was performed following the standard protocol without intravenous contrast. Multiplanar CT image reconstructions of the cervical spine were also generated. COMPARISON:  Head CT 08/25/2020 FINDINGS: CT HEAD FINDINGS Brain: Ventricles, cisterns and other CSF spaces are normal. There is minimal chronic ischemic microvascular disease. There is no mass, mass effect, shift of midline structures or acute hemorrhage. No evidence of acute infarction. Vascular: No hyperdense vessel or unexpected calcification. Skull: Normal. Negative for fracture or focal lesion. Sinuses/Orbits: No acute finding. Other: None. CT CERVICAL SPINE FINDINGS Alignment: Mild reversal of the normal cervical lordosis. No evidence of subluxation. Skull base and vertebrae: Mild to moderate  spondylosis of the cervical spine to include uncovertebral joint spurring and facet arthropathy. Vertebral body heights are normal. Mild right-sided neural foraminal narrowing at the C3-4 level. Minimal bilateral neural foraminal at the C6-7 level and mild left-sided neural from narrowing at the C5-6 level. No acute fracture. Soft tissues and spinal canal: Normal. Disc levels:  Disc space narrowing at the C6-7 level. Upper chest: No acute findings. Other: None. IMPRESSION: 1. No acute brain injury. 2. Minimal chronic ischemic microvascular disease. 3. No acute cervical spine injury. 4. Mild to moderate spondylosis of the cervical spine with disc disease at the C6-7 level. Mild multilevel neural foraminal narrowing as described. Electronically Signed   By: Marin Olp M.D.   On: 01/19/2021 19:10   Chest Portable 1 View  Result Date: 01/19/2021 CLINICAL DATA:  [place] EXAM: PORTABLE CHEST 1 VIEW COMPARISON:  08/27/2020 FINDINGS: Single frontal view of the chest demonstrates an unremarkable cardiac silhouette. No airspace disease, effusion, or pneumothorax. No acute bony abnormalities. IMPRESSION: 1. No acute intrathoracic process. Electronically Signed   By: Randa Ngo M.D.   On: 01/19/2021 21:51   DG Ankle Right Port  Result Date: 01/19/2021 CLINICAL DATA:  Postreduction of the right ankle. EXAM: PORTABLE RIGHT ANKLE - 2 VIEW COMPARISON:  Knee radiograph dated 01/19/2021. FINDINGS: Displaced fractures of the distal fibula and medial malleolus with lateral angulation and displacement of the distal fracture fragment and lateral subluxation of the ankle mortise. There is a displaced fracture of the posterior malleolus. There has been interval decrease in the degree of the dorsal dislocation of the ankle compared to prior radiograph. A partial cast is noted. IMPRESSION: Displaced trimalleolar fracture status post cast placement. Electronically Signed   By: Anner Crete M.D.   On: 01/19/2021 20:52   DG  Foot Complete Right  Result Date: 01/19/2021 CLINICAL DATA:  Right ankle pain after fall EXAM: RIGHT FOOT COMPLETE - 3+ VIEW; RIGHT ANKLE - 2 VIEW COMPARISON:  None. FINDINGS: Acute trimalleolar right ankle fracture with posterior dislocation of the talus relative to the tibia. Fracture evaluation is limited by nonstandard projections. Best possible images were obtained. No fractures identified within the right foot. Subtalar alignment is maintained. Diffuse soft tissue swelling at the fracture site. IMPRESSION: 1. Acute trimalleolar right ankle fracture with posterior dislocation of the talus relative to the tibia. 2. No fractures identified within the right foot. Electronically  Signed   By: Davina Poke D.O.   On: 01/19/2021 17:18   DG C-Arm 1-60 Min-No Report  Result Date: 01/20/2021 Fluoroscopy was utilized by the requesting physician.  No radiographic interpretation.   [place] OR NERVE BLOCK-IMAGE ONLY [facility])  Result Date: 01/20/2021 There is no interpretation for this exam.  This order is for images obtained during a surgical procedure.  Please See "Surgeries" Tab for more information regarding the procedure.   DG MINI C-ARM IMAGE ONLY  Result Date: 01/20/2021 There is no interpretation for this exam.  This order is for images obtained during a surgical procedure.  Please See "Surgeries" Tab for more information regarding the procedure.     LOS: 0 days   Antonieta Pert, MD [facility]  01/21/2021, 8:09 AM

## 2021-01-21 NOTE — Progress Notes (Signed)
Subjective: 1 Day Post-Op Procedure(s) (LRB): OPEN REDUCTION INTERNAL FIXATION (ORIF) ANKLE FRACTURE (Right) Patient reports pain as mild.   Patient is well, patient denies significant pain in the right ankle.  Admitted for seizure work-up as well. PT and care management to assist with discharge planning. Negative for chest pain and shortness of breath Fever: no Gastrointestinal:Negative for nausea and vomiting Patient is passing gas without pain this AM.  Objective: Vital signs in last 24 hours: Temp:  [97.3 F (36.3 C)-99 F (37.2 C)] 98.6 F (37 C) (11/26 0740) Pulse Rate:  [58-76] 66 (11/26 0740) Resp:  [10-18] 17 (11/26 0740) BP: (133-176)/(57-82) 157/70 (11/26 0740) SpO2:  [91 %-100 %] 96 % (11/26 0740)  Intake/Output from previous day:  Intake/Output Summary (Last 24 hours) at 01/21/2021 0839 Last data filed at 01/21/2021 0157 Gross per 24 hour  Intake 1904.88 ml  Output 910 ml  Net 994.88 ml    Intake/Output this shift: No intake/output data recorded.  Labs: Recent Labs    01/20/21 0603 01/21/21 0457  HGB 11.2* 10.0*   Recent Labs    01/20/21 0603 01/21/21 0457  WBC 8.9 9.5  RBC 5.09 4.62  HCT 37.4 33.9*  PLT 241 205   Recent Labs    01/20/21 0603 01/21/21 0457  NA 138 137  K 3.5 3.0*  CL 102 105  CO2 27 25  BUN 11 12  CREATININE 0.74 0.71  GLUCOSE 123* 96  CALCIUM 9.2 9.0   No results for input(s): LABPT, INR in the last 72 hours.   EXAM General - Patient is Alert and Appropriate Extremity - ABD soft Short leg splint intact to the right leg. No evidence of loosening.  Able to flex and extend toes on command. Cap refill intact to each toe. Intact to light touch over the dorsal and volar aspect of exposed foot and proximal aspect of the leg.  Past Medical History:  Diagnosis Date   Anxiety    Arthritis    "all over"   Chronic lower back pain    Depression    GERD (gastroesophageal reflux disease)    Headache    "weekly"  (07/15/2015)   Hyperlipidemia    Hypertension    Mini stroke    "several since 05/2014" (07/15/2015)   Seizures ([place]) dx'd 04/2015    Assessment/Plan: 1 Day Post-Op Procedure(s) (LRB): OPEN REDUCTION INTERNAL FIXATION (ORIF) ANKLE FRACTURE (Right) Principal Problem:   Trimalleolar fracture of ankle, closed, right, initial encounter Active Problems:   Essential hypertension   Seizure disorder (HCC)   Schizoaffective disorder ([place])   Accidental fall   Preoperative clearance  Estimated body mass index is 31.94 kg/m as calculated from the following:   Height as of this encounter: 5\' 2"  (1.575 m).   Weight as of this encounter: 79.2 kg. Advance diet Up with therapy D/C IV fluids when tolerating po intake.  Labs reviewed this AM. K+ 3.0, will supplement. Hg 10.0. Patient lives with fiance but does have several steps to go up and down. Up with therapy today, will need to be able to remain non-wb to the right leg. Patient may need SNF placement, will see how she does with therapy today. If she is able to perform well with therapy, will plan for discharge home tomorrow.  Denies history of DVT in past.  Start on Aspirin 325mg  daily on discharge. Follow-up with [facility] orthopaedics in 10-14 days for splint removal and x-rays.  DVT Prophylaxis - Lovenox Non-weightbearing to the right leg.  Raquel Toy Eisemann, PA-C [facility] Orthopaedic Surgery 01/21/2021, 8:39 AM

## 2021-01-22 DIAGNOSIS — S82851A Displaced trimalleolar fracture of right lower leg, initial encounter for closed fracture: Secondary | ICD-10-CM | POA: Diagnosis not present

## 2021-01-22 LAB — MAGNESIUM: Magnesium: 2.2 mg/dL (ref 1.7–2.4)

## 2021-01-22 MED ORDER — POTASSIUM CHLORIDE CRYS ER 20 MEQ PO TBCR
40.0000 meq | EXTENDED_RELEASE_TABLET | Freq: Once | ORAL | Status: AC
  Administered 2021-01-22: 10:00:00 40 meq via ORAL
  Filled 2021-01-22: qty 2

## 2021-01-22 MED ORDER — VITAMIN D3 125 MCG (5000 UT) PO TABS: 5000.0000 [IU] | ORAL_TABLET | ORAL | 0 refills | Status: AC

## 2021-01-22 MED ORDER — POTASSIUM CHLORIDE CRYS ER 20 MEQ PO TBCR
40.0000 meq | EXTENDED_RELEASE_TABLET | Freq: Every day | ORAL | Status: DC
Start: 2021-01-22 — End: 2021-01-26
  Administered 2021-01-22 – 2021-01-26 (×5): 40 meq via ORAL
  Filled 2021-01-22 (×5): qty 2

## 2021-01-22 NOTE — Progress Notes (Signed)
Subjective: 2 Days Post-Op Procedure(s) (LRB): OPEN REDUCTION INTERNAL FIXATION (ORIF) ANKLE FRACTURE (Right) Patient reports pain as mild.   Patient is well, patient denies significant pain in the right ankle. Current recommendation is for SNF however patient would like to return home with HHPT. Negative for chest pain and shortness of breath Fever: no Gastrointestinal:Negative for nausea and vomiting Patient is passing gas without pain this AM.  Has not had a BM yet.  Objective: Vital signs in last 24 hours: Temp:  [97.5 F (36.4 C)-99.5 F (37.5 C)] 97.5 F (36.4 C) (11/27 0812) Pulse Rate:  [60-86] 60 (11/27 0812) Resp:  [17-19] 17 (11/27 0812) BP: (129-160)/(53-75) 129/69 (11/27 0812) SpO2:  [95 %-99 %] 98 % (11/27 0812)  Intake/Output from previous day:  Intake/Output Summary (Last 24 hours) at 01/22/2021 0858 Last data filed at 01/21/2021 2100 Gross per 24 hour  Intake 240 ml  Output --  Net 240 ml    Intake/Output this shift: No intake/output data recorded.  Labs: Recent Labs    01/20/21 0603 01/21/21 0457  HGB 11.2* 10.0*   Recent Labs    01/20/21 0603 01/21/21 0457  WBC 8.9 9.5  RBC 5.09 4.62  HCT 37.4 33.9*  PLT 241 205   Recent Labs    01/20/21 0603 01/21/21 0457  NA 138 137  K 3.5 3.0*  CL 102 105  CO2 27 25  BUN 11 12  CREATININE 0.74 0.71  GLUCOSE 123* 96  CALCIUM 9.2 9.0   No results for input(s): LABPT, INR in the last 72 hours.   EXAM General - Patient is Alert and Appropriate Extremity - ABD soft Short leg splint intact to the right leg. No evidence of loosening.  Able to flex and extend toes on command. Cap refill intact to each toe. Intact to light touch over the dorsal and volar aspect of exposed foot and proximal aspect of the leg.  Past Medical History:  Diagnosis Date   Anxiety    Arthritis    "all over"   Chronic lower back pain    Depression    GERD (gastroesophageal reflux disease)    Headache    "weekly"  (07/15/2015)   Hyperlipidemia    Hypertension    Mini stroke    "several since 05/2014" (07/15/2015)   Seizures ([place]) dx'd 04/2015    Assessment/Plan: 2 Days Post-Op Procedure(s) (LRB): OPEN REDUCTION INTERNAL FIXATION (ORIF) ANKLE FRACTURE (Right) Principal Problem:   Trimalleolar fracture of ankle, closed, right, initial encounter Active Problems:   Essential hypertension   Seizure disorder (HCC)   Schizoaffective disorder ([place])   Accidental fall   Preoperative clearance  Estimated body mass index is 31.94 kg/m as calculated from the following:   Height as of this encounter: 5\' 2"  (1.575 m).   Weight as of this encounter: 79.2 kg. Advance diet Up with therapy D/C IV fluids when tolerating po intake.  Vitals reviewed this AM. Patient lives with fiance but does have several steps to go up and down. Up with therapy today, will need to be able to remain non-wb to the right leg. Current recommendation is for d/c to SNF however patient would like to return home. If she does well with PT today can d/c home with HHPT.  Start on Aspirin 325mg  daily on discharge. Follow-up with [facility] orthopaedics in 10-14 days for splint removal and x-rays.  DVT Prophylaxis - Lovenox Non-weightbearing to the right leg.  Raquel Vickye Astorino, PA-C [facility] 01/22/2021, 8:58  AM  

## 2021-01-22 NOTE — Discharge Instructions (Signed)
Diet: As you were doing prior to hospitalization   Shower:  May shower but keep the wounds dry, use an occlusive plastic wrap, NO SOAKING IN TUB.  If the bandage gets wet, change with a clean dry gauze.  Dressing:  Keep splint on and dry until first post-op visit.  Activity:  Increase activity slowly as tolerated, but follow the weight bearing instructions below.  No lifting or driving for 6 weeks.  Weight Bearing:   Non-weightbearing to the right leg.  To prevent constipation: you may use a stool softener such as -  Colace (over the counter) 100 mg by mouth twice a day  Drink plenty of fluids (prune juice may be helpful) and high fiber foods Miralax (over the counter) for constipation as needed.    Itching:  If you experience itching with your medications, try taking only a single pain pill, or even half a pain pill at a time.  You may take up to 10 pain pills per day, and you can also use benadryl over the counter for itching or also to help with sleep.   Precautions:  If you experience chest pain or shortness of breath - call 911 immediately for transfer to the hospital emergency department!!  If you develop a fever greater that 101 F, purulent drainage from wound, increased redness or drainage from wound, or calf pain-Call [place]                                              Follow- Up Appointment:  Please call for an appointment to be seen in 2 weeks at [facility]

## 2021-01-22 NOTE — TOC Progression Note (Signed)
Transition of Care [facility]) - Progression Note    Patient Details  Name: Sandra Charles MRN: [id number] Date of Birth: 1955-12-07  Transition of Care [facility]) CM/SW [place], RN Phone Number: 01/22/2021, 10:21 AM  Clinical Narrative:    Met with the patient and her son in the room, Discussed DC plan again, She realizes going home is not the safe option, She is agreeable to a bedsearch to go to STR, PASSR obtained, FL2 completed, Bedsearch sent   Expected Discharge Plan: [place] Barriers to Discharge: Continued Medical Work up  Expected Discharge Plan and Services Expected Discharge Plan: [place] arrangements for the past 2 months: Single Family Home                 DME Arranged: 3-N-1, Walker rolling, Wheelchair manual DME Agency: [employer] Date DME Agency Contacted: 01/21/21   Representative spoke with at DME Agency: [place]: PT, OT, RN [facility] Agency: [place] Date [place]: 01/21/21   Representative spoke with at [place]: [place] Determinants of Health (SDOH) Interventions    Readmission Risk Interventions No flowsheet data found.

## 2021-01-22 NOTE — Progress Notes (Signed)
PROGRESS NOTE    Sandra Charles  JQG:[id number] DOB: Apr 09, 1955 DOA: 01/19/2021 PCP: Ashley Jacobs, MD   Chief Complaint  Patient presents with   Ankle Injury  Brief Narrative/Hospital Course: Sandra Charles, 65 y.o. female with PMH of seizure disorder with recurrent breakthrough seizure recent hospitalization in July 2022, and recently seen in ED on 11/18 for the same, anxiety/depression, chronic cannabis use, headaches, TIA brought to the ED after a fall after missing a step while going down and coming with a right ankle injury where x-ray showed "XRAY: Acute trimalleolar right ankle fracture with posterior dislocation of the talus relative to the tibia".  Chest x-ray no acute finding, CT head CT C-spine degenerative changes, chronic microvascular ischemic changes, in the ED hypertensive blood work, EKG are done, relatively stable orthopedic was consulted and admitted for further management. underwent ORIF of medial and lateral malleolar fractures on right ankle by Dr. Roland Rack 11/25  Subjective: Overnight afebrile blood pressure stable 130s to 160s. K low 3.0 Resting comfortably pain is controlled.  Assessment & Plan:  Trimalleolar fracture of ankle, closed, right after mechanical fall: s/p ORIF of medial and lateral malleolar fractures on right ankle by Dr. Roland Rack 11/25.  Continue pain control, DVT prophylaxis with Lovenox> on discharge switch to aspirin 325 per orthopedics.  Appreciate PT OT input recommending skilled nursing facility-PT to see again, patient is agreeable for SNF if qualifies.  Hypokalemia replete again.  Essential hypertension: Well-controlled on home Amlodipine propanolol , clonidine and IV meds prn.  Seizure disorder: With episode of recurrent seizures ED visits and hospitalization.  Stable continue home Lamictal clobazam and monitor  Anxiety/depression schizoaffective disorder: mood stable, cont sroquel and sertraline  History of TIA cont aspirin  Class I  Obesity:Patient's Body mass index is 31.94 kg/m. : Will benefit with PCP follow-up, weight loss  healthy lifestyle and outpatient sleep evaluation.  DVT prophylaxis: enoxaparin (LOVENOX) injection 40 mg Start: 01/21/21 0800 SCDs Start: 01/20/21 1652 chemical prophylaxis likely tomorrow await orthopedics input Code Status:   Code Status: Full Code Family Communication: plan of care discussed with patient at bedside. Status is: Admitted as observation Remains hospitalized for ongoing management of fracture and need for operative intervention. Disposition: Currently medically stable Anticipated Disposition: SNF  Objective: Vitals last 24 hrs: Vitals:   01/21/21 1955 01/22/21 0227 01/22/21 0535 01/22/21 0812  BP: (!) 160/69 (!) 132/53 (!) 152/66 129/69  Pulse: 86 72 76 60  Resp: 18 18 18 17   Temp: 99.5 F (37.5 C) 98.6 F (37 C)  (!) 97.5 F (36.4 C)  TempSrc:      SpO2: 98% 95%  98%  Weight:      Height:       Weight change:   Intake/Output Summary (Last 24 hours) at 01/22/2021 1029 Last data filed at 01/21/2021 2100 Gross per 24 hour  Intake 240 ml  Output --  Net 240 ml   Net IO Since Admission: 2,234.88 mL [01/22/21 1029]   Physical Examination: General exam: AAOx 3 older than stated age, weak appearing. HEENT:Oral mucosa moist, Ear/Nose WNL grossly, dentition normal. Respiratory system: bilaterally diminished, no use of accessory muscle Cardiovascular system: S1 & S2 +, No JVD,. Gastrointestinal system: Abdomen soft, NT,ND, BS+ Nervous System:Alert, awake, moving extremities and grossly nonfocal Extremities: no edema, right foot with dressing in place distal peripheral pulses palpable.  Skin: No rashes,no icterus. MSK: Normal muscle bulk,tone, power   Medications reviewed:  Scheduled Meds:  amLODipine  10 mg Oral Daily  aspirin EC  81 mg Oral BH-q7a   cholecalciferol  5,000 Units Oral BH-q7a   cloBAZam  5 mg Oral QHS   cloNIDine  0.1 mg Oral BID   docusate  sodium  100 mg Oral BID   donepezil  10 mg Oral QHS   enoxaparin (LOVENOX) injection  40 mg Subcutaneous Q24H   feeding supplement  237 mL Oral BID BM   hydrALAZINE  25 mg Oral Q8H   lamoTRIgine  100 mg Oral BID   lamoTRIgine  25 mg Oral QPM   multivitamin with minerals  1 tablet Oral Daily   pantoprazole  40 mg Oral Daily   potassium chloride  40 mEq Oral Daily   propranolol ER  60 mg Oral Daily   QUEtiapine  50 mg Oral QHS   rosuvastatin  20 mg Oral QHS   sertraline  100 mg Oral Daily   vitamin B-12  1,000 mcg Oral Daily   Continuous Infusions:  sodium chloride 75 mL/hr at 01/22/21 0103   Diet Order             Diet regular Room service appropriate? Yes; Fluid consistency: Thin  Diet effective now                   Weight change:   Wt Readings from Last 3 Encounters:  01/19/21 79.2 kg  01/13/21 79.2 kg  08/31/20 89 kg  Consultants:see note  Procedures:see note Antimicrobials: Anti-infectives (From admission, onward)    Start     Dose/Rate Route Frequency Ordered Stop   01/20/21 1800  ceFAZolin (ANCEF) IVPB 2g/100 mL premix        2 g 200 mL/hr over 30 Minutes Intravenous Every 6 hours 01/20/21 1651 01/21/21 0709   01/20/21 1150  ceFAZolin (ANCEF) 2-4 GM/100ML-% IVPB       Note to Pharmacy: [place]  : cabinet override      01/20/21 1150 01/20/21 1315   01/20/21 0000  ceFAZolin (ANCEF) IVPB 2g/100 mL premix        2 g 200 mL/hr over 30 Minutes Intravenous 30 min pre-op 01/19/21 1746 01/20/21 1336      Culture/Microbiology    Component Value Date/Time   SDES BLOOD RIGHT HAND 08/26/2020 0255   SPECREQUEST AEROBIC BOTTLE ONLY Blood Culture adequate volume 08/26/2020 0255   CULT  08/26/2020 0255    NO GROWTH 5 DAYS Performed at [facility], [place] [address], [place],  [address]    REPTSTATUS 08/31/2020 FINAL 08/26/2020 0255    Other culture-see note  Unresulted Labs (From admission, onward)    None      Data Reviewed: I have  personally reviewed following labs and imaging studies CBC: Recent Labs  Lab 01/20/21 0603 01/21/21 0457  WBC 8.9 9.5  NEUTROABS 6.8  --   HGB 11.2* 10.0*  HCT 37.4 33.9*  MCV 73.5* 73.4*  PLT 241 962   Basic Metabolic Panel: Recent Labs  Lab 01/20/21 0603 01/21/21 0457 01/22/21 0722  NA 138 137  --   K 3.5 3.0*  --   CL 102 105  --   CO2 27 25  --   GLUCOSE 123* 96  --   BUN 11 12  --   CREATININE 0.74 0.71  --   CALCIUM 9.2 9.0  --   MG  --   --  2.2   GFR: Estimated Creatinine Clearance: 70.1 mL/min (by C-G formula based on SCr of 0.71 mg/dL). Liver Function  Tests: Recent Labs  Lab 01/20/21 0603  AST 38  ALT 51*  ALKPHOS 105  BILITOT 0.7  PROT 7.9  ALBUMIN 4.0   No results for input(s): LIPASE, AMYLASE in the last 168 hours. No results for input(s): AMMONIA in the last 168 hours. Coagulation Profile: No results for input(s): INR, PROTIME in the last 168 hours. Cardiac Enzymes: No results for input(s): CKTOTAL, CKMB, CKMBINDEX, TROPONINI in the last 168 hours. BNP (last 3 results) No results for input(s): PROBNP in the last 8760 hours. HbA1C: No results for input(s): HGBA1C in the last 72 hours. CBG: No results for input(s): GLUCAP in the last 168 hours. Lipid Profile: No results for input(s): CHOL, HDL, LDLCALC, TRIG, CHOLHDL, LDLDIRECT in the last 72 hours. Thyroid Function Tests: No results for input(s): TSH, T4TOTAL, FREET4, T3FREE, THYROIDAB in the last 72 hours. Anemia Panel: No results for input(s): VITAMINB12, FOLATE, FERRITIN, TIBC, IRON, RETICCTPCT in the last 72 hours. Sepsis Labs: No results for input(s): PROCALCITON, LATICACIDVEN in the last 168 hours.  Recent Results (from the past 240 hour(s))  Resp Panel by RT-PCR (Flu A&B, Covid) Nasopharyngeal Swab     Status: None   Collection Time: 01/20/21  9:40 AM   Specimen: Nasopharyngeal Swab; Nasopharyngeal(NP) swabs in vial transport medium  Result Value Ref Range Status   SARS  Coronavirus 2 by RT PCR NEGATIVE NEGATIVE Final    Comment: (NOTE) SARS-CoV-2 target nucleic acids are NOT DETECTED.  The SARS-CoV-2 RNA is generally detectable in upper respiratory specimens during the acute phase of infection. The lowest concentration of SARS-CoV-2 viral copies this assay can detect is 138 copies/mL. A negative result does not preclude SARS-Cov-2 infection and should not be used as the sole basis for treatment or other patient management decisions. A negative result may occur with  improper specimen collection/handling, submission of specimen other than nasopharyngeal swab, presence of viral mutation(s) within the areas targeted by this assay, and inadequate number of viral copies(<138 copies/mL). A negative result must be combined with clinical observations, patient history, and epidemiological information. The expected result is Negative.  Fact Sheet for Patients:  EntrepreneurPulse.com.au  Fact Sheet for Healthcare Providers:  IncredibleEmployment.be  This test is no t yet approved or cleared by the [place] FDA and  has been authorized for detection and/or diagnosis of SARS-CoV-2 by FDA under an Emergency Use Authorization (EUA). This EUA will remain  in effect (meaning this test can be used) for the duration of the COVID-19 declaration under Section 564(b)(1) of the Act, 21 U.S.C.section 360bbb-3(b)(1), unless the authorization is terminated  or revoked sooner.       Influenza A by PCR NEGATIVE NEGATIVE Final   Influenza B by PCR NEGATIVE NEGATIVE Final    Comment: (NOTE) The Xpert Xpress SARS-CoV-2/FLU/RSV plus assay is intended as an aid in the diagnosis of influenza from Nasopharyngeal swab specimens and should not be used as a sole basis for treatment. Nasal washings and aspirates are unacceptable for Xpert Xpress SARS-CoV-2/FLU/RSV testing.  Fact Sheet for  Patients: EntrepreneurPulse.com.au  Fact Sheet for Healthcare Providers: IncredibleEmployment.be  This test is not yet approved or cleared by the [place] FDA and has been authorized for detection and/or diagnosis of SARS-CoV-2 by FDA under an Emergency Use Authorization (EUA). This EUA will remain in effect (meaning this test can be used) for the duration of the COVID-19 declaration under Section 564(b)(1) of the Act, 21 U.S.C. section 360bbb-3(b)(1), unless the authorization is terminated or revoked.  Performed at [employer]  [facility], [address] [place], [place] [address]      Radiology Studies: DG Ankle 2 Views Right  Result Date: 01/20/2021 CLINICAL DATA:  Trimalleolar fracture RIGHT ankle, closed, ORIF EXAM: RIGHT ANKLE - 2 VIEW COMPARISON:  01/19/2021 FLUOROSCOPY TIME:  0 minutes 43.9 seconds Images: 2 FINDINGS: Two cannulated screws placed across a reduced medial malleolar fracture. Lateral plate and multiple screws placed across a reduced lateral malleolar fracture. Again identified posterior malleolar fracture fragment. Joint spaces preserved. No additional fracture or bone destruction seen. IMPRESSION: Post ORIF of medial and lateral malleolar fractures. Again identified posterior malleolar fragment. Electronically Signed   By: Lavonia Dana M.D.   On: 01/20/2021 15:02   DG C-Arm 1-60 Min-No Report  Result Date: 01/20/2021 Fluoroscopy was utilized by the requesting physician.  No radiographic interpretation.   [place] OR NERVE BLOCK-IMAGE ONLY [facility])  Result Date: 01/20/2021 There is no interpretation for this exam.  This order is for images obtained during a surgical procedure.  Please See "Surgeries" Tab for more information regarding the procedure.   DG MINI C-ARM IMAGE ONLY  Result Date: 01/20/2021 There is no interpretation for this exam.  This order is for images obtained during a surgical procedure.  Please See  "Surgeries" Tab for more information regarding the procedure.     LOS: 0 days   Antonieta Pert, MD [facility]  01/22/2021, 10:29 AM

## 2021-01-22 NOTE — TOC Progression Note (Signed)
Transition of Care [facility]) - Progression Note    Patient Details  Name: Sandra Charles MRN: [id number] Date of Birth: May 29, 1955  Transition of Care [facility]) CM/SW [place], RN Phone Number: 01/22/2021, 8:44 AM  Clinical Narrative:    Met with the patient in the room, She continues to refuse to go to [facility] SNF and states that she has her fiance to help her at Home, I asked her how she would manage at home as she is not doing well with PT here, She stated that she would do ok with her fiance's help 24/7, I reached out to [facility] with [employer] to inquire when the DME WC and RW would be delivered, Awaiting a call back from [place]   Expected Discharge Plan: [place] Barriers to Discharge: Continued Medical Work up  Expected Discharge Plan and Services Expected Discharge Plan: [place] arrangements for the past 2 months: Single Family Home                 DME Arranged: 3-N-1, Walker rolling, Wheelchair manual DME Agency: [employer] Date DME Agency Contacted: 01/21/21   Representative spoke with at DME Agency: [place]: PT, OT, RN [facility] Agency: [place] Date [place]: 01/21/21   Representative spoke with at [place]: [place] (SDOH) Interventions    Readmission Risk Interventions No flowsheet data found.

## 2021-01-22 NOTE — Progress Notes (Signed)
Patient suffers from ankle fracture non weight bearing, which impairs their ability to perform daily activities like ADLs in the home.  A walking aide  will not resolve issue with performing activities of daily living. A wheelchair will allow patient to safely perform daily activities. Patient is not able to propel themselves in the home using a standard weight wheelchair due to weakness. Patient can self propel in the lightweight wheelchair. Length of need 6 months. Accessories: elevating leg rests (ELRs), wheel locks, extensions and anti-tippers. Back Cushion

## 2021-01-22 NOTE — Progress Notes (Signed)
Physical Therapy Treatment Patient Details Name: Sandra Charles MRN: [id number] DOB: [birth year] Today's Date: 01/22/2021   History of Present Illness 65 y/o female s/p fall with L ankle fx and now [place] s/p ORIF.    PT Comments    Pt continued to struggled with PT this AM. She did relatively well on eval yesterday AM but did poorly with the afternoon session, this trend continued today and she struggled with even basic tasks and could not really manage any walking, struggled much more with mobility than she had been and generally was very low energy and despite much encouragement from PT and son, as well as plenty of extra cuing and time she was unable to show ability to safety hop/step away from bed as she was leaning back, struggled to sustain activity for more than a few seconds and generally was simply very tired and sleepy (despite session being late morning).  Pt and family had hoped to be able to d/c to home but this is certainly not a safe option given her last 2 PT sessions, realistic recommendations can only be rehab at this point.    Recommendations for follow up therapy are one component of a multi-disciplinary discharge planning process, led by the attending physician.  Recommendations may be updated based on patient status, additional functional criteria and insurance authorization.  Follow Up Recommendations  Skilled nursing-short term rehab (<3 hours/day)     Assistance Recommended at Discharge Frequent or constant Supervision/Assistance  Equipment Recommendations   (TBD at next venue of care)    Recommendations for Other Services       Precautions / Restrictions Precautions Precautions: Fall Restrictions Weight Bearing Restrictions: Yes RLE Weight Bearing: Non weight bearing     Mobility  Bed Mobility Overal bed mobility: Needs Assistance Bed Mobility: Sit to Supine;Supine to Sit     Supine to sit: Min assist Sit to supine: Min assist   General bed mobility  comments: pt very slow and labored with mobility today, struggled to coordinate using U&LEs and even with repeated cuing at times could not initiate movement very well at all    Transfers Overall transfer level: Needs assistance Equipment used: Rolling walker (2 wheels) Transfers: Sit to/from Stand Sit to Stand: Min assist;Mod assist           General transfer comment: 3 seperate standing bouts along with multiple unsuccessful attempts at standing.  Needed excessive and repeated cues for hand placement, body positioning, reinforcing NWBing on R, sequencing, etc.  Pt did not need a lot of physical assist to rise (only CGA on elevated surface attempt) but pt generally leaning back, strulggled to effectively use/place UEs and overall showed poor safety awareness and ability to motivate herself with mobility    Ambulation/Gait               General Gait Details: Pt could not keep weight over the walker well enough to make ambulation away from the bed a viable/safe option.  She did manage a few small, very laborious side hop steps along EOB but was quick to fatigue with this.  On 3rd effort with attempts with EOB mobility she could not clear L foot but did manage to do some heel-toe side shuffling.   Stairs             Wheelchair Mobility    Modified Rankin (Stroke Patients Only)       Balance Overall balance assessment: Needs assistance Sitting-balance support: Bilateral upper extremity supported Sitting  balance-Leahy Scale: Fair     Standing balance support: Bilateral upper extremity supported Standing balance-Leahy Scale: Poor Standing balance comment: poor awareness, execution and general safety with standing mobility this session.  Pt leaning back much of the time and unsafe to trial much standing activity away from EOB                            Cognition Arousal/Alertness: Lethargic Behavior During Therapy: Flat affect Overall Cognitive Status:  Within Functional Limits for tasks assessed                                 General Comments: Pt again needing increased cuing and encouragement - son present and helpful but ultimately pt was limited and "off" this AM        Exercises General Exercises - Lower Extremity Ankle Circles/Pumps: AROM;10 reps (R in splint) Heel Slides: AAROM;10 reps Hip ABduction/ADduction: AAROM;10 reps Straight Leg Raises: AAROM;10 reps (very much struggled to lift R LE against gravity this session)    General Comments General comments (skin integrity, edema, etc.): Pt lethargic and just "didn't have the stuff" this session.  Son present and very encouraging and pt clearly wanting to do more but simply struggled with all tasks.      Pertinent Vitals/Pain Pain Assessment: 0-10 Pain Score: 8  Pain Location: R ankle    Home Living                          Prior Function            PT Goals (current goals can now be found in the care plan section) Progress towards PT goals: Not progressing toward goals - comment (pt has done progressively worse with each PT session, low energy, struggling to follow cues, poor awareness)    Frequency    BID      PT Plan Current plan remains appropriate    Co-evaluation              AM-PAC PT "6 Clicks" Mobility   Outcome Measure  Help needed turning from your back to your side while in a flat bed without using bedrails?: A Little Help needed moving from lying on your back to sitting on the side of a flat bed without using bedrails?: A Lot Help needed moving to and from a bed to a chair (including a wheelchair)?: A Lot Help needed standing up from a chair using your arms (e.g., wheelchair or bedside chair)?: A Lot Help needed to walk in hospital room?: Total Help needed climbing 3-5 steps with a railing? : Total 6 Click Score: 11    End of Session Equipment Utilized During Treatment: Gait belt Activity Tolerance: Patient  limited by fatigue;Patient limited by lethargy Patient left: with bed alarm set;with call bell/phone within reach;with family/visitor present   PT Visit Diagnosis: Muscle weakness (generalized) (M62.81);Difficulty in walking, not elsewhere classified (R26.2);Pain Pain - Right/Left: Right Pain - part of body: Ankle and joints of foot     Time: 8250-5397 PT Time Calculation (min) (ACUTE ONLY): 41 min  Charges:  $Therapeutic Exercise: 8-22 mins $Therapeutic Activity: 23-37 mins                     Kreg Shropshire, DPT 01/22/2021, 1:12 PM

## 2021-01-22 NOTE — NC FL2 (Signed)
[place] LEVEL OF CARE SCREENING TOOL     IDENTIFICATION  Patient Name: Sandra Charles Birthdate: Mar 29, 1955 Sex: female Admission Date (Current Location): 01/19/2021  [facility] and [place] Number:  Engineering geologist and Address:  [facility], [address] [place],  [address]      Provider Number: [id number]  Attending Physician Name and Address:  Antonieta Pert, MD  Relative Name and Phone Number:  Harriette Ohara [phone]    Current Level of Care: Hospital Recommended Level of Care: [place] Prior Approval Number:    Date Approved/Denied:   PASRR Number: [id number]  Discharge Plan: SNF    Current Diagnoses: Patient Active Problem List   Diagnosis Date Noted   Trimalleolar fracture of ankle, closed, right, initial encounter 01/19/2021   Accidental fall 01/19/2021   Preoperative clearance 01/19/2021   Seizures ([place]) 08/26/2020   Respiratory failure with hypoxia ([place])    Aspiration into airway    Acute pulmonary edema (HCC)    Tetrahydrocannabinol (THC) dependence ([place])    Sepsis ([place]) 09/22/2018   Altered mental status 03/27/2018   HLD (hyperlipidemia) 10/05/2017   Seizure ([place])    UTI (urinary tract infection) 08/17/2015   Seizure disorder ([place]) 08/10/2015   Cannabis use disorder, moderate, dependence ([place]) 08/10/2015   Schizoaffective disorder ([place]) 08/10/2015   Abnormal finding on MRI of brain    Hypokalemia 07/15/2015   Confusion    GERD (gastroesophageal reflux disease) 11/25/2014   Essential hypertension 11/25/2014    Orientation RESPIRATION BLADDER Height & Weight     Self, Time, Situation, Place  Normal (room air) Continent, External catheter Weight: 79.2 kg Height:  5\' 2"  (157.5 cm)  BEHAVIORAL SYMPTOMS/MOOD NEUROLOGICAL BOWEL NUTRITION STATUS      Continent Diet (see DC summary)  AMBULATORY STATUS COMMUNICATION OF NEEDS Skin   Extensive Assist Verbally Normal, Surgical wounds                        Personal Care Assistance Level of Assistance  Dressing, Feeding, Bathing Bathing Assistance: Limited assistance Feeding assistance: Independent Dressing Assistance: Maximum assistance     Functional Limitations Info             SPECIAL CARE FACTORS FREQUENCY  PT (By licensed PT)     PT Frequency: 5 times per week              Contractures Contractures Info: Not present    Additional Factors Info  Code Status, Allergies Code Status Info: Full code Allergies Info: Lovastatin           Current Medications (01/22/2021):  This is the current hospital active medication list Current Facility-Administered Medications  Medication Dose Route Frequency Provider Last Rate Last Admin   0.9 %  sodium chloride infusion   Intravenous Continuous Poggi, Marshall Cork, MD 75 mL/hr at 01/22/21 0103 New Bag at 01/22/21 0103   acetaminophen (TYLENOL) tablet 325-650 mg  325-650 mg Oral Q6H PRN Poggi, Marshall Cork, MD       amLODipine (NORVASC) tablet 10 mg  10 mg Oral Daily Poggi, Marshall Cork, MD   10 mg at 01/22/21 7673   aspirin EC tablet 81 mg  81 mg Oral Ellison Hughs, MD   81 mg at 01/22/21 4193   bisacodyl (DULCOLAX) suppository 10 mg  10 mg Rectal Daily PRN Poggi, Marshall Cork, MD       cholecalciferol (VITAMIN D3) tablet 5,000 Units  5,000 Units Oral Ellison Hughs, MD   5,000 Units at 01/22/21 1194   cloBAZam (ONFI) tablet 5 mg  5 mg Oral QHS Poggi, Marshall Cork, MD   5 mg at 01/21/21 2239   cloNIDine (CATAPRES) tablet 0.1 mg  0.1 mg Oral BID Corky Mull, MD   0.1 mg at 01/22/21 1740   diphenhydrAMINE (BENADRYL) 12.5 MG/5ML elixir 12.5-25 mg  12.5-25 mg Oral Q4H PRN Poggi, Marshall Cork, MD       docusate sodium (COLACE) capsule 100 mg  100 mg Oral BID Corky Mull, MD   100 mg at 01/22/21 0958   donepezil (ARICEPT) tablet 10 mg  10 mg Oral QHS Poggi, Marshall Cork, MD   10 mg at 01/21/21 2240   enoxaparin (LOVENOX) injection 40 mg  40 mg Subcutaneous Q24H Poggi, Marshall Cork, MD   40 mg at  01/22/21 8144   feeding supplement (ENSURE ENLIVE / ENSURE PLUS) liquid 237 mL  237 mL Oral BID BM Poggi, Marshall Cork, MD   237 mL at 01/21/21 1451   hydrALAZINE (APRESOLINE) tablet 25 mg  25 mg Oral Q8H Poggi, Marshall Cork, MD   25 mg at 01/22/21 0537   HYDROcodone-acetaminophen (NORCO/VICODIN) 5-325 MG per tablet 1-2 tablet  1-2 tablet Oral Q6H PRN Poggi, Marshall Cork, MD   2 tablet at 01/22/21 0537   HYDROmorphone (DILAUDID) injection 0.25-0.5 mg  0.25-0.5 mg Intravenous Q2H PRN Poggi, Marshall Cork, MD       lamoTRIgine (LAMICTAL) tablet 100 mg  100 mg Oral BID Corky Mull, MD   100 mg at 01/22/21 8185   lamoTRIgine (LAMICTAL) tablet 25 mg  25 mg Oral QPM Poggi, Marshall Cork, MD   25 mg at 01/21/21 2200   magnesium hydroxide (MILK OF MAGNESIA) suspension 30 mL  30 mL Oral Daily PRN Poggi, Marshall Cork, MD       metoCLOPramide (REGLAN) tablet 5-10 mg  5-10 mg Oral Q8H PRN Poggi, Marshall Cork, MD       Or   metoCLOPramide (REGLAN) injection 5-10 mg  5-10 mg Intravenous Q8H PRN Poggi, Marshall Cork, MD       multivitamin with minerals tablet 1 tablet  1 tablet Oral Daily Poggi, Marshall Cork, MD   1 tablet at 01/22/21 0959   ondansetron (ZOFRAN) tablet 4 mg  4 mg Oral Q6H PRN Poggi, Marshall Cork, MD       Or   ondansetron (ZOFRAN) injection 4 mg  4 mg Intravenous Q6H PRN Poggi, Marshall Cork, MD       pantoprazole (PROTONIX) EC tablet 40 mg  40 mg Oral Daily Poggi, Marshall Cork, MD   40 mg at 01/22/21 6314   potassium chloride SA (KLOR-CON) CR tablet 40 mEq  40 mEq Oral Daily Kc, Ramesh, MD       propranolol ER (INDERAL LA) 24 hr capsule 60 mg  60 mg Oral Daily Kc, Ramesh, MD   60 mg at 01/22/21 0959   QUEtiapine (SEROQUEL XR) 24 hr tablet 50 mg  50 mg Oral QHS Poggi, Marshall Cork, MD   50 mg at 01/21/21 2241   rosuvastatin (CRESTOR) tablet 20 mg  20 mg Oral QHS Kc, Maren [place], MD   20 mg at 01/21/21 2241   senna-docusate (Senokot-S) tablet 1 tablet  1 tablet Oral QHS PRN Poggi, Marshall Cork, MD       sertraline (ZOLOFT) tablet 100 mg  100 mg Oral Daily Poggi, Marshall Cork, MD   100 mg  at 01/22/21 0959   sodium phosphate (FLEET) 7-19 GM/118ML enema 1 enema  1 enema Rectal Once PRN Poggi, Marshall Cork, MD       vitamin B-12 (CYANOCOBALAMIN) tablet 1,000 mcg  1,000 mcg Oral Daily Poggi, Marshall Cork, MD   1,000 mcg at 01/22/21 0959   zolpidem (AMBIEN) tablet 5 mg  5 mg Oral QHS PRN Poggi, Marshall Cork, MD         Discharge Medications: Please see discharge summary for a list of discharge medications.  Relevant Imaging Results:  Relevant Lab Results:   Additional Information SS# [id number]  Conception Oms, RN

## 2021-01-23 ENCOUNTER — Encounter: Payer: Self-pay | Admitting: Surgery

## 2021-01-23 DIAGNOSIS — S82851A Displaced trimalleolar fracture of right lower leg, initial encounter for closed fracture: Secondary | ICD-10-CM | POA: Diagnosis not present

## 2021-01-23 LAB — BASIC METABOLIC PANEL
Anion gap: 6 (ref 5–15)
BUN: 15 mg/dL (ref 8–23)
CO2: 26 mmol/L (ref 22–32)
Calcium: 9.1 mg/dL (ref 8.9–10.3)
Chloride: 106 mmol/L (ref 98–111)
Creatinine, Ser: 0.65 mg/dL (ref 0.44–1.00)
GFR, Estimated: >60 mL/min (ref >60)
Glucose, Bld: 94 mg/dL (ref 70–99)
Potassium: 4.1 mmol/L (ref 3.5–5.1)
Sodium: 138 mmol/L (ref 135–145)

## 2021-01-23 NOTE — TOC Progression Note (Addendum)
Transition of Care [facility]) - Progression Note    Patient Details  Name: Sandra Charles MRN: [id number] Date of Birth: 11/23/1955  Transition of Care [facility]) CM/SW [place], RN Phone Number: 01/23/2021, 3:32 PM  Clinical Narrative:   Patient has no bed offers today.  She has been experiencing confusion all day, and states she is not sure about transferring to SNF at discharge.  Message left for significant other, awaiting return call.  TOC to follow.  Patient's significant other is in emergency room at this time.  Please contact [name] or patient's niece for any needs.    Niece would like to speak with other case manager and son prior to making decisions.  Expected Discharge Plan: [place] Barriers to Discharge: Continued Medical Work up  Expected Discharge Plan and Services Expected Discharge Plan: [place] arrangements for the past 2 months: Single Family Home                 DME Arranged: 3-N-1, Walker rolling, Wheelchair manual DME Agency: [employer] Date DME Agency Contacted: 01/21/21   Representative spoke with at DME Agency: [place]: PT, OT, RN [place] Agency: [place] Date [place]: 01/21/21   Representative spoke with at [place]: [place] ([place]) Interventions    Readmission Risk Interventions No flowsheet data found.

## 2021-01-23 NOTE — Progress Notes (Signed)
Subjective: 3 Days Post-Op Procedure(s) (LRB): OPEN REDUCTION INTERNAL FIXATION (ORIF) ANKLE FRACTURE (Right) Patient is sleeping well without any signs of pain.  Patient is well, patient denies significant pain in the right ankle. Plan is for d/c to SNF at this time. Negative for chest pain and shortness of breath Fever: no Gastrointestinal:Negative for nausea and vomiting Patient is passing gas without pain this AM.  Has not had a BM yet.  Objective: Vital signs in last 24 hours: Temp:  [97.5 F (36.4 C)-98.3 F (36.8 C)] 98.3 F (36.8 C) (11/28 0359) Pulse Rate:  [58-83] 65 (11/28 0359) Resp:  [16-19] 16 (11/28 0359) BP: (125-150)/(61-70) 125/61 (11/28 0359) SpO2:  [95 %-99 %] 99 % (11/28 0359)  Intake/Output from previous day:  Intake/Output Summary (Last 24 hours) at 01/23/2021 0714 Last data filed at 01/23/2021 0000 Gross per 24 hour  Intake 240 ml  Output --  Net 240 ml    Intake/Output this shift: No intake/output data recorded.  Labs: Recent Labs    01/21/21 0457  HGB 10.0*   Recent Labs    01/21/21 0457  WBC 9.5  RBC 4.62  HCT 33.9*  PLT 205   Recent Labs    01/21/21 0457 01/23/21 0630  NA 137 138  K 3.0* 4.1  CL 105 106  CO2 25 26  BUN 12 15  CREATININE 0.71 0.65  GLUCOSE 96 94  CALCIUM 9.0 9.1   No results for input(s): LABPT, INR in the last 72 hours.   EXAM General - Patient is Alert and Appropriate Extremity - ABD soft Short leg splint intact to the right leg. No evidence of loosening.  Able to flex and extend toes on command. Cap refill intact to each toe. Intact to light touch over the dorsal and volar aspect of exposed foot and proximal aspect of the leg.  Past Medical History:  Diagnosis Date   Anxiety    Arthritis    "all over"   Chronic lower back pain    Depression    GERD (gastroesophageal reflux disease)    Headache    "weekly" (07/15/2015)   Hyperlipidemia    Hypertension    Mini stroke    "several since  05/2014" (07/15/2015)   Seizures ([place]) dx'd 04/2015    Assessment/Plan: 3 Days Post-Op Procedure(s) (LRB): OPEN REDUCTION INTERNAL FIXATION (ORIF) ANKLE FRACTURE (Right) Principal Problem:   Trimalleolar fracture of ankle, closed, right, initial encounter Active Problems:   Essential hypertension   Seizure disorder (HCC)   Schizoaffective disorder ([place])   Accidental fall   Preoperative clearance  Estimated body mass index is 31.94 kg/m as calculated from the following:   Height as of this encounter: 5\' 2"  (1.575 m).   Weight as of this encounter: 79.2 kg. Advance diet Up with therapy D/C IV fluids when tolerating po intake.  Vitals reviewed this AM. Patient lives with fiance but does have several steps to go up and down. Patient was not able to progress with therapy as planned.  Plan is for d/c to SNF. Patient can d/c to SNF today pending bed approval.  Start on Aspirin 325mg  daily on discharge. Follow-up with [facility] orthopaedics in 10-14 days for splint removal and x-rays.  DVT Prophylaxis - Lovenox Non-weightbearing to the right leg.  Raquel Nyeema Want, PA-C [facility]opa[facility] 01/23/2021, 7:14 AM

## 2021-01-23 NOTE — Progress Notes (Signed)
Physical Therapy Treatment Patient Details Name: Sandra Charles MRN: [id number] DOB: 07/24/1955 Today's Date: 01/23/2021   History of Present Illness Destiney Sanabia is a 69yoF who comes to [facility] on s/p fall with L ankle fx and now s/p ORIF and NWB.    PT Comments    Pt asleep upon entry, easily made awake and interactive. Pt appears cognitively intact most of time, but throughout session, more detailed conversation revealing of decreased problem solving, decreased awareness, decreased orientation, and decreased short term memory recall. Pt struggles to follow commands for basic exercises, but with additional cuing can perform LLE exercises, however cannot attend to reps despite cues. Pt has severe weakness or RLE/pain inhibition which limits any appreciable movement of the limb. From available information, cognition appears to continue to decline somewhat, which is not consistent with patient's baseline Will continue to follow.   Recommendations for follow up therapy are one component of a multi-disciplinary discharge planning process, led by the attending physician.  Recommendations may be updated based on patient status, additional functional criteria and insurance authorization.  Follow Up Recommendations  Skilled nursing-short term rehab (<3 hours/day)     Assistance Recommended at Discharge Frequent or constant Supervision/Assistance  Equipment Recommendations       Recommendations for Other Services       Precautions / Restrictions Precautions Precautions: Fall Splint/Cast - Date Prophylactic Dressing Applied (if applicable): 02/40/97 Restrictions Weight Bearing Restrictions: Yes RLE Weight Bearing: Non weight bearing     Mobility  Bed Mobility Overal bed mobility:  (deferred due to difficulty with cognition and following commands.)                  Transfers                        Ambulation/Gait                   Stairs              Wheelchair Mobility    Modified Rankin (Stroke Patients Only)       Balance                                            Cognition Arousal/Alertness: Awake/alert   Overall Cognitive Status: No family/caregiver present to determine baseline cognitive functioning Area of Impairment: Attention;Following commands;Memory;Awareness                     Memory: Decreased short-term memory;Decreased recall of precautions (asked multiple times to described WB status, pt repeatedly gives the number "8" as a response, unable to further explain when asked; tells Pryor Curia what she had for breakfast at begging of session, then is unable to recall at end of session.)     Awareness: Anticipatory Artist is lowering bed at end of session, pt remakrs twice "stop- that's high enough")   General Comments: at end of session, unable to say whether she is expecting breakfat, lunch, or dinner, despite discussing her breasfast earlier in session        Exercises Total Joint Exercises Ankle Circles/Pumps: PROM General Exercises - Lower Extremity Short Arc Quad: AAROM;AROM;Both;Supine;Limitations Short Arc Quad Limitations: severe pain mediated inhibition on Rt, requires maxA from Chief Strategy Officer. Heel Slides: AROM;Left;5 reps Hip ABduction/ADduction: AAROM;10 reps;Supine;Both;AROM;Limitations Hip Abduction/Adduction Limitations: severe pain mediated inhibition on Rt, requires maxA  from Chief Strategy Officer. Straight Leg Raises: AAROM;10 reps;AROM;Supine;Both;Limitations Straight Leg Raises Limitations: severe pain mediated inhibition on Rt, requires maxA from Chief Strategy Officer.    General Comments        Pertinent Vitals/Pain Pain Assessment: No/denies pain Pain Score: 4  Pain Location: Rt lower leg    Home Living                          Prior Function            PT Goals (current goals can now be found in the care plan section) Acute Rehab PT Goals Patient Stated Goal: go home PT  Goal Formulation: With patient Time For Goal Achievement: 02/04/21 Potential to Achieve Goals: Fair Progress towards PT goals: Not progressing toward goals - comment (mentation continues to worsen each session, remains a large barrier for optimizing response to rehab process)    Frequency    BID      PT Plan Current plan remains appropriate    Co-evaluation              AM-PAC PT "6 Clicks" Mobility   Outcome Measure  Help needed turning from your back to your side while in a flat bed without using bedrails?: A Lot Help needed moving from lying on your back to sitting on the side of a flat bed without using bedrails?: A Lot Help needed moving to and from a bed to a chair (including a wheelchair)?: Total Help needed standing up from a chair using your arms (e.g., wheelchair or bedside chair)?: Total Help needed to walk in hospital room?: Total Help needed climbing 3-5 steps with a railing? : Total 6 Click Score: 8    End of Session   Activity Tolerance: Patient tolerated treatment well;Patient limited by pain;Other (comment) (cognition) Patient left: with call bell/phone within reach;with bed alarm set;with SCD's reapplied;in bed   PT Visit Diagnosis: Muscle weakness (generalized) (M62.81);Difficulty in walking, not elsewhere classified (R26.2);Pain Pain - Right/Left: Right Pain - part of body: Ankle and joints of foot     Time: 3825-0539 PT Time Calculation (min) (ACUTE ONLY): 23 min  Charges:  $Therapeutic Exercise: 8-22 mins $Therapeutic Activity: 8-22 mins               12:29 PM, 01/23/21 Etta Grandchild, PT, DPT Physical Therapist - [facility]  [phone] ([place])     Deedee Lybarger C 01/23/2021, 12:26 PM

## 2021-01-23 NOTE — Progress Notes (Signed)
PROGRESS NOTE    Sandra Charles  VZC:[id number] DOB: [birth year] DOA: 01/19/2021 PCP: Ashley Jacobs, MD   Chief Complaint  Patient presents with   Ankle Injury  Brief Narrative/Hospital Course: Sandra Charles, 65 y.o. female with PMH of seizure disorder with recurrent breakthrough seizure recent hospitalization in July 2022, and recently seen in ED on 11/18 for the same, anxiety/depression, chronic cannabis use, headaches, TIA brought to the ED after a fall after missing a step while going down and coming with a right ankle injury where x-ray showed "XRAY: Acute trimalleolar right ankle fracture with posterior dislocation of the talus relative to the tibia".  Chest x-ray no acute finding, CT head CT C-spine degenerative changes, chronic microvascular ischemic changes, in the ED hypertensive blood work, EKG are done, relatively stable orthopedic was consulted and admitted for further management. underwent ORIF of medial and lateral malleolar fractures on right ankle by Dr. Roland Rack 11/25  Subjective: Seen and examined this morning.  Resting comfortably does have pain on the foot, otherwise no new complaints.  Awaiting for skilled nursing facility placement.   Labs improved Assessment & Plan:  Trimalleolar fracture of ankle, closed, right after mechanical fall: s/p ORIF of medial and lateral malleolar fractures on right ankle by Dr. Roland Rack 11/25.  Continue pain control, DVT prophylaxis with Lovenox> on discharge switch to aspirin 325 per orthopedics.  Appreciate PT OT input recommending skilled nursing facility-and awaiting for placement.  TOC working for bed offers.    Hypokalemia resolved.  Essential hypertension: Well-controlled on home Amlodipine propanolol , clonidine and IV meds prn.  Seizure disorder: She has had episodes of recurrent seizures ED visits and hospitalization.  Stable currently continue home Lamictal clobazam and monitor  Anxiety/depression schizoaffective disorder: mood stable,  cont sroquel and sertraline  History of TIA continue on aspirin  Class I Obesity:Patient's Body mass index is 31.94 kg/m. : Will benefit with PCP follow-up, weight loss  healthy lifestyle and outpatient sleep evaluation.  DVT prophylaxis: enoxaparin (LOVENOX) injection 40 mg Start: 01/21/21 0800 SCDs Start: 01/20/21 1652  Code Status:   Code Status: Full Code Family Communication: plan of care discussed with patient at bedside. Status is: Admitted as observation Remains hospitalized for ongoing management of fracture and need for operative intervention. Disposition: Currently medically stable Anticipated Disposition: SNF  Objective: Vitals last 24 hrs: Vitals:   01/22/21 1613 01/22/21 2100 01/23/21 0359 01/23/21 0746  BP: 130/62 (!) 150/65 125/61 (!) 142/57  Pulse: (!) 58 71 65 64  Resp: 17 17 16 16   Temp: 97.9 F (36.6 C)  98.3 F (36.8 C) 98.2 F (36.8 C)  TempSrc:      SpO2: 96% 95% 99% 97%  Weight:      Height:       Weight change:   Intake/Output Summary (Last 24 hours) at 01/23/2021 1135 Last data filed at 01/23/2021 1031 Gross per 24 hour  Intake 480 ml  Output --  Net 480 ml    Net IO Since Admission: 2,714.88 mL [01/23/21 1135]   Physical Examination: General exam: AAOx 3 older than stated age, weak appearing. HEENT:Oral mucosa moist, Ear/Nose WNL grossly, dentition normal. Respiratory system: bilaterally diminished, no use of accessory muscle Cardiovascular system: S1 & S2 +, No JVD,. Gastrointestinal system: Abdomen soft, NT,ND, BS+ Nervous System:Alert, awake, moving extremities and grossly nonfocal Extremities: Right foot with dressing in place-TOE tips visible and pinkish Skin: No rashes,no icterus. MSK: Normal muscle bulk,tone, power    Medications reviewed:  Scheduled  Meds:  amLODipine  10 mg Oral Daily   aspirin EC  81 mg Oral BH-q7a   cholecalciferol  5,000 Units Oral BH-q7a   cloBAZam  5 mg Oral QHS   cloNIDine  0.1 mg Oral BID    docusate sodium  100 mg Oral BID   donepezil  10 mg Oral QHS   enoxaparin (LOVENOX) injection  40 mg Subcutaneous Q24H   feeding supplement  237 mL Oral BID BM   hydrALAZINE  25 mg Oral Q8H   lamoTRIgine  100 mg Oral BID   lamoTRIgine  25 mg Oral QPM   multivitamin with minerals  1 tablet Oral Daily   pantoprazole  40 mg Oral Daily   potassium chloride  40 mEq Oral Daily   propranolol ER  60 mg Oral Daily   QUEtiapine  50 mg Oral QHS   rosuvastatin  20 mg Oral QHS   sertraline  100 mg Oral Daily   vitamin B-12  1,000 mcg Oral Daily   Continuous Infusions:   Diet Order             Diet regular Room service appropriate? Yes; Fluid consistency: Thin  Diet effective now                   Weight change:   Wt Readings from Last 3 Encounters:  01/19/21 79.2 kg  01/13/21 79.2 kg  08/31/20 89 kg  Consultants:see note  Procedures:see note Antimicrobials: Anti-infectives (From admission, onward)    Start     Dose/Rate Route Frequency Ordered Stop   01/20/21 1800  ceFAZolin (ANCEF) IVPB 2g/100 mL premix        2 g 200 mL/hr over 30 Minutes Intravenous Every 6 hours 01/20/21 1651 01/21/21 0709   01/20/21 1150  ceFAZolin (ANCEF) 2-4 GM/100ML-% IVPB       Note to Pharmacy: Norton Blizzard  : cabinet override      01/20/21 1150 01/20/21 1315   01/20/21 0000  ceFAZolin (ANCEF) IVPB 2g/100 mL premix        2 g 200 mL/hr over 30 Minutes Intravenous 30 min pre-op 01/19/21 1746 01/20/21 1336      Culture/Microbiology    Component Value Date/Time   SDES BLOOD RIGHT HAND 08/26/2020 0255   SPECREQUEST AEROBIC BOTTLE ONLY Blood Culture adequate volume 08/26/2020 0255   CULT  08/26/2020 0255    NO GROWTH 5 DAYS Performed at [facility], [place] [address] [place], [place] [address]    REPTSTATUS 08/31/2020 FINAL 08/26/2020 0255    Other culture-see note  Unresulted Labs (From admission, onward)    None      Data Reviewed: I have personally reviewed following labs  and imaging studies CBC: Recent Labs  Lab 01/20/21 0603 01/21/21 0457  WBC 8.9 9.5  NEUTROABS 6.8  --   HGB 11.2* 10.0*  HCT 37.4 33.9*  MCV 73.5* 73.4*  PLT 241 010    Basic Metabolic Panel: Recent Labs  Lab 01/20/21 0603 01/21/21 0457 01/22/21 0722 01/23/21 0630  NA 138 137  --  138  K 3.5 3.0*  --  4.1  CL 102 105  --  106  CO2 27 25  --  26  GLUCOSE 123* 96  --  94  BUN 11 12  --  15  CREATININE 0.74 0.71  --  0.65  CALCIUM 9.2 9.0  --  9.1  MG  --   --  2.2  --  GFR: Estimated Creatinine Clearance: 70.1 mL/min (by C-G formula based on SCr of 0.65 mg/dL). Liver Function Tests: Recent Labs  Lab 01/20/21 0603  AST 38  ALT 51*  ALKPHOS 105  BILITOT 0.7  PROT 7.9  ALBUMIN 4.0    No results for input(s): LIPASE, AMYLASE in the last 168 hours. No results for input(s): AMMONIA in the last 168 hours. Coagulation Profile: No results for input(s): INR, PROTIME in the last 168 hours. Cardiac Enzymes: No results for input(s): CKTOTAL, CKMB, CKMBINDEX, TROPONINI in the last 168 hours. BNP (last 3 results) No results for input(s): PROBNP in the last 8760 hours. HbA1C: No results for input(s): HGBA1C in the last 72 hours. CBG: No results for input(s): GLUCAP in the last 168 hours. Lipid Profile: No results for input(s): CHOL, HDL, LDLCALC, TRIG, CHOLHDL, LDLDIRECT in the last 72 hours. Thyroid Function Tests: No results for input(s): TSH, T4TOTAL, FREET4, T3FREE, THYROIDAB in the last 72 hours. Anemia Panel: No results for input(s): VITAMINB12, FOLATE, FERRITIN, TIBC, IRON, RETICCTPCT in the last 72 hours. Sepsis Labs: No results for input(s): PROCALCITON, LATICACIDVEN in the last 168 hours.  Recent Results (from the past 240 hour(s))  Resp Panel by RT-PCR (Flu A&B, Covid) Nasopharyngeal Swab     Status: None   Collection Time: 01/20/21  9:40 AM   Specimen: Nasopharyngeal Swab; Nasopharyngeal(NP) swabs in vial transport medium  Result Value Ref Range  Status   SARS Coronavirus 2 by RT PCR NEGATIVE NEGATIVE Final    Comment: (NOTE) SARS-CoV-2 target nucleic acids are NOT DETECTED.  The SARS-CoV-2 RNA is generally detectable in upper respiratory specimens during the acute phase of infection. The lowest concentration of SARS-CoV-2 viral copies this assay can detect is 138 copies/mL. A negative result does not preclude SARS-Cov-2 infection and should not be used as the sole basis for treatment or other patient management decisions. A negative result may occur with  improper specimen collection/handling, submission of specimen other than nasopharyngeal swab, presence of viral mutation(s) within the areas targeted by this assay, and inadequate number of viral copies(<138 copies/mL). A negative result must be combined with clinical observations, patient history, and epidemiological information. The expected result is Negative.  Fact Sheet for Patients:  EntrepreneurPulse.com.au  Fact Sheet for Healthcare Providers:  IncredibleEmployment.be  This test is no t yet approved or cleared by the [place] FDA and  has been authorized for detection and/or diagnosis of SARS-CoV-2 by FDA under an Emergency Use Authorization (EUA). This EUA will remain  in effect (meaning this test can be used) for the duration of the COVID-19 declaration under Section 564(b)(1) of the Act, 21 U.S.C.section 360bbb-3(b)(1), unless the authorization is terminated  or revoked sooner.       Influenza A by PCR NEGATIVE NEGATIVE Final   Influenza B by PCR NEGATIVE NEGATIVE Final    Comment: (NOTE) The Xpert Xpress SARS-CoV-2/FLU/RSV plus assay is intended as an aid in the diagnosis of influenza from Nasopharyngeal swab specimens and should not be used as a sole basis for treatment. Nasal washings and aspirates are unacceptable for Xpert Xpress SARS-CoV-2/FLU/RSV testing.  Fact Sheet for  Patients: EntrepreneurPulse.com.au  Fact Sheet for Healthcare Providers: IncredibleEmployment.be  This test is not yet approved or cleared by the [place] FDA and has been authorized for detection and/or diagnosis of SARS-CoV-2 by FDA under an Emergency Use Authorization (EUA). This EUA will remain in effect (meaning this test can be used) for the duration of the COVID-19 declaration under Section 564(b)(1)  of the Act, 21 U.S.C. section 360bbb-3(b)(1), unless the authorization is terminated or revoked.  Performed at [facility], [address] [place], [place] [address]      Radiology Studies: No results found.   LOS: 0 days   Antonieta Pert, MD [facility]  01/23/2021, 11:35 AM

## 2021-01-23 NOTE — TOC Progression Note (Signed)
Transition of Care [facility]) - Progression Note    Patient Details  Name: Sandra Charles MRN: [id number] Date of Birth: February 03, 1956  Transition of [facility]) CM/SW [place], RN Phone Number: 01/23/2021, 4:12 PM  Clinical Narrative:   Met with the patient  's Niece in the room with the patient, I explained that there are still no bed offers, I explained that we can extend the search into Other areas.  She was agreeable, Sent to every SNF facility in [place], She stated that she knows people at [facility] and [facility], I encouraged her to reach out to them for assistance obtaining a bed offer. I reached out to [facility] and [facility] as well requesting a bed offer.  The patient's niece requested to have PT come back and work with the patient again, I reached out to the PT assigned and asked that he come again today at family request.    Expected Discharge Plan: [place] Barriers to Discharge: Continued Medical Work up  Expected Discharge Plan and Services Expected Discharge Plan: [place] arrangements for the past 2 months: Single Family Home                 DME Arranged: 3-N-1, Walker rolling, Wheelchair manual DME Agency: [employer] Date DME Agency Contacted: 01/21/21   Representative spoke with at DME Agency: [place]: PT, OT, RN [facility] Agency: [facility] Date [place]: 01/21/21   Representative spoke with at [place]: [place] Determinants of Health ([place]) Interventions    Readmission Risk Interventions No flowsheet data found.

## 2021-01-23 NOTE — Progress Notes (Signed)
Nutrition Follow-up  DOCUMENTATION CODES:   Obesity unspecified  INTERVENTION:   -D/c Ensure Enlive po BID, each supplement provides 350 kcal and 20 grams of protein  -Continue MVI with minerals daily  NUTRITION DIAGNOSIS:   Increased nutrient needs related to post-op healing as evidenced by estimated needs.  Ongoing  GOAL:   Patient will meet greater than or equal to 90% of their needs  Progressing   MONITOR:   PO intake, Supplement acceptance, Diet advancement, Labs, Weight trends, Skin, I & O's  REASON FOR ASSESSMENT:   Consult Assessment of nutrition requirement/status  ASSESSMENT:   Sandra Charles is a 65 y.o. female with medical history significant for Seizure disorder with recurrent breakthrough seizures, hospitalized in July/22, most recently seen in the ED on 11/18 for same, as well as history of anxiety, chronic cannabis use, depression, headaches, TIA, who presents with a mechanical fall after she missed a step falling down 3-4 stairs injuring her right ankle.  The fall was not preceded by a seizure, lightheadedness or dizziness, visual disturbance or one-sided weakness, numbness or tingling, no chest pain or shortness of breath or palpitations.  She did not hit her head and did not have loss of consciousness.  She denies prior alcohol or substance abuse  11/25- s/p Open reduction and internal fixation of medial and lateral malleolar fractures, right ankle.  Reviewed I/O's: +240 ml x 24 hours and +2.5 L since admission  UOP: 400 ml x 24 hours   Pt receiving nursing care at time of visit.   Pt remains with good appetite. Noted meal completions 100%. Pt is consuming Ensure Enlive supplements.  Per therapy notes, plan to d/c to SNF once medically stable.   Medications reviewed and include vitamin D3, colace, potassium chloride, and vitamin B-12.   Labs reviewed.   NUTRITION - FOCUSED PHYSICAL EXAM:  Flowsheet Row Most Recent Value  Orbital Region No  depletion  Upper Arm Region No depletion  Thoracic and Lumbar Region No depletion  Buccal Region No depletion  Temple Region No depletion  Clavicle and Acromion Bone Region No depletion  Scapular Bone Region No depletion  Dorsal Hand No depletion  Patellar Region No depletion  Anterior Thigh Region No depletion  Posterior Calf Region No depletion  Edema (RD Assessment) None  Hair Reviewed  Eyes Reviewed  Mouth Reviewed  Skin Reviewed  Nails Reviewed       Diet Order:   Diet Order             Diet regular Room service appropriate? Yes; Fluid consistency: Thin  Diet effective now                   EDUCATION NEEDS:   No education needs have been identified at this time  Skin:  Skin Assessment: Skin Integrity Issues: Skin Integrity Issues:: Incisions Incisions: closed rt leg  Last BM:  01/23/21  Height:   Ht Readings from Last 1 Encounters:  01/19/21 5\' 2"  (1.575 m)    Weight:   Wt Readings from Last 1 Encounters:  01/19/21 79.2 kg    Ideal Body Weight:  50 kg  BMI:  Body mass index is 31.94 kg/m.  Estimated Nutritional Needs:   Kcal:  1550-1750  Protein:  85-100 grams  Fluid:  > 1.5 L    Loistine Chance, RD, LDN, [place] Registered Dietitian II Certified Diabetes Care and Education Specialist Please refer to [facility] for RD and/or RD on-call/weekend/after hours pager

## 2021-01-24 ENCOUNTER — Encounter: Payer: Self-pay | Admitting: Surgery

## 2021-01-24 DIAGNOSIS — S82851A Displaced trimalleolar fracture of right lower leg, initial encounter for closed fracture: Secondary | ICD-10-CM | POA: Diagnosis not present

## 2021-01-24 NOTE — Progress Notes (Signed)
Brief Narrative/Hospital Course: Sandra Charles, 65 y.o. female with PMH of seizure disorder with recurrent breakthrough seizure recent hospitalization in July 2022, and recently seen in ED on 11/18 for the same, anxiety/depression, chronic cannabis use, headaches, TIA brought to the ED after a fall after missing a step while going down and coming with a right ankle injury where x-ray showed "XRAY: Acute trimalleolar right ankle fracture with posterior dislocation of the talus relative to the tibia".  Chest x-ray no acute finding, CT head CT C-spine degenerative changes, chronic microvascular ischemic changes, in the ED hypertensive blood work, EKG are done, relatively stable orthopedic was consulted and admitted for further management. underwent ORIF of medial and lateral malleolar fractures on right ankle by Dr. Roland Rack 11/25  Subjective:  Patient is doing clinically well.  Still has pain in right ankle joint.  Pain has been well controlled on current regimen of pain medication.  Patient was seen and evaluated by PT today.  Patient was able to safely transition from bed to recliner with rolling walker and close palpation.  She remains a candidate for short-term rehab.  Objective: Vital signs in last 24 hours: Temp:  [97.6 F (36.4 C)-98.6 F (37 C)] 97.7 F (36.5 C) (11/29 1121) Pulse Rate:  [63-74] 67 (11/29 1121) Resp:  [15-20] 16 (11/29 1121) BP: (139-176)/(56-67) 150/60 (11/29 1121) SpO2:  [95 %-100 %] 97 % (11/29 1121) Weight change:  Last BM Date: 01/23/21  Intake/Output from previous day: 11/28 0701 - 11/29 0700 In: 492 [P.O.:480; I.V.:12] Out: 400 [Urine:400] Intake/Output this shift: No intake/output data recorded.  General appearance: alert, cooperative, and appears stated age Head: Normocephalic, without obvious abnormality, atraumatic Neck: no adenopathy, no carotid bruit, no JVD, supple, symmetrical, trachea midline, and thyroid not enlarged, symmetric, no  tenderness/mass/nodules Resp: clear to auscultation bilaterally Cardio: regular rate and rhythm, S1, S2 normal, no murmur, click, rub or gallop GI: soft, non-tender; bowel sounds normal; no masses,  no organomegaly Extremities: Significant for right ankle joint with dressing in place.  Patient is able to move toes up and down.  Capillary refill remains intact.  No obvious discharge. Skin: Skin color, texture, turgor normal. No rashes or lesions or limited exam of right foot due to dressing in place.  Lab Results: No results for input(s): WBC, HGB, HCT, PLT in the last 72 hours. BMET Recent Labs    01/23/21 0630  NA 138  K 4.1  CL 106  CO2 26  GLUCOSE 94  BUN 15  CREATININE 0.65  CALCIUM 9.1    Studies/Results: No results found.  Medications: Scheduled:  amLODipine  10 mg Oral Daily   aspirin EC  81 mg Oral BH-q7a   cholecalciferol  5,000 Units Oral BH-q7a   cloBAZam  5 mg Oral QHS   cloNIDine  0.1 mg Oral BID   docusate sodium  100 mg Oral BID   donepezil  10 mg Oral QHS   enoxaparin (LOVENOX) injection  40 mg Subcutaneous Q24H   hydrALAZINE  25 mg Oral Q8H   lamoTRIgine  100 mg Oral BID   lamoTRIgine  25 mg Oral QPM   multivitamin with minerals  1 tablet Oral Daily   pantoprazole  40 mg Oral Daily   potassium chloride  40 mEq Oral Daily   propranolol ER  60 mg Oral Daily   QUEtiapine  50 mg Oral QHS   rosuvastatin  20 mg Oral QHS   sertraline  100 mg Oral Daily   vitamin B-12  1,000 mcg Oral Daily    Assessment/Plan: Closed trimalleolar fracture of the right ankle joint.  Status post open reduction internal fixation.  Pain is adequately controlled on current regimen.  PT and OT continue to work with patient to help with his skilled needs.  Currently awaiting short-term rehab placement as per PT and OT recommendations.  Acute on chronic anemia: Likely due to.  Blood loss.  Hemoglobin remained stable.  No indication for PRBC transfusion at this time.  We will  continue to monitor H&H daily.  Essential hypertension: Blood pressure is well controlled on amlodipine, propranolol and clonidine.  Low-salt diet advised.  Hypokalemia: Resolved with KCl replacement.  History of TIA: Patient is on aspirin daily.  Class I obesity: BMI of 31.94.  Lifestyle modification counseling was advised.  VTE prophylaxis with Lovenox 40 mg.  Started on 12/29/14/2022.  SCDs in place.  Plan of care for the day was clearly explained to patient.  Patient understands disposition plan as detailed.  Patient is planned for short-term rehab  LOS: 0 days   [place] 01/24/2021, 1:16 PM

## 2021-01-24 NOTE — Progress Notes (Signed)
Physical Therapy Treatment Patient Details Name: Sandra Charles MRN: [id number] DOB: [birth year] Today's Date: 01/24/2021   History of Present Illness Sandra Charles is a 89yoF who comes to [facility] on s/p fall with L ankle fx and now s/p ORIF and NWB.    PT Comments    Pt in bed upon arrival, mentation more clear this date. Pt has no obvious limitation from pain in session, tolerates LAQ in short sitting and STS transfers, fwd hopping gait. Pt able to safely transition from bed to recliner with RW and close supervision. Pt able to hop forward ~67ft over 2 minutes, not functional for ADL performance at present in home environment, however improved from tolerance previous day. STR remains most practical option at this time as patient is unable to mobilize limited household distances.     Recommendations for follow up therapy are one component of a multi-disciplinary discharge planning process, led by the attending physician.  Recommendations may be updated based on patient status, additional functional criteria and insurance authorization.  Follow Up Recommendations  Skilled nursing-short term rehab (<3 hours/day)     Assistance Recommended at Discharge Frequent or constant Supervision/Assistance  Equipment Recommendations       Recommendations for Other Services       Precautions / Restrictions Precautions Precautions: Fall Restrictions RLE Weight Bearing: Non weight bearing     Mobility  Bed Mobility Overal bed mobility: Needs Assistance Bed Mobility: Supine to Sit       Sit to supine: HOB elevated;Supervision        Transfers Overall transfer level: Needs assistance Equipment used: Rolling walker (2 wheels) Transfers: Sit to/from Stand;Bed to chair/wheelchair/BSC Sit to Stand: Mod assist Stand pivot transfers: Min guard (extensive cues given, additional time required, no physical assist, no LOB)         General transfer comment: can perform with minA and minGuard from  elevated EOB    Ambulation/Gait Ambulation/Gait assistance: Min guard Gait Distance (Feet): 7 Feet Assistive device: Rolling walker (2 wheels) Gait Pattern/deviations: Step-to pattern       General Gait Details: able to maintain NWB and perform a limited hopping gait with RW use, minimal foot clearance achieved bilat   Stairs             Wheelchair Mobility    Modified Rankin (Stroke Patients Only)       Balance                                            Cognition Arousal/Alertness: Awake/alert Behavior During Therapy: WFL for tasks assessed/performed Overall Cognitive Status: Within Functional Limits for tasks assessed (no frank signs of AMS this session)                                          Exercises General Exercises - Lower Extremity Long Arc Quad: AROM;15 reps;Both;Seated    General Comments        Pertinent Vitals/Pain Pain Assessment: No/denies pain    Home Living                          Prior Function            PT Goals (current goals can now be found in  the care plan section) Acute Rehab PT Goals Patient Stated Goal: go home PT Goal Formulation: With patient Time For Goal Achievement: 02/04/21 Potential to Achieve Goals: Poor Progress towards PT goals: Progressing toward goals    Frequency    BID      PT Plan Current plan remains appropriate    Co-evaluation              AM-PAC PT "6 Clicks" Mobility   Outcome Measure  Help needed turning from your back to your side while in a flat bed without using bedrails?: A Little Help needed moving from lying on your back to sitting on the side of a flat bed without using bedrails?: A Little Help needed moving to and from a bed to a chair (including a wheelchair)?: A Lot Help needed standing up from a chair using your arms (e.g., wheelchair or bedside chair)?: A Lot Help needed to walk in hospital room?: A Lot Help needed  climbing 3-5 steps with a railing? : Total 6 Click Score: 13    End of Session Equipment Utilized During Treatment: Gait belt Activity Tolerance: Patient tolerated treatment well;Patient limited by fatigue Patient left: in chair;with call bell/phone within reach;with chair alarm set Nurse Communication: Mobility status PT Visit Diagnosis: Muscle weakness (generalized) (M62.81);Difficulty in walking, not elsewhere classified (R26.2);Pain Pain - Right/Left: Right Pain - part of body: Ankle and joints of foot     Time: 7001-7494 PT Time Calculation (min) (ACUTE ONLY): 23 min  Charges:  $Therapeutic Activity: 23-37 mins           12:45 PM, 01/24/21 Etta Grandchild, PT, DPT Physical Therapist - [facility]  [phone] ([place])    [place] C 01/24/2021, 12:42 PM

## 2021-01-24 NOTE — TOC Progression Note (Signed)
Transition of Care [facility]) - Progression Note    Patient Details  Name: KAMILLA HANDS MRN: [id number] Date of Birth: 23-Nov-1955  Transition of Care [facility]) CM/SW [place], RN Phone Number: 01/24/2021, 9:46 AM  Clinical Narrative:   Damaris Schooner with [name] she would [facility], I notified Tina at [facility] and asked them to start the Ins auth process with [facility]    Expected Discharge Plan: [place] Barriers to Discharge: Continued Medical Work up  Expected Discharge Plan and Services Expected Discharge Plan: [place] arrangements for the past 2 months: Single Family Home                 DME Arranged: 3-N-1, Walker rolling, Wheelchair manual DME Agency: [employer] Date DME Agency Contacted: 01/21/21   Representative spoke with at DME Agency: [place]: PT, OT, RN [facility] Agency: [place] Date [place]: 01/21/21   Representative spoke with at [place]: [place] Determinants of Health ([place]) Interventions    Readmission Risk Interventions No flowsheet data found.

## 2021-01-24 NOTE — Progress Notes (Signed)
PT Cancellation Note  Patient Details Name: Sandra Charles MRN: [id number] DOB: [birth year]   Cancelled Treatment:      Chart reviewed, pt has not had pain meds this afternoon yet. Discussed with RN who also voiced concerns of ongoing AMS/sleepiness. Will attempt treatment again at later date/time.   8:29 AM, 01/24/21 Etta Grandchild, PT, DPT Physical Therapist - [facility]  [phone] ([place])    [place] C 01/24/2021, 8:28 AM

## 2021-01-24 NOTE — Plan of Care (Signed)

## 2021-01-24 NOTE — Progress Notes (Signed)
Subjective: 4 Days Post-Op Procedure(s) (LRB): OPEN REDUCTION INTERNAL FIXATION (ORIF) ANKLE FRACTURE (Right) Patient is sleeping well without any signs of pain.  Patient is well, patient denies significant pain in the right ankle this morning. Plan is for d/c to SNF at this time.  Waiting on bed availability. Negative for chest pain and shortness of breath Fever: no Gastrointestinal:Negative for nausea and vomiting Patient has had a BM.  Objective: Vital signs in last 24 hours: Temp:  [98 F (36.7 C)-98.6 F (37 C)] 98 F (36.7 C) (11/29 0447) Pulse Rate:  [64-74] 64 (11/29 0447) Resp:  [15-20] 20 (11/29 0447) BP: (136-155)/(56-67) 155/67 (11/29 0447) SpO2:  [97 %-100 %] 98 % (11/29 0447)  Intake/Output from previous day:  Intake/Output Summary (Last 24 hours) at 01/24/2021 0727 Last data filed at 01/23/2021 1600 Gross per 24 hour  Intake 492 ml  Output 400 ml  Net 92 ml    Intake/Output this shift: No intake/output data recorded.  Labs: No results for input(s): HGB in the last 72 hours.  No results for input(s): WBC, RBC, HCT, PLT in the last 72 hours.  Recent Labs    01/23/21 0630  NA 138  K 4.1  CL 106  CO2 26  BUN 15  CREATININE 0.65  GLUCOSE 94  CALCIUM 9.1   No results for input(s): LABPT, INR in the last 72 hours.   EXAM General - Patient is sleeping soundly this morning but is arousable. Extremity - ABD soft Short leg splint intact to the right leg. No evidence of loosening.  Able to flex and extend toes on command. Cap refill intact to each toe. Intact to light touch over the dorsal and volar aspect of exposed foot and proximal aspect of the leg.  Past Medical History:  Diagnosis Date   Anxiety    Arthritis    "all over"   Chronic lower back pain    Depression    GERD (gastroesophageal reflux disease)    Headache    "weekly" (07/15/2015)   Hyperlipidemia    Hypertension    Mini stroke    "several since 05/2014" (07/15/2015)    Seizures ([place]) dx'd 04/2015    Assessment/Plan: 4 Days Post-Op Procedure(s) (LRB): OPEN REDUCTION INTERNAL FIXATION (ORIF) ANKLE FRACTURE (Right) Principal Problem:   Trimalleolar fracture of ankle, closed, right, initial encounter Active Problems:   Essential hypertension   Seizure disorder (HCC)   Schizoaffective disorder ([place])   Accidental fall   Preoperative clearance  Estimated body mass index is 31.94 kg/m as calculated from the following:   Height as of this encounter: 5\' 2"  (1.575 m).   Weight as of this encounter: 79.2 kg. Advance diet Up with therapy D/C IV fluids when tolerating po intake.  Vitals reviewed this AM. Patient lives with fiance but does have several steps to go up and down. Patient was not able to progress with therapy as planned.  Plan is for d/c to SNF. Patient can d/c to SNF today pending bed approval and placement. Patient has had a BM.  Start on Aspirin 325mg  daily on discharge. Follow-up with [facility] orthopaedics in 10-14 days for splint removal and x-rays.  DVT Prophylaxis - Lovenox Non-weightbearing to the right leg.  Sandra Aiza Vollrath, PA-C [facility] 01/24/2021, 7:27 AM

## 2021-01-24 NOTE — Progress Notes (Signed)
Physical Therapy Treatment Patient Details Name: Sandra Charles MRN: [id number] DOB: [birth year] Today's Date: 01/24/2021   History of Present Illness Sandra Charles is a 73yoF who comes to [facility] on s/p fall with L ankle fx and now s/p ORIF and NWB.    PT Comments    Pt back in bed upon entry, asleep, made awake easily with voice. Pt less lucid this session, unable to say who assisted her back to bed earlier. Pt also has poor recollection of conversation earlier in first session about going to STR at DC. Pt able to advance AMB to 47ft getting into hallway, but quickly fatigues and is unable to go farther, requires a chair follow for safe return to room. Hop clearance is improved compared to morning, ~2 inches now, but far below requisite threshold for AMB stairs for home entry at DC. Going to STR remain most pragmatic option at this point, goal to maximize NWB mobility for optimal independence and safety at eventual DC to home.     Recommendations for follow up therapy are one component of a multi-disciplinary discharge planning process, led by the attending physician.  Recommendations may be updated based on patient status, additional functional criteria and insurance authorization.  Follow Up Recommendations  Skilled nursing-short term rehab (<3 hours/day)     Assistance Recommended at Discharge Frequent or constant Supervision/Assistance  Equipment Recommendations       Recommendations for Other Services       Precautions / Restrictions Precautions Precautions: Fall Restrictions RLE Weight Bearing: Non weight bearing     Mobility  Bed Mobility Overal bed mobility: Needs Assistance Bed Mobility: Supine to Sit     Supine to sit: Min guard;Supervision;HOB elevated Sit to supine: Supervision;Min assist        Transfers Overall transfer level: Needs assistance Equipment used: Rolling walker (2 wheels) Transfers: Sit to/from Stand;Bed to chair/wheelchair/BSC Sit to Stand: Min  guard Stand pivot transfers: Min guard;From elevated surface         General transfer comment: can perform with minA and minGuard from elevated EOB    Ambulation/Gait Ambulation/Gait assistance: Min guard Gait Distance (Feet): 20 Feet (37ft into hallway, turns around, but unable to go farther due to fatigue) Assistive device: Rolling walker (2 wheels) Gait Pattern/deviations: Step-to pattern       General Gait Details: able to maintain NWB and perform a limited hopping gait with RW use, minimal foot clearance achieved bilat   Stairs             Wheelchair Mobility    Modified Rankin (Stroke Patients Only)       Balance                                            Cognition Arousal/Alertness: Awake/alert Behavior During Therapy: WFL for tasks assessed/performed Overall Cognitive Status: Within Functional Limits for tasks assessed                                          Exercises General Exercises - Lower Extremity Long Arc Quad: AROM;15 reps;Both;Seated    General Comments        Pertinent Vitals/Pain Pain Assessment: No/denies pain    Home Living  Prior Function            PT Goals (current goals can now be found in the care plan section) Acute Rehab PT Goals Patient Stated Goal: go home PT Goal Formulation: With patient Time For Goal Achievement: 02/04/21 Potential to Achieve Goals: Poor Progress towards PT goals: Progressing toward goals    Frequency    BID      PT Plan Current plan remains appropriate    Co-evaluation              AM-PAC PT "6 Clicks" Mobility   Outcome Measure  Help needed turning from your back to your side while in a flat bed without using bedrails?: A Little Help needed moving from lying on your back to sitting on the side of a flat bed without using bedrails?: A Little Help needed moving to and from a bed to a chair (including a  wheelchair)?: A Lot Help needed standing up from a chair using your arms (e.g., wheelchair or bedside chair)?: A Lot Help needed to walk in hospital room?: A Lot Help needed climbing 3-5 steps with a railing? : Total 6 Click Score: 13    End of Session Equipment Utilized During Treatment: Gait belt Activity Tolerance: Patient tolerated treatment well;Patient limited by fatigue Patient left: with call bell/phone within reach;in bed;with bed alarm set Nurse Communication: Mobility status PT Visit Diagnosis: Muscle weakness (generalized) (M62.81);Difficulty in walking, not elsewhere classified (R26.2);Pain Pain - Right/Left: Right Pain - part of body: Ankle and joints of foot     Time: 1433-1456 PT Time Calculation (min) (ACUTE ONLY): 23 min  Charges:  $Gait Training: 8-22 mins $Therapeutic Activity: 8-22 mins               3:38 PM, 01/24/21 Etta Grandchild, PT, DPT Physical Therapist - [facility]  [phone] ([place])     [place] C 01/24/2021, 3:35 PM

## 2021-01-25 DIAGNOSIS — I10 Essential (primary) hypertension: Secondary | ICD-10-CM

## 2021-01-25 DIAGNOSIS — E871 Hypo-osmolality and hyponatremia: Secondary | ICD-10-CM

## 2021-01-25 DIAGNOSIS — S82851A Displaced trimalleolar fracture of right lower leg, initial encounter for closed fracture: Secondary | ICD-10-CM | POA: Diagnosis not present

## 2021-01-25 LAB — CBC WITH DIFFERENTIAL/PLATELET
Abs Immature Granulocytes: 0.02 10*3/uL (ref 0.00–0.07)
Basophils Absolute: 0 10*3/uL (ref 0.0–0.1)
Basophils Relative: 0 %
Eosinophils Absolute: 0.2 10*3/uL (ref 0.0–0.5)
Eosinophils Relative: 2 %
HCT: 33.2 % — ABNORMAL LOW (ref 36.0–46.0)
Hemoglobin: 10 g/dL — ABNORMAL LOW (ref 12.0–15.0)
Immature Granulocytes: 0 %
Lymphocytes Relative: 24 %
Lymphs Abs: 1.7 10*3/uL (ref 0.7–4.0)
MCH: 22 pg — ABNORMAL LOW (ref 26.0–34.0)
MCHC: 30.1 g/dL (ref 30.0–36.0)
MCV: 73 fL — ABNORMAL LOW (ref 80.0–100.0)
Monocytes Absolute: 0.5 10*3/uL (ref 0.1–1.0)
Monocytes Relative: 7 %
Neutro Abs: 4.9 10*3/uL (ref 1.7–7.7)
Neutrophils Relative %: 67 %
Platelets: 308 10*3/uL (ref 150–400)
RBC: 4.55 MIL/uL (ref 3.87–5.11)
RDW: 21.5 % — ABNORMAL HIGH (ref 11.5–15.5)
WBC: 7.3 10*3/uL (ref 4.0–10.5)
nRBC: 0 % (ref 0.0–0.2)

## 2021-01-25 LAB — BASIC METABOLIC PANEL
Anion gap: 7 (ref 5–15)
BUN: 16 mg/dL (ref 8–23)
CO2: 28 mmol/L (ref 22–32)
Calcium: 9.7 mg/dL (ref 8.9–10.3)
Chloride: 98 mmol/L (ref 98–111)
Creatinine, Ser: 0.63 mg/dL (ref 0.44–1.00)
GFR, Estimated: >60 mL/min (ref >60)
Glucose, Bld: 93 mg/dL (ref 70–99)
Potassium: 4.6 mmol/L (ref 3.5–5.1)
Sodium: 133 mmol/L — ABNORMAL LOW (ref 135–145)

## 2021-01-25 LAB — RESP PANEL BY RT-PCR (FLU A&B, COVID) ARPGX2
Influenza A by PCR: NEGATIVE
Influenza B by PCR: NEGATIVE
SARS Coronavirus 2 by RT PCR: NEGATIVE

## 2021-01-25 NOTE — Progress Notes (Signed)
Physical Therapy Treatment Patient Details Name: Sandra Charles MRN: [id number] DOB: 1955/07/30 Today's Date: 01/25/2021   History of Present Illness Sandra Charles is a 84yoF who comes to [facility] on s/p fall with L ankle fx and now s/p ORIF and NWB.    PT Comments    Pt in bed upon entry, assisted by NA. Pt more lethargic and confused today. Pt also more limited by weakness and pain in RLE today. Pt requires minA to EOB, modA to rise, unable to achieve independent standing, ultimately requires mod-maxA stand pivot transfer to recliner. Pt unable to participate in gait training or exercises at present due to mentation, but will reconsider in afternoon if pt is more lucid.      Recommendations for follow up therapy are one component of a multi-disciplinary discharge planning process, led by the attending physician.  Recommendations may be updated based on patient status, additional functional criteria and insurance authorization.  Follow Up Recommendations  Skilled nursing-short term rehab (<3 hours/day)     Assistance Recommended at Discharge Frequent or constant Supervision/Assistance  Equipment Recommendations       Recommendations for Other Services       Precautions / Restrictions Precautions Precautions: Fall Restrictions RLE Weight Bearing: Non weight bearing     Mobility  Bed Mobility Overal bed mobility: Needs Assistance Bed Mobility: Supine to Sit     Supine to sit: Min assist     General bed mobility comments: much more weak today and pain limited, difficulty with trunk control, attending to task    Transfers Overall transfer level: Needs assistance Equipment used: Rolling walker (2 wheels) Transfers: Sit to/from Stand;Bed to chair/wheelchair/BSC Sit to Stand: Min assist;From elevated surface Stand pivot transfers: Mod assist         General transfer comment: unable to achieve full upright stance this date, despite RW, modA lift, and additional time. Pt also  unable to achieve RLE NWB this date, whereas this was easy yesterday.    Ambulation/Gait Ambulation/Gait assistance:  (defered due to worse mentation and weakness this date)                 Marine scientist Rankin (Stroke Patients Only)       Balance                                            Cognition Arousal/Alertness: Lethargic     Area of Impairment: Attention;Following commands;Memory;Awareness                                        Exercises      General Comments        Pertinent Vitals/Pain Pain Assessment:  (more sorenss today, whereas none previous day)    Home Living                          Prior Function            PT Goals (current goals can now be found in the care plan section) Acute Rehab PT Goals Patient Stated Goal: go home PT Goal Formulation: With patient Time For Goal Achievement: 02/04/21 Potential to Achieve  Goals: Fair Progress towards PT goals: Not progressing toward goals - comment    Frequency    BID      PT Plan Current plan remains appropriate    Co-evaluation              AM-PAC PT "6 Clicks" Mobility   Outcome Measure  Help needed turning from your back to your side while in a flat bed without using bedrails?: A Lot Help needed moving from lying on your back to sitting on the side of a flat bed without using bedrails?: A Lot Help needed moving to and from a bed to a chair (including a wheelchair)?: A Lot Help needed standing up from a chair using your arms (e.g., wheelchair or bedside chair)?: A Lot Help needed to walk in hospital room?: Total Help needed climbing 3-5 steps with a railing? : Total 6 Click Score: 10    End of Session Equipment Utilized During Treatment: Gait belt Activity Tolerance: Patient tolerated treatment well;Patient limited by fatigue Patient left: with call bell/phone within reach;in  chair;with chair alarm set Nurse Communication: Mobility status PT Visit Diagnosis: Muscle weakness (generalized) (M62.81);Difficulty in walking, not elsewhere classified (R26.2);Pain Pain - Right/Left: Right Pain - part of body: Ankle and joints of foot     Time: 4497-5300 PT Time Calculation (min) (ACUTE ONLY): 12 min  Charges:  $Therapeutic Activity: 8-22 mins                    3:42 PM, 01/25/21 Etta Grandchild, PT, DPT Physical Therapist - [facility]  [phone] ([place])     Verlyn Dannenberg C 01/25/2021, 3:40 PM

## 2021-01-25 NOTE — Progress Notes (Signed)
Nutrition Follow-up  DOCUMENTATION CODES:   Obesity unspecified  INTERVENTION:   -Continue MVI with minerals daily  NUTRITION DIAGNOSIS:   Increased nutrient needs related to post-op healing as evidenced by estimated needs.  Ongoing  GOAL:   Patient will meet greater than or equal to 90% of their needs  Progressing   MONITOR:   PO intake, Supplement acceptance, Diet advancement, Labs, Weight trends, Skin, I & O's  REASON FOR ASSESSMENT:   Consult Assessment of nutrition requirement/status  ASSESSMENT:   Sandra Charles is a 64 y.o. female with medical history significant for Seizure disorder with recurrent breakthrough seizures, hospitalized in July/22, most recently seen in the ED on 11/18 for same, as well as history of anxiety, chronic cannabis use, depression, headaches, TIA, who presents with a mechanical fall after she missed a step falling down 3-4 stairs injuring her right ankle.  The fall was not preceded by a seizure, lightheadedness or dizziness, visual disturbance or one-sided weakness, numbness or tingling, no chest pain or shortness of breath or palpitations.  She did not hit her head and did not have loss of consciousness.  She denies prior alcohol or substance abuse  11/25- s/p Open reduction and internal fixation of medial and lateral malleolar fractures, right ankle.  Reviewed I/O's: -500 ml x 24 hours and +2.1 L since admission   Spoke with pt over the phone, who reports very good appetite, consuming most of her meals. Noted meal completions 50-100%. She remains on a regular diet and is able to find menu items that she enjoys. Discussed importance of continued good oral intake to support healing.   Per TOC notes, pt awaiting insurance authorization to d/c to SNF.   Medications reviewed and include vitamin D3, colace, potassium chloride, and vitamin B-12.   Labs reviewed: Na: 133.    Diet Order:   Diet Order             Diet regular Room service  appropriate? Yes; Fluid consistency: Thin  Diet effective now                   EDUCATION NEEDS:   No education needs have been identified at this time  Skin:  Skin Assessment: Skin Integrity Issues: Skin Integrity Issues:: Incisions Incisions: closed rt leg  Last BM:  01/25/21  Height:   Ht Readings from Last 1 Encounters:  01/19/21 5\' 2"  (1.575 m)    Weight:   Wt Readings from Last 1 Encounters:  01/19/21 79.2 kg    Ideal Body Weight:  50 kg  BMI:  Body mass index is 31.94 kg/m.  Estimated Nutritional Needs:   Kcal:  1550-1750  Protein:  85-100 grams  Fluid:  > 1.5 L    Loistine Chance, RD, LDN, [place] Registered Dietitian II Certified Diabetes Care and Education Specialist Please refer to [facility] for RD and/or RD on-call/weekend/after hours pager

## 2021-01-25 NOTE — Progress Notes (Signed)
Patient refusing all medications up at this time. Will continue to monitor.

## 2021-01-25 NOTE — TOC Progression Note (Signed)
Transition of Care [facility]) - Progression Note    Patient Details  Name: Sandra Charles MRN: [id number] Date of Birth: 05/31/1955  Transition of [facility]) CM/SW [place], RN Phone Number: 01/25/2021, 3:54 PM  Clinical Narrative:   Received notification that insurance was approved, [facility]    Expected Discharge Plan: [place] Barriers to Discharge: Continued Medical Work up  Expected Discharge Plan and Services Expected Discharge Plan: [place] arrangements for the past 2 months: Single Family Home                 DME Arranged: 3-N-1, Walker rolling, Wheelchair manual DME Agency: [employer] Date DME Agency Contacted: 01/21/21   Representative spoke with at DME Agency: [place]: PT, OT, RN [facility] Agency: [place] Date [place]: 01/21/21   Representative spoke with at [place]: [place] Determinants of Health ([place]) Interventions    Readmission Risk Interventions No flowsheet data found.

## 2021-01-25 NOTE — Progress Notes (Signed)
Physical Therapy Treatment Patient Details Name: Sandra Charles MRN: [id number] DOB: [birth year] Today's Date: 01/25/2021   History of Present Illness Sandra Charles is a 37yoF who comes to [facility] on s/p fall with L ankle fx and now s/p ORIF and NWB.    PT Comments    Pt was sitting in recliner upon arriving. She agrees to PT session and is cooperative throughout. Does follow commands consistently throughout however some cognition deficits are present. She was oriented to situation and is agreeable to go to rehab at DC. Was able to maintain NWB RLE during transfers and even with pivoting to/from EOB > recliner. She tolerated performing there ex in recliner, see exercises listed below. Will need rehab at DC to address deficits while maximizing independence with ADLs.    Recommendations for follow up therapy are one component of a multi-disciplinary discharge planning process, led by the attending physician.  Recommendations may be updated based on patient status, additional functional criteria and insurance authorization.  Follow Up Recommendations  Skilled nursing-short term rehab (<3 hours/day)     Assistance Recommended at Discharge Frequent or constant Supervision/Assistance  Equipment Recommendations  Rolling walker (2 wheels);BSC/3in1;Wheelchair (measurements PT)       Precautions / Restrictions Precautions Precautions: Fall Restrictions Weight Bearing Restrictions: Yes RLE Weight Bearing: Non weight bearing     Mobility  Bed Mobility Overal bed mobility: Needs Assistance Bed Mobility: Supine to Sit     Supine to sit: Min assist     General bed mobility comments: in recliner pre session. transferred to transport chair at conclusion of session with RN tech taking her to visit family upstairs.    Transfers Overall transfer level: Needs assistance Equipment used: Rolling walker (2 wheels) Transfers: Sit to/from Stand Sit to Stand: Min assist;From elevated surface Stand pivot  transfers: Min assist;Mod assist         General transfer comment: pt performed STS from recliner 5 x total. min assist to stand however min-mod to pivot to/from bed>recliner. Performed pivot to/from 3 x    Ambulation/Gait Ambulation/Gait assistance: Min guard Gait Distance (Feet): 3 Feet Assistive device: Rolling walker (2 wheels) Gait Pattern/deviations:  (pivot on LLE. fatigues quickly and is unable to "hop") Gait velocity: decreased     General Gait Details: pt was only able to clear floor 1-2 times to hop a few ft however fatigued quickly requiring to pivot on LLE versus hopping. Did maintain NWB RLE pretty well overall.     Balance Overall balance assessment: Needs assistance Sitting-balance support: Bilateral upper extremity supported Sitting balance-Leahy Scale: Fair     Standing balance support: Bilateral upper extremity supported;During functional activity Standing balance-Leahy Scale: Poor         Cognition Arousal/Alertness: Awake/alert Behavior During Therapy: WFL for tasks assessed/performed Overall Cognitive Status: Within Functional Limits for tasks assessed Area of Impairment: Attention;Following commands;Memory;Awareness       Memory: Decreased recall of precautions;Decreased short-term memory Following Commands: Follows one step commands consistently;Follows one step commands with increased time       General Comments: Pt is wide awake throughout session. Consistently able to follow commands with increased time. Pt does have some cognition deficits come to light throughout session. Per son who arrived at conclusion of session, "She still seems a little off."        Exercises General Exercises - Lower Extremity Quad Sets: AROM;10 reps;Both Gluteal Sets: AROM;10 reps Long Arc Quad: AROM;10 reps Heel Slides: AROM;10 reps Hip ABduction/ADduction: AROM;10 reps;Both Straight Leg  Raises: AROM;Both;10 reps        Pertinent Vitals/Pain Pain  Assessment: 0-10 Pain Score: 3  Pain Location: Rt ankle Pain Descriptors / Indicators: Discomfort Pain Intervention(s): Limited activity within patient's tolerance;Monitored during session;Repositioned     PT Goals (current goals can now be found in the care plan section) Acute Rehab PT Goals Patient Stated Goal: none stated PT Goal Formulation: With patient Time For Goal Achievement: 02/04/21 Potential to Achieve Goals: Fair Progress towards PT goals: Not progressing toward goals - comment    Frequency    BID      PT Plan Current plan remains appropriate       AM-PAC PT "6 Clicks" Mobility   Outcome Measure  Help needed turning from your back to your side while in a flat bed without using bedrails?: A Lot Help needed moving from lying on your back to sitting on the side of a flat bed without using bedrails?: A Lot Help needed moving to and from a bed to a chair (including a wheelchair)?: A Lot Help needed standing up from a chair using your arms (e.g., wheelchair or bedside chair)?: A Lot Help needed to walk in hospital room?: Total Help needed climbing 3-5 steps with a railing? : Total 6 Click Score: 10    End of Session Equipment Utilized During Treatment: Gait belt Activity Tolerance: Patient tolerated treatment well;Patient limited by fatigue Patient left: with call bell/phone within reach;in chair;with chair alarm set Nurse Communication: Mobility status PT Visit Diagnosis: Muscle weakness (generalized) (M62.81);Difficulty in walking, not elsewhere classified (R26.2);Pain Pain - Right/Left: Right Pain - part of body: Ankle and joints of foot     Time: 6433-2951 PT Time Calculation (min) (ACUTE ONLY): 25 min  Charges:  $Therapeutic Exercise: 8-22 mins $Therapeutic Activity: 8-22 mins                     Julaine Fusi PTA 01/25/21, 4:59 PM

## 2021-01-25 NOTE — Progress Notes (Signed)
PROGRESS NOTE    Sandra Charles  ELF:[id number] DOB: February 22, 1956 DOA: 01/19/2021 PCP: Ashley Jacobs, MD    Brief Narrative:  Sandra Charles, 65 y.o. female with PMH of seizure disorder with recurrent breakthrough seizure recent hospitalization in July 2022, and recently seen in ED on 11/18 for the same, anxiety/depression, chronic cannabis use, headaches, TIA brought to the ED after a fall after missing a step while going down and coming with a right ankle injury where x-ray showed "XRAY: Acute trimalleolar right ankle fracture with posterior dislocation of the talus relative to the tibia".  Chest x-ray no acute finding, CT head CT C-spine degenerative changes, chronic microvascular ischemic changes, in the ED hypertensive blood work, EKG are done, relatively stable orthopedic was consulted and admitted for further management. underwent ORIF of medial and lateral malleolar fractures on right ankle by Dr. Roland Rack 11/25   Assessment & Plan:   Principal Problem:   Trimalleolar fracture of ankle, closed, right, initial encounter Active Problems:   Essential hypertension   Seizure disorder ([place])   Schizoaffective disorder ([place])   Accidental fall   Preoperative clearance  Closed trimalleolar fracture of the right ankle joint.  Status post ORIF.  Currently doing well, continue PT/OT, pending nursing home placement.  Acute blood loss anemia with chronic anemia. Hemoglobin has been stable.  Essential hypertension. Continue home medicines per  Hypokalemia. Repleted.  History of TIA. Continue aspirin.    DVT prophylaxis: Lovenox Code Status: full Family Communication: Daughter updated at bedside. Disposition Plan:    Status is: Observation  Pending nursing home placement.       I/O last 3 completed shifts: In: -  Out: 500 [Urine:500] Total I/O In: 240 [P.O.:240] Out: -      Consultants:  ortho  Procedures:   Antimicrobials: None Subjective: Patient doing well,  she has good appetite, no nausea vomiting.  She had a bowel movement today. Denies any short of breath or cough. No fever chills No dysuria hematuria.  Objective: Vitals:   01/24/21 1948 01/25/21 0401 01/25/21 0739 01/25/21 1121  BP: (!) 158/63 (!) 166/70 (!) 161/93 (!) 131/45  Pulse: 78 77 73 69  Resp: 16 18 15 15   Temp: 98.9 F (37.2 C) 98.5 F (36.9 C) 98.5 F (36.9 C) (!) 97.5 F (36.4 C)  TempSrc: Oral     SpO2: 98% 100% 99% 98%  Weight:      Height:        Intake/Output Summary (Last 24 hours) at 01/25/2021 1236 Last data filed at 01/25/2021 1021 Gross per 24 hour  Intake 240 ml  Output 500 ml  Net -260 ml   Filed Weights   01/19/21 1612  Weight: 79.2 kg    Examination:  General exam: Appears calm and comfortable  Respiratory system: Clear to auscultation. Respiratory effort normal. Cardiovascular system: S1 & S2 heard, RRR. No JVD, murmurs, rubs, gallops or clicks. No pedal edema. Gastrointestinal system: Abdomen is nondistended, soft and nontender. No organomegaly or masses felt. Normal bowel sounds heard. Central nervous system: Alert and oriented x2. No focal neurological deficits. Extremities: Symmetric 5 x 5 power. Skin: No rashes, lesions or ulcers Psychiatry: Judgement and insight appear normal. Mood & affect appropriate.     Data Reviewed: I have personally reviewed following labs and imaging studies  CBC: Recent Labs  Lab 01/20/21 0603 01/21/21 0457 01/25/21 0145  WBC 8.9 9.5 7.3  NEUTROABS 6.8  --  4.9  HGB 11.2* 10.0* 10.0*  HCT 37.4  33.9* 33.2*  MCV 73.5* 73.4* 73.0*  PLT 241 205 720   Basic Metabolic Panel: Recent Labs  Lab 01/20/21 0603 01/21/21 0457 01/22/21 0722 01/23/21 0630 01/25/21 0145  NA 138 137  --  138 133*  K 3.5 3.0*  --  4.1 4.6  CL 102 105  --  106 98  CO2 27 25  --  26 28  GLUCOSE 123* 96  --  94 93  BUN 11 12  --  15 16  CREATININE 0.74 0.71  --  0.65 0.63  CALCIUM 9.2 9.0  --  9.1 9.7  MG  --   --  2.2   --   --    GFR: Estimated Creatinine Clearance: 70.1 mL/min (by C-G formula based on SCr of 0.63 mg/dL). Liver Function Tests: Recent Labs  Lab 01/20/21 0603  AST 38  ALT 51*  ALKPHOS 105  BILITOT 0.7  PROT 7.9  ALBUMIN 4.0   No results for input(s): LIPASE, AMYLASE in the last 168 hours. No results for input(s): AMMONIA in the last 168 hours. Coagulation Profile: No results for input(s): INR, PROTIME in the last 168 hours. Cardiac Enzymes: No results for input(s): CKTOTAL, CKMB, CKMBINDEX, TROPONINI in the last 168 hours. BNP (last 3 results) No results for input(s): PROBNP in the last 8760 hours. HbA1C: No results for input(s): HGBA1C in the last 72 hours. CBG: No results for input(s): GLUCAP in the last 168 hours. Lipid Profile: No results for input(s): CHOL, HDL, LDLCALC, TRIG, CHOLHDL, LDLDIRECT in the last 72 hours. Thyroid Function Tests: No results for input(s): TSH, T4TOTAL, FREET4, T3FREE, THYROIDAB in the last 72 hours. Anemia Panel: No results for input(s): VITAMINB12, FOLATE, FERRITIN, TIBC, IRON, RETICCTPCT in the last 72 hours. Sepsis Labs: No results for input(s): PROCALCITON, LATICACIDVEN in the last 168 hours.  Recent Results (from the past 240 hour(s))  Resp Panel by RT-PCR (Flu A&B, Covid) Nasopharyngeal Swab     Status: None   Collection Time: 01/20/21  9:40 AM   Specimen: Nasopharyngeal Swab; Nasopharyngeal(NP) swabs in vial transport medium  Result Value Ref Range Status   SARS Coronavirus 2 by RT PCR NEGATIVE NEGATIVE Final    Comment: (NOTE) SARS-CoV-2 target nucleic acids are NOT DETECTED.  The SARS-CoV-2 RNA is generally detectable in upper respiratory specimens during the acute phase of infection. The lowest concentration of SARS-CoV-2 viral copies this assay can detect is 138 copies/mL. A negative result does not preclude SARS-Cov-2 infection and should not be used as the sole basis for treatment or other patient management decisions. A  negative result may occur with  improper specimen collection/handling, submission of specimen other than nasopharyngeal swab, presence of viral mutation(s) within the areas targeted by this assay, and inadequate number of viral copies(<138 copies/mL). A negative result must be combined with clinical observations, patient history, and epidemiological information. The expected result is Negative.  Fact Sheet for Patients:  EntrepreneurPulse.com.au  Fact Sheet for Healthcare Providers:  IncredibleEmployment.be  This test is no t yet approved or cleared by the [place] FDA and  has been authorized for detection and/or diagnosis of SARS-CoV-2 by FDA under an Emergency Use Authorization (EUA). This EUA will remain  in effect (meaning this test can be used) for the duration of the COVID-19 declaration under Section 564(b)(1) of the Act, 21 U.S.C.section 360bbb-3(b)(1), unless the authorization is terminated  or revoked sooner.       Influenza A by PCR NEGATIVE NEGATIVE Final   Influenza B  by PCR NEGATIVE NEGATIVE Final    Comment: (NOTE) The Xpert Xpress SARS-CoV-2/FLU/RSV plus assay is intended as an aid in the diagnosis of influenza from Nasopharyngeal swab specimens and should not be used as a sole basis for treatment. Nasal washings and aspirates are unacceptable for Xpert Xpress SARS-CoV-2/FLU/RSV testing.  Fact Sheet for Patients: EntrepreneurPulse.com.au  Fact Sheet for Healthcare Providers: IncredibleEmployment.be  This test is not yet approved or cleared by the [place] FDA and has been authorized for detection and/or diagnosis of SARS-CoV-2 by FDA under an Emergency Use Authorization (EUA). This EUA will remain in effect (meaning this test can be used) for the duration of the COVID-19 declaration under Section 564(b)(1) of the Act, 21 U.S.C. section 360bbb-3(b)(1), unless the authorization  is terminated or revoked.  Performed at [facility], [address], [place], [place] [address]          Radiology Studies: No results found.      Scheduled Meds:  amLODipine  10 mg Oral Daily   aspirin EC  81 mg Oral BH-q7a   cholecalciferol  5,000 Units Oral BH-q7a   cloBAZam  5 mg Oral QHS   cloNIDine  0.1 mg Oral BID   docusate sodium  100 mg Oral BID   donepezil  10 mg Oral QHS   enoxaparin (LOVENOX) injection  40 mg Subcutaneous Q24H   hydrALAZINE  25 mg Oral Q8H   lamoTRIgine  100 mg Oral BID   lamoTRIgine  25 mg Oral QPM   multivitamin with minerals  1 tablet Oral Daily   pantoprazole  40 mg Oral Daily   potassium chloride  40 mEq Oral Daily   propranolol ER  60 mg Oral Daily   QUEtiapine  50 mg Oral QHS   rosuvastatin  20 mg Oral QHS   sertraline  100 mg Oral Daily   vitamin B-12  1,000 mcg Oral Daily   Continuous Infusions:   LOS: 0 days    Time spent: 26 minutes    Sharen Hones, MD [facility]   To contact the attending provider between 7A-7P or the covering provider during after hours 7P-7A, please log into the web site [email or web address] and access using universal [place] password for that web site. If you do not have the password, please call the hospital operator.  01/25/2021, 12:36 PM

## 2021-01-25 NOTE — Progress Notes (Signed)
Subjective: 5 Days Post-Op Procedure(s) (LRB): OPEN REDUCTION INTERNAL FIXATION (ORIF) ANKLE FRACTURE (Right) Patient is sleeping well without any signs of pain.  Patient is well, patient denies significant pain in the right ankle this morning. Plan is for d/c to SNF at this time.  Waiting on bed availability. Negative for chest pain and shortness of breath Fever: no Gastrointestinal:Negative for nausea and vomiting Patient has had a BM.  Objective: Vital signs in last 24 hours: Temp:  [97.5 F (36.4 C)-98.9 F (37.2 C)] 97.5 F (36.4 C) (11/30 1121) Pulse Rate:  [59-78] 69 (11/30 1121) Resp:  [15-18] 15 (11/30 1121) BP: (131-166)/(45-93) 131/45 (11/30 1121) SpO2:  [96 %-100 %] 98 % (11/30 1121)  Intake/Output from previous day:  Intake/Output Summary (Last 24 hours) at 01/25/2021 1154 Last data filed at 01/25/2021 1021 Gross per 24 hour  Intake 240 ml  Output 500 ml  Net -260 ml    Intake/Output this shift: Total I/O In: 240 [P.O.:240] Out: -   Labs: Recent Labs    01/25/21 0145  HGB 10.0*    Recent Labs    01/25/21 0145  WBC 7.3  RBC 4.55  HCT 33.2*  PLT 308    Recent Labs    01/23/21 0630 01/25/21 0145  NA 138 133*  K 4.1 4.6  CL 106 98  CO2 26 28  BUN 15 16  CREATININE 0.65 0.63  GLUCOSE 94 93  CALCIUM 9.1 9.7   No results for input(s): LABPT, INR in the last 72 hours.   EXAM General - Patient is awake and alert this AM without signs of pain. Extremity - ABD soft Short leg splint intact to the right leg. No evidence of loosening.  Able to flex and extend toes on command. Cap refill intact to each toe. Intact to light touch over the dorsal and volar aspect of exposed foot and proximal aspect of the leg.  Past Medical History:  Diagnosis Date   Anxiety    Arthritis    "all over"   Chronic lower back pain    Depression    GERD (gastroesophageal reflux disease)    Headache    "weekly" (07/15/2015)   Hyperlipidemia    Hypertension     Mini stroke    "several since 05/2014" (07/15/2015)   Seizures ([place]) dx'd 04/2015    Assessment/Plan: 5 Days Post-Op Procedure(s) (LRB): OPEN REDUCTION INTERNAL FIXATION (ORIF) ANKLE FRACTURE (Right) Principal Problem:   Trimalleolar fracture of ankle, closed, right, initial encounter Active Problems:   Essential hypertension   Seizure disorder ([facility])   Schizoaffective disorder ([place])   Accidental fall   Preoperative clearance  Estimated body mass index is 31.94 kg/m as calculated from the following:   Height as of this encounter: 5\' 2"  (1.575 m).   Weight as of this encounter: 79.2 kg. Advance diet Up with therapy D/C IV fluids when tolerating po intake.  Vitals reviewed this AM. Patient lives with fiance but does have several steps to go up and down. Patient was not able to progress with therapy as planned.  Plan is for d/c to SNF. Patient can d/c to SNF today pending bed approval and placement. Patient has had a BM.  Start on Aspirin 325mg  daily on discharge. Follow-up with [facility] orthopaedics in 10-14 days for splint removal and x-rays.  DVT Prophylaxis - Lovenox Non-weightbearing to the right leg.  Raquel Lawrie Tunks, PA-C [facility] 01/25/2021, 11:54 AM

## 2021-01-26 DIAGNOSIS — S82851A Displaced trimalleolar fracture of right lower leg, initial encounter for closed fracture: Secondary | ICD-10-CM | POA: Diagnosis not present

## 2021-01-26 DIAGNOSIS — I10 Essential (primary) hypertension: Secondary | ICD-10-CM | POA: Diagnosis not present

## 2021-01-26 DIAGNOSIS — E876 Hypokalemia: Secondary | ICD-10-CM | POA: Diagnosis not present

## 2021-01-26 NOTE — TOC Progression Note (Signed)
Transition of Care [facility]) - Progression Note    Patient Details  Name: Sandra Charles MRN: [id number] Date of Birth: 1956/01/30  [facility] CM/SW [place], RN Phone Number: 01/26/2021, 10:22 AM  Clinical Narrative:   Patient going to [facility] room 611[facility], her niece [name] and son is aware EMS called, nurse to call report    Expected Discharge Plan: [place] Barriers to Discharge: Continued Medical Work up  Expected Discharge Plan and Services Expected Discharge Plan: [place] arrangements for the past 2 months: Single Family Home Expected Discharge Date: 01/26/21               DME Arranged: Berta Minor rolling, Wheelchair manual DME Agency: [employer] Date DME Agency Contacted: 01/21/21   Representative spoke with at DME Agency: [place]: PT, OT, RN [facility] Agency: [place] Date [place]: 01/21/21   Representative spoke with at [place]: [place] ([place]) Interventions    Readmission Risk Interventions No flowsheet data found.

## 2021-01-26 NOTE — Discharge Summary (Signed)
Physician Discharge Summary  Patient ID: [name] MRN: [id number] DOB/AGE: Jun 15, 1955 65 y.o.  Admit date: 01/19/2021 Discharge date: 01/26/2021  Admission Diagnoses:  Discharge Diagnoses:  Principal Problem:   Trimalleolar fracture of ankle, closed, right, initial encounter Active Problems:   Essential hypertension   Hypokalemia   Seizure disorder ([place])   Schizoaffective disorder ([place])   Accidental fall   Preoperative clearance   Hyponatremia   Discharged Condition: good  Hospital Course:  Sandra Charles, 65 y.o. female with PMH of seizure disorder with recurrent breakthrough seizure recent hospitalization in July 2022, and recently seen in ED on 11/18 for the same, anxiety/depression, chronic cannabis use, headaches, TIA brought to the ED after a fall after missing a step while going down and coming with a right ankle injury where x-ray showed "XRAY: Acute trimalleolar right ankle fracture with posterior dislocation of the talus relative to the tibia".  Chest x-ray no acute finding, CT head CT C-spine degenerative changes, chronic microvascular ischemic changes, in the ED hypertensive blood work, EKG are done, relatively stable orthopedic was consulted and admitted for further management. underwent ORIF of medial and lateral malleolar fractures on right ankle by Dr. Roland Rack 11/25  Closed trimalleolar fracture of the right ankle joint.  Status post ORIF.  Patient has been accepted to go to nursing home today, will be transferred.  Acute blood loss anemia with chronic anemia. Hemoglobin has been stable.  Essential hypertension. Continue home medicines   Hypokalemia. Repleted.  History of TIA. Continue aspirin.  Consults: orthopedic surgery  Significant Diagnostic Studies:   Treatments: ORRIF  Discharge Exam: Blood pressure (!) 140/58, pulse 60, temperature 98.3 F (36.8 C), temperature source Oral, resp. rate 18, height 5\' 2"  (1.575 m), weight 79.2 kg, SpO2 98  %. General appearance: alert and cooperative Resp: clear to auscultation bilaterally Cardio: regular rate and rhythm, S1, S2 normal, no murmur, click, rub or gallop GI: soft, non-tender; bowel sounds normal; no masses,  no organomegaly Extremities: extremities normal, atraumatic, no cyanosis or edema  Disposition: Discharge disposition: 03-Skilled Nursing Facility       Discharge Instructions     Diet - low sodium heart healthy   Complete by: As directed    Discharge instructions   Complete by: As directed    Please call call MD or return to ER for similar or worsening recurring problem that brought you to hospital or if any fever,nausea/vomiting,abdominal pain, uncontrolled pain, chest pain,  shortness of breath or any other alarming symptoms.  Follow up with [facility] orthopaedics in 10-14 days for splint removal and x-rays.  Please follow-up your doctor as instructed in a week time and call the office for appointment.  Please avoid alcohol, smoking, or any other illicit substance and maintain healthy habits including taking your regular medications as prescribed.  You were cared for by a hospitalist during your hospital stay. If you have any questions about your discharge medications or the care you received while you were in the hospital after you are discharged, you can call the unit and ask to speak with the hospitalist on call if the hospitalist that took care of you is not available.  Once you are discharged, your primary care physician will handle any further medical issues. Please note that NO REFILLS for any discharge medications will be authorized once you are discharged, as it is imperative that you return to your primary care physician (or establish a relationship with a primary care physician if you do not have  one) for your aftercare needs so that they can reassess your need for medications and monitor your lab values   Discharge wound care:   Complete by: As directed     Reinforce dressing until discontinued   Discharge wound care:   Complete by: As directed    Follow with ortho for suture removal, keep clean   Increase activity slowly   Complete by: As directed    Increase activity slowly   Complete by: As directed       Allergies as of 01/26/2021       Reactions   Lovastatin Other (See Comments)   Unknown reaction        Medication List     TAKE these medications    amLODipine 10 MG tablet Commonly known as: NORVASC Take 10 mg by mouth daily. For high blood pressure   aspirin EC 325 MG tablet Take 1 tablet (325 mg total) by mouth daily. What changed:  medication strength how much to take when to take this   benzonatate 100 MG capsule Commonly known as: TESSALON Take 1 capsule (100 mg total) by mouth 3 (three) times daily.   cloBAZam 10 MG tablet Commonly known as: Onfi Take 0.5 tablets (5 mg total) by mouth at bedtime.   cloNIDine 0.1 MG tablet Commonly known as: CATAPRES Take 1 tablet (0.1 mg total) by mouth 2 (two) times daily.   donepezil 10 MG tablet Commonly known as: ARICEPT Take 10 mg by mouth at bedtime.   HYDROcodone-acetaminophen 5-325 MG tablet Commonly known as: NORCO/VICODIN Take 1-2 tablets by mouth every 6 (six) hours as needed for moderate pain.   lamoTRIgine 100 MG tablet Commonly known as: LAMICTAL 100 mg in morning and 125 mg in evening.   lamoTRIgine 25 MG tablet Commonly known as: LAMICTAL Take 1 tablet (25 mg total) by mouth every evening. Along with 100 mg tablet for total of 125 mg in evening.   pantoprazole 40 MG tablet Commonly known as: Protonix Take 1 tablet (40 mg total) by mouth daily.   propranolol ER 60 MG 24 hr capsule Commonly known as: INDERAL LA Take 60 mg by mouth daily.   QUEtiapine 50 MG Tb24 24 hr tablet Commonly known as: SEROQUEL XR Take 50 mg by mouth at bedtime.   rosuvastatin 20 MG tablet Commonly known as: CRESTOR Take 20 mg by mouth at bedtime.   sertraline  100 MG tablet Commonly known as: ZOLOFT Take 100 mg by mouth daily.   vitamin B-12 1000 MCG tablet Commonly known as: CYANOCOBALAMIN Take 1,000 mcg by mouth daily.   Vitamin D3 125 MCG (5000 UT) Tabs Take 1 tablet (5,000 Units total) by mouth every morning.               Durable Medical Equipment  (From admission, onward)           Start     Ordered   01/22/21 0930  For home use only DME lightweight manual wheelchair with seat cushion  Once       Comments: Patient suffers from ankle fracture non weight bearing, which impairs their ability to perform daily activities like ADLs in the home.  A walking aide  will not resolve issue with performing activities of daily living. A wheelchair will allow patient to safely perform daily activities. Patient is not able to propel themselves in the home using a standard weight wheelchair due to weakness. Patient can self propel in the lightweight wheelchair. Length of need 6  months. Accessories: elevating leg rests (ELRs), wheel locks, extensions and anti-tippers. Back Cushion   01/22/21 0930   01/22/21 0929  For home use only DME Walker rolling  Once       Question Answer Comment  Walker: With [place]   Patient needs a walker to treat with the following condition Weakness      01/22/21 0930              Discharge Care Instructions  (From admission, onward)           Start     Ordered   01/26/21 0000  Discharge wound care:       Comments: Follow with ortho for suture removal, keep clean   01/26/21 0929   01/22/21 0000  Discharge wound care:       Comments: Reinforce dressing until discontinued   01/22/21 1112            Follow-up Information     Lattie Corns, PA-C Follow up in 14 day(s).   Specialty: Physician Assistant Why: Electa Sniff information: [place] [address] [phone]                 Signed: Sharen Hones 01/26/2021, 9:29 AM

## 2021-01-26 NOTE — Plan of Care (Signed)

## 2021-01-26 NOTE — Plan of Care (Signed)
  Problem: Coping: Goal: Level of anxiety will decrease 01/26/2021 1043 by Trula Slade, RN Outcome: Adequate for Discharge 01/26/2021 1042 by Trula Slade, RN Outcome: Adequate for Discharge

## 2021-01-29 ENCOUNTER — Emergency Department: Payer: Medicare HMO

## 2021-01-29 ENCOUNTER — Other Ambulatory Visit: Payer: Self-pay

## 2021-01-29 ENCOUNTER — Emergency Department
Admission: EM | Admit: 2021-01-29 | Discharge: 2021-02-01 | Disposition: A | Discharge status: Home / Self Care | Payer: Medicare HMO | Attending: Emergency Medicine | Admitting: Emergency Medicine

## 2021-01-29 DIAGNOSIS — Z7982 Long term (current) use of aspirin: Secondary | ICD-10-CM | POA: Insufficient documentation

## 2021-01-29 DIAGNOSIS — I10 Essential (primary) hypertension: Secondary | ICD-10-CM | POA: Insufficient documentation

## 2021-01-29 DIAGNOSIS — Z79899 Other long term (current) drug therapy: Secondary | ICD-10-CM | POA: Diagnosis not present

## 2021-01-29 DIAGNOSIS — R410 Disorientation, unspecified: Secondary | ICD-10-CM | POA: Diagnosis not present

## 2021-01-29 DIAGNOSIS — F0392 Unspecified dementia, unspecified severity, with psychotic disturbance: Secondary | ICD-10-CM

## 2021-01-29 DIAGNOSIS — F259 Schizoaffective disorder, unspecified: Secondary | ICD-10-CM | POA: Diagnosis not present

## 2021-01-29 DIAGNOSIS — R4182 Altered mental status, unspecified: Secondary | ICD-10-CM | POA: Diagnosis present

## 2021-01-29 DIAGNOSIS — R296 Repeated falls: Secondary | ICD-10-CM | POA: Insufficient documentation

## 2021-01-29 DIAGNOSIS — F03918 Unspecified dementia, unspecified severity, with other behavioral disturbance: Secondary | ICD-10-CM | POA: Diagnosis not present

## 2021-01-29 DIAGNOSIS — Z20822 Contact with and (suspected) exposure to covid-19: Secondary | ICD-10-CM | POA: Diagnosis not present

## 2021-01-29 DIAGNOSIS — Y900 Blood alcohol level of less than 20 mg/100 ml: Secondary | ICD-10-CM | POA: Insufficient documentation

## 2021-01-29 DIAGNOSIS — Z87891 Personal history of nicotine dependence: Secondary | ICD-10-CM | POA: Diagnosis not present

## 2021-01-29 DIAGNOSIS — F0390 Unspecified dementia without behavioral disturbance: Secondary | ICD-10-CM | POA: Insufficient documentation

## 2021-01-29 DIAGNOSIS — Z9181 History of falling: Secondary | ICD-10-CM | POA: Insufficient documentation

## 2021-01-29 DIAGNOSIS — F22 Delusional disorders: Secondary | ICD-10-CM | POA: Diagnosis not present

## 2021-01-29 DIAGNOSIS — F251 Schizoaffective disorder, depressive type: Secondary | ICD-10-CM | POA: Diagnosis not present

## 2021-01-29 DIAGNOSIS — F250 Schizoaffective disorder, bipolar type: Secondary | ICD-10-CM | POA: Diagnosis present

## 2021-01-29 LAB — COMPREHENSIVE METABOLIC PANEL
ALT: 46 U/L — ABNORMAL HIGH (ref 0–44)
AST: 41 U/L (ref 15–41)
Albumin: 3.8 g/dL (ref 3.5–5.0)
Alkaline Phosphatase: 121 U/L (ref 38–126)
Anion gap: 14 (ref 5–15)
BUN: 22 mg/dL (ref 8–23)
CO2: 26 mmol/L (ref 22–32)
Calcium: 10.4 mg/dL — ABNORMAL HIGH (ref 8.9–10.3)
Chloride: 96 mmol/L — ABNORMAL LOW (ref 98–111)
Creatinine, Ser: 0.92 mg/dL (ref 0.44–1.00)
GFR, Estimated: >60 mL/min (ref >60)
Glucose, Bld: 110 mg/dL — ABNORMAL HIGH (ref 70–99)
Potassium: 4.3 mmol/L (ref 3.5–5.1)
Sodium: 136 mmol/L (ref 135–145)
Total Bilirubin: 0.9 mg/dL (ref 0.3–1.2)
Total Protein: 9.5 g/dL — ABNORMAL HIGH (ref 6.5–8.1)

## 2021-01-29 LAB — LIPASE, BLOOD: Lipase: 46 U/L (ref 11–51)

## 2021-01-29 LAB — ACETAMINOPHEN LEVEL: Acetaminophen (Tylenol), Serum: <10 ug/mL — ABNORMAL LOW (ref 10–30)

## 2021-01-29 LAB — CBG MONITORING, ED: Glucose-Capillary: 113 mg/dL — ABNORMAL HIGH (ref 70–99)

## 2021-01-29 LAB — ETHANOL: Alcohol, Ethyl (B): <10 mg/dL (ref <10)

## 2021-01-29 MED ORDER — HALOPERIDOL LACTATE 5 MG/ML IJ SOLN
5.0000 mg | Freq: Once | INTRAMUSCULAR | Status: AC
  Administered 2021-01-30: 5 mg via INTRAVENOUS
  Filled 2021-01-29: qty 1

## 2021-01-29 NOTE — ED Triage Notes (Signed)
Pt arrives via [facility] from [facility] with CC of altered mental status. Staff reports pt has not been taking schizophrenia medications and has been hallucinating. Pt is suspicious and has positive flight of ideas when conversing.

## 2021-01-29 NOTE — ED Provider Notes (Signed)
[facility] Emergency Department Provider Note  ____________________________________________  Time seen: Approximately 11:14 PM  I have reviewed the triage vital signs and the nursing notes.   HISTORY  Chief Complaint Altered Mental Status    Level 5 Caveat: Portions of the History and Physical including HPI and review of systems are unable to be completely obtained due to patient being a poor historian   HPI Sandra Charles is a 65 y.o. female a past history of schizoaffective disorder, dementia who is sent [facility] at the request of her son for psychiatric evaluation.  Per EMS, patient is having increased paranoia and disorganized thoughts over the past few days.  She has been noted to be walking on her right leg which recently sustained a fracture.  She had ORIF of right ankle fracture is 10 days ago, and on discharge from the hospital was intended to be nonweightbearing on the right leg.  Patient endorses pain in the right leg, denies any other acute symptoms.  She is worried that people are trying to poison her     Past Medical History:  Diagnosis Date   Anxiety    Arthritis    "all over"   Chronic lower back pain    Depression    GERD (gastroesophageal reflux disease)    Headache    "weekly" (07/15/2015)   Hyperlipidemia    Hypertension    Mini stroke    "several since 05/2014" (07/15/2015)   Seizures ([place]) dx'd 04/2015     Patient Active Problem List   Diagnosis Date Noted   Hyponatremia 01/25/2021   Trimalleolar fracture of ankle, closed, right, initial encounter 01/19/2021   Accidental fall 01/19/2021   Preoperative clearance 01/19/2021   Seizures ([place]) 08/26/2020   Respiratory failure with hypoxia ([place])    Aspiration into airway    Acute pulmonary edema ([facility])    Tetrahydrocannabinol (THC) dependence ([place])    Sepsis ([place]) 09/22/2018   Altered mental status 03/27/2018   HLD (hyperlipidemia) 10/05/2017   Seizure  ([place])    UTI (urinary tract infection) 08/17/2015   Seizure disorder ([place]) 08/10/2015   Cannabis use disorder, moderate, dependence ([place]) 08/10/2015   Schizoaffective disorder ([place]) 08/10/2015   Abnormal finding on MRI of brain    Hypokalemia 07/15/2015   Confusion    GERD (gastroesophageal reflux disease) 11/25/2014   Essential hypertension 11/25/2014     Past Surgical History:  Procedure Laterality Date   DILATION AND CURETTAGE OF UTERUS     LUMBAR PUNCTURE  07/14/2015   ORIF ANKLE FRACTURE Right 01/20/2021   Procedure: OPEN REDUCTION INTERNAL FIXATION (ORIF) ANKLE FRACTURE;  Surgeon: Corky Mull, MD;  Location: [facility] ORS;  Service: Orthopedics;  Laterality: Right;   TUBAL LIGATION     VAGINAL HYSTERECTOMY       Prior to Admission medications   Medication Sig Start Date End Date Taking? Authorizing Provider  amLODipine (NORVASC) 10 MG tablet Take 10 mg by mouth daily. For high blood pressure   Yes [provider]  aspirin EC 325 MG tablet Take 1 tablet (325 mg total) by mouth daily. 01/21/21  Yes Lattie Corns, PA-C  benzonatate (TESSALON) 100 MG capsule Take 1 capsule (100 mg total) by mouth 3 (three) times daily. 08/31/20  Yes Lavina Hamman, MD  Cholecalciferol (VITAMIN D3) 125 MCG (5000 UT) TABS Take 1 tablet (5,000 Units total) by mouth every morning. 01/22/21 02/21/21 Yes Antonieta Pert, MD  cloBAZam (ONFI) 10 MG  tablet Take 0.5 tablets (5 mg total) by mouth at bedtime. 01/13/21  Yes Ward, Delice Bison, DO  cloNIDine (CATAPRES) 0.1 MG tablet Take 1 tablet (0.1 mg total) by mouth 2 (two) times daily. 07/15/15  Yes Wieting, Richard, MD  donepezil (ARICEPT) 10 MG tablet Take 10 mg by mouth at bedtime.   Yes [provider]  HYDROcodone-acetaminophen (NORCO/VICODIN) 5-325 MG tablet Take 1-2 tablets by mouth every 6 (six) hours as needed for moderate pain. 01/21/21  Yes Lattie Corns, PA-C  lamoTRIgine (LAMICTAL) 100 MG tablet 100 mg in morning and 125 mg in  evening. 08/31/20  Yes Lavina Hamman, MD  lamoTRIgine (LAMICTAL) 25 MG tablet Take 1 tablet (25 mg total) by mouth every evening. Along with 100 mg tablet for total of 125 mg in evening. 08/31/20  Yes Lavina Hamman, MD  OZEMPIC, 0.25 OR 0.5 MG/DOSE, 2 MG/1.5ML SOPN Inject 0.25 mg into the skin once a week. 01/18/21  Yes [provider]  pantoprazole (PROTONIX) 40 MG tablet Take 1 tablet (40 mg total) by mouth daily. 11/25/14  Yes Wellington Hampshire, MD  propranolol ER (INDERAL LA) 60 MG 24 hr capsule Take 60 mg by mouth daily. 09/15/17  Yes [provider]  QUEtiapine (SEROQUEL XR) 50 MG TB24 24 hr tablet Take 50 mg by mouth at bedtime.   Yes [provider]  rosuvastatin (CRESTOR) 20 MG tablet Take 20 mg by mouth at bedtime. 01/10/21  Yes [provider]  sertraline (ZOLOFT) 100 MG tablet Take 100 mg by mouth daily. 08/30/17  Yes [provider]  vitamin B-12 (CYANOCOBALAMIN) 1000 MCG tablet Take 1,000 mcg by mouth daily.   Yes [provider]     Allergies Lovastatin   Family History  Problem Relation Age of Onset   Hyperlipidemia Mother    Breast cancer Mother 22   Hypertension Mother    Heart attack Mother        age 72's   Hyperlipidemia Father    Hypertension Father    Heart attack Father 4   Breast cancer Sister 54    Social History Social History   Tobacco Use   Smoking status: Former    Packs/day: 1.00    Years: 5.00    Pack years: 5.00    Types: Cigarettes   Smokeless tobacco: Never   Tobacco comments:    "quit smoking cigarettes in the early 2000s"  Substance Use Topics   Alcohol use: Not Currently    Comment: 07/15/2015 "nothing in the last few years"   Drug use: No    Review of Systems Level 5 Caveat: Portions of the History and Physical including HPI and review of systems are unable to be completely obtained due to patient being a poor historian   Constitutional:   No known fever.  ENT:   No  rhinorrhea. Cardiovascular:   No chest pain or syncope. Respiratory:   No dyspnea or cough. Gastrointestinal:   Negative for abdominal pain, vomiting and diarrhea.  Musculoskeletal:   Right leg pain as above ____________________________________________   PHYSICAL EXAM:  VITAL SIGNS: ED Triage Vitals  Enc Vitals Group     BP 01/29/21 2249 (!) 198/86     Pulse Rate 01/29/21 2249 96     Resp 01/29/21 2249 (!) 28     Temp 01/29/21 2258 97.7 F (36.5 C)     Temp Source 01/29/21 2258 Oral     SpO2 01/29/21 2249 95 %  Weight 01/29/21 2254 180 lb (81.6 kg)     Height 01/29/21 2254 5\' 2"  (1.575 m)     Head Circumference --      [facility] --      Pain Score 01/29/21 2254 5     Pain Loc --      Pain Edu? --      Excl. in [place]? --     Vital signs reviewed, nursing assessments reviewed.   Constitutional:   Awake, alert, oriented x2. Non-toxic appearance. Eyes:   Conjunctivae are normal. EOMI. PERRL. ENT      Head:   Normocephalic and atraumatic.      Nose:   No congestion/rhinnorhea.       Mouth/Throat:   MMM, no pharyngeal erythema. No peritonsillar mass.       Neck:   No meningismus. Full ROM. Hematological/Lymphatic/Immunilogical:   No cervical lymphadenopathy. Cardiovascular:   RRR. Symmetric bilateral radial and DP pulses.  No murmurs. Cap refill less than 2 seconds. Respiratory:   Normal respiratory effort without tachypnea/retractions. Breath sounds are clear and equal bilaterally. No wheezes/rales/rhonchi. Gastrointestinal:   Soft and nontender. Non distended. There is no CVA tenderness.  No rebound, rigidity, or guarding. Genitourinary:   deferred Musculoskeletal:   Right lower leg is in a splint.  Compartments are soft.  No focal tenderness.  Good distal perfusion.  Toes are warm with normal capillary refill.  Left leg is normal.   Neurologic:   Normal speech, tangential disorganized language.  Expresses paranoid delusions. Motor grossly intact.  Skin:    Skin is warm,  dry and intact. No rash noted.  No petechiae, purpura, or bullae.  ____________________________________________    LABS (pertinent positives/negatives) (all labs ordered are listed, but only abnormal results are displayed) Labs Reviewed  CBC WITH DIFFERENTIAL/PLATELET - Abnormal; Notable for the following components:      Result Value   WBC 10.6 (*)    RBC 5.36 (*)    Hemoglobin 11.8 (*)    MCV 72.4 (*)    MCH 22.0 (*)    RDW 21.1 (*)    Platelets 474 (*)    All other components within normal limits  CBG MONITORING, ED - Abnormal; Notable for the following components:   Glucose-Capillary 113 (*)    All other components within normal limits  ACETAMINOPHEN LEVEL  COMPREHENSIVE METABOLIC PANEL  ETHANOL  LIPASE, BLOOD  SALICYLATE LEVEL  URINALYSIS, ROUTINE W REFLEX MICROSCOPIC  URINE DRUG SCREEN, QUALITATIVE ([facility] ONLY)   ____________________________________________   EKG    ____________________________________________    RADIOLOGY  No results found.  ____________________________________________   PROCEDURES Procedures  ____________________________________________    CLINICAL IMPRESSION / ASSESSMENT AND PLAN / ED COURSE  Medications ordered in the ED: Medications  haloperidol lactate (HALDOL) injection 5 mg (has no administration in time range)    Pertinent labs & imaging results that were available during my care of the patient were reviewed by me and considered in my medical decision making (see chart for details).   Sandra Charles was evaluated in Emergency Department on 01/29/2021 for the symptoms described in the history of present illness. She was evaluated in the context of the global COVID-19 pandemic, which necessitated consideration that the patient might be at risk for infection with the SARS-CoV-2 virus that causes COVID-19. Institutional protocols and algorithms that pertain to the evaluation of patients at risk for COVID-19 are in a state of rapid  change based on information released by regulatory bodies including  the [employer] and federal and state organizations. These policies and algorithms were followed during the patient's care in the ED.   Patient presents with decompensation of her schizophrenia, refusing medications today.  Will IVC for safety, give a dose of IV Haldol to help treat her psychosis.  Will check labs, x-ray of the right ankle to ensure no worsening of her injury from walking on it.  The patient has been placed in psychiatric observation due to the need to provide a safe environment for the patient while obtaining psychiatric consultation and evaluation, as well as ongoing medical and medication management to treat the patient's condition.  The patient has been placed under full IVC at this time.       ____________________________________________   FINAL CLINICAL IMPRESSION(S) / ED DIAGNOSES    Final diagnoses:  Paranoia ([place])  Dementia with psychotic disturbance, unspecified dementia severity, unspecified dementia type     ED Discharge Orders     None       Portions of this note were generated with dragon dictation software. Dictation errors may occur despite best attempts at proofreading.   Carrie Mew, MD 01/29/21 [phone]

## 2021-01-30 DIAGNOSIS — F22 Delusional disorders: Secondary | ICD-10-CM | POA: Insufficient documentation

## 2021-01-30 DIAGNOSIS — F251 Schizoaffective disorder, depressive type: Secondary | ICD-10-CM

## 2021-01-30 DIAGNOSIS — R410 Disorientation, unspecified: Secondary | ICD-10-CM

## 2021-01-30 DIAGNOSIS — F0392 Unspecified dementia, unspecified severity, with psychotic disturbance: Secondary | ICD-10-CM | POA: Insufficient documentation

## 2021-01-30 LAB — URINALYSIS, ROUTINE W REFLEX MICROSCOPIC
Bacteria, UA: NONE SEEN
Bilirubin Urine: NEGATIVE
Glucose, UA: NEGATIVE mg/dL
Ketones, ur: 15 mg/dL — AB
Leukocytes,Ua: NEGATIVE
Nitrite: NEGATIVE
Protein, ur: 100 mg/dL — AB
Specific Gravity, Urine: >1.03 — ABNORMAL HIGH (ref 1.005–1.030)
pH: 5.5 (ref 5.0–8.0)

## 2021-01-30 LAB — CBC WITH DIFFERENTIAL/PLATELET
Abs Immature Granulocytes: 0.05 10*3/uL (ref 0.00–0.07)
Basophils Absolute: 0 10*3/uL (ref 0.0–0.1)
Basophils Relative: 0 %
Eosinophils Absolute: 0 10*3/uL (ref 0.0–0.5)
Eosinophils Relative: 0 %
HCT: 38.8 % (ref 36.0–46.0)
Hemoglobin: 11.8 g/dL — ABNORMAL LOW (ref 12.0–15.0)
Immature Granulocytes: 1 %
Lymphocytes Relative: 14 %
Lymphs Abs: 1.5 10*3/uL (ref 0.7–4.0)
MCH: 22 pg — ABNORMAL LOW (ref 26.0–34.0)
MCHC: 30.4 g/dL (ref 30.0–36.0)
MCV: 72.4 fL — ABNORMAL LOW (ref 80.0–100.0)
Monocytes Absolute: 0.6 10*3/uL (ref 0.1–1.0)
Monocytes Relative: 5 %
Neutro Abs: 8.4 10*3/uL — ABNORMAL HIGH (ref 1.7–7.7)
Neutrophils Relative %: 80 %
Platelets: 474 10*3/uL — ABNORMAL HIGH (ref 150–400)
RBC: 5.36 MIL/uL — ABNORMAL HIGH (ref 3.87–5.11)
RDW: 21.1 % — ABNORMAL HIGH (ref 11.5–15.5)
Smear Review: UNDETERMINED
WBC: 10.6 10*3/uL — ABNORMAL HIGH (ref 4.0–10.5)
nRBC: 0 % (ref 0.0–0.2)

## 2021-01-30 LAB — URINE DRUG SCREEN, QUALITATIVE (ARMC ONLY)
Amphetamines, Ur Screen: NOT DETECTED
Barbiturates, Ur Screen: NOT DETECTED
Benzodiazepine, Ur Scrn: POSITIVE — AB
Cannabinoid 50 Ng, Ur ~~LOC~~: NOT DETECTED
Cocaine Metabolite,Ur ~~LOC~~: NOT DETECTED
MDMA (Ecstasy)Ur Screen: NOT DETECTED
Methadone Scn, Ur: NOT DETECTED
Opiate, Ur Screen: POSITIVE — AB
Phencyclidine (PCP) Ur S: NOT DETECTED
Tricyclic, Ur Screen: NOT DETECTED

## 2021-01-30 LAB — RESP PANEL BY RT-PCR (FLU A&B, COVID) ARPGX2
Influenza A by PCR: NEGATIVE
Influenza B by PCR: NEGATIVE
SARS Coronavirus 2 by RT PCR: NEGATIVE

## 2021-01-30 LAB — SALICYLATE LEVEL: Salicylate Lvl: <7 mg/dL — ABNORMAL LOW (ref 7.0–30.0)

## 2021-01-30 MED ORDER — ROSUVASTATIN CALCIUM 20 MG PO TABS
20.0000 mg | ORAL_TABLET | Freq: Every day | ORAL | Status: DC
  Administered 2021-01-30 – 2021-01-31 (×2): 20 mg via ORAL
  Filled 2021-01-30 (×3): qty 1

## 2021-01-30 MED ORDER — AMLODIPINE BESYLATE 5 MG PO TABS
10.0000 mg | ORAL_TABLET | Freq: Every day | ORAL | Status: DC
  Administered 2021-01-30 – 2021-02-01 (×3): 10 mg via ORAL
  Filled 2021-01-30 (×3): qty 2

## 2021-01-30 MED ORDER — PANTOPRAZOLE SODIUM 40 MG PO TBEC
40.0000 mg | DELAYED_RELEASE_TABLET | Freq: Every day | ORAL | Status: DC
  Administered 2021-01-30 – 2021-02-01 (×3): 40 mg via ORAL
  Filled 2021-01-30 (×3): qty 1

## 2021-01-30 MED ORDER — PROPRANOLOL HCL ER 60 MG PO CP24
60.0000 mg | ORAL_CAPSULE | Freq: Every day | ORAL | Status: DC
  Administered 2021-01-30 – 2021-02-01 (×3): 60 mg via ORAL
  Filled 2021-01-30 (×3): qty 1

## 2021-01-30 MED ORDER — ASPIRIN EC 325 MG PO TBEC
325.0000 mg | DELAYED_RELEASE_TABLET | Freq: Every day | ORAL | Status: DC
  Administered 2021-01-30 – 2021-02-01 (×3): 325 mg via ORAL
  Filled 2021-01-30 (×3): qty 1

## 2021-01-30 MED ORDER — DONEPEZIL HCL 5 MG PO TABS
10.0000 mg | ORAL_TABLET | Freq: Every day | ORAL | Status: DC
  Administered 2021-01-30 – 2021-01-31 (×2): 10 mg via ORAL
  Filled 2021-01-30 (×2): qty 2

## 2021-01-30 MED ORDER — SERTRALINE HCL 50 MG PO TABS
100.0000 mg | ORAL_TABLET | Freq: Every day | ORAL | Status: DC
  Administered 2021-01-30 – 2021-02-01 (×3): 100 mg via ORAL
  Filled 2021-01-30 (×3): qty 2

## 2021-01-30 MED ORDER — CLOBAZAM 10 MG PO TABS
5.0000 mg | ORAL_TABLET | Freq: Every day | ORAL | Status: DC
  Administered 2021-01-30 – 2021-01-31 (×2): 5 mg via ORAL
  Filled 2021-01-30 (×2): qty 1

## 2021-01-30 MED ORDER — QUETIAPINE FUMARATE ER 50 MG PO TB24
50.0000 mg | ORAL_TABLET | Freq: Every day | ORAL | Status: DC
  Administered 2021-01-30 – 2021-01-31 (×2): 50 mg via ORAL
  Filled 2021-01-30 (×4): qty 1

## 2021-01-30 MED ORDER — CLONIDINE HCL 0.1 MG PO TABS
0.1000 mg | ORAL_TABLET | Freq: Two times a day (BID) | ORAL | Status: DC
  Administered 2021-01-30 – 2021-02-01 (×5): 0.1 mg via ORAL
  Filled 2021-01-30 (×5): qty 1

## 2021-01-30 MED ORDER — VITAMIN B-12 1000 MCG PO TABS
1000.0000 ug | ORAL_TABLET | Freq: Every day | ORAL | Status: DC
  Administered 2021-01-31 – 2021-02-01 (×2): 1000 ug via ORAL
  Filled 2021-01-30 (×2): qty 1

## 2021-01-30 MED ORDER — LAMOTRIGINE 25 MG PO TABS
25.0000 mg | ORAL_TABLET | Freq: Every evening | ORAL | Status: DC
  Administered 2021-01-30 – 2021-01-31 (×2): 25 mg via ORAL
  Filled 2021-01-30 (×2): qty 1

## 2021-01-30 MED ORDER — LAMOTRIGINE 100 MG PO TABS
100.0000 mg | ORAL_TABLET | Freq: Two times a day (BID) | ORAL | Status: DC
  Administered 2021-01-30 – 2021-02-01 (×4): 100 mg via ORAL
  Filled 2021-01-30 (×4): qty 1

## 2021-01-30 MED ORDER — VITAMIN D3 25 MCG (1000 UNIT) PO TABS
5000.0000 [IU] | ORAL_TABLET | Freq: Every morning | ORAL | Status: DC
  Administered 2021-01-31 – 2021-02-01 (×2): 5000 [IU] via ORAL
  Filled 2021-01-30 (×3): qty 5

## 2021-01-30 MED ORDER — HYDROCODONE-ACETAMINOPHEN 5-325 MG PO TABS
1.0000 | ORAL_TABLET | Freq: Four times a day (QID) | ORAL | Status: DC | PRN
Start: 2021-01-30 — End: 2021-02-01

## 2021-01-30 MED ORDER — TRAZODONE HCL 100 MG PO TABS: 100.0000 mg | ORAL_TABLET | Freq: Every evening | ORAL | Status: DC | PRN

## 2021-01-30 NOTE — ED Notes (Signed)
Pt clean and dry, on purewick.

## 2021-01-30 NOTE — ED Notes (Signed)
IVC/Psych Consult ordered

## 2021-01-30 NOTE — ED Notes (Signed)
Pt refusing IV haldol and COVID swab

## 2021-01-30 NOTE — Consult Note (Signed)
[place] Psychiatry Consult   Reason for Consult:  Psychiatric consult Referring Physician:  Dr. Joni Fears Patient Identification: SIREN PORRATA MRN:  [id number] Principal Diagnosis: Schizoaffective disorder [facility]) Diagnosis:  Principal Problem:   Schizoaffective disorder ([place]) Active Problems:   Confusion   Total Time spent with patient: 1 hour  Subjective:   Per triage nurse [name] is a 65 y.o. female patient arrives via [facility] from [facility] with CC of altered mental status. Staff reports pt has not been taking schizophrenia medications and has been hallucinating. Pt is suspicious and has positive flight of ideas when conversing.Marland Kitchen  HPI:  Patient was seen by this provider and chart reviewed and discussed with Dr, Joni Fears. Patient is actively yelling on approach. She is screaming "dont hurt my baby."  She is also observed responding to internal stimuli and attempting to get out of bed.  Patient is not a good historian at this time and had to be medicated with haldol 5mg  iv push. Per ER doctor,  KAELEIGH WESTENDORF is a 65 y.o. female a past history of schizoaffective disorder, dementia who is sent [facility] at the request of her son for psychiatric evaluation.  Per EMS, patient is having increased paranoia and disorganized thoughts over the past few days.  She has been noted to be walking on her right leg which recently sustained a fracture.  She had ORIF of right ankle fracture is 10 days ago, and on discharge from the hospital was intended to be nonweightbearing on the right leg.    Past Psychiatric History: Schizoaffective disorder  Risk to Self:   Risk to Others:   Prior Inpatient Therapy:   Prior Outpatient Therapy:    Past Medical History:  Past Medical History:  Diagnosis Date   Anxiety    Arthritis    "all over"   Chronic lower back pain    Depression    GERD (gastroesophageal reflux disease)    Headache    "weekly" (07/15/2015)    Hyperlipidemia    Hypertension    Mini stroke    "several since 05/2014" (07/15/2015)   Seizures ([place]) dx'd 04/2015    Past Surgical History:  Procedure Laterality Date   DILATION AND CURETTAGE OF UTERUS     LUMBAR PUNCTURE  07/14/2015   ORIF ANKLE FRACTURE Right 01/20/2021   Procedure: OPEN REDUCTION INTERNAL FIXATION (ORIF) ANKLE FRACTURE;  Surgeon: Corky Mull, MD;  Location: [facility] ORS;  Service: Orthopedics;  Laterality: Right;   TUBAL LIGATION     VAGINAL HYSTERECTOMY     Family History:  Family History  Problem Relation Age of Onset   Hyperlipidemia Mother    Breast cancer Mother 71   Hypertension Mother    Heart attack Mother        age 61's   Hyperlipidemia Father    Hypertension Father    Heart attack Father 17   Breast cancer Sister 10   Family Psychiatric  History: unknown Social History:  Social History   Substance and Sexual Activity  Alcohol Use Not Currently   Comment: 07/15/2015 "nothing in the last few years"     Social History   Substance and Sexual Activity  Drug Use No    Social History   Socioeconomic History   Marital status: Single    Spouse name: Not on file   Number of children: 1   Years of education: Not on file   Highest education level: Not on file  Occupational History   Occupation: disabled  Tobacco Use   Smoking status: Former    Packs/day: 1.00    Years: 5.00    Pack years: 5.00    Types: Cigarettes   Smokeless tobacco: Never   Tobacco comments:    "quit smoking cigarettes in the early 2000s"  Substance and Sexual Activity   Alcohol use: Not Currently    Comment: 07/15/2015 "nothing in the last few years"   Drug use: No   Sexual activity: Not Currently  Other Topics Concern   Not on file  Social History Narrative   Lives with significant other, has son who comes by daily.   Social Determinants of Health   Financial Resource Strain: Not on file  Food Insecurity: Not on file  Transportation Needs: Not on file   Physical Activity: Not on file  Stress: Not on file  Social Connections: Not on file   Additional Social History:    Allergies:   Allergies  Allergen Reactions   Lovastatin Other (See Comments)    Unknown reaction    Labs:  Results for orders placed or performed during the hospital encounter of 01/29/21 (from the past 48 hour(s))  POC CBG, ED     Status: Abnormal   Collection Time: 01/29/21 10:53 PM  Result Value Ref Range   Glucose-Capillary 113 (H) 70 - 99 mg/dL    Comment: Glucose reference range applies only to samples taken after fasting for at least 8 hours.  Acetaminophen level     Status: Abnormal   Collection Time: 01/29/21 10:55 PM  Result Value Ref Range   Acetaminophen (Tylenol), Serum <10 (L) 10 - 30 ug/mL    Comment: (NOTE) Therapeutic concentrations vary significantly. A range of 10-30 ug/mL  may be an effective concentration for many patients. However, some  are best treated at concentrations outside of this range. Acetaminophen concentrations >150 ug/mL at 4 hours after ingestion  and >50 ug/mL at 12 hours after ingestion are often associated with  toxic reactions.  Performed at [facility], [address] [place], [place] [address]   Comprehensive metabolic panel     Status: Abnormal   Collection Time: 01/29/21 10:55 PM  Result Value Ref Range   Sodium 136 135 - 145 mmol/L   Potassium 4.3 3.5 - 5.1 mmol/L   Chloride 96 (L) 98 - 111 mmol/L   CO2 26 22 - 32 mmol/L   Glucose, Bld 110 (H) 70 - 99 mg/dL    Comment: Glucose reference range applies only to samples taken after fasting for at least 8 hours.   BUN 22 8 - 23 mg/dL   Creatinine, Ser 0.92 0.44 - 1.00 mg/dL   Calcium 10.4 (H) 8.9 - 10.3 mg/dL   Total Protein 9.5 (H) 6.5 - 8.1 g/dL   Albumin 3.8 3.5 - 5.0 g/dL   AST 41 15 - 41 U/L   ALT 46 (H) 0 - 44 U/L   Alkaline Phosphatase 121 38 - 126 U/L   Total Bilirubin 0.9 0.3 - 1.2 mg/dL   GFR, Estimated >60 >60 mL/min    Comment:  (NOTE) Calculated using the CKD-EPI Creatinine Equation (2021)    Anion gap 14 5 - 15    Comment: Performed at [facility], [address] [place], [place] [address]  Ethanol     Status: None   Collection Time: 01/29/21 10:55 PM  Result Value Ref Range   Alcohol, Ethyl (B) <10 <10 mg/dL    Comment: (  NOTE) Lowest detectable limit for serum alcohol is 10 mg/dL.  For medical purposes only. Performed at [facility], [address] [place], [place] [address]   Lipase, blood     Status: None   Collection Time: 01/29/21 10:55 PM  Result Value Ref Range   Lipase 46 11 - 51 U/L    Comment: Performed at [facility], [address] [place], [place] [address]  CBC with Differential     Status: Abnormal   Collection Time: 01/29/21 10:55 PM  Result Value Ref Range   WBC 10.6 (H) 4.0 - 10.5 K/uL   RBC 5.36 (H) 3.87 - 5.11 MIL/uL   Hemoglobin 11.8 (L) 12.0 - 15.0 g/dL   HCT 38.8 36.0 - 46.0 %   MCV 72.4 (L) 80.0 - 100.0 fL   MCH 22.0 (L) 26.0 - 34.0 pg   MCHC 30.4 30.0 - 36.0 g/dL   RDW 21.1 (H) 11.5 - 15.5 %   Platelets 474 (H) 150 - 400 K/uL   nRBC 0.0 0.0 - 0.2 %   Neutrophils Relative % 80 %   Neutro Abs 8.4 (H) 1.7 - 7.7 K/uL   Lymphocytes Relative 14 %   Lymphs Abs 1.5 0.7 - 4.0 K/uL   Monocytes Relative 5 %   Monocytes Absolute 0.6 0.1 - 1.0 K/uL   Eosinophils Relative 0 %   Eosinophils Absolute 0.0 0.0 - 0.5 K/uL   Basophils Relative 0 %   Basophils Absolute 0.0 0.0 - 0.1 K/uL   WBC Morphology MORPHOLOGY UNREMARKABLE    RBC Morphology MORPHOLOGY UNREMARKABLE    Smear Review PLATELET CLUMPS NOTED ON SMEAR, UNABLE TO ESTIMATE    Immature Granulocytes 1 %   Abs Immature Granulocytes 0.05 0.00 - 0.07 K/uL    Comment: Performed at [facility], [address] [place], [place] [address]  Urinalysis, Routine w reflex microscopic     Status: Abnormal   Collection Time: 01/29/21 10:59 PM  Result Value Ref Range   Color, Urine YELLOW  YELLOW   APPearance CLEAR (A) CLEAR   Specific Gravity, Urine >1.030 (H) 1.005 - 1.030   pH 5.5 5.0 - 8.0   Glucose, UA NEGATIVE NEGATIVE mg/dL   Hgb urine dipstick TRACE (A) NEGATIVE   Bilirubin Urine NEGATIVE NEGATIVE   Ketones, ur 15 (A) NEGATIVE mg/dL   Protein, ur 100 (A) NEGATIVE mg/dL   Nitrite NEGATIVE NEGATIVE   Leukocytes,Ua NEGATIVE NEGATIVE   RBC / HPF 0-5 0 - 5 RBC/hpf   WBC, UA 0-5 0 - 5 WBC/hpf   Bacteria, UA NONE SEEN NONE SEEN   Squamous Epithelial / LPF 0-5 0 - 5   Mucus PRESENT    Hyaline Casts, UA PRESENT     Comment: Performed at [facility], [address] [place], [place] [address]  Urine Drug Screen, Qualitative     Status: Abnormal   Collection Time: 01/29/21 10:59 PM  Result Value Ref Range   Tricyclic, Ur Screen NONE DETECTED NONE DETECTED   Amphetamines, Ur Screen NONE DETECTED NONE DETECTED   MDMA (Ecstasy)Ur Screen NONE DETECTED NONE DETECTED   Cocaine Metabolite,Ur [place] NONE DETECTED NONE DETECTED   Opiate, Ur Screen POSITIVE (A) NONE DETECTED   Phencyclidine (PCP) Ur S NONE DETECTED NONE DETECTED   Cannabinoid 50 Ng, Ur [place] NONE DETECTED NONE DETECTED   Barbiturates, Ur Screen NONE DETECTED NONE DETECTED   Benzodiazepine, Ur Scrn POSITIVE (A) NONE DETECTED   Methadone Scn, Ur NONE DETECTED NONE DETECTED  Comment: (NOTE) Tricyclics + metabolites, urine    Cutoff 1000 ng/mL Amphetamines + metabolites, urine  Cutoff 1000 ng/mL MDMA (Ecstasy), urine              Cutoff 500 ng/mL Cocaine Metabolite, urine          Cutoff 300 ng/mL Opiate + metabolites, urine        Cutoff 300 ng/mL Phencyclidine (PCP), urine         Cutoff 25 ng/mL Cannabinoid, urine                 Cutoff 50 ng/mL Barbiturates + metabolites, urine  Cutoff 200 ng/mL Benzodiazepine, urine              Cutoff 200 ng/mL Methadone, urine                   Cutoff 300 ng/mL  The urine drug screen provides only a preliminary, unconfirmed analytical test result and should not  be used for non-medical purposes. Clinical consideration and professional judgment should be applied to any positive drug screen result due to possible interfering substances. A more specific alternate chemical method must be used in order to obtain a confirmed analytical result. Gas chromatography / mass spectrometry (GC/MS) is the preferred confirm atory method. Performed at [facility], [address] [place], [place] [address]   Salicylate level     Status: Abnormal   Collection Time: 01/30/21 12:05 AM  Result Value Ref Range   Salicylate Lvl <1.6 (L) 7.0 - 30.0 mg/dL    Comment: Performed at [facility], [address] [place], [place] [address]    Current Facility-Administered Medications  Medication Dose Route Frequency Provider Last Rate Last Admin   traZODone (DESYREL) tablet 100 mg  100 mg Oral QHS PRN Deloria Lair, NP       Current Outpatient Medications  Medication Sig Dispense Refill   amLODipine (NORVASC) 10 MG tablet Take 10 mg by mouth daily. For high blood pressure     aspirin EC 325 MG tablet Take 1 tablet (325 mg total) by mouth daily. 30 tablet 0   benzonatate (TESSALON) 100 MG capsule Take 1 capsule (100 mg total) by mouth 3 (three) times daily. 20 capsule 0   Cholecalciferol (VITAMIN D3) 125 MCG (5000 UT) TABS Take 1 tablet (5,000 Units total) by mouth every morning. 30 tablet 0   cloBAZam (ONFI) 10 MG tablet Take 0.5 tablets (5 mg total) by mouth at bedtime. 30 tablet 1   cloNIDine (CATAPRES) 0.1 MG tablet Take 1 tablet (0.1 mg total) by mouth 2 (two) times daily. 60 tablet 11   donepezil (ARICEPT) 10 MG tablet Take 10 mg by mouth at bedtime.     HYDROcodone-acetaminophen (NORCO/VICODIN) 5-325 MG tablet Take 1-2 tablets by mouth every 6 (six) hours as needed for moderate pain. 40 tablet 0   lamoTRIgine (LAMICTAL) 100 MG tablet 100 mg in morning and 125 mg in evening. 60 tablet 0   lamoTRIgine (LAMICTAL) 25 MG tablet Take 1 tablet (25  mg total) by mouth every evening. Along with 100 mg tablet for total of 125 mg in evening. 60 tablet 0   OZEMPIC, 0.25 OR 0.5 MG/DOSE, 2 MG/1.5ML SOPN Inject 0.25 mg into the skin once a week.     pantoprazole (PROTONIX) 40 MG tablet Take 1 tablet (40 mg total) by mouth daily. 30 tablet 11   propranolol ER (INDERAL LA) 60 MG 24 hr capsule Take 60 mg  by mouth daily.  11   QUEtiapine (SEROQUEL XR) 50 MG TB24 24 hr tablet Take 50 mg by mouth at bedtime.     rosuvastatin (CRESTOR) 20 MG tablet Take 20 mg by mouth at bedtime.     sertraline (ZOLOFT) 100 MG tablet Take 100 mg by mouth daily.  2   vitamin B-12 (CYANOCOBALAMIN) 1000 MCG tablet Take 1,000 mcg by mouth daily.      Musculoskeletal: Strength & Muscle Tone: within normal limits Gait & Station: unsteady Patient leans: N/A            Psychiatric Specialty Exam:  Presentation  General Appearance: Bizarre; Disheveled  Eye Contact:Fair  Speech:Pressured; Clear and Coherent  Speech Volume:Decreased  Handedness:Right   Mood and Affect  Mood:Anxious; Angry; Irritable; Labile  Affect:Inappropriate   Thought Process  Thought Processes:Disorganized  Descriptions of Associations:Tangential  Orientation:Partial  Thought Content:Delusions; Illogical; Tangential  History of Schizophrenia/Schizoaffective disorder:Yes  Duration of Psychotic Symptoms:Greater than six months  Hallucinations:Hallucinations: Command; Auditory  Ideas of Reference:Paranoia  Suicidal Thoughts:Suicidal Thoughts: No (unable to assess)  Homicidal Thoughts:Homicidal Thoughts: -- (unable to assess)   Sensorium  Memory:No data recorded Judgment:Impaired  Insight:No data recorded  Executive Functions  Concentration:Poor  Attention Span:Poor  Recall:Poor  Fund of Knowledge:Poor  Language:Poor   Psychomotor Activity  Psychomotor Activity:Psychomotor Activity: Normal Extrapyramidal Side Effects (EPS): Parkinsonism; Tardive  Dyskinesia   Assets  Assets:Communication Skills   Sleep  Sleep:Sleep: -- (unable to assess)   Physical Exam: Physical Exam Vitals and nursing note reviewed.  HENT:     Head: Normocephalic and atraumatic.     Nose: Nose normal.     Mouth/Throat:     Mouth: Mucous membranes are moist.  Eyes:     Pupils: Pupils are equal, round, and reactive to light.  Pulmonary:     Effort: Pulmonary effort is normal.  Musculoskeletal:        General: Normal range of motion.     Cervical back: Normal range of motion.  Skin:    General: Skin is warm and dry.  Neurological:     Mental Status: She is alert. She is disoriented.  Psychiatric:        Attention and Perception: She is inattentive.        Mood and Affect: Mood is anxious and elated. Affect is labile and inappropriate.        Speech: Speech is rapid and pressured and tangential.        Behavior: Behavior is agitated and combative.        Thought Content: Thought content normal.        Cognition and Memory: Cognition is impaired. Memory is impaired. She exhibits impaired recent memory and impaired remote memory.        Judgment: Judgment is impulsive and inappropriate.   Review of Systems  Psychiatric/Behavioral:  Positive for hallucinations and memory loss.   All other systems reviewed and are negative. Blood pressure (!) 198/86, pulse 96, temperature 97.7 F (36.5 C), temperature source Oral, resp. rate (!) 28, height 5\' 2"  (1.575 m), weight 81.6 kg, SpO2 95 %. Body mass index is 32.92 kg/m.  Treatment Plan Summary: Daily contact with patient to assess and evaluate symptoms and progress in treatment, Medication management, and Plan Admit to psych unit once medically cleared  Disposition: Recommend psychiatric Inpatient admission when medically cleared. Supportive therapy provided about ongoing stressors. Discussed crisis plan, support from social network, calling 911, coming to the Emergency Department, and calling Suicide  Hotline.  Deloria Lair, NP 01/30/2021 6:24 AM

## 2021-01-30 NOTE — BH Assessment (Addendum)
Adult/GERO MH  Referral information for Psychiatric Hospitalization faxed to:   [facility] ([phone])   [facility] [phone]- [phone], [phone])   [facility] [phone]), No female unit   [facility] [phone] or [phone]),  . [facility] [phone] -or- [phone]),

## 2021-01-31 DIAGNOSIS — F259 Schizoaffective disorder, unspecified: Secondary | ICD-10-CM

## 2021-01-31 NOTE — ED Notes (Signed)
Dinner tray given

## 2021-01-31 NOTE — ED Notes (Signed)
Pt resting at RN Shift change round assessment. No signs/symptoms of distress noted, will cont to monitor as ordered.

## 2021-01-31 NOTE — ED Notes (Signed)
Pt's niece informed staff that Ms [place] has not received assistance with ADL's (shower) since arrival.  Staff informed family that upon request by the patient this would be taken care of

## 2021-01-31 NOTE — BH Assessment (Signed)
Referral checks:    [facility] (LVDI-[phone]) No answer, no behavioral health staff until after after [facility] [phone]- [phone], [phone]) No answer, no behavioral health staff until after [facility] [phone]), No female unit    [facility] [phone] or [phone] behavioral health staff until after [place] [phone] -or- [phone]), Izola Price reports denied due to dementia

## 2021-01-31 NOTE — Consult Note (Addendum)
[facility] Consult   Reason for Consult: Altered mental status Referring Physician: EDP Patient Identification: Sandra Charles MRN:  [id number] Principal Diagnosis: Schizoaffective disorder [facility]) Diagnosis:  Principal Problem:   Schizoaffective disorder ([place]) Active Problems:   Confusion   Total Time spent with patient: 20 minutes  Subjective:   Sandra Charles is a 65 y.o. female patient admitted with altered mental status.  HPI: See prior consult note.  Patient seen face-to-face today and chart reviewed. Patient has been taking her medications as prescribed while here in the ED and has not had any  episodes of agitated behavior for over 24 hours.  She states that she is still a little confused about what happened to her leg regarding the need for rehabilitation.  She is alert to place, self .  She is aware that she is at [facility].  Explained to patient what happened regarding her fall and subsequent ankle fracture that she had repaired and was sent to rehabilitation facility, and that she was off her meds for some time and became confused.  Patient states that she does take medications for her mood.  She smiles during conversation, states that she is not in any pain.  Patient denies auditory or visual hallucinations.  He does not appear to be responding to internal stimuli.  She is pleasant and cooperative.  Denies suicidal or homicidal ideations or paranoia.  Explained to patient that since her mood is stable, and her mental status is not altered at this point she can return to the rehab facility to complete her rehabilitation prior to being discharged home.  Patient is coherent and expresses understanding.  Patient's surgery and subsequently moving to rehab facility instead of going home, and being off of her psychiatric medications, may have contributed to her confusion.  Patient does not currently meet criteria for inpatient psychiatric hospitalization.  Past Psychiatric  History: Schizoaffective disorder  Risk to Self:   Risk to Others:   Prior Inpatient Therapy:   Prior Outpatient Therapy:    Past Medical History:  Past Medical History:  Diagnosis Date   Anxiety    Arthritis    "all over"   Chronic lower back pain    Depression    GERD (gastroesophageal reflux disease)    Headache    "weekly" (07/15/2015)   Hyperlipidemia    Hypertension    Mini stroke    "several since 05/2014" (07/15/2015)   Seizures ([place]) dx'd 04/2015    Past Surgical History:  Procedure Laterality Date   DILATION AND CURETTAGE OF UTERUS     LUMBAR PUNCTURE  07/14/2015   ORIF ANKLE FRACTURE Right 01/20/2021   Procedure: OPEN REDUCTION INTERNAL FIXATION (ORIF) ANKLE FRACTURE;  Surgeon: Corky Mull, MD;  Location: [facility] ORS;  Service: Orthopedics;  Laterality: Right;   TUBAL LIGATION     VAGINAL HYSTERECTOMY     Family History:  Family History  Problem Relation Age of Onset   Hyperlipidemia Mother    Breast cancer Mother 63   Hypertension Mother    Heart attack Mother        age 63's   Hyperlipidemia Father    Hypertension Father    Heart attack Father 32   Breast cancer Sister 33   Family Psychiatric  History: Unknown Social History:  Social History   Substance and Sexual Activity  Alcohol Use Not Currently   Comment: 07/15/2015 "nothing in the last few years"     Social History   Substance  and Sexual Activity  Drug Use No    Social History   Socioeconomic History   Marital status: Single    Spouse name: Not on file   Number of children: 1   Years of education: Not on file   Highest education level: Not on file  Occupational History   Occupation: disabled  Tobacco Use   Smoking status: Former    Packs/day: 1.00    Years: 5.00    Pack years: 5.00    Types: Cigarettes   Smokeless tobacco: Never   Tobacco comments:    "quit smoking cigarettes in the early 2000s"  Substance and Sexual Activity   Alcohol use: Not Currently    Comment: 07/15/2015  "nothing in the last few years"   Drug use: No   Sexual activity: Not Currently  Other Topics Concern   Not on file  Social History Narrative   Lives with significant other, has son who comes by daily.   Social Determinants of Health   Financial Resource Strain: Not on file  Food Insecurity: Not on file  Transportation Needs: Not on file  Physical Activity: Not on file  Stress: Not on file  Social Connections: Not on file   Additional Social History:    Allergies:   Allergies  Allergen Reactions   Lovastatin Other (See Comments)    Unknown reaction    Labs:  Results for orders placed or performed during the hospital encounter of 01/29/21 (from the past 48 hour(s))  Resp Panel by RT-PCR (Flu A&B, Covid) Nasopharyngeal Swab     Status: None   Collection Time: 01/29/21  6:09 PM   Specimen: Nasopharyngeal Swab; Nasopharyngeal(NP) swabs in vial transport medium  Result Value Ref [place]   SARS Coronavirus 2 by RT PCR NEGATIVE NEGATIVE    Comment: (NOTE) SARS-CoV-2 target nucleic acids are NOT DETECTED.  The SARS-CoV-2 RNA is generally detectable in upper respiratory specimens during the acute phase of infection. The lowest concentration of SARS-CoV-2 viral copies this assay can detect is 138 copies/mL. A negative result does not preclude SARS-Cov-2 infection and should not be used as the sole basis for treatment or other patient management decisions. A negative result may occur with  improper specimen collection/handling, submission of specimen other than nasopharyngeal swab, presence of viral mutation(s) within the areas targeted by this assay, and inadequate number of viral copies(<138 copies/mL). A negative result must be combined with clinical observations, patient history, and epidemiological information. The expected result is Negative.  Fact Sheet for Patients:  EntrepreneurPulse.com.au  Fact Sheet for Healthcare Providers:   IncredibleEmployment.be  This test is no t yet approved or cleared by the [place] FDA and  has been authorized for detection and/or diagnosis of SARS-CoV-2 by FDA under an Emergency Use Authorization (EUA). This EUA will remain  in effect (meaning this test can be used) for the duration of the COVID-19 declaration under Section 564(b)(1) of the Act, 21 U.S.C.section 360bbb-3(b)(1), unless the authorization is terminated  or revoked sooner.       Influenza A by PCR NEGATIVE NEGATIVE   Influenza B by PCR NEGATIVE NEGATIVE    Comment: (NOTE) The Xpert Xpress SARS-CoV-2/FLU/RSV plus assay is intended as an aid in the diagnosis of influenza from Nasopharyngeal swab specimens and should not be used as a sole basis for treatment. Nasal washings and aspirates are unacceptable for Xpert Xpress SARS-CoV-2/FLU/RSV testing.  Fact Sheet for Patients: EntrepreneurPulse.com.au  Fact Sheet for Healthcare Providers: IncredibleEmployment.be  This test is not  yet approved or cleared by the [place] and has been authorized for detection and/or diagnosis of SARS-CoV-2 by FDA under an Emergency Use Authorization (EUA). This EUA will remain in effect (meaning this test can be used) for the duration of the COVID-19 declaration under Section 564(b)(1) of the Act, 21 U.S.C. section 360bbb-3(b)(1), unless the authorization is terminated or revoked.  Performed at [facility], [address] [place], [place] [address]   POC CBG, ED     Status: Abnormal   Collection Time: 01/29/21 10:53 PM  Result Value Ref [place]   Glucose-Capillary 113 (H) 70 - 99 mg/dL    Comment: Glucose reference range applies only to samples taken after fasting for at least 8 hours.  Acetaminophen level     Status: Abnormal   Collection Time: 01/29/21 10:55 PM  Result Value Ref [place]   Acetaminophen (Tylenol), Serum <10 (L) 10 - 30 ug/mL     Comment: (NOTE) Therapeutic concentrations vary significantly. A range of 10-30 ug/mL  may be an effective concentration for many patients. However, some  are best treated at concentrations outside of this range. Acetaminophen concentrations >150 ug/mL at 4 hours after ingestion  and >50 ug/mL at 12 hours after ingestion are often associated with  toxic reactions.  Performed at [facility], [address] [place], [place] [address]   Comprehensive metabolic panel     Status: Abnormal   Collection Time: 01/29/21 10:55 PM  Result Value Ref [place]   Sodium 136 135 - 145 mmol/L   Potassium 4.3 3.5 - 5.1 mmol/L   Chloride 96 (L) 98 - 111 mmol/L   CO2 26 22 - 32 mmol/L   Glucose, Bld 110 (H) 70 - 99 mg/dL    Comment: Glucose reference range applies only to samples taken after fasting for at least 8 hours.   BUN 22 8 - 23 mg/dL   Creatinine, Ser 0.92 0.44 - 1.00 mg/dL   Calcium 10.4 (H) 8.9 - 10.3 mg/dL   Total Protein 9.5 (H) 6.5 - 8.1 g/dL   Albumin 3.8 3.5 - 5.0 g/dL   AST 41 15 - 41 U/L   ALT 46 (H) 0 - 44 U/L   Alkaline Phosphatase 121 38 - 126 U/L   Total Bilirubin 0.9 0.3 - 1.2 mg/dL   GFR, Estimated >60 >60 mL/min    Comment: (NOTE) Calculated using the CKD-EPI Creatinine Equation (2021)    Anion gap 14 5 - 15    Comment: Performed at [facility], [address] [place], [place] [address]  Ethanol     Status: None   Collection Time: 01/29/21 10:55 PM  Result Value Ref [place]   Alcohol, Ethyl (B) <10 <10 mg/dL    Comment: (NOTE) Lowest detectable limit for serum alcohol is 10 mg/dL.  For medical purposes only. Performed at [facility], [address] [place], [place] [address]   Lipase, blood     Status: None   Collection Time: 01/29/21 10:55 PM  Result Value Ref [place]   Lipase 46 11 - 51 U/L    Comment: Performed at [facility], [address] [place], [place] [address]  CBC with Differential     Status: Abnormal    Collection Time: 01/29/21 10:55 PM  Result Value Ref [place]   WBC 10.6 (H) 4.0 - 10.5 K/uL   RBC 5.36 (H) 3.87 - 5.11 MIL/uL   Hemoglobin 11.8 (L) 12.0 - 15.0 g/dL   HCT 38.8  36.0 - 46.0 %   MCV 72.4 (L) 80.0 - 100.0 fL   MCH 22.0 (L) 26.0 - 34.0 pg   MCHC 30.4 30.0 - 36.0 g/dL   RDW 21.1 (H) 11.5 - 15.5 %   Platelets 474 (H) 150 - 400 K/uL   nRBC 0.0 0.0 - 0.2 %   Neutrophils Relative % 80 %   Neutro Abs 8.4 (H) 1.7 - 7.7 K/uL   Lymphocytes Relative 14 %   Lymphs Abs 1.5 0.7 - 4.0 K/uL   Monocytes Relative 5 %   Monocytes Absolute 0.6 0.1 - 1.0 K/uL   Eosinophils Relative 0 %   Eosinophils Absolute 0.0 0.0 - 0.5 K/uL   Basophils Relative 0 %   Basophils Absolute 0.0 0.0 - 0.1 K/uL   WBC Morphology MORPHOLOGY UNREMARKABLE    RBC Morphology MORPHOLOGY UNREMARKABLE    Smear Review PLATELET CLUMPS NOTED ON SMEAR, UNABLE TO ESTIMATE    Immature Granulocytes 1 %   Abs Immature Granulocytes 0.05 0.00 - 0.07 K/uL    Comment: Performed at [facility], [address] [place], [place] [address]  Urinalysis, Routine w reflex microscopic     Status: Abnormal   Collection Time: 01/29/21 10:59 PM  Result Value Ref [place]   Color, Urine YELLOW YELLOW   APPearance CLEAR (A) CLEAR   Specific Gravity, Urine >1.030 (H) 1.005 - 1.030   pH 5.5 5.0 - 8.0   Glucose, UA NEGATIVE NEGATIVE mg/dL   Hgb urine dipstick TRACE (A) NEGATIVE   Bilirubin Urine NEGATIVE NEGATIVE   Ketones, ur 15 (A) NEGATIVE mg/dL   Protein, ur 100 (A) NEGATIVE mg/dL   Nitrite NEGATIVE NEGATIVE   Leukocytes,Ua NEGATIVE NEGATIVE   RBC / HPF 0-5 0 - 5 RBC/hpf   WBC, UA 0-5 0 - 5 WBC/hpf   Bacteria, UA NONE SEEN NONE SEEN   Squamous Epithelial / LPF 0-5 0 - 5   Mucus PRESENT    Hyaline Casts, UA PRESENT     Comment: Performed at [facility], [address] [place],  [address]  Urine Drug Screen, Qualitative     Status: Abnormal   Collection Time: 01/29/21 10:59 PM  Result Value Ref [place]    Tricyclic, Ur Screen NONE DETECTED NONE DETECTED   Amphetamines, Ur Screen NONE DETECTED NONE DETECTED   MDMA (Ecstasy)Ur Screen NONE DETECTED NONE DETECTED   Cocaine Metabolite,Ur [place] NONE DETECTED NONE DETECTED   Opiate, Ur Screen POSITIVE (A) NONE DETECTED   Phencyclidine (PCP) Ur S NONE DETECTED NONE DETECTED   Cannabinoid 50 Ng, Ur [place] NONE DETECTED NONE DETECTED   Barbiturates, Ur Screen NONE DETECTED NONE DETECTED   Benzodiazepine, Ur Scrn POSITIVE (A) NONE DETECTED   Methadone Scn, Ur NONE DETECTED NONE DETECTED    Comment: (NOTE) Tricyclics + metabolites, urine    Cutoff 1000 ng/mL Amphetamines + metabolites, urine  Cutoff 1000 ng/mL MDMA (Ecstasy), urine              Cutoff 500 ng/mL Cocaine Metabolite, urine          Cutoff 300 ng/mL Opiate + metabolites, urine        Cutoff 300 ng/mL Phencyclidine (PCP), urine         Cutoff 25 ng/mL Cannabinoid, urine                 Cutoff 50 ng/mL Barbiturates + metabolites, urine  Cutoff 200 ng/mL Benzodiazepine, urine  Cutoff 200 ng/mL Methadone, urine                   Cutoff 300 ng/mL  The urine drug screen provides only a preliminary, unconfirmed analytical test result and should not be used for non-medical purposes. Clinical consideration and professional judgment should be applied to any positive drug screen result due to possible interfering substances. A more specific alternate chemical method must be used in order to obtain a confirmed analytical result. Gas chromatography / mass spectrometry (GC/MS) is the preferred confirm atory method. Performed at [facility], [address] [place], [place] [address]   Salicylate level     Status: Abnormal   Collection Time: 01/30/21 12:05 AM  Result Value Ref [place]   Salicylate Lvl <5.6 (L) 7.0 - 30.0 mg/dL    Comment: Performed at [facility], [address] [place], [place] [address]    Current Facility-Administered Medications  Medication  Dose Route Frequency Provider Last Rate Last Admin   amLODipine (NORVASC) tablet 10 mg  10 mg Oral Daily Blake Divine, MD   10 mg at 01/31/21 1700   aspirin EC tablet 325 mg  325 mg Oral Daily Blake Divine, MD   325 mg at 01/31/21 1749   cholecalciferol (VITAMIN D) tablet 5,000 Units  5,000 Units Oral q morning Blake Divine, MD   5,000 Units at 01/31/21 4496   cloBAZam (ONFI) tablet 5 mg  5 mg Oral QHS Blake Divine, MD   5 mg at 01/30/21 2125   cloNIDine (CATAPRES) tablet 0.1 mg  0.1 mg Oral BID Blake Divine, MD   0.1 mg at 01/31/21 7591   donepezil (ARICEPT) tablet 10 mg  10 mg Oral Thurnell Lose, MD   10 mg at 01/30/21 2125   HYDROcodone-acetaminophen (NORCO/VICODIN) 5-325 MG per tablet 1-2 tablet  1-2 tablet Oral Q6H PRN Blake Divine, MD       lamoTRIgine (LAMICTAL) tablet 100 mg  100 mg Oral BID Blake Divine, MD   100 mg at 01/31/21 6384   lamoTRIgine (LAMICTAL) tablet 25 mg  25 mg Oral QPM Blake Divine, MD   25 mg at 01/30/21 1815   pantoprazole (PROTONIX) EC tablet 40 mg  40 mg Oral Daily Blake Divine, MD   40 mg at 01/31/21 6659   propranolol ER (INDERAL LA) 24 hr capsule 60 mg  60 mg Oral Daily Blake Divine, MD   60 mg at 01/31/21 9357   QUEtiapine (SEROQUEL XR) 24 hr tablet 50 mg  50 mg Oral QHS Blake Divine, MD   50 mg at 01/30/21 2136   rosuvastatin (CRESTOR) tablet 20 mg  20 mg Oral QHS Blake Divine, MD   20 mg at 01/30/21 2136   sertraline (ZOLOFT) tablet 100 mg  100 mg Oral Daily Blake Divine, MD   100 mg at 01/31/21 0177   traZODone (DESYREL) tablet 100 mg  100 mg Oral QHS PRN Deloria Lair, NP       vitamin B-12 (CYANOCOBALAMIN) tablet 1,000 mcg  1,000 mcg Oral Daily Blake Divine, MD   1,000 mcg at 01/31/21 9390   Current Outpatient Medications  Medication Sig Dispense Refill   amLODipine (NORVASC) 10 MG tablet Take 10 mg by mouth daily. For high blood pressure     aspirin EC 325 MG tablet Take 1 tablet (325 mg total) by mouth  daily. 30 tablet 0   benzonatate (TESSALON) 100 MG capsule Take 1 capsule (100 mg total) by mouth 3 (  three) times daily. 20 capsule 0   Cholecalciferol (VITAMIN D3) 125 MCG (5000 UT) TABS Take 1 tablet (5,000 Units total) by mouth every morning. 30 tablet 0   cloBAZam (ONFI) 10 MG tablet Take 0.5 tablets (5 mg total) by mouth at bedtime. 30 tablet 1   cloNIDine (CATAPRES) 0.1 MG tablet Take 1 tablet (0.1 mg total) by mouth 2 (two) times daily. 60 tablet 11   donepezil (ARICEPT) 10 MG tablet Take 10 mg by mouth at bedtime.     HYDROcodone-acetaminophen (NORCO/VICODIN) 5-325 MG tablet Take 1-2 tablets by mouth every 6 (six) hours as needed for moderate pain. 40 tablet 0   lamoTRIgine (LAMICTAL) 100 MG tablet 100 mg in morning and 125 mg in evening. 60 tablet 0   lamoTRIgine (LAMICTAL) 25 MG tablet Take 1 tablet (25 mg total) by mouth every evening. Along with 100 mg tablet for total of 125 mg in evening. 60 tablet 0   OZEMPIC, 0.25 OR 0.5 MG/DOSE, 2 MG/1.5ML SOPN Inject 0.25 mg into the skin once a week.     pantoprazole (PROTONIX) 40 MG tablet Take 1 tablet (40 mg total) by mouth daily. 30 tablet 11   propranolol ER (INDERAL LA) 60 MG 24 hr capsule Take 60 mg by mouth daily.  11   QUEtiapine (SEROQUEL XR) 50 MG TB24 24 hr tablet Take 50 mg by mouth at bedtime.     rosuvastatin (CRESTOR) 20 MG tablet Take 20 mg by mouth at bedtime.     sertraline (ZOLOFT) 100 MG tablet Take 100 mg by mouth daily.  2   vitamin B-12 (CYANOCOBALAMIN) 1000 MCG tablet Take 1,000 mcg by mouth daily.      Musculoskeletal: Strength & Muscle Tone: within normal limits Gait & Station:  Did not observe Patient leans: N/A            Psychiatric Specialty Exam:  Presentation  General Appearance: Appropriate for Environment  Eye Contact:Good  Speech:Clear and Coherent  Speech Volume:Normal  Handedness:Right   Mood and Affect  Mood:Euthymic  Affect:Appropriate   Thought Process  Thought  Processes:Coherent  Descriptions of Associations:Intact  Orientation:Full (Time, Place and Person)  Thought Content:WDL  History of Schizophrenia/Schizoaffective disorder:Yes  Duration of Psychotic Symptoms:Greater than six months  Hallucinations:Hallucinations: None Description of Command Hallucinations: No current hallucinations  Ideas of Reference:None  Suicidal Thoughts:Suicidal Thoughts: No  Homicidal Thoughts:Homicidal Thoughts: No   Sensorium  Memory:Immediate Fair Judgment:Good  Insight:Fair  Executive Functions  Concentration:Fair  Attention Span:Good  [place]   Psychomotor Activity  Psychomotor Activity:WDL   Assets  Assets:Communication Skills   Sleep  Sleep:Sleep: Good   Physical Exam: Physical Exam Vitals and nursing note reviewed.  HENT:     Head: Normocephalic.     Nose: No congestion or rhinorrhea.  Eyes:     General:        Right eye: No discharge.        Left eye: No discharge.  Cardiovascular:     Rate and Rhythm: Normal rate.  Pulmonary:     Effort: Pulmonary effort is normal.  Musculoskeletal:        General: Normal range of motion.     Cervical back: Normal range of motion.  Skin:    General: Skin is dry.  Neurological:     Mental Status: She is alert and oriented to person, place, and time.  Psychiatric:        Attention and Perception: Attention normal.  Mood and Affect: Mood normal.        Behavior: Behavior normal.        Cognition and Memory: She exhibits impaired recent memory.        Judgment: Judgment normal.     Comments: Schizoaffective disorder   Review of Systems  Musculoskeletal:  Positive for falls (Ankle fracture.  Foot is casted).  Psychiatric/Behavioral:  Positive for hallucinations (History of, none at present).   All other systems reviewed and are negative. Blood pressure (!) 131/107, pulse 74, temperature 98.4 F (36.9 C), temperature source  Oral, resp. rate 19, height 5\' 2"  (1.575 m), weight 81.6 kg, SpO2 98 %. Body mass index is 32.92 kg/m.  Treatment Plan Summary: Plan 65 year old female who presented to the ED with altered mental status from rehab facility, where she was placed after surgery on an ankle.  Patient had not been taking her psychiatric medications at the rehab facility.  Patient was reestablished on her medications and her mental status is improved to the point that she may return to the rehab facility to complete her rehabilitation before she goes home.  Discussed with EDP and Dr. Weber Cooks.  Disposition: No evidence of imminent risk to self or others at present.   Supportive therapy provided about ongoing stressors. Discussed crisis plan, support from social network, calling 911, coming to the Emergency Department, and calling Suicide Hotline.  Sherlon Handing, NP 01/31/2021 4:59 PM

## 2021-01-31 NOTE — ED Notes (Signed)
Pt. Has been checked and cleaned up by this writer, Pt. Was placed back on purewick.

## 2021-01-31 NOTE — ED Notes (Signed)
IVC pending placement 

## 2021-01-31 NOTE — ED Notes (Signed)
Hospital meal provided, pt tolerated w/o complaints.  Waste discarded appropriately.  

## 2021-01-31 NOTE — ED Notes (Signed)
Pt is asleep at this time will do snack and VS when awake

## 2021-01-31 NOTE — ED Provider Notes (Signed)
Emergency Medicine Observation Re-evaluation Note  Sandra Charles is a 65 y.o. female, seen on rounds today.  Pt initially presented to the ED for complaints of Altered Mental Status Currently, the patient is resting.  Physical Exam  BP 133/74 (BP Location: Left Arm)   Pulse 96   Temp 99.2 F (37.3 C) (Oral)   Resp 20   Ht 1.575 m (5\' 2" )   Wt 81.6 kg   LMP  (LMP Unknown)   SpO2 97%   BMI 32.92 kg/m  Physical Exam Gen:  No acute distress Resp:  Breathing easily and comfortably, no accessory muscle usage Neuro:  Moving all four extremities, no gross focal neuro deficits Psych:  Resting currently, calm when awake  ED Course / MDM  EKG:   I have reviewed the labs performed to date as well as medications administered while in observation.  Recent changes in the last 24 hours include no significant changes.  Plan  Current plan is for inpatient psychiatric treatment. [name] is under involuntary commitment.      Hinda Kehr, MD 01/31/21 [phone]

## 2021-01-31 NOTE — ED Notes (Signed)
Pt is awake at this time and received a snack and drink

## 2021-01-31 NOTE — ED Notes (Signed)
Pt was changed and given new brief, pad, and purewick

## 2021-01-31 NOTE — ED Notes (Signed)
Staff assisted x2 to aid pt in adjusting in bed.  Pt wanted to verify again why she was in the ED.  She stated that she is having a hard time remembering how she came to the facility. Cont to monitor as ordered

## 2021-01-31 NOTE — ED Notes (Signed)
Pt's son came to visit.  Pt was A/Ox4.  Pt had bright affect w/ good eye contact.  Pt stated "I'm ready to get up and walk around, I'm tired of laying in this bed."

## 2021-02-01 ENCOUNTER — Emergency Department
Admission: EM | Admit: 2021-02-01 | Discharge: 2021-02-06 | Disposition: A | Discharge status: Home / Self Care | Payer: Medicare HMO | Source: Home / Self Care | Attending: Emergency Medicine | Admitting: Emergency Medicine

## 2021-02-01 ENCOUNTER — Other Ambulatory Visit: Payer: Self-pay

## 2021-02-01 DIAGNOSIS — Z139 Encounter for screening, unspecified: Secondary | ICD-10-CM

## 2021-02-01 DIAGNOSIS — F29 Unspecified psychosis not due to a substance or known physiological condition: Secondary | ICD-10-CM | POA: Insufficient documentation

## 2021-02-01 DIAGNOSIS — Z7982 Long term (current) use of aspirin: Secondary | ICD-10-CM | POA: Insufficient documentation

## 2021-02-01 DIAGNOSIS — I10 Essential (primary) hypertension: Secondary | ICD-10-CM | POA: Insufficient documentation

## 2021-02-01 DIAGNOSIS — Z79899 Other long term (current) drug therapy: Secondary | ICD-10-CM | POA: Insufficient documentation

## 2021-02-01 DIAGNOSIS — Z87891 Personal history of nicotine dependence: Secondary | ICD-10-CM | POA: Insufficient documentation

## 2021-02-01 DIAGNOSIS — F0390 Unspecified dementia without behavioral disturbance: Secondary | ICD-10-CM | POA: Insufficient documentation

## 2021-02-01 MED ORDER — PROPRANOLOL HCL ER 60 MG PO CP24
60.0000 mg | ORAL_CAPSULE | Freq: Every day | ORAL | Status: DC
  Administered 2021-02-02 – 2021-02-05 (×4): 60 mg via ORAL
  Filled 2021-02-01 (×5): qty 1

## 2021-02-01 MED ORDER — CLONIDINE HCL 0.1 MG PO TABS
0.1000 mg | ORAL_TABLET | Freq: Two times a day (BID) | ORAL | Status: DC
  Administered 2021-02-01 – 2021-02-06 (×10): 0.1 mg via ORAL
  Filled 2021-02-01 (×10): qty 1

## 2021-02-01 MED ORDER — VITAMIN B-12 1000 MCG PO TABS
1000.0000 ug | ORAL_TABLET | Freq: Every day | ORAL | Status: DC
  Administered 2021-02-02 – 2021-02-06 (×5): 1000 ug via ORAL
  Filled 2021-02-01 (×5): qty 1

## 2021-02-01 MED ORDER — VITAMIN D 25 MCG (1000 UNIT) PO TABS
5000.0000 [IU] | ORAL_TABLET | ORAL | Status: DC
  Administered 2021-02-02 – 2021-02-06 (×5): 5000 [IU] via ORAL
  Filled 2021-02-01 (×5): qty 5

## 2021-02-01 MED ORDER — LAMOTRIGINE 100 MG PO TABS
100.0000 mg | ORAL_TABLET | Freq: Two times a day (BID) | ORAL | Status: DC
  Administered 2021-02-01 – 2021-02-06 (×10): 100 mg via ORAL
  Filled 2021-02-01 (×10): qty 1

## 2021-02-01 MED ORDER — TRAZODONE HCL 100 MG PO TABS: 100.0000 mg | ORAL_TABLET | Freq: Every evening | ORAL | 0 refills | Status: DC | PRN

## 2021-02-01 MED ORDER — SERTRALINE HCL 50 MG PO TABS
100.0000 mg | ORAL_TABLET | Freq: Every day | ORAL | Status: DC
  Administered 2021-02-02 – 2021-02-06 (×5): 100 mg via ORAL
  Filled 2021-02-01 (×5): qty 2

## 2021-02-01 MED ORDER — ASPIRIN EC 325 MG PO TBEC
325.0000 mg | DELAYED_RELEASE_TABLET | Freq: Every day | ORAL | Status: DC
  Administered 2021-02-02 – 2021-02-06 (×5): 325 mg via ORAL
  Filled 2021-02-01 (×5): qty 1

## 2021-02-01 MED ORDER — AMLODIPINE BESYLATE 5 MG PO TABS
10.0000 mg | ORAL_TABLET | Freq: Every day | ORAL | Status: DC
  Administered 2021-02-02 – 2021-02-06 (×5): 10 mg via ORAL
  Filled 2021-02-01 (×5): qty 2

## 2021-02-01 MED ORDER — ROSUVASTATIN CALCIUM 20 MG PO TABS
20.0000 mg | ORAL_TABLET | Freq: Every day | ORAL | Status: DC
  Administered 2021-02-01 – 2021-02-05 (×5): 20 mg via ORAL
  Filled 2021-02-01 (×8): qty 1

## 2021-02-01 MED ORDER — LAMOTRIGINE 25 MG PO TABS
25.0000 mg | ORAL_TABLET | Freq: Every evening | ORAL | Status: DC
  Administered 2021-02-01 – 2021-02-05 (×5): 25 mg via ORAL
  Filled 2021-02-01 (×5): qty 1

## 2021-02-01 MED ORDER — CLOBAZAM 10 MG PO TABS
5.0000 mg | ORAL_TABLET | Freq: Every day | ORAL | Status: DC
  Administered 2021-02-01 – 2021-02-05 (×5): 5 mg via ORAL
  Filled 2021-02-01 (×5): qty 1

## 2021-02-01 MED ORDER — DONEPEZIL HCL 5 MG PO TABS
10.0000 mg | ORAL_TABLET | Freq: Every day | ORAL | Status: DC
  Administered 2021-02-01 – 2021-02-05 (×5): 10 mg via ORAL
  Filled 2021-02-01 (×5): qty 2

## 2021-02-01 MED ORDER — PANTOPRAZOLE SODIUM 40 MG PO TBEC
40.0000 mg | DELAYED_RELEASE_TABLET | Freq: Every day | ORAL | Status: DC
  Administered 2021-02-02 – 2021-02-06 (×5): 40 mg via ORAL
  Filled 2021-02-01 (×5): qty 1

## 2021-02-01 MED ORDER — TRAZODONE HCL 100 MG PO TABS
100.0000 mg | ORAL_TABLET | Freq: Every evening | ORAL | Status: DC | PRN
  Administered 2021-02-02: 100 mg via ORAL
  Filled 2021-02-01: qty 1

## 2021-02-01 MED ORDER — QUETIAPINE FUMARATE ER 50 MG PO TB24
50.0000 mg | ORAL_TABLET | Freq: Every day | ORAL | Status: DC
  Administered 2021-02-01 – 2021-02-05 (×5): 50 mg via ORAL
  Filled 2021-02-01 (×8): qty 1

## 2021-02-01 NOTE — ED Notes (Addendum)
Niece, Lucienne Minks at bedside to visit. Verified phone number listed in chart at [phone]. PT gives permission for information to be released to niece, [name] for hospitalization.  She is requesting to be updated by case management regarding status of insurance authorization. Correct notification sent to [facility] team

## 2021-02-01 NOTE — ED Notes (Signed)
Resumed care from [facility] r[facility].  Pt sleeping.  Purewick in place.

## 2021-02-01 NOTE — ED Notes (Signed)
Pt asleep. Breakfast tray and a cup of juice placed in pt rm.

## 2021-02-01 NOTE — ED Notes (Signed)
Food tray was given with sprite, but patient is sleeping at this time.

## 2021-02-01 NOTE — ED Provider Notes (Signed)
Patient was evaluated by psychiatry today and the IVC was rescinded.  She was deemed stable to be discharged back to her facility.   Rada Hay, MD 02/01/21 [phone]

## 2021-02-01 NOTE — ED Provider Notes (Signed)
[facility]  ____________________________________________   Event Date/Time   First MD Initiated Contact with Patient 02/01/21 1118     (approximate)  I have reviewed the triage vital signs and the nursing notes.   HISTORY  Chief Complaint Placement    HPI Sandra Charles is a 65 y.o. female past medical history of seizures, hypertension, hyperlipidemia, paranoia, dementia with psychotic features who presents because she was unable to be placed back into her rehab facility.  Patient was staying at [facility] under psychiatric assessment and IVC.  She was started back on her psychiatric medications and was deemed stable for discharge today.  She had transport back to her facility but unfortunately she was not accepted because the proper paperwork had not been completed for her insurance which has to be done given she was away from the facility for a period of time.  Patient has no new complaints.         Past Medical History:  Diagnosis Date   Anxiety    Arthritis    "all over"   Chronic lower back pain    Depression    GERD (gastroesophageal reflux disease)    Headache    "weekly" (07/15/2015)   Hyperlipidemia    Hypertension    Mini stroke    "several since 05/2014" (07/15/2015)   Seizures ([place]) dx'd 04/2015    Patient Active Problem List   Diagnosis Date Noted   Paranoia [facility])    Dementia with psychotic disturbance    Hyponatremia 01/25/2021   Trimalleolar fracture of ankle, closed, right, initial encounter 01/19/2021   Accidental fall 01/19/2021   Preoperative clearance 01/19/2021   Seizures ([place]) 08/26/2020   Respiratory failure with hypoxia ([place])    Aspiration into airway    Acute pulmonary edema (HCC)    Tetrahydrocannabinol (THC) dependence ([place])    Sepsis ([place]) 09/22/2018   Altered mental status 03/27/2018   HLD (hyperlipidemia) 10/05/2017   Seizure ([place])    UTI (urinary tract infection) 08/17/2015   Seizure disorder ([place])  08/10/2015   Cannabis use disorder, moderate, dependence ([place]) 08/10/2015   Schizoaffective disorder ([place]) 08/10/2015   Abnormal finding on MRI of brain    Hypokalemia 07/15/2015   Confusion    GERD (gastroesophageal reflux disease) 11/25/2014   Essential hypertension 11/25/2014    Past Surgical History:  Procedure Laterality Date   DILATION AND CURETTAGE OF UTERUS     LUMBAR PUNCTURE  07/14/2015   ORIF ANKLE FRACTURE Right 01/20/2021   Procedure: OPEN REDUCTION INTERNAL FIXATION (ORIF) ANKLE FRACTURE;  Surgeon: Corky Mull, MD;  Location: [facility] ORS;  Service: Orthopedics;  Laterality: Right;   TUBAL LIGATION     VAGINAL HYSTERECTOMY      Prior to Admission medications   Medication Sig Start Date End Date Taking? Authorizing Provider  amLODipine (NORVASC) 10 MG tablet Take 10 mg by mouth daily. For high blood pressure    [provider]  aspirin EC 325 MG tablet Take 1 tablet (325 mg total) by mouth daily. 01/21/21   Lattie Corns, PA-C  benzonatate (TESSALON) 100 MG capsule Take 1 capsule (100 mg total) by mouth 3 (three) times daily. 08/31/20   Lavina Hamman, MD  Cholecalciferol (VITAMIN D3) 125 MCG (5000 UT) TABS Take 1 tablet (5,000 Units total) by mouth every morning. 01/22/21 02/21/21  Antonieta Pert, MD  cloBAZam (ONFI) 10 MG tablet Take 0.5 tablets (5 mg total) by mouth at bedtime. 01/13/21   Ward, Cyril Mourning  N, DO  cloNIDine (CATAPRES) 0.1 MG tablet Take 1 tablet (0.1 mg total) by mouth 2 (two) times daily. 07/15/15   Loletha Grayer, MD  donepezil (ARICEPT) 10 MG tablet Take 10 mg by mouth at bedtime.    [provider]  HYDROcodone-acetaminophen (NORCO/VICODIN) 5-325 MG tablet Take 1-2 tablets by mouth every 6 (six) hours as needed for moderate pain. 01/21/21   Lattie Corns, PA-C  lamoTRIgine (LAMICTAL) 100 MG tablet 100 mg in morning and 125 mg in evening. 08/31/20   Lavina Hamman, MD  lamoTRIgine (LAMICTAL) 25 MG tablet Take 1 tablet (25 mg total)  by mouth every evening. Along with 100 mg tablet for total of 125 mg in evening. 08/31/20   Lavina Hamman, MD  OZEMPIC, 0.25 OR 0.5 MG/DOSE, 2 MG/1.5ML SOPN Inject 0.25 mg into the skin once a week. 01/18/21   [provider]  pantoprazole (PROTONIX) 40 MG tablet Take 1 tablet (40 mg total) by mouth daily. 11/25/14   Wellington [place], MD  propranolol ER (INDERAL LA) 60 MG 24 hr capsule Take 60 mg by mouth daily. 09/15/17   [provider]  QUEtiapine (SEROQUEL XR) 50 MG TB24 24 hr tablet Take 50 mg by mouth at bedtime.    [provider]  rosuvastatin (CRESTOR) 20 MG tablet Take 20 mg by mouth at bedtime. 01/10/21   [provider]  sertraline (ZOLOFT) 100 MG tablet Take 100 mg by mouth daily. 08/30/17   [provider]  traZODone (DESYREL) 100 MG tablet Take 1 tablet (100 mg total) by mouth at bedtime as needed for sleep. 02/01/21 03/03/21  Rada Hay, MD  vitamin B-12 (CYANOCOBALAMIN) 1000 MCG tablet Take 1,000 mcg by mouth daily.    [provider]    Allergies Lovastatin  Family History  Problem Relation Age of Onset   Hyperlipidemia Mother    Breast cancer Mother 49   Hypertension Mother    Heart attack Mother        age 21's   Hyperlipidemia Father    Hypertension Father    Heart attack Father 58   Breast cancer Sister 79    Social History Social History   Tobacco Use   Smoking status: Former    Packs/day: 1.00    Years: 5.00    Pack years: 5.00    Types: Cigarettes   Smokeless tobacco: Never   Tobacco comments:    "quit smoking cigarettes in the early 2000s"  Substance Use Topics   Alcohol use: Not Currently    Comment: 07/15/2015 "nothing in the last few years"   Drug use: No    Review of Systems   Review of Systems  All other systems reviewed and are negative.  Physical Exam Updated Vital Signs BP (!) 148/66   Pulse (!) 58   Temp 97.8 F (36.6 C) (Oral)   Resp 17   Ht 5\' 2"  (1.575 m)   Wt 81 kg    LMP  (LMP Unknown)   SpO2 95%   BMI 32.66 kg/m   Physical Exam Vitals and nursing note reviewed.  Constitutional:      General: She is not in acute distress.    Appearance: Normal appearance.  HENT:     Head: Normocephalic and atraumatic.  Eyes:     General: No scleral icterus.    Conjunctiva/sclera: Conjunctivae normal.  Pulmonary:     Effort: Pulmonary effort is normal. No respiratory distress.     Breath sounds:  No stridor.  Musculoskeletal:        General: No deformity or signs of injury.     Cervical back: Normal range of motion.  Skin:    General: Skin is dry.     Coloration: Skin is not jaundiced or pale.  Neurological:     General: No focal deficit present.     Mental Status: She is alert and oriented to person, place, and time. Mental status is at baseline.  Psychiatric:        Mood and Affect: Mood normal.        Behavior: Behavior normal.     LABS (all labs ordered are listed, but only abnormal results are displayed)  Labs Reviewed - No data to display ____________________________________________  EKG  N/a ____________________________________________  RADIOLOGY Almeta Monas, personally viewed and evaluated these images (plain radiographs) as part of my medical decision making, as well as reviewing the written report by the radiologist.  ED MD interpretation:  n/a    ____________________________________________   PROCEDURES  Procedure(s) performed (including Critical Care):  Procedures   ____________________________________________   INITIAL IMPRESSION / ASSESSMENT AND PLAN / ED COURSE     65 year old female who has been under psychiatric observation and IVC for psychosis here at [facility] discharged about 1 hour ago back to her rehab presents because her insurance paperwork had not been completed.  She returns here with no new complaints.  We will consult social work so that she can return to her facility hopefully today.       ____________________________________________   FINAL CLINICAL IMPRESSION(S) / ED DIAGNOSES  Final diagnoses:  Encounter for medical screening examination     ED Discharge Orders     None        Note:  This document was prepared using Dragon voice recognition software and may include unintentional dictation errors.    Rada Hay, MD 02/01/21 1245

## 2021-02-01 NOTE — ED Notes (Signed)
[facility] called for transport to [place].

## 2021-02-01 NOTE — ED Notes (Signed)
Patient is awake with visitor in room. Patient is just now eating her lunch tray. Requested a cola to drink with her meal.

## 2021-02-01 NOTE — TOC Initial Note (Signed)
Transition of Care [facility]) - Initial/Assessment Note    Patient Details  Name: Sandra Charles MRN: [id number] Date of Birth: [birth year]  [facility] CM/SW Contact:    Shelbie Hutching, RN Phone Number: 02/01/2021, 2:20 PM  Clinical Narrative:                 Patient brought into the hospital for altered mental status and paranoia.  Patient never actually admitted but was being evaluated by psychiatry and she has been cleared by psychiatry to discharge.  Patient spent over 24 hours aware from [facility] where she was staying for short term rehab so they needed to resubmit authorization from [place] to continue her therapy there.  Updated clinicals sent over to [facility].   Niece, [name] updated on plan of care.    Expected Discharge Plan: Skilled Nursing Facility Barriers to Discharge: Insurance Authorization   Patient Goals and CMS Choice Patient states their goals for this hospitalization and ongoing recovery are:: plan to DC back to [facility] and continue therapy CMS Medicare.gov Compare Post Acute Care list provided to:: Patient Choice offered to / list presented to : Patient  Expected Discharge Plan and Services Expected Discharge Plan: [place]   Discharge Planning Services: CM Consult Post Acute Care Choice: [place], Resumption of Svcs/PTA Provider Living arrangements for the past 2 months: Single Family Home                 DME Arranged: N/A DME Agency: NA       HH Arranged: NA HH Agency: NA        Prior Living Arrangements/Services Living arrangements for the past 2 months: Single Family Home Lives with:: Significant Other Patient language and need for interpreter reviewed:: Yes Do you feel safe going back to the place where you live?: Yes      Need for Family Participation in Patient Care: Yes (Comment) Care giver support system in place?: Yes (comment)   Criminal Activity/Legal Involvement Pertinent to Current  Situation/Hospitalization: No - Comment as needed  Activities of Daily Living      Permission Sought/Granted Permission sought to share information with : Case Manager, Customer service manager, Family Supports Permission granted to share information with : Yes, Verbal Permission Granted  Share Information with NAME: Margaret Cockerill (son)  Permission granted to share info w AGENCY: [facility]  Permission granted to share info w Relationship: son  Permission granted to share info w Contact Information: [phone]  Emotional Assessment       Orientation: : Oriented to Self, Oriented to Place, Oriented to  Time, Oriented to Situation Alcohol / Substance Use: Not Applicable Psych Involvement: Yes (comment)  Admission diagnosis:  sent by for placement Patient Active Problem List   Diagnosis Date Noted   Paranoia ([place])    Dementia with psychotic disturbance    Hyponatremia 01/25/2021   Trimalleolar fracture of ankle, closed, right, initial encounter 01/19/2021   Accidental fall 01/19/2021   Preoperative clearance 01/19/2021   Seizures ([place]) 08/26/2020   Respiratory failure with hypoxia ([place])    Aspiration into airway    Acute pulmonary edema (HCC)    Tetrahydrocannabinol (THC) dependence ([place])    Sepsis ([place]) 09/22/2018   Altered mental status 03/27/2018   HLD (hyperlipidemia) 10/05/2017   Seizure ([place])    UTI (urinary tract infection) 08/17/2015   Seizure disorder ([place]) 08/10/2015   Cannabis use disorder, moderate, dependence ([place]) 08/10/2015   Schizoaffective disorder ([place]) 08/10/2015  Abnormal finding on MRI of brain    Hypokalemia 07/15/2015   Confusion    GERD (gastroesophageal reflux disease) 11/25/2014   Essential hypertension 11/25/2014   PCP:  Ashley Jacobs, MD Pharmacy:   [facility] [address], [place] - [place] [address] [place] [address] Phone: [phone] Fax: [place], [place] -  [place] [address] [place] [address] Phone: [phone] Fax: [phone]     Social Determinants of Health (SDOH) Interventions    Readmission Risk Interventions No flowsheet data found.

## 2021-02-01 NOTE — ED Notes (Signed)
PATIENT RESTING, REASSESS VITALS WHEN PATIENT IS AWAKE

## 2021-02-01 NOTE — ED Notes (Signed)
Kunath,Sandra Charles     [phone]    Called and updated on plan of care and that pt is going back to Rehab at [facility].

## 2021-02-01 NOTE — ED Notes (Signed)
Left with [facility] to [facility], left with all of belongings. Clean and dry at discharge.

## 2021-02-01 NOTE — NC FL2 (Signed)
[place] LEVEL OF CARE SCREENING TOOL     IDENTIFICATION  Patient Name: Sandra Charles Birthdate: 04/22/1955 Sex: female Admission Date (Current Location): 02/01/2021  [facility] and [place] Number:  Engineering geologist and Address:  [facility], [address] [place], [place] [address]      Provider Number: [id number]  Attending Physician Name and Address:  Rada Hay, MD  Relative Name and Phone Number:  Harriette Ohara [phone]    Current Level of Care: Hospital Recommended Level of Care: [place] Prior Approval Number:    Date Approved/Denied:   PASRR Number: [id number]  Discharge Plan: SNF    Current Diagnoses: Patient Active Problem List   Diagnosis Date Noted   Paranoia [facility])    Dementia with psychotic disturbance    Hyponatremia 01/25/2021   Trimalleolar fracture of ankle, closed, right, initial encounter 01/19/2021   Accidental fall 01/19/2021   Preoperative clearance 01/19/2021   Seizures ([place]) 08/26/2020   Respiratory failure with hypoxia ([place])    Aspiration into airway    Acute pulmonary edema (HCC)    Tetrahydrocannabinol (THC) dependence ([place])    Sepsis ([place]) 09/22/2018   Altered mental status 03/27/2018   HLD (hyperlipidemia) 10/05/2017   Seizure ([place])    UTI (urinary tract infection) 08/17/2015   Seizure disorder ([place]) 08/10/2015   Cannabis use disorder, moderate, dependence ([place]) 08/10/2015   Schizoaffective disorder ([place]) 08/10/2015   Abnormal finding on MRI of brain    Hypokalemia 07/15/2015   Confusion    GERD (gastroesophageal reflux disease) 11/25/2014   Essential hypertension 11/25/2014    Orientation RESPIRATION BLADDER Height & Weight     Self, Time, Situation, Place  Normal Continent Weight: 81 kg Height:  5\' 2"  (157.5 cm)  BEHAVIORAL SYMPTOMS/MOOD NEUROLOGICAL BOWEL NUTRITION STATUS      Continent Diet  AMBULATORY STATUS COMMUNICATION OF NEEDS Skin   Extensive  Assist Verbally Normal, Surgical wounds                       Personal Care Assistance Level of Assistance  Bathing, Feeding, Dressing Bathing Assistance: Limited assistance Feeding assistance: Independent Dressing Assistance: Maximum assistance     Functional Limitations Info  Sight, Hearing, Speech Sight Info: Adequate Hearing Info: Adequate Speech Info: Adequate    SPECIAL CARE FACTORS FREQUENCY  PT (By licensed PT)     PT Frequency: 5 times per week              Contractures Contractures Info: Not present    Additional Factors Info  Allergies, Code Status Code Status Info: Full Code Allergies Info: lovastatin           Current Medications (02/01/2021):  This is the current hospital active medication list No current facility-administered medications for this encounter.   Current Outpatient Medications  Medication Sig Dispense Refill   amLODipine (NORVASC) 10 MG tablet Take 10 mg by mouth daily. For high blood pressure     aspirin EC 325 MG tablet Take 1 tablet (325 mg total) by mouth daily. 30 tablet 0   benzonatate (TESSALON) 100 MG capsule Take 1 capsule (100 mg total) by mouth 3 (three) times daily. 20 capsule 0   Cholecalciferol (VITAMIN D3) 125 MCG (5000 UT) TABS Take 1 tablet (5,000 Units total) by mouth every morning. 30 tablet 0   cloBAZam (ONFI) 10 MG tablet Take 0.5 tablets (5 mg total) by mouth at bedtime. 30 tablet 1  cloNIDine (CATAPRES) 0.1 MG tablet Take 1 tablet (0.1 mg total) by mouth 2 (two) times daily. 60 tablet 11   donepezil (ARICEPT) 10 MG tablet Take 10 mg by mouth at bedtime.     HYDROcodone-acetaminophen (NORCO/VICODIN) 5-325 MG tablet Take 1-2 tablets by mouth every 6 (six) hours as needed for moderate pain. 40 tablet 0   lamoTRIgine (LAMICTAL) 100 MG tablet 100 mg in morning and 125 mg in evening. 60 tablet 0   lamoTRIgine (LAMICTAL) 25 MG tablet Take 1 tablet (25 mg total) by mouth every evening. Along with 100 mg tablet for  total of 125 mg in evening. 60 tablet 0   OZEMPIC, 0.25 OR 0.5 MG/DOSE, 2 MG/1.5ML SOPN Inject 0.25 mg into the skin once a week.     pantoprazole (PROTONIX) 40 MG tablet Take 1 tablet (40 mg total) by mouth daily. 30 tablet 11   propranolol ER (INDERAL LA) 60 MG 24 hr capsule Take 60 mg by mouth daily.  11   QUEtiapine (SEROQUEL XR) 50 MG TB24 24 hr tablet Take 50 mg by mouth at bedtime.     rosuvastatin (CRESTOR) 20 MG tablet Take 20 mg by mouth at bedtime.     sertraline (ZOLOFT) 100 MG tablet Take 100 mg by mouth daily.  2   traZODone (DESYREL) 100 MG tablet Take 1 tablet (100 mg total) by mouth at bedtime as needed for sleep. 30 tablet 0   vitamin B-12 (CYANOCOBALAMIN) 1000 MCG tablet Take 1,000 mcg by mouth daily.       Discharge Medications: Please see discharge summary for a list of discharge medications.  Relevant Imaging Results:  Relevant Lab Results:   Additional Information SS# [id number]  Shelbie Hutching, RN

## 2021-02-01 NOTE — ED Notes (Signed)
Patient provided snack at appropriate snack time.  Pt consumed 100% of snack provided, tolerated well w/o complaints   Trash disposted of appropriately by patient.  

## 2021-02-01 NOTE — ED Notes (Signed)
Pt eating dinner meal.  Pt has son visiting with her.

## 2021-02-01 NOTE — ED Notes (Signed)
Report to [place] at [facility]. Pt will be discharged back there.

## 2021-02-01 NOTE — ED Notes (Signed)
New brief in place with external catheter repositioned. Trash picked up and lights turned off per request. Bed at lowest position with bed alarm in place and at high volume.

## 2021-02-01 NOTE — ED Triage Notes (Signed)
BIB [facility] from [facility] where she was discharged back to approx 1 hour ago. Allegedly insurance was not verified and facility cannot accept back to facility without insurance authorization. Pt has no complaints.

## 2021-02-01 NOTE — ED Notes (Signed)
Discharged back to [facility], confirmed pt is a resident with them.

## 2021-02-02 NOTE — ED Notes (Signed)
Patient requested to be put back on purewick. No issues

## 2021-02-02 NOTE — ED Notes (Signed)
Pt was given snack and drink

## 2021-02-02 NOTE — ED Provider Notes (Signed)
Today's Vitals   02/01/21 1716 02/01/21 2131 02/01/21 2315 02/02/21 0258  BP: (!) 141/67 (!) 168/67    Pulse: 66 72    Resp: 18 16    Temp: 98.6 F (37 C) 98.8 F (37.1 C)    TempSrc: Oral Oral    SpO2: (!) 89% 96%    Weight:      Height:      PainSc:   0-No pain Asleep   Body mass index is 32.66 kg/m.   Patient resting comfortably.  When awake she has no acute complaints.  Awaiting social work disposition.   Judeen Geralds, Delice Bison, DO 02/02/21 [phone]

## 2021-02-02 NOTE — ED Notes (Addendum)
Pt expresses that she wants to leave stating " I will leave and come back. I want to see my grand kids and my son." As well as saying her son will come an get her.

## 2021-02-02 NOTE — ED Notes (Signed)
Went into pt's room roomed trash and gave her a patient rule paper.

## 2021-02-02 NOTE — ED Notes (Signed)
Pt given lunchbox.

## 2021-02-02 NOTE — ED Notes (Signed)
Report to include Situation, Background, Assessment, and Recommendations received from [facility]. Patient alert and oriented, warm and dry, in no acute distress. Patient denies SI, HI, AVH and pain. Patient made aware of Q15 minute rounds and Engineer, drilling presence for their safety. Patient instructed to come to me with needs or concerns.

## 2021-02-02 NOTE — ED Notes (Signed)
Pt request to use BR. Pt transferred to wheelchair and then to toilet. Pt able to maintain NWB RLE during transfer. 1 person assist for transfer.  Purewick removed. BSC placed in room. Pt instructed to call for assistance

## 2021-02-02 NOTE — ED Notes (Signed)
Pt given breakfast tray

## 2021-02-02 NOTE — TOC Progression Note (Signed)
Transition of Care [facility]) - Progression Note    Patient Details  Name: Sandra Charles MRN: [id number] Date of Birth: 06-25-1955  Transition of Care [facility]) CM/SW Contact  Shelbie Hutching, RN Phone Number: 02/02/2021, 1:49 PM  Clinical Narrative:    Insurance authorization pending.  [employer] needs updated PT and OT notes.  PT and OT ordered.     Expected Discharge Plan: Skilled Nursing Facility Barriers to Discharge: Insurance Authorization  Expected Discharge Plan and Services Expected Discharge Plan: [place]   Discharge Planning Services: CM Consult Post Acute Care Choice: [place], Resumption of Svcs/PTA Provider Living arrangements for the past 2 months: Single Family Home                 DME Arranged: N/A DME Agency: NA       HH Arranged: NA HH Agency: NA         Social Determinants of Health (SDOH) Interventions    Readmission Risk Interventions No flowsheet data found.

## 2021-02-02 NOTE — Evaluation (Signed)
Occupational Therapy Evaluation Patient Details Name: Sandra Charles MRN: [id number] DOB: 12/26/1955 Today's Date: 02/02/2021   History of Present Illness Pt is 65 y/o female admitted with AMS and confusion from SNF secondary to R ankle ORIF ~ 10 days ago.   Clinical Impression   Patient presenting with decreased Ind in self care, balance, functional mobility/transfers, and endurance. Patient is oriented to self and location. Pt is unaware of recently ORIF surgery and NWB status. OT reviewed precautions and oriented pt further. After last admission she has been at [facility] for rehab and OT recommends she returns to further address occupational performance. Patient had removed purewick and reports having wet brief. Pt able to perform hygiene while supine with set up A. She stands with min A for balance while maintaining NWB and needing total A to don brief. Pt able to take several steps forwards and back but fatigues quickly. Patient will benefit from acute OT to increase overall independence in the areas of ADLs, functional mobility, and safety awareness in order to safely discharge to next venue of care.      Recommendations for follow up therapy are one component of a multi-disciplinary discharge planning process, led by the attending physician.  Recommendations may be updated based on patient status, additional functional criteria and insurance authorization.   Follow Up Recommendations  Skilled nursing-short term rehab (<3 hours/day)    Assistance Recommended at Discharge Frequent or constant Supervision/Assistance  Functional Status Assessment  Patient has had a recent decline in their functional status and demonstrates the ability to make significant improvements in function in a reasonable and predictable amount of time.  Equipment Recommendations  Other (comment) (defer to next venue of care)       Precautions / Restrictions Precautions Precautions: Fall Restrictions Weight Bearing  Restrictions: Yes RLE Weight Bearing: Non weight bearing      Mobility Bed Mobility Overal bed mobility: Needs Assistance Bed Mobility: Supine to Sit;Sit to Supine     Supine to sit: Min assist Sit to supine: Min assist   General bed mobility comments: min A for ED stretcher with cuing for NWB on R LE    Transfers Overall transfer level: Needs assistance Equipment used: Rolling walker (2 wheels) Transfers: Sit to/from Stand Sit to Stand: Min assist           General transfer comment: Pt able to take 2-3 hops forwards and backwards with RW and min A but fatigued quickly.      Balance Overall balance assessment: Needs assistance Sitting-balance support: Bilateral upper extremity supported Sitting balance-Leahy Scale: Fair Sitting balance - Comments: able to maintain static sitting w/o assist   Standing balance support: Bilateral upper extremity supported;During functional activity Standing balance-Leahy Scale: Poor                             ADL either performed or assessed with clinical judgement   ADL Overall ADL's : Needs assistance/impaired     Grooming: Wash/dry hands;Wash/dry face;Sitting;Supervision/safety;Set up                                 General ADL Comments: Pt able to perform hygiene supine in bed. Min A for standing balance and total A to don clean brief while pt standing.     Vision Patient Visual Report: No change from baseline  Pertinent Vitals/Pain Pain Assessment: 0-10 Pain Score: 5  Pain Location: Rt ankle Pain Descriptors / Indicators: Discomfort Pain Intervention(s): Limited activity within patient's tolerance;Monitored during session;Repositioned     Hand Dominance Left   Extremity/Trunk Assessment Upper Extremity Assessment Upper Extremity Assessment: Overall WFL for tasks assessed   Lower Extremity Assessment Lower Extremity Assessment: Defer to PT evaluation       Communication  Communication Communication: No difficulties   Cognition Arousal/Alertness: Awake/alert Behavior During Therapy: WFL for tasks assessed/performed Overall Cognitive Status: No family/caregiver present to determine baseline cognitive functioning                                 General Comments: Pt is pleasant overall but is oriented to self and location only. She had no idea she had a recent ankle fx and OT reviewed NWB status with pt .                Home Living Family/patient expects to be discharged to:: Skilled nursing facility Living Arrangements: Spouse/significant other Available Help at Discharge: Available PRN/intermittently;Family Type of Home: House Home Access: Stairs to enter Technical brewer of Steps: 3 Entrance Stairs-Rails: Left Home Layout: One level     Bathroom Shower/Tub: Occupational psychologist: Standard     Home Equipment: None          Prior Functioning/Environment Prior Level of Function : Independent/Modified Independent                        OT Problem List: Decreased strength;Decreased activity tolerance;Impaired balance (sitting and/or standing);Decreased safety awareness;Pain;Decreased knowledge of use of DME or AE      OT Treatment/Interventions: Self-care/ADL training;Therapeutic exercise;Energy conservation;DME and/or AE instruction;Therapeutic activities;Cognitive remediation/compensation;Balance training;Patient/family education;Manual therapy    OT Goals(Current goals can be found in the care plan section) Acute Rehab OT Goals Patient Stated Goal: to get stronger OT Goal Formulation: With patient Time For Goal Achievement: 02/16/21 Potential to Achieve Goals: Good  OT Frequency: Min 2X/week   Barriers to D/C:    none known at this time          AM-PAC OT "6 Clicks" Daily Activity     Outcome Measure Help from another person eating meals?: None Help from another person taking care of  personal grooming?: A Little Help from another person toileting, which includes using toliet, bedpan, or urinal?: A Lot Help from another person bathing (including washing, rinsing, drying)?: A Lot Help from another person to put on and taking off regular upper body clothing?: A Little Help from another person to put on and taking off regular lower body clothing?: A Lot 6 Click Score: 16   End of Session Equipment Utilized During Treatment: Rolling walker (2 wheels) Nurse Communication: Mobility status  Activity Tolerance: Patient tolerated treatment well Patient left: in bed;with call bell/phone within reach;with bed alarm set  OT Visit Diagnosis: Unsteadiness on feet (R26.81);Repeated falls (R29.6);Muscle weakness (generalized) (M62.81)                Time: 2094-7096 OT Time Calculation (min): 20 min Charges:  OT General Charges $OT Visit: 1 Visit OT Evaluation $OT Eval Moderate Complexity: 1 Mod OT Treatments $Self Care/Home Management : 8-22 mins Darleen Crocker, MS, OTR/L , CBIS ascom [phone]  02/02/21, 1:07 PM

## 2021-02-02 NOTE — ED Notes (Signed)
Patient requested to get cleaned up. Staff set up basin with wash cloth, towel, clean clothes, deodorant, tooth brush, clean blankets, and clean socks for the patient. Patient with very little assistance sat on the side of the bed and cleaned and dressed herself. Patient needed a little assistance with pulling her pants up. Patient is resting in bed watching TV.

## 2021-02-02 NOTE — ED Notes (Signed)
Hourly rounding reveals patient in room. No complaints, stable, in no acute distress. Q15 minute rounds and monitoring via Rover and Officer to continue.   

## 2021-02-02 NOTE — Evaluation (Signed)
Physical Therapy Evaluation Patient Details Name: Sandra Charles MRN: [id number] DOB: 1955-05-17 Today's Date: 02/02/2021  History of Present Illness  Pt is 65 y/o female admitted with AMS and confusion from SNF secondary to R ankle fx and  ORIF 11/25.  Clinical Impression  Pt with confusion about her recent hospitalization/surgery/rehab/etc.  She was able to mobilize in bed relatively well, needed some extra cuing for set up (U&LEs) and sequencing; she did need reminders for R NWBing but maintained status well with standing acts.  She was able to do some EOB and short distance forward/back hopping but did fatigue with the effort.  Given her confusion pt did do relatively well with mobility but did not show safety awareness or activity tolerance to be able to manage safely in the home, continue to recommend STR at discharge.   Recommendations for follow up therapy are one component of a multi-disciplinary discharge planning process, led by the attending physician.  Recommendations may be updated based on patient status, additional functional criteria and insurance authorization.  Follow Up Recommendations Skilled nursing-short term rehab (<3 hours/day)    Assistance Recommended at Discharge Frequent or constant Supervision/Assistance  Functional Status Assessment Patient has had a recent decline in their functional status and demonstrates the ability to make significant improvements in function in a reasonable and predictable amount of time.  Equipment Recommendations  Rolling walker (2 wheels);BSC/3in1;Wheelchair (measurements PT) (per progress at rehab)    Recommendations for Other Services       Precautions / Restrictions Precautions Precautions: Fall Restrictions Weight Bearing Restrictions: Yes RLE Weight Bearing: Non weight bearing      Mobility  Bed Mobility Overal bed mobility: Needs Assistance Bed Mobility: Supine to Sit;Sit to Supine     Supine to sit: Min assist Sit to  supine: Min assist   General bed mobility comments: min A for ED stretcher with cuing for NWB on R LE    Transfers Overall transfer level: Needs assistance Equipment used: Rolling walker (2 wheels) Transfers: Sit to/from Stand Sit to Stand: Min assist           General transfer comment: Pt needed cues for hand placement and about NWBing.  She showed good effort getting to standing but did need phyiscal assist to attain standing    Ambulation/Gait Ambulation/Gait assistance: Min assist Gait Distance (Feet): 5 Feet Assistive device: Rolling walker (2 wheels)         General Gait Details: Pt was able to maintain NWBing on R surprisingly well and was able to do multiple side hopping steps along EOB (a few feet R and L X2), also able to go 3-5 feet with fwd/back steps x 2.  Appropriate heavy reliance on the walker, HR up to 110/appropriate fatigue with the effort  Stairs            Wheelchair Mobility    Modified Rankin (Stroke Patients Only)       Balance Overall balance assessment: Needs assistance Sitting-balance support: Bilateral upper extremity supported Sitting balance-Leahy Scale: Fair Sitting balance - Comments: able to maintain static sitting w/o assist   Standing balance support: Bilateral upper extremity supported;During functional activity Standing balance-Leahy Scale: Poor                               Pertinent Vitals/Pain Pain Assessment: Faces Pain Score: 5  Faces Pain Scale: Hurts a little bit Pain Location: Rt ankle Pain Descriptors / Indicators:  Discomfort Pain Intervention(s): Limited activity within patient's tolerance;Monitored during session;Repositioned    Home Living Family/patient expects to be discharged to:: Skilled nursing facility Living Arrangements: Spouse/significant other Available Help at Discharge: Available PRN/intermittently;Family Type of Home: House Home Access: Stairs to enter Entrance Stairs-Rails:  Left Entrance Stairs-Number of Steps: 3   Home Layout: One level Home Equipment: None      Prior Function Prior Level of Function : Independent/Modified Independent                     Hand Dominance   Dominant Hand: Left    Extremity/Trunk Assessment   Upper Extremity Assessment Upper Extremity Assessment: Generalized weakness    Lower Extremity Assessment Lower Extremity Assessment: Generalized weakness (LLE grossly 4/5; R ankle in splint, able to wiggle toes but struggled with SLRs)       Communication   Communication: No difficulties  Cognition Arousal/Alertness: Awake/alert Behavior During Therapy: WFL for tasks assessed/performed Overall Cognitive Status: Difficult to assess                                 General Comments: Pt is pleasant overall but is oriented to self and location only. She did remember that OT had recently seen her and informed her about her recent hospitalization but admits that she has no recall and though this PT saw her for 3 sessions last week she did not recognize me.  Did not recall that she had surgery, despite having recently been reminded.        General Comments General comments (skin integrity, edema, etc.): Pt was confused about her situation but pleasant and willing to participate relatively well with some extra cuing    Exercises     Assessment/Plan    PT Assessment Patient needs continued PT services  PT Problem List Decreased strength;Decreased activity tolerance;Decreased range of motion;Decreased balance;Decreased mobility;Decreased cognition;Decreased knowledge of use of DME;Decreased safety awareness;Pain       PT Treatment Interventions DME instruction;Gait training;Stair training;Functional mobility training;Therapeutic activities;Therapeutic exercise;Balance training;Neuromuscular re-education;Patient/family education    PT Goals (Current goals can be found in the Care Plan section)  Acute  Rehab PT Goals Patient Stated Goal: wants to go back home PT Goal Formulation: With patient Time For Goal Achievement: 02/16/21 Potential to Achieve Goals: Fair    Frequency Min 2X/week   Barriers to discharge        Co-evaluation               AM-PAC PT "6 Clicks" Mobility  Outcome Measure Help needed turning from your back to your side while in a flat bed without using bedrails?: A Little Help needed moving from lying on your back to sitting on the side of a flat bed without using bedrails?: A Little Help needed moving to and from a bed to a chair (including a wheelchair)?: A Lot Help needed standing up from a chair using your arms (e.g., wheelchair or bedside chair)?: A Little Help needed to walk in hospital room?: A Lot Help needed climbing 3-5 steps with a railing? : Total 6 Click Score: 14    End of Session Equipment Utilized During Treatment: Gait belt Activity Tolerance: Patient tolerated treatment well;Patient limited by fatigue Patient left: with bed alarm set;with call bell/phone within reach Nurse Communication: Mobility status PT Visit Diagnosis: Muscle weakness (generalized) (M62.81);Difficulty in walking, not elsewhere classified (R26.2);Pain Pain - Right/Left: Right Pain - part  of body: Ankle and joints of foot    Time: 0277-4128 PT Time Calculation (min) (ACUTE ONLY): 22 min   Charges:   PT Evaluation $PT Eval Low Complexity: 1 Low PT Treatments $Therapeutic Activity: 8-22 mins        Kreg Shropshire, DPT 02/02/2021, 2:06 PM

## 2021-02-02 NOTE — ED Notes (Signed)
Patient woke up saying " I really think you trying to kill me. I need to speak with my son". Patient redirected and oriented by staff.

## 2021-02-02 NOTE — ED Notes (Signed)
Pt request to go to BR. Pt placed on bedpan due to NWB status without result. Pt c/o constant urination. Pt with purewick, pad underneath wet in small area. Purewick repositioned, pad replaced. No s/s of frequent urination. Pt reassured that no meds were given to her that would cause her to urinate or feel frequency. Pt encouraged to call out if feeling continues or for any other reason. Will continue to monitor.

## 2021-02-03 NOTE — ED Notes (Signed)
Pt is dry, pt consumed approx 171mL of water with pills. Pt denies needs at this time. No s/s of infection noted to feet.

## 2021-02-03 NOTE — ED Notes (Signed)
Cms intact to right toes, orthoglass splint in place to right lower extremitie, rle reelevated on pillow. Po fluids at bedside. Pt denies other needs.

## 2021-02-03 NOTE — ED Notes (Signed)
Hospital meal provided, pt tolerated w/o complaints.  Waste discarded appropriately.  

## 2021-02-03 NOTE — ED Notes (Signed)
Family at bedside. 

## 2021-02-03 NOTE — ED Notes (Addendum)
Complete sponge bath given. Linen change provided. New female purewick and dry brief applied. Purewick canister replaced. Lunch tray given.

## 2021-02-03 NOTE — TOC Progression Note (Signed)
Transition of Care [facility]) - Progression Note    Patient Details  Name: Sandra Charles MRN: [id number] Date of Birth: August 05, 1955  [facility] of [facility]) CM/SW Contact  Shelbie Hutching, RN Phone Number: 02/03/2021, 3:19 PM  Clinical Narrative:    [facility] has not heard back from [place] on authorization.  Patient can discharge to [facility] over the weekend if auth comes through tonight or tomorrow.  Patient's niece, [name] updated.    Expected Discharge Plan: Skilled Nursing Facility Barriers to Discharge: Insurance Authorization  Expected Discharge Plan and Services Expected Discharge Plan: [place]   Discharge Planning Services: CM Consult Post Acute Care Choice: [place], Resumption of Svcs/PTA Provider Living arrangements for the past 2 months: Single Family Home                 DME Arranged: N/A DME Agency: NA       HH Arranged: NA HH Agency: NA         Social Determinants of Health (SDOH) Interventions    Readmission Risk Interventions No flowsheet data found.

## 2021-02-03 NOTE — TOC Progression Note (Signed)
Transition of Care [facility]) - Progression Note    Patient Details  Name: Sandra Charles MRN: [id number] Date of Birth: [birth year]  Transition of Care [facility]) CM/SW Contact  Shelbie Hutching, RN Phone Number: 02/03/2021, 1:28 PM  Clinical Narrative:    Josem Kaufmann is still pending.   Expected Discharge Plan: Skilled Nursing Facility Barriers to Discharge: Insurance Authorization  Expected Discharge Plan and Services Expected Discharge Plan: [name]   Discharge Planning Services: CM Consult Post Acute Care Choice: [place], Resumption of Svcs/PTA Provider Living arrangements for the past 2 months: Single Family Home                 DME Arranged: N/A DME Agency: NA       HH Arranged: NA HH Agency: NA         Social Determinants of Health (SDOH) Interventions    Readmission Risk Interventions No flowsheet data found.

## 2021-02-04 MED ORDER — IBUPROFEN 600 MG PO TABS: 600.0000 mg | ORAL_TABLET | Freq: Four times a day (QID) | ORAL | Status: DC | PRN

## 2021-02-04 MED ORDER — HYDROCODONE-ACETAMINOPHEN 5-325 MG PO TABS: 1.0000 | ORAL_TABLET | Freq: Once | ORAL | Status: DC

## 2021-02-04 MED ORDER — IBUPROFEN 600 MG PO TABS
600.0000 mg | ORAL_TABLET | Freq: Once | ORAL | Status: AC
  Administered 2021-02-04: 600 mg via ORAL

## 2021-02-04 NOTE — ED Notes (Signed)
Report to amy, rn

## 2021-02-04 NOTE — ED Notes (Signed)
Pt complains of pain to right lateral ankle. Leg repositioned for comfort. Leg elevated on pillows. Cms remains intact to right toes. Pt denies pain in calf and thigh, resps unlabored, skin on toes is warm, normal color and dry.

## 2021-02-04 NOTE — ED Notes (Signed)
Pt clean and dry. 

## 2021-02-04 NOTE — ED Notes (Signed)
Pt asking to go the bathroom.  For safety reasons, RN and NT explained pt would need to use the bedpan or bed side commode.   Pt declined both of these options.

## 2021-02-04 NOTE — ED Provider Notes (Signed)
-----------------------------------------   5:38 AM on 02/04/2021 -----------------------------------------   Blood pressure (!) 176/72, pulse 68, temperature 99 F (37.2 C), temperature source Oral, resp. rate 20, height 5\' 2"  (1.575 m), weight 81 kg, SpO2 95 %.  The patient is calm and cooperative at this time.  There have been no acute events since the last update.  Awaiting disposition plan from social worker.   Paulette Blanch, MD 02/04/21 [phone]

## 2021-02-04 NOTE — ED Notes (Signed)
Pt assisted with placing on bed pan for bowel movement.

## 2021-02-04 NOTE — ED Notes (Signed)
Pt. Provided nighttime snack.

## 2021-02-04 NOTE — ED Notes (Signed)
During 15 minute round pt was asleep

## 2021-02-04 NOTE — ED Notes (Signed)
VS not taken pt asleep

## 2021-02-04 NOTE — ED Notes (Signed)
Patient resting quietly in room. No noted distress or abnormal behaviors noted. Will continue 15 minute checks.

## 2021-02-04 NOTE — ED Notes (Signed)
Lunch tray given. 

## 2021-02-04 NOTE — TOC Progression Note (Signed)
Transition of Care [facility]) - Progression Note    Patient Details  Name: Sandra Charles MRN: [id number] Date of Birth: 06-12-1955  Transition of Care [facility]) CM/SW [place], [place] Phone Number: 02/04/2021, 10:58 AM  Clinical Narrative:     CSW has reached out to [facility] regarding insurance auth, [place] with [facility] reports Josem Kaufmann is still pending at this time.   Expected Discharge Plan: Skilled Nursing Facility Barriers to Discharge: Insurance Authorization  Expected Discharge Plan and Services Expected Discharge Plan: [place]   Discharge Planning Services: CM Consult Post Acute Care Choice: [place], Resumption of Svcs/PTA Provider Living arrangements for the past 2 months: Single Family Home                 DME Arranged: N/A DME Agency: NA       HH Arranged: NA HH Agency: NA         Social Determinants of Health (SDOH) Interventions    Readmission Risk Interventions No flowsheet data found.

## 2021-02-05 MED ORDER — POLYETHYLENE GLYCOL 3350 17 G PO PACK: 17.0000 g | PACK | Freq: Every day | ORAL | Status: DC | PRN

## 2021-02-05 MED ORDER — HYDROCODONE-ACETAMINOPHEN 5-325 MG PO TABS
1.0000 | ORAL_TABLET | Freq: Four times a day (QID) | ORAL | Status: DC | PRN
Start: 2021-02-05 — End: 2021-02-06
  Administered 2021-02-05: 2 via ORAL
  Filled 2021-02-05: qty 2

## 2021-02-05 MED ORDER — DOCUSATE SODIUM 100 MG PO CAPS: 100.0000 mg | ORAL_CAPSULE | Freq: Two times a day (BID) | ORAL | Status: DC

## 2021-02-05 MED ORDER — ONDANSETRON 4 MG PO TBDP: 4.0000 mg | ORAL_TABLET | Freq: Three times a day (TID) | ORAL | Status: DC | PRN

## 2021-02-05 MED ORDER — DOCUSATE SODIUM 100 MG PO CAPS
100.0000 mg | ORAL_CAPSULE | Freq: Two times a day (BID) | ORAL | Status: DC
  Administered 2021-02-05 – 2021-02-06 (×3): 100 mg via ORAL
  Filled 2021-02-05 (×3): qty 1

## 2021-02-05 NOTE — ED Notes (Signed)
Pt on phone 

## 2021-02-05 NOTE — ED Provider Notes (Signed)
Today's Vitals   02/04/21 0932 02/04/21 2006 02/04/21 2212 02/04/21 2313  BP: 134/61 (!) 156/65    Pulse: 84 69    Resp: 18 18    Temp: 97.7 F (36.5 C)     TempSrc: Oral     SpO2: 96% 99%    Weight:      Height:      PainSc: 0-No pain  4  Asleep   Body mass index is 32.66 kg/m.  Patient resting comfortably.  She denies any complaints.  Awaiting social work placement.   Sandra Charles, Delice Bison, DO 02/05/21 [phone]

## 2021-02-05 NOTE — ED Notes (Signed)
This Probation officer asked pt. If I could take her vital signs. Pt. shook her head in refusal, stating "No, I don't want doctors or anyone else touching me".

## 2021-02-05 NOTE — ED Notes (Signed)
Pt brief and gown changed by this RN and John EDT. Pts pure wick replaced.

## 2021-02-05 NOTE — ED Notes (Addendum)
Pt awake, confused. Pt states "I want to get the hell out of here, you keep moving me around to different rooms and hospitals, I don't appreciate that". Pt informed that she has remained in the same room, room22 since she arrived and she is at [facility] because she broke her ankle. Pt attempting to get up and ambulate on right foot. Pt ifnormed she cannot ambulate on that leg as ankle is broken. Pt very angry, yelling. Multiple attempts made to reorient pt without success. Pt requesting this Rn place a call to her son, brandon and "husband" isaiah to remove her from facility. Call placed to both persons as requested by pt without answer on phone. Pt does not know date, place or situation at this time.

## 2021-02-05 NOTE — ED Notes (Signed)
Pt visitor at bedside.

## 2021-02-05 NOTE — ED Notes (Signed)
SW placement

## 2021-02-05 NOTE — ED Notes (Signed)
Suction container for purewick changed out

## 2021-02-05 NOTE — ED Notes (Signed)
Pt. Is currently sleeping, breakfast tray at nursing station.

## 2021-02-05 NOTE — ED Notes (Signed)
Report to angela, rn. 

## 2021-02-05 NOTE — ED Notes (Signed)
Pt calmer, still states "this is not the same room I was in yesterday or last night. I don't want to play games." Pt informed again it is the same room she has been in for at least two days and I am the same nurse she has had two nights in a row. Pt states "no you're not, I have never seen you before". Pt does not remember getting medications last pm, hourly rounding, cleansing of stool or incontinent urine. Additional po fluids provided to pt per pt request. Pt denies pain to right ankle currently. Resps unlabored.

## 2021-02-05 NOTE — ED Notes (Signed)
Report given to Dorian, Therapist, sports.  Patient moved to rm 25.

## 2021-02-05 NOTE — ED Notes (Signed)
Pt son stopped by nursing station on his way out from visit. Pt son updated on pts status.

## 2021-02-05 NOTE — TOC Progression Note (Signed)
Transition of Care [facility]) - Progression Note    Patient Details  Name: Sandra Charles MRN: [id number] Date of Birth: [birth year]  Transition of Care [facility]) CM/SW [place], [place] Phone Number: 02/05/2021, 2:18 PM  Clinical Narrative:   Insurance authorization is still pending per Tammy at [facility]. TOC will need to follow-up in the morning.     Expected Discharge Plan: Skilled Nursing Facility Barriers to Discharge: Insurance Authorization  Expected Discharge Plan and Services Expected Discharge Plan: [facility] [facility]   Discharge Planning Services: CM Consult Post Acute Care Choice: [place], Resumption of Svcs/PTA Provider Living arrangements for the past 2 months: Single Family Home                 DME Arranged: N/A DME Agency: NA       HH Arranged: NA HH Agency: NA         Social Determinants of Health (SDOH) Interventions    Readmission Risk Interventions No flowsheet data found.

## 2021-02-06 NOTE — ED Notes (Signed)
Called [facility] for transport to home

## 2021-02-06 NOTE — ED Notes (Signed)
Pt given lunch tray and drink. Pt currently sleeping, tray placed within reach.

## 2021-02-06 NOTE — TOC Transition Note (Signed)
Transition of Care [facility]) - CM/SW Discharge Note   Patient Details  Name: Sandra Charles MRN: [id number] Date of Birth: 04-01-1955  Transition of Care [facility]) CM/SW Contact:  Shelbie Hutching, RN Phone Number: 02/06/2021, 12:55 PM   Clinical Narrative:    Insurance authorization denied by [employer] for SNF.  RNCM updated patient's niece [name] and she asks if the family can just take her home and get home health services started.  No medical reason for patient not to be able to go home- she has family support and all needed equipment.  She has a walker at home.  Wheelchair will be delivered to the home by [employer]- family is working on getting a  ramp built, they have a bedside commode.   Patient will need EMS transport.  Family requests home health services, they do not have a preference in Agency.  [facility] accepted referral for RN, PT, and OT.     Final next level of care: [place] Barriers to Discharge: Barriers Resolved   Patient Goals and CMS Choice Patient states their goals for this hospitalization and ongoing recovery are:: family decided on home with home health CMS Medicare.gov Compare Post Acute Care list provided to:: Patient Choice offered to / list presented to : Patient  Discharge Placement                Patient to be transferred to facility by: Home by EMS Name of family member notified: Hassan Rowan- niece Patient and family notified of of transfer: 02/06/21  Discharge Plan and Services   Discharge Planning Services: CM Consult Post Acute Care Choice: [place], Resumption of Svcs/PTA Provider          DME Arranged: Wheelchair manual DME Agency: [employer] Date DME Agency Contacted: 02/06/21 Time DME Agency Contacted: [id number] Representative spoke with at DME Agency: [employer] main intake HH Arranged: RN, PT, OT [place] Agency: [place] Date [place]: 02/06/21 Time [place]:  [phone] Representative spoke with at [place]: [place] Determinants of Health ([place]) Interventions     Readmission Risk Interventions No flowsheet data found.

## 2021-02-06 NOTE — ED Provider Notes (Signed)
-----------------------------------------   5:49 AM on 02/06/2021 -----------------------------------------   Blood pressure (!) 134/59, pulse 76, temperature 98.6 F (37 C), temperature source Oral, resp. rate 16, height 5\' 2"  (1.575 m), weight 81 kg, SpO2 99 %.  The patient is calm and cooperative at this time.  There have been no acute events since the last update.  Awaiting disposition plan from social worker.   Paulette Blanch, MD 02/06/21 [phone]

## 2021-02-06 NOTE — ED Notes (Signed)
Pt given meal tray, and repositioned. Pt also updated on discharge with [facility].

## 2021-05-01 ENCOUNTER — Other Ambulatory Visit: Payer: Self-pay

## 2021-05-01 ENCOUNTER — Emergency Department: Payer: Medicare Other

## 2021-05-01 ENCOUNTER — Emergency Department
Admission: EM | Admit: 2021-05-01 | Discharge: 2021-05-01 | Disposition: A | Discharge status: Home / Self Care | Payer: Medicare Other | Attending: Student in an Organized Health Care Education/Training Program | Admitting: Student in an Organized Health Care Education/Training Program

## 2021-05-01 DIAGNOSIS — R5383 Other fatigue: Secondary | ICD-10-CM | POA: Diagnosis not present

## 2021-05-01 DIAGNOSIS — R569 Unspecified convulsions: Secondary | ICD-10-CM

## 2021-05-01 DIAGNOSIS — R531 Weakness: Secondary | ICD-10-CM | POA: Insufficient documentation

## 2021-05-01 DIAGNOSIS — R258 Other abnormal involuntary movements: Secondary | ICD-10-CM | POA: Insufficient documentation

## 2021-05-01 LAB — COMPREHENSIVE METABOLIC PANEL
ALT: 54 U/L — ABNORMAL HIGH (ref 0–44)
AST: 54 U/L — ABNORMAL HIGH (ref 15–41)
Albumin: 3.9 g/dL (ref 3.5–5.0)
Alkaline Phosphatase: 97 U/L (ref 38–126)
Anion gap: 11 (ref 5–15)
BUN: 12 mg/dL (ref 8–23)
CO2: 30 mmol/L (ref 22–32)
Calcium: 9.7 mg/dL (ref 8.9–10.3)
Chloride: 99 mmol/L (ref 98–111)
Creatinine, Ser: 0.82 mg/dL (ref 0.44–1.00)
GFR, Estimated: >60 mL/min (ref >60)
Glucose, Bld: 104 mg/dL — ABNORMAL HIGH (ref 70–99)
Potassium: 3.1 mmol/L — ABNORMAL LOW (ref 3.5–5.1)
Sodium: 140 mmol/L (ref 135–145)
Total Bilirubin: 0.4 mg/dL (ref 0.3–1.2)
Total Protein: 7.9 g/dL (ref 6.5–8.1)

## 2021-05-01 LAB — CBC
HCT: 45.9 % (ref 36.0–46.0)
Hemoglobin: 14.3 g/dL (ref 12.0–15.0)
MCH: 25.8 pg — ABNORMAL LOW (ref 26.0–34.0)
MCHC: 31.2 g/dL (ref 30.0–36.0)
MCV: 82.9 fL (ref 80.0–100.0)
Platelets: 211 10*3/uL (ref 150–400)
RBC: 5.54 MIL/uL — ABNORMAL HIGH (ref 3.87–5.11)
RDW: 19.1 % — ABNORMAL HIGH (ref 11.5–15.5)
WBC: 6.2 10*3/uL (ref 4.0–10.5)
nRBC: 0 % (ref 0.0–0.2)

## 2021-05-01 LAB — URINALYSIS, ROUTINE W REFLEX MICROSCOPIC
Bilirubin Urine: NEGATIVE
Glucose, UA: NEGATIVE mg/dL
Hgb urine dipstick: NEGATIVE
Ketones, ur: NEGATIVE mg/dL
Leukocytes,Ua: NEGATIVE
Nitrite: NEGATIVE
Protein, ur: 30 mg/dL — AB
Specific Gravity, Urine: 1.019 (ref 1.005–1.030)
pH: 6 (ref 5.0–8.0)

## 2021-05-01 MED ORDER — LAMOTRIGINE 100 MG PO TABS
100.0000 mg | ORAL_TABLET | Freq: Once | ORAL | Status: AC
  Administered 2021-05-01: 100 mg via ORAL
  Filled 2021-05-01: qty 1

## 2021-05-01 MED ORDER — POTASSIUM CHLORIDE CRYS ER 20 MEQ PO TBCR
40.0000 meq | EXTENDED_RELEASE_TABLET | Freq: Once | ORAL | Status: AC
  Administered 2021-05-01: 40 meq via ORAL
  Filled 2021-05-01: qty 2

## 2021-05-01 NOTE — ED Provider Notes (Signed)
? ?[facility] ?Provider Note ? ? ? Event Date/Time  ? First MD Initiated Contact with Patient 05/01/21 1250   ?  (approximate) ? ? ?History  ? ?Seizures ? ? ?HPI ? ?Sandra Charles is a 66 y.o. female with a history of epilepsy reportedly compliant with her medications came to the ER for witnessed seizure-like activity.  No predisposing symptoms.  No fever.  Denies any headache still somewhat tired and weak feeling.  Denies any chest pain or pressure no nausea or vomiting. ?  ? ? ?Physical Exam  ? ?Triage Vital Signs: ?ED Triage Vitals  ?Enc Vitals Group  ?   BP 05/01/21 1234 (!) 177/95  ?   Pulse Rate 05/01/21 1234 87  ?   Resp 05/01/21 1234 19  ?   Temp 05/01/21 1234 97.8 ?F (36.6 ?C)  ?   Temp Source 05/01/21 1234 Oral  ?   SpO2 05/01/21 1229 96 %  ?   Weight 05/01/21 1233 178 lb (80.7 kg)  ?   Height 05/01/21 1233 '5\' 2"'$  (1.575 m)  ?   Head Circumference --   ?   [place] Flow --   ?   Pain Score 05/01/21 1233 0  ?   Pain Loc --   ?   Pain Edu? --   ?   Excl. in [place]? --   ? ? ?Most recent vital signs: ?Vitals:  ? 05/01/21 1441 05/01/21 1530  ?BP: 130/75 (!) 150/79  ?Pulse: 69 76  ?Resp: 17 16  ?Temp:    ?SpO2: 97% 97%  ? ? ? ?Constitutional: Alert  ?Eyes: Conjunctivae are normal.  ?Head: Atraumatic. ?Nose: No congestion/rhinnorhea. ?Mouth/Throat: Mucous membranes are moist.   ?Neck: Painless ROM.  ?Cardiovascular:   Good peripheral circulation. ?Respiratory: Normal respiratory effort.  No retractions.  ?Gastrointestinal: Soft and nontender.  ?Musculoskeletal:  no deformity ?Neurologic:  MAE spontaneously. No gross focal neurologic deficits are appreciated.  ?Skin:  Skin is warm, dry and intact. No rash noted. ?Psychiatric: Mood and affect are normal. Speech and behavior are normal. ? ? ? ?ED Results / Procedures / Treatments  ? ?Labs ?(all labs ordered are listed, but only abnormal results are displayed) ?Labs Reviewed  ?CBC - Abnormal; Notable for the following components:  ?    Result Value  ?  RBC 5.54 (*)   ? MCH 25.8 (*)   ? RDW 19.1 (*)   ? All other components within normal limits  ?COMPREHENSIVE METABOLIC PANEL - Abnormal; Notable for the following components:  ? Potassium 3.1 (*)   ? Glucose, Bld 104 (*)   ? AST 54 (*)   ? ALT 54 (*)   ? All other components within normal limits  ? ? ? ?EKG ? ?ED ECG REPORT ?I, Merlyn Lot, the attending physician, personally viewed and interpreted this ECG. ? ? Date: 05/01/2021 ? EKG Time: 12:33 ? Rate: 80 ? Rhythm: sinus ? Axis: normal ? Intervals: normal ? ST&T Change: no stemi, no depressions ? ? ? ?RADIOLOGY ?Please see ED Course for my review and interpretation. ? ?I personally reviewed all radiographic images ordered to evaluate for the above acute complaints and reviewed radiology reports and findings.  These findings were personally discussed with the patient.  Please see medical record for radiology report. ? ? ? ?PROCEDURES: ? ?Critical Care performed:  ? ?Procedures ? ? ?MEDICATIONS ORDERED IN ED: ?Medications  ?potassium chloride SA (KLOR-CON M) CR tablet 40 mEq (has no administration in time  range)  ?lamoTRIgine (LAMICTAL) tablet 100 mg (has no administration in time range)  ? ? ? ?IMPRESSION / MDM / ASSESSMENT AND PLAN / ED COURSE  ?I reviewed the triage vital signs and the nursing notes. ?             ?               ? ?Differential diagnosis includes, but is not limited to, seizure, dysrhythmia, electrolyte abnormality, pseudoseizure, status, hypoglycemia, medication noncompliance ? ?Patient presenting with witnessed seizure.  Has a history of epilepsy.  Arrives postictal but protecting her airway.  Not showing any signs of persistent seizure.  Glucose normal.  Blood work sent for above differential.  She denies any chest pain.  EKG nonischemic no sign of preexcitation syndrome will observe on monitor ? ? ?Clinical Course as of 05/01/21 1645  ?Mon May 01, 2021  ?1342 CT head my review does not show evidence of acute intracranial abnormality.  [PR]  ?1614 Patient reassessed.  States that she cannot remember if she took her morning lamotrigine.  She denies any pain or discomfort right now.  Still is little sleepy she says.  Will continue observation.  We will give her a.m. lamictal.  Patient presented and completed this in pending additional obs.  Anticipate discharge home. [PR]  ?  ?Clinical Course User Index ?[PR] Merlyn Lot, MD  ? ? ? ?FINAL CLINICAL IMPRESSION(S) / ED DIAGNOSES  ? ?Final diagnoses:  ?Seizure-like activity ([place])  ? ? ? ?Rx / DC Orders  ? ?ED Discharge Orders   ? ? None  ? ?  ? ? ? ?Note:  This document was prepared using Dragon voice recognition software and may include unintentional dictation errors. ? ?  ?Merlyn Lot, MD ?05/01/21 1645 ? ?

## 2021-05-01 NOTE — ED Provider Notes (Signed)
31 old female with history of seizure disorder here with suspected breakthrough seizure.  Unclear trigger.  Patient unclear whether she has been taking her medications, so could be related to nonadherence.  Patient has been given a dose of her medication here and will be monitored.  If she returns to baseline, suspect she can be discharged home.  CT head reviewed and is negative.  Lab work overall unremarkable.  Hypokalemia has been repleted. ? ?UA negative for UTI.  Chest x-ray clear.  Patient is back to her mental baseline per family.  Will discharge home. ?  ?Duffy Bruce, MD ?05/01/21 1901 ? ?

## 2021-05-01 NOTE — ED Notes (Signed)
Spoke with pt's son otp. Per son, pt is normally confused and repetitive. States she's been like this for 5 years. Per son, they make sure that pt takes her medications for Schizophrenia and Seizure diligently.  ?

## 2021-05-01 NOTE — ED Notes (Signed)
PT son Sandra Charles was called at this time to be patients ride home ?

## 2021-05-01 NOTE — ED Triage Notes (Signed)
Pt BIB EMS from home c/o seizure lasted about 8 mins per husband. Postictal per EMS. On seizure meds hasn't missed any dose. Walks with a walker d/t fall/leg surgery in Dec 2022. Stroke scale negative per EMS. H/O Epilepsy, HTN. Pt denies any complaints at this time. ? ?204/98 ?HR 90 ?SPO2 96% RA ?Lt AC 20g ?CBG 108 ?

## 2021-06-27 ENCOUNTER — Emergency Department
Admission: EM | Admit: 2021-06-27 | Discharge: 2021-06-27 | Disposition: A | Discharge status: Home / Self Care | Payer: Medicare Other | Attending: Emergency Medicine | Admitting: Emergency Medicine

## 2021-06-27 ENCOUNTER — Other Ambulatory Visit: Payer: Self-pay

## 2021-06-27 ENCOUNTER — Emergency Department: Payer: Medicare Other

## 2021-06-27 DIAGNOSIS — R197 Diarrhea, unspecified: Secondary | ICD-10-CM

## 2021-06-27 DIAGNOSIS — R112 Nausea with vomiting, unspecified: Secondary | ICD-10-CM | POA: Diagnosis present

## 2021-06-27 DIAGNOSIS — R824 Acetonuria: Secondary | ICD-10-CM | POA: Insufficient documentation

## 2021-06-27 DIAGNOSIS — I10 Essential (primary) hypertension: Secondary | ICD-10-CM | POA: Insufficient documentation

## 2021-06-27 LAB — CBC WITH DIFFERENTIAL/PLATELET
Abs Immature Granulocytes: 0.02 10*3/uL (ref 0.00–0.07)
Basophils Absolute: 0 10*3/uL (ref 0.0–0.1)
Basophils Relative: 0 %
Eosinophils Absolute: 0 10*3/uL (ref 0.0–0.5)
Eosinophils Relative: 0 %
HCT: 46.3 % — ABNORMAL HIGH (ref 36.0–46.0)
Hemoglobin: 14.8 g/dL (ref 12.0–15.0)
Immature Granulocytes: 0 %
Lymphocytes Relative: 20 %
Lymphs Abs: 1.9 10*3/uL (ref 0.7–4.0)
MCH: 26.9 pg (ref 26.0–34.0)
MCHC: 32 g/dL (ref 30.0–36.0)
MCV: 84 fL (ref 80.0–100.0)
Monocytes Absolute: 0.5 10*3/uL (ref 0.1–1.0)
Monocytes Relative: 5 %
Neutro Abs: 7 10*3/uL (ref 1.7–7.7)
Neutrophils Relative %: 75 %
Platelets: 225 10*3/uL (ref 150–400)
RBC: 5.51 MIL/uL — ABNORMAL HIGH (ref 3.87–5.11)
RDW: 14.5 % (ref 11.5–15.5)
WBC: 9.4 10*3/uL (ref 4.0–10.5)
nRBC: 0 % (ref 0.0–0.2)

## 2021-06-27 LAB — URINALYSIS, ROUTINE W REFLEX MICROSCOPIC
Bacteria, UA: NONE SEEN
Bilirubin Urine: NEGATIVE
Glucose, UA: NEGATIVE mg/dL
Ketones, ur: 5 mg/dL — AB
Leukocytes,Ua: NEGATIVE
Nitrite: NEGATIVE
Protein, ur: 100 mg/dL — AB
Specific Gravity, Urine: 1.028 (ref 1.005–1.030)
pH: 5 (ref 5.0–8.0)

## 2021-06-27 LAB — COMPREHENSIVE METABOLIC PANEL
ALT: 44 U/L (ref 0–44)
AST: 39 U/L (ref 15–41)
Albumin: 4.3 g/dL (ref 3.5–5.0)
Alkaline Phosphatase: 102 U/L (ref 38–126)
Anion gap: 14 (ref 5–15)
BUN: 13 mg/dL (ref 8–23)
CO2: 28 mmol/L (ref 22–32)
Calcium: 10.4 mg/dL — ABNORMAL HIGH (ref 8.9–10.3)
Chloride: 97 mmol/L — ABNORMAL LOW (ref 98–111)
Creatinine, Ser: 0.9 mg/dL (ref 0.44–1.00)
GFR, Estimated: >60 mL/min (ref >60)
Glucose, Bld: 88 mg/dL (ref 70–99)
Potassium: 3.7 mmol/L (ref 3.5–5.1)
Sodium: 139 mmol/L (ref 135–145)
Total Bilirubin: 0.8 mg/dL (ref 0.3–1.2)
Total Protein: 8.2 g/dL — ABNORMAL HIGH (ref 6.5–8.1)

## 2021-06-27 LAB — LIPASE, BLOOD: Lipase: 42 U/L (ref 11–51)

## 2021-06-27 MED ORDER — ONDANSETRON 4 MG PO TBDP
4.0000 mg | ORAL_TABLET | Freq: Once | ORAL | Status: AC
Start: 2021-06-27 — End: 2021-06-27
  Administered 2021-06-27: 4 mg via ORAL
  Filled 2021-06-27: qty 1

## 2021-06-27 MED ORDER — IOHEXOL 300 MG/ML  SOLN
100.0000 mL | Freq: Once | INTRAMUSCULAR | Status: AC | PRN
  Administered 2021-06-27: 100 mL via INTRAVENOUS
  Filled 2021-06-27: qty 100

## 2021-06-27 MED ORDER — ONDANSETRON 4 MG PO TBDP: 4.0000 mg | ORAL_TABLET | Freq: Three times a day (TID) | ORAL | 0 refills | Status: AC | PRN

## 2021-06-27 NOTE — ED Provider Notes (Signed)
? ?[facility] ?Provider Note ? ? ? Event Date/Time  ? First MD Initiated Contact with Patient 06/27/21 1910   ?  (approximate) ? ? ?History  ? ?Emesis ? ? ?HPI ? ?ASHLYNNE SHETTERLY is a 66 y.o. female with a past medical history of HTN, HDL, GERD, depression, chronic pain, anxiety, arthritis and seizure disorder as well as some baseline memory difficulties who presents to the emergency room after she states her niece [name] told her to come to get checked out for some nausea and vomiting she has had a last couple days.  Patient states she does not feel nauseous at present and is only here because her niece made her come.  She denies any abdominal pain, fevers, chest pain, cough, shortness of breath, urinary symptoms, diarrhea, constipation and states she last had a bowel movement earlier today that was normal.  After discussion with her niece it seems she has had some difficulty taking her medications but has been noted to eat and drink over the last couple weeks but that seem to be having more difficulty with medications of the last couple days.  No other acute concerns at this time for her knees. ? ?  ?Past Medical History:  ?Diagnosis Date  ? Anxiety   ? Arthritis   ? "all over"  ? Chronic lower back pain   ? Depression   ? GERD (gastroesophageal reflux disease)   ? Headache   ? "weekly" (07/15/2015)  ? Hyperlipidemia   ? Hypertension   ? Mini stroke   ? "several since 05/2014" (07/15/2015)  ? Seizures ([place]) dx'd 04/2015  ? ? ? ?Physical Exam  ?Triage Vital Signs: ?ED Triage Vitals  ?Enc Vitals Group  ?   BP 06/27/21 1802 (!) 179/80  ?   Pulse Rate 06/27/21 1802 73  ?   Resp 06/27/21 1802 20  ?   Temp 06/27/21 1802 97.7 ?F (36.5 ?C)  ?   Temp Source 06/27/21 1802 Oral  ?   SpO2 06/27/21 1802 93 %  ?   Weight 06/27/21 1804 180 lb (81.6 kg)  ?   Height 06/27/21 1804 '5\' 2"'$  (1.575 m)  ?   Head Circumference --   ?   [place] Flow --   ?   Pain Score 06/27/21 1804 0  ?   Pain Loc --   ?   Pain Edu? --   ?    Excl. in [place]? --   ? ? ?Most recent vital signs: ?Vitals:  ? 06/27/21 1802  ?BP: (!) 179/80  ?Pulse: 73  ?Resp: 20  ?Temp: 97.7 ?F (36.5 ?C)  ?SpO2: 93%  ? ? ?General: Awake, no distress.  ?CV:  Good peripheral perfusion.  2+ radial pulses. ?Resp:  Normal effort.  Bilaterally. ?Abd:  No distention.  Throughout. ?Other:  Because membranes.  Cranial nerves II to XII are grossly intact.  Patient moves all extremities spontaneously.  No CVA tenderness. ? ? ?ED Results / Procedures / Treatments  ?Labs ?(all labs ordered are listed, but only abnormal results are displayed) ?Labs Reviewed  ?COMPREHENSIVE METABOLIC PANEL - Abnormal; Notable for the following components:  ?    Result Value  ? Chloride 97 (*)   ? Calcium 10.4 (*)   ? Total Protein 8.2 (*)   ? All other components within normal limits  ?CBC WITH DIFFERENTIAL/PLATELET - Abnormal; Notable for the following components:  ? RBC 5.51 (*)   ? HCT 46.3 (*)   ? All  other components within normal limits  ?URINALYSIS, ROUTINE W REFLEX MICROSCOPIC - Abnormal; Notable for the following components:  ? Color, Urine YELLOW (*)   ? APPearance HAZY (*)   ? Hgb urine dipstick SMALL (*)   ? Ketones, ur 5 (*)   ? Protein, ur 100 (*)   ? All other components within normal limits  ?LIPASE, BLOOD  ?LAMOTRIGINE LEVEL  ? ? ? ?EKG ? ?ECG shows sinus rhythm with a ventricular rate of 76, normal axis, unremarkable intervals without clear evidence of acute ischemia or significant arrhythmia. ? ? ?RADIOLOGY ?CT abdomen pelvis on my review without evidence of appendicitis, SBO, pancreatitis, cholecystitis or ileus.  I reviewed radiology interpretation and agree with the findings of no acute abdominal pelvic process and possibly slightly thickened bladder wall. ? ? ? ?PROCEDURES: ? ?Critical Care performed: No ? ?Procedures ? ? ? ?MEDICATIONS ORDERED IN ED: ?Medications  ?ondansetron (ZOFRAN-ODT) disintegrating tablet 4 mg (4 mg Oral Given 06/27/21 1954)  ?iohexol (OMNIPAQUE) 300 MG/ML solution  100 mL (100 mLs Intravenous Contrast Given 06/27/21 2039)  ? ? ? ?IMPRESSION / MDM / ASSESSMENT AND PLAN / ED COURSE  ?I reviewed the triage vital signs and the nursing notes. ?             ?               ? ?Differential diagnosis includes, but is not limited to ACS, SBO, metabolic derangements, gastritis.  No clear acute focal deficits or complaint of headache to suggest acute increased intracranial pressure. ? ?ECG is not suggestive of atypical angina. ? ?CT abdomen pelvis on my review without evidence of appendicitis, SBO, pancreatitis, cholecystitis or ileus.  I reviewed radiology interpretation and agree with the findings of no acute abdominal pelvic process and possibly slightly thickened bladder wall. ? ?CBC without leukocytosis or acute anemia.  Lipase not suggestive of pancreatitis.  CMP without any significant electrolyte or metabolic derangements.  UA has some ketones protein and a small hemoglobin but otherwise does not appear infected. ? ?Discussed with sweats and patient to emergency room findings of protein and blood in urine of unclear etiology but the patient is currently able tolerate p.o. I think is stable for discharge with outpatient continued evaluation with PCP and GI.  I do not believe she requires hospitalization at this time.  Patient has no other acute concerns and is able tolerate water without any difficulty with emergency room.  Discharged stable condition.  Strict return precautions advised and discussed.  After discussion with daughter did send electrolyte to level which I advised they can follow-up with patient's PCP outpatient sure she is not receiving excessive amounts of this that could be contributing to how she is feeling. ? ?  ? ? ?FINAL CLINICAL IMPRESSION(S) / ED DIAGNOSES  ? ?Final diagnoses:  ?Nausea vomiting and diarrhea  ?Hypertension, unspecified type  ? ? ? ?Rx / DC Orders  ? ?ED Discharge Orders   ? ?      Ordered  ?  ondansetron (ZOFRAN-ODT) 4 MG disintegrating tablet   Every 8 hours PRN       ? 06/27/21 2212  ? ?  ?  ? ?  ? ? ? ?Note:  This document was prepared using Dragon voice recognition software and may include unintentional dictation errors. ?  ?Lucrezia Starch, MD ?06/27/21 2214 ? ?

## 2021-06-27 NOTE — ED Provider Triage Note (Signed)
Emergency Medicine Provider Triage Evaluation Note ? ?Sandra Charles , a 66 y.o. female  was evaluated in triage.  Pt complains of nausea and vomiting earlier today.  Patient presents being brought in by her niece for complaint of nausea and vomiting.  Patient currently states that she does not feel nauseated, has no abdominal pain.  She is very vague on her answers.  Denies any recent illnesses.  No chronic GI complaints.. ? ?Review of Systems  ?Positive: Nausea, vomiting earlier today, apparently now resolved ?Negative: Fever, chills, URI symptoms, chest pain, diarrhea, abdominal pain ? ?Physical Exam  ?BP (!) 179/80 (BP Location: Left Arm)   Pulse 73   Temp 97.7 ?F (36.5 ?C) (Oral)   Resp 20   Ht '5\' 2"'$  (1.575 m)   Wt 81.6 kg   LMP  (LMP Unknown)   SpO2 93%   BMI 32.92 kg/m?  ?Gen:   Awake, no distress   ?Resp:  Normal effort  ?MSK:   Moves extremities without difficulty  ?Other:  Bowel sounds present.  No tenderness on exam. ? ?Medical Decision Making  ?Medically screening exam initiated at 6:06 PM.  Appropriate orders placed.  Sandra Charles was informed that the remainder of the evaluation will be completed by another provider, this initial triage assessment does not replace that evaluation, and the importance of remaining in the ED until their evaluation is complete. ? ?Patient presents for complaint of nausea and vomiting earlier today.  Apparently it is resolved at this time.  Patient is vague on her answers but apparently no fevers, chills, nasal congestion, sore throat, cough, chest pain, urinary changes, diarrhea or constipation.  Patient will have labs at this time. ?  ?Darletta Moll, PA-C ?06/27/21 1806 ? ?

## 2021-06-27 NOTE — ED Triage Notes (Signed)
Pt arrives with c/o n/v that started earlier today. Pt denies ABD pain.  ?

## 2021-06-27 NOTE — Discharge Instructions (Addendum)
Please have your blood pressure rechecked when you follow-up with your primary care doctor. ?

## 2021-06-29 LAB — LAMOTRIGINE LEVEL: Lamotrigine Lvl: 8 ug/mL (ref 2.0–20.0)

## 2021-07-01 ENCOUNTER — Emergency Department (EMERGENCY_DEPARTMENT_HOSPITAL)
Admission: EM | Admit: 2021-07-01 | Discharge: 2021-07-02 | Disposition: A | Discharge status: Other Acute Inpatient Hospital | Payer: Medicare Other | Source: Home / Self Care | Attending: Emergency Medicine | Admitting: Emergency Medicine

## 2021-07-01 ENCOUNTER — Other Ambulatory Visit: Payer: Self-pay

## 2021-07-01 DIAGNOSIS — Z046 Encounter for general psychiatric examination, requested by authority: Secondary | ICD-10-CM | POA: Insufficient documentation

## 2021-07-01 DIAGNOSIS — Z7982 Long term (current) use of aspirin: Secondary | ICD-10-CM | POA: Insufficient documentation

## 2021-07-01 DIAGNOSIS — F250 Schizoaffective disorder, bipolar type: Secondary | ICD-10-CM | POA: Insufficient documentation

## 2021-07-01 DIAGNOSIS — Z20822 Contact with and (suspected) exposure to covid-19: Secondary | ICD-10-CM | POA: Insufficient documentation

## 2021-07-01 DIAGNOSIS — R440 Auditory hallucinations: Secondary | ICD-10-CM

## 2021-07-01 DIAGNOSIS — E039 Hypothyroidism, unspecified: Secondary | ICD-10-CM | POA: Insufficient documentation

## 2021-07-01 LAB — COMPREHENSIVE METABOLIC PANEL
ALT: 41 U/L (ref 0–44)
AST: 42 U/L — ABNORMAL HIGH (ref 15–41)
Albumin: 4.6 g/dL (ref 3.5–5.0)
Alkaline Phosphatase: 96 U/L (ref 38–126)
Anion gap: 15 (ref 5–15)
BUN: 25 mg/dL — ABNORMAL HIGH (ref 8–23)
CO2: 25 mmol/L (ref 22–32)
Calcium: 10.2 mg/dL (ref 8.9–10.3)
Chloride: 97 mmol/L — ABNORMAL LOW (ref 98–111)
Creatinine, Ser: 0.92 mg/dL (ref 0.44–1.00)
GFR, Estimated: >60 mL/min (ref >60)
Glucose, Bld: 94 mg/dL (ref 70–99)
Potassium: 3.4 mmol/L — ABNORMAL LOW (ref 3.5–5.1)
Sodium: 137 mmol/L (ref 135–145)
Total Bilirubin: 1 mg/dL (ref 0.3–1.2)
Total Protein: 8.8 g/dL — ABNORMAL HIGH (ref 6.5–8.1)

## 2021-07-01 LAB — CBC
HCT: 45.8 % (ref 36.0–46.0)
Hemoglobin: 14.6 g/dL (ref 12.0–15.0)
MCH: 26.9 pg (ref 26.0–34.0)
MCHC: 31.9 g/dL (ref 30.0–36.0)
MCV: 84.3 fL (ref 80.0–100.0)
Platelets: 199 10*3/uL (ref 150–400)
RBC: 5.43 MIL/uL — ABNORMAL HIGH (ref 3.87–5.11)
RDW: 14.5 % (ref 11.5–15.5)
WBC: 9.2 10*3/uL (ref 4.0–10.5)
nRBC: 0 % (ref 0.0–0.2)

## 2021-07-01 LAB — ACETAMINOPHEN LEVEL: Acetaminophen (Tylenol), Serum: <10 ug/mL — ABNORMAL LOW (ref 10–30)

## 2021-07-01 LAB — SALICYLATE LEVEL: Salicylate Lvl: <7 mg/dL — ABNORMAL LOW (ref 7.0–30.0)

## 2021-07-01 LAB — ETHANOL: Alcohol, Ethyl (B): <10 mg/dL (ref <10)

## 2021-07-01 MED ORDER — SEMAGLUTIDE(0.25 OR 0.5MG/DOS) 2 MG/1.5ML ~~LOC~~ SOPN: 0.2500 mg | PEN_INJECTOR | SUBCUTANEOUS | Status: DC

## 2021-07-01 MED ORDER — CLOBAZAM 10 MG PO TABS
5.0000 mg | ORAL_TABLET | Freq: Every day | ORAL | Status: DC
Start: 2021-07-02 — End: 2021-07-02

## 2021-07-01 MED ORDER — DIVALPROEX SODIUM 250 MG PO DR TAB
250.0000 mg | DELAYED_RELEASE_TABLET | Freq: Two times a day (BID) | ORAL | Status: DC
  Administered 2021-07-02: 250 mg via ORAL
  Filled 2021-07-01 (×2): qty 1

## 2021-07-01 MED ORDER — DONEPEZIL HCL 5 MG PO TABS
10.0000 mg | ORAL_TABLET | Freq: Every day | ORAL | Status: DC
Start: 2021-07-01 — End: 2021-07-02
  Filled 2021-07-01: qty 2

## 2021-07-01 MED ORDER — IBUPROFEN 800 MG PO TABS: 800.0000 mg | ORAL_TABLET | Freq: Four times a day (QID) | ORAL | Status: DC | PRN

## 2021-07-01 MED ORDER — SERTRALINE HCL 50 MG PO TABS
100.0000 mg | ORAL_TABLET | Freq: Every day | ORAL | Status: DC
Start: 2021-07-01 — End: 2021-07-02
  Administered 2021-07-02: 100 mg via ORAL
  Filled 2021-07-01: qty 2

## 2021-07-01 MED ORDER — ASPIRIN EC 81 MG PO TBEC
81.0000 mg | DELAYED_RELEASE_TABLET | Freq: Every day | ORAL | Status: DC
Start: 2021-07-01 — End: 2021-07-02
  Administered 2021-07-02: 81 mg via ORAL
  Filled 2021-07-01: qty 1

## 2021-07-01 MED ORDER — ASPIRIN EC 325 MG PO TBEC: 325.0000 mg | DELAYED_RELEASE_TABLET | Freq: Every day | ORAL | Status: DC

## 2021-07-01 MED ORDER — PANTOPRAZOLE SODIUM 40 MG PO TBEC
40.0000 mg | DELAYED_RELEASE_TABLET | Freq: Every day | ORAL | Status: DC
  Administered 2021-07-02: 40 mg via ORAL
  Filled 2021-07-01: qty 1

## 2021-07-01 MED ORDER — ROSUVASTATIN CALCIUM 20 MG PO TABS
20.0000 mg | ORAL_TABLET | Freq: Every day | ORAL | Status: DC
  Filled 2021-07-01: qty 1

## 2021-07-01 MED ORDER — CLONIDINE HCL 0.1 MG PO TABS
0.1000 mg | ORAL_TABLET | Freq: Two times a day (BID) | ORAL | Status: DC
  Administered 2021-07-02: 0.1 mg via ORAL
  Filled 2021-07-01 (×2): qty 1

## 2021-07-01 MED ORDER — AMLODIPINE BESYLATE 5 MG PO TABS
10.0000 mg | ORAL_TABLET | Freq: Every day | ORAL | Status: DC
Start: 2021-07-01 — End: 2021-07-02
  Administered 2021-07-02: 10 mg via ORAL
  Filled 2021-07-01: qty 2

## 2021-07-01 MED ORDER — PROPRANOLOL HCL ER 60 MG PO CP24
60.0000 mg | ORAL_CAPSULE | Freq: Every day | ORAL | Status: DC
  Administered 2021-07-02: 60 mg via ORAL
  Filled 2021-07-01: qty 1

## 2021-07-01 MED ORDER — VITAMIN B-12 1000 MCG PO TABS
1000.0000 ug | ORAL_TABLET | Freq: Every day | ORAL | Status: DC
  Administered 2021-07-02: 1000 ug via ORAL
  Filled 2021-07-01: qty 1

## 2021-07-01 MED ORDER — LAMOTRIGINE 100 MG PO TABS
125.0000 mg | ORAL_TABLET | Freq: Two times a day (BID) | ORAL | Status: DC
Start: 2021-07-01 — End: 2021-07-02
  Administered 2021-07-02: 125 mg via ORAL
  Filled 2021-07-01 (×2): qty 1

## 2021-07-01 MED ORDER — QUETIAPINE FUMARATE 25 MG PO TABS
25.0000 mg | ORAL_TABLET | Freq: Once | ORAL | Status: DC
  Filled 2021-07-01: qty 1

## 2021-07-01 MED ORDER — QUETIAPINE FUMARATE ER 50 MG PO TB24
50.0000 mg | ORAL_TABLET | Freq: Every day | ORAL | Status: DC
  Filled 2021-07-01: qty 1

## 2021-07-01 NOTE — ED Notes (Addendum)
Pt dressed into hospital scrubs, belongings to include: ? ?1 pair white tennis shoes ?1 pair white socks ?1 pair black pants ?1 purple t shirt ?1 pair pink underwear ?1 black jacket ?

## 2021-07-01 NOTE — ED Notes (Signed)
RN attempted to swab pt for COVID. Pt refusing swab.  ?

## 2021-07-01 NOTE — ED Notes (Signed)
Patient continues to refuse to take medications, even after this RN attempted to explain need especially for seizure and hypertension medication.  Patient instructed to let this RN know if she changes her mind and wants to take medications. ?

## 2021-07-01 NOTE — Consult Note (Signed)
[facility] Face-to-Face Psychiatry Consult   Reason for Consult:  hallucinations Referring Physician:  EDP Patient Identification: Sandra Charles MRN:  [id number] Principal Diagnosis: <principal problem not specified> Diagnosis:  Active Problems:   Schizoaffective disorder, bipolar type ([place])   Total Time spent with patient: 45 minutes  Subjective:   Sandra Charles is a 66 y.o. female patient admitted with hallucinations.  HPI:  66 yo female with a history of schizoaffective disorder, bipolar type.  Her niece brought her to the hospital after she stopped taking her medications and became paranoid.  She was irritable on assessment, not aggressive.  Refused many of the questions and told this provider, "It's in the chart, read it."   Based on the family concerns and her irritability, admission needed for geriatric psych for stabilization.  Past Psychiatric History: schizoaffective disorder, bipolar type  Risk to Self:  yes Risk to Others:  none Prior Inpatient Therapy:  yes Prior Outpatient Therapy:  unknown  Past Medical History:  Past Medical History:  Diagnosis Date   Anxiety    Arthritis    "all over"   Chronic lower back pain    Depression    GERD (gastroesophageal reflux disease)    Headache    "weekly" (07/15/2015)   Hyperlipidemia    Hypertension    Mini stroke    "several since 05/2014" (07/15/2015)   Seizures ([place]) dx'd 04/2015    Past Surgical History:  Procedure Laterality Date   DILATION AND CURETTAGE OF UTERUS     LUMBAR PUNCTURE  07/14/2015   ORIF ANKLE FRACTURE Right 01/20/2021   Procedure: OPEN REDUCTION INTERNAL FIXATION (ORIF) ANKLE FRACTURE;  Surgeon: Corky Mull, MD;  Location: [facility] ORS;  Service: Orthopedics;  Laterality: Right;   TUBAL LIGATION     VAGINAL HYSTERECTOMY     Family History:  Family History  Problem Relation Age of Onset   Hyperlipidemia Mother    Breast cancer Mother 41   Hypertension Mother    Heart attack Mother        age 58's    Hyperlipidemia Father    Hypertension Father    Heart attack Father 82   Breast cancer Sister 72   Family Psychiatric  History: see above Social History:  Social History   Substance and Sexual Activity  Alcohol Use Not Currently   Comment: 07/15/2015 "nothing in the last few years"     Social History   Substance and Sexual Activity  Drug Use No    Social History   Socioeconomic History   Marital status: Single    Spouse name: Not on file   Number of children: 1   Years of education: Not on file   Highest education level: Not on file  Occupational History   Occupation: disabled  Tobacco Use   Smoking status: Former    Packs/day: 1.00    Years: 5.00    Pack years: 5.00    Types: Cigarettes   Smokeless tobacco: Never   Tobacco comments:    "quit smoking cigarettes in the early 2000s"  Substance and Sexual Activity   Alcohol use: Not Currently    Comment: 07/15/2015 "nothing in the last few years"   Drug use: No   Sexual activity: Not Currently  Other Topics Concern   Not on file  Social History Narrative   Lives with significant other, has son who comes by daily.   Social Determinants of Health   Financial Resource Strain: Not on file  Food Insecurity:  Not on file  Transportation Needs: Not on file  Physical Activity: Not on file  Stress: Not on file  Social Connections: Not on file   Additional Social History:    Allergies:   Allergies  Allergen Reactions   Lovastatin Other (See Comments)    Unknown reaction    Labs:  Results for orders placed or performed during the hospital encounter of 07/01/21 (from the past 48 hour(s))  Comprehensive metabolic panel     Status: Abnormal   Collection Time: 07/01/21  3:43 PM  Result Value Ref Range   Sodium 137 135 - 145 mmol/L   Potassium 3.4 (L) 3.5 - 5.1 mmol/L   Chloride 97 (L) 98 - 111 mmol/L   CO2 25 22 - 32 mmol/L   Glucose, Bld 94 70 - 99 mg/dL    Comment: Glucose reference range applies only to  samples taken after fasting for at least 8 hours.   BUN 25 (H) 8 - 23 mg/dL   Creatinine, Ser 0.92 0.44 - 1.00 mg/dL   Calcium 10.2 8.9 - 10.3 mg/dL   Total Protein 8.8 (H) 6.5 - 8.1 g/dL   Albumin 4.6 3.5 - 5.0 g/dL   AST 42 (H) 15 - 41 U/L   ALT 41 0 - 44 U/L   Alkaline Phosphatase 96 38 - 126 U/L   Total Bilirubin 1.0 0.3 - 1.2 mg/dL   GFR, Estimated >60 >60 mL/min    Comment: (NOTE) Calculated using the CKD-EPI Creatinine Equation (2021)    Anion gap 15 5 - 15    Comment: Performed at [facility], [address] [place], [place] [address]  Ethanol     Status: None   Collection Time: 07/01/21  3:43 PM  Result Value Ref Range   Alcohol, Ethyl (B) <10 <10 mg/dL    Comment: (NOTE) Lowest detectable limit for serum alcohol is 10 mg/dL.  For medical purposes only. Performed at [facility], [address] [place], [place] [address]   Salicylate level     Status: Abnormal   Collection Time: 07/01/21  3:43 PM  Result Value Ref Range   Salicylate Lvl <5.4 (L) 7.0 - 30.0 mg/dL    Comment: Performed at [facility], [address] [place], [place] [address]  Acetaminophen level     Status: Abnormal   Collection Time: 07/01/21  3:43 PM  Result Value Ref Range   Acetaminophen (Tylenol), Serum <10 (L) 10 - 30 ug/mL    Comment: (NOTE) Therapeutic concentrations vary significantly. A range of 10-30 ug/mL  may be an effective concentration for many patients. However, some  are best treated at concentrations outside of this range. Acetaminophen concentrations >150 ug/mL at 4 hours after ingestion  and >50 ug/mL at 12 hours after ingestion are often associated with  toxic reactions.  Performed at [facility], [address] [place],  [address]   cbc     Status: Abnormal   Collection Time: 07/01/21  3:43 PM  Result Value Ref Range   WBC 9.2 4.0 - 10.5 K/uL   RBC 5.43 (H) 3.87 - 5.11 MIL/uL   Hemoglobin 14.6 12.0 - 15.0 g/dL    HCT 45.8 36.0 - 46.0 %   MCV 84.3 80.0 - 100.0 fL   MCH 26.9 26.0 - 34.0 pg   MCHC 31.9 30.0 - 36.0 g/dL   RDW 14.5 11.5 - 15.5 %   Platelets 199 150 - 400 K/uL   nRBC 0.0 0.0 -  0.2 %    Comment: Performed at [facility], [address] [place], [place] [address]    No current facility-administered medications for this encounter.   Current Outpatient Medications  Medication Sig Dispense Refill   amLODipine (NORVASC) 10 MG tablet Take 10 mg by mouth daily. For high blood pressure     aspirin EC 325 MG tablet Take 1 tablet (325 mg total) by mouth daily. 30 tablet 0   aspirin EC 81 MG tablet Take 81 mg by mouth daily. Swallow whole.     cloBAZam (ONFI) 10 MG tablet Take 0.5 tablets (5 mg total) by mouth at bedtime. 30 tablet 1   cloNIDine (CATAPRES) 0.1 MG tablet Take 1 tablet (0.1 mg total) by mouth 2 (two) times daily. 60 tablet 11   donepezil (ARICEPT) 10 MG tablet Take 10 mg by mouth at bedtime.     ibuprofen (ADVIL) 800 MG tablet Take 1 tablet by mouth every 6 (six) hours as needed.     lamoTRIgine (LAMICTAL) 100 MG tablet 100 mg in morning and 125 mg in evening. 60 tablet 0   OZEMPIC, 1 MG/DOSE, 4 MG/3ML SOPN Inject into the skin. Pt taking on fridays     pantoprazole (PROTONIX) 40 MG tablet Take 1 tablet (40 mg total) by mouth daily. 30 tablet 11   propranolol ER (INDERAL LA) 60 MG 24 hr capsule Take 60 mg by mouth daily.  11   QUEtiapine (SEROQUEL XR) 50 MG TB24 24 hr tablet Take 50 mg by mouth at bedtime.     rosuvastatin (CRESTOR) 20 MG tablet Take 20 mg by mouth at bedtime.     sertraline (ZOLOFT) 100 MG tablet Take 100 mg by mouth daily.  2   vitamin B-12 (CYANOCOBALAMIN) 1000 MCG tablet Take 1,000 mcg by mouth daily.     lamoTRIgine (LAMICTAL) 25 MG tablet Take 1 tablet (25 mg total) by mouth every evening. Along with 100 mg tablet for total of 125 mg in evening. (Patient taking differently: Take 125 mg by mouth 2 (two) times daily. Along with 100 mg tablet for  total of 125 mg in evening.) 60 tablet 0   OZEMPIC, 0.25 OR 0.5 MG/DOSE, 2 MG/1.5ML SOPN Inject 0.25 mg into the skin once a week. (Patient not taking: Reported on 05/01/2021)     traZODone (DESYREL) 100 MG tablet Take 1 tablet (100 mg total) by mouth at bedtime as needed for sleep. 30 tablet 0    Musculoskeletal: Strength & Muscle Tone: within normal limits Gait & Station: normal Patient leans: N/A  Psychiatric Specialty Exam: Physical Exam Vitals and nursing note reviewed.  Constitutional:      Appearance: Normal appearance.  HENT:     Head: Normocephalic.     Nose: Nose normal.  Pulmonary:     Effort: Pulmonary effort is normal.  Musculoskeletal:        General: Normal range of motion.     Cervical back: Normal range of motion.  Neurological:     General: No focal deficit present.     Mental Status: She is alert and oriented to person, place, and time.  Psychiatric:        Attention and Perception: Attention and perception normal.        Mood and Affect: Mood is anxious. Affect is blunt.        Speech: Speech normal.        Behavior: Behavior normal. Behavior is cooperative.        Thought  Content: Thought content is paranoid.        Cognition and Memory: Memory is impaired.        Judgment: Judgment normal.    Review of Systems  Psychiatric/Behavioral:  The patient is nervous/anxious.   All other systems reviewed and are negative.  Blood pressure (!) 173/80, pulse 86, temperature (!) 97.5 F (36.4 C), temperature source Oral, resp. rate 18, height '5\' 2"'$  (1.575 m), weight 81.6 kg, SpO2 98 %.Body mass index is 32.9 kg/m.  General Appearance: Casual  Eye Contact:  Fair  Speech:  Normal Rate  Volume:  Normal  Mood:  Anxious  Affect:  Blunt  Thought Process:  Coherent  Orientation:  Full (Time, Place, and Person)  Thought Content:  Paranoid Ideation  Suicidal Thoughts:  No  Homicidal Thoughts:  No  Memory:  Immediate;   Fair Recent;   Poor Remote;   Fair   Judgement:  Poor  Insight:  Lacking  Psychomotor Activity:  Normal  Concentration:  Concentration: Fair and Attention Span: Fair  Recall:  [employer] of Knowledge:  Fair  Language:  Good  Akathisia:  No  Handed:  Right  AIMS (if indicated):     Assets:  Housing Leisure Time Physical Health Resilience Social Support  ADL's:  Intact  Cognition:  WNL  Sleep:        Physical Exam: Physical Exam Vitals and nursing note reviewed.  Constitutional:      Appearance: Normal appearance.  HENT:     Head: Normocephalic.     Nose: Nose normal.  Pulmonary:     Effort: Pulmonary effort is normal.  Musculoskeletal:        General: Normal range of motion.     Cervical back: Normal range of motion.  Neurological:     General: No focal deficit present.     Mental Status: She is alert and oriented to person, place, and time.  Psychiatric:        Attention and Perception: Attention and perception normal.        Mood and Affect: Mood is anxious. Affect is blunt.        Speech: Speech normal.        Behavior: Behavior normal. Behavior is cooperative.        Thought Content: Thought content is paranoid.        Cognition and Memory: Memory is impaired.        Judgment: Judgment normal.   Review of Systems  Psychiatric/Behavioral:  The patient is nervous/anxious.   All other systems reviewed and are negative. Blood pressure (!) 173/80, pulse 86, temperature (!) 97.5 F (36.4 C), temperature source Oral, resp. rate 18, height '5\' 2"'$  (1.575 m), weight 81.6 kg, SpO2 98 %. Body mass index is 32.9 kg/m.  Treatment Plan Summary: Daily contact with patient to assess and evaluate symptoms and progress in treatment, Medication management, and Plan s: Schizoaffective disorder, bipolar type: Started Seroquel 25 mg once and 50 mg at bedtime STarted Depakote 250 mg BID Admit to geropsych  Disposition: Recommend psychiatric Inpatient admission when medically cleared.  Waylan Boga, NP 07/01/2021  5:34 PM

## 2021-07-01 NOTE — ED Triage Notes (Signed)
Pt has been refusing her medications- pt has paranoid schizophrenia- pt has been having auditory hallucinations ?

## 2021-07-01 NOTE — ED Notes (Signed)
Pt given phone at this time. Seems to have worked herself up while talking with niece. Pt pacing and stating that "she was not signing anything" and "we were asking her too many questions. Everything is in the system." Pt told to wrap up phone call and return to bed. Pt compliant with hanging up phone and sitting back on bed. ?

## 2021-07-01 NOTE — ED Provider Notes (Signed)
? ?[facility] ?Provider Note ? ? Event Date/Time  ? First MD Initiated Contact with Patient 07/01/21 1551   ?  (approximate) ?History  ?Psychiatric Evaluation ? ?HPI ?Sandra Charles is a 66 y.o. female with a past medical history of paranoid schizophrenia who presents with her caregiver after patient has been refusing to take her normal medications and having increasing auditory hallucinations.  Further history and review of systems unable to be obtained at this time. ?Physical Exam  ?Triage Vital Signs: ?ED Triage Vitals  ?Enc Vitals Group  ?   BP 07/01/21 1524 (!) 173/80  ?   Pulse Rate 07/01/21 1524 86  ?   Resp 07/01/21 1524 18  ?   Temp 07/01/21 1524 (!) 97.5 ?F (36.4 ?C)  ?   Temp Source 07/01/21 1524 Oral  ?   SpO2 07/01/21 1524 98 %  ?   Weight 07/01/21 1522 179 lb 14.3 oz (81.6 kg)  ?   Height 07/01/21 1522 '5\' 2"'$  (1.575 m)  ?   Head Circumference --   ?   Peak Flow --   ?   Pain Score 07/01/21 1522 0  ?   Pain Loc --   ?   Pain Edu? --   ?   Excl. in [place]? --   ? ?Most recent vital signs: ?Vitals:  ? 07/01/21 1524 07/01/21 2139  ?BP: (!) 173/80 (!) 204/99  ?Pulse: 86 97  ?Resp: 18 18  ?Temp: (!) 97.5 ?F (36.4 ?C)   ?SpO2: 98% 99%  ? ?General: Awake, oriented x4. ?CV:  Good peripheral perfusion.  ?Resp:  Normal effort.  ?Abd:  No distention.  ?Other:  Disheveled elderly African-American female laying in bed in no distress ?ED Results / Procedures / Treatments  ?Labs ?(all labs ordered are listed, but only abnormal results are displayed) ?Labs Reviewed  ?COMPREHENSIVE METABOLIC PANEL - Abnormal; Notable for the following components:  ?    Result Value  ? Potassium 3.4 (*)   ? Chloride 97 (*)   ? BUN 25 (*)   ? Total Protein 8.8 (*)   ? AST 42 (*)   ? All other components within normal limits  ?SALICYLATE LEVEL - Abnormal; Notable for the following components:  ? Salicylate Lvl <3.8 (*)   ? All other components within normal limits  ?ACETAMINOPHEN LEVEL - Abnormal; Notable for the following  components:  ? Acetaminophen (Tylenol), Serum <10 (*)   ? All other components within normal limits  ?CBC - Abnormal; Notable for the following components:  ? RBC 5.43 (*)   ? All other components within normal limits  ?RESP PANEL BY RT-PCR (FLU A&B, COVID) ARPGX2  ?ETHANOL  ?URINE DRUG SCREEN, QUALITATIVE ([facility] ONLY)  ? ?PROCEDURES: ?Critical Care performed: No ?Procedures ?MEDICATIONS ORDERED IN ED: ?Medications  ?QUEtiapine (SEROQUEL XR) 24 hr tablet 50 mg (50 mg Oral Patient Refused/Not Given 07/01/21 2341)  ?QUEtiapine (SEROQUEL) tablet 25 mg (25 mg Oral Patient Refused/Not Given 07/01/21 2341)  ?divalproex (DEPAKOTE) DR tablet 250 mg (250 mg Oral Patient Refused/Not Given 07/01/21 2341)  ?amLODipine (NORVASC) tablet 10 mg (10 mg Oral Patient Refused/Not Given 07/01/21 2343)  ?aspirin EC tablet 81 mg (81 mg Oral Patient Refused/Not Given 07/01/21 2343)  ?cloBAZam (ONFI) tablet 5 mg (has no administration in time range)  ?cloNIDine (CATAPRES) tablet 0.1 mg (0.1 mg Oral Patient Refused/Not Given 07/01/21 2342)  ?donepezil (ARICEPT) tablet 10 mg (10 mg Oral Patient Refused/Not Given 07/01/21 2342)  ?ibuprofen (ADVIL) tablet  800 mg (has no administration in time range)  ?lamoTRIgine (LAMICTAL) tablet 125 mg (125 mg Oral Patient Refused/Not Given 07/01/21 2342)  ?pantoprazole (PROTONIX) EC tablet 40 mg (40 mg Oral Patient Refused/Not Given 07/01/21 2343)  ?propranolol ER (INDERAL LA) 24 hr capsule 60 mg (has no administration in time range)  ?rosuvastatin (CRESTOR) tablet 20 mg (20 mg Oral Patient Refused/Not Given 07/01/21 2342)  ?sertraline (ZOLOFT) tablet 100 mg (100 mg Oral Patient Refused/Not Given 07/01/21 2342)  ?vitamin B-12 (CYANOCOBALAMIN) tablet 1,000 mcg (has no administration in time range)  ? ?IMPRESSION / MDM / ASSESSMENT AND PLAN / ED COURSE  ?I reviewed the triage vital signs and the nursing notes. ?             ?               ? ?Patient presents under IVC for hallucinations/delusions. Thoughts are disorganized. ?No  history of prior suicide attempt, and no SI or HI at this time. ?Clinically w/ no overt toxidrome, low suspicion for ingestion given hx and exam Thoughts unlikely 2/2 anemia, hypothyroidism, infection, or ICH. ?Patient?s decision making capacity is compromised and they are unable to perform all ADL?s (additionally they are without appropriate caretakers to assist through this deficit). ? ?Consult: Psychiatry to evaluate patient for grave disability ?Disposition: Pending psychiatric evaluation ? ?Care of this patient will be signed out the oncoming physician.  All pertinent patient formation is conveyed and all questions answered.  All further care and disposition decisions will be made by the oncoming physician. ? ?  ?FINAL CLINICAL IMPRESSION(S) / ED DIAGNOSES  ? ?Final diagnoses:  ?Auditory hallucinations  ? ?Rx / DC Orders  ? ?ED Discharge Orders   ? ? None  ? ?  ? ?Note:  This document was prepared using Dragon voice recognition software and may include unintentional dictation errors. ?  ?Naaman Plummer, MD ?07/01/21 2344 ? ?

## 2021-07-01 NOTE — ED Notes (Signed)
Pt is refusing all meds and further testing.  JL made aware.  ?

## 2021-07-01 NOTE — ED Notes (Signed)
Spoke with patient again in an attempt to get patient to take medications.  Showed patient unopened medications but patient still refused to take them. ?

## 2021-07-01 NOTE — BH Assessment (Signed)
Comprehensive Clinical Assessment (CCA) Note ? ?07/01/2021 ?Sandra Charles ?[id number] ? ?Chief Complaint:  ?Chief Complaint  ?Patient presents with  ? Psychiatric Evaluation  ? ?[name] is a 66 year old Female.  She arrived to the ED by way of personal transportation with her niece Sandra Charles [phone]).  [name] appeared scattered, confused,  and had difficulty answering questions.  She was unable to provide her name for an excess of 5 minutes.  She later identified herself as ?Sandra Charles?.  Ms. Adolph could not identify why she was at the hospital but stated that she was here in the past for her ankle. Ms. Ramey provided limited information for the assessment.  ?  ?TTS spoke with Sandra Charles. She reported that [name] has been refusing to take her medications.  She identified that [name] has been acting paranoid and hearing things.   She identified that this happens every 3-4 months.  She shared that [name] has been saying that people are talking about her and that she is hearing music.  In addition, [name] has a history of seizures and has been off the medicine. Seizure activity is suspected for earlier in the day.  [name] reports that [name] woke up one day 4  years ago and she could not remember anyone.  She identified that [name] has had memory issues since that time.  [name] went on to share that when [name] is on her medication, she appears to manage her symptoms well. ? ?Visit Diagnosis: Schizoaffective Disorder ? ? ?CCA Screening, Triage and Referral (STR) ? ?Patient Reported Information ?How did you hear about [place]? Family/Friend ? ?Referral name: No data recorded ?Referral phone number: No data recorded ? ?Whom do you see for routine medical problems? No data recorded ?Practice/Facility Name: No data recorded ?Practice/Facility Phone Number: No data recorded ?Name of Contact: No data recorded ?Contact Number: No data recorded ?Contact Fax Number: No data recorded ?Prescriber  Name: No data recorded ?Prescriber Address (if known): No data recorded ? ?What Is the Reason for Your Visit/Call Today? Auditory hallucinations, not taking her medication for medical or mental health concerns ? ?How Long Has This Been Causing You Problems? 1 wk - 1 month ? ?What Do You Feel Would Help You the Most Today? No data recorded ? ?Have You Recently Been in Any Inpatient Treatment (Hospital/Detox/Crisis Center/28-Day Program)? No data recorded ?Name/Location of Program/Hospital:No data recorded ?How Long Were You There? No data recorded ?When Were You Discharged? No data recorded ? ?Have You Ever Received Services From [facility] Before? No data recorded ?Who Do You See at [facility]? No data recorded ? ?Have You Recently Had Any Thoughts About Hurting Yourself? No ? ?Are You Planning to Commit Suicide/Harm Yourself At This time? No ? ? ?Have you Recently Had Thoughts About [place]? No ? ?Explanation: No data recorded ? ?Have You Used Any Alcohol or Drugs in the Past 24 Hours? No ? ?How Long Ago Did You Use Drugs or Alcohol? No data recorded ?What Did You Use and How Much? No data recorded ? ?Do You Currently Have a Therapist/Psychiatrist? Yes ? ?Name of Therapist/Psychiatrist: [facility] ? ? ?Have You Been Recently Discharged From Any Office Practice or Programs? No ? ?Explanation of Discharge From Practice/Program: No data recorded ? ?  ?CCA Screening Triage Referral Assessment ?Type of Contact: Face-to-Face ? ?Is this Initial or Reassessment? No data recorded ?Date Telepsych consult ordered in [facility]:  No  data recorded ?Time Telepsych consult ordered in [facility]L:  No data recorded ? ?Patient Reported Information Reviewed? No data recorded ?Patient Left Without Being Seen? No data recorded ?Reason for Not Completing Assessment: No data recorded ? ?Collateral Involvement: Sandra Charles (Niece) - [phone] ? ? ?Does Patient Have a Stage manager Guardian? No data recorded ?Name and  Contact of Legal Guardian: No data recorded ?If Minor and Not Living with Parent(s), Who has Custody? No data recorded ?Is CPS involved or ever been involved? No data recorded ?Is APS involved or ever been involved? Never ? ? ?Patient Determined To Be At Risk for Harm To Self or Others Based on Review of Patient Reported Information or Presenting Complaint? No ? ?Method: No data recorded ?Availability of Means: No data recorded ?Intent: No data recorded ?Notification Required: No data recorded ?Additional Information for Danger to Others Potential: No data recorded ?Additional Comments for Danger to Others Potential: No data recorded ?Are There Guns or Other Weapons in [place]? No data recorded ?Types of Guns/Weapons: No data recorded ?Are These Weapons Safely Secured?                            No data recorded ?Who Could Verify You Are Able To Have These Secured: No data recorded ?Do You Have any Outstanding Charges, Pending Court Dates, Parole/Probation? No data recorded ?Contacted To Inform of Risk of Harm To Self or Others: No data recorded ? ?Location of Assessment: [facility] ED ? ? ?Does Patient Present under Involuntary Commitment? No ? ?IVC Papers Initial File Date: No data recorded ? ?[place] of Residence: [place] ? ? ?Patient Currently Receiving the Following Services: Individual Therapy ? ? ?Determination of Need: No data recorded ? ?Options For Referral: Inpatient Hospitalization ? ? ? ? ?CCA Biopsychosocial ?Intake/Chief Complaint:  No data recorded ?Current Symptoms/Problems: No data recorded ? ?Patient Reported Schizophrenia/Schizoaffective Diagnosis in Past: Yes ? ? ?Strengths: No data recorded ?Preferences: No data recorded ?Abilities: No data recorded ? ?Type of Services Patient Feels are Needed: No data recorded ? ?Initial Clinical Notes/Concerns: No data recorded ? ?Mental Health Symptoms ?Depression:  No data recorded  ?Duration of Depressive symptoms: No data recorded  ?Mania:  No data recorded   ?Anxiety:   No data recorded  ?Psychosis:  No data recorded  ?Duration of Psychotic symptoms:  ?Greater than six months ?  ?Trauma:  No data recorded  ?Obsessions:  No data recorded  ?Compulsions:  No data recorded  ?Inattention:  No data recorded  ?Hyperactivity/Impulsivity:  No data recorded  ?Oppositional/Defiant Behaviors:  No data recorded  ?Emotional Irregularity:  No data recorded  ?Other Mood/Personality Symptoms:  No data recorded  ? ?Mental Status Exam ?Appearance and self-care  ?Stature:  No data recorded  ?Weight:  No data recorded  ?Clothing:  No data recorded  ?Grooming:  No data recorded  ?Cosmetic use:  No data recorded  ?Posture/gait:  No data recorded  ?Motor activity:  No data recorded  ?Sensorium  ?Attention:  No data recorded  ?Concentration:  No data recorded  ?Orientation:  No data recorded  ?Recall/memory:  No data recorded  ?Affect and Mood  ?Affect:  No data recorded  ?Mood:  No data recorded  ?Relating  ?Eye contact:  No data recorded  ?Facial expression:  No data recorded  ?Attitude toward examiner:  No data recorded  ?Thought and Language  ?Speech flow: No data recorded  ?Thought content:  No data  recorded  ?Preoccupation:  No data recorded  ?Hallucinations:  No data recorded  ?Organization:  No data recorded  ?Executive Functions  ?Fund of Knowledge:  No data recorded  ?Intelligence:  No data recorded  ?Abstraction:  No data recorded  ?Judgement:  No data recorded  ?Reality Testing:  No data recorded  ?Insight:  No data recorded  ?Decision Making:  No data recorded  ?Social Functioning  ?Social Maturity:  No data recorded  ?Social Judgement:  No data recorded  ?Stress  ?Stressors:  No data recorded  ?Coping Ability:  No data recorded  ?Skill Deficits:  No data recorded  ?Supports:  No data recorded  ? ? ?Religion: ?  ? ?Leisure/Recreation: ?  ? ?Exercise/Diet: ?  ? ? ?CCA Employment/Education ?Employment/Work Situation: ?Employment / Work Situation ?Employment Situation: On  disability ?Has Patient ever Been in the Military?: No ? ?Education: ?  ? ? ?CCA Family/Childhood History ?Family and Relationship History: ?Family history ?Does patient have children?: Yes ? ?Childhood History:

## 2021-07-02 ENCOUNTER — Inpatient Hospital Stay
Admission: AD | Admit: 2021-07-02 | Discharge: 2021-07-07 | DRG: 885 | Disposition: A | Discharge status: Home / Self Care | Payer: Medicare Other | Source: Intra-hospital | Attending: Psychiatry | Admitting: Psychiatry

## 2021-07-02 DIAGNOSIS — K219 Gastro-esophageal reflux disease without esophagitis: Secondary | ICD-10-CM | POA: Diagnosis present

## 2021-07-02 DIAGNOSIS — Z8673 Personal history of transient ischemic attack (TIA), and cerebral infarction without residual deficits: Secondary | ICD-10-CM | POA: Diagnosis not present

## 2021-07-02 DIAGNOSIS — Z90710 Acquired absence of both cervix and uterus: Secondary | ICD-10-CM | POA: Diagnosis not present

## 2021-07-02 DIAGNOSIS — G8929 Other chronic pain: Secondary | ICD-10-CM | POA: Diagnosis present

## 2021-07-02 DIAGNOSIS — I10 Essential (primary) hypertension: Secondary | ICD-10-CM | POA: Diagnosis present

## 2021-07-02 DIAGNOSIS — F419 Anxiety disorder, unspecified: Secondary | ICD-10-CM | POA: Diagnosis present

## 2021-07-02 DIAGNOSIS — M545 Low back pain, unspecified: Secondary | ICD-10-CM | POA: Diagnosis present

## 2021-07-02 DIAGNOSIS — G40909 Epilepsy, unspecified, not intractable, without status epilepticus: Secondary | ICD-10-CM | POA: Diagnosis present

## 2021-07-02 DIAGNOSIS — Z7985 Long-term (current) use of injectable non-insulin antidiabetic drugs: Secondary | ICD-10-CM | POA: Diagnosis not present

## 2021-07-02 DIAGNOSIS — Z79891 Long term (current) use of opiate analgesic: Secondary | ICD-10-CM

## 2021-07-02 DIAGNOSIS — E785 Hyperlipidemia, unspecified: Secondary | ICD-10-CM | POA: Diagnosis present

## 2021-07-02 DIAGNOSIS — Z888 Allergy status to other drugs, medicaments and biological substances status: Secondary | ICD-10-CM | POA: Diagnosis not present

## 2021-07-02 DIAGNOSIS — Z20822 Contact with and (suspected) exposure to covid-19: Secondary | ICD-10-CM | POA: Diagnosis present

## 2021-07-02 DIAGNOSIS — R45851 Suicidal ideations: Secondary | ICD-10-CM | POA: Diagnosis present

## 2021-07-02 DIAGNOSIS — Z91148 Patient's other noncompliance with medication regimen for other reason: Secondary | ICD-10-CM | POA: Diagnosis not present

## 2021-07-02 DIAGNOSIS — Z8249 Family history of ischemic heart disease and other diseases of the circulatory system: Secondary | ICD-10-CM

## 2021-07-02 DIAGNOSIS — M1990 Unspecified osteoarthritis, unspecified site: Secondary | ICD-10-CM | POA: Diagnosis present

## 2021-07-02 DIAGNOSIS — Z7982 Long term (current) use of aspirin: Secondary | ICD-10-CM

## 2021-07-02 DIAGNOSIS — F250 Schizoaffective disorder, bipolar type: Secondary | ICD-10-CM | POA: Diagnosis present

## 2021-07-02 DIAGNOSIS — Z79899 Other long term (current) drug therapy: Secondary | ICD-10-CM | POA: Diagnosis not present

## 2021-07-02 LAB — RESP PANEL BY RT-PCR (FLU A&B, COVID) ARPGX2
Influenza A by PCR: NEGATIVE
Influenza B by PCR: NEGATIVE
SARS Coronavirus 2 by RT PCR: NEGATIVE

## 2021-07-02 MED ORDER — ROSUVASTATIN CALCIUM 5 MG PO TABS
20.0000 mg | ORAL_TABLET | Freq: Every day | ORAL | Status: DC
  Administered 2021-07-02 – 2021-07-06 (×5): 20 mg via ORAL
  Filled 2021-07-02 (×5): qty 4

## 2021-07-02 MED ORDER — IBUPROFEN 800 MG PO TABS
800.0000 mg | ORAL_TABLET | Freq: Four times a day (QID) | ORAL | Status: DC | PRN
  Administered 2021-07-02: 800 mg via ORAL
  Filled 2021-07-02: qty 1

## 2021-07-02 MED ORDER — CLOBAZAM 10 MG PO TABS
5.0000 mg | ORAL_TABLET | Freq: Every day | ORAL | Status: DC
  Administered 2021-07-02 – 2021-07-06 (×5): 5 mg via ORAL
  Filled 2021-07-02 (×5): qty 1

## 2021-07-02 MED ORDER — PANTOPRAZOLE SODIUM 40 MG PO TBEC
40.0000 mg | DELAYED_RELEASE_TABLET | Freq: Every day | ORAL | Status: DC
  Administered 2021-07-03 – 2021-07-07 (×5): 40 mg via ORAL
  Filled 2021-07-02 (×5): qty 1

## 2021-07-02 MED ORDER — VITAMIN B-12 1000 MCG PO TABS
1000.0000 ug | ORAL_TABLET | Freq: Every day | ORAL | Status: DC
  Administered 2021-07-03 – 2021-07-07 (×5): 1000 ug via ORAL
  Filled 2021-07-02 (×5): qty 1

## 2021-07-02 MED ORDER — QUETIAPINE FUMARATE ER 50 MG PO TB24
50.0000 mg | ORAL_TABLET | Freq: Every day | ORAL | Status: DC
Start: 2021-07-02 — End: 2021-07-06
  Administered 2021-07-02 – 2021-07-05 (×4): 50 mg via ORAL
  Filled 2021-07-02 (×5): qty 1

## 2021-07-02 MED ORDER — AMLODIPINE BESYLATE 5 MG PO TABS
10.0000 mg | ORAL_TABLET | Freq: Every day | ORAL | Status: DC
  Administered 2021-07-03 – 2021-07-07 (×5): 10 mg via ORAL
  Filled 2021-07-02 (×5): qty 2

## 2021-07-02 MED ORDER — DONEPEZIL HCL 5 MG PO TABS
10.0000 mg | ORAL_TABLET | Freq: Every day | ORAL | Status: DC
  Administered 2021-07-02 – 2021-07-06 (×5): 10 mg via ORAL
  Filled 2021-07-02 (×5): qty 2

## 2021-07-02 MED ORDER — DIVALPROEX SODIUM 250 MG PO DR TAB
250.0000 mg | DELAYED_RELEASE_TABLET | Freq: Two times a day (BID) | ORAL | Status: DC
  Administered 2021-07-03 – 2021-07-07 (×9): 250 mg via ORAL
  Filled 2021-07-02 (×9): qty 1

## 2021-07-02 MED ORDER — ALUM & MAG HYDROXIDE-SIMETH 200-200-20 MG/5ML PO SUSP
30.0000 mL | ORAL | Status: DC | PRN
Start: 2021-07-02 — End: 2021-07-07

## 2021-07-02 MED ORDER — ASPIRIN EC 81 MG PO TBEC
81.0000 mg | DELAYED_RELEASE_TABLET | Freq: Every day | ORAL | Status: DC
  Administered 2021-07-03 – 2021-07-07 (×5): 81 mg via ORAL
  Filled 2021-07-02 (×5): qty 1

## 2021-07-02 MED ORDER — ACETAMINOPHEN 325 MG PO TABS: 650.0000 mg | ORAL_TABLET | Freq: Four times a day (QID) | ORAL | Status: DC | PRN

## 2021-07-02 MED ORDER — PROPRANOLOL HCL ER 60 MG PO CP24
60.0000 mg | ORAL_CAPSULE | Freq: Every day | ORAL | Status: DC
  Administered 2021-07-03 – 2021-07-07 (×5): 60 mg via ORAL
  Filled 2021-07-02 (×5): qty 1

## 2021-07-02 MED ORDER — LAMOTRIGINE 25 MG PO TABS
125.0000 mg | ORAL_TABLET | Freq: Two times a day (BID) | ORAL | Status: DC
  Administered 2021-07-02 – 2021-07-07 (×10): 125 mg via ORAL
  Filled 2021-07-02 (×10): qty 1

## 2021-07-02 MED ORDER — SERTRALINE HCL 50 MG PO TABS
100.0000 mg | ORAL_TABLET | Freq: Every day | ORAL | Status: DC
  Administered 2021-07-03 – 2021-07-07 (×5): 100 mg via ORAL
  Filled 2021-07-02 (×5): qty 2

## 2021-07-02 MED ORDER — CLONIDINE HCL 0.1 MG PO TABS
0.1000 mg | ORAL_TABLET | Freq: Two times a day (BID) | ORAL | Status: DC
  Administered 2021-07-02 – 2021-07-07 (×10): 0.1 mg via ORAL
  Filled 2021-07-02 (×10): qty 1

## 2021-07-02 MED ORDER — MAGNESIUM HYDROXIDE 400 MG/5ML PO SUSP: 30.0000 mL | Freq: Every day | ORAL | Status: DC | PRN

## 2021-07-02 NOTE — ED Notes (Signed)
Pt given shower supplies, currently in shower. 

## 2021-07-02 NOTE — ED Notes (Signed)
Pt out of shower at this time 

## 2021-07-02 NOTE — BHH Counselor (Signed)
Patient has been accepted to [facility] bed 27 accepting Dr. Louis Meckel, report given to [facility] Nurse; patient can be accepted after 1330 ? ?TTS ?Sandra Charles ?

## 2021-07-02 NOTE — ED Notes (Signed)
Pt given breakfast tray and drink at this time. 

## 2021-07-02 NOTE — Progress Notes (Signed)
The patient is admitted from the ED [facility] area under IVC. Upn admission she is oriented to self and place but she is not able to state the time, year or her age. She has no insight into why she has been admitted. She denies SI, HI,& AVH. She reports having a history of depression but she is a poor historian. Per report patient was acting bizarre at home. She is currently sitting in the dayroom in a wheelchair due to reports from the patient of right foot pain. Provider was notified of patient's complaint.  ?

## 2021-07-02 NOTE — Plan of Care (Signed)
Patient newly admitted to the unit and she requires stabilization of psychiatric symptoms. ?

## 2021-07-02 NOTE — Group Note (Signed)
BHH LCSW Group Therapy Note ? ? ?Group Date: 07/02/2021 ?Start Time: 1300 ?End Time: 1400 ? ? ?Type of Therapy/Topic:  Group Therapy:  Balance in Life ? ?Participation Level:  Did Not Attend  ? ?Description of Group:   ? This group will address the concept of balance and how it feels and looks when one is unbalanced. Patients will be encouraged to process areas in their lives that are out of balance, and identify reasons for remaining unbalanced. Facilitators will guide patients utilizing problem- solving interventions to address and correct the stressor making their life unbalanced. Understanding and applying boundaries will be explored and addressed for obtaining  and maintaining a balanced life. Patients will be encouraged to explore ways to assertively make their unbalanced needs known to significant others in their lives, using other group members and facilitator for support and feedback. ? ?Therapeutic Goals: ?Patient will identify two or more emotions or situations they have that consume much of in their lives. ?Patient will identify signs/triggers that life has become out of balance:  ?Patient will identify two ways to set boundaries in order to achieve balance in their lives:  ?Patient will demonstrate ability to communicate their needs through discussion and/or role plays ? ?Summary of Patient Progress: Due to limited staffing, group was not held on the unit.  ? ? ? ? ? ?Therapeutic Modalities:   ?Cognitive Behavioral Therapy ?Solution-Focused Therapy ?Assertiveness Training ? ? ?Chiyo Fay K Jozelyn Kuwahara, LCSWA ?

## 2021-07-02 NOTE — ED Notes (Signed)
Pt given lunch tray at this time

## 2021-07-03 DIAGNOSIS — F250 Schizoaffective disorder, bipolar type: Secondary | ICD-10-CM

## 2021-07-03 NOTE — H&P (Signed)
Psychiatric Admission Assessment Adult ? ?Patient Identification: Sandra Charles ?MRN:  [id number] ?Date of Evaluation:  07/03/2021 ?Chief Complaint:  Schizoaffective disorder, bipolar type ([place]) [F25.0] ?Principal Diagnosis: Schizoaffective disorder, bipolar type ([place]) ?Diagnosis:  Principal Problem: ?  Schizoaffective disorder, bipolar type ([place]) ? ?History of Present Illness:  ?Sandra Charles is a 66 year old African-American female who was voluntarily admitted to inpatient psychiatry for more disorganized behavior.  She has a long history of schizoaffective disorder and apparently has not been taking her medications.  She is currently living in [place] with her fianc?Marland Kitchen  Her niece brought her in for being more paranoid and did not think she was taking her medicines.  Sandra Charles does admit to being depressed and feeling suicidal.  She currently denies any auditory hallucinations. ? ?66 yo female with a history of schizoaffective disorder, bipolar type.  Her niece brought her to the hospital after she stopped taking her medications and became paranoid.  She was irritable on assessment, not aggressive.  Refused many of the questions and told this provider, "It's in the chart, read it."   Based on the family concerns and her irritability, admission needed for geriatric psych for stabilization. ? ?Sandra Charles is a 66 y.o. female with a past medical history of HTN, HDL, GERD, depression, chronic pain, anxiety, arthritis and seizure disorder as well as some baseline memory difficulties who presents to the emergency room after she states her niece Sandra Charles told her to come to get checked out for some nausea and vomiting she has had a last couple days.  Patient states she does not feel nauseous at present and is only here because her niece made her come.  She denies any abdominal pain, fevers, chest pain, cough, shortness of breath, urinary symptoms, diarrhea, constipation and states she last had a bowel movement earlier today that was  normal.  After discussion with her niece it seems she has had some difficulty taking her medications but has been noted to eat and drink over the last couple weeks but that seem to be having more difficulty with medications of the last couple days.  ? ?Associated Signs/Symptoms: ?Depression Symptoms:  depressed mood, ?anhedonia, ?Duration of Depression Symptoms: No data recorded ?(Hypo) Manic Symptoms:  Delusions, ?Anxiety Symptoms:   None ?Psychotic Symptoms:  Hallucinations: Auditory ?PTSD Symptoms: ?NA ?Total Time spent with patient: 1 hour ? ?Past Psychiatric History: Reportedly she was diagnosed with schizoaffective disorder bipolar type.  Previous admission at [facility] in 2017.  The patient reports one hospitalization at [facility] several years ago. According to the family she should be taking Seroquel and Celexa. In reviewing her chart and noticed that she has been prescribed Celexa, Xanax, Depakote, and Lamictal from [place] facilities. In addition to Xanax narcotic painkillers also on her list. The patient denies ever attempting suicide. ? ?Is the patient at risk to self? No.  ?Has the patient been a risk to self in the past 6 months? No.  ?Has the patient been a risk to self within the distant past? No.  ?Is the patient a risk to others? No.  ?Has the patient been a risk to others in the past 6 months? No.  ?Has the patient been a risk to others within the distant past? No.  ? ?Prior Inpatient Therapy:   ?Prior Outpatient Therapy:   ? ?Alcohol Screening: Patient refused Alcohol Screening Tool:  (No) ?1. How often do you have a drink containing alcohol?: Never ?2. How many drinks containing alcohol do you have on  a typical day when you are drinking?: 1 or 2 ?3. How often do you have six or more drinks on one occasion?: Never ?AUDIT-C Score: 0 ?4. How often during the last year have you found that you were not able to stop drinking once you had started?: Never ?5. How often during the last year have you  failed to do what was normally expected from you because of drinking?: Never ?6. How often during the last year have you needed a first drink in the morning to get yourself going after a heavy drinking session?: Never ?7. How often during the last year have you had a feeling of guilt of remorse after drinking?: Never ?8. How often during the last year have you been unable to remember what happened the night before because you had been drinking?: Never ?9. Have you or someone else been injured as a result of your drinking?: No ?10. Has a relative or friend or a doctor or another health worker been concerned about your drinking or suggested you cut down?: No ?Alcohol Use Disorder Identification Test Final Score (AUDIT): 0 ?Substance Abuse History in the last 12 months:  Yes.   ?Consequences of Substance Abuse: ?NA ?Previous Psychotropic Medications: Yes  ?Psychological Evaluations: Yes  ?Past Medical History:  ?Past Medical History:  ?Diagnosis Date  ? Anxiety   ? Arthritis   ? "all over"  ? Chronic lower back pain   ? Depression   ? GERD (gastroesophageal reflux disease)   ? Headache   ? "weekly" (07/15/2015)  ? Hyperlipidemia   ? Hypertension   ? Mini stroke   ? "several since 05/2014" (07/15/2015)  ? Seizures ([place]) dx'd 04/2015  ?  ?Past Surgical History:  ?Procedure Laterality Date  ? DILATION AND CURETTAGE OF UTERUS    ? LUMBAR PUNCTURE  07/14/2015  ? ORIF ANKLE FRACTURE Right 01/20/2021  ? Procedure: OPEN REDUCTION INTERNAL FIXATION (ORIF) ANKLE FRACTURE;  Surgeon: Corky Mull, MD;  Location: [facility] ORS;  Service: Orthopedics;  Laterality: Right;  ? TUBAL LIGATION    ? VAGINAL HYSTERECTOMY    ? ?Family History:  ?Family History  ?Problem Relation Age of Onset  ? Hyperlipidemia Mother   ? Breast cancer Mother 16  ? Hypertension Mother   ? Heart attack Mother   ?     age 102's  ? Hyperlipidemia Father   ? Hypertension Father   ? Heart attack Father 42  ? Breast cancer Sister 40  ? ?Family Psychiatric  History: The  patient reports that her mother struggled with depression. ?Tobacco Screening:   ?Social History:  ?Social History  ? ?Substance and Sexual Activity  ?Alcohol Use Not Currently  ? Comment: 07/15/2015 "nothing in the last few years"  ?   ?Social History  ? ?Substance and Sexual Activity  ?Drug Use No  ?  ?Additional Social History: ?  ?  She lives with her fianc?Marland Kitchen She apparently has not been employed for at least a year but at times believes that she is still working. She has Medicaid. Her son and her niece live close by and are supportive. ?  ?  ?  ?  ?  ?  ?  ?  ?  ?  ? ?Allergies:   ?Allergies  ?Allergen Reactions  ? Lovastatin Other (See Comments)  ?  Unknown reaction  ? ?Lab Results:  ?Results for orders placed or performed during the hospital encounter of 07/01/21 (from the past 48 hour(s))  ?Comprehensive metabolic panel  Status: Abnormal  ? Collection Time: 07/01/21  3:43 PM  ?Result Value Ref Range  ? Sodium 137 135 - 145 mmol/L  ? Potassium 3.4 (L) 3.5 - 5.1 mmol/L  ? Chloride 97 (L) 98 - 111 mmol/L  ? CO2 25 22 - 32 mmol/L  ? Glucose, Bld 94 70 - 99 mg/dL  ?  Comment: Glucose reference range applies only to samples taken after fasting for at least 8 hours.  ? BUN 25 (H) 8 - 23 mg/dL  ? Creatinine, Ser 0.92 0.44 - 1.00 mg/dL  ? Calcium 10.2 8.9 - 10.3 mg/dL  ? Total Protein 8.8 (H) 6.5 - 8.1 g/dL  ? Albumin 4.6 3.5 - 5.0 g/dL  ? AST 42 (H) 15 - 41 U/L  ? ALT 41 0 - 44 U/L  ? Alkaline Phosphatase 96 38 - 126 U/L  ? Total Bilirubin 1.0 0.3 - 1.2 mg/dL  ? GFR, Estimated >60 >60 mL/min  ?  Comment: (NOTE) ?Calculated using the CKD-EPI Creatinine Equation (2021) ?  ? Anion gap 15 5 - 15  ?  Comment: Performed at [facility], [address], [place], [place] [address]  ?Ethanol     Status: None  ? Collection Time: 07/01/21  3:43 PM  ?Result Value Ref Range  ? Alcohol, Ethyl (B) <10 <10 mg/dL  ?  Comment: (NOTE) ?Lowest detectable limit for serum alcohol is 10 mg/dL. ? ?For medical purposes  only. ?Performed at [facility], [place], ?[place] [address] ?  ?Salicylate level     Status: Abnormal  ? Collection Time: 07/01/21  3:43 PM  ?Result Value Ref Range  ? Salicylate Lvl <6.5 (L) 7

## 2021-07-03 NOTE — Progress Notes (Signed)
Patient requires encouragement to come out of room for meals. Patient cooperative but at times irritable/suspicious. Patient randomly cursed at [facility] during dinner. Patient redirected.  ?

## 2021-07-03 NOTE — BHH Suicide Risk Assessment (Signed)
[facility] Admission Suicide Risk Assessment ? ? ?Nursing information obtained from:    ?Demographic factors:  Low socioeconomic status ?Current Mental Status:  NA ?Loss Factors:  NA ?Historical Factors:  NA ?Risk Reduction Factors:  NA ? ?Total Time spent with patient: 1 hour ?Principal Problem: Schizoaffective disorder, bipolar type ([place]) ?Diagnosis:  Principal Problem: ?  Schizoaffective disorder, bipolar type ([place]) ? ?Subjective Data: 66 yo female with a history of schizoaffective disorder, bipolar type.  Her niece brought her to the hospital after she stopped taking her medications and became paranoid.  She was irritable on assessment, not aggressive.  Refused many of the questions and told this provider, "It's in the chart, read it."   Based on the family concerns and her irritability, admission needed for geriatric psych for stabilization. ? ?Continued Clinical Symptoms:  ?Alcohol Use Disorder Identification Test Final Score (AUDIT): 0 ?The "Alcohol Use Disorders Identification Test", Guidelines for Use in Primary Care, Second Edition.  World Pharmacologist [facility]). ?Score between 0-7:  no or low risk or alcohol related problems. ?Score between 8-15:  moderate risk of alcohol related problems. ?Score between 16-19:  high risk of alcohol related problems. ?Score 20 or above:  warrants further diagnostic evaluation for alcohol dependence and treatment. ? ? ?CLINICAL FACTORS:  ? Previous Psychiatric Diagnoses and Treatments ? ? ?Musculoskeletal: ?Strength & Muscle Tone: within normal limits ?Gait & Station: normal ?Patient leans: N/A ? ?Psychiatric Specialty Exam: ? ?Presentation  ?General Appearance: Appropriate for Environment ? ?Eye Contact:Good ? ?Speech:Clear and Coherent ? ?Speech Volume:Normal ? ?Handedness:Right ? ? ?Mood and Affect  ?Mood:Euthymic ? ?Affect:Appropriate ? ? ?Thought Process  ?Thought Processes:Coherent ? ?Descriptions of Associations:Intact ? ?Orientation:Full (Time, Place and  Person) ? ?Thought Content:WDL ? ?History of Schizophrenia/Schizoaffective disorder:Yes ? ?Duration of Psychotic Symptoms:Greater than six months ? ?Hallucinations:No data recorded ?Ideas of Reference:None ? ?Suicidal Thoughts:No data recorded ?Homicidal Thoughts:No data recorded ? ?Sensorium  ?Memory:Immediate Fair ? ?Judgment:Good ? ?Insight:Fair ? ? ?Executive Functions  ?Concentration:Fair ? ?Attention Span:Good ? ?Recall:Fair ? ?[place] ? ?Language:Fair ? ? ?Psychomotor Activity  ?Psychomotor Activity:No data recorded ? ?Assets  ?Assets:Communication Skills ? ? ?Sleep  ?Sleep:No data recorded ? ? ? ?Blood pressure (!) 151/70, pulse 71, height '5\' 1"'$  (1.549 m), weight 79.8 kg, SpO2 98 %. Body mass index is 33.25 kg/m?. ? ? ?COGNITIVE FEATURES THAT CONTRIBUTE TO RISK:  ?None   ? ?SUICIDE RISK:  ? Minimal: No identifiable suicidal ideation.  Patients presenting with no risk factors but with morbid ruminations; may be classified as minimal risk based on the severity of the depressive symptoms ? ?PLAN OF CARE: See orders ? ?I certify that inpatient services furnished can reasonably be expected to improve the patient's condition.  ? ?Parks Ranger, DO ?07/03/2021, 10:23 AM ? ?

## 2021-07-03 NOTE — BH IP Treatment Plan (Signed)
Interdisciplinary Treatment and Diagnostic Plan Update ? ?07/03/2021 ?Time of Session: 0930 ?Sandra Charles ?MRN: [id number] ? ?Principal Diagnosis: Schizoaffective disorder, bipolar type ([place]) ? ?Secondary Diagnoses: Principal Problem: ?  Schizoaffective disorder, bipolar type ([place]) ? ? ?Current Medications:  ?Current Facility-Administered Medications  ?Medication Dose Route Frequency Provider Last Rate Last Admin  ? acetaminophen (TYLENOL) tablet 650 mg  650 mg Oral Q6H PRN Patrecia Pour, NP      ? alum & mag hydroxide-simeth (MAALOX/MYLANTA) 200-200-20 MG/5ML suspension 30 mL  30 mL Oral Q4H PRN Patrecia Pour, NP      ? amLODipine (NORVASC) tablet 10 mg  10 mg Oral Daily Patrecia Pour, NP   10 mg at 07/03/21 4098  ? aspirin EC tablet 81 mg  81 mg Oral Daily Patrecia Pour, NP   81 mg at 07/03/21 1191  ? cloBAZam (ONFI) tablet 5 mg  5 mg Oral QHS Patrecia Pour, NP   5 mg at 07/02/21 2131  ? cloNIDine (CATAPRES) tablet 0.1 mg  0.1 mg Oral BID Patrecia Pour, NP   0.1 mg at 07/03/21 4782  ? divalproex (DEPAKOTE) DR tablet 250 mg  250 mg Oral Q12H Patrecia Pour, NP   250 mg at 07/03/21 9562  ? donepezil (ARICEPT) tablet 10 mg  10 mg Oral QHS Patrecia Pour, NP   10 mg at 07/02/21 2131  ? ibuprofen (ADVIL) tablet 800 mg  800 mg Oral Q6H PRN Patrecia Pour, NP   800 mg at 07/02/21 2132  ? lamoTRIgine (LAMICTAL) tablet 125 mg  125 mg Oral BID Patrecia Pour, NP   125 mg at 07/03/21 1308  ? magnesium hydroxide (MILK OF MAGNESIA) suspension 30 mL  30 mL Oral Daily PRN Patrecia Pour, NP      ? pantoprazole (PROTONIX) EC tablet 40 mg  40 mg Oral Daily Patrecia Pour, NP   40 mg at 07/03/21 6578  ? propranolol ER (INDERAL LA) 24 hr capsule 60 mg  60 mg Oral Daily Patrecia Pour, NP   60 mg at 07/03/21 4696  ? QUEtiapine (SEROQUEL XR) 24 hr tablet 50 mg  50 mg Oral QHS Patrecia Pour, NP   50 mg at 07/02/21 2132  ? rosuvastatin (CRESTOR) tablet 20 mg  20 mg Oral QHS Patrecia Pour, NP   20 mg at 07/02/21 2131   ? sertraline (ZOLOFT) tablet 100 mg  100 mg Oral Daily Patrecia Pour, NP   100 mg at 07/03/21 2952  ? vitamin B-12 (CYANOCOBALAMIN) tablet 1,000 mcg  1,000 mcg Oral Daily Patrecia Pour, NP   1,000 mcg at 07/03/21 8413  ? ?PTA Medications: ?Medications Prior to Admission  ?Medication Sig Dispense Refill Last Dose  ? amLODipine (NORVASC) 10 MG tablet Take 10 mg by mouth daily. For high blood pressure     ? aspirin EC 325 MG tablet Take 1 tablet (325 mg total) by mouth daily. 30 tablet 0   ? aspirin EC 81 MG tablet Take 81 mg by mouth daily. Swallow whole.     ? cloBAZam (ONFI) 10 MG tablet Take 0.5 tablets (5 mg total) by mouth at bedtime. 30 tablet 1   ? cloNIDine (CATAPRES) 0.1 MG tablet Take 1 tablet (0.1 mg total) by mouth 2 (two) times daily. 60 tablet 11   ? donepezil (ARICEPT) 10 MG tablet Take 10 mg by mouth at bedtime.     ? ibuprofen (ADVIL) 800 MG  tablet Take 1 tablet by mouth every 6 (six) hours as needed.     ? lamoTRIgine (LAMICTAL) 100 MG tablet 100 mg in morning and 125 mg in evening. 60 tablet 0   ? lamoTRIgine (LAMICTAL) 25 MG tablet Take 1 tablet (25 mg total) by mouth every evening. Along with 100 mg tablet for total of 125 mg in evening. (Patient taking differently: Take 125 mg by mouth 2 (two) times daily. Along with 100 mg tablet for total of 125 mg in evening.) 60 tablet 0   ? OZEMPIC, 0.25 OR 0.5 MG/DOSE, 2 MG/1.5ML SOPN Inject 0.25 mg into the skin once a week. (Patient not taking: Reported on 05/01/2021)     ? OZEMPIC, 1 MG/DOSE, 4 MG/3ML SOPN Inject into the skin. Pt taking on fridays     ? pantoprazole (PROTONIX) 40 MG tablet Take 1 tablet (40 mg total) by mouth daily. 30 tablet 11   ? propranolol ER (INDERAL LA) 60 MG 24 hr capsule Take 60 mg by mouth daily.  11   ? QUEtiapine (SEROQUEL XR) 50 MG TB24 24 hr tablet Take 50 mg by mouth at bedtime.     ? rosuvastatin (CRESTOR) 20 MG tablet Take 20 mg by mouth at bedtime.     ? sertraline (ZOLOFT) 100 MG tablet Take 100 mg by mouth daily.   2   ? traZODone (DESYREL) 100 MG tablet Take 1 tablet (100 mg total) by mouth at bedtime as needed for sleep. 30 tablet 0   ? vitamin B-12 (CYANOCOBALAMIN) 1000 MCG tablet Take 1,000 mcg by mouth daily.     ? ? ?Patient Stressors:   ? ?Patient Strengths:   ? ?Treatment Modalities: Medication Management, Group therapy, Case management,  ?1 to 1 session with clinician, Psychoeducation, Recreational therapy. ? ? ?Physician Treatment Plan for Primary Diagnosis: Schizoaffective disorder, bipolar type ([place]) ?Long Term Goal(s):    ? ?Short Term Goals:   ? ?Medication Management: Evaluate patient's response, side effects, and tolerance of medication regimen. ? ?Therapeutic Interventions: 1 to 1 sessions, Unit Group sessions and Medication administration. ? ?Evaluation of Outcomes: Not Met ? ?Physician Treatment Plan for Secondary Diagnosis: Principal Problem: ?  Schizoaffective disorder, bipolar type ([place]) ? ?Long Term Goal(s):    ? ?Short Term Goals:      ? ?Medication Management: Evaluate patient's response, side effects, and tolerance of medication regimen. ? ?Therapeutic Interventions: 1 to 1 sessions, Unit Group sessions and Medication administration. ? ?Evaluation of Outcomes: Not Met ? ? ?RN Treatment Plan for Primary Diagnosis: Schizoaffective disorder, bipolar type ([place]) ?Long Term Goal(s): Knowledge of disease and therapeutic regimen to maintain health will improve ? ?Short Term Goals: Ability to remain free from injury will improve, Ability to verbalize frustration and anger appropriately will improve, Ability to demonstrate self-control, Ability to participate in decision making will improve, Ability to verbalize feelings will improve, Ability to disclose and discuss suicidal ideas, Ability to identify and develop effective coping behaviors will improve, and Compliance with prescribed medications will improve ? ?Medication Management: RN will administer medications as ordered by provider, will assess and  evaluate patient's response and provide education to patient for prescribed medication. RN will report any adverse and/or side effects to prescribing provider. ? ?Therapeutic Interventions: 1 on 1 counseling sessions, Psychoeducation, Medication administration, Evaluate responses to treatment, Monitor vital signs and CBGs as ordered, Perform/monitor CIWA, COWS, AIMS and Fall Risk screenings as ordered, Perform wound care treatments as ordered. ? ?Evaluation of Outcomes: Not Met ? ? ?  LCSW Treatment Plan for Primary Diagnosis: Schizoaffective disorder, bipolar type ([place]) ?Long Term Goal(s): Safe transition to appropriate next level of care at discharge, Engage patient in therapeutic group addressing interpersonal concerns. ? ?Short Term Goals: Engage patient in aftercare planning with referrals and resources, Increase social support, Increase ability to appropriately verbalize feelings, Increase emotional regulation, Facilitate acceptance of mental health diagnosis and concerns, Facilitate patient progression through stages of change regarding substance use diagnoses and concerns, Identify triggers associated with mental health/substance abuse issues, and Increase skills for wellness and recovery ? ?Therapeutic Interventions: Assess for all discharge needs, 1 to 1 time with Education officer, museum, Explore available resources and support systems, Assess for adequacy in community support network, Educate family and significant other(s) on suicide prevention, Complete Psychosocial Assessment, Interpersonal group therapy. ? ?Evaluation of Outcomes: Not Met ? ? ?Progress in Treatment: ?Attending groups: No. ?Participating in groups: No. ?Taking medication as prescribed: Yes. ?Toleration medication: Yes. ?Family/Significant other contact made: No, will contact:  CSW will obtain consent to reach collateral contact  ?Patient understands diagnosis: No. ?Discussing patient identified problems/goals with staff: Yes. ?Medical problems  stabilized or resolved: Yes. ?Denies suicidal/homicidal ideation: No. ?Issues/concerns per patient self-inventory: Yes. ?Other: none  ? ?New problem(s) identified: No, Describe:  No additional problems/concern

## 2021-07-03 NOTE — Progress Notes (Signed)
Patient mostly isolative to room but alert. Able to ambulate with front wheel walker. Patient denied pain. Patient denies SI, HI, and A/V/H with no plan/intent. Patient med compliant this morning and cooperative. Patient stated she was mostly tired and wanted to rest. Minimal interaction.  ? ? ? 07/03/21 0915  ?Psych Admission Type (Psych Patients Only)  ?Admission Status Involuntary  ?Psychosocial Assessment  ?Patient Complaints Sleep disturbance  ?Eye Contact Brief  ?Facial Expression Flat  ?Affect Flat  ?Speech Logical/coherent;Soft  ?Interaction Minimal  ?Motor Activity Slow  ?Appearance/Hygiene In scrubs  ?Behavior Characteristics Cooperative;Guarded  ?Mood Depressed  ?Thought Process  ?Coherency Circumstantial  ?Content WDL  ?Delusions None reported or observed  ?Perception WDL  ?Hallucination None reported or observed  ?Judgment Poor  ?Confusion Mild  ?Danger to Self  ?Current suicidal ideation? Denies  ?Agreement Not to Harm Self Yes  ?Description of Agreement verbal  ?Danger to Others  ?Danger to Others None reported or observed  ? ? ?

## 2021-07-03 NOTE — Progress Notes (Signed)
Patient ambulating with front wheel walker, all fall protocol in place. Oriented to self reoriented to time and place. Stated that she feels like she is  in prison she is not suppose to be here. Pt  required a lot of encouragement to take medication. No adverse reaction noted. ? ?Support and encouragement provided.  ?

## 2021-07-03 NOTE — Plan of Care (Signed)
?  Problem: Education: ?Goal: Knowledge of the prescribed therapeutic regimen will improve ?Outcome: Progressing ?  ?Problem: Health Behavior/Discharge Planning: ?Goal: Compliance with therapeutic regimen will improve ?Outcome: Progressing ?  ?Problem: Safety: ?Goal: Ability to disclose and discuss suicidal ideas will improve ?Outcome: Progressing ?  ?

## 2021-07-03 NOTE — Progress Notes (Signed)
Recreation Therapy Notes ? ?Date: 07/03/2021 ?  ?Time: 1:40 pm ?  ?Location: Court yard  ?  ?Behavioral response: N/A ?  ?Intervention Topic: Emotions  ?  ?Discussion/Intervention: ?Patient refused to attend group.  ?  ?Clinical Observations/Feedback:  ?Patient refused to attend group.  ?  ?Tab [name] ? ? ? ? ? ? ? ?Sandra Charles ?07/03/2021 3:10 PM ?

## 2021-07-03 NOTE — Progress Notes (Addendum)
[facility] LCSW Progress Note ?  ?07/03/2021    2:26 PM ? ?Sandra Charles  ? ?[id number]  ? ?Type of Contact and Topic: PSA Attempt   ? ?CSW attempted to completed PSA with patient. During this attempt, simply repeated herself "I do not want to sign." Patient was not being asked to sign anything. Patient is currently unable to engage in productive conversation. CSW will make additional attempts to complete PSA.  ? ?CSW received call from Glenna Fellows, niece, who requests an update on patient. Niece was informed the patient has not yet provided consent for writer to share health information. CSW informed her he would call her back once consent has been obtained. CSW later attempted to get consent from patient, however, she declined to provide consent at this time.  ? ?CSW will not reach niece until consent has been obtained. Nursing has been update on situation.  ?  ?Signed:  ?Durenda Hurt, MSW, LCSWA, LCAS ?07/03/2021 2:26 PM ?  ?  ?

## 2021-07-04 DIAGNOSIS — F250 Schizoaffective disorder, bipolar type: Secondary | ICD-10-CM | POA: Diagnosis not present

## 2021-07-04 NOTE — Progress Notes (Signed)
[facility] MD Progress Note ? ?07/04/2021 10:41 AM ?Sandra Charles  ?MRN:  [id number] ?Subjective: Sandra Charles is seen on rounds.  She is complaining of feeling dizzy even though her blood pressure is within normal limits.  She is taking her medications as prescribed and denies any side effects.  There is no evidence of EPS or TD.  She gave me a verbal that we can talk to her family.  Her family has been calling but social work does not have anything signed on paper.  She has intermittent suicidal ideation. ? ?Principal Problem: Schizoaffective disorder, bipolar type ([place]) ?Diagnosis: Principal Problem: ?  Schizoaffective disorder, bipolar type ([place]) ? ?Total Time spent with patient: 15 minutes ? ?Past Psychiatric History:  Reportedly she was diagnosed with schizoaffective disorder bipolar type.  Previous admission at [facility] in 2017.  The patient reports one hospitalization at [facility] several years ago. According to the family she should be taking Seroquel and Celexa. In reviewing her chart and noticed that she has been prescribed Celexa, Xanax, Depakote, and Lamictal from [place] facilities. In addition to Xanax narcotic painkillers also on her list. The patient denies ever attempting suicide. ? ?Past Medical History:  ?Past Medical History:  ?Diagnosis Date  ? Anxiety   ? Arthritis   ? "all over"  ? Chronic lower back pain   ? Depression   ? GERD (gastroesophageal reflux disease)   ? Headache   ? "weekly" (07/15/2015)  ? Hyperlipidemia   ? Hypertension   ? Mini stroke   ? "several since 05/2014" (07/15/2015)  ? Seizures ([place]) dx'd 04/2015  ?  ?Past Surgical History:  ?Procedure Laterality Date  ? DILATION AND CURETTAGE OF UTERUS    ? LUMBAR PUNCTURE  07/14/2015  ? ORIF ANKLE FRACTURE Right 01/20/2021  ? Procedure: OPEN REDUCTION INTERNAL FIXATION (ORIF) ANKLE FRACTURE;  Surgeon: Corky Mull, MD;  Location: [facility] ORS;  Service: Orthopedics;  Laterality: Right;  ? TUBAL LIGATION    ? VAGINAL HYSTERECTOMY    ? ?Family History:  ?Family  History  ?Problem Relation Age of Onset  ? Hyperlipidemia Mother   ? Breast cancer Mother 19  ? Hypertension Mother   ? Heart attack Mother   ?     age 44's  ? Hyperlipidemia Father   ? Hypertension Father   ? Heart attack Father 20  ? Breast cancer Sister 34  ? ? ?Social History:  ?Social History  ? ?Substance and Sexual Activity  ?Alcohol Use Not Currently  ? Comment: 07/15/2015 "nothing in the last few years"  ?   ?Social History  ? ?Substance and Sexual Activity  ?Drug Use No  ?  ?Social History  ? ?Socioeconomic History  ? Marital status: Single  ?  Spouse name: Not on file  ? Number of children: 1  ? Years of education: Not on file  ? Highest education level: Not on file  ?Occupational History  ? Occupation: disabled  ?Tobacco Use  ? Smoking status: Former  ?  Packs/day: 1.00  ?  Years: 5.00  ?  Pack years: 5.00  ?  Types: Cigarettes  ? Smokeless tobacco: Never  ? Tobacco comments:  ?  "quit smoking cigarettes in the early 2000s"  ?Substance and Sexual Activity  ? Alcohol use: Not Currently  ?  Comment: 07/15/2015 "nothing in the last few years"  ? Drug use: No  ? Sexual activity: Not Currently  ?Other Topics Concern  ? Not on file  ?Social History Narrative  ?  Lives with significant other, has son who comes by daily.  ? ?Social Determinants of Health  ? ?Financial Resource Strain: Not on file  ?Food Insecurity: Not on file  ?Transportation Needs: Not on file  ?Physical Activity: Not on file  ?Stress: Not on file  ?Social Connections: Not on file  ? ?Additional Social History:  ?  ?  ?  ?  ?  ?  ?  ?  ?  ?  ?  ? ?Sleep: Good ? ?Appetite:  Good ? ?Current Medications: ?Current Facility-Administered Medications  ?Medication Dose Route Frequency Provider Last Rate Last Admin  ? acetaminophen (TYLENOL) tablet 650 mg  650 mg Oral Q6H PRN Patrecia Pour, NP      ? alum & mag hydroxide-simeth (MAALOX/MYLANTA) 200-200-20 MG/5ML suspension 30 mL  30 mL Oral Q4H PRN Patrecia Pour, NP      ? amLODipine (NORVASC) tablet  10 mg  10 mg Oral Daily Patrecia Pour, NP   10 mg at 07/04/21 1610  ? aspirin EC tablet 81 mg  81 mg Oral Daily Patrecia Pour, NP   81 mg at 07/04/21 9604  ? cloBAZam (ONFI) tablet 5 mg  5 mg Oral QHS Patrecia Pour, NP   5 mg at 07/03/21 2128  ? cloNIDine (CATAPRES) tablet 0.1 mg  0.1 mg Oral BID Patrecia Pour, NP   0.1 mg at 07/04/21 5409  ? divalproex (DEPAKOTE) DR tablet 250 mg  250 mg Oral Q12H Patrecia Pour, NP   250 mg at 07/04/21 8119  ? donepezil (ARICEPT) tablet 10 mg  10 mg Oral QHS Patrecia Pour, NP   10 mg at 07/03/21 2127  ? ibuprofen (ADVIL) tablet 800 mg  800 mg Oral Q6H PRN Patrecia Pour, NP   800 mg at 07/02/21 2132  ? lamoTRIgine (LAMICTAL) tablet 125 mg  125 mg Oral BID Patrecia Pour, NP   125 mg at 07/04/21 1478  ? magnesium hydroxide (MILK OF MAGNESIA) suspension 30 mL  30 mL Oral Daily PRN Patrecia Pour, NP      ? pantoprazole (PROTONIX) EC tablet 40 mg  40 mg Oral Daily Patrecia Pour, NP   40 mg at 07/04/21 2956  ? propranolol ER (INDERAL LA) 24 hr capsule 60 mg  60 mg Oral Daily Patrecia Pour, NP   60 mg at 07/04/21 2130  ? QUEtiapine (SEROQUEL XR) 24 hr tablet 50 mg  50 mg Oral QHS Patrecia Pour, NP   50 mg at 07/03/21 2128  ? rosuvastatin (CRESTOR) tablet 20 mg  20 mg Oral QHS Patrecia Pour, NP   20 mg at 07/03/21 2129  ? sertraline (ZOLOFT) tablet 100 mg  100 mg Oral Daily Patrecia Pour, NP   100 mg at 07/04/21 8657  ? vitamin B-12 (CYANOCOBALAMIN) tablet 1,000 mcg  1,000 mcg Oral Daily Patrecia Pour, NP   1,000 mcg at 07/04/21 8469  ? ? ?Lab Results: No results found for this or any previous visit (from the past 48 hour(s)). ? ?Blood Alcohol level:  ?Lab Results  ?Component Value Date  ? ETH <10 07/01/2021  ? ETH <10 01/29/2021  ? ? ?Metabolic Disorder Labs: ?Lab Results  ?Component Value Date  ? HGBA1C 6.1 (H) 08/10/2015  ? ?Lab Results  ?Component Value Date  ? PROLACTIN 9.4 08/10/2015  ? PROLACTIN 7.1 07/12/2015  ? ?Lab Results  ?Component Value Date  ?  CHOL 108 08/10/2015  ?  TRIG 92 08/10/2015  ? HDL 42 08/10/2015  ? CHOLHDL 2.6 08/10/2015  ? VLDL 18 08/10/2015  ? [place] 48 08/10/2015  ? ? ?Physical Findings: ?AIMS:  , ,  ,  ,    ?CIWA:    ?COWS:    ? ?Musculoskeletal: ?Strength & Muscle Tone: within normal limits ?Gait & Station: normal ?Patient leans: N/A ? ?Psychiatric Specialty Exam: ? ?Presentation  ?General Appearance: Appropriate for Environment ? ?Eye Contact:Good ? ?Speech:Clear and Coherent ? ?Speech Volume:Normal ? ?Handedness:Right ? ? ?Mood and Affect  ?Mood:Euthymic ? ?Affect:Appropriate ? ? ?Thought Process  ?Thought Processes:Coherent ? ?Descriptions of Associations:Intact ? ?Orientation:Full (Time, Place and Person) ? ?Thought Content:WDL ? ?History of Schizophrenia/Schizoaffective disorder:Yes ? ?Duration of Psychotic Symptoms:Greater than six months ? ?Hallucinations:No data recorded ?Ideas of Reference:None ? ?Suicidal Thoughts:No data recorded ?Homicidal Thoughts:No data recorded ? ?Sensorium  ?Memory:Immediate Fair ? ?Judgment:Good ? ?Insight:Fair ? ? ?Executive Functions  ?Concentration:Fair ? ?Attention Span:Good ? ?Recall:Fair ? ?[place] ? ?Language:Fair ? ? ?Psychomotor Activity  ?Psychomotor Activity:No data recorded ? ?Assets  ?Assets:Communication Skills ? ? ?Sleep  ?Sleep:No data recorded ? ? ?Physical Exam: ?Physical Exam ?Vitals and nursing note reviewed.  ?Constitutional:   ?   Appearance: Normal appearance. She is normal weight.  ?Neurological:  ?   General: No focal deficit present.  ?   Mental Status: She is alert and oriented to person, place, and time.  ?Psychiatric:     ?   Attention and Perception: Attention and perception normal.     ?   Mood and Affect: Mood is depressed. Affect is flat.     ?   Speech: Speech normal.     ?   Behavior: Behavior normal. Behavior is cooperative.     ?   Thought Content: Thought content is paranoid.     ?   Cognition and Memory: Cognition and memory normal.     ?   Judgment:  Judgment normal.  ? ?Review of Systems  ?Constitutional: Negative.   ?HENT: Negative.    ?Eyes: Negative.   ?Respiratory: Negative.    ?Cardiovascular: Negative.   ?Gastrointestinal: Negative.   ?[place]

## 2021-07-04 NOTE — Progress Notes (Addendum)
[facility] LCSW Progress Note ?  ?07/04/2021    1:59 PM ? ?Sandra Charles  ? ?[id number]  ? ?Type of Contact and Topic:  PSA Attempt  ? ?CSW attempted to complete PSA with patient at bedside. Patient has been withdrawn and has stayed in her room for most of the day. Patient was asleep and unable to arouse at this time. Chest rise and fall observed, no signs of acute distress. CSW team will make final attempt to complete PSA with patient prior to completing PSA via chart review.  ? ?Per conversation with patient earlier in the day, patient continues to decline written consent for CSW to reach collateral.   ? ?Situation ongoing, CSW will continue to monitor and update note as more information becomes available.  ? ?  ?Signed:  ?Durenda Hurt, MSW, LCSWA, LCAS ?07/04/2021 1:59 PM ?  ?  ?

## 2021-07-04 NOTE — Plan of Care (Signed)
?  Problem: Coping: ?Goal: Coping ability will improve ?Outcome: Progressing  ?Problem: Health Behavior/Discharge Planning: ?Goal: Compliance with therapeutic regimen will improve ?Outcome: Progressing ?  ?

## 2021-07-04 NOTE — Plan of Care (Signed)
Patient is alert and oriented times 2. Reports she feels ok this morning. Mood and affect flat. Patient c/o being dizzy this am- MD notified, Bp 115/59-patient now in bed resting. Patient denies pain at this time. She denies SI, HI, and AVH. Also denies feelings of anxiety and depression at this time. States she slept good last night. Morning meds given whole by mouth W/O difficulty. Ate breakfast in day room- appetite good. Patient remains on unit with Q15 minute checks in place.  ? ? ? ?Problem: Education: ?Goal: Utilization of techniques to improve thought processes will improve ?Outcome: Progressing ?Goal: Knowledge of the prescribed therapeutic regimen will improve ?Outcome: Progressing ?  ?Problem: Activity: ?Goal: Interest or engagement in leisure activities will improve ?Outcome: Progressing ?Goal: Imbalance in normal sleep/wake cycle will improve ?Outcome: Progressing ?  ?Problem: Coping: ?Goal: Coping ability will improve ?Outcome: Progressing ?Goal: Will verbalize feelings ?Outcome: Progressing ?  ?Problem: Health Behavior/Discharge Planning: ?Goal: Ability to make decisions will improve ?Outcome: Progressing ?Goal: Compliance with therapeutic regimen will improve ?Outcome: Progressing ?  ?Problem: Role Relationship: ?Goal: Will demonstrate positive changes in social behaviors and relationships ?Outcome: Progressing ?  ?Problem: Safety: ?Goal: Ability to disclose and discuss suicidal ideas will improve ?Outcome: Progressing ?Goal: Ability to identify and utilize support systems that promote safety will improve ?Outcome: Progressing ?  ?Problem: Self-Concept: ?Goal: Will verbalize positive feelings about self ?Outcome: Progressing ?Goal: Level of anxiety will decrease ?Outcome: Progressing ?  ?

## 2021-07-04 NOTE — Progress Notes (Signed)
Pt self isolative in her room and sleeping all shift except when woken up for meds. Pt declined snacks. Pt cooperative with her medications. ?Pt ambulates with her front wheel walker in hallway and room. Pt denies SI/HI/AVH. Pt reports that she is  "just tired". Pt engages in minimal interaction even with encouragement. Pt agreed to come out of her room for her medications. Pt returned back to her bed afterwards. Pt did not engage with peers or other staff members. Emotional support given. Q15 min safety checks maintained.  ?

## 2021-07-04 NOTE — Group Note (Signed)
LCSW Group Therapy Note ? ?Group Date: 07/04/2021 ?Start Time: 1500 ?End Time: 1600 ? ? ?Type of Therapy and Topic:  Group Therapy - How To Cope with Nervousness about Discharge  ? ?Participation Level:  Did Not Attend  ? ?Description of Group ?This process group involved identification of patients' feelings about discharge. Some of them are scheduled to be discharged soon, while others are new admissions, but each of them was asked to share thoughts and feelings surrounding discharge from the hospital. One common theme was that they are excited at the prospect of going home, while another was that many of them are apprehensive about sharing why they were hospitalized. Patients were given the opportunity to discuss these feelings with their peers in preparation for discharge. ? ?Therapeutic Goals ? ?Patient will identify their overall feelings about pending discharge. ?Patient will think about how they might proactively address issues that they believe will once again arise once they get home (i.e. with parents). ?Patients will participate in discussion about having hope for change. ? ? ?Summary of Patient Progress:   ? ?Patient did not attend group despite encouraged participation.  ? ?Therapeutic Modalities ?Cognitive Behavioral Therapy ? ? ?Durenda Hurt, LCSWA ?07/04/2021  4:14 PM   ?

## 2021-07-04 NOTE — Progress Notes (Addendum)
Recreation Therapy Notes ? ?Date: 07/04/2021 ?  ?Time: 1:25 pm ?  ?Location: Craft room ?  ?Behavioral response: N/A ?  ?Intervention Topic: Wellness  ?  ?Discussion/Intervention: ?Patient refused to attend group.  ?  ?Clinical Observations/Feedback:  ?Patient refused to attend group.  ?  ?Sandra Charles LRT/CTRS ? ?  ? ? ? ? ? ? ? ?Sandra Charles ?07/04/2021 2:19 PM ?

## 2021-07-05 DIAGNOSIS — F250 Schizoaffective disorder, bipolar type: Secondary | ICD-10-CM | POA: Diagnosis not present

## 2021-07-05 LAB — LIPID PANEL
Cholesterol: 190 mg/dL (ref 0–200)
HDL: 49 mg/dL (ref >40)
LDL Cholesterol: 121 mg/dL — ABNORMAL HIGH (ref 0–99)
Total CHOL/HDL Ratio: 3.9 RATIO
Triglycerides: 100 mg/dL (ref <150)
VLDL: 20 mg/dL (ref 0–40)

## 2021-07-05 LAB — HEMOGLOBIN A1C
Hgb A1c MFr Bld: 5.6 % (ref 4.8–5.6)
Mean Plasma Glucose: 114.02 mg/dL

## 2021-07-05 NOTE — Progress Notes (Signed)
Recreation Therapy Notes ? ?INPATIENT RECREATION THERAPY ASSESSMENT ? ?Patient Details ?Name: NEYTIRI ASCHE ?MRN: [id number] ?DOB: December 22, 1955 ?Today's Date: 07/05/2021 ?      ?Information Obtained From: ?Patient (Patient would not wake up for assessment) ? ?Able to Participate in Assessment/Interview: ?  ? ?Patient Presentation: ?  ? ?Reason for Admission (Per Patient): ?  ? ?Patient Stressors: ?  ? ?Coping Skills:   ?  ? ?Leisure Interests (2+):  ?  ? ?Frequency of Recreation/Participation: ?  ? ?Awareness of Community Resources:  ?  ? ?Community Resources:  ?  ? ?Current Use: ?  ? ?If no, Barriers?: ?  ? ?Expressed Interest in [employer] Information: ?  ? ?[place] of Residence:  ?  ? ?Patient Main Form of Transportation: ?  ? ?Patient Strengths:  ?  ? ?Patient Identified Areas of Improvement:  ?  ? ?Patient Goal for Hospitalization:  ?  ? ?Current SI (including self-harm):  ?  ? ?Current HI:  ?  ? ?Current AVH: ?  ? ?Staff Intervention Plan: ?  ? ?Consent to Intern Participation: ?  ? ?Kratos Ruscitti ?07/05/2021, 3:25 PM ?

## 2021-07-05 NOTE — Progress Notes (Signed)
Pt standing in hallway with walker; calm, cooperative. Pt states "I'm feeling good." Pt c/o "achy" R foot pain due to surgery prior to current hospital admission. She declined offer for pain medication, stating "I take enough pills already." She currently denies SI/HI/AVH and depression. She describes her sleep as "good" and her appetite as "very good". She expresses feelings of anxiety and says that she is "anxious to go home". No acute distress noted. ?

## 2021-07-05 NOTE — Progress Notes (Signed)
Recreation Therapy Notes ? ?INPATIENT RECREATION TR PLAN ? ?Patient Details ?Name: Sandra Charles ?MRN: [id number] ?DOB: Jan 21, 1956 ?Today's Date: 07/05/2021 ? ?Rec Therapy Plan ?Is patient appropriate for Therapeutic Recreation?: Yes ?Treatment times per week: at least 3 ?Estimated Length of Stay: 5-7 days ?TR Treatment/Interventions: Group participation (Comment) ? ?Discharge Criteria ?Pt will be discharged from therapy if:: Discharged ?Treatment plan/goals/alternatives discussed and agreed upon by:: Patient/family ? ?Discharge Summary ?  ? ? ?Daden Mahany ?07/05/2021, 3:25 PM ?

## 2021-07-05 NOTE — BHH Counselor (Signed)
Adult Comprehensive Assessment ? ?Patient ID: Sandra Charles, female   DOB: 17-Jun-1955, 66 y.o.   MRN: [id number] ? ?Information Source: ?Information source: Patient ? ?Current Stressors:  ?Patient states their primary concerns and needs for treatment are:: "siezures" ?Patient states their goals for this hospitilization and ongoing recovery are:: "work on siezures" ?Educational / Learning stressors: Pt denies ?Employment / Job issues: Pt denies ?Family Relationships: Pt denies ?Financial / Lack of resources (include bankruptcy): Pt denies ?Housing / Lack of housing: Pt denies ?Physical health (include injuries & life threatening diseases): Pt denies ?Social relationships: Pt denies ?Substance abuse: Pt denies ?Bereavement / Loss: Pt denies ? ?Living/Environment/Situation:  ?Living Arrangements: Spouse/significant other ?Who else lives in the home?: "a long time" ?How long has patient lived in current situation?: "fine" ?What is atmosphere in current home: Comfortable ? ?Family History:  ?Marital status: Long term relationship ?Long term relationship, how long?: 30 years ?What types of issues is patient dealing with in the relationship?: Pt denies ?Are you sexually active?: No ?What is your sexual orientation?: unable to assess ?Has your sexual activity been affected by drugs, alcohol, medication, or emotional stress?: Pt denies ?Does patient have children?: Yes ?How many children?: 1 ?How is patient's relationship with their children?: "good" ? ?Childhood History:  ?By whom was/is the patient raised?: Both parents ?Description of patient's relationship with caregiver when they were a child: "good" ?Patient's description of current relationship with people who raised him/her: "they're deceased" ?How were you disciplined when you got in trouble as a child/adolescent?: "wasn't really" ?Does patient have siblings?: Yes ?Number of Siblings: 2 (1 sister, 1 brother or sister, pt was unclear) ?Did patient suffer any  verbal/emotional/physical/sexual abuse as a child?: No ?Did patient suffer from severe childhood neglect?: No ?Was the patient ever a victim of a crime or a disaster?: No ?Witnessed domestic violence?: No ?Has patient been affected by domestic violence as an adult?: No ? ?Education:  ?Highest grade of school patient has completed: 12th grade ?Currently a student?: No ?Learning disability?: No ? ?Employment/Work Situation:   ?Employment Situation: Retired ?Patient's Job has Been Impacted by Current Illness: No ?What is the Longest Time Patient has Held a Job?: Pt denies ?Has Patient ever Been in the Military?: No ? ?Financial Resources:   ?Museum/gallery curator resources: [employer], Medicare ?Does patient have a representative payee or guardian?: No ? ?Alcohol/Substance Abuse:   ?What has been your use of drugs/alcohol within the last 12 months?: Pt states that she does not currently use any substances ?Alcohol/Substance Abuse Treatment Hx: Denies past history ?Has alcohol/substance abuse ever caused legal problems?: No ? ?Social Support System:   ?Heritage manager System: Fair ?Describe Community Support System: son, significant other, niece ?Type of faith/religion: unable to assess ?How does patient's faith help to cope with current illness?: unable to assess ? ?Leisure/Recreation:   ?  ? ?Strengths/Needs:   ?What is the patient's perception of their strengths?: "normal things" ?Patient states they can use these personal strengths during their treatment to contribute to their recovery: "so so" ?Patient states these barriers may affect/interfere with their treatment: Pt denies ?Patient states these barriers may affect their return to the community: Pt denies ?Other important information patient would like considered in planning for their treatment: Pt denies ? ?Discharge Plan:   ?Currently receiving community mental health services: No (Pt has PCP Dr. Modena Morrow, [facility]) ?Patient states they  will know when they are safe and ready for discharge when: "when they  let me go" ?Does patient have access to transportation?: Yes ?Does patient have financial barriers related to discharge medications?: No ?Will patient be returning to same living situation after discharge?: Yes ? ?Summary/Recommendations:   ?Summary and Recommendations (to be completed by the evaluator): Patient is a 66 year old female in a long-term relationship from [place], [place]  She reports that she receives SSI and is retired. She presents to the hospital following report from family member that pt was exhibiting paranoid behavior and had stopped taking medications. Recent stressors include not taking medications as prescribed, bizarre behaviors and hx of schizoaffective disorder. She has a primary diagnosis of  Schizoaffective disorder, bipolar type. Patient stated she is not interested in aftercare referral for psychiatry or therapy and reports to have a primary care physician.  Dr. Modena Morrow, [facility] per chart review. Patient has no insight and is a poor historian. Patient has flat affect, mood congruent. Patient is calm and coorperative during assessment, low speech and poverty of speech. Recommendations include: crisis stabilization, therapeutic milieu, encourage group attendance and participation, medication management for mood stabilization and development of comprehensive mental wellness plan. ? ?Sandra Charles. 07/05/2021 ?

## 2021-07-05 NOTE — Progress Notes (Signed)
[facility] MD Progress Note ? ?07/05/2021 11:44 AM ?Sandra Charles  ?MRN:  [id number] ?Subjective: Sandra Charles is seen on rounds.  She has been compliant with her medications.  She states that she is sleeping well and her mood is good.  She asks about going home and I talked with some social work about planning on getting her discharged by Friday.  The problem is been that she has not given permission to talk to the family so hopefully this will be a hold up.  She denies any side effects from her medication and there is no evidence of EPS or TD.  She denies any auditory or visual hallucinations.  She denies any depression or suicidal ideation. ? ?Principal Problem: Schizoaffective disorder, bipolar type ([place]) ?Diagnosis: Principal Problem: ?  Schizoaffective disorder, bipolar type ([place]) ? ?Total Time spent with patient: 15 minutes ? ?Past Psychiatric History: Reportedly she was diagnosed with schizoaffective disorder bipolar type.  Previous admission at [facility] in 2017.  The patient reports one hospitalization at [facility] several years ago. According to the family she should be taking Seroquel and Celexa. In reviewing her chart and noticed that she has been prescribed Celexa, Xanax, Depakote, and Lamictal from [place] facilities. In addition to Xanax narcotic painkillers also on her list. The patient denies ever attempting suicide. ? ?Past Medical History:  ?Past Medical History:  ?Diagnosis Date  ? Anxiety   ? Arthritis   ? "all over"  ? Chronic lower back pain   ? Depression   ? GERD (gastroesophageal reflux disease)   ? Headache   ? "weekly" (07/15/2015)  ? Hyperlipidemia   ? Hypertension   ? Mini stroke   ? "several since 05/2014" (07/15/2015)  ? Seizures ([place]) dx'd 04/2015  ?  ?Past Surgical History:  ?Procedure Laterality Date  ? DILATION AND CURETTAGE OF UTERUS    ? LUMBAR PUNCTURE  07/14/2015  ? ORIF ANKLE FRACTURE Right 01/20/2021  ? Procedure: OPEN REDUCTION INTERNAL FIXATION (ORIF) ANKLE FRACTURE;  Surgeon: Corky Mull, MD;   Location: [facility] ORS;  Service: Orthopedics;  Laterality: Right;  ? TUBAL LIGATION    ? VAGINAL HYSTERECTOMY    ? ?Family History:  ?Family History  ?Problem Relation Age of Onset  ? Hyperlipidemia Mother   ? Breast cancer Mother 50  ? Hypertension Mother   ? Heart attack Mother   ?     age 55's  ? Hyperlipidemia Father   ? Hypertension Father   ? Heart attack Father 107  ? Breast cancer Sister 56  ?Social History:  ?Social History  ? ?Substance and Sexual Activity  ?Alcohol Use Not Currently  ? Comment: 07/15/2015 "nothing in the last few years"  ?   ?Social History  ? ?Substance and Sexual Activity  ?Drug Use No  ?  ?Social History  ? ?Socioeconomic History  ? Marital status: Single  ?  Spouse name: Not on file  ? Number of children: 1  ? Years of education: Not on file  ? Highest education level: Not on file  ?Occupational History  ? Occupation: disabled  ?Tobacco Use  ? Smoking status: Former  ?  Packs/day: 1.00  ?  Years: 5.00  ?  Pack years: 5.00  ?  Types: Cigarettes  ? Smokeless tobacco: Never  ? Tobacco comments:  ?  "quit smoking cigarettes in the early 2000s"  ?Substance and Sexual Activity  ? Alcohol use: Not Currently  ?  Comment: 07/15/2015 "nothing in the last few years"  ?  Drug use: No  ? Sexual activity: Not Currently  ?Other Topics Concern  ? Not on file  ?Social History Narrative  ? Lives with significant other, has son who comes by daily.  ? ?Social Determinants of Health  ? ?Financial Resource Strain: Not on file  ?Food Insecurity: Not on file  ?Transportation Needs: Not on file  ?Physical Activity: Not on file  ?Stress: Not on file  ?Social Connections: Not on file  ? ?Additional Social History:  ?  ?  ?  ?  ?  ?  ?  ?  ?  ?  ?  ? ?Sleep: Good ? ?Appetite:  Good ? ?Current Medications: ?Current Facility-Administered Medications  ?Medication Dose Route Frequency Provider Last Rate Last Admin  ? acetaminophen (TYLENOL) tablet 650 mg  650 mg Oral Q6H PRN Patrecia Pour, NP      ? alum & mag  hydroxide-simeth (MAALOX/MYLANTA) 200-200-20 MG/5ML suspension 30 mL  30 mL Oral Q4H PRN Patrecia Pour, NP      ? amLODipine (NORVASC) tablet 10 mg  10 mg Oral Daily Patrecia Pour, NP   10 mg at 07/05/21 0857  ? aspirin EC tablet 81 mg  81 mg Oral Daily Patrecia Pour, NP   81 mg at 07/05/21 0300  ? cloBAZam (ONFI) tablet 5 mg  5 mg Oral QHS Patrecia Pour, NP   5 mg at 07/04/21 2215  ? cloNIDine (CATAPRES) tablet 0.1 mg  0.1 mg Oral BID Patrecia Pour, NP   0.1 mg at 07/05/21 0857  ? divalproex (DEPAKOTE) DR tablet 250 mg  250 mg Oral Q12H Patrecia Pour, NP   250 mg at 07/05/21 0857  ? donepezil (ARICEPT) tablet 10 mg  10 mg Oral QHS Patrecia Pour, NP   10 mg at 07/04/21 2214  ? ibuprofen (ADVIL) tablet 800 mg  800 mg Oral Q6H PRN Patrecia Pour, NP   800 mg at 07/02/21 2132  ? lamoTRIgine (LAMICTAL) tablet 125 mg  125 mg Oral BID Patrecia Pour, NP   125 mg at 07/05/21 9233  ? magnesium hydroxide (MILK OF MAGNESIA) suspension 30 mL  30 mL Oral Daily PRN Patrecia Pour, NP      ? pantoprazole (PROTONIX) EC tablet 40 mg  40 mg Oral Daily Patrecia Pour, NP   40 mg at 07/05/21 0857  ? propranolol ER (INDERAL LA) 24 hr capsule 60 mg  60 mg Oral Daily Patrecia Pour, NP   60 mg at 07/05/21 0857  ? QUEtiapine (SEROQUEL XR) 24 hr tablet 50 mg  50 mg Oral QHS Patrecia Pour, NP   50 mg at 07/04/21 2217  ? rosuvastatin (CRESTOR) tablet 20 mg  20 mg Oral QHS Patrecia Pour, NP   20 mg at 07/04/21 2215  ? sertraline (ZOLOFT) tablet 100 mg  100 mg Oral Daily Patrecia Pour, NP   100 mg at 07/05/21 0857  ? vitamin B-12 (CYANOCOBALAMIN) tablet 1,000 mcg  1,000 mcg Oral Daily Patrecia Pour, NP   1,000 mcg at 07/05/21 0076  ? ? ?Lab Results:  ?Results for orders placed or performed during the hospital encounter of 07/02/21 (from the past 48 hour(s))  ?Lipid panel     Status: Abnormal  ? Collection Time: 07/05/21 10:13 AM  ?Result Value Ref Range  ? Cholesterol 190 0 - 200 mg/dL  ? Triglycerides 100 <150 mg/dL   ? HDL 49 >40 mg/dL  ?  Total CHOL/HDL Ratio 3.9 RATIO  ? VLDL 20 0 - 40 mg/dL  ? LDL Cholesterol 121 (H) 0 - 99 mg/dL  ?  Comment:        ?Total Cholesterol/HDL:CHD Risk ?Coronary Heart Disease Risk Table ?                    Men   Women ? 1/2 Average Risk   3.4   3.3 ? Average Risk       5.0   4.4 ? 2 X Average Risk   9.6   7.1 ? 3 X Average Risk  23.4   11.0 ?       ?Use the calculated Patient Ratio ?above and the CHD Risk Table ?to determine the patient's CHD Risk. ?       ?ATP III CLASSIFICATION (LDL): ? <100     mg/dL   Optimal ? 100-129  mg/dL   Near or Above ?                   Optimal ? 130-159  mg/dL   Borderline ? 160-189  mg/dL   High ? >190     mg/dL   Very High ?Performed at [facility], [address] [place], [place] [address] ?  ? ? ?Blood Alcohol level:  ?Lab Results  ?Component Value Date  ? ETH <10 07/01/2021  ? ETH <10 01/29/2021  ? ? ?Metabolic Disorder Labs: ?Lab Results  ?Component Value Date  ? HGBA1C 6.1 (H) 08/10/2015  ? ?Lab Results  ?Component Value Date  ? PROLACTIN 9.4 08/10/2015  ? PROLACTIN 7.1 07/12/2015  ? ?Lab Results  ?Component Value Date  ? CHOL 190 07/05/2021  ? TRIG 100 07/05/2021  ? HDL 49 07/05/2021  ? CHOLHDL 3.9 07/05/2021  ? VLDL 20 07/05/2021  ? LDLCALC 121 (H) 07/05/2021  ? [place] 48 08/10/2015  ? ? ?Physical Findings: ?AIMS:  , ,  ,  ,    ?CIWA:    ?COWS:    ? ?Musculoskeletal: ?Strength & Muscle Tone: within normal limits ?Gait & Station: normal ?Patient leans: N/A ? ?Psychiatric Specialty Exam: ? ?Presentation  ?General Appearance: Appropriate for Environment ? ?Eye Contact:Good ? ?Speech:Clear and Coherent ? ?Speech Volume:Normal ? ?Handedness:Right ? ? ?Mood and Affect  ?Mood:Euthymic ? ?Affect:Appropriate ? ? ?Thought Process  ?Thought Processes:Coherent ? ?Descriptions of Associations:Intact ? ?Orientation:Full (Time, Place and Person) ? ?Thought Content:WDL ? ?History of Schizophrenia/Schizoaffective disorder:Yes ? ?Duration of Psychotic  Symptoms:Greater than six months ? ?Hallucinations:No data recorded ?Ideas of Reference:None ? ?Suicidal Thoughts:No data recorded ?Homicidal Thoughts:No data recorded ? ?Sensorium  ?Memory:Immediate Fair ? ?Judgment:G

## 2021-07-05 NOTE — Progress Notes (Signed)
Patient denies SI, HI, and AVH. Patient denies pain. She says "I just don't feel good", but will not elaborate what she does not feel good about. Patient is disoriented to time, place, and situation. Patient says, "I just want to go home. I live with my friend. There is nothing wrong with me, I am just tired." Patient came out for breakfast. She is compliant with morning medications. Patient interacts minimally with others. Patient remains safe on the unit at this time. ?

## 2021-07-05 NOTE — Progress Notes (Signed)
Pt exited bedroom without walker, stood in hallway and asked "Why am I in the hospital?" Pt escorted back to bed and reoriented to time and place. Pt stated that she thought that she was home and awoke in the hospital. Pt tearful and did not understand the reason that she was in the hospital and stated that she wanted to go home.  ?

## 2021-07-05 NOTE — BHH Suicide Risk Assessment (Signed)
B[facility] INPATIENT:  Family/Significant Other Suicide Prevention Education ? ?Suicide Prevention Education:  ?SPE completed with pt, as pt refused to consent to family contact. SPI pamphlet provided to pt and pt was encouraged to share information with support network, ask questions, and talk about any concerns relating to SPE. Pt denies access to guns/firearms and verbalized understanding of information provided. Mobile Crisis information also provided to pt.  ? ?Patient Refusal for Family/Significant Other Suicide Prevention Education: The patient Sandra Charles has refused to provide written consent for family/significant other to be provided Family/Significant Other Suicide Prevention Education during admission and/or prior to discharge.  Physician notified. ? ?[name] ?07/05/2021, 4:20 PM ?

## 2021-07-05 NOTE — Progress Notes (Signed)
Pt lying in bed with eyes closed; easily aroused when name called. Pt disoriented to time, place and situation. Pt states that she is "feeling alright". She denies pain and SI/HI/AVH at this time. She describes her sleep as "good" and her appetite as "so-so". She expresses feelings of anxiety and depression and says "I'm anxious to get out of here because I don't know where I'm at." Pt reoriented again to her current location. No acute distress noted. ?

## 2021-07-06 DIAGNOSIS — F250 Schizoaffective disorder, bipolar type: Secondary | ICD-10-CM | POA: Diagnosis not present

## 2021-07-06 MED ORDER — QUETIAPINE FUMARATE 25 MG PO TABS
50.0000 mg | ORAL_TABLET | Freq: Every day | ORAL | Status: DC
  Administered 2021-07-06: 50 mg via ORAL
  Filled 2021-07-06: qty 2

## 2021-07-06 NOTE — Progress Notes (Signed)
Patient is calm and cooperative with assessment. Patient says, "I am not feeling good. I want to stay in bed." Patient will not elaborate on what is not feeling good. She denies SI, HI, AVH, and pain. Patient came out for breakfast, but returned to her room. Patient interacts minimally with others. She is disoriented to time and situation. Patient is compliant with scheduled medications. Patient remains safe on the unit at this time. ?

## 2021-07-06 NOTE — Progress Notes (Signed)
[facility] MD Progress Note ? ?07/06/2021 11:33 AM ?Sandra Charles  ?MRN:  [id number] ?Subjective: Sandra Charles states that she does not feel well today.  She is unable to tell me why but the nurses state that she said the same thing to them and she thought it was medication she was taking at night is making her tired the next day.  She is on Seroquel XR Emina changed to regular Seroquel.  She feels ready for discharge and she has been eating and sleeping well.  We will see tomorrow if that makes any difference. ? ?Principal Problem: Schizoaffective disorder, bipolar type ([place]) ?Diagnosis: Principal Problem: ?  Schizoaffective disorder, bipolar type ([place]) ? ?Total Time spent with patient: 15 minutes ? ?Past Psychiatric History:  Reportedly she was diagnosed with schizoaffective disorder bipolar type.  Previous admission at [facility] in 2017.  The patient reports one hospitalization at [facility] several years ago. According to the family she should be taking Seroquel and Celexa. In reviewing her chart and noticed that she has been prescribed Celexa, Xanax, Depakote, and Lamictal from [place] facilities. In addition to Xanax narcotic painkillers also on her list. The patient denies ever attempting suicide. ? ?Past Medical History:  ?Past Medical History:  ?Diagnosis Date  ? Anxiety   ? Arthritis   ? "all over"  ? Chronic lower back pain   ? Depression   ? GERD (gastroesophageal reflux disease)   ? Headache   ? "weekly" (07/15/2015)  ? Hyperlipidemia   ? Hypertension   ? Mini stroke   ? "several since 05/2014" (07/15/2015)  ? Seizures ([place]) dx'd 04/2015  ?  ?Past Surgical History:  ?Procedure Laterality Date  ? DILATION AND CURETTAGE OF UTERUS    ? LUMBAR PUNCTURE  07/14/2015  ? ORIF ANKLE FRACTURE Right 01/20/2021  ? Procedure: OPEN REDUCTION INTERNAL FIXATION (ORIF) ANKLE FRACTURE;  Surgeon: Corky Mull, MD;  Location: [facility] ORS;  Service: Orthopedics;  Laterality: Right;  ? TUBAL LIGATION    ? VAGINAL HYSTERECTOMY    ? ?Family History:   ?Family History  ?Problem Relation Age of Onset  ? Hyperlipidemia Mother   ? Breast cancer Mother 80  ? Hypertension Mother   ? Heart attack Mother   ?     age 45's  ? Hyperlipidemia Father   ? Hypertension Father   ? Heart attack Father 30  ? Breast cancer Sister 1  ? ? ?Social History:  ?Social History  ? ?Substance and Sexual Activity  ?Alcohol Use Not Currently  ? Comment: 07/15/2015 "nothing in the last few years"  ?   ?Social History  ? ?Substance and Sexual Activity  ?Drug Use No  ?  ?Social History  ? ?Socioeconomic History  ? Marital status: Single  ?  Spouse name: Not on file  ? Number of children: 1  ? Years of education: Not on file  ? Highest education level: Not on file  ?Occupational History  ? Occupation: disabled  ?Tobacco Use  ? Smoking status: Former  ?  Packs/day: 1.00  ?  Years: 5.00  ?  Pack years: 5.00  ?  Types: Cigarettes  ? Smokeless tobacco: Never  ? Tobacco comments:  ?  "quit smoking cigarettes in the early 2000s"  ?Substance and Sexual Activity  ? Alcohol use: Not Currently  ?  Comment: 07/15/2015 "nothing in the last few years"  ? Drug use: No  ? Sexual activity: Not Currently  ?Other Topics Concern  ? Not on file  ?  Social History Narrative  ? Lives with significant other, has son who comes by daily.  ? ?Social Determinants of Health  ? ?Financial Resource Strain: Not on file  ?Food Insecurity: Not on file  ?Transportation Needs: Not on file  ?Physical Activity: Not on file  ?Stress: Not on file  ?Social Connections: Not on file  ? ?Additional Social History:  ?  ?  ?  ?  ?  ?  ?  ?  ?  ?  ?  ? ?Sleep: Good ? ?Appetite:  Good ? ?Current Medications: ?Current Facility-Administered Medications  ?Medication Dose Route Frequency Provider Last Rate Last Admin  ? acetaminophen (TYLENOL) tablet 650 mg  650 mg Oral Q6H PRN Patrecia Pour, NP      ? alum & mag hydroxide-simeth (MAALOX/MYLANTA) 200-200-20 MG/5ML suspension 30 mL  30 mL Oral Q4H PRN Patrecia Pour, NP      ? amLODipine  (NORVASC) tablet 10 mg  10 mg Oral Daily Patrecia Pour, NP   10 mg at 07/06/21 8299  ? aspirin EC tablet 81 mg  81 mg Oral Daily Patrecia Pour, NP   81 mg at 07/06/21 0912  ? cloBAZam (ONFI) tablet 5 mg  5 mg Oral QHS Patrecia Pour, NP   5 mg at 07/05/21 2127  ? cloNIDine (CATAPRES) tablet 0.1 mg  0.1 mg Oral BID Patrecia Pour, NP   0.1 mg at 07/06/21 3716  ? divalproex (DEPAKOTE) DR tablet 250 mg  250 mg Oral Q12H Patrecia Pour, NP   250 mg at 07/06/21 9678  ? donepezil (ARICEPT) tablet 10 mg  10 mg Oral QHS Patrecia Pour, NP   10 mg at 07/05/21 2127  ? ibuprofen (ADVIL) tablet 800 mg  800 mg Oral Q6H PRN Patrecia Pour, NP   800 mg at 07/02/21 2132  ? lamoTRIgine (LAMICTAL) tablet 125 mg  125 mg Oral BID Patrecia Pour, NP   125 mg at 07/06/21 9381  ? magnesium hydroxide (MILK OF MAGNESIA) suspension 30 mL  30 mL Oral Daily PRN Patrecia Pour, NP      ? pantoprazole (PROTONIX) EC tablet 40 mg  40 mg Oral Daily Patrecia Pour, NP   40 mg at 07/06/21 0912  ? propranolol ER (INDERAL LA) 24 hr capsule 60 mg  60 mg Oral Daily Patrecia Pour, NP   60 mg at 07/06/21 0175  ? QUEtiapine (SEROQUEL) tablet 50 mg  50 mg Oral QHS Parks Ranger, DO      ? rosuvastatin (CRESTOR) tablet 20 mg  20 mg Oral QHS Patrecia Pour, NP   20 mg at 07/05/21 2128  ? sertraline (ZOLOFT) tablet 100 mg  100 mg Oral Daily Patrecia Pour, NP   100 mg at 07/06/21 1025  ? vitamin B-12 (CYANOCOBALAMIN) tablet 1,000 mcg  1,000 mcg Oral Daily Patrecia Pour, NP   1,000 mcg at 07/06/21 8527  ? ? ?Lab Results:  ?Results for orders placed or performed during the hospital encounter of 07/02/21 (from the past 48 hour(s))  ?Hemoglobin A1c     Status: None  ? Collection Time: 07/05/21 10:13 AM  ?Result Value Ref Range  ? Hgb A1c MFr Bld 5.6 4.8 - 5.6 %  ?  Comment: (NOTE) ?Pre diabetes:          5.7%-6.4% ? ?Diabetes:              >6.4% ? ?Glycemic control for   <  7.0% ?adults with diabetes ?  ? Mean Plasma Glucose 114.02 mg/dL   ?  Comment: Performed at [facility], [place] [address], [place], [place] [address]  ?Lipid panel     Status: Abnormal  ? Collection Time: 07/05/21 10:13 AM  ?Result Value Ref Range  ? Cholesterol 190 0 - 200 mg/dL  ? Triglycerides 100 <150 mg/dL  ? HDL 49 >40 mg/dL  ? Total CHOL/HDL Ratio 3.9 RATIO  ? VLDL 20 0 - 40 mg/dL  ? LDL Cholesterol 121 (H) 0 - 99 mg/dL  ?  Comment:        ?Total Cholesterol/HDL:CHD Risk ?Coronary Heart Disease Risk Table ?                    Men   Women ? 1/2 Average Risk   3.4   3.3 ? Average Risk       5.0   4.4 ? 2 X Average Risk   9.6   7.1 ? 3 X Average Risk  23.4   11.0 ?       ?Use the calculated Patient Ratio ?above and the CHD Risk Table ?to determine the patient's CHD Risk. ?       ?ATP III CLASSIFICATION (LDL): ? <100     mg/dL   Optimal ? 100-129  mg/dL   Near or Above ?                   Optimal ? 130-159  mg/dL   Borderline ? 160-189  mg/dL   High ? >190     mg/dL   Very High ?Performed at [facility], [address], [place], [place] [address] ?  ? ? ?Blood Alcohol level:  ?Lab Results  ?Component Value Date  ? ETH <10 07/01/2021  ? ETH <10 01/29/2021  ? ? ?Metabolic Disorder Labs: ?Lab Results  ?Component Value Date  ? HGBA1C 5.6 07/05/2021  ? MPG 114.02 07/05/2021  ? ?Lab Results  ?Component Value Date  ? PROLACTIN 9.4 08/10/2015  ? PROLACTIN 7.1 07/12/2015  ? ?Lab Results  ?Component Value Date  ? CHOL 190 07/05/2021  ? TRIG 100 07/05/2021  ? HDL 49 07/05/2021  ? CHOLHDL 3.9 07/05/2021  ? VLDL 20 07/05/2021  ? LDLCALC 121 (H) 07/05/2021  ? [place] 48 08/10/2015  ? ? ?Physical Findings: ?AIMS:  , ,  ,  ,    ?CIWA:    ?COWS:    ? ?Musculoskeletal: ?Strength & Muscle Tone: within normal limits ?Gait & Station: normal ?Patient leans: N/A ? ?Psychiatric Specialty Exam: ? ?Presentation  ?General Appearance: Appropriate for Environment ? ?Eye Contact:Good ? ?Speech:Clear and Coherent ? ?Speech Volume:Normal ? ?Handedness:Right ? ? ?Mood and Affect   ?Mood:Euthymic ? ?Affect:Appropriate ? ? ?Thought Process  ?Thought Processes:Coherent ? ?Descriptions of Associations:Intact ? ?Orientation:Full (Time, Place and Person) ? ?Thought Content:WDL ? ?History of Schizophrenia/Schi

## 2021-07-06 NOTE — Progress Notes (Signed)
Recreation Therapy Notes ? ?  ?Date: 07/06/2021 ?  ?Time: 1:40 PM  ?  ?Location: Courtyard  ?  ?Behavioral response: Appropriate ?  ?Intervention Topic:  Social Skills  ?  ?Discussion/Intervention:  ?Group content on today was focused on social skills. The group defined social skills and identified ways they use social skills. Patients expressed what obstacles they face when trying to be social. Participants described the importance of social skills. The group listed ways to improve social skills and reasons to improve social skills. Individuals had an opportunity to learn new and improve social skills as well as identify their weaknesses. ?  ?Clinical Observations/Feedback: ?Patient came to group and was pleasant and cooperative during group. Participant was focused on the topic and task during group. Individual was social with peers and staff while participating in the intervention. Patient began to complain about her foot towards the end of group.  ?Cayleen Benjamin LRT/CTRS  ?  ? ? ? ? ? ? ? ?Alazne Quant ?07/06/2021 2:35 PM ?

## 2021-07-06 NOTE — BHH Counselor (Signed)
CSW received verbal permission form pt to speak with pt's son, Kalya Troeger [facility]) [phone]. CSW contacted [name] and discussed pt pending discharge for Friday. He stated that he or his cousin could provide transportation at discharge, time TBD. CSW will follow up regarding time availability tomorrow.  ? ?Vernis Cabacungan [place], MSW, LCSW-A ?5/11/20234:39 PM  ?

## 2021-07-06 NOTE — Progress Notes (Signed)
Pt lying in bed with eyes closed; easily aroused when name called. Pt states "I feel alright." She currently denies pain and SI/HI/AVH. She describes her sleep and appetite as "good". She denies depression at this time; however, she expresses feelings of anxiety because she is "leaving" but says that she is ready to go home. No acute distress noted. ?

## 2021-07-07 DIAGNOSIS — F250 Schizoaffective disorder, bipolar type: Secondary | ICD-10-CM | POA: Diagnosis not present

## 2021-07-07 MED ORDER — DIVALPROEX SODIUM 250 MG PO DR TAB: 250.0000 mg | DELAYED_RELEASE_TABLET | Freq: Two times a day (BID) | ORAL | 3 refills | Status: DC

## 2021-07-07 MED ORDER — QUETIAPINE FUMARATE 50 MG PO TABS
50.0000 mg | ORAL_TABLET | Freq: Every day | ORAL | 3 refills | Status: DC
Start: 2021-07-07 — End: 2023-02-14

## 2021-07-07 NOTE — Plan of Care (Signed)
?  Problem: Group Participation ?Goal: STG - Patient will engage in groups without prompting or encouragement from LRT x3 group sessions within 5 recreation therapy group sessions ?Description: STG - Patient will engage in groups without prompting or encouragement from LRT x3 group sessions within 5 recreation therapy group sessions ?07/07/2021 1508 by Ernest Haber, LRT ?Outcome: Adequate for Discharge ?07/07/2021 1508 by Ernest Haber, LRT ?Outcome: Adequate for Discharge ?  ?

## 2021-07-07 NOTE — Progress Notes (Signed)
Recreation Therapy Notes ? ?INPATIENT RECREATION TR PLAN ? ?Patient Details ?Name: Sandra Charles ?MRN: [id number] ?DOB: 1955-07-08 ?Today's Date: 07/07/2021 ? ?Rec Therapy Plan ?Is patient appropriate for Therapeutic Recreation?: Yes ?Treatment times per week: at least 3 ?Estimated Length of Stay: 5-7 days ?TR Treatment/Interventions: Group participation (Comment) ? ?Discharge Criteria ?Pt will be discharged from therapy if:: Discharged ?Treatment plan/goals/alternatives discussed and agreed upon by:: Patient/family ? ?Discharge Summary ?Short term goals set: Patient will engage in groups without prompting or encouragement from LRT x3 group sessions within 5 recreation therapy group sessions ?Short term goals met: Adequate for discharge ?Progress toward goals comments: Groups attended ?Which groups?: Social skills ?Reason goals not met: N/A ?Therapeutic equipment acquired: N/A ?Reason patient discharged from therapy: Discharge from hospital ?Pt/family agrees with progress & goals achieved: Yes ?Date patient discharged from therapy: 07/07/21 ? ? ?Lanasia Porras ?07/07/2021, 3:10 PM ?

## 2021-07-07 NOTE — Progress Notes (Signed)
Patient alert and oriented x 2. Affect is pleasant and calm. Denies anxiety, SI/HI or AVH.  Patient states they will try to keep themselves safe when they return home. ? ?Reviewed discharge instructions with patient including follow up appointment with provider, medication and prescriptions. Questions answered and understanding verbalized.  Discharge packet given. All belongings returned to patient after verification completed by staff.   ? ?Patient escorted by staff off unit at this time stable without complaint. ?

## 2021-07-07 NOTE — Progress Notes (Signed)
?  [facility] Adult Case Management Discharge Plan : ? ?Will you be returning to the same living situation after discharge:  Yes,  pt is returning to her home ?At discharge, do you have transportation home?: Yes,  pt's significant other will be providing transporation ?Do you have the ability to pay for your medications: Yes,  pt has [facility] Medicare ? ?Release of information consent forms completed and in the chart;  Patient's signature needed at discharge. ? ?Patient to Follow up at: ? Follow-up Information   ? ? [facility] Follow up.   ?Why: Though you declined follow up here is a resource that you can use as needed. You can attend walk-in hours Monday-Friday 8:30am-3pm daily to schedule your appointment. Thanks! ?Contact information: ?[address] ?[place] [address] ?[phone] ? ? ?  ?  ? ? [facility] Follow up.   ?Specialty: Urgent Care ?Why: Though you declined follow up services, here is a resource you can use as needed. For medication management, please attend walk-in hours from 7:30am-11am Monday, Wednesday-Friday to schedule your appointment. For therapy, please attend walk-in hours from 7:30am-11am Monday and Wednesday. Thanks! ?Contact information: ?[address] ?[place] ?[phone] ? ?  ?  ? ?  ?  ? ?  ? ? ?Next level of care provider has access to [place] ? ?Safety Planning and Suicide Prevention discussed: Yes,  SPE completed with pt's son ? ?  ? ?Has patient been referred to the Quitline?: Patient refused referral ? ?Patient has been referred for addiction treatment: N/A ? ?Tanise Russman A [place], LCSWA ?07/07/2021, 10:44 AM ?

## 2021-07-07 NOTE — BHH Counselor (Signed)
CSW met with pt to discuss discharge planning. Pt stated that she was not interested in a referral for therapy or psychiatry and declined follow up. Pt stated that her significant other will be providing transportation at discharge and will be at [facility] around 1pm.  ? ?Renji Berwick [place], MSW, LCSW-A ?5/12/202310:20 AM  ?

## 2021-07-07 NOTE — Plan of Care (Signed)
Patient is alert and oriented to self. Reports she feels ok this morning. Mood and affect flat. Patients thoughts are tangential and she is focused on her boyfriend picking her up. Patient denies pain at this time. She denies SI, HI, and AVH. Also denies feelings of anxiety and depression at this time. States she slept good last night. Morning meds given whole by mouth W/O difficulty. Ate breakfast in day room- appetite good. Patient remains on unit with Q15 minute checks in place.  ? ? ?Problem: Education: ?Goal: Utilization of techniques to improve thought processes will improve ?Outcome: Not Progressing ?Goal: Knowledge of the prescribed therapeutic regimen will improve ?Outcome: Not Progressing ?  ?Problem: Activity: ?Goal: Interest or engagement in leisure activities will improve ?Outcome: Not Progressing ?Goal: Imbalance in normal sleep/wake cycle will improve ?Outcome: Progressing ?  ?Problem: Coping: ?Goal: Coping ability will improve ?Outcome: Progressing ?Goal: Will verbalize feelings ?Outcome: Progressing ?  ?Problem: Health Behavior/Discharge Planning: ?Goal: Ability to make decisions will improve ?Outcome: Not Progressing ?Goal: Compliance with therapeutic regimen will improve ?Outcome: Progressing ?  ?Problem: Role Relationship: ?Goal: Will demonstrate positive changes in social behaviors and relationships ?Outcome: Not Progressing ?  ?Problem: Safety: ?Goal: Ability to disclose and discuss suicidal ideas will improve ?Outcome: Progressing ?Goal: Ability to identify and utilize support systems that promote safety will improve ?Outcome: Not Progressing ?  ?Problem: Self-Concept: ?Goal: Will verbalize positive feelings about self ?Outcome: Not Progressing ?Goal: Level of anxiety will decrease ?Outcome: Progressing ?  ?

## 2021-07-07 NOTE — BHH Suicide Risk Assessment (Signed)
[facility] Discharge Suicide Risk Assessment ? ? ?Principal Problem: Schizoaffective disorder, bipolar type ([place]) ?Discharge Diagnoses: Principal Problem: ?  Schizoaffective disorder, bipolar type ([place]) ? ? ?Total Time spent with patient: 1 hour ? ?Musculoskeletal: ?Strength & Muscle Tone: within normal limits ?Gait & Station: normal ?Patient leans: N/A ? ?Psychiatric Specialty Exam ? ?Presentation  ?General Appearance: Appropriate for Environment ? ?Eye Contact:Good ? ?Speech:Clear and Coherent ? ?Speech Volume:Normal ? ?Handedness:Right ? ? ?Mood and Affect  ?Mood:Euthymic ? ?Duration of Depression Symptoms: No data recorded ?Affect:Appropriate ? ? ?Thought Process  ?Thought Processes:Coherent ? ?Descriptions of Associations:Intact ? ?Orientation:Full (Time, Place and Person) ? ?Thought Content:WDL ? ?History of Schizophrenia/Schizoaffective disorder:Yes ? ?Duration of Psychotic Symptoms:Greater than six months ? ?Hallucinations:No data recorded ?Ideas of Reference:None ? ?Suicidal Thoughts:No data recorded ?Homicidal Thoughts:No data recorded ? ?Sensorium  ?Memory:Immediate Fair ? ?Judgment:Good ? ?Insight:Fair ? ? ?Executive Functions  ?Concentration:Fair ? ?Attention Span:Good ? ?Recall:Fair ? ?[place] ? ?Language:Fair ? ? ?Psychomotor Activity  ?Psychomotor Activity:No data recorded ? ?Assets  ?Assets:Communication Skills ? ? ?Sleep  ?Sleep:No data recorded ? ?Physical Exam: ?Physical Exam ?Vitals and nursing note reviewed.  ?Constitutional:   ?   Appearance: Normal appearance. She is normal weight.  ?Neurological:  ?   General: No focal deficit present.  ?   Mental Status: She is alert and oriented to person, place, and time.  ?Psychiatric:     ?   Attention and Perception: Attention and perception normal.     ?   Mood and Affect: Mood and affect normal.     ?   Speech: Speech normal.     ?   Behavior: Behavior normal. Behavior is cooperative.     ?   Thought Content: Thought content normal.     ?    Cognition and Memory: Memory normal. Cognition is impaired.     ?   Judgment: Judgment normal.  ? ?Review of Systems  ?Constitutional: Negative.   ?HENT: Negative.    ?Eyes: Negative.   ?Respiratory: Negative.    ?Cardiovascular: Negative.   ?Gastrointestinal: Negative.   ?Genitourinary: Negative.   ?Musculoskeletal: Negative.   ?Skin: Negative.   ?Neurological: Negative.   ?Endo/Heme/Allergies: Negative.   ?Psychiatric/Behavioral: Negative.    ?Blood pressure (!) 163/94, pulse 80, temperature 98.1 ?F (36.7 ?C), resp. rate 18, height '5\' 1"'$  (1.549 m), weight 79.8 kg, SpO2 95 %. Body mass index is 33.25 kg/m?. ? ?Mental Status Per Nursing Assessment::   ?On Admission:  NA ? ?Demographic Factors:  ?NA ? ?Loss Factors: ?NA ? ?Historical Factors: ?NA ? ?Risk Reduction Factors:   ?NA ? ?Continued Clinical Symptoms:  ?Schizophrenia:   Paranoid or undifferentiated type ? ?Cognitive Features That Contribute To Risk:  ?None   ? ?Suicide Risk:  ?Minimal: No identifiable suicidal ideation.  Patients presenting with no risk factors but with morbid ruminations; may be classified as minimal risk based on the severity of the depressive symptoms ? ? ? ?Plan Of Care/Follow-up recommendations: [facility] ? ? ?[place]ks Ranger, DO ?07/07/2021, 10:12 AM ?

## 2021-07-07 NOTE — Plan of Care (Signed)
?  Problem: Education: ?Goal: Utilization of techniques to improve thought processes will improve ?07/07/2021 1417 by Nolon Bussing, RN ?Outcome: Adequate for Discharge ?07/07/2021 1058 by Nolon Bussing, RN ?Outcome: Not Progressing ?Goal: Knowledge of the prescribed therapeutic regimen will improve ?07/07/2021 1417 by Nolon Bussing, RN ?Outcome: Adequate for Discharge ?07/07/2021 1058 by Nolon Bussing, RN ?Outcome: Not Progressing ?  ?Problem: Activity: ?Goal: Interest or engagement in leisure activities will improve ?07/07/2021 1417 by Nolon Bussing, RN ?Outcome: Adequate for Discharge ?07/07/2021 1058 by Nolon Bussing, RN ?Outcome: Not Progressing ?Goal: Imbalance in normal sleep/wake cycle will improve ?07/07/2021 1417 by Nolon Bussing, RN ?Outcome: Adequate for Discharge ?07/07/2021 1058 by Nolon Bussing, RN ?Outcome: Progressing ?  ?Problem: Coping: ?Goal: Coping ability will improve ?07/07/2021 1417 by Nolon Bussing, RN ?Outcome: Adequate for Discharge ?07/07/2021 1058 by Nolon Bussing, RN ?Outcome: Progressing ?Goal: Will verbalize feelings ?07/07/2021 1417 by Nolon Bussing, RN ?Outcome: Adequate for Discharge ?07/07/2021 1058 by Nolon Bussing, RN ?Outcome: Progressing ?  ?Problem: Health Behavior/Discharge Planning: ?Goal: Ability to make decisions will improve ?07/07/2021 1417 by Nolon Bussing, RN ?Outcome: Adequate for Discharge ?07/07/2021 1058 by Nolon Bussing, RN ?Outcome: Not Progressing ?Goal: Compliance with therapeutic regimen will improve ?07/07/2021 1417 by Nolon Bussing, RN ?Outcome: Adequate for Discharge ?07/07/2021 1058 by Nolon Bussing, RN ?Outcome: Progressing ?  ?Problem: Role Relationship: ?Goal: Will demonstrate positive changes in social behaviors and relationships ?07/07/2021 1417 by Nolon Bussing, RN ?Outcome: Adequate for Discharge ?07/07/2021 1058 by Nolon Bussing, RN ?Outcome: Not Progressing ?  ?Problem:  Safety: ?Goal: Ability to disclose and discuss suicidal ideas will improve ?07/07/2021 1417 by Nolon Bussing, RN ?Outcome: Adequate for Discharge ?07/07/2021 1058 by Nolon Bussing, RN ?Outcome: Progressing ?Goal: Ability to identify and utilize support systems that promote safety will improve ?07/07/2021 1417 by Nolon Bussing, RN ?Outcome: Adequate for Discharge ?07/07/2021 1058 by Nolon Bussing, RN ?Outcome: Not Progressing ?  ?Problem: Self-Concept: ?Goal: Will verbalize positive feelings about self ?07/07/2021 1417 by Nolon Bussing, RN ?Outcome: Adequate for Discharge ?07/07/2021 1058 by Nolon Bussing, RN ?Outcome: Not Progressing ?Goal: Level of anxiety will decrease ?07/07/2021 1417 by Nolon Bussing, RN ?Outcome: Adequate for Discharge ?07/07/2021 1058 by Nolon Bussing, RN ?Outcome: Progressing ?  ?

## 2021-07-07 NOTE — BHH Counselor (Signed)
CSW spoke with Sandra Charles, [phone], after receiving verbal permission from pt regarding discharge planning.  She stated that she was confused about who was providing transportation for pt and CSW explained that who was providing transportation was up to the family. Per conversation yesterday, pt's son [name], stated he would discuss with family regarding transportation. She also stated concerns about pt being ready for discharge. CSW explained that CSW had talked with pt's son regarding discharge and voiced no concerns.  ? ?She then proceeded to talk with the provider who relayed to pt's niece that pt was stabilized, psychiatrically cleared and wanted to leave.  ? ?She stated she would pick pt up around 2pm.  ? ?No other requests were made. Niece ended the conversation abruptly. ? ?Breahna Boylen [place], MSW, LCSW-A ?5/12/20232:30 PM  ?

## 2021-07-07 NOTE — Discharge Summary (Signed)
Physician Discharge Summary Note ? ?Patient:  Sandra Charles is an 66 y.o., female ?MRN:  [id number] ?DOB:  [birth year] ?Patient phone:  [phone] (home)  ?Patient address:   ?[address] ?[place] [address],  ?Total Time spent with patient: 1 hour ? ?Date of Admission:  07/02/2021 ?Date of Discharge: 07/07/2021 ? ?Reason for Admission:  66 yo female with a history of schizoaffective disorder, bipolar type.  Her niece brought her to the hospital after she stopped taking her medications and became paranoid.  She was irritable on assessment, not aggressive.  Refused many of the questions and told this provider, "It's in the chart, read it."   Based on the family concerns and her irritability, admission needed for geriatric psych for stabilization. ?  ?Sandra Charles is a 66 y.o. female with a past medical history of HTN, HDL, GERD, depression, chronic pain, anxiety, arthritis and seizure disorder as well as some baseline memory difficulties who presents to the emergency room after she states her niece Sandra Charles told her to come to get checked out for some nausea and vomiting she has had a last couple days.  Patient states she does not feel nauseous at present and is only here because her niece made her come.  She denies any abdominal pain, fevers, chest pain, cough, shortness of breath, urinary symptoms, diarrhea, constipation and states she last had a bowel movement earlier today that was normal.  After discussion with her niece it seems she has had some difficulty taking her medications but has been noted to eat and drink over the last couple weeks but that seem to be having more difficulty with medications of the last couple days.  ? ?Principal Problem: Schizoaffective disorder, bipolar type ([place]) ?Discharge Diagnoses: Principal Problem: ?  Schizoaffective disorder, bipolar type ([place]) ? ? ?Past Psychiatric History:  Reportedly she was diagnosed with schizoaffective disorder bipolar type.  Previous admission at  [facility] in 2017.  The patient reports one hospitalization at [facility] several years ago. According to the family she should be taking Seroquel and Celexa. In reviewing her chart and noticed that she has been prescribed Celexa, Xanax, Depakote, and Lamictal from [place] facilities. In addition to Xanax narcotic painkillers also on her list. The patient denies ever attempting suicide. ? ?Past Medical History:  ?Past Medical History:  ?Diagnosis Date  ? Anxiety   ? Arthritis   ? "all over"  ? Chronic lower back pain   ? Depression   ? GERD (gastroesophageal reflux disease)   ? Headache   ? "weekly" (07/15/2015)  ? Hyperlipidemia   ? Hypertension   ? Mini stroke   ? "several since 05/2014" (07/15/2015)  ? Seizures ([place]) dx'd 04/2015  ?  ?Past Surgical History:  ?Procedure Laterality Date  ? DILATION AND CURETTAGE OF UTERUS    ? LUMBAR PUNCTURE  07/14/2015  ? ORIF ANKLE FRACTURE Right 01/20/2021  ? Procedure: OPEN REDUCTION INTERNAL FIXATION (ORIF) ANKLE FRACTURE;  Surgeon: Corky Mull, MD;  Location: [facility] ORS;  Service: Orthopedics;  Laterality: Right;  ? TUBAL LIGATION    ? VAGINAL HYSTERECTOMY    ? ?Family History:  ?Family History  ?Problem Relation Age of Onset  ? Hyperlipidemia Mother   ? Breast cancer Mother 75  ? Hypertension Mother   ? Heart attack Mother   ?     age 38's  ? Hyperlipidemia Father   ? Hypertension Father   ? Heart attack Father 33  ? Breast cancer Sister 44  ? ?  Family Psychiatric  History: Unremarkable ?Social History:  ?Social History  ? ?Substance and Sexual Activity  ?Alcohol Use Not Currently  ? Comment: 07/15/2015 "nothing in the last few years"  ?   ?Social History  ? ?Substance and Sexual Activity  ?Drug Use No  ?  ?Social History  ? ?Socioeconomic History  ? Marital status: Single  ?  Spouse name: Not on file  ? Number of children: 1  ? Years of education: Not on file  ? Highest education level: Not on file  ?Occupational History  ? Occupation: disabled  ?Tobacco Use  ? Smoking status:  Former  ?  Packs/day: 1.00  ?  Years: 5.00  ?  Pack years: 5.00  ?  Types: Cigarettes  ? Smokeless tobacco: Never  ? Tobacco comments:  ?  "quit smoking cigarettes in the early 2000s"  ?Substance and Sexual Activity  ? Alcohol use: Not Currently  ?  Comment: 07/15/2015 "nothing in the last few years"  ? Drug use: No  ? Sexual activity: Not Currently  ?Other Topics Concern  ? Not on file  ?Social History Narrative  ? Lives with significant other, has son who comes by daily.  ? ?Social Determinants of Health  ? ?Financial Resource Strain: Not on file  ?Food Insecurity: Not on file  ?Transportation Needs: Not on file  ?Physical Activity: Not on file  ?Stress: Not on file  ?Social Connections: Not on file  ? ? ?Hospital Course: Sandra Charles was admitted under routine orders and precautions on a voluntary basis.  She apparently had not been taking her medications at home.  Her medicines were restarted along with a low-dose of Depakote in the emergency room and throughout her hospitalization.  Her Seroquel was changed from XR to regular due to feeling tired in the morning.  She was pleasant and cooperative and was compliant with her medication.  She remained isolated to her room and did somatically complain of not feeling well but was unable to tell us in what way.  She slept well and she did eat.  She no longer said that she was hearing voices and she denied suicidal ideation.  It was felt that she maximized hospitalization and she was discharged home.  On the day of discharge she denied suicidal ideation, homicidal ideation, auditory or visual hallucinations.  Her judgment and insight were fair. ? ?Physical Findings: ?AIMS:  , ,  ,  ,    ?CIWA:    ?COWS:    ? ?Musculoskeletal: ?Strength & Muscle Tone: within normal limits ?Gait & Station: normal ?Patient leans: N/A ? ? ?Psychiatric Specialty Exam: ? ?Presentation  ?General Appearance: Appropriate for Environment ? ?Eye Contact:Good ? ?Speech:Clear and Coherent ? ?Speech  Volume:Normal ? ?Handedness:Right ? ? ?Mood and Affect  ?Mood:Euthymic ? ?Affect:Appropriate ? ? ?Thought Process  ?Thought Processes:Coherent ? ?Descriptions of Associations:Intact ? ?Orientation:Full (Time, Place and Person) ? ?Thought Content:WDL ? ?History of Schizophrenia/Schizoaffective disorder:Yes ? ?Duration of Psychotic Symptoms:Greater than six months ? ?Hallucinations:No data recorded ?Ideas of Reference:None ? ?Suicidal Thoughts:No data recorded ?Homicidal Thoughts:No data recorded ? ?Sensorium  ?Memory:Immediate Fair ? ?Judgment:Good ? ?Insight:Fair ? ? ?Executive Functions  ?Concentration:Fair ? ?Attention Span:Good ? ?Recall:Fair ? ?[place] ? ?Language:Fair ? ? ?Psychomotor Activity  ?Psychomotor Activity:No data recorded ? ?Assets  ?Assets:Communication Skills ? ? ?Sleep  ?Sleep:No data recorded ? ? ?Physical Exam: ?Physical Exam ?Vitals and nursing note reviewed.  ?Constitutional:   ?   Appearance: Normal appearance. She is normal weight.  ?  Neurological:  ?   General: No focal deficit present.  ?   Mental Status: She is alert and oriented to person, place, and time.  ?Psychiatric:     ?   Attention and Perception: Attention and perception normal.     ?   Mood and Affect: Mood normal. Affect is labile.     ?   Speech: Speech is delayed.     ?   Behavior: Behavior is slowed. Behavior is cooperative.     ?   Thought Content: Thought content normal.     ?   Cognition and Memory: Memory normal. Cognition is impaired.     ?   Judgment: Judgment normal.  ? ?Review of Systems  ?Constitutional: Negative.   ?HENT: Negative.    ?Eyes: Negative.   ?Respiratory: Negative.    ?Cardiovascular: Negative.   ?Gastrointestinal: Negative.   ?Genitourinary: Negative.   ?Musculoskeletal: Negative.   ?Skin: Negative.   ?Neurological: Negative.   ?Endo/Heme/Allergies: Negative.   ?Psychiatric/Behavioral: Negative.    ?Blood pressure (!) 163/94, pulse 80, temperature 98.1 ?F (36.7 ?C), resp. rate 18, height  '5\' 1"'$  (1.549 m), weight 79.8 kg, SpO2 95 %. Body mass index is 33.25 kg/m?. ? ? ?Social History  ? ?Tobacco Use  ?Smoking Status Former  ? Packs/day: 1.00  ? Years: 5.00  ? Pack years: 5.00  ? Types: Cigarettes  ?

## 2021-07-07 NOTE — [facility] Group Notes (Addendum)
?  Date: 07/07/2021 ?  ?Time: 1:40pm  ?  ?Location: [facility]  ?  ?Behavioral response: N/A ?  ?Intervention Topic: Stress Management  ?  ?Discussion/Intervention: ?Patient refused to attend group.  ?  ?Clinical Observations/Feedback:  ?Patient refused to attend group.  ?  ?Isyss Espinal LRT/CTRS ?  ?

## 2021-10-08 ENCOUNTER — Emergency Department: Payer: Medicare Other

## 2021-10-08 ENCOUNTER — Emergency Department
Admission: EM | Admit: 2021-10-08 | Discharge: 2021-10-09 | Disposition: A | Discharge status: Home / Self Care | Payer: Medicare Other | Attending: Emergency Medicine | Admitting: Emergency Medicine

## 2021-10-08 ENCOUNTER — Other Ambulatory Visit: Payer: Self-pay

## 2021-10-08 DIAGNOSIS — F0390 Unspecified dementia without behavioral disturbance: Secondary | ICD-10-CM | POA: Insufficient documentation

## 2021-10-08 DIAGNOSIS — I10 Essential (primary) hypertension: Secondary | ICD-10-CM | POA: Diagnosis not present

## 2021-10-08 DIAGNOSIS — R569 Unspecified convulsions: Secondary | ICD-10-CM

## 2021-10-08 LAB — CBC
HCT: 42.6 % (ref 36.0–46.0)
Hemoglobin: 13.7 g/dL (ref 12.0–15.0)
MCH: 27.5 pg (ref 26.0–34.0)
MCHC: 32.2 g/dL (ref 30.0–36.0)
MCV: 85.4 fL (ref 80.0–100.0)
Platelets: 241 10*3/uL (ref 150–400)
RBC: 4.99 MIL/uL (ref 3.87–5.11)
RDW: 14.5 % (ref 11.5–15.5)
WBC: 6.7 10*3/uL (ref 4.0–10.5)
nRBC: 0 % (ref 0.0–0.2)

## 2021-10-08 LAB — BASIC METABOLIC PANEL
Anion gap: 11 (ref 5–15)
BUN: 16 mg/dL (ref 8–23)
CO2: 26 mmol/L (ref 22–32)
Calcium: 9.5 mg/dL (ref 8.9–10.3)
Chloride: 103 mmol/L (ref 98–111)
Creatinine, Ser: 1.09 mg/dL — ABNORMAL HIGH (ref 0.44–1.00)
GFR, Estimated: 57 mL/min — ABNORMAL LOW (ref >60)
Glucose, Bld: 85 mg/dL (ref 70–99)
Potassium: 3 mmol/L — ABNORMAL LOW (ref 3.5–5.1)
Sodium: 140 mmol/L (ref 135–145)

## 2021-10-08 NOTE — Discharge Instructions (Addendum)
Please follow-up with your neurologist regarding your likely seizure today.  Your work-up in the emergency department has shown reassuring results.  Return to the emergency department for any further seizure activity or any other symptom personally concerning to yourself.

## 2021-10-08 NOTE — ED Triage Notes (Signed)
First nurse note- Pt to ED from home via [facility] with c/o seizure activity at home. Husband found pt walking down road after seizure, which husband reports is a typical activity after her seizures. Hx of seizures and dementia. Unable to obtain baseline mental status from husband. EMS reports Intermittent confusion. CBG 76

## 2021-10-08 NOTE — ED Provider Notes (Signed)
   [facility] Provider Note    Event Date/Time   First MD Initiated Contact with Patient 10/08/21 2345     (approximate)  History   Chief Complaint: possible seizure  HPI  Sandra Charles is a 66 y.o. female with a past medical history of seizure disorder, hypertension, hyperlipidemia, anxiety, presents to the emergency department after a possible seizure.  According to report patient is coming from home via EMS where she had reported seizure activity.  Husband found the patient outside walking around.  Per report this is typical following her seizures she also has a history of mild dementia.  Here the patient is awake alert she appears well, no distress.  Denies any headache, no tongue or oral injury.  Physical Exam   Triage Vital Signs: ED Triage Vitals  Enc Vitals Group     BP 10/08/21 2115 109/71     Pulse Rate 10/08/21 2115 76     Resp 10/08/21 2115 16     Temp 10/08/21 2118 98 F (36.7 C)     Temp Source 10/08/21 2115 Oral     SpO2 10/08/21 2115 99 %     Weight 10/08/21 2115 150 lb (68 kg)     Height 10/08/21 2115 '5\' 1"'$  (1.549 m)     Head Circumference --      Peak Flow --      Pain Score 10/08/21 2115 0     Pain Loc --      Pain Edu? --      Excl. in [place]? --     Most recent vital signs: Vitals:   10/08/21 2115 10/08/21 2118  BP: 109/71   Pulse: 76   Resp: 16   Temp:  98 F (36.7 C)  SpO2: 99%     General: Awake, no distress.  Calm cooperative pleasant.  No oral injury or tongue laceration. CV:  Good peripheral perfusion.  Regular rate and rhythm  Resp:  Normal effort.  Equal breath sounds bilaterally.  Abd:  No distention.  Soft, nontender.  No rebound or guarding.    ED Results / Procedures / Treatments   RADIOLOGY  I reviewed and interpreted the CT head images.  There is no large bleed on my evaluation. Radiology is read the CT scan is negative for acute abnormality.   MEDICATIONS ORDERED IN ED: Medications - No data to  display   IMPRESSION / MDM / [place] / ED COURSE  I reviewed the triage vital signs and the nursing notes.  Patient's presentation is most consistent with acute presentation with potential threat to life or bodily function.  Patient presents emergency department for possible seizure this evening.  Per report husband states patient sometimes wander outside following a seizure she also has a history of mild dementia.  Patient is not able to provide a good history but denies any complaints.  No pain no headache.  Patient's lab work is reassuring with a normal CBC including a normal white blood cell count.  Normal chemistry.  Normal CT scan.  As the patient appears well with reassuring vitals reassuring physical exam and a reassuring medical work-up I believe the patient would be safe for discharge home.  Patient agreeable to plan of care.  FINAL CLINICAL IMPRESSION(S) / ED DIAGNOSES   Possible seizure   Note:  This document was prepared using Dragon voice recognition software and may include unintentional dictation errors.   Harvest Dark, MD 10/09/21 [phone]

## 2021-10-08 NOTE — ED Triage Notes (Addendum)
Pt states she may have had a seizure tonight. Pt does not remember incident. Pt states history of seizure. Pt states she has had etoh tonight. Pt is currently alert and oriented x4, per ems pt was found by family wandering down road which is behavior she exhibits post seizure. Pt denies injury, denies shob, denies pain.

## 2021-10-09 ENCOUNTER — Ambulatory Visit: Admit: 2021-10-09 | Discharge: 2021-10-09 | Payer: BLUE CROSS/BLUE SHIELD | Attending: Family | Primary: Family

## 2021-10-09 DIAGNOSIS — Z7689 Persons encountering health services in other specified circumstances: Secondary | ICD-10-CM

## 2021-10-09 MED ORDER — EPINEPHRINE 0.3 MG/0.3ML IJ SOAJ
0.3 MG/ML | Freq: Once | INTRAMUSCULAR | 0 refills | Status: AC
Start: 2021-10-09 — End: 2021-10-09

## 2021-10-09 MED ORDER — NAPROXEN 500 MG PO TABS
500 MG | ORAL_TABLET | Freq: Two times a day (BID) | ORAL | 0 refills | Status: AC
Start: 2021-10-09 — End: ?

## 2021-10-09 NOTE — Patient Instructions (Addendum)
It was a pleasure seeing you today.    Please have labs drawn at [facility] as soon as possible.     Return to clinic in 1-2 months  to discuss results and Health Maintenance.     Thank you for choosing [facility].    Paula Kaufmann, APRN - NP  Patient Education        Eating Healthy Foods: Care Instructions  Your Care Instructions     Eating healthy foods can help lower your risk for disease. Healthy food gives you energy and keeps your heart strong, your brain active, your muscles working, and your bones strong.  A healthy diet includes a variety of foods from the basic food groups: grains, vegetables, fruits, milk and milk products, and meat and beans. Some people may eat more of their favorite foods from only one food group and, as a result, miss getting the nutrients they need. So, it is important to pay attention not only to what you eat but also to what you are missing from your diet. You can eat a healthy, balanced diet by making a few small changes.  Follow-up care is a key part of your treatment and safety. Be sure to make and go to all appointments, and call your doctor if you are having problems. It's also a good idea to know your test results and keep a list of the medicines you take.  How can you care for yourself at home?  Look at what you eat  Keep a food diary for a week or two and record everything you eat or drink. Track the number of servings you eat from each food group.  For a balanced diet every day, eat a variety of:  6 or more ounce-equivalents of grains, such as cereals, breads, crackers, rice, or pasta, every day. An ounce-equivalent is 1 slice of bread, 1 cup of ready-to-eat cereal, or  cup of cooked rice, cooked pasta, or cooked cereal.  2 cups of vegetables, especially:  Dark-green vegetables such as broccoli and spinach.  Orange vegetables such as carrots and sweet potatoes.  Dry beans (such as pinto and kidney beans) and peas (such as lentils).  2 cups  of fresh, frozen, or canned fruit. A small apple or 1 banana or orange equals 1 cup.  3 cups of nonfat or low-fat milk, yogurt, or other milk products.  5 ounces of meat and beans, such as chicken, fish, lean meat, beans, nuts, and seeds. One egg, 1 tablespoon of peanut butter,  ounce nuts or seeds, or  cup of cooked beans equals 1 ounce of meat.  Learn how to read food labels for serving sizes and ingredients. Fast-food and convenience-food meals often contain few or no fruits or vegetables. Make sure you eat some fruits and vegetables to make the meal more nutritious.  Look at your food diary. For each food group, add up what you have eaten and then divide the total by the number of days. This will give you an idea of how much you are eating from each food group. See if you can find some ways to change your diet to make it more healthy.  Start small  Do not try to make dramatic changes to your diet all at once. You might feel that you are missing out on your favorite foods and then be more likely to fail.  Start slowly, and gradually change your habits. Try some of the following:  Use whole wheat  bread instead of white bread.  Use nonfat or low-fat milk instead of whole milk.  Eat brown rice instead of white rice, and eat whole wheat pasta instead of white-flour pasta.  Try low-fat cheeses and low-fat yogurt.  Add more fruits and vegetables to meals and have them for snacks.  Add lettuce, tomato, cucumber, and onion to sandwiches.  Add fruit to yogurt and cereal.  Enjoy food  You can still eat your favorite foods. You just may need to eat less of them. If your favorite foods are high in fat, salt, and sugar, limit how often you eat them, but do not cut them out entirely.  Eat a wide variety of foods.  Make healthy choices when eating out  The type of restaurant you choose can help you make healthy choices. Even fast-food chains are now offering more low-fat or healthier choices on the menu.  Choose smaller  portions, or take half of your meal home.  When eating out, try:  A veggie pizza with a whole wheat crust or grilled chicken (instead of sausage or pepperoni).  Pasta with roasted vegetables, grilled chicken, or marinara sauce instead of cream sauce.  A vegetable wrap or grilled chicken wrap.  Broiled or poached food instead of fried or breaded items.  Make healthy choices easy  Buy packaged, prewashed, ready-to-eat fresh vegetables and fruits, such as baby carrots, salad mixes, and chopped or shredded broccoli and cauliflower.  Buy packaged, presliced fruits, such as melon or pineapple.  Choose 100% fruit or vegetable juice instead of soda. Limit juice intake to 4 to 6 oz ( to  cup) a day.  Blend low-fat yogurt, fruit juice, and canned or frozen fruit to make a smoothie for breakfast or a snack.  Where can you learn more?  Go to [employer] and enter T756 to learn more about "Eating Healthy Foods: Care Instructions."  Current as of: April 26, 2021               Content Version: 13.7   2006-2023 [employer].   Care instructions adapted under license by [facility]. If you have questions about a medical condition or this instruction, always ask your healthcare professional. [employer], Incorporated disclaims any warranty or liability for your use of this information.         Patient Education        Walking for Exercise: Care Instructions  Overview     Walking is one of the easiest ways to get the exercise you need for good health. A brisk, 30-minute walk each day can help you feel better and have more energy. It can help you lower your risk of disease. Walking can help you keep your bones strong and your heart healthy.  Check with your doctor before you start a walking plan if you have heart problems, other health issues, or you have not been active in a long time. Follow your doctor's instructions for safe levels of exercise.  Follow-up care is a key part of your treatment  and safety. Be sure to make and go to all appointments, and call your doctor if you are having problems. It's also a good idea to know your test results and keep a list of the medicines you take.  How can you care for yourself at home?  Getting started  Start slowly and set a short-term goal. For example, walk for 5 or 10 minutes every day.  Bit by bit, increase the amount you walk every day.  Try for at least 30 minutes on most days of the week. You also may want to swim, bike, or do other activities.  If finding enough time is a problem, it's fine to be active in shorter periods of time throughout your day.  To get the heart-healthy benefits of walking, you need to walk briskly enough to increase your heart rate and breathing, but not so fast that you can't talk comfortably.  Wear comfortable shoes that fit well and provide good support for your feet and ankles.  Staying with your plan  After you've made walking a habit, set a longer-term goal. You may want to set a goal of walking briskly for longer or walking farther. Experts say to do 2 hours (150 minutes) of moderate activity a week. A faster heartbeat is what defines moderate-level activity.  To stay motivated, walk with friends, coworkers, or pets.  Use a phone app, pedometer, or wearable device to track your steps each day. Set a goal to increase your steps. When you reach that goal, set a higher goal.  If the weather keeps you from walking outside, go for walks at the mall with a friend. Local schools and churches may have indoor gyms where you can walk.  Fitting a walk into your workday  Park several blocks away from work, or get off the bus a few stops early.  Use the stairs instead of the elevator, at least for a few floors.  Suggest holding meetings with colleagues during a walk inside or outside the building.  Use the restroom that is the farthest from your desk or workstation.  Use your morning and afternoon breaks to take quick 15 minutes  walks.  Staying safe  Know your surroundings. Walk in a well-lighted, safe place. If it's dark, walk with a partner. Wear light-colored clothing. If you can, buy a vest or jacket that reflects light.  Carry a cell phone for emergencies.  Drink plenty of water. Take a water bottle with you when you walk. This is very important if it is hot out.  Be careful not to slip on wet or icy ground. You can buy "grippers" for your shoes to help keep you from slipping.  Pay attention to your walking surface. Use sidewalks and paths.  If you have health issues such as asthma, COPD, or heart problems, or if you haven't been active for a long time, check with your doctor before you start a new activity.  Where can you learn more?  Go to [employer] and enter R159 to learn more about "Walking for Exercise: Care Instructions."  Current as of: December 05, 2020               Content Version: 13.7   2006-2023 [employer].   Care instructions adapted under license by [facility]. If you have questions about a medical condition or this instruction, always ask your healthcare professional. [employer] disclaims any warranty or liability for your use of this information.

## 2021-10-09 NOTE — Progress Notes (Unsigned)
Chief Complaint   Patient presents with    Establish Care     Former PCP Dr Shea Evans 2 years ago    Leg Swelling     Diagnosed with lymphedema in both legs

## 2021-10-11 LAB — CBC/DIFF AMBIGUOUS DEFAULT
Basophils %: 1 %
Basophils Absolute: 0 10*3/uL (ref 0.0–0.2)
Eosinophils %: 9 %
Eosinophils Absolute: 0.4 10*3/uL (ref 0.0–0.4)
Hematocrit: 41 % (ref 34.0–46.6)
Hemoglobin: 13.5 g/dL (ref 11.1–15.9)
Immature Grans (Abs): 0 10*3/uL (ref 0.0–0.1)
Immature Granulocytes: 0 %
Lymphocytes %: 41 %
Lymphocytes Absolute: 1.9 10*3/uL (ref 0.7–3.1)
MCH: 30.2 pg (ref 26.6–33.0)
MCHC: 32.9 g/dL (ref 31.5–35.7)
MCV: 92 fL (ref 79–97)
Monocytes %: 8 %
Monocytes Absolute: 0.4 10*3/uL (ref 0.1–0.9)
Neutrophils %: 41 %
Neutrophils Absolute: 2 10*3/uL (ref 1.4–7.0)
Platelets: 248 10*3/uL (ref 150–450)
RBC: 4.47 x10E6/uL (ref 3.77–5.28)
RDW: 12.6 % (ref 11.7–15.4)
WBC: 4.8 10*3/uL (ref 3.4–10.8)

## 2021-10-11 LAB — COMPREHENSIVE METABOLIC PANEL
ALT: 11 IU/L (ref 0–32)
AST: 14 IU/L (ref 0–40)
Albumin/Globulin Ratio: 1.8 (ref 1.2–2.2)
Albumin: 4.4 g/dL (ref 3.9–4.9)
Alkaline Phosphatase: 72 IU/L (ref 44–121)
BUN/Creatinine Ratio: 13 (ref 12–28)
BUN: 10 mg/dL (ref 8–27)
CO2: 22 mmol/L (ref 20–29)
Calcium: 9.5 mg/dL (ref 8.7–10.3)
Chloride: 101 mmol/L (ref 96–106)
Creatinine: 0.78 mg/dL (ref 0.57–1.00)
Est, Glomerular Filtration Rate: 85 mL/min/{1.73_m2} (ref >59)
Globulin, Total: 2.5 g/dL (ref 1.5–4.5)
Glucose: 97 mg/dL (ref 70–99)
Potassium: 3.9 mmol/L (ref 3.5–5.2)
Sodium: 137 mmol/L (ref 134–144)
Total Bilirubin: 0.7 mg/dL (ref 0.0–1.2)
Total Protein: 6.9 g/dL (ref 6.0–8.5)

## 2021-10-11 LAB — URINALYSIS W/ RFLX MICROSCOPIC
Bilirubin Urine: NEGATIVE
Blood, Urine: NEGATIVE
Glucose, Ur: NEGATIVE
Ketones, Urine: NEGATIVE
Leukocyte Esterase, Urine: NEGATIVE
Nitrite, Urine: NEGATIVE
Protein, UA: NEGATIVE
Specific Gravity, UA: 1.01 (ref 1.005–1.030)
Urobilinogen, Urine: 0.2 mg/dL (ref 0.2–1.0)
pH, UA: 7.5 (ref 5.0–7.5)

## 2021-10-11 LAB — CARDIOVASCULAR REPORT

## 2021-10-11 LAB — IRON AND TIBC
Iron Saturation: 31 % (ref 15–55)
Iron: 97 ug/dL (ref 27–139)
TIBC: 313 ug/dL (ref 250–450)
UIBC: 216 ug/dL (ref 118–369)

## 2021-10-11 LAB — LIPID PANEL
Cholesterol: 265 mg/dL — ABNORMAL HIGH (ref 100–199)
HDL: 62 mg/dL (ref >39)
LDL Calculated: 180 mg/dL — ABNORMAL HIGH (ref 0–99)
Triglycerides: 130 mg/dL (ref 0–149)
VLDL Cholesterol Calculated: 23 mg/dL (ref 5–40)

## 2021-10-11 LAB — HEPATITIS C AB, RFLX TO QT BY PCR: Hepatitis C Ab: NONREACTIVE

## 2021-10-11 LAB — VITAMIN B12 & FOLATE
Folate: 7.4 ng/mL (ref >3.0)
Vitamin B-12: 265 pg/mL (ref 232–1245)

## 2021-10-11 LAB — VITAMIN D 25 HYDROXY: Vit D, 25-Hydroxy: 36.4 ng/mL (ref 30.0–100.0)

## 2021-10-11 LAB — HEMOGLOBIN A1C: Hemoglobin A1C: 5.2 % (ref 4.8–5.6)

## 2021-10-11 LAB — TSH: TSH: 2.55 u[IU]/mL (ref 0.450–4.500)

## 2021-10-11 LAB — HIV 1/2 AG/AB, 4TH GENERATION,W RFLX CONFIRM: HIV Screen 4th Generation wRfx: NONREACTIVE

## 2021-10-11 LAB — SPECIMEN STATUS REPORT

## 2021-10-11 LAB — FERRITIN: Ferritin: 60 ng/mL (ref 15–150)

## 2021-10-11 LAB — INTERPRETATION

## 2021-10-16 NOTE — Progress Notes (Signed)
Paula Winters (DOB: [birth year]) is a 66 y.o. female is here for evaluation of the following chief complaint(s): Establish Care (Former PCP Dr Shea Evans 2 years ago) and Leg Swelling (Diagnosed with lymphedema in both legs)        Subjective/Objective:   Alfonso was seen today for Establish Care (Former PCP Dr Shea Evans 2 years ago) and Leg Swelling (Diagnosed with lymphedema in both legs)  Pt denies any other complaints except leg swelling. Pt counseled about using compression hose. Last saw PCP Dr Shea Evans 2 years ago. Routine labs ordered. Added iron studies because pt reports hx of iron excess. Pt hx of allergies needing to go to ER. Epipen out of date. Refilled.     Review of Systems   Constitutional: Negative.    HENT: Negative.     Eyes: Negative.    Respiratory: Negative.     Cardiovascular:  Positive for leg swelling.   Gastrointestinal: Negative.    Endocrine: Negative.    Genitourinary: Negative.    Musculoskeletal: Negative.    Skin: Negative.    Allergic/Immunologic: Negative.    Neurological: Negative.    Hematological: Negative.    Psychiatric/Behavioral: Negative.          Physical Exam  Vitals reviewed.   Constitutional:       Appearance: Normal appearance. She is not ill-appearing, toxic-appearing or diaphoretic.   HENT:      Head: Normocephalic and atraumatic.      Right Ear: Tympanic membrane, ear canal and external ear normal.      Left Ear: Tympanic membrane, ear canal and external ear normal.      Ears:      Comments: Pt denies difficulty hearing.      Nose: Nose normal.      Mouth/Throat:      Lips: Pink.      Mouth: Mucous membranes are moist.      Pharynx: Oropharynx is clear.      Comments: Denies any acute dental pain.   Eyes:      General: Lids are normal.      Extraocular Movements: Extraocular movements intact.      Conjunctiva/sclera: Conjunctivae normal.      Comments: Denies any acute vision changes.    Neck:      Thyroid: No thyroid mass, thyromegaly or thyroid tenderness.      Vascular: No carotid  bruit or JVD.      Trachea: Trachea and phonation normal.   Cardiovascular:      Rate and Rhythm: Normal rate and regular rhythm.      Pulses: Normal pulses.           Radial pulses are 2+ on the right side.      Heart sounds: Normal heart sounds, S1 normal and S2 normal.      Comments: Pt counseled and discussed compression hose.   Pulmonary:      Effort: Pulmonary effort is normal.      Breath sounds: Normal breath sounds and air entry.   Abdominal:      General: Abdomen is flat. There is no distension.      Palpations: Abdomen is soft. There is no hepatomegaly or splenomegaly.      Tenderness: There is no abdominal tenderness. There is no right CVA tenderness or left CVA tenderness.      Hernia: No hernia is present.   Musculoskeletal:         General: Normal range of motion.  Cervical back: Full passive range of motion without pain and normal range of motion. No spinous process tenderness or muscular tenderness.      Right lower leg: 2+ Edema present.      Left lower leg: 2+ Edema present.   Lymphadenopathy:      Cervical: No cervical adenopathy.   Skin:     General: Skin is warm and dry.      Capillary Refill: Capillary refill takes less than 2 seconds.   Neurological:      General: No focal deficit present.      Mental Status: She is alert and oriented to person, place, and time.   Psychiatric:         Mood and Affect: Mood normal.         Behavior: Behavior normal. Behavior is cooperative.         Thought Content: Thought content normal.         Judgment: Judgment normal.        BP (!) 156/83 (Site: Left Upper Arm, Position: Sitting, Cuff Size: Large Adult)   Pulse 64   Temp 98.6 F (37 C) (Temporal)   Resp 20   Ht 5\' 4"  (1.626 m)   Wt 180 lb 3.2 oz (81.7 kg)   SpO2 97%   BMI 30.93 kg/m            Past Surgical History:   Procedure Laterality Date    GASTRIC BYPASS SURGERY          Past Medical History:   Diagnosis Date    Allergies     Asthma     Lymph edema     Migraines         Current  Outpatient Medications   Medication Instructions    albuterol sulfate HFA (PROVENTIL;VENTOLIN;PROAIR) 108 (90 Base) MCG/ACT inhaler 2 puffs, Inhalation    Cholecalciferol 1.25 MG (50000 UT) TABS 1 tablet, Oral, WEEKLY    Cyanocobalamin (NASCOBAL) 500 MCG/0.1ML SOLN nasal spray 1 spray, Nasal    EPINEPHrine (EPIPEN 2-PAK) 0.3 mg, IntraMUSCular, ONCE, Use as directed for allergic reaction    EPINEPHrine (EPIPEN) 0.3 mg, IntraMUSCular, ONCE PRN    naproxen (NAPROSYN) 500 mg, Oral, 2 TIMES DAILY WITH MEALS    rizatriptan (MAXALT) 10 mg, Oral, ONCE PRN        Assessment/Plan:     The above diagnosis is a chronic problem.  We discussed expected course, resolution, and complications of diagnosis in detail.  I advised her to call back or return to office if symptoms worsen/change/persist.       Diagnosis Orders   1. Encounter to establish care with new doctor  Urinalysis    Comprehensive Metabolic Panel      2. Allergy, subsequent encounter        3. Uncomplicated asthma, unspecified asthma severity, unspecified whether persistent        4. Other migraine without status migrainosus, not intractable        5. Lymph edema        6. Iron excess  CBC with Auto Differential    Iron and TIBC    Vitamin B12 & Folate    Ferritin      7. BMI 30.0-30.9,adult  Lipid Panel    Hemoglobin A1C    TSH      8. Screen for STD (sexually transmitted disease)  Hepatitis C Antibody    HIV1/2 p24Ag/Ab Screen      9. Vitamin D deficiency  Vitamin  D 25 Hydroxy         Return in about 4 weeks (around 11/06/2021).      An electronic signature was used to authenticate this note.  -- Georga Kaufmann, APRN - NP

## 2021-11-09 ENCOUNTER — Ambulatory Visit: Admit: 2021-11-09 | Discharge: 2021-11-09 | Payer: BLUE CROSS/BLUE SHIELD | Attending: Family | Primary: Family

## 2021-11-09 DIAGNOSIS — Z6830 Body mass index (BMI) 30.0-30.9, adult: Secondary | ICD-10-CM

## 2021-11-09 MED ORDER — RIZATRIPTAN BENZOATE 10 MG PO TABS
10 MG | ORAL_TABLET | Freq: Once | ORAL | 0 refills | Status: AC | PRN
Start: 2021-11-09 — End: 2021-11-09

## 2021-11-09 MED ORDER — ALBUTEROL SULFATE HFA 108 (90 BASE) MCG/ACT IN AERS
108 (90 Base) MCG/ACT | RESPIRATORY_TRACT | 2 refills | Status: AC | PRN
Start: 2021-11-09 — End: 2022-02-07

## 2021-11-09 MED ORDER — HYDROCHLOROTHIAZIDE 12.5 MG PO CAPS
12.5 MG | ORAL_CAPSULE | Freq: Every morning | ORAL | 1 refills | Status: AC
Start: 2021-11-09 — End: ?

## 2021-11-09 NOTE — Progress Notes (Signed)
Chief Complaint   Patient presents with    Discuss Labs     Follow up for lab results

## 2021-11-09 NOTE — Patient Instructions (Addendum)
It was a pleasure to see you today.     Check your electrolytes in 4-6 weeks.    Have cholesterol checked just prior to next visit.     Return in 4 months. Sooner for any new or worsening symptoms.    Georga Kaufmann, APRN - NP  Patient Education        Eating Healthy Foods: Care Instructions  With every meal, you can make healthy food choices. Try to eat a variety of fruits, vegetables, whole grains, lean proteins, and low-fat dairy products. This can help you get the right balance of nutrients, including vitamins and minerals. Small changes add up over time. You can start by adding one healthy food to your meals each day.    Try to make half your plate fruits and vegetables, one-fourth whole grains, and one-fourth lean proteins. Try including dairy with your meals.   Eat more fruits and vegetables. Try to have them with most meals and snacks.   Foods for healthy eating    Fruits    These can be fresh, frozen, canned, or dried.  Try to choose whole fruit rather than fruit juice.  Eat a variety of colors.    Vegetables    These can be fresh, frozen, canned, or dried.  Beans, peas, and lentils count too.    Whole grains    Choose whole-grain breads, cereals, and noodles.  Try brown rice.    Lean proteins    These can include lean meat, poultry, fish, and eggs.  You can also have tofu, beans, peas, lentils, nuts, and seeds.    Dairy    Try milk, yogurt, and cheese.  Choose low-fat or fat-free when you can.  If you need to, use lactose-free milk or fortified plant-based milk products, such as soy milk.    Water    Drink water when you're thirsty.  Limit sugar-sweetened drinks, including soda, fruit drinks, and sports drinks.  Where can you learn more?  Go to [employer] and enter T756 to learn more about "Eating Healthy Foods: Care Instructions."  Current as of: April 25, 2021               Content Version: 13.8   2006-2023 [employer].   Care instructions adapted under  license by [facility]. If you have questions about a medical condition or this instruction, always ask your healthcare professional. [employer], [employer] disclaims any warranty or liability for your use of this information.         Patient Education        Walking for Exercise: Care Instructions  Overview     Walking is one of the easiest ways to get the exercise you need for good health. A brisk, 30-minute walk each day can help you feel better and have more energy. It can help you lower your risk of disease. Walking can help you keep your bones strong and your heart healthy.  Check with your doctor before you start a walking plan if you have heart problems, other health issues, or you have not been active in a long time. Follow your doctor's instructions for safe levels of exercise.  Follow-up care is a key part of your treatment and safety. Be sure to make and go to all appointments, and call your doctor if you are having problems. It's also a good idea to know your test results and keep a list of the medicines you take.  How can you care for yourself at home?  Getting started  Start slowly and set a short-term goal. For example, walk for 5 or 10 minutes every day.  Bit by bit, increase the amount you walk every day. Try for at least 30 minutes on most days of the week. You also may want to swim, bike, or do other activities.  If finding enough time is a problem, it's fine to be active in shorter periods of time throughout your day.  To get the heart-healthy benefits of walking, you need to walk briskly enough to increase your heart rate and breathing, but not so fast that you can't talk comfortably.  Wear comfortable shoes that fit well and provide good support for your feet and ankles.  Staying with your plan  After you've made walking a habit, set a longer-term goal. You may want to set a goal of walking briskly for longer or walking farther. Experts say to do 2 hours (150 minutes) of moderate activity  a week. A faster heartbeat is what defines moderate-level activity.  To stay motivated, walk with friends, coworkers, or pets.  Use a phone app, pedometer, or wearable device to track your steps each day. Set a goal to increase your steps. When you reach that goal, set a higher goal.  If the weather keeps you from walking outside, go for walks at the mall with a friend. Local schools and churches may have indoor gyms where you can walk.  Fitting a walk into your workday  Park several blocks away from work, or get off the bus a few stops early.  Use the stairs instead of the elevator, at least for a few floors.  Suggest holding meetings with colleagues during a walk inside or outside the building.  Use the restroom that is the farthest from your desk or workstation.  Use your morning and afternoon breaks to take quick 15 minutes walks.  Staying safe  Know your surroundings. Walk in a well-lighted, safe place. If it's dark, walk with a partner. Wear light-colored clothing. If you can, buy a vest or jacket that reflects light.  Carry a cell phone for emergencies.  Drink plenty of water. Take a water bottle with you when you walk. This is very important if it is hot out.  Be careful not to slip on wet or icy ground. You can buy "grippers" for your shoes to help keep you from slipping.  Pay attention to your walking surface. Use sidewalks and paths.  If you have health issues such as asthma, COPD, or heart problems, or if you haven't been active for a long time, check with your doctor before you start a new activity.  Where can you learn more?  Go to https://www.bennett.info/ and enter R159 to learn more about "Walking for Exercise: Care Instructions."  Current as of: August 01, 2021               Content Version: 13.8   2006-2023 [employer].   Care instructions adapted under license by [facility]. If you have questions about a medical condition or this instruction, always ask your healthcare  professional. [employer] any warranty or liability for your use of this information.

## 2021-11-10 MED ORDER — NAPROXEN 500 MG PO TABS
500 MG | ORAL_TABLET | Freq: Two times a day (BID) | ORAL | 0 refills | Status: AC
Start: 2021-11-10 — End: ?

## 2021-11-17 ENCOUNTER — Encounter: Payer: Self-pay | Admitting: *Deleted

## 2021-11-20 ENCOUNTER — Encounter: Payer: Self-pay | Admitting: *Deleted

## 2021-11-20 ENCOUNTER — Ambulatory Visit: Payer: Medicare Other | Admitting: Certified Registered Nurse Anesthetist

## 2021-11-20 ENCOUNTER — Ambulatory Visit
Admission: RE | Admit: 2021-11-20 | Discharge: 2021-11-20 | Disposition: A | Discharge status: Home / Self Care | Payer: Medicare Other | Attending: Gastroenterology | Admitting: Gastroenterology

## 2021-11-20 ENCOUNTER — Encounter
Admission: RE | Disposition: A | Discharge status: Home / Self Care | Payer: Self-pay | Source: Home / Self Care | Attending: Gastroenterology

## 2021-11-20 ENCOUNTER — Other Ambulatory Visit: Payer: Self-pay

## 2021-11-20 DIAGNOSIS — Z87891 Personal history of nicotine dependence: Secondary | ICD-10-CM | POA: Insufficient documentation

## 2021-11-20 DIAGNOSIS — K31819 Angiodysplasia of stomach and duodenum without bleeding: Secondary | ICD-10-CM | POA: Insufficient documentation

## 2021-11-20 DIAGNOSIS — K3189 Other diseases of stomach and duodenum: Secondary | ICD-10-CM | POA: Diagnosis not present

## 2021-11-20 DIAGNOSIS — D123 Benign neoplasm of transverse colon: Secondary | ICD-10-CM | POA: Insufficient documentation

## 2021-11-20 DIAGNOSIS — D509 Iron deficiency anemia, unspecified: Secondary | ICD-10-CM | POA: Diagnosis present

## 2021-11-20 DIAGNOSIS — I10 Essential (primary) hypertension: Secondary | ICD-10-CM | POA: Diagnosis not present

## 2021-11-20 DIAGNOSIS — D124 Benign neoplasm of descending colon: Secondary | ICD-10-CM | POA: Diagnosis not present

## 2021-11-20 DIAGNOSIS — F0390 Unspecified dementia without behavioral disturbance: Secondary | ICD-10-CM | POA: Diagnosis not present

## 2021-11-20 DIAGNOSIS — K219 Gastro-esophageal reflux disease without esophagitis: Secondary | ICD-10-CM | POA: Insufficient documentation

## 2021-11-20 DIAGNOSIS — D120 Benign neoplasm of cecum: Secondary | ICD-10-CM | POA: Insufficient documentation

## 2021-11-20 DIAGNOSIS — D122 Benign neoplasm of ascending colon: Secondary | ICD-10-CM | POA: Insufficient documentation

## 2021-11-20 DIAGNOSIS — M1990 Unspecified osteoarthritis, unspecified site: Secondary | ICD-10-CM | POA: Diagnosis not present

## 2021-11-20 DIAGNOSIS — F250 Schizoaffective disorder, bipolar type: Secondary | ICD-10-CM | POA: Diagnosis not present

## 2021-11-20 HISTORY — PX: COLONOSCOPY WITH PROPOFOL: SHX5780

## 2021-11-20 HISTORY — DX: Unspecified dementia, unspecified severity, without behavioral disturbance, psychotic disturbance, mood disturbance, and anxiety: F03.90

## 2021-11-20 HISTORY — DX: Dementia in other diseases classified elsewhere, unspecified severity, without behavioral disturbance, psychotic disturbance, mood disturbance, and anxiety: F02.80

## 2021-11-20 HISTORY — PX: ESOPHAGOGASTRODUODENOSCOPY (EGD) WITH PROPOFOL: SHX5813

## 2021-11-20 SURGERY — COLONOSCOPY WITH PROPOFOL
Anesthesia: General

## 2021-11-20 MED ORDER — GLYCOPYRROLATE 0.2 MG/ML IJ SOLN
INTRAMUSCULAR | Status: DC | PRN
  Administered 2021-11-20: .2 mg via INTRAVENOUS

## 2021-11-20 MED ORDER — PHENYLEPHRINE HCL (PRESSORS) 10 MG/ML IV SOLN
INTRAVENOUS | Status: DC | PRN
  Administered 2021-11-20: 80 ug via INTRAVENOUS

## 2021-11-20 MED ORDER — PROPOFOL 10 MG/ML IV BOLUS
INTRAVENOUS | Status: DC | PRN
  Administered 2021-11-20: 30 mg via INTRAVENOUS
  Administered 2021-11-20: 60 mg via INTRAVENOUS

## 2021-11-20 MED ORDER — PROPOFOL 500 MG/50ML IV EMUL
INTRAVENOUS | Status: DC | PRN
  Administered 2021-11-20: 130 ug/kg/min via INTRAVENOUS

## 2021-11-20 MED ORDER — LIDOCAINE HCL (CARDIAC) PF 100 MG/5ML IV SOSY
PREFILLED_SYRINGE | INTRAVENOUS | Status: DC | PRN
  Administered 2021-11-20: 100 mg via INTRAVENOUS

## 2021-11-20 MED ORDER — SODIUM CHLORIDE 0.9 % IV SOLN: INTRAVENOUS | Status: DC

## 2021-11-20 NOTE — Anesthesia Procedure Notes (Signed)
Date/Time: 11/20/2021 1:09 PM  Performed by: Demetrius Charity, CRNAPre-anesthesia Checklist: Patient identified, Emergency Drugs available, Suction available, Patient being monitored and Timeout performed Patient Re-evaluated:Patient Re-evaluated prior to induction Oxygen Delivery Method: Nasal cannula Induction Type: IV induction Placement Confirmation: CO2 detector and positive ETCO2

## 2021-11-20 NOTE — Op Note (Signed)
[facility] Gastroenterology Patient Name: Sandra Charles Procedure Date: 11/20/2021 1:02 PM MRN: [id number] Account #: [id number] Date of Birth: 03-26-1955 Admit Type: Outpatient Age: 66 Room: [facility] ENDO ROOM 3 Gender: Female Note Status: Finalized Instrument Name: Upper Endoscope [id number] Procedure:             Upper GI endoscopy Indications:           Iron deficiency anemia Providers:             Andrey Farmer MD, MD Referring MD:          No Local Md, MD (Referring MD) Medicines:             Monitored Anesthesia Care Complications:         No immediate complications. Estimated blood loss:                         Minimal. Procedure:             Pre-Anesthesia Assessment:                        - Prior to the procedure, a History and Physical was                         performed, and patient medications and allergies were                         reviewed. The patient is competent. The risks and                         benefits of the procedure and the sedation options and                         risks were discussed with the patient. All questions                         were answered and informed consent was obtained.                         Patient identification and proposed procedure were                         verified by the physician, the nurse, the                         anesthesiologist, the anesthetist and the technician                         in the endoscopy suite. Mental Status Examination:                         alert and oriented. Airway Examination: normal                         oropharyngeal airway and neck mobility. Respiratory                         Examination: clear to auscultation. CV Examination:  normal. Prophylactic Antibiotics: The patient does not                         require prophylactic antibiotics. Prior                         Anticoagulants: The patient has taken no previous                          anticoagulant or antiplatelet agents. ASA Grade                         Assessment: III - A patient with severe systemic                         disease. After reviewing the risks and benefits, the                         patient was deemed in satisfactory condition to                         undergo the procedure. The anesthesia plan was to use                         monitored anesthesia care (MAC). Immediately prior to                         administration of medications, the patient was                         re-assessed for adequacy to receive sedatives. The                         heart rate, respiratory rate, oxygen saturations,                         blood pressure, adequacy of pulmonary ventilation, and                         response to care were monitored throughout the                         procedure. The physical status of the patient was                         re-assessed after the procedure.                        After obtaining informed consent, the endoscope was                         passed under direct vision. Throughout the procedure,                         the patient's blood pressure, pulse, and oxygen                         saturations were monitored continuously. The Endoscope  was introduced through the mouth, and advanced to the                         second part of duodenum. The upper GI endoscopy was                         accomplished without difficulty. The patient tolerated                         the procedure well. Findings:      The examined esophagus was normal.      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 Biopsies were taken with a cold forceps for histology.       Estimated blood loss was minimal.      The examined duodenum was normal. Impression:            - Normal esophagus.                        - Gastric antral vascular ectasia without bleeding.                          Biopsied.                        - Normal examined duodenum. Recommendation:        - Discharge patient to home.                        - Resume previous diet.                        - Continue present medications.                        - Await pathology results.                        - Return to referring physician as previously                         scheduled. Procedure Code(s):     --- Professional ---                        [phone], Esophagogastroduodenoscopy, flexible,                         transoral; with biopsy, single or multiple Diagnosis Code(s):     --- Professional ---                        K31.819, Angiodysplasia of stomach and duodenum                         without bleeding                        D50.9, Iron deficiency anemia, unspecified CPT copyright 2019 American Medical Association. All rights reserved. The codes documented in this report are preliminary and upon coder review may  be revised to meet current compliance requirements. Andrey Farmer  MD, MD 11/20/2021 1:46:19 PM Number of Addenda: 0 Note Initiated On: 11/20/2021 1:02 PM Estimated Blood Loss:  Estimated blood loss was minimal.      [facility]

## 2021-11-20 NOTE — Transfer of Care (Signed)
Immediate Anesthesia Transfer of Care Note  Patient: Sandra Charles  Procedure(s) Performed: COLONOSCOPY WITH PROPOFOL ESOPHAGOGASTRODUODENOSCOPY (EGD) WITH PROPOFOL  Patient Location: PACU  Anesthesia Type:General  Level of Consciousness: drowsy  Airway & Oxygen Therapy: Patient Spontanous Breathing  Post-op Assessment: Report given to RN and Post -op Vital signs reviewed and stable  Post vital signs: Reviewed and stable  Last Vitals:  Vitals Value Taken Time  BP    Temp    Pulse    Resp    SpO2      Last Pain:  Vitals:   11/20/21 1232  TempSrc: Temporal  PainSc: 0-No pain         Complications: No notable events documented.

## 2021-11-20 NOTE — Op Note (Signed)
[facility] Patient Name: Sandra Charles Procedure Date: 11/20/2021 1:01 PM MRN: [id number] Account #: [id number] Date of Birth: 03-23-1955 Admit Type: Outpatient Age: 66 Room: [facility] ENDO ROOM 3 Gender: Female Note Status: Finalized Instrument Name: Jasper Riling [id number] Procedure:             Colonoscopy Indications:           Iron deficiency anemia Providers:             Andrey Farmer MD, MD Referring MD:          No Local Md, MD (Referring MD) Medicines:             Monitored Anesthesia Care Complications:         No immediate complications. Estimated blood loss:                         Minimal. Procedure:             Pre-Anesthesia Assessment:                        - Prior to the procedure, a History and Physical was                         performed, and patient medications and allergies were                         reviewed. The patient is competent. The risks and                         benefits of the procedure and the sedation options and                         risks were discussed with the patient. All questions                         were answered and informed consent was obtained.                         Patient identification and proposed procedure were                         verified by the physician, the nurse, the                         anesthesiologist, the anesthetist and the technician                         in the endoscopy suite. Mental Status Examination:                         alert and oriented. Airway Examination: normal                         oropharyngeal airway and neck mobility. Respiratory                         Examination: clear to auscultation. CV Examination:  normal. Prophylactic Antibiotics: The patient does not                         require prophylactic antibiotics. Prior                         Anticoagulants: The patient has taken no previous                          anticoagulant or antiplatelet agents. ASA Grade                         Assessment: III - A patient with severe systemic                         disease. After reviewing the risks and benefits, the                         patient was deemed in satisfactory condition to                         undergo the procedure. The anesthesia plan was to use                         monitored anesthesia care (MAC). Immediately prior to                         administration of medications, the patient was                         re-assessed for adequacy to receive sedatives. The                         heart rate, respiratory rate, oxygen saturations,                         blood pressure, adequacy of pulmonary ventilation, and                         response to care were monitored throughout the                         procedure. The physical status of the patient was                         re-assessed after the procedure.                        After obtaining informed consent, the colonoscope was                         passed under direct vision. Throughout the procedure,                         the patient's blood pressure, pulse, and oxygen                         saturations were monitored continuously. The  Colonoscope was introduced through the anus and                         advanced to the the cecum, identified by appendiceal                         orifice and ileocecal valve. The colonoscopy was                         performed without difficulty. The patient tolerated                         the procedure well. The quality of the bowel                         preparation was good. Findings:      The perianal and digital rectal examinations were normal.      Five sessile polyps were found in the ascending colon and cecum. The       polyps were 2 to 5 mm in size. These polyps were removed with a cold       snare. Resection and retrieval were complete. Estimated  blood loss was       minimal.      Two sessile polyps were found in the transverse colon. The polyps were 3       to 4 mm in size. These polyps were removed with a cold snare. Resection       and retrieval were complete. Estimated blood loss was minimal.      A 8 mm polyp was found in the descending colon. The polyp was sessile.       The polyp was removed with a cold snare. Resection and retrieval were       complete. Estimated blood loss was minimal.      The exam was otherwise without abnormality on direct and retroflexion       views. Impression:            - Five 2 to 5 mm polyps in the ascending colon and in                         the cecum, removed with a cold snare. Resected and                         retrieved.                        - Two 3 to 4 mm polyps in the transverse colon,                         removed with a cold snare. Resected and retrieved.                        - One 8 mm polyp in the descending colon, removed with                         a cold snare. Resected and retrieved.                        - The examination was otherwise  normal on direct and                         retroflexion views. Recommendation:        - Discharge patient to home.                        - Resume previous diet.                        - Continue present medications.                        - Await pathology results.                        - Repeat colonoscopy for surveillance based on                         pathology results.                        - Return to referring physician as previously                         scheduled. Procedure Code(s):     --- Professional ---                        [phone], Colonoscopy, flexible; with removal of                         tumor(s), polyp(s), or other lesion(s) by snare                         technique Diagnosis Code(s):     --- Professional ---                        K63.5, Polyp of colon                        D50.9, Iron deficiency  anemia, unspecified CPT copyright 2019 [employer] Medical [employer]. All rights reserved. The codes documented in this report are preliminary and upon coder review may  be revised to meet current compliance requirements. Andrey Farmer MD, MD 11/20/2021 1:51:25 PM Number of Addenda: 0 Note Initiated On: 11/20/2021 1:01 PM Scope Withdrawal Time: 0 hours 10 minutes 37 seconds  Total Procedure Duration: 0 hours 15 minutes 10 seconds  Estimated Blood Loss:  Estimated blood loss was minimal.      [facility]

## 2021-11-20 NOTE — Interval H&P Note (Signed)
History and Physical Interval Note:  11/20/2021 1:04 PM  Sandra Charles  has presented today for surgery, with the diagnosis of IDA.GERD.  The various methods of treatment have been discussed with the patient and family. After consideration of risks, benefits and other options for treatment, the patient has consented to  Procedure(s): COLONOSCOPY WITH PROPOFOL (N/A) ESOPHAGOGASTRODUODENOSCOPY (EGD) WITH PROPOFOL (N/A) as a surgical intervention.  The patient's history has been reviewed, patient examined, no change in status, stable for surgery.  I have reviewed the patient's chart and labs.  Questions were answered to the patient's satisfaction.     Lesly Rubenstein  Ok to proceed with EGD/Colonoscopy

## 2021-11-20 NOTE — Anesthesia Preprocedure Evaluation (Signed)
Anesthesia Evaluation  Patient identified by MRN, date of birth, ID band Patient awake    Reviewed: Allergy & Precautions, H&P , NPO status , Patient's Chart, lab work & pertinent test results  History of Anesthesia Complications Negative for: history of anesthetic complications  Airway Mallampati: III       Dental  (+) Poor Dentition, Missing, Dental Advidsory Given,    Pulmonary shortness of breath and with exertion, neg sleep apnea, neg COPD, neg recent URI, Patient did not abstain from smoking., former smoker,  Has history of intubation in the past in the setting of aspiration pneumonia related to seizure   Pulmonary exam normal        Cardiovascular Exercise Tolerance: Poor hypertension, Pt. on medications (-) angina(-) Past MI and (-) Cardiac Stents Normal cardiovascular exam(-) dysrhythmias (-) Valvular Problems/Murmurs     Neuro/Psych  Headaches, Seizures - (eizure disorder with recurrent breakthrough seizures, hospitalized in July/22), Poorly Controlled,  PSYCHIATRIC DISORDERS Anxiety Depression Bipolar Disorder Dementia Schizoaffective disorderTIA (2016)   GI/Hepatic GERD  ,(+)     substance abuse  marijuana use,   Endo/Other  negative endocrine ROS  Renal/GU negative Renal ROS  negative genitourinary   Musculoskeletal  (+) Arthritis ,   Abdominal (+) + obese,   Peds negative pediatric ROS (+)  Hematology negative hematology ROS (+)   Anesthesia Other Findings Pt with ankle fx after a mechanical fall down 3-4 stairs. Denies prelude of seizure activity.  CT HEAD: IMPRESSION: 1. No acute brain injury. 2. Minimal chronic ischemic microvascular disease. 3. No acute cervical spine injury. 4. Mild to moderate spondylosis of the cervical spine with disc disease at the C6-7 level. Mild multilevel neural foraminal narrowing as described.  EKG: NSR  CXR: No acute findings     Reproductive/Obstetrics negative OB ROS                             Anesthesia Physical  Anesthesia Plan  ASA: 3  Anesthesia Plan: General   Post-op Pain Management:    Induction: Intravenous  PONV Risk Score and Plan: Propofol infusion, TIVA and Treatment may vary due to age or medical condition  Airway Management Planned: Natural Airway and Nasal Cannula  Additional Equipment:   Intra-op Plan:   Post-operative Plan:   Informed Consent: I have reviewed the patients History and Physical, chart, labs and discussed the procedure including the risks, benefits and alternatives for the proposed anesthesia with the patient or authorized representative who has indicated his/her understanding and acceptance.     Consent reviewed with POA and Dental advisory given  Plan Discussed with: Anesthesiologist and CRNA  Anesthesia Plan Comments: (Discussed with Son, Lajune Perine)        Anesthesia Quick Evaluation

## 2021-11-20 NOTE — H&P (Signed)
Outpatient short stay form Pre-procedure 11/20/2021  Sandra Rubenstein, MD  Primary Physician: Ashley Jacobs, MD  Reason for visit:  IDA  History of present illness:    66 y/o lady with dementia, hypertension, and hld here for EGD/Colonoscopy for IDA. No blood thinners. No family history of GI malignancies. No significant abdominal surgeries.    Current Facility-Administered Medications:    0.9 %  sodium chloride infusion, , Intravenous, Continuous, Hosie Sharman, Hilton Cork, MD  Medications Prior to Admission  Medication Sig Dispense Refill Last Dose   amLODipine (NORVASC) 10 MG tablet Take 10 mg by mouth daily. For high blood pressure   11/20/2021 at 0800   cloBAZam (ONFI) 10 MG tablet Take 0.5 tablets (5 mg total) by mouth at bedtime. 30 tablet 1 11/19/2021   cloNIDine (CATAPRES) 0.1 MG tablet Take 1 tablet (0.1 mg total) by mouth 2 (two) times daily. 60 tablet 11 11/20/2021 at 0800   divalproex (DEPAKOTE) 250 MG DR tablet Take 1 tablet (250 mg total) by mouth every 12 (twelve) hours. 60 tablet 3 11/20/2021 at 0800   donepezil (ARICEPT) 10 MG tablet Take 10 mg by mouth at bedtime.   11/19/2021   lamoTRIgine (LAMICTAL) 100 MG tablet 100 mg in morning and 125 mg in evening. 60 tablet 0 11/20/2021 at 0800   pantoprazole (PROTONIX) 40 MG tablet Take 1 tablet (40 mg total) by mouth daily. 30 tablet 11 11/20/2021 at 0800   propranolol ER (INDERAL LA) 60 MG 24 hr capsule Take 60 mg by mouth daily.  11 11/20/2021 at 0800   QUEtiapine (SEROQUEL) 50 MG tablet Take 1 tablet (50 mg total) by mouth at bedtime. 30 tablet 3 11/19/2021   rosuvastatin (CRESTOR) 20 MG tablet Take 20 mg by mouth at bedtime.   11/20/2021   sertraline (ZOLOFT) 100 MG tablet Take 100 mg by mouth daily.  2 11/20/2021   aspirin EC 81 MG tablet Take 81 mg by mouth daily. Swallow whole.      lamoTRIgine (LAMICTAL) 25 MG tablet Take 1 tablet (25 mg total) by mouth every evening. Along with 100 mg tablet for total of 125 mg in evening.  (Patient taking differently: Take 125 mg by mouth 2 (two) times daily. Along with 100 mg tablet for total of 125 mg in evening.) 60 tablet 0    OZEMPIC, 0.25 OR 0.5 MG/DOSE, 2 MG/1.5ML SOPN Inject 0.25 mg into the skin once a week. (Patient not taking: Reported on 05/01/2021)      OZEMPIC, 1 MG/DOSE, 4 MG/3ML SOPN Inject into the skin. Pt taking on fridays   11/10/2021   traZODone (DESYREL) 100 MG tablet Take 1 tablet (100 mg total) by mouth at bedtime as needed for sleep. 30 tablet 0    vitamin B-12 (CYANOCOBALAMIN) 1000 MCG tablet Take 1,000 mcg by mouth daily.        Allergies  Allergen Reactions   Lovastatin Other (See Comments)    Unknown reaction     Past Medical History:  Diagnosis Date   Anxiety    Arthritis    "all over"   Chronic lower back pain    Dementia (HCC)    Depression    GERD (gastroesophageal reflux disease)    Headache    "weekly" (07/15/2015)   Hyperlipidemia    Hypertension    Mini stroke    "several since 05/2014" (07/15/2015)   Seizures ([place]) dx'd 04/2015    Review of systems:  Otherwise negative.    Physical Exam  Gen: Alert, oriented. Appears stated age.  HEENT: PERRLA. Lungs: No respiratory distress CV: RRR Abd: soft, benign, no masses Ext: No edema    Planned procedures: Proceed with EGD/colonoscopy. The patient understands the nature of the planned procedure, indications, risks, alternatives and potential complications including but not limited to bleeding, infection, perforation, damage to internal organs and possible oversedation/side effects from anesthesia. The patient agrees and gives consent to proceed.  Please refer to procedure notes for findings, recommendations and patient disposition/instructions.     Sandra Rubenstein, MD [facility]

## 2021-11-21 ENCOUNTER — Encounter: Payer: Self-pay | Admitting: Gastroenterology

## 2021-11-21 LAB — SURGICAL PATHOLOGY

## 2021-11-23 NOTE — Anesthesia Postprocedure Evaluation (Signed)
Anesthesia Post Note  Patient: Sandra Charles  Procedure(s) Performed: COLONOSCOPY WITH PROPOFOL ESOPHAGOGASTRODUODENOSCOPY (EGD) WITH PROPOFOL  Patient location during evaluation: Endoscopy Anesthesia Type: General Level of consciousness: awake and alert Pain management: pain level controlled Vital Signs Assessment: post-procedure vital signs reviewed and stable Respiratory status: spontaneous breathing, nonlabored ventilation, respiratory function stable and patient connected to nasal cannula oxygen Cardiovascular status: blood pressure returned to baseline and stable Postop Assessment: no apparent nausea or vomiting Anesthetic complications: no   No notable events documented.   Last Vitals:  Vitals:   11/20/21 1347 11/20/21 1357  BP: (!) 156/78 (!) 170/88  Pulse: 72 68  Resp: 15 17  Temp:    SpO2: 100% 100%    Last Pain:  Vitals:   11/20/21 1357  TempSrc:   PainSc: 0-No pain                 Martha Clan

## 2021-12-17 NOTE — Progress Notes (Signed)
Paula Winters (DOB: 1955/03/01) is a 66 y.o. female is here for evaluation of the following chief complaint(s): Discuss Labs (Follow up for lab results)        Subjective/Objective:   Paula Winters was seen today for Discuss Labs (Follow up for lab results)    Pt here to review labs. Pt labs unremarkable except having elevated cholesterol. Pt would like to try diet and exercise to reduce cholesterol and will recheck in 4-5 months. Pt blood pressure elevated in office. Pt reports hx of blood pressure being elevated in the doctors office. Pt reports at home it was 112/70 this am. Pt complaint of mild leg swelling as well. Will prescribe HCTZ for this as well as HTN.    Review of Systems   Constitutional:  Negative for activity change, fatigue, fever and unexpected weight change.   HENT:  Negative for nosebleeds.    Eyes:  Negative for visual disturbance.   Respiratory:  Negative for cough, chest tightness, shortness of breath and wheezing.    Cardiovascular:  Positive for leg swelling. Negative for chest pain and palpitations.   Gastrointestinal:  Negative for abdominal pain, constipation, diarrhea and nausea.   Endocrine: Negative for polydipsia and polyuria.   Genitourinary:  Negative for flank pain.   Musculoskeletal:  Negative for joint swelling and myalgias.   Skin:  Negative for rash.   Neurological:  Negative for dizziness, syncope, weakness, light-headedness, numbness and headaches.   Psychiatric/Behavioral:  The patient is not nervous/anxious.           Physical Exam  Vitals reviewed.   Constitutional:       General: She is not in acute distress.     Appearance: Normal appearance.   HENT:      Head: Normocephalic.      Nose: Nose normal.      Mouth/Throat:      Mouth: Mucous membranes are moist.   Eyes:      Extraocular Movements: Extraocular movements intact.      Conjunctiva/sclera: Conjunctivae normal.      Pupils: Pupils are equal, round, and reactive to light.   Cardiovascular:      Rate and Rhythm: Normal rate  and regular rhythm.      Pulses: Normal pulses.      Heart sounds: Normal heart sounds.      Comments: Non pitting.  Pulmonary:      Effort: Pulmonary effort is normal.      Breath sounds: Normal breath sounds.   Abdominal:      General: Bowel sounds are normal.   Musculoskeletal:         General: Normal range of motion.      Cervical back: Normal range of motion.      Right lower leg: 1+ Edema present.      Left lower leg: 1+ Edema present.   Skin:     General: Skin is warm and dry.      Findings: No rash.   Neurological:      Mental Status: She is alert.   Psychiatric:         Mood and Affect: Mood normal.         Behavior: Behavior normal.          BP (!) 143/82 (Site: Left Upper Arm, Position: Sitting, Cuff Size: Large Adult)   Pulse 73   Temp 97.8 F (36.6 C) (Temporal)   Resp 20   Ht 1.626 m (5\' 4" )   Wt 82.6 kg (  182 lb)   SpO2 96%   BMI 31.24 kg/m      Office Visit on 10/09/2021   Component Date Value Ref Range Status    WBC 10/10/2021 4.8  3.4 - 10.8 x10E3/uL Final    RBC 10/10/2021 4.47  3.77 - 5.28 x10E6/uL Final    Hemoglobin 10/10/2021 13.5  11.1 - 15.9 g/dL Final    Hematocrit 54/27/0623 41.0  34.0 - 46.6 % Final    MCV 10/10/2021 92  79 - 97 fL Final    MCH 10/10/2021 30.2  26.6 - 33.0 pg Final    MCHC 10/10/2021 32.9  31.5 - 35.7 g/dL Final    RDW 76/28/3151 12.6  11.7 - 15.4 % Final    Platelets 10/10/2021 248  150 - 450 x10E3/uL Final    Neutrophils % 10/10/2021 41  Not Estab. % Final    Lymphocytes % 10/10/2021 41  Not Estab. % Final    Monocytes % 10/10/2021 8  Not Estab. % Final    Eosinophils % 10/10/2021 9  Not Estab. % Final    Basophils % 10/10/2021 1  Not Estab. % Final    Neutrophils Absolute 10/10/2021 2.0  1.4 - 7.0 x10E3/uL Final    Lymphocytes Absolute 10/10/2021 1.9  0.7 - 3.1 x10E3/uL Final    Monocytes Absolute 10/10/2021 0.4  0.1 - 0.9 x10E3/uL Final    Eosinophils Absolute 10/10/2021 0.4  0.0 - 0.4 x10E3/uL Final    Basophils Absolute 10/10/2021 0.0  0.0 - 0.2 x10E3/uL  Final    Immature Granulocytes 10/10/2021 0  Not Estab. % Final    Immature Grans (Abs) 10/10/2021 0.0  0.0 - 0.1 x10E3/uL Final    Comment: A hand-written panel/profile was received from your office. In  accordance with the [facility] Ambiguous Test Code Policy dated July  2003, we have assigned CBC with Differential/Platelet, Test Code  #[id number] to this request. If this is not the testing you wished to  receive on this specimen, please contact the [facility] Department to clarify the test order. We  appreciate your business.      Glucose 10/10/2021 97  70 - 99 mg/dL Final    BUN 76/16/0737 10  8 - 27 mg/dL Final    Creatinine 10/62/6948 0.78  0.57 - 1.00 mg/dL Final    Est, Glomerular Filtration Rate 10/10/2021 85  >59 mL/min/1.73 Final    BUN/Creatinine Ratio 10/10/2021 13  12 - 28 Final    Sodium 10/10/2021 137  134 - 144 mmol/L Final    Potassium 10/10/2021 3.9  3.5 - 5.2 mmol/L Final    Chloride 10/10/2021 101  96 - 106 mmol/L Final    CO2 10/10/2021 22  20 - 29 mmol/L Final    Calcium 10/10/2021 9.5  8.7 - 10.3 mg/dL Final    Total Protein 10/10/2021 6.9  6.0 - 8.5 g/dL Final    Albumin 54/62/7035 4.4  3.9 - 4.9 g/dL Final    Globulin, Total 10/10/2021 2.5  1.5 - 4.5 g/dL Final    Albumin/Globulin Ratio 10/10/2021 1.8  1.2 - 2.2 Final    Total Bilirubin 10/10/2021 0.7  0.0 - 1.2 mg/dL Final    Alkaline Phosphatase 10/10/2021 72  44 - 121 IU/L Final    AST 10/10/2021 14  0 - 40 IU/L Final    ALT 10/10/2021 11  0 - 32 IU/L Final    Specific Gravity, UA 10/10/2021 1.010  1.005 - 1.030 Final  pH, UA 10/10/2021 7.5  5.0 - 7.5 Final    Color, Urine 10/10/2021 Yellow  Yellow Final    Appearance 10/10/2021 Clear  Clear Final    Leukocyte Esterase, Urine 10/10/2021 Negative  Negative Final    Protein, UA 10/10/2021 Negative  Negative/Trace Final    Glucose, Ur 10/10/2021 Negative  Negative Final    Ketones, Urine 10/10/2021 Negative  Negative Final    Blood, Urine 10/10/2021 Negative   Negative Final    Bilirubin Urine 10/10/2021 Negative  Negative Final    Urobilinogen, Urine 10/10/2021 0.2  0.2 - 1.0 mg/dL Final    Nitrite, Urine 10/10/2021 Negative  Negative Final    Microscopic Examination 10/10/2021 Comment   Final    Microscopic not indicated and not performed.    Cholesterol 10/10/2021 265 (H)  100 - 199 mg/dL Final    Triglycerides 10/10/2021 130  0 - 149 mg/dL Final    HDL 10/10/2021 62  >39 mg/dL Final    VLDL Cholesterol Calculated 10/10/2021 23  5 - 40 mg/dL Final    LDL Calculated 10/10/2021 180 (H)  0 - 99 mg/dL Final    TIBC 10/10/2021 313  250 - 450 ug/dL Final    UIBC 10/10/2021 216  118 - 369 ug/dL Final    Iron 10/10/2021 97  27 - 139 ug/dL Final    Iron Saturation 10/10/2021 31  15 - 55 % Final    Vitamin B-12 10/10/2021 265  232 - 1245 pg/mL Final    Folate 10/10/2021 7.4  >3.0 ng/mL Final    Comment: A serum folate concentration of less than 3.1 ng/mL is  considered to represent clinical deficiency.      Hepatitis C Ab 10/10/2021 Non Reactive  Non Reactive Final    Interpretation 10/10/2021 Comment   Final    Comment: Not infected with HCV unless early or acute infection is  suspected (which may be delayed in an immunocompromised  individual), or other evidence exists to indicate HCV infection.      Hemoglobin A1C 10/10/2021 5.2  4.8 - 5.6 % Final    Comment:          Prediabetes: 5.7 - 6.4           Diabetes: >6.4           Glycemic control for adults with diabetes: <7.0      TSH 10/10/2021 2.550  0.450 - 4.500 uIU/mL Final    Vit D, 25-Hydroxy 10/10/2021 36.4  30.0 - 100.0 ng/mL Final    Comment: Vitamin D deficiency has been defined by the [place] practice guideline as a  level of serum 25-OH vitamin D less than 20 ng/mL (1,2).  The Endocrine Society went on to further define vitamin D  insufficiency as a level between 21 and 29 ng/mL (2).  1. IOM (Institute of Medicine). 2010. Dietary reference     intakes for calcium and D.  [place]: The     [employer].  2. Holick MF, Binkley NC, Bischoff-Ferrari HA, et al.     Evaluation, treatment, and prevention of vitamin D     deficiency: an Endocrine Society clinical practice     guideline. JCEM. 2011 Jul; 96(7):1911-30.      HIV Screen 4th Generation wRfx 10/10/2021 Non Reactive  Non Reactive Final    Comment: HIV Negative  HIV-1/HIV-2 antibodies and HIV-1 p24 antigen were NOT detected.  There is no laboratory evidence of  HIV infection.      Ferritin 10/10/2021 60  15 - 150 ng/mL Final    Interpretation 10/10/2021 Note   Final    Supplemental report is available.    Specimen Status Report 10/10/2021 COMMENT   Final    Comment: Gloris [place] CMP14 Default  Gloris [place] CMP14 Default  A hand-written panel/profile was received from your office. In  accordance with the [facility] Ambiguous Test Code Policy dated July  2003, we have completed your order by using the closest currently  or formerly recognized AMA panel.  We have assigned Comprehensive  Metabolic Panel (14), Test Code #[id number] to this request.  If this  is not the testing you wished to receive on this specimen, please  contact the [facility] Client Inquiry/Technical Services Department  to clarify the test order.  We appreciate your business.  Ambig Abbrev LP Default  Ambig Abbrev LP Default  A hand-written panel/profile was received from your office. In  accordance with the [facility] Ambiguous Test Code Policy dated July  2003, we have completed your order by using the closest currently  or formerly recognized AMA panel.  We have assigned Lipid Panel,  Test Code [phone] to this request. If this is not the testing you  wished to receive on this specimen, plea                           se contact the [facility]  Client Inquiry/Technical Services Department to clarify the test  order.  We appreciate your business.           Past Surgical History:   Procedure Laterality Date    GASTRIC BYPASS SURGERY          Past Medical History:    Diagnosis Date    Allergies     Asthma     Lymph edema     Migraines         Current Outpatient Medications   Medication Instructions    albuterol sulfate HFA (PROVENTIL;VENTOLIN;PROAIR) 108 (90 Base) MCG/ACT inhaler 2 puffs, Inhalation, EVERY 4 HOURS PRN    Cholecalciferol 1.25 MG (50000 UT) TABS 1 tablet, Oral, WEEKLY    Cyanocobalamin (NASCOBAL) 500 MCG/0.1ML SOLN nasal spray 1 spray, Nasal    EPINEPHrine (EPIPEN) 0.3 mg, IntraMUSCular, ONCE PRN    hydroCHLOROthiazide (MICROZIDE) 12.5 mg, Oral, EVERY MORNING    naproxen (NAPROSYN) 500 mg, Oral, 2 times daily with meals    rizatriptan (MAXALT) 10 mg, Oral, ONCE PRN        Assessment/Plan:     The above diagnosis is a chronic problem.  We discussed expected course, resolution, and complications of diagnosis in detail.  I advised her to call back or return to office if symptoms worsen/change/persist.       Diagnosis Orders   1. BMI 30.0-30.9,adult        2. Other hyperlipidemia  Lipid Panel      3. White coat syndrome without hypertension        4. Leg swelling  Basic Metabolic Panel      5. Colon cancer screening  [facility] - Jeral Fruit, MD, Gastroenterology, [place]         Return in about 4 months (around 03/11/2022) for cholesterol check.      An electronic signature was used to authenticate this note.  -- Georga Kaufmann, APRN - NP

## 2022-01-29 ENCOUNTER — Emergency Department: Payer: Medicare Other

## 2022-01-29 ENCOUNTER — Observation Stay (HOSPITAL_BASED_OUTPATIENT_CLINIC_OR_DEPARTMENT_OTHER)
Admission: EM | Admit: 2022-01-29 | Discharge: 2022-01-30 | Disposition: A | Discharge status: Home / Self Care | Payer: Medicare Other | Source: Home / Self Care | Attending: Emergency Medicine | Admitting: Emergency Medicine

## 2022-01-29 ENCOUNTER — Other Ambulatory Visit: Payer: Self-pay

## 2022-01-29 ENCOUNTER — Observation Stay: Payer: Medicare Other

## 2022-01-29 DIAGNOSIS — R569 Unspecified convulsions: Secondary | ICD-10-CM | POA: Insufficient documentation

## 2022-01-29 DIAGNOSIS — E785 Hyperlipidemia, unspecified: Secondary | ICD-10-CM | POA: Diagnosis present

## 2022-01-29 DIAGNOSIS — G3183 Dementia with Lewy bodies: Secondary | ICD-10-CM

## 2022-01-29 DIAGNOSIS — F0280 Dementia in other diseases classified elsewhere without behavioral disturbance: Secondary | ICD-10-CM | POA: Diagnosis present

## 2022-01-29 DIAGNOSIS — G9341 Metabolic encephalopathy: Secondary | ICD-10-CM | POA: Diagnosis present

## 2022-01-29 DIAGNOSIS — F418 Other specified anxiety disorders: Secondary | ICD-10-CM | POA: Diagnosis not present

## 2022-01-29 DIAGNOSIS — G9340 Encephalopathy, unspecified: Secondary | ICD-10-CM | POA: Diagnosis not present

## 2022-01-29 DIAGNOSIS — Z8673 Personal history of transient ischemic attack (TIA), and cerebral infarction without residual deficits: Secondary | ICD-10-CM | POA: Insufficient documentation

## 2022-01-29 DIAGNOSIS — Z7982 Long term (current) use of aspirin: Secondary | ICD-10-CM | POA: Insufficient documentation

## 2022-01-29 DIAGNOSIS — F0390 Unspecified dementia without behavioral disturbance: Secondary | ICD-10-CM | POA: Insufficient documentation

## 2022-01-29 DIAGNOSIS — D72829 Elevated white blood cell count, unspecified: Secondary | ICD-10-CM | POA: Diagnosis not present

## 2022-01-29 DIAGNOSIS — Z79899 Other long term (current) drug therapy: Secondary | ICD-10-CM | POA: Insufficient documentation

## 2022-01-29 DIAGNOSIS — Z789 Other specified health status: Secondary | ICD-10-CM

## 2022-01-29 DIAGNOSIS — Z87891 Personal history of nicotine dependence: Secondary | ICD-10-CM | POA: Insufficient documentation

## 2022-01-29 DIAGNOSIS — I10 Essential (primary) hypertension: Secondary | ICD-10-CM | POA: Insufficient documentation

## 2022-01-29 DIAGNOSIS — E663 Overweight: Secondary | ICD-10-CM | POA: Diagnosis present

## 2022-01-29 DIAGNOSIS — I639 Cerebral infarction, unspecified: Secondary | ICD-10-CM | POA: Diagnosis present

## 2022-01-29 LAB — COMPREHENSIVE METABOLIC PANEL
ALT: 24 U/L (ref 0–44)
AST: 27 U/L (ref 15–41)
Albumin: 3.9 g/dL (ref 3.5–5.0)
Alkaline Phosphatase: 121 U/L (ref 38–126)
Anion gap: 6 (ref 5–15)
BUN: 14 mg/dL (ref 8–23)
CO2: 28 mmol/L (ref 22–32)
Calcium: 9.2 mg/dL (ref 8.9–10.3)
Chloride: 102 mmol/L (ref 98–111)
Creatinine, Ser: 0.8 mg/dL (ref 0.44–1.00)
GFR, Estimated: >60 mL/min (ref >60)
Glucose, Bld: 100 mg/dL — ABNORMAL HIGH (ref 70–99)
Potassium: 4.2 mmol/L (ref 3.5–5.1)
Sodium: 136 mmol/L (ref 135–145)
Total Bilirubin: 0.7 mg/dL (ref 0.3–1.2)
Total Protein: 8 g/dL (ref 6.5–8.1)

## 2022-01-29 LAB — CBC WITH DIFFERENTIAL/PLATELET
Abs Immature Granulocytes: 0.06 10*3/uL (ref 0.00–0.07)
Basophils Absolute: 0 10*3/uL (ref 0.0–0.1)
Basophils Relative: 0 %
Eosinophils Absolute: 0 10*3/uL (ref 0.0–0.5)
Eosinophils Relative: 0 %
HCT: 41.1 % (ref 36.0–46.0)
Hemoglobin: 13.3 g/dL (ref 12.0–15.0)
Immature Granulocytes: 0 %
Lymphocytes Relative: 9 %
Lymphs Abs: 1.3 10*3/uL (ref 0.7–4.0)
MCH: 27.4 pg (ref 26.0–34.0)
MCHC: 32.4 g/dL (ref 30.0–36.0)
MCV: 84.6 fL (ref 80.0–100.0)
Monocytes Absolute: 0.6 10*3/uL (ref 0.1–1.0)
Monocytes Relative: 4 %
Neutro Abs: 12.8 10*3/uL — ABNORMAL HIGH (ref 1.7–7.7)
Neutrophils Relative %: 87 %
Platelets: 256 10*3/uL (ref 150–400)
RBC: 4.86 MIL/uL (ref 3.87–5.11)
RDW: 14.6 % (ref 11.5–15.5)
WBC: 14.8 10*3/uL — ABNORMAL HIGH (ref 4.0–10.5)
nRBC: 0 % (ref 0.0–0.2)

## 2022-01-29 LAB — VALPROIC ACID LEVEL: Valproic Acid Lvl: <10 ug/mL — ABNORMAL LOW (ref 50.0–100.0)

## 2022-01-29 LAB — MAGNESIUM: Magnesium: 2.1 mg/dL (ref 1.7–2.4)

## 2022-01-29 MED ORDER — VALPROATE SODIUM 100 MG/ML IV SOLN
1200.0000 mg | Freq: Once | INTRAVENOUS | Status: AC
  Administered 2022-01-29: 1200 mg via INTRAVENOUS
  Filled 2022-01-29: qty 12

## 2022-01-29 MED ORDER — LORAZEPAM 2 MG/ML IJ SOLN: 1.0000 mg | INTRAMUSCULAR | Status: DC | PRN

## 2022-01-29 MED ORDER — SERTRALINE HCL 50 MG PO TABS
100.0000 mg | ORAL_TABLET | Freq: Every day | ORAL | Status: DC
  Administered 2022-01-30: 100 mg via ORAL
  Filled 2022-01-29: qty 2

## 2022-01-29 MED ORDER — LORAZEPAM 2 MG/ML IJ SOLN
1.0000 mg | Freq: Once | INTRAMUSCULAR | Status: AC
  Administered 2022-01-29: 1 mg via INTRAVENOUS
  Filled 2022-01-29: qty 1

## 2022-01-29 MED ORDER — ONDANSETRON HCL 4 MG/2ML IJ SOLN: 4.0000 mg | Freq: Three times a day (TID) | INTRAMUSCULAR | Status: DC | PRN

## 2022-01-29 MED ORDER — LEVETIRACETAM IN NACL 1000 MG/100ML IV SOLN
1000.0000 mg | Freq: Two times a day (BID) | INTRAVENOUS | Status: DC
  Administered 2022-01-30: 1000 mg via INTRAVENOUS
  Filled 2022-01-29 (×2): qty 100

## 2022-01-29 MED ORDER — PANTOPRAZOLE SODIUM 40 MG PO TBEC
40.0000 mg | DELAYED_RELEASE_TABLET | Freq: Every day | ORAL | Status: DC
  Administered 2022-01-30: 40 mg via ORAL
  Filled 2022-01-29: qty 1

## 2022-01-29 MED ORDER — LORAZEPAM 2 MG/ML IJ SOLN
INTRAMUSCULAR | Status: AC
  Administered 2022-01-29: 1 mg
  Filled 2022-01-29: qty 1

## 2022-01-29 MED ORDER — PROPRANOLOL HCL ER 60 MG PO CP24
60.0000 mg | ORAL_CAPSULE | Freq: Every day | ORAL | Status: DC
  Administered 2022-01-30: 60 mg via ORAL
  Filled 2022-01-29: qty 1

## 2022-01-29 MED ORDER — ROSUVASTATIN CALCIUM 20 MG PO TABS
20.0000 mg | ORAL_TABLET | Freq: Every day | ORAL | Status: DC
  Administered 2022-01-29: 20 mg via ORAL
  Filled 2022-01-29 (×2): qty 1

## 2022-01-29 MED ORDER — AMLODIPINE BESYLATE 10 MG PO TABS
10.0000 mg | ORAL_TABLET | Freq: Every day | ORAL | Status: DC
  Administered 2022-01-30: 10 mg via ORAL
  Filled 2022-01-29: qty 1

## 2022-01-29 MED ORDER — LAMOTRIGINE 25 MG PO TABS
125.0000 mg | ORAL_TABLET | Freq: Two times a day (BID) | ORAL | Status: DC
  Administered 2022-01-29 – 2022-01-30 (×2): 125 mg via ORAL
  Filled 2022-01-29 (×2): qty 1

## 2022-01-29 MED ORDER — ENOXAPARIN SODIUM 40 MG/0.4ML IJ SOSY
40.0000 mg | PREFILLED_SYRINGE | INTRAMUSCULAR | Status: DC
  Administered 2022-01-29: 40 mg via SUBCUTANEOUS
  Filled 2022-01-29: qty 0.4

## 2022-01-29 MED ORDER — CLOBAZAM 10 MG PO TABS
5.0000 mg | ORAL_TABLET | Freq: Two times a day (BID) | ORAL | Status: DC
  Administered 2022-01-29: 5 mg via ORAL
  Filled 2022-01-29 (×2): qty 1

## 2022-01-29 MED ORDER — ACETAMINOPHEN 325 MG PO TABS: 650.0000 mg | ORAL_TABLET | Freq: Four times a day (QID) | ORAL | Status: DC | PRN

## 2022-01-29 MED ORDER — DIVALPROEX SODIUM 250 MG PO DR TAB
250.0000 mg | DELAYED_RELEASE_TABLET | Freq: Two times a day (BID) | ORAL | Status: DC
  Administered 2022-01-29: 250 mg via ORAL
  Filled 2022-01-29 (×2): qty 1

## 2022-01-29 MED ORDER — QUETIAPINE FUMARATE 25 MG PO TABS
50.0000 mg | ORAL_TABLET | Freq: Every day | ORAL | Status: DC
  Administered 2022-01-29: 50 mg via ORAL
  Filled 2022-01-29: qty 2

## 2022-01-29 MED ORDER — HYDRALAZINE HCL 20 MG/ML IJ SOLN: 5.0000 mg | INTRAMUSCULAR | Status: DC | PRN

## 2022-01-29 MED ORDER — ASPIRIN 81 MG PO TBEC
81.0000 mg | DELAYED_RELEASE_TABLET | Freq: Every day | ORAL | Status: DC
  Administered 2022-01-30: 81 mg via ORAL
  Filled 2022-01-29: qty 1

## 2022-01-29 MED ORDER — LEVETIRACETAM IN NACL 1000 MG/100ML IV SOLN
1000.0000 mg | Freq: Once | INTRAVENOUS | Status: AC
  Administered 2022-01-29: 1000 mg via INTRAVENOUS
  Filled 2022-01-29: qty 100

## 2022-01-29 MED ORDER — TRAZODONE HCL 50 MG PO TABS: 100.0000 mg | ORAL_TABLET | Freq: Every evening | ORAL | Status: DC | PRN

## 2022-01-29 MED ORDER — DONEPEZIL HCL 5 MG PO TABS
10.0000 mg | ORAL_TABLET | Freq: Every day | ORAL | Status: DC
  Administered 2022-01-29: 10 mg via ORAL
  Filled 2022-01-29: qty 2

## 2022-01-29 NOTE — ED Triage Notes (Signed)
Pt here via [facility] with c/o of having a seizure at home, pt has HX of same. Pt also has HX of LB dementia.  Pt slightly post-ictal at this time.

## 2022-01-29 NOTE — H&P (Addendum)
History and Physical    Sandra Charles UJW:[id number] DOB: 24-May-1955 DOA: 01/29/2022  Referring MD/NP/PA:   PCP: Ashley Jacobs, MD   Patient coming from:  The patient is coming from home.    Chief Complaint: seizure  HPI: Sandra Charles is a 66 y.o. female with medical history significant of seizure, Lewy body dementia, hypertension, hyperlipidemia, overweight obesity BMI 28.83, depression with anxiety, psychosis, who presents with seizure.  Patient is confused, cannot provide any medical history.  I called her son by phone.  Per her son, patient had seizure at home. She had another seizure when she was doing CT scan in the hospital per ED physician. When I saw patient in ED, patient is confused, not oriented x 3.  Not following command.  She moves all extremities.  No facial droop.  Per her son at her normal baseline, patient recognizes family members, sometimes orientated to the place, sometimes oriented to time.  Patient does not have active nausea, vomiting, diarrhea, respiratory distress, cough noted.  Per her son, patient does not seem to have chest pain or abdominal pain.  Data reviewed independently and ED Course: pt was found to have WBC 14.8, valproic acid level<10, GFR> 60, temperature normal, blood pressure 165/91, heart rate 98, RR 24, oxygen saturation 97% on room air.  Patient is placed on telemetry bed for patient, Dr. Leonel Ramsay of neurology is consulted.   CT scan of head: 1. 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 No hemorrhage. 2. Sequela of mild chronic microvascular ischemic change.  EKG: I have personally reviewed.  Sinus rhythm, QTc 433, LAE.  Nonspecific T wave change.   Review of Systems: Could not be reviewed due to altered mental status and dementia.   Allergy:  Allergies  Allergen Reactions   Lovastatin Other (See Comments)    Unknown reaction    Past Medical History:  Diagnosis Date   Anxiety    Arthritis    "all over"   Chronic  lower back pain    Dementia (HCC)    Depression    GERD (gastroesophageal reflux disease)    Headache    "weekly" (07/15/2015)   Hyperlipidemia    Hypertension    Lewy body dementia ([place])    Mini stroke    "several since 05/2014" (07/15/2015)   Psychosis in elderly, without behavioral disturbance ([place])    Seizures ([place]) dx'd 04/2015    Past Surgical History:  Procedure Laterality Date   COLONOSCOPY WITH PROPOFOL N/A 11/20/2021   Procedure: COLONOSCOPY WITH PROPOFOL;  Surgeon: Lesly Rubenstein, MD;  Location: [facility] ENDOSCOPY;  Service: Endoscopy;  Laterality: N/A;   DILATION AND CURETTAGE OF UTERUS     ESOPHAGOGASTRODUODENOSCOPY (EGD) WITH PROPOFOL N/A 11/20/2021   Procedure: ESOPHAGOGASTRODUODENOSCOPY (EGD) WITH PROPOFOL;  Surgeon: Lesly Rubenstein, MD;  Location: [facility] ENDOSCOPY;  Service: Endoscopy;  Laterality: N/A;   FRACTURE SURGERY     LUMBAR PUNCTURE  07/14/2015   ORIF ANKLE FRACTURE Right 01/20/2021   Procedure: OPEN REDUCTION INTERNAL FIXATION (ORIF) ANKLE FRACTURE;  Surgeon: Corky Mull, MD;  Location: [facility] ORS;  Service: Orthopedics;  Laterality: Right;   TUBAL LIGATION     VAGINAL HYSTERECTOMY      Social History:  reports that she has quit smoking. Her smoking use included cigarettes. She has a 5.00 pack-year smoking history. She has never used smokeless tobacco. She reports that she does not currently use alcohol. She reports that she does not use drugs.  Family History:  Family History  Problem Relation Age of Onset   Hyperlipidemia Mother    Breast cancer Mother 69   Hypertension Mother    Heart attack Mother        age 63's   Hyperlipidemia Father    Hypertension Father    Heart attack Father 28   Breast cancer Sister 45     Prior to Admission medications   Medication Sig Start Date End Date Taking? Authorizing Provider  amLODipine (NORVASC) 10 MG tablet Take 10 mg by mouth daily. For high blood pressure    [provider]  aspirin EC 81 MG  tablet Take 81 mg by mouth daily. Swallow whole.    [provider]  cloBAZam (ONFI) 10 MG tablet Take 0.5 tablets (5 mg total) by mouth at bedtime. 01/13/21   Ward, Delice Bison, DO  cloNIDine (CATAPRES) 0.1 MG tablet Take 1 tablet (0.1 mg total) by mouth 2 (two) times daily. 07/15/15   Loletha Grayer, MD  divalproex (DEPAKOTE) 250 MG DR tablet Take 1 tablet (250 mg total) by mouth every 12 (twelve) hours. 07/07/21   Parks Ranger, DO  donepezil (ARICEPT) 10 MG tablet Take 10 mg by mouth at bedtime.    [provider]  lamoTRIgine (LAMICTAL) 100 MG tablet 100 mg in morning and 125 mg in evening. 08/31/20   Lavina Hamman, MD  lamoTRIgine (LAMICTAL) 25 MG tablet Take 1 tablet (25 mg total) by mouth every evening. Along with 100 mg tablet for total of 125 mg in evening. Patient taking differently: Take 125 mg by mouth 2 (two) times daily. Along with 100 mg tablet for total of 125 mg in evening. 08/31/20   Lavina Hamman, MD  OZEMPIC, 0.25 OR 0.5 MG/DOSE, 2 MG/1.5ML SOPN Inject 0.25 mg into the skin once a week. Patient not taking: Reported on 05/01/2021 01/18/21   [provider]  OZEMPIC, 1 MG/DOSE, 4 MG/3ML SOPN Inject into the skin. Pt taking on fridays 04/28/21   [provider]  pantoprazole (PROTONIX) 40 MG tablet Take 1 tablet (40 mg total) by mouth daily. 11/25/14   Wellington Hampshire, MD  propranolol ER (INDERAL LA) 60 MG 24 hr capsule Take 60 mg by mouth daily. 09/15/17   [provider]  QUEtiapine (SEROQUEL) 50 MG tablet Take 1 tablet (50 mg total) by mouth at bedtime. 07/07/21   Parks Ranger, DO  rosuvastatin (CRESTOR) 20 MG tablet Take 20 mg by mouth at bedtime. 01/10/21   [provider]  sertraline (ZOLOFT) 100 MG tablet Take 100 mg by mouth daily. 08/30/17   [provider]  traZODone (DESYREL) 100 MG tablet Take 1 tablet (100 mg total) by mouth at bedtime as needed for sleep. 02/01/21 03/03/21  Rada Hay, MD   vitamin B-12 (CYANOCOBALAMIN) 1000 MCG tablet Take 1,000 mcg by mouth daily.    [provider]    Physical Exam: Vitals:   01/29/22 1230 01/29/22 1300 01/29/22 1400 01/29/22 1430  BP: (!) 149/86 (!) 162/84 (!) 165/91 (!) 110/97  Pulse: 97 94 98 98  Resp: 18 (!) 24 16 (!) 24  Temp:      TempSrc:      SpO2: 99% 97% 92% 98%  Weight:    69.2 kg   General: Not in acute distress HEENT:       Eyes: PERRL, EOMI, no scleral icterus.       ENT: No discharge from the ears and nose.  Neck: No JVD, no bruit, no mass felt. Heme: No neck lymph node enlargement. Cardiac: S1/S2, RRR, No murmurs, No gallops or rubs. Respiratory: No rales, wheezing, rhonchi or rubs. GI: Soft, nondistended, nontender, no organomegaly, BS present. GU: No hematuria Ext: No pitting leg edema bilaterally. 1+DP/PT pulse bilaterally. Musculoskeletal: No joint deformities, No joint redness or warmth, no limitation of ROM in spin. Skin: No rashes.  Neuro: Patient is confused, not following command, not orientated x 3, moves all extremities. Psych: Patient is not psychotic, no suicidal or hemocidal ideation.  Labs on Admission: I have personally reviewed following labs and imaging studies  CBC: Recent Labs  Lab 01/29/22 1220  WBC 14.8*  NEUTROABS 12.8*  HGB 13.3  HCT 41.1  MCV 84.6  PLT 102   Basic Metabolic Panel: Recent Labs  Lab 01/29/22 1220  NA 136  K 4.2  CL 102  CO2 28  GLUCOSE 100*  BUN 14  CREATININE 0.80  CALCIUM 9.2   GFR: Estimated Creatinine Clearance: 63.3 mL/min (by C-G formula based on SCr of 0.8 mg/dL). Liver Function Tests: Recent Labs  Lab 01/29/22 1220  AST 27  ALT 24  ALKPHOS 121  BILITOT 0.7  PROT 8.0  ALBUMIN 3.9   No results for input(s): "LIPASE", "AMYLASE" in the last 168 hours. No results for input(s): "AMMONIA" in the last 168 hours. Coagulation Profile: No results for input(s): "INR", "PROTIME" in the last 168 hours. Cardiac Enzymes: No  results for input(s): "CKTOTAL", "CKMB", "CKMBINDEX", "TROPONINI" in the last 168 hours. BNP (last 3 results) No results for input(s): "PROBNP" in the last 8760 hours. HbA1C: No results for input(s): "HGBA1C" in the last 72 hours. CBG: No results for input(s): "GLUCAP" in the last 168 hours. Lipid Profile: No results for input(s): "CHOL", "HDL", "LDLCALC", "TRIG", "CHOLHDL", "LDLDIRECT" in the last 72 hours. Thyroid Function Tests: No results for input(s): "TSH", "T4TOTAL", "FREET4", "T3FREE", "THYROIDAB" in the last 72 hours. Anemia Panel: No results for input(s): "VITAMINB12", "FOLATE", "FERRITIN", "TIBC", "IRON", "RETICCTPCT" in the last 72 hours. Urine analysis:    Component Value Date/Time   COLORURINE YELLOW (A) 06/27/2021 1810   APPEARANCEUR HAZY (A) 06/27/2021 1810   APPEARANCEUR Hazy 06/10/2014 0021   LABSPEC 1.028 06/27/2021 1810   LABSPEC 1.005 06/10/2014 0021   PHURINE 5.0 06/27/2021 1810   GLUCOSEU NEGATIVE 06/27/2021 1810   GLUCOSEU Negative 06/10/2014 0021   HGBUR SMALL (A) 06/27/2021 1810   BILIRUBINUR NEGATIVE 06/27/2021 1810   BILIRUBINUR Negative 06/10/2014 0021   KETONESUR 5 (A) 06/27/2021 1810   PROTEINUR 100 (A) 06/27/2021 1810   NITRITE NEGATIVE 06/27/2021 1810   LEUKOCYTESUR NEGATIVE 06/27/2021 1810   LEUKOCYTESUR 2+ 06/10/2014 0021   Sepsis Labs: '@LABRCNTIP'$ (procalcitonin:4,lacticidven:4) )No results found for this or any previous visit (from the past 240 hour(s)).   Radiological Exams on Admission: MR BRAIN WO CONTRAST  Result Date: 01/29/2022 CLINICAL DATA:  Seizure disorder, clinical change EXAM: MRI HEAD WITHOUT CONTRAST TECHNIQUE: Multiplanar, multiecho pulse sequences of the brain and surrounding structures were obtained without intravenous contrast. COMPARISON:  CT head 01/29/2022. MR head 08/26/2020. FINDINGS: Moderately motion limited study.  Within this limitation: Brain: No acute infarction, hemorrhage, hydrocephalus, extra-axial collection  or mass lesion. Similar scattered T2/FLAIR hyperintensities in the white matter, nonspecific but compatible with chronic microvascular ischemic disease. Vascular: Major arterial flow voids are maintained at the skull base. Skull and upper cervical spine: Normal marrow signal. Sinuses/Orbits: Negative. Other: No mastoid effusions. IMPRESSION: Moderately motion limited study without evidence of acute abnormality.  Electronically Signed   By: Margaretha Sheffield M.D.   On: 01/29/2022 16:50   CT Head Wo Contrast  Result Date: 01/29/2022 CLINICAL DATA:  Altered mental status EXAM: CT HEAD WITHOUT CONTRAST TECHNIQUE: Contiguous axial images were obtained from the base of the skull through the vertex without intravenous contrast. RADIATION DOSE REDUCTION: This exam was performed according to the departmental dose-optimization program which includes automated exposure control, adjustment of the mA and/or kV according to patient size and/or use of iterative reconstruction technique. 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 No hemorrhage. No extra-axial fluid collection. No hydrocephalus. Sequela of mild chronic microvascular ischemic change. Vascular: No hyperdense vessel or unexpected calcification. Skull: Normal. Negative for fracture or focal lesion. Sinuses/Orbits: No acute finding. Other: None. IMPRESSION: 1. 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 No hemorrhage. 2. Sequela of mild chronic microvascular ischemic change. Electronically Signed   By: Marin Roberts M.D.   On: 01/29/2022 13:40      Assessment/Plan Principal Problem:   Seizure ([place]) Active Problems:   Acute metabolic encephalopathy   Stroke [facility])   Essential hypertension   HLD (hyperlipidemia)   Depression with anxiety   Lewy body dementia ([place])   Leukocytosis   Alcohol use   Overweight (BMI 25.0-29.9)   Assessment and Plan:  Seizure [facility]): her valproic acid  level<10, indicating patient has not been compliant taking her medications. Consulted Dr. Leonel Ramsay of neuro.  -Place tele bed for obs -Seizure precaution -As needed Ativan for seizure - Recommendations by Dr. Leonel Ramsay 1) MRI brain, stroke workup only if positive for acute finding. 2) continue home aspirin 81 mg daily 3) continue home lamotrigine 125 mg twice daily, follow-up level 4) continue home Depakote 250 mg twice daily 5) if unable to take p.o. medications, would use Keppra 1 g twice daily in the interim --> I ordered keppra 1000 mg bid by IV 6) increase Onfi to 5 mg twice daily. 7) neurology will continue to follow  Acute metabolic encephalopathy: Likely due to postictal. -f/u MRI -Frequent neurocheck  Stroke [facility]): CT-head showed small age indeterminate infarct in the right caudate. No hemorrhage. -ASA and Crestor -f/u MRI-brain,  stroke workup only if positive for acute finding per Dr. Leonel Ramsay  Essential hypertension -IV hydralazine as needed -Amlodipine, propranolol  HLD (hyperlipidemia) -Crestor  Depression with anxiety -Continue home medications  Lewy body dementia ([place]) -Donepezil  Leukocytosis: WBC 14.8, no source of infection identified.  Possibly reactive -Follow-up urinalysis -Follow-up with CBC  Alcohol use: Per report, patient recently drinks some alcohol every other day, Not clear about the detailed information of drinking. -Start CIWA protocol  Overweight: Body weight 69.2 kg, BMI 28.83 -pt is taking Ozempic -Exercise and healthy diet -Patient currently losing weight   DVT ppx: SQ Lovenox  Code Status: Full code per her son  Family Communication: Yes, patient's son by phone  Disposition Plan:  Anticipate discharge back to previous environment  Consults called: Dr. Leonel Ramsay of neurology  Admission status and Level of care: Telemetry Medical:    obs    Dispo: The patient is from: Home              Anticipated d/c is to:  Home              Anticipated d/c date is: 1 day              Patient currently is not medically stable to d/c.  Severity of Illness:  The appropriate patient status for this patient is OBSERVATION. Observation status is judged to be reasonable and necessary in order to provide the required intensity of service to ensure the patient's safety. The patient's presenting symptoms, physical exam findings, and initial radiographic and laboratory data in the context of their medical condition is felt to place them at decreased risk for further clinical deterioration. Furthermore, it is anticipated that the patient will be medically stable for discharge from the hospital within 2 midnights of admission.        Date of Service 01/29/2022    Ivor Costa [facility]   If 7PM-7AM, please contact night-coverage [email or web address] 01/29/2022, 4:58 PM

## 2022-01-29 NOTE — ED Notes (Signed)
Pt to MRI

## 2022-01-29 NOTE — ED Provider Notes (Signed)
[facility] Provider Note    Event Date/Time   First MD Initiated Contact with Patient 01/29/22 1220     (approximate)   History   Chief Complaint Seizures   HPI  Sandra Charles is a 66 y.o. female with past medical history of hypertension, hyperlipidemia, Lewy body dementia, and seizures who presents to the ED following seizure.  History is limited as patient arrives postictal.  Per EMS, patient had a witnessed generalized tonic-clonic seizure at home lasting 2 to 3 minutes.  She seemed to be gradually regaining consciousness with EMS and did not require any medications.  It is unclear whether she has been compliant with her home seizure medications.     Physical Exam   Triage Vital Signs: ED Triage Vitals  Enc Vitals Group     BP 01/29/22 1214 (!) 163/81     Pulse Rate 01/29/22 1214 95     Resp 01/29/22 1214 18     Temp 01/29/22 1214 97.9 F (36.6 C)     Temp Source 01/29/22 1214 Oral     SpO2 01/29/22 1217 97 %     Weight --      Height --      Head Circumference --      Peak Flow --      Pain Score 01/29/22 1215 0     Pain Loc --      Pain Edu? --      Excl. in [place]? --     Most recent vital signs: Vitals:   01/29/22 1400 01/29/22 1430  BP: (!) 165/91 (!) 110/97  Pulse: 98 98  Resp: 16 (!) 24  Temp:    SpO2: 92% 98%    Constitutional: Awake and alert, oriented to person and place, but not time or situation. Eyes: Conjunctivae are normal. Head: Atraumatic. Nose: No congestion/rhinnorhea. Mouth/Throat: Mucous membranes are moist.  Cardiovascular: Normal rate, regular rhythm. Grossly normal heart sounds.  2+ radial pulses bilaterally. Respiratory: Normal respiratory effort.  No retractions. Lungs CTAB. Gastrointestinal: Soft and nontender. No distention. Musculoskeletal: No lower extremity tenderness nor edema.  Neurologic: Slow to respond but otherwise normal speech. No gross focal neurologic deficits are appreciated.    ED  Results / Procedures / Treatments   Labs (all labs ordered are listed, but only abnormal results are displayed) Labs Reviewed  CBC WITH DIFFERENTIAL/PLATELET - Abnormal; Notable for the following components:      Result Value   WBC 14.8 (*)    Neutro Abs 12.8 (*)    All other components within normal limits  COMPREHENSIVE METABOLIC PANEL - Abnormal; Notable for the following components:   Glucose, Bld 100 (*)    All other components within normal limits  VALPROIC ACID LEVEL - Abnormal; Notable for the following components:   Valproic Acid Lvl <10 (*)    All other components within normal limits  LAMOTRIGINE LEVEL  URINALYSIS, ROUTINE W REFLEX MICROSCOPIC  URINE DRUG SCREEN, QUALITATIVE ([facility] ONLY)  URINALYSIS, COMPLETE ([facility]) WITH MICROSCOPIC  HIV ANTIBODY (ROUTINE TESTING W REFLEX)     EKG  ED ECG REPORT I, Blake Divine, the attending physician, personally viewed and interpreted this ECG.   Date: 01/29/2022  EKG Time: 12:33  Rate: 92  Rhythm: normal sinus rhythm  Axis: Normal  Intervals:none  ST&T Change: None  RADIOLOGY CT head reviewed and interpreted by me with no hemorrhage or midline shift.  PROCEDURES:  Critical Care performed: No  Procedures   MEDICATIONS ORDERED  IN ED: Medications  LORazepam (ATIVAN) injection 1 mg (has no administration in time range)  ondansetron (ZOFRAN) injection 4 mg (has no administration in time range)  acetaminophen (TYLENOL) tablet 650 mg (has no administration in time range)  hydrALAZINE (APRESOLINE) injection 5 mg (has no administration in time range)  valproate (DEPACON) 1,200 mg in dextrose 5 % 50 mL IVPB (has no administration in time range)  enoxaparin (LOVENOX) injection 40 mg (has no administration in time range)  LORazepam (ATIVAN) 2 MG/ML injection (1 mg  Given 01/29/22 1325)  levETIRAcetam (KEPPRA) IVPB 1000 mg/100 mL premix (0 mg Intravenous Stopped 01/29/22 1403)  LORazepam (ATIVAN) injection 1 mg (1 mg  Intravenous Given 01/29/22 1403)     IMPRESSION / MDM / ASSESSMENT AND PLAN / ED COURSE  I reviewed the triage vital signs and the nursing notes.                              66 y.o. female with past medical history of hypertension, hyperlipidemia, Lewy body dementia, and seizures who presents to the ED following seizure episode at home reportedly witnessed by family and lasting for 2 to 3 minutes.  Patient's presentation is most consistent with acute presentation with potential threat to life or bodily function.  Differential diagnosis includes, but is not limited to, seizure, stroke, syncope, arrhythmia, electrolyte abnormality, medication noncompliance.  Patient nontoxic-appearing and in no acute distress, appears postictal on arrival but with no focal neurologic deficits.  Vital signs are reassuring and she is moving all extremities equally.  EKG shows no evidence of arrhythmia or ischemia and I doubt arrhythmia or cardiac etiology for her episode.  Patient was taken for CT head, unfortunately had a second seizure episode while on the CT table.  This episode again lasted 2 to 3 minutes before stopping on its own, patient given dose of IV Ativan afterwards.  Case discussed with Dr. Leonel Ramsay of neurology, who recommends 1 g of IV Keppra while awaiting Depakote and the mental levels.  Labs are reassuring and remarkable for leukocytosis with no significant anemia, electrolyte abnormality, or AKI.  LFTs are unremarkable.  Patient's Depakote level is undetectable and we will give 20 mg/kg loading dose of IV Depakote.  Case discussed with hospitalist for admission.      FINAL CLINICAL IMPRESSION(S) / ED DIAGNOSES   Final diagnoses:  Seizure ([place])  Lewy body dementia without behavioral disturbance, psychotic disturbance, mood disturbance, or anxiety, unspecified dementia severity ([place])     Rx / DC Orders   ED Discharge Orders     None        Note:  This document was prepared  using Dragon voice recognition software and may include unintentional dictation errors.   Blake Divine, MD 01/29/22 1540

## 2022-01-29 NOTE — ED Notes (Signed)
Patient resting in bed. Given multiple warm blankets after complaining of being cold. Call light within reach. Bed alarm on.

## 2022-01-29 NOTE — ED Notes (Signed)
Patient loss control of bladder during seizure. Patient cleaned with new liens. Brief and purewick applied at this time. Bed alarm on.

## 2022-01-29 NOTE — ED Notes (Signed)
This RN called that pt having seizure in CT scan. This RN and Charna Archer, MD to CT bedside. Seizure stopped at time of arrival. '1mg'$  Ativan given per Charna Archer, MD.

## 2022-01-29 NOTE — Consult Note (Signed)
Neurology Consultation Reason for Consult: Seizures Referring Physician: Mora Bellman  CC: Seizures  History is obtained from: Chart review  HPI: Sandra Charles is a 66 y.o. female with a history of dementia, hypertension, hyperlipidemia with seizures who presents with breakthrough seizures.  She had a witnessed tonic-clonic seizure at home, but was recovering in the emergency department when she had a second seizure.  She is currently on lamotrigine 125 mg twice daily, Onfi 5 mg nightly, Depakote 250 mg twice daily.  She also takes Aricept for dementia.    Past Medical History:  Diagnosis Date   Anxiety    Arthritis    "all over"   Chronic lower back pain    Dementia (HCC)    Depression    GERD (gastroesophageal reflux disease)    Headache    "weekly" (07/15/2015)   Hyperlipidemia    Hypertension    Lewy body dementia ([place])    Mini stroke    "several since 05/2014" (07/15/2015)   Psychosis in elderly, without behavioral disturbance ([place])    Seizures ([place]) dx'd 04/2015     Family History  Problem Relation Age of Onset   Hyperlipidemia Mother    Breast cancer Mother 30   Hypertension Mother    Heart attack Mother        age 30's   Hyperlipidemia Father    Hypertension Father    Heart attack Father 69   Breast cancer Sister 75     Social History:  reports that she has quit smoking. Her smoking use included cigarettes. She has a 5.00 pack-year smoking history. She has never used smokeless tobacco. She reports that she does not currently use alcohol. She reports that she does not use drugs.   Exam: Current vital signs: BP (!) 110/97   Pulse 98   Temp 97.9 F (36.6 C) (Oral)   Resp (!) 24   Wt 69.2 kg   LMP  (LMP Unknown)   SpO2 98%   BMI 28.83 kg/m  Vital signs in last 24 hours: Temp:  [97.9 F (36.6 C)] 97.9 F (36.6 C) (12/04 1214) Pulse Rate:  [94-98] 98 (12/04 1430) Resp:  [16-24] 24 (12/04 1430) BP: (110-165)/(81-97) 110/97 (12/04 1430) SpO2:  [92 %-99 %] 98 %  (12/04 1430) Weight:  [69.2 kg] 69.2 kg (12/04 1430)   Physical Exam  Constitutional: Appears well-developed and well-nourished.   Neuro: Mental Status: Patient is awake, she is able to follow some commands, such as stick out her tongue, wiggle toes, squeeze fingers.  When I ask her "are my hands cold?"  She responds "yes, real cold" but does not answer many other questions. Cranial Nerves: II: She blink to threat bilaterally. III,IV, VI: EOMI without ptosis or diploplia.  V: Facial sensation is symmetric to temperature VII: Facial movement is symmetric.  VIII: hearing is intact to voice X: Uvula elevates symmetrically XI: Shoulder shrug is symmetric. XII: tongue is midline without atrophy or fasciculations.  Motor: She moves all extremities voluntarily and in response to noxious stimulation. Sensory: She response symmetrically to mild noxious stimulation. Cerebellar: Does not perform     I have reviewed labs in epic and the results pertinent to this consultation are: VPA, less than 10 CMP-unremarkable  I have reviewed the images obtained: CT head-no definite acute findings, question new infarct in the right caudate  Impression: 66 year old female who presents with breakthrough seizure activity.  She is on a low-dose of Depakote, apparently there has been some concern about  it in the past contributing to mental status changes.  She was just recently started on Onfi, and is on a relatively low-dose, so this would likely be where we should adjust dosing.  I will increase this to 5 mg twice daily.  I suspect that her current confusion is a combination of medication effect in the setting of dementia coupled with postictal state.  She is awake enough to follow commands and answer current simple questions, so doubt ongoing seizure activity.  She will need an MRI to further investigate the called a finding, and we will continue to follow.  Recommendations: 1) MRI brain, stroke workup  only if positive for acute finding. 2) continue home aspirin 81 mg daily 3) continue home lamotrigine 125 mg twice daily, follow-up level 4) continue home Depakote 250 mg twice daily 5) if unable to take p.o. medications, would use Keppra 1 g twice daily in the interim 6) increase Onfi to 5 mg twice daily. 7) neurology will continue to follow   Roland Rack, MD [facility] [phone]  If 7pm- 7am, please page neurology on call as listed in [place].

## 2022-01-30 ENCOUNTER — Inpatient Hospital Stay
Admission: EM | Admit: 2022-01-30 | Discharge: 2022-02-01 | DRG: 071 | Disposition: A | Discharge status: Home Health | Payer: Medicare Other | Attending: Student | Admitting: Student

## 2022-01-30 ENCOUNTER — Encounter: Payer: Self-pay | Admitting: Emergency Medicine

## 2022-01-30 ENCOUNTER — Encounter: Payer: Self-pay | Admitting: Internal Medicine

## 2022-01-30 ENCOUNTER — Other Ambulatory Visit: Payer: Self-pay

## 2022-01-30 ENCOUNTER — Observation Stay: Payer: Medicare Other

## 2022-01-30 DIAGNOSIS — R569 Unspecified convulsions: Secondary | ICD-10-CM | POA: Diagnosis not present

## 2022-01-30 DIAGNOSIS — R350 Frequency of micturition: Secondary | ICD-10-CM | POA: Diagnosis present

## 2022-01-30 DIAGNOSIS — Z7982 Long term (current) use of aspirin: Secondary | ICD-10-CM

## 2022-01-30 DIAGNOSIS — G9340 Encephalopathy, unspecified: Secondary | ICD-10-CM | POA: Diagnosis present

## 2022-01-30 DIAGNOSIS — I10 Essential (primary) hypertension: Secondary | ICD-10-CM | POA: Diagnosis present

## 2022-01-30 DIAGNOSIS — R300 Dysuria: Secondary | ICD-10-CM | POA: Diagnosis present

## 2022-01-30 DIAGNOSIS — G40909 Epilepsy, unspecified, not intractable, without status epilepticus: Secondary | ICD-10-CM | POA: Diagnosis present

## 2022-01-30 DIAGNOSIS — F0284 Dementia in other diseases classified elsewhere, unspecified severity, with anxiety: Secondary | ICD-10-CM | POA: Diagnosis present

## 2022-01-30 DIAGNOSIS — Z888 Allergy status to other drugs, medicaments and biological substances status: Secondary | ICD-10-CM

## 2022-01-30 DIAGNOSIS — R062 Wheezing: Secondary | ICD-10-CM | POA: Diagnosis present

## 2022-01-30 DIAGNOSIS — R4182 Altered mental status, unspecified: Principal | ICD-10-CM

## 2022-01-30 DIAGNOSIS — Z9181 History of falling: Secondary | ICD-10-CM

## 2022-01-30 DIAGNOSIS — Z6828 Body mass index (BMI) 28.0-28.9, adult: Secondary | ICD-10-CM

## 2022-01-30 DIAGNOSIS — Z87891 Personal history of nicotine dependence: Secondary | ICD-10-CM

## 2022-01-30 DIAGNOSIS — Z79899 Other long term (current) drug therapy: Secondary | ICD-10-CM

## 2022-01-30 DIAGNOSIS — Z8249 Family history of ischemic heart disease and other diseases of the circulatory system: Secondary | ICD-10-CM

## 2022-01-30 DIAGNOSIS — Z83438 Family history of other disorder of lipoprotein metabolism and other lipidemia: Secondary | ICD-10-CM

## 2022-01-30 DIAGNOSIS — R262 Difficulty in walking, not elsewhere classified: Secondary | ICD-10-CM | POA: Diagnosis present

## 2022-01-30 DIAGNOSIS — T50916A Underdosing of multiple unspecified drugs, medicaments and biological substances, initial encounter: Secondary | ICD-10-CM | POA: Diagnosis present

## 2022-01-30 DIAGNOSIS — F250 Schizoaffective disorder, bipolar type: Secondary | ICD-10-CM | POA: Diagnosis not present

## 2022-01-30 DIAGNOSIS — G9341 Metabolic encephalopathy: Principal | ICD-10-CM | POA: Diagnosis present

## 2022-01-30 DIAGNOSIS — G3183 Dementia with Lewy bodies: Secondary | ICD-10-CM | POA: Diagnosis present

## 2022-01-30 DIAGNOSIS — Z7985 Long-term (current) use of injectable non-insulin antidiabetic drugs: Secondary | ICD-10-CM

## 2022-01-30 DIAGNOSIS — E663 Overweight: Secondary | ICD-10-CM | POA: Diagnosis present

## 2022-01-30 DIAGNOSIS — E785 Hyperlipidemia, unspecified: Secondary | ICD-10-CM | POA: Diagnosis present

## 2022-01-30 DIAGNOSIS — Z91199 Patient's noncompliance with other medical treatment and regimen due to unspecified reason: Secondary | ICD-10-CM

## 2022-01-30 DIAGNOSIS — K219 Gastro-esophageal reflux disease without esophagitis: Secondary | ICD-10-CM | POA: Diagnosis present

## 2022-01-30 DIAGNOSIS — Z791 Long term (current) use of non-steroidal anti-inflammatories (NSAID): Secondary | ICD-10-CM

## 2022-01-30 DIAGNOSIS — D72829 Elevated white blood cell count, unspecified: Secondary | ICD-10-CM | POA: Diagnosis present

## 2022-01-30 DIAGNOSIS — Z8673 Personal history of transient ischemic attack (TIA), and cerebral infarction without residual deficits: Secondary | ICD-10-CM

## 2022-01-30 DIAGNOSIS — F09 Unspecified mental disorder due to known physiological condition: Secondary | ICD-10-CM | POA: Diagnosis present

## 2022-01-30 DIAGNOSIS — F05 Delirium due to known physiological condition: Secondary | ICD-10-CM | POA: Diagnosis present

## 2022-01-30 DIAGNOSIS — R059 Cough, unspecified: Secondary | ICD-10-CM | POA: Diagnosis present

## 2022-01-30 DIAGNOSIS — R32 Unspecified urinary incontinence: Secondary | ICD-10-CM | POA: Diagnosis present

## 2022-01-30 LAB — CBC
HCT: 39.7 % (ref 36.0–46.0)
HCT: 41.6 % (ref 36.0–46.0)
Hemoglobin: 12.7 g/dL (ref 12.0–15.0)
Hemoglobin: 13.4 g/dL (ref 12.0–15.0)
MCH: 26.8 pg (ref 26.0–34.0)
MCH: 27.4 pg (ref 26.0–34.0)
MCHC: 32 g/dL (ref 30.0–36.0)
MCHC: 32.2 g/dL (ref 30.0–36.0)
MCV: 83.9 fL (ref 80.0–100.0)
MCV: 85.1 fL (ref 80.0–100.0)
Platelets: 227 10*3/uL (ref 150–400)
Platelets: 252 10*3/uL (ref 150–400)
RBC: 4.73 MIL/uL (ref 3.87–5.11)
RBC: 4.89 MIL/uL (ref 3.87–5.11)
RDW: 14.6 % (ref 11.5–15.5)
RDW: 14.9 % (ref 11.5–15.5)
WBC: 8.5 10*3/uL (ref 4.0–10.5)
WBC: 8.9 10*3/uL (ref 4.0–10.5)
nRBC: 0 % (ref 0.0–0.2)
nRBC: 0 % (ref 0.0–0.2)

## 2022-01-30 LAB — URINALYSIS, COMPLETE (UACMP) WITH MICROSCOPIC
Bacteria, UA: NONE SEEN
Bilirubin Urine: NEGATIVE
Glucose, UA: NEGATIVE mg/dL
Hgb urine dipstick: NEGATIVE
Ketones, ur: 5 mg/dL — AB
Leukocytes,Ua: NEGATIVE
Nitrite: NEGATIVE
Protein, ur: 30 mg/dL — AB
Specific Gravity, Urine: 1.032 — ABNORMAL HIGH (ref 1.005–1.030)
pH: 5 (ref 5.0–8.0)

## 2022-01-30 LAB — VALPROIC ACID LEVEL: Valproic Acid Lvl: <10 ug/mL — ABNORMAL LOW (ref 50.0–100.0)

## 2022-01-30 LAB — COMPREHENSIVE METABOLIC PANEL
ALT: 25 U/L (ref 0–44)
AST: 24 U/L (ref 15–41)
Albumin: 4 g/dL (ref 3.5–5.0)
Alkaline Phosphatase: 117 U/L (ref 38–126)
Anion gap: 9 (ref 5–15)
BUN: 26 mg/dL — ABNORMAL HIGH (ref 8–23)
CO2: 29 mmol/L (ref 22–32)
Calcium: 9.9 mg/dL (ref 8.9–10.3)
Chloride: 99 mmol/L (ref 98–111)
Creatinine, Ser: 1.13 mg/dL — ABNORMAL HIGH (ref 0.44–1.00)
GFR, Estimated: 54 mL/min — ABNORMAL LOW (ref >60)
Glucose, Bld: 96 mg/dL (ref 70–99)
Potassium: 4.2 mmol/L (ref 3.5–5.1)
Sodium: 137 mmol/L (ref 135–145)
Total Bilirubin: 1 mg/dL (ref 0.3–1.2)
Total Protein: 8.1 g/dL (ref 6.5–8.1)

## 2022-01-30 LAB — HIV ANTIBODY (ROUTINE TESTING W REFLEX): HIV Screen 4th Generation wRfx: NONREACTIVE

## 2022-01-30 LAB — URINE DRUG SCREEN, QUALITATIVE (ARMC ONLY)
Amphetamines, Ur Screen: NOT DETECTED
Barbiturates, Ur Screen: NOT DETECTED
Benzodiazepine, Ur Scrn: POSITIVE — AB
Cannabinoid 50 Ng, Ur ~~LOC~~: POSITIVE — AB
Cocaine Metabolite,Ur ~~LOC~~: NOT DETECTED
MDMA (Ecstasy)Ur Screen: NOT DETECTED
Methadone Scn, Ur: NOT DETECTED
Opiate, Ur Screen: NOT DETECTED
Phencyclidine (PCP) Ur S: NOT DETECTED
Tricyclic, Ur Screen: NOT DETECTED

## 2022-01-30 LAB — ETHANOL: Alcohol, Ethyl (B): <10 mg/dL (ref <10)

## 2022-01-30 LAB — LAMOTRIGINE LEVEL: Lamotrigine Lvl: 10 ug/mL (ref 2.0–20.0)

## 2022-01-30 MED ORDER — SODIUM CHLORIDE 0.9 % IV SOLN: INTRAVENOUS | Status: DC

## 2022-01-30 MED ORDER — ASPIRIN 81 MG PO TBEC
81.0000 mg | DELAYED_RELEASE_TABLET | Freq: Every day | ORAL | Status: DC
  Administered 2022-01-30 – 2022-02-01 (×3): 81 mg via ORAL
  Filled 2022-01-30 (×3): qty 1

## 2022-01-30 MED ORDER — ACETAMINOPHEN 650 MG RE SUPP: 650.0000 mg | Freq: Four times a day (QID) | RECTAL | Status: DC | PRN

## 2022-01-30 MED ORDER — TRAZODONE HCL 50 MG PO TABS: 25.0000 mg | ORAL_TABLET | Freq: Every evening | ORAL | Status: DC | PRN

## 2022-01-30 MED ORDER — ENOXAPARIN SODIUM 40 MG/0.4ML IJ SOSY
40.0000 mg | PREFILLED_SYRINGE | INTRAMUSCULAR | Status: DC
  Administered 2022-01-30 – 2022-01-31 (×2): 40 mg via SUBCUTANEOUS
  Filled 2022-01-30 (×2): qty 0.4

## 2022-01-30 MED ORDER — CLOBAZAM 10 MG PO TABS
5.0000 mg | ORAL_TABLET | Freq: Every day | ORAL | Status: DC
  Administered 2022-01-30 – 2022-01-31 (×2): 5 mg via ORAL
  Filled 2022-01-30 (×2): qty 1

## 2022-01-30 MED ORDER — LAMOTRIGINE 100 MG PO TABS
100.0000 mg | ORAL_TABLET | Freq: Two times a day (BID) | ORAL | Status: DC
  Administered 2022-01-30 – 2022-02-01 (×4): 100 mg via ORAL
  Filled 2022-01-30 (×4): qty 1

## 2022-01-30 MED ORDER — QUETIAPINE FUMARATE 25 MG PO TABS
50.0000 mg | ORAL_TABLET | Freq: Every day | ORAL | Status: DC
  Administered 2022-01-30: 50 mg via ORAL
  Filled 2022-01-30: qty 2

## 2022-01-30 MED ORDER — PROPRANOLOL HCL ER 60 MG PO CP24
60.0000 mg | ORAL_CAPSULE | Freq: Every day | ORAL | Status: DC
  Administered 2022-01-30 – 2022-02-01 (×3): 60 mg via ORAL
  Filled 2022-01-30 (×3): qty 1

## 2022-01-30 MED ORDER — PANTOPRAZOLE SODIUM 40 MG PO TBEC
40.0000 mg | DELAYED_RELEASE_TABLET | Freq: Every day | ORAL | Status: DC
  Administered 2022-01-30 – 2022-02-01 (×3): 40 mg via ORAL
  Filled 2022-01-30 (×3): qty 1

## 2022-01-30 MED ORDER — ONDANSETRON HCL 4 MG/2ML IJ SOLN: 4.0000 mg | Freq: Four times a day (QID) | INTRAMUSCULAR | Status: DC | PRN

## 2022-01-30 MED ORDER — LAMOTRIGINE 25 MG PO TABS: 25.0000 mg | ORAL_TABLET | Freq: Two times a day (BID) | ORAL | 0 refills | Status: AC

## 2022-01-30 MED ORDER — SODIUM CHLORIDE 0.9 % IV BOLUS
1000.0000 mL | Freq: Once | INTRAVENOUS | Status: AC
  Administered 2022-01-30: 1000 mL via INTRAVENOUS

## 2022-01-30 MED ORDER — AMLODIPINE BESYLATE 10 MG PO TABS
10.0000 mg | ORAL_TABLET | Freq: Every day | ORAL | Status: DC
  Administered 2022-01-30 – 2022-02-01 (×3): 10 mg via ORAL
  Filled 2022-01-30 (×2): qty 2
  Filled 2022-01-30: qty 1

## 2022-01-30 MED ORDER — ONDANSETRON HCL 4 MG PO TABS: 4.0000 mg | ORAL_TABLET | Freq: Four times a day (QID) | ORAL | Status: DC | PRN

## 2022-01-30 MED ORDER — MAGNESIUM HYDROXIDE 400 MG/5ML PO SUSP: 30.0000 mL | Freq: Every day | ORAL | Status: DC | PRN

## 2022-01-30 MED ORDER — ACETAMINOPHEN 325 MG PO TABS: 650.0000 mg | ORAL_TABLET | Freq: Four times a day (QID) | ORAL | Status: DC | PRN

## 2022-01-30 MED ORDER — ROSUVASTATIN CALCIUM 20 MG PO TABS
20.0000 mg | ORAL_TABLET | Freq: Every day | ORAL | Status: DC
  Administered 2022-01-30 – 2022-01-31 (×2): 20 mg via ORAL
  Filled 2022-01-30 (×2): qty 1

## 2022-01-30 MED ORDER — CLOBAZAM 10 MG PO TABS: 5.0000 mg | ORAL_TABLET | Freq: Every day | ORAL | 0 refills | Status: AC

## 2022-01-30 MED ORDER — SERTRALINE HCL 50 MG PO TABS
100.0000 mg | ORAL_TABLET | Freq: Every day | ORAL | Status: DC
  Administered 2022-01-30 – 2022-02-01 (×3): 100 mg via ORAL
  Filled 2022-01-30 (×3): qty 2

## 2022-01-30 MED ORDER — ROSUVASTATIN CALCIUM 20 MG PO TABS: 20.0000 mg | ORAL_TABLET | Freq: Every day | ORAL | 0 refills | Status: DC

## 2022-01-30 MED ORDER — TRAZODONE HCL 50 MG PO TABS
100.0000 mg | ORAL_TABLET | Freq: Every evening | ORAL | Status: DC | PRN
  Filled 2022-01-30: qty 2

## 2022-01-30 MED ORDER — LAMOTRIGINE 25 MG PO TABS
25.0000 mg | ORAL_TABLET | Freq: Two times a day (BID) | ORAL | Status: DC
  Administered 2022-01-30 – 2022-02-01 (×4): 25 mg via ORAL
  Filled 2022-01-30 (×4): qty 1

## 2022-01-30 MED ORDER — LAMOTRIGINE 100 MG PO TABS: 100.0000 mg | ORAL_TABLET | Freq: Two times a day (BID) | ORAL | 0 refills | Status: AC

## 2022-01-30 MED ORDER — DONEPEZIL HCL 5 MG PO TABS
10.0000 mg | ORAL_TABLET | Freq: Every day | ORAL | Status: DC
  Administered 2022-01-30 – 2022-01-31 (×2): 10 mg via ORAL
  Filled 2022-01-30 (×2): qty 2

## 2022-01-30 MED ORDER — MELOXICAM 7.5 MG PO TABS
7.5000 mg | ORAL_TABLET | Freq: Every day | ORAL | Status: DC
  Administered 2022-01-30 – 2022-02-01 (×3): 7.5 mg via ORAL
  Filled 2022-01-30 (×3): qty 1

## 2022-01-30 MED ORDER — CLOBAZAM 10 MG PO TABS: 5.0000 mg | ORAL_TABLET | Freq: Every day | ORAL | Status: DC

## 2022-01-30 NOTE — ED Notes (Signed)
Report given to Gavin Potters RN

## 2022-01-30 NOTE — Assessment & Plan Note (Signed)
-   We will continue PPI therapy 

## 2022-01-30 NOTE — Assessment & Plan Note (Signed)
-   We will continue Seroquel and Zoloft.

## 2022-01-30 NOTE — Plan of Care (Signed)

## 2022-01-30 NOTE — H&P (Signed)
[place]   PATIENT NAME: Sandra Charles    MR#:  [id number]  DATE OF BIRTH:  [birth year]  DATE OF ADMISSION:  01/30/2022  PRIMARY CARE PHYSICIAN: Sandra Jacobs, MD   Patient is coming from: Home  REQUESTING/REFERRING PHYSICIAN: Conni Slipper, MD  CHIEF COMPLAINT:   Chief Complaint  Patient presents with   Altered Mental Status    HISTORY OF PRESENT ILLNESS:  Sandra Charles is a 66 y.o. African-American female with medical history significant for seizure, Lewy body dementia, hypertension, hyperlipidemia, overweight obesity BMI 28.83, depression with anxiety, psychosis, who presented to the emergency room with acute onset of altered mental status with extreme incoherence.  Her husband.  She was just admitted here for seizure and postictal confusion and had an essentially negative brain MRI.  She was given IV Keppra and her Depakote was continued as well as her Lamictal and her Onfi was increased to 5 mg p.o. twice daily per Dr. Leonel Ramsay.  She was discharged home earlier today.  There has been stated that she had a hard time walking and her balance was off.  Per his report she is highly prone to falling.  He stated that she fell on almost hit herself against a edge of the dresser that ''could have killed her''.  The patient admits to urinary frequency and dysuria with associated urinary incontinence.  No hematuria or flank pain.  She has not been eating or drinking much today.  No cough or wheezing or dyspnea.  No chest pain or palpitations.  No paresthesias or focal muscle weakness.  She has mild dry cough with wheezing without dyspnea.  No nausea or vomiting or abdominal pain.  No melena or bright red bleeding per rectum.  No other bleeding diathesis..  ED Course: Upon presentation to the emergency room BP was 149/86 with heart rate of 108 and later BP was 165/91.  Labs revealed a BUN of 26 and creatinine 1.13 compared to 14 and 0.8 yesterday. EKG as reviewed by me : Pending Imaging:  Portable chest x-ray is pending.  The patient will be admitted to an observation medical telemetry bed for further evaluation and management. PAST MEDICAL HISTORY:   Past Medical History:  Diagnosis Date   Anxiety    Arthritis    "all over"   Chronic lower back pain    Dementia ([place])    Depression    GERD (gastroesophageal reflux disease)    Headache    "weekly" (07/15/2015)   Hyperlipidemia    Hypertension    Lewy body dementia ([place])    Mini stroke    "several since 05/2014" (07/15/2015)   Psychosis in elderly, without behavioral disturbance ([place])    Seizures ([place]) dx'd 04/2015    PAST SURGICAL HISTORY:   Past Surgical History:  Procedure Laterality Date   COLONOSCOPY WITH PROPOFOL N/A 11/20/2021   Procedure: COLONOSCOPY WITH PROPOFOL;  Surgeon: Lesly Rubenstein, MD;  Location: [facility] ENDOSCOPY;  Service: Endoscopy;  Laterality: N/A;   DILATION AND CURETTAGE OF UTERUS     ESOPHAGOGASTRODUODENOSCOPY (EGD) WITH PROPOFOL N/A 11/20/2021   Procedure: ESOPHAGOGASTRODUODENOSCOPY (EGD) WITH PROPOFOL;  Surgeon: Lesly Rubenstein, MD;  Location: [facility] ENDOSCOPY;  Service: Endoscopy;  Laterality: N/A;   FRACTURE SURGERY     LUMBAR PUNCTURE  07/14/2015   ORIF ANKLE FRACTURE Right 01/20/2021   Procedure: OPEN REDUCTION INTERNAL FIXATION (ORIF) ANKLE FRACTURE;  Surgeon: Corky Mull, MD;  Location: [facility] ORS;  Service: Orthopedics;  Laterality: Right;  TUBAL LIGATION     VAGINAL HYSTERECTOMY      SOCIAL HISTORY:   Social History   Tobacco Use   Smoking status: Former    Packs/day: 1.00    Years: 5.00    Total pack years: 5.00    Types: Cigarettes   Smokeless tobacco: Never   Tobacco comments:    "quit smoking cigarettes in the early 2000s"  Substance Use Topics   Alcohol use: Not Currently    Comment: 07/15/2015 "nothing in the last few years"    FAMILY HISTORY:   Family History  Problem Relation Age of Onset   Hyperlipidemia Mother    Breast cancer Mother 64    Hypertension Mother    Heart attack Mother        age 57's   Hyperlipidemia Father    Hypertension Father    Heart attack Father 55   Breast cancer Sister 74    DRUG ALLERGIES:   Allergies  Allergen Reactions   Lovastatin Other (See Comments)    Unknown reaction    REVIEW OF SYSTEMS:   ROS As per history of present illness. All pertinent systems were reviewed above. Constitutional, HEENT, cardiovascular, respiratory, GI, GU, musculoskeletal, neuro, psychiatric, endocrine, integumentary and hematologic systems were reviewed and are otherwise negative/unremarkable except for positive findings mentioned above in the HPI.   MEDICATIONS AT HOME:   Prior to Admission medications   Medication Sig Start Date End Date Taking? Authorizing Provider  cloBAZam (ONFI) 10 MG tablet Take 0.5 tablets (5 mg total) by mouth at bedtime. 01/30/22 03/01/22 Yes Alexander, Lanelle Bal, DO  amLODipine (NORVASC) 10 MG tablet Take 10 mg by mouth daily. For high blood pressure    [provider]  aspirin EC 81 MG tablet Take 81 mg by mouth daily. Swallow whole.    [provider]  donepezil (ARICEPT) 10 MG tablet Take 10 mg by mouth at bedtime.    [provider]  ibuprofen (ADVIL) 800 MG tablet Take 800 mg by mouth daily. 01/19/22   [provider]  lamoTRIgine (LAMICTAL) 100 MG tablet Take 1 tablet (100 mg total) by mouth 2 (two) times daily. Along with 25 mg tablet for total of 125 mg bid. 01/30/22   Emeterio Reeve, DO  lamoTRIgine (LAMICTAL) 25 MG tablet Take 1 tablet (25 mg total) by mouth 2 (two) times daily. Along with 100 mg tablet for total of 125 mg bid. 01/30/22   Emeterio Reeve, DO  meloxicam (MOBIC) 7.5 MG tablet Take 7.5 mg by mouth daily. 11/14/21   [provider]  OZEMPIC, 1 MG/DOSE, 4 MG/3ML SOPN Inject into the skin. Pt taking on fridays 04/28/21   [provider]  pantoprazole (PROTONIX) 40 MG tablet Take 1 tablet (40 mg total) by mouth  daily. 11/25/14   Wellington Hampshire, MD  propranolol ER (INDERAL LA) 60 MG 24 hr capsule Take 60 mg by mouth daily. 09/15/17   [provider]  QUEtiapine (SEROQUEL) 50 MG tablet Take 1 tablet (50 mg total) by mouth at bedtime. 07/07/21   Parks Ranger, DO  rosuvastatin (CRESTOR) 20 MG tablet Take 1 tablet (20 mg total) by mouth at bedtime. 01/30/22   Emeterio Reeve, DO  sertraline (ZOLOFT) 100 MG tablet Take 100 mg by mouth daily. 08/30/17   [provider]  traZODone (DESYREL) 100 MG tablet Take 1 tablet (100 mg total) by mouth at bedtime as needed for sleep. 02/01/21 03/03/21  Rada Hay, MD  vitamin B-12 (  CYANOCOBALAMIN) 1000 MCG tablet Take 1,000 mcg by mouth daily. Patient not taking: Reported on 01/29/2022    [provider]      VITAL SIGNS:  Blood pressure (!) 152/77, pulse 68, temperature 97.8 F (36.6 C), temperature source Oral, resp. rate 18, height '5\' 1"'$  (1.549 m), weight 69.2 kg, SpO2 96 %.  PHYSICAL EXAMINATION:  Physical Exam  GENERAL:  66 y.o.-year-old African-American female patient lying in the bed with no acute distress.  EYES: Pupils equal, round, reactive to light and accommodation. No scleral icterus. Extraocular muscles intact.  HEENT: Head atraumatic, normocephalic. Oropharynx and nasopharynx clear.  NECK:  Supple, no jugular venous distention. No thyroid enlargement, no tenderness.  LUNGS: Normal breath sounds bilaterally, no wheezing, rales,rhonchi or crepitation. No use of accessory muscles of respiration.  CARDIOVASCULAR: Regular rate and rhythm, S1, S2 normal. No murmurs, rubs, or gallops.  ABDOMEN: Soft, nondistended, nontender. Bowel sounds present. No organomegaly or mass.  EXTREMITIES: No pedal edema, cyanosis, or clubbing.  NEUROLOGIC: Cranial nerves II through XII are intact. Muscle strength 5/5 in all extremities. Sensation intact. Gait not checked.  PSYCHIATRIC: The patient is alert and oriented x 3.  Normal  affect and good eye contact. SKIN: No obvious rash, lesion, or ulcer.   LABORATORY PANEL:   CBC Recent Labs  Lab 01/30/22 1758  WBC 8.9  HGB 13.4  HCT 41.6  PLT 252   ------------------------------------------------------------------------------------------------------------------  Chemistries  Recent Labs  Lab 01/29/22 1552 01/30/22 1758  NA  --  137  K  --  4.2  CL  --  99  CO2  --  29  GLUCOSE  --  96  BUN  --  26*  CREATININE  --  1.13*  CALCIUM  --  9.9  MG 2.1  --   AST  --  24  ALT  --  25  ALKPHOS  --  117  BILITOT  --  1.0   ------------------------------------------------------------------------------------------------------------------  Cardiac Enzymes No results for input(s): "TROPONINI" in the last 168 hours. ------------------------------------------------------------------------------------------------------------------  RADIOLOGY:  DG Chest Port 1 View  Result Date: 01/30/2022 CLINICAL DATA:  Cough and altered mental status. EXAM: PORTABLE CHEST 1 VIEW COMPARISON:  05/01/2021. FINDINGS: The cardiomediastinal contours are normal. The lungs are clear. Pulmonary vasculature is normal. No consolidation, pleural effusion, or pneumothorax. No acute osseous abnormalities are seen. IMPRESSION: No acute chest findings. Electronically Signed   By: Keith Rake M.D.   On: 01/30/2022 21:49   MR BRAIN WO CONTRAST  Result Date: 01/29/2022 CLINICAL DATA:  Seizure disorder, clinical change EXAM: MRI HEAD WITHOUT CONTRAST TECHNIQUE: Multiplanar, multiecho pulse sequences of the brain and surrounding structures were obtained without intravenous contrast. COMPARISON:  CT head 01/29/2022. MR head 08/26/2020. FINDINGS: Moderately motion limited study.  Within this limitation: Brain: No acute infarction, hemorrhage, hydrocephalus, extra-axial collection or mass lesion. Similar scattered T2/FLAIR hyperintensities in the white matter, nonspecific but compatible with  chronic microvascular ischemic disease. Vascular: Major arterial flow voids are maintained at the skull base. Skull and upper cervical spine: Normal marrow signal. Sinuses/Orbits: Negative. Other: No mastoid effusions. IMPRESSION: Moderately motion limited study without evidence of acute abnormality. Electronically Signed   By: Margaretha Sheffield M.D.   On: 01/29/2022 16:50   CT Head Wo Contrast  Result Date: 01/29/2022 CLINICAL DATA:  Altered mental status EXAM: CT HEAD WITHOUT CONTRAST TECHNIQUE: Contiguous axial images were obtained from the base of the skull through the vertex without intravenous contrast. RADIATION DOSE REDUCTION: This exam was performed  according to the departmental dose-optimization program which includes automated exposure control, adjustment of the mA and/or kV according to patient size and/or use of iterative reconstruction technique. 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 No hemorrhage. No extra-axial fluid collection. No hydrocephalus. Sequela of mild chronic microvascular ischemic change. Vascular: No hyperdense vessel or unexpected calcification. Skull: Normal. Negative for fracture or focal lesion. Sinuses/Orbits: No acute finding. Other: None. IMPRESSION: 1. 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 No hemorrhage. 2. Sequela of mild chronic microvascular ischemic change. Electronically Signed   By: Marin Roberts M.D.   On: 01/29/2022 13:40      IMPRESSION AND PLAN:  Assessment and Plan: * Acute encephalopathy - The patient be admitted to an observation medical telemetry bed. - Given her cough and wheezing will obtain a portable chest x-ray. - Given her urinary frequency and dysuria will obtain urinalysis. - We will place on hydration with IV normal saline. - We will continue monitoring neurochecks every 4 hours for 24 hours. - We will continue one-on-one monitoring given her recent  fall.  Seizure disorder ([place]) - We will continue Lamictal 125 mg p.o. twice daily and clobazam 5 mg p.o. nightly. - She will be placed on seizure precautions.  Essential hypertension - We will continue propranolol ER and Norvasc.  Schizoaffective disorder, bipolar type ([place]) - We will continue Seroquel and Zoloft.  GERD without esophagitis - We will continue PPI therapy.  Dyslipidemia - We will continue statin therapy.   DVT prophylaxis: Lovenox.  Advanced Care Planning:  Code Status: full code.  Family Communication:  The plan of care was discussed in details with the patient (and family). I answered all questions. The patient agreed to proceed with the above mentioned plan. Further management will depend upon hospital course. Disposition Plan: Back to previous home environment Consults called: none.  All the records are reviewed and case discussed with ED provider.  Status is: Observation   I certify that at the time of admission, it is my clinical judgment that the patient will require inpatient hospital care extending more than 2 midnights.                            Dispo: The patient is from: Home              Anticipated d/c is to: Home              Patient currently is not medically stable to d/c.              Difficult to place patient: No  Christel Mormon M.D on 01/30/2022 at 10:01 PM  [facility]   From 7 PM-7 AM, contact night-coverage [email or web address]  CC: Primary care physician; Sandra Jacobs, MD

## 2022-01-30 NOTE — ED Triage Notes (Signed)
Patient arrives in wheelchair to triage with niece stating patient was discharged from hospital this morning for seizures however patient is not back to her baseline. Per niece the patient was too difficult at home today so husband called EMS. Patient alert to self, location and situation however confused on date. Niece reports memory issues at baseline.

## 2022-01-30 NOTE — ED Provider Triage Note (Signed)
Emergency Medicine Provider Triage Evaluation Note  Sandra Charles , a 66 y.o. female  was evaluated in triage.  Pt returns to the ER with her niece after being discharged this afternoon. Per niece, patient is not back to baseline after having had seizure activity. She was monitored overnight and started on new medication. Niece states that once she was home, she urinated on herself, which is unusual. She has also continued to be confused more than at baseline..   Physical Exam  BP (!) 152/77   Pulse 68   Temp 97.8 F (36.6 C) (Oral)   Resp 18   Ht '5\' 1"'$  (1.549 m)   Wt 69.2 kg   LMP  (LMP Unknown)   SpO2 96%   BMI 28.83 kg/m  Gen:   Awake, no distress   Resp:  Normal effort  MSK:   Moves extremities without difficulty  Other:    Medical Decision Making  Medically screening exam initiated at 6:09 PM.  Appropriate orders placed.  [name] was informed that the remainder of the evaluation will be completed by another provider, this initial triage assessment does not replace that evaluation, and the importance of remaining in the ED until their evaluation is complete.     Victorino Dike, FNP 01/30/22 [phone]

## 2022-01-30 NOTE — TOC CM/SW Note (Signed)
  Transition of Care [facility]) Screening Note   Patient Details  Name: Sandra Charles Date of Birth: Sep 24, 1955   Transition of Care [facility]) CM/SW Contact:    Colen Darling, [place] Phone Number: 01/30/2022, 12:46 PM    Transition of Care Department [facility]) has reviewed patient and no TOC needs have been identified at this time. We will continue to monitor patient advancement through interdisciplinary progression rounds. If new patient transition needs arise, please place a TOC consult.

## 2022-01-30 NOTE — ED Notes (Signed)
PT brought to ED rm 17 at this time, pt placed on a inpatient hospital bed. Family at the bedside. MD Cinda Quest notified that family would like to speak with him.

## 2022-01-30 NOTE — Plan of Care (Signed)
  Problem: Education: Goal: Knowledge of General Education information will improve Description: Including pain rating scale, medication(s)/side effects and non-pharmacologic comfort measures 01/30/2022 1301 by Mancel Bale, RN Outcome: Adequate for Discharge 01/30/2022 [id number] by Mancel Bale, RN Outcome: Progressing   Problem: Health Behavior/Discharge Planning: Goal: Ability to manage health-related needs will improve 01/30/2022 1301 by Mancel Bale, RN Outcome: Adequate for Discharge 01/30/2022 [id number] by Mancel Bale, RN Outcome: Progressing   Problem: Clinical Measurements: Goal: Ability to maintain clinical measurements within normal limits will improve 01/30/2022 1301 by Mancel Bale, RN Outcome: Adequate for Discharge 01/30/2022 3570 by Mancel Bale, RN Outcome: Progressing Goal: Will remain free from infection 01/30/2022 1301 by Mancel Bale, RN Outcome: Adequate for Discharge 01/30/2022 1779 by Mancel Bale, RN Outcome: Progressing Goal: Diagnostic test results will improve 01/30/2022 1301 by Mancel Bale, RN Outcome: Adequate for Discharge 01/30/2022 3903 by Mancel Bale, RN Outcome: Progressing Goal: Respiratory complications will improve 01/30/2022 1301 by Mancel Bale, RN Outcome: Adequate for Discharge 01/30/2022 0092 by Mancel Bale, RN Outcome: Progressing Goal: Cardiovascular complication will be avoided 01/30/2022 1301 by Mancel Bale, RN Outcome: Adequate for Discharge 01/30/2022 3300 by Mancel Bale, RN Outcome: Progressing   Problem: Activity: Goal: Risk for activity intolerance will decrease 01/30/2022 1301 by Mancel Bale, RN Outcome: Adequate for Discharge 01/30/2022 7622 by Mancel Bale, RN Outcome: Progressing   Problem: Nutrition: Goal: Adequate nutrition will be maintained 01/30/2022 1301 by Mancel Bale, RN Outcome: Adequate for Discharge 01/30/2022 6333 by Mancel Bale, RN Outcome:  Progressing   Problem: Coping: Goal: Level of anxiety will decrease 01/30/2022 1301 by Mancel Bale, RN Outcome: Adequate for Discharge 01/30/2022 5456 by Mancel Bale, RN Outcome: Progressing   Problem: Elimination: Goal: Will not experience complications related to bowel motility 01/30/2022 1301 by Mancel Bale, RN Outcome: Adequate for Discharge 01/30/2022 2563 by Mancel Bale, RN Outcome: Progressing Goal: Will not experience complications related to urinary retention 01/30/2022 1301 by Mancel Bale, RN Outcome: Adequate for Discharge 01/30/2022 8937 by Mancel Bale, RN Outcome: Progressing   Problem: Pain Managment: Goal: General experience of comfort will improve 01/30/2022 1301 by Mancel Bale, RN Outcome: Adequate for Discharge 01/30/2022 3428 by Mancel Bale, RN Outcome: Progressing   Problem: Safety: Goal: Ability to remain free from injury will improve 01/30/2022 1301 by Mancel Bale, RN Outcome: Adequate for Discharge 01/30/2022 7681 by Mancel Bale, RN Outcome: Progressing   Problem: Skin Integrity: Goal: Risk for impaired skin integrity will decrease 01/30/2022 1301 by Mancel Bale, RN Outcome: Adequate for Discharge 01/30/2022 1572 by Mancel Bale, RN Outcome: Progressing

## 2022-01-30 NOTE — ED Notes (Signed)
Pt confused   bed alarm on.

## 2022-01-30 NOTE — Assessment & Plan Note (Addendum)
-   We will continue propranolol ER and Norvasc.

## 2022-01-30 NOTE — Assessment & Plan Note (Signed)
-   The patient be admitted to an observation medical telemetry bed. - Given her cough and wheezing will obtain a portable chest x-ray. - Given her urinary frequency and dysuria will obtain urinalysis. - We will place on hydration with IV normal saline. - We will continue monitoring neurochecks every 4 hours for 24 hours. - We will continue one-on-one monitoring given her recent fall.

## 2022-01-30 NOTE — Assessment & Plan Note (Signed)
-   We will continue Lamictal 125 mg p.o. twice daily and clobazam 5 mg p.o. nightly. - She will be placed on seizure precautions.

## 2022-01-30 NOTE — Progress Notes (Signed)
Subjective: Patient is markedly improved  Exam: Vitals:   01/30/22 0400 01/30/22 0527  BP: (!) 120/58 137/71  Pulse: 72 70  Resp: 17 18  Temp:  97.7 F (36.5 C)  SpO2: 98% 98%   Gen: In bed, NAD Resp: non-labored breathing, no acute distress Abd: soft, nt  Neuro: MS: awake, alert, not oriented to month or year, does know she is in the hospital CN:VFF, EOMI Motor: 5/5 throughout Sensory:intact to LT  Pertinent Labs: VPA < 10 LTG pending  Impression: 66 yo F with breakthrough seizures. After discussion with family, she is currently on lamotrigine monotherapy as outpatient. She apparently has had an issue with keppra in the past as well, though I am unclear on this. The most recent outpatient neuro note suggested clobazam in the case of additional seizures and I think this is a reasonable next step. I will use a dose of '5mg'$  QHS since she was not taking it previously.   Recommendations: 1) Continue home LTG '125mg'$  BID 2) Continue clobazam '5mg'$  qhs 3) stop keppra.  4) She can follow up with outpatient neurology, neurology will be available on an as needed basis.   Roland Rack, MD [facility] [phone]  If 7pm- 7am, please page neurology on call as listed in [place].

## 2022-01-30 NOTE — ED Notes (Signed)
Dr. Sidney Ace at bedside speaking with family.

## 2022-01-30 NOTE — Discharge Summary (Signed)
Physician Discharge Summary   Patient: Sandra Charles MRN: [id number]  DOB: 10-29-1955   Admit:     Date of Admission: 01/29/2022 Admitted from: home   Discharge: Date of discharge: 01/30/22 Disposition: Home Condition at discharge: good  CODE STATUS: FULL CODE      Discharge Physician: Emeterio Reeve, DO [facility]     PCP: Ashley Jacobs, MD  Recommendations for Outpatient Follow-up:  Follow up with PCP Ashley Jacobs, MD in 2-4 weeks Follow up with neurologist  Please obtain labs/tests: CBC, BMP, Lamotrigine levels in 2-4 weeks  Please follow up on the following pending results: none   Discharge Instructions     Diet - low sodium heart healthy   Complete by: As directed    Increase activity slowly   Complete by: As directed          Discharge Diagnoses: Principal Problem:   Seizure ([place]) Active Problems:   Acute metabolic encephalopathy   Stroke [facility])   Essential hypertension   HLD (hyperlipidemia)   Depression with anxiety   Lewy body dementia ([place])   Leukocytosis   Alcohol use   Overweight (BMI 25.0-29.9)       Hospital Course: [name] is a 66 y.o. female with medical history significant of seizure, Lewy body dementia, hypertension, hyperlipidemia, overweight obesity BMI 28.83, depression with anxiety, psychosis, who presents with seizure.  12/04: Per H&P "Per her son, patient had seizure at home. She had another seizure when she was doing CT scan in the hospital per ED physician. When I saw patient in ED, patient is confused, not oriented x 3.  Not following command.  She moves all extremities.  No facial droop.  Per her son at her normal baseline, patient recognizes family members, sometimes orientated to the place, sometimes oriented to time." WBC 14.8, valproic acid level <10, GFR> 60, afebrile, BP 165/91, heart rate 98, RR 24, SpO2 97% RA. CT head (+)Small age indeterminate infarct in the right caudate, no hemorrhage.   Dr.  Leonel Ramsay of neurology is consulted. MRI brain ordered. Continue home lamotrigine 125 mg bid and follow levels, continue home Depakote 250 mg bid, if unable to take po ok for Keppra 1g bid (this was ordered by admitting physician), increase Onfi to 5 mg bid. CVA w/u only if (+)MRI.  12/05: VSS, WBC WNL. MRI brain "Moderately motion limited study without evidence of acute abnormality" cleared for d/c by neurology on medications as per med rec   Consultants:  Neurology   Procedures: none      ASSESSMENT & PLAN:   Principal Problem:   Seizure ([place]) Active Problems:   Acute metabolic encephalopathy   Stroke [facility])   Essential hypertension   HLD (hyperlipidemia)   Depression with anxiety   Lewy body dementia ([place])   Leukocytosis   Alcohol use   Overweight (BMI 25.0-29.9)   Seizures 1) Continue home LTG '125mg'$  BID 2) Continue clobazam '5mg'$  qhs 3) stop keppra.  4) She can follow up with outpatient neurology          Discharge Instructions  Allergies as of 01/30/2022       Reactions   Lovastatin Other (See Comments)   Unknown reaction        Medication List     STOP taking these medications    cloNIDine 0.1 MG tablet Commonly known as: CATAPRES   divalproex 250 MG DR tablet Commonly known as: DEPAKOTE  TAKE these medications    amLODipine 10 MG tablet Commonly known as: NORVASC Take 10 mg by mouth daily. For high blood pressure   aspirin EC 81 MG tablet Take 81 mg by mouth daily. Swallow whole.   cloBAZam 10 MG tablet Commonly known as: Onfi Take 0.5 tablets (5 mg total) by mouth at bedtime.   cyanocobalamin 1000 MCG tablet Commonly known as: VITAMIN B12 Take 1,000 mcg by mouth daily.   donepezil 10 MG tablet Commonly known as: ARICEPT Take 10 mg by mouth at bedtime.   ibuprofen 800 MG tablet Commonly known as: ADVIL Take 800 mg by mouth daily.   lamoTRIgine 100 MG tablet Commonly known as: LAMICTAL Take 1 tablet (100 mg total)  by mouth 2 (two) times daily. Along with 25 mg tablet for total of 125 mg bid. What changed:  how much to take how to take this when to take this additional instructions   lamoTRIgine 25 MG tablet Commonly known as: LAMICTAL Take 1 tablet (25 mg total) by mouth 2 (two) times daily. Along with 100 mg tablet for total of 125 mg bid. What changed:  when to take this additional instructions   meloxicam 7.5 MG tablet Commonly known as: MOBIC Take 7.5 mg by mouth daily.   Ozempic (1 MG/DOSE) 4 MG/3ML Sopn Generic drug: Semaglutide (1 MG/DOSE) Inject into the skin. Pt taking on fridays What changed: Another medication with the same name was removed. Continue taking this medication, and follow the directions you see here.   pantoprazole 40 MG tablet Commonly known as: Protonix Take 1 tablet (40 mg total) by mouth daily.   propranolol ER 60 MG 24 hr capsule Commonly known as: INDERAL LA Take 60 mg by mouth daily.   QUEtiapine 50 MG tablet Commonly known as: SEROQUEL Take 1 tablet (50 mg total) by mouth at bedtime.   rosuvastatin 20 MG tablet Commonly known as: CRESTOR Take 1 tablet (20 mg total) by mouth at bedtime.   sertraline 100 MG tablet Commonly known as: ZOLOFT Take 100 mg by mouth daily.   traZODone 100 MG tablet Commonly known as: DESYREL Take 1 tablet (100 mg total) by mouth at bedtime as needed for sleep.          Allergies  Allergen Reactions   Lovastatin Other (See Comments)    Unknown reaction     Subjective: pt reports no concerns today, no headache, no vision changes, no focal weakness    Discharge Exam: BP (!) 108/57 (BP Location: Right Arm)   Pulse 69   Temp 97.7 F (36.5 C)   Resp 18   Ht '5\' 1"'$  (1.549 m)   Wt 69.2 kg   LMP  (LMP Unknown)   SpO2 95%   BMI 28.83 kg/m  General: Pt is alert, awake, not in acute distress Cardiovascular: RRR, S1/S2 +, no rubs, no gallops Respiratory: CTA bilaterally, no wheezing, no rhonchi Abdominal:  Soft, NT, ND, bowel sounds + Extremities: no edema, no cyanosis     The results of significant diagnostics from this hospitalization (including imaging, microbiology, ancillary and laboratory) are listed below for reference.     Microbiology: No results found for this or any previous visit (from the past 240 hour(s)).   Labs: BNP (last 3 results) No results for input(s): "BNP" in the last 8760 hours. Basic Metabolic Panel: Recent Labs  Lab 01/29/22 1220 01/29/22 1552  NA 136  --   K 4.2  --   CL 102  --  CO2 28  --   GLUCOSE 100*  --   BUN 14  --   CREATININE 0.80  --   CALCIUM 9.2  --   MG  --  2.1   Liver Function Tests: Recent Labs  Lab 01/29/22 1220  AST 27  ALT 24  ALKPHOS 121  BILITOT 0.7  PROT 8.0  ALBUMIN 3.9   No results for input(s): "LIPASE", "AMYLASE" in the last 168 hours. No results for input(s): "AMMONIA" in the last 168 hours. CBC: Recent Labs  Lab 01/29/22 1220 01/30/22 0544  WBC 14.8* 8.5  NEUTROABS 12.8*  --   HGB 13.3 12.7  HCT 41.1 39.7  MCV 84.6 83.9  PLT 256 227   Cardiac Enzymes: No results for input(s): "CKTOTAL", "CKMB", "CKMBINDEX", "TROPONINI" in the last 168 hours. BNP: Invalid input(s): "POCBNP" CBG: No results for input(s): "GLUCAP" in the last 168 hours. D-Dimer No results for input(s): "DDIMER" in the last 72 hours. Hgb A1c No results for input(s): "HGBA1C" in the last 72 hours. Lipid Profile No results for input(s): "CHOL", "HDL", "LDLCALC", "TRIG", "CHOLHDL", "LDLDIRECT" in the last 72 hours. Thyroid function studies No results for input(s): "TSH", "T4TOTAL", "T3FREE", "THYROIDAB" in the last 72 hours.  Invalid input(s): "FREET3" Anemia work up No results for input(s): "VITAMINB12", "FOLATE", "FERRITIN", "TIBC", "IRON", "RETICCTPCT" in the last 72 hours. Urinalysis    Component Value Date/Time   COLORURINE YELLOW (A) 06/27/2021 1810   APPEARANCEUR HAZY (A) 06/27/2021 1810   APPEARANCEUR Hazy 06/10/2014  0021   LABSPEC 1.028 06/27/2021 1810   LABSPEC 1.005 06/10/2014 0021   PHURINE 5.0 06/27/2021 1810   GLUCOSEU NEGATIVE 06/27/2021 1810   GLUCOSEU Negative 06/10/2014 0021   HGBUR SMALL (A) 06/27/2021 1810   BILIRUBINUR NEGATIVE 06/27/2021 1810   BILIRUBINUR Negative 06/10/2014 0021   KETONESUR 5 (A) 06/27/2021 1810   PROTEINUR 100 (A) 06/27/2021 1810   NITRITE NEGATIVE 06/27/2021 1810   LEUKOCYTESUR NEGATIVE 06/27/2021 1810   LEUKOCYTESUR 2+ 06/10/2014 0021   Sepsis Labs Recent Labs  Lab 01/29/22 1220 01/30/22 0544  WBC 14.8* 8.5   Microbiology No results found for this or any previous visit (from the past 240 hour(s)). Imaging MR BRAIN WO CONTRAST  Result Date: 01/29/2022 CLINICAL DATA:  Seizure disorder, clinical change EXAM: MRI HEAD WITHOUT CONTRAST TECHNIQUE: Multiplanar, multiecho pulse sequences of the brain and surrounding structures were obtained without intravenous contrast. COMPARISON:  CT head 01/29/2022. MR head 08/26/2020. FINDINGS: Moderately motion limited study.  Within this limitation: Brain: No acute infarction, hemorrhage, hydrocephalus, extra-axial collection or mass lesion. Similar scattered T2/FLAIR hyperintensities in the white matter, nonspecific but compatible with chronic microvascular ischemic disease. Vascular: Major arterial flow voids are maintained at the skull base. Skull and upper cervical spine: Normal marrow signal. Sinuses/Orbits: Negative. Other: No mastoid effusions. IMPRESSION: Moderately motion limited study without evidence of acute abnormality. Electronically Signed   By: Margaretha Sheffield M.D.   On: 01/29/2022 16:50   CT Head Wo Contrast  Result Date: 01/29/2022 CLINICAL DATA:  Altered mental status EXAM: CT HEAD WITHOUT CONTRAST TECHNIQUE: Contiguous axial images were obtained from the base of the skull through the vertex without intravenous contrast. RADIATION DOSE REDUCTION: This exam was performed according to the departmental  dose-optimization program which includes automated exposure control, adjustment of the mA and/or kV according to patient size and/or use of iterative reconstruction technique. 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 No hemorrhage. No extra-axial fluid collection. No  hydrocephalus. Sequela of mild chronic microvascular ischemic change. Vascular: No hyperdense vessel or unexpected calcification. Skull: Normal. Negative for fracture or focal lesion. Sinuses/Orbits: No acute finding. Other: None. IMPRESSION: 1. 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 No hemorrhage. 2. Sequela of mild chronic microvascular ischemic change. Electronically Signed   By: Marin Roberts M.D.   On: 01/29/2022 13:40      Time coordinating discharge: over 30 minutes  SIGNED:  Emeterio Reeve DO [facility]

## 2022-01-30 NOTE — ED Triage Notes (Signed)
First Nurse: Pt here via [facility] with a fall today. Pt was dc this morning from the ED for seizures. Pt husband said he cannot care for pt at home and wants her placed. Pt is also altered at baseline. Pt was prescribed new medication for seizures but has not gotten it yet.  142/75 99% RA 102-cbg 73

## 2022-01-30 NOTE — Hospital Course (Signed)
Sandra Charles is a 66 y.o. female with medical history significant of seizure, Lewy body dementia, hypertension, hyperlipidemia, overweight obesity BMI 28.83, depression with anxiety, psychosis, who presents with seizure.  12/04: Per H&P "Per her son, patient had seizure at home. She had another seizure when she was doing CT scan in the hospital per ED physician. When I saw patient in ED, patient is confused, not oriented x 3.  Not following command.  She moves all extremities.  No facial droop.  Per her son at her normal baseline, patient recognizes family members, sometimes orientated to the place, sometimes oriented to time." WBC 14.8, valproic acid level <10, GFR> 60, afebrile, BP 165/91, heart rate 98, RR 24, SpO2 97% RA. CT head (+)Small age indeterminate infarct in the right caudate, no hemorrhage.   Dr. Leonel Ramsay of neurology is consulted. MRI brain ordered. Continue home lamotrigine 125 mg bid and follow levels, continue home Depakote 250 mg bid, if unable to take po ok for Keppra 1g bid (this was ordered by admitting physician), increase Onfi to 5 mg bid. CVA w/u only if (+)MRI.  12/05: VSS, WBC WNL. MRI brain "Moderately motion limited study without evidence of acute abnormality"  Consultants:  Neurology   Procedures: ***      ASSESSMENT & PLAN:   Principal Problem:   Seizure ([place]) Active Problems:   Acute metabolic encephalopathy   Stroke [facility])   Essential hypertension   HLD (hyperlipidemia)   Depression with anxiety   Lewy body dementia ([place])   Leukocytosis   Alcohol use   Overweight (BMI 25.0-29.9)       DVT prophylaxis: *** Pertinent IV fluids/nutrition: *** Central lines / invasive devices: ***  Code Status: *** Family Communication: ***  Disposition: *** TOC needs: *** Barriers to discharge / significant pending items: ***

## 2022-01-30 NOTE — Progress Notes (Signed)
Pt discharged home with son. PIV and tele removed. Discharge packet reviewed with patient son, [name], verbalizes understanding.

## 2022-01-30 NOTE — ED Provider Notes (Signed)
[facility] Provider Note    Event Date/Time   First MD Initiated Contact with Patient 01/30/22 2014     (approximate)   History   Altered Mental Status   HPI  Sandra Charles is a 66 y.o. female who was discharged from the hospital today.  She was given prescriptions for different antiseizure medications but has not had a chance to have these filled at this time.  She may have had another seizure per family description and is confused again and wondering and not acting normally.  Family cannot take care of her at home the way she is now.      Physical Exam   Triage Vital Signs: ED Triage Vitals  Enc Vitals Group     BP 01/30/22 1751 (!) 152/77     Pulse Rate 01/30/22 1751 68     Resp 01/30/22 1751 18     Temp 01/30/22 1751 97.8 F (36.6 C)     Temp Source 01/30/22 1751 Oral     SpO2 01/30/22 1751 96 %     Weight 01/30/22 1752 152 lb 8.9 oz (69.2 kg)     Height 01/30/22 1752 '5\' 1"'$  (1.549 m)     Head Circumference --      Peak Flow --      Pain Score 01/30/22 1752 0     Pain Loc --      Pain Edu? --      Excl. in [place]? --     Most recent vital signs: Vitals:   01/30/22 2200 01/30/22 2222  BP:    Pulse: 65   Resp: 19   Temp:  98.1 F (36.7 C)  SpO2: 96%      General: Awake, no distress.  Somewhat confused and having some difficulty following instructions.   CV:  Good peripheral perfusion.  Heart regular rate and rhythm no audible murmurs Resp:  Normal effort.  Lungs are clear Abd:  No distention.  Soft and nontender Neuro exam: Cranial nerves II through XII appear to be intact although visual fields were not checked.  Finger-nose is normal rapid alternating movements and hands are normal heel-to-shin is normal motor strength is 5/5 throughout and the patient does not report any numbness   ED Results / Procedures / Treatments   Labs (all labs ordered are listed, but only abnormal results are displayed) Labs Reviewed  COMPREHENSIVE  METABOLIC PANEL - Abnormal; Notable for the following components:      Result Value   BUN 26 (*)    Creatinine, Ser 1.13 (*)    GFR, Estimated 54 (*)    All other components within normal limits  VALPROIC ACID LEVEL - Abnormal; Notable for the following components:   Valproic Acid Lvl <10 (*)    All other components within normal limits  URINALYSIS, COMPLETE (UACMP) WITH MICROSCOPIC - Abnormal; Notable for the following components:   Color, Urine YELLOW (*)    APPearance HAZY (*)    Specific Gravity, Urine 1.032 (*)    Ketones, ur 5 (*)    Protein, ur 30 (*)    All other components within normal limits  URINE DRUG SCREEN, QUALITATIVE ([facility] ONLY) - Abnormal; Notable for the following components:   Cannabinoid 50 Ng, Ur [place] POSITIVE (*)    Benzodiazepine, Ur Scrn POSITIVE (*)    All other components within normal limits  CULTURE, BLOOD (ROUTINE X 2)  CULTURE, BLOOD (ROUTINE X 2)  CBC  ETHANOL  BASIC  METABOLIC PANEL  CBC  CBG MONITORING, ED     EKG  EKG read interpreted by me shows normal sinus rhythm rate of 67 normal axis some mild nonspecific ST-T changes and poor baseline   RADIOLOGY Chest x-ray read by radiology reviewed by me shows no acute disease   PROCEDURES:  Critical Care performed:   Procedures   MEDICATIONS ORDERED IN ED: Medications  aspirin EC tablet 81 mg (81 mg Oral Given 01/30/22 2219)  meloxicam (MOBIC) tablet 7.5 mg (7.5 mg Oral Given 01/30/22 2222)  amLODipine (NORVASC) tablet 10 mg (10 mg Oral Given 01/30/22 2218)  propranolol ER (INDERAL LA) 24 hr capsule 60 mg (60 mg Oral Given 01/30/22 2222)  rosuvastatin (CRESTOR) tablet 20 mg (20 mg Oral Given 01/30/22 2222)  donepezil (ARICEPT) tablet 10 mg (10 mg Oral Given 01/30/22 2219)  QUEtiapine (SEROQUEL) tablet 50 mg (50 mg Oral Given 01/30/22 2218)  sertraline (ZOLOFT) tablet 100 mg (100 mg Oral Given 01/30/22 2218)  traZODone (DESYREL) tablet 100 mg (has no administration in time range)  pantoprazole  (PROTONIX) EC tablet 40 mg (40 mg Oral Given 01/30/22 2218)  cloBAZam (ONFI) tablet 5 mg (5 mg Oral Given 01/30/22 2219)  lamoTRIgine (LAMICTAL) tablet 100 mg (100 mg Oral Given 01/30/22 2218)  lamoTRIgine (LAMICTAL) tablet 25 mg (25 mg Oral Given 01/30/22 2221)  enoxaparin (LOVENOX) injection 40 mg (40 mg Subcutaneous Given 01/30/22 2217)  0.9 %  sodium chloride infusion (has no administration in time range)  acetaminophen (TYLENOL) tablet 650 mg (has no administration in time range)    Or  acetaminophen (TYLENOL) suppository 650 mg (has no administration in time range)  magnesium hydroxide (MILK OF MAGNESIA) suspension 30 mL (has no administration in time range)  ondansetron (ZOFRAN) tablet 4 mg (has no administration in time range)    Or  ondansetron (ZOFRAN) injection 4 mg (has no administration in time range)  sodium chloride 0.9 % bolus 1,000 mL (1,000 mLs Intravenous New Bag/Given 01/30/22 2221)     IMPRESSION / MDM / [place] / ED COURSE  I reviewed the triage vital signs and the nursing notes. Patient with altered mental status likely due to seizures that family describes.  Family cannot take care of her currently the way she is.  She is wondering at home and doing things that are not safe.  Dr. Gayla Medicus asked me to come at her so I did for this reason.  Differential diagnosis includes, but is not limited to, possible postictal state, recurrence of schizoaffective disorder or psychosis, complications related to her known baseline memory disorder.  Patient's presentation is most consistent with acute presentation with potential threat to life or bodily function.  The patient is on the cardiac monitor to evaluate for evidence of arrhythmia and/or significant heart rate changes.  None have been seen   Patient's niece Glenna Fellows phone number is [phone].  Jerlyn Pain is to send [phone].   Discussed patient in detail with Dr. Trixie Rude.  He will come see the  patient.  FINAL CLINICAL IMPRESSION(S) / ED DIAGNOSES   Final diagnoses:  Altered mental status, unspecified altered mental status type     Rx / DC Orders   ED Discharge Orders     None        Note:  This document was prepared using Dragon voice recognition software and may include unintentional dictation errors.   Nena Polio, MD 01/30/22 2325

## 2022-01-30 NOTE — Assessment & Plan Note (Signed)
-   We will continue statin therapy. 

## 2022-01-31 ENCOUNTER — Encounter: Payer: Self-pay | Admitting: Family Medicine

## 2022-01-31 DIAGNOSIS — E663 Overweight: Secondary | ICD-10-CM | POA: Diagnosis present

## 2022-01-31 DIAGNOSIS — R059 Cough, unspecified: Secondary | ICD-10-CM | POA: Diagnosis present

## 2022-01-31 DIAGNOSIS — Z9181 History of falling: Secondary | ICD-10-CM | POA: Diagnosis not present

## 2022-01-31 DIAGNOSIS — R062 Wheezing: Secondary | ICD-10-CM | POA: Diagnosis present

## 2022-01-31 DIAGNOSIS — Z6828 Body mass index (BMI) 28.0-28.9, adult: Secondary | ICD-10-CM | POA: Diagnosis not present

## 2022-01-31 DIAGNOSIS — G40909 Epilepsy, unspecified, not intractable, without status epilepticus: Secondary | ICD-10-CM | POA: Diagnosis present

## 2022-01-31 DIAGNOSIS — D72829 Elevated white blood cell count, unspecified: Secondary | ICD-10-CM | POA: Diagnosis present

## 2022-01-31 DIAGNOSIS — E785 Hyperlipidemia, unspecified: Secondary | ICD-10-CM | POA: Diagnosis present

## 2022-01-31 DIAGNOSIS — G9341 Metabolic encephalopathy: Secondary | ICD-10-CM | POA: Diagnosis present

## 2022-01-31 DIAGNOSIS — F05 Delirium due to known physiological condition: Secondary | ICD-10-CM | POA: Diagnosis present

## 2022-01-31 DIAGNOSIS — Z87891 Personal history of nicotine dependence: Secondary | ICD-10-CM | POA: Diagnosis not present

## 2022-01-31 DIAGNOSIS — T50916A Underdosing of multiple unspecified drugs, medicaments and biological substances, initial encounter: Secondary | ICD-10-CM | POA: Diagnosis present

## 2022-01-31 DIAGNOSIS — R262 Difficulty in walking, not elsewhere classified: Secondary | ICD-10-CM | POA: Diagnosis present

## 2022-01-31 DIAGNOSIS — Z8249 Family history of ischemic heart disease and other diseases of the circulatory system: Secondary | ICD-10-CM | POA: Diagnosis not present

## 2022-01-31 DIAGNOSIS — G9340 Encephalopathy, unspecified: Secondary | ICD-10-CM | POA: Diagnosis present

## 2022-01-31 DIAGNOSIS — Z7982 Long term (current) use of aspirin: Secondary | ICD-10-CM | POA: Diagnosis not present

## 2022-01-31 DIAGNOSIS — Z79899 Other long term (current) drug therapy: Secondary | ICD-10-CM | POA: Diagnosis not present

## 2022-01-31 DIAGNOSIS — Z7985 Long-term (current) use of injectable non-insulin antidiabetic drugs: Secondary | ICD-10-CM | POA: Diagnosis not present

## 2022-01-31 DIAGNOSIS — I10 Essential (primary) hypertension: Secondary | ICD-10-CM | POA: Diagnosis present

## 2022-01-31 DIAGNOSIS — G3183 Dementia with Lewy bodies: Secondary | ICD-10-CM | POA: Diagnosis present

## 2022-01-31 DIAGNOSIS — F250 Schizoaffective disorder, bipolar type: Secondary | ICD-10-CM | POA: Diagnosis present

## 2022-01-31 DIAGNOSIS — Z8673 Personal history of transient ischemic attack (TIA), and cerebral infarction without residual deficits: Secondary | ICD-10-CM | POA: Diagnosis not present

## 2022-01-31 DIAGNOSIS — K219 Gastro-esophageal reflux disease without esophagitis: Secondary | ICD-10-CM | POA: Diagnosis present

## 2022-01-31 DIAGNOSIS — F0284 Dementia in other diseases classified elsewhere, unspecified severity, with anxiety: Secondary | ICD-10-CM | POA: Diagnosis present

## 2022-01-31 DIAGNOSIS — R32 Unspecified urinary incontinence: Secondary | ICD-10-CM | POA: Diagnosis present

## 2022-01-31 LAB — CBC
HCT: 43.6 % (ref 36.0–46.0)
Hemoglobin: 13.8 g/dL (ref 12.0–15.0)
MCH: 27.5 pg (ref 26.0–34.0)
MCHC: 31.7 g/dL (ref 30.0–36.0)
MCV: 86.9 fL (ref 80.0–100.0)
Platelets: 226 10*3/uL (ref 150–400)
RBC: 5.02 MIL/uL (ref 3.87–5.11)
RDW: 14.7 % (ref 11.5–15.5)
WBC: 9.1 10*3/uL (ref 4.0–10.5)
nRBC: 0 % (ref 0.0–0.2)

## 2022-01-31 LAB — BASIC METABOLIC PANEL
Anion gap: 7 (ref 5–15)
BUN: 23 mg/dL (ref 8–23)
CO2: 26 mmol/L (ref 22–32)
Calcium: 9.2 mg/dL (ref 8.9–10.3)
Chloride: 105 mmol/L (ref 98–111)
Creatinine, Ser: 0.91 mg/dL (ref 0.44–1.00)
GFR, Estimated: >60 mL/min (ref >60)
Glucose, Bld: 89 mg/dL (ref 70–99)
Potassium: 3.6 mmol/L (ref 3.5–5.1)
Sodium: 138 mmol/L (ref 135–145)

## 2022-01-31 MED ORDER — HALOPERIDOL LACTATE 5 MG/ML IJ SOLN: 2.5000 mg | Freq: Once | INTRAMUSCULAR | Status: DC

## 2022-01-31 MED ORDER — HALOPERIDOL LACTATE 5 MG/ML IJ SOLN
2.5000 mg | INTRAMUSCULAR | Status: AC
  Administered 2022-01-31: 2.5 mg via INTRAVENOUS

## 2022-01-31 MED ORDER — HALOPERIDOL LACTATE 5 MG/ML IJ SOLN
INTRAMUSCULAR | Status: AC
  Administered 2022-01-31: 2.5 mg via INTRAVENOUS
  Filled 2022-01-31: qty 1

## 2022-01-31 NOTE — ED Notes (Signed)
Pt bedding and brief changed. Pt placed on pure wick to void.

## 2022-01-31 NOTE — ED Notes (Signed)
Pt ambulated to bathroom with assistance.

## 2022-01-31 NOTE — Progress Notes (Signed)
[facility] Progress Note  Patient: Sandra Charles    OVF:[id number]  DOA: 01/30/2022     Date of Service: the patient was seen and examined on 01/31/2022  Chief Complaint  Patient presents with   Altered Mental Status   Brief hospital course: HYE TRAWICK is a 66 y.o. African-American female with medical history significant for seizure, Lewy body dementia, hypertension, hyperlipidemia, overweight obesity BMI 28.83, depression with anxiety, psychosis, who presented to the emergency room with acute onset of altered mental status with extreme incoherence.  Her husband.  She was just admitted here for seizure and postictal confusion and had an essentially negative brain MRI.  She was given IV Keppra and her Depakote was continued as well as her Lamictal and her Onfi was increased to 5 mg p.o. twice daily per Dr. Leonel Ramsay.  She was discharged home earlier today.  There has been stated that she had a hard time walking and her balance was off.  Per his report she is highly prone to falling.  He stated that she fell on almost hit herself against a edge of the dresser that ''could have killed her''.  The patient admits to urinary frequency and dysuria with associated urinary incontinence.  No hematuria or flank pain.  She has not been eating or drinking much today.  No cough or wheezing or dyspnea.  No chest pain or palpitations.  No paresthesias or focal muscle weakness.  She has mild dry cough with wheezing without dyspnea.  No nausea or vomiting or abdominal pain.  No melena or bright red bleeding per rectum.  No other bleeding diathesis..   ED Course: Upon presentation to the emergency room BP was 149/86 with heart rate of 108 and later BP was 165/91.  Labs revealed a BUN of 26 and creatinine 1.13 compared to 14 and 0.8 yesterday. EKG: SR no significant changes Imaging: Portable chest x-ray no acute chest findings  Assessment and Plan: * Acute encephalopathy - Given her cough and wheezing will  obtain a portable chest x-ray. - Given her urinary frequency and dysuria will obtain urinalysis. - We will place on hydration with IV normal saline. - We will continue monitoring neurochecks every 4 hours for 24 hours. - We will continue one-on-one monitoring given her recent fall.   Seizure disorder ([place]) - We will continue Lamictal 125 mg p.o. twice daily and clobazam 5 mg p.o. nightly. - She will be placed on seizure precautions.   Essential hypertension -  continue propranolol ER and Norvasc.   Schizoaffective disorder, bipolar type ([place]) - continue Zoloft. Hold Seroquel due to confusion and disorientation  GERD without esophagitis - We will continue PPI therapy.   Dyslipidemia - We will continue statin therapy.  Body mass index is 28.83 kg/m.  Interventions:   Diet: Heart healthy diet DVT Prophylaxis: Subcutaneous Lovenox   Advance goals of care discussion: Full code  Family Communication: family was NOT present at bedside, at the time of interview.  The pt provided permission to discuss medical plan with the family. Opportunity was given to ask question and all questions were answered satisfactorily.   Disposition:  Pt is from Home, admitted with MAS and Fall, still has AMS, which precludes a safe discharge. Discharge to Home, when stable, need to be seen by PT and OT.  Subjective: No significant events overnight, patient was admitted due to confusion and altered mental status, still patient is AAO x2 and slightly confused, not very alert. Patient is unable to  offer any complaints, denied any chest pain or palpitation, no shortness of breath, no headache or dizziness.   Physical Exam: General:   NAD, AO x2, confused Appear in no distress, affect depressed Eyes: PERRLA ENT: Oral Mucosa Clear, moist  Neck: no JVD,  Cardiovascular: S1 and S2 Present, no Murmur,  Respiratory: good respiratory effort, Bilateral Air entry equal and Decreased, no Crackles, no  wheezes Abdomen: Bowel Sound present, Soft and no tenderness,  Skin: no rashes Extremities: no Pedal edema, no calf tenderness Neurologic: without any new focal findings Gait not checked due to patient safety concerns  Vitals:   01/31/22 1257 01/31/22 1315 01/31/22 1430 01/31/22 1640  BP: (!) 82/53 (!) 145/62 (!) 110/57 131/60  Pulse: (!) 54 65 64 75  Resp:  '18 16 19  '$ Temp:    99 F (37.2 C)  TempSrc:    Oral  SpO2:  97% 96% 97%  Weight:      Height:       No intake or output data in the 24 hours ending 01/31/22 1732 Filed Weights   01/30/22 1752  Weight: 69.2 kg    Data Reviewed: I have personally reviewed and interpreted daily labs, tele strips, imagings as discussed above. I reviewed all nursing notes, pharmacy notes, vitals, pertinent old records I have discussed plan of care as described above with RN and patient/family.  CBC: Recent Labs  Lab 01/29/22 1220 01/30/22 0544 01/30/22 1758 01/31/22 0431  WBC 14.8* 8.5 8.9 9.1  NEUTROABS 12.8*  --   --   --   HGB 13.3 12.7 13.4 13.8  HCT 41.1 39.7 41.6 43.6  MCV 84.6 83.9 85.1 86.9  PLT 256 227 252 427   Basic Metabolic Panel: Recent Labs  Lab 01/29/22 1220 01/29/22 1552 01/30/22 1758 01/31/22 0431  NA 136  --  137 138  K 4.2  --  4.2 3.6  CL 102  --  99 105  CO2 28  --  29 26  GLUCOSE 100*  --  96 89  BUN 14  --  26* 23  CREATININE 0.80  --  1.13* 0.91  CALCIUM 9.2  --  9.9 9.2  MG  --  2.1  --   --     Studies: DG Chest Port 1 View  Result Date: 01/30/2022 CLINICAL DATA:  Cough and altered mental status. EXAM: PORTABLE CHEST 1 VIEW COMPARISON:  05/01/2021. FINDINGS: The cardiomediastinal contours are normal. The lungs are clear. Pulmonary vasculature is normal. No consolidation, pleural effusion, or pneumothorax. No acute osseous abnormalities are seen. IMPRESSION: No acute chest findings. Electronically Signed   By: Keith Rake M.D.   On: 01/30/2022 21:49    Scheduled Meds:  amLODipine  10 mg  Oral Daily   aspirin EC  81 mg Oral Daily   cloBAZam  5 mg Oral QHS   donepezil  10 mg Oral QHS   enoxaparin (LOVENOX) injection  40 mg Subcutaneous Q24H   lamoTRIgine  100 mg Oral BID   lamoTRIgine  25 mg Oral BID   meloxicam  7.5 mg Oral Daily   pantoprazole  40 mg Oral Daily   propranolol ER  60 mg Oral Daily   rosuvastatin  20 mg Oral QHS   sertraline  100 mg Oral Daily   Continuous Infusions:  sodium chloride 100 mL/hr at 01/31/22 1701   PRN Meds: acetaminophen **OR** acetaminophen, magnesium hydroxide, ondansetron **OR** ondansetron (ZOFRAN) IV, traZODone  Time spent: 35 minutes  Author: Val Riles.  MD [facility] 01/31/2022 5:32 PM  To reach On-call, see care teams to locate the attending and reach out to them via [email or web address]. If 7PM-7AM, please contact night-coverage If you still have difficulty reaching the attending provider, please page the [facility] (Director on Call) for [facility] on amion for assistance.

## 2022-01-31 NOTE — Evaluation (Signed)
Occupational Therapy Evaluation Patient Details Name: Sandra Charles MRN: [id number] DOB: 06-Apr-1955 Today's Date: 01/31/2022   History of Present Illness Sandra Charles is a 66 y.o. African-American female with medical history significant for seizure, Lewy body dementia, hypertension, hyperlipidemia, overweight obesity BMI 28.83, depression with anxiety, psychosis, who presented to the emergency room with acute onset of altered mental status with extreme incoherence.   Clinical Impression   Patient presenting with decreased independence in self care, balance, functional mobility/transfers, and endurance. Pt is a poor historian 2/2 baseline cognitive deficits, spoke with son on the phone to confirm PLOF. Pt oriented to self and location during evaluation. Pt currently functioning at supervision for bed mobility, Min guard for functional mobility to the bathroom using RW, Min guard for toilet transfer, supervision for peri care in sitting, and Min A for clothing management in standing. Patient will benefit from acute OT to increase overall independence in the areas of ADLs and functional mobility in order to safely discharge home. Pt could benefit from [facility] following D/C to decrease falls risk, improve balance, and maximize independence in self-care within own home environment.    Recommendations for follow up therapy are one component of a multi-disciplinary discharge planning process, led by the attending physician.  Recommendations may be updated based on patient status, additional functional criteria and insurance authorization.   Follow Up Recommendations  Home health OT     Assistance Recommended at Discharge Frequent or constant Supervision/Assistance   Patient can return home with the following A little help with walking and/or transfers;A little help with bathing/dressing/bathroom;Assistance with cooking/housework;Assist for transportation;Direct supervision/assist for financial  management;Direct supervision/assist for medications management;Help with stairs or ramp for entrance    Functional Status Assessment  Patient has had a recent decline in their functional status and demonstrates the ability to make significant improvements in function in a reasonable and predictable amount of time.  Equipment Recommendations  None recommended by OT    Recommendations for Other Services       Precautions / Restrictions Precautions Precautions: Fall Restrictions Weight Bearing Restrictions: No      Mobility Bed Mobility Overal bed mobility: Needs Assistance Bed Mobility: Supine to Sit, Sit to Supine     Supine to sit: Supervision, HOB elevated Sit to supine: Supervision   General bed mobility comments: VC to scoot hips forward at EOB    Transfers Overall transfer level: Needs assistance Equipment used: Rolling walker (2 wheels) Transfers: Sit to/from Stand Sit to Stand: Min guard           General transfer comment: STS from EOB and toilet      Balance Overall balance assessment: Needs assistance Sitting-balance support: Feet supported Sitting balance-Leahy Scale: Good     Standing balance support: Bilateral upper extremity supported, No upper extremity supported, During functional activity Standing balance-Leahy Scale: Fair                             ADL either performed or assessed with clinical judgement   ADL Overall ADL's : Needs assistance/impaired     Grooming: Wash/dry hands;Supervision/safety;Standing           Upper Body Dressing : Minimal assistance;Cueing for sequencing;Bed level       Toilet Transfer: Ambulation;Rolling walker (2 wheels);Min guard Toilet Transfer Details (indicate cue type and reason): BSC frame placed over toilet Toileting- Clothing Manipulation and Hygiene: Sit to/from stand;Supervision/safety;Sitting/lateral lean;Minimal assistance Toileting - Clothing Manipulation Details (  indicate cue  type and reason): peri care in sitting with supervision, Min A for clothing management in standing 2/2 difficulty with securing brief     Functional mobility during ADLs: Min guard;Rolling walker (2 wheels) (to the bathroom)       Vision Patient Visual Report: No change from baseline       Perception     Praxis      Pertinent Vitals/Pain Pain Assessment Pain Assessment: No/denies pain     Hand Dominance     Extremity/Trunk Assessment Upper Extremity Assessment Upper Extremity Assessment: Generalized weakness   Lower Extremity Assessment Lower Extremity Assessment: Generalized weakness       Communication Communication Communication: No difficulties   Cognition Arousal/Alertness: Awake/alert Behavior During Therapy: Flat affect Overall Cognitive Status: History of cognitive impairments - at baseline                                 General Comments: History of Lewy body dementia at baseline, oriented to self and location (son reports is baseline and can also recognize family), followed single step commands well     General Comments       Exercises Other Exercises Other Exercises: OT provided education re: role of OT, OT POC, post acute recs, sitting up for all meals, EOB/OOB mobility with assistance, home/fall safety.     Shoulder Instructions      Home Living Family/patient expects to be discharged to:: Private residence Living Arrangements: Spouse/significant other Available Help at Discharge: Family (husband, son, cousin rotate in and out to provide close to 24/7 supervision) Type of Home: House Home Access: Ramped entrance     Home Layout: One level     Bathroom Shower/Tub: Occupational psychologist: Standard     Home Equipment: Conservation officer, nature (2 wheels);BSC/3in1   Additional Comments: Pt poor historian, spoke with son on the phone to obtain PLOF.      Prior Functioning/Environment Prior Level of Function :  Independent/Modified Independent             Mobility Comments: normally independent with no AD, uses RW as needed ADLs Comments: normally independent but family have been assisting with ADLs/IADLs as needed, husband provides transporation        OT Problem List: Decreased strength;Decreased activity tolerance;Impaired balance (sitting and/or standing);Decreased cognition;Decreased safety awareness;Decreased knowledge of use of DME or AE      OT Treatment/Interventions: Self-care/ADL training;Therapeutic exercise;Therapeutic activities;Cognitive remediation/compensation;Energy conservation;DME and/or AE instruction;Patient/family education;Balance training    OT Goals(Current goals can be found in the care plan section) Acute Rehab OT Goals Patient Stated Goal: go home OT Goal Formulation: With patient/family Time For Goal Achievement: 02/14/22 Potential to Achieve Goals: Good   OT Frequency: Min 2X/week    Co-evaluation              AM-PAC OT "6 Clicks" Daily Activity     Outcome Measure Help from another person eating meals?: None Help from another person taking care of personal grooming?: A Little Help from another person toileting, which includes using toliet, bedpan, or urinal?: A Little Help from another person bathing (including washing, rinsing, drying)?: A Little Help from another person to put on and taking off regular upper body clothing?: A Little Help from another person to put on and taking off regular lower body clothing?: A Little 6 Click Score: 19   End of Session Equipment Utilized During Treatment: Gait  belt;Rolling walker (2 wheels) Nurse Communication: Mobility status  Activity Tolerance: Patient tolerated treatment well Patient left: in bed;with call bell/phone within reach;with bed alarm set  OT Visit Diagnosis: Unsteadiness on feet (R26.81);Muscle weakness (generalized) (M62.81);Other symptoms and signs involving cognitive function                 Time: 4458-4835 OT Time Calculation (min): 27 min Charges:  OT General Charges $OT Visit: 1 Visit OT Evaluation $OT Eval Moderate Complexity: 1 Mod  [facility] MS, OTR/L ascom [phone]  01/31/22, 5:59 PM

## 2022-01-31 NOTE — ED Notes (Signed)
Pt awake and ambulated to bathroom with assistance.

## 2022-01-31 NOTE — Progress Notes (Signed)
       CROSS COVER NOTE  NAME: ARLEATHA PHILIPPS MRN: [id number] DOB : [birth year]    Date of Service   01/31/2022  HPI/Events of Note   Nurse reports patient confused, agitated and combative  Assessment and  Interventions   Assessment: Bedside patient is alert to self Confused to place and situation.  Pulling at IV line and trying to get out of bed. Not initially able to redirect. Repeatedly calling out for "Mikki Santee".  Plan: Haldol 2.5 mg ordered x1 IV       Kathlene Cote NP [facility]

## 2022-01-31 NOTE — Progress Notes (Signed)
OT Cancellation Note  Patient Details Name: Sandra Charles MRN: [id number] DOB: [birth year]   Cancelled Treatment:    Reason Eval/Treat Not Completed: Patient's level of consciousness;Medical issues which prohibited therapy. OT orders received, chart reviewed. Spoke with RN and PT. Pt with decreased arousal/alertness limiting participation in activity. Vitals showed MAP at 62 and low BP with systolic in 19J. Will re-attempt evaluation as able and pt medically appropriate.   Doneta Public 01/31/2022, 1:55 PM

## 2022-01-31 NOTE — Progress Notes (Signed)
PT Cancellation Note  Patient Details Name: LYRICK WORLAND MRN: [id number] DOB: November 04, 1955   Cancelled Treatment:    Reason Eval/Treat Not Completed: Patient's level of consciousness (Chart reviewed, RN consulted. Attempted evlauation. Pt asleep in room, no responsive to voice, gentle touch. prearrival vitals show MAP: 62, author assesses again with BP lower now, SBP in 80s. RN made aware.) Will attempt evaluation again at later date/time.   1:02 PM, 01/31/22 Etta Grandchild, PT, DPT Physical Therapist - [facility]  [phone] ([place])    Worley Radermacher C 01/31/2022, 1:02 PM

## 2022-01-31 NOTE — ED Notes (Signed)
Pt transported to Room 111

## 2022-01-31 NOTE — ED Notes (Signed)
Patient received her breakfast meal. Patient was alert and orient. We set patient up to eat and RN was in the room to give meds.

## 2022-02-01 NOTE — Discharge Summary (Signed)
[facility] Discharge Summary   Patient: Sandra Charles FKC:[id number]  PCP: Ashley Jacobs, MD  Date of admission: 01/30/2022   Date of discharge:  02/01/2022     Discharge Diagnoses:  Principal Problem:   Acute encephalopathy Active Problems:   Acute metabolic encephalopathy   Seizure disorder ([place])   Essential hypertension   Schizoaffective disorder, bipolar type ([place])   Dyslipidemia   GERD without esophagitis   Admitted From: ome Disposition:  Home with home health services  Recommendations for Outpatient Follow-up:  PCP: in 1 wk Neurology in 1 week Follow up LABS/TEST:     Diet recommendation: Cardiac diet  Activity: The patient is advised to gradually reintroduce usual activities, as tolerated  Discharge Condition: stable  Code Status: Full code   History of present illness: As per the H and P dictated on admission Hospital Course:  Sandra Charles is a 66 y.o. African-American female with medical history significant for seizure, Lewy body dementia, hypertension, hyperlipidemia, overweight obesity BMI 28.83, depression with anxiety, psychosis, who presented to the emergency room with acute onset of altered mental status with extreme incoherence.  Her husband.  She was just admitted here for seizure and postictal confusion and had an essentially negative brain MRI.  She was given IV Keppra and her Depakote was continued as well as her Lamictal and her Onfi was increased to 5 mg p.o. twice daily per Dr. Leonel Ramsay.  She was discharged home on 01/30/22 today.  Patient had difficulty walking and off balance, highly prone to fall, patient's husband stated that she almost hit her head to the edge of the dresser and that could have killed her.  So patient was admitted again for further management as below. ED Course: Upon presentation to the emergency room BP was 149/86 with heart rate of 108 and later BP was 165/91.  Labs revealed a BUN of 26 and creatinine 1.13 compared to 14  and 0.8 yesterday. EKG: SR no significant changes Imaging: Portable chest x-ray no acute chest findings   Assessment and Plan: # Acute encephalopathy, resolved Patient has dementia, schizoaffective disorder and seizure disorder which might be contributing to patient's mental status.  CXR negative for any infection. Given her cough and wheezing will obtain a portable chest x-ray.  Blood culture negative.  UA was negative.  Patient's condition improved and currently back to her baseline. # Seizure disorder, continued Lamictal 125 mg p.o. twice daily and clobazam 5 mg p.o. nightly.  Patient was kept on seizure precautions, no seizures during hospital stay.  Neurology was consulted, recommended continue current treatment, no further workup needed.  Follow-up as an outpatient. # Essential hypertension: continue propranolol ER and Norvasc. # Schizoaffective disorder, bipolar type, continue Zoloft and Seroquel home dose # GERD without esophagitis, continue PPI therapy. # Dyslipidemia, continue statin therapy. Body mass index is 28.83 kg/m.  Interventions:   Patient was seen by physical therapy, who recommended Home health,  which was arranged. On the day of the discharge the patient's vitals were stable, and no other acute medical condition were reported by patient. the patient was felt safe to be discharge at Home with Home health.  Consultants: Neurology  Procedures: None  Discharge Exam: General: Appear in no distress, no Rash; Oral Mucosa Clear, moist. Cardiovascular: S1 and S2 Present, no Murmur, Respiratory: normal respiratory effort, Bilateral Air entry present and no Crackles, no wheezes Abdomen: Bowel Sound present, Soft and no tenderness, no hernia Extremities: no Pedal edema, no calf tenderness Neurology:  alert and oriented to place and person affect appropriate.  Filed Weights   01/30/22 1752  Weight: 69.2 kg   Vitals:   02/01/22 0406 02/01/22 0728  BP: (!) 143/60 (!)  148/58  Pulse: 79 73  Resp: 18 18  Temp: 97.6 F (36.4 C) 98.1 F (36.7 C)  SpO2: 97% 97%    DISCHARGE MEDICATION: Allergies as of 02/01/2022       Reactions   Lovastatin Other (See Comments)   Unknown reaction        Medication List     STOP taking these medications    ibuprofen 800 MG tablet Commonly known as: ADVIL       TAKE these medications    amLODipine 10 MG tablet Commonly known as: NORVASC Take 10 mg by mouth daily. For high blood pressure   aspirin EC 81 MG tablet Take 81 mg by mouth daily. Swallow whole.   cloBAZam 10 MG tablet Commonly known as: Onfi Take 0.5 tablets (5 mg total) by mouth at bedtime.   cyanocobalamin 1000 MCG tablet Commonly known as: VITAMIN B12 Take 1,000 mcg by mouth daily.   donepezil 10 MG tablet Commonly known as: ARICEPT Take 10 mg by mouth at bedtime.   lamoTRIgine 100 MG tablet Commonly known as: LAMICTAL Take 1 tablet (100 mg total) by mouth 2 (two) times daily. Along with 25 mg tablet for total of 125 mg bid.   lamoTRIgine 25 MG tablet Commonly known as: LAMICTAL Take 1 tablet (25 mg total) by mouth 2 (two) times daily. Along with 100 mg tablet for total of 125 mg bid.   meloxicam 7.5 MG tablet Commonly known as: MOBIC Take 7.5 mg by mouth daily.   Ozempic (1 MG/DOSE) 4 MG/3ML Sopn Generic drug: Semaglutide (1 MG/DOSE) Inject into the skin. Pt taking on fridays   pantoprazole 40 MG tablet Commonly known as: Protonix Take 1 tablet (40 mg total) by mouth daily.   propranolol ER 60 MG 24 hr capsule Commonly known as: INDERAL LA Take 60 mg by mouth daily.   QUEtiapine 50 MG tablet Commonly known as: SEROQUEL Take 1 tablet (50 mg total) by mouth at bedtime.   rosuvastatin 20 MG tablet Commonly known as: CRESTOR Take 1 tablet (20 mg total) by mouth at bedtime.   sertraline 100 MG tablet Commonly known as: ZOLOFT Take 100 mg by mouth daily.   traZODone 100 MG tablet Commonly known as:  DESYREL Take 1 tablet (100 mg total) by mouth at bedtime as needed for sleep.       Allergies  Allergen Reactions   Lovastatin Other (See Comments)    Unknown reaction   Discharge Instructions     Call MD for:  difficulty breathing, headache or visual disturbances   Complete by: As directed    Call MD for:  extreme fatigue   Complete by: As directed    Call MD for:  persistant dizziness or light-headedness   Complete by: As directed    Call MD for:  persistant nausea and vomiting   Complete by: As directed    Call MD for:  redness, tenderness, or signs of infection (pain, swelling, redness, odor or green/yellow discharge around incision site)   Complete by: As directed    Call MD for:  severe uncontrolled pain   Complete by: As directed    Call MD for:  temperature >100.4   Complete by: As directed    Diet - low sodium heart healthy   Complete  by: As directed    Discharge instructions   Complete by: As directed    Follow-up with PCP in 1 week Follow-up with a neurologist in 1 to 2 weeks   Increase activity slowly   Complete by: As directed        The results of significant diagnostics from this hospitalization (including imaging, microbiology, ancillary and laboratory) are listed below for reference.    Significant Diagnostic Studies: DG Chest Port 1 View  Result Date: 01/30/2022 CLINICAL DATA:  Cough and altered mental status. EXAM: PORTABLE CHEST 1 VIEW COMPARISON:  05/01/2021. FINDINGS: The cardiomediastinal contours are normal. The lungs are clear. Pulmonary vasculature is normal. No consolidation, pleural effusion, or pneumothorax. No acute osseous abnormalities are seen. IMPRESSION: No acute chest findings. Electronically Signed   By: Keith Rake M.D.   On: 01/30/2022 21:49   MR BRAIN WO CONTRAST  Result Date: 01/29/2022 CLINICAL DATA:  Seizure disorder, clinical change EXAM: MRI HEAD WITHOUT CONTRAST TECHNIQUE: Multiplanar, multiecho pulse sequences of the  brain and surrounding structures were obtained without intravenous contrast. COMPARISON:  CT head 01/29/2022. MR head 08/26/2020. FINDINGS: Moderately motion limited study.  Within this limitation: Brain: No acute infarction, hemorrhage, hydrocephalus, extra-axial collection or mass lesion. Similar scattered T2/FLAIR hyperintensities in the white matter, nonspecific but compatible with chronic microvascular ischemic disease. Vascular: Major arterial flow voids are maintained at the skull base. Skull and upper cervical spine: Normal marrow signal. Sinuses/Orbits: Negative. Other: No mastoid effusions. IMPRESSION: Moderately motion limited study without evidence of acute abnormality. Electronically Signed   By: Margaretha Sheffield M.D.   On: 01/29/2022 16:50   CT Head Wo Contrast  Result Date: 01/29/2022 CLINICAL DATA:  Altered mental status EXAM: CT HEAD WITHOUT CONTRAST TECHNIQUE: Contiguous axial images were obtained from the base of the skull through the vertex without intravenous contrast. RADIATION DOSE REDUCTION: This exam was performed according to the departmental dose-optimization program which includes automated exposure control, adjustment of the mA and/or kV according to patient size and/or use of iterative reconstruction technique. 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 No hemorrhage. No extra-axial fluid collection. No hydrocephalus. Sequela of mild chronic microvascular ischemic change. Vascular: No hyperdense vessel or unexpected calcification. Skull: Normal. Negative for fracture or focal lesion. Sinuses/Orbits: No acute finding. Other: None. IMPRESSION: 1. 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 No hemorrhage. 2. Sequela of mild chronic microvascular ischemic change. Electronically Signed   By: Marin Roberts M.D.   On: 01/29/2022 13:40    Microbiology: Recent Results (from the past 240 hour(s))  Culture, blood (Routine  X 2) w Reflex to ID Panel     Status: None (Preliminary result)   Collection Time: 01/30/22 10:05 PM   Specimen: BLOOD  Result Value Ref Range Status   Specimen Description BLOOD RIGHT ANTECUBITAL  Final   Special Requests   Final    BOTTLES DRAWN AEROBIC AND ANAEROBIC Blood Culture adequate volume   Culture   Final    NO GROWTH 2 DAYS Performed at [facility], [address], [place], [place] [address]    Report Status PENDING  Incomplete  Culture, blood (Routine X 2) w Reflex to ID Panel     Status: None (Preliminary result)   Collection Time: 01/30/22 10:05 PM   Specimen: BLOOD  Result Value Ref Range Status   Specimen Description BLOOD LEFT ANTECUBITAL  Final   Special Requests   Final    BOTTLES  DRAWN AEROBIC AND ANAEROBIC Blood Culture adequate volume   Culture   Final    NO GROWTH 2 DAYS Performed at [facility], [address] [place], [place] [address]    Report Status PENDING  Incomplete     Labs: CBC: Recent Labs  Lab 01/29/22 1220 01/30/22 0544 01/30/22 1758 01/31/22 0431  WBC 14.8* 8.5 8.9 9.1  NEUTROABS 12.8*  --   --   --   HGB 13.3 12.7 13.4 13.8  HCT 41.1 39.7 41.6 43.6  MCV 84.6 83.9 85.1 86.9  PLT 256 227 252 604   Basic Metabolic Panel: Recent Labs  Lab 01/29/22 1220 01/29/22 1552 01/30/22 1758 01/31/22 0431  NA 136  --  137 138  K 4.2  --  4.2 3.6  CL 102  --  99 105  CO2 28  --  29 26  GLUCOSE 100*  --  96 89  BUN 14  --  26* 23  CREATININE 0.80  --  1.13* 0.91  CALCIUM 9.2  --  9.9 9.2  MG  --  2.1  --   --    Liver Function Tests: Recent Labs  Lab 01/29/22 1220 01/30/22 1758  AST 27 24  ALT 24 25  ALKPHOS 121 117  BILITOT 0.7 1.0  PROT 8.0 8.1  ALBUMIN 3.9 4.0   No results for input(s): "LIPASE", "AMYLASE" in the last 168 hours. No results for input(s): "AMMONIA" in the last 168 hours. Cardiac Enzymes: No results for input(s): "CKTOTAL", "CKMB", "CKMBINDEX", "TROPONINI" in the last 168 hours. BNP  (last 3 results) No results for input(s): "BNP" in the last 8760 hours. CBG: No results for input(s): "GLUCAP" in the last 168 hours.  Time spent: 35 minutes  Signed:  Val Riles  [facility]  02/01/2022 2:08 PM

## 2022-02-01 NOTE — Progress Notes (Signed)
Physical Therapy Treatment Patient Details Name: Sandra Charles MRN: [id number] DOB: 1955-08-15 Today's Date: 02/01/2022   History of Present Illness Sandra Charles is a 36yoF who [facility] after seizure at home. PMH: HTNm HLD, lewy body dementia, seizure d/o. Per EMS, patient had a witnessed generalized tonic-clonic seizure at home lasting 2 to 3 minutes. PT DC to home on 12/5 after neurology r/o CVA. Family called EMS same day for pt to return as mentation had not returned to baseline.      Pt in chair, cleaned up from earlier [facility] and has had a bath, agreeable PT assessment of AMB. Pt performs transfers and AMB with RW at supervision level, generally moving slow, but gait speed empirically appropriate for a household ambulator. Pt asked to rate symptoms Q55f, continually reports to feel fine and be near her baseline. Pt returned to recliner at [place] RN made aware of IV alarm.    Recommendations for follow up therapy are one component of a multi-disciplinary discharge planning process, led by the attending physician.  Recommendations may be updated based on patient status, additional functional criteria and insurance authorization.  Follow Up Recommendations  Home health PT     Assistance Recommended at Discharge Set up Supervision/Assistance  Patient can return home with the following A little help with walking and/or transfers;A little help with bathing/dressing/bathroom;Assist for transportation;Help with stairs or ramp for entrance;Assistance with cooking/housework   Equipment Recommendations  None recommended by PT    Recommendations for Other Services       Precautions / Restrictions Precautions Precautions: Fall Restrictions Weight Bearing Restrictions: No     Mobility  Bed Mobility Overal bed mobility: Needs Assistance Bed Mobility: Supine to Sit     Supine to sit: Supervision, HOB elevated     General bed mobility comments: in recliner at start and finish     Transfers Overall transfer level: Needs assistance Equipment used: Rolling walker (2 wheels), None Transfers: Sit to/from Stand Sit to Stand: Supervision Stand pivot transfers: Min assist         General transfer comment: assisting to commode to avoid contamination of self/RW/floor (bowel on gown/bed)    Ambulation/Gait Ambulation/Gait assistance: Supervision Gait Distance (Feet): 240 Feet Assistive device: Rolling walker (2 wheels) Gait Pattern/deviations: Step-through pattern Gait velocity: 0.440m     General Gait Details: slow, reduced trunk sway in gait, appears rigiid,   StChief Strategy Officer  Modified Rankin (Stroke Patients Only)       Balance Overall balance assessment: Modified Independent, Mild deficits observed, not formally tested, No apparent balance deficits (not formally assessed)                                          Cognition Arousal/Alertness: Awake/alert Behavior During Therapy: WFL for tasks assessed/performed Overall Cognitive Status: Within Functional Limits for tasks assessed                                 General Comments: History of Lewy body dementia at baseline, oriented to self and location, followed single step commands well        Exercises      General Comments        Pertinent Vitals/Pain Pain Assessment Pain  Assessment: No/denies pain    Home Living Family/patient expects to be discharged to:: Private residence Living Arrangements: Spouse/significant other Available Help at Discharge: Family Type of Home: House Home Access: [place]: One [place]: Conservation officer, nature (2 wheels);BSC/3in1 Additional Comments: Pt poor historian, spoke with son on the phone to obtain PLOF.    Prior Function            PT Goals (current goals can now be found in the care plan section) Acute Rehab PT Goals Patient Stated Goal: return  to home PT Goal Formulation: With patient Time For Goal Achievement: 02/15/22 Potential to Achieve Goals: Good Progress towards PT goals: Progressing toward goals    Frequency    Min 2X/week      PT Plan Current plan remains appropriate    Co-evaluation              AM-PAC PT "6 Clicks" Mobility   Outcome Measure  Help needed turning from your back to your side while in a flat bed without using bedrails?: None Help needed moving from lying on your back to sitting on the side of a flat bed without using bedrails?: None Help needed moving to and from a bed to a chair (including a wheelchair)?: None Help needed standing up from a chair using your arms (e.g., wheelchair or bedside chair)?: None Help needed to walk in hospital room?: A Little Help needed climbing 3-5 steps with a railing? : A Little 6 Click Score: 22    End of Session Equipment Utilized During Treatment: Gait belt Activity Tolerance: Patient tolerated treatment well;No increased pain Patient left: in chair;with call bell/phone within reach;with chair alarm set Nurse Communication: Mobility status PT Visit Diagnosis: Difficulty in walking, not elsewhere classified (R26.2);Other abnormalities of gait and mobility (R26.89);Other symptoms and signs involving the nervous system (R29.898)     Time: 1043-1100 PT Time Calculation (min) (ACUTE ONLY): 17 min  Charges:  $Therapeutic Activity: 8-22 mins                    11:16 AM, 02/01/22 Etta Grandchild, PT, DPT Physical Therapist - [facility]  [phone] ([place])    Ziyan Schoon C 02/01/2022, 11:13 AM

## 2022-02-01 NOTE — TOC Transition Note (Signed)
Transition of Care [facility]) - CM/SW Discharge Note   Patient Details  Name: LAVONE BARRIENTES MRN: [id number] Date of Birth: Mar 09, 1955  [facility]) CM/SW Contact:  Colen Darling, [place] Phone Number: 02/01/2022, 2:58 PM   Clinical Narrative:     Patient accepted to [place] for PT/OT.  Final next level of care: [place]     Patient Goals and CMS Choice    [facility]    Discharge Placement                Patient to be transferred to facility by: Family   Patient and family notified of of transfer: 02/01/22  Discharge Plan and Services                          HH Arranged: PT, OT [facility] Agency: [facility] [place] Date [place]: 02/01/22 Time [place]: [place] Representative spoke with at [place]: [place] (SDOH) Interventions     Readmission Risk Interventions     No data to display

## 2022-02-01 NOTE — Progress Notes (Signed)
Discharge instructions reviewed with patient including followup visits.  Understanding was verbalized and all questions were answered.  IV removed without complication; patient tolerated well.  Patient discharged home via wheelchair in stable condition escorted by volunteer staff.  

## 2022-02-01 NOTE — Plan of Care (Signed)
  Problem: Clinical Measurements: Goal: Ability to maintain clinical measurements within normal limits will improve Outcome: Progressing   Problem: Activity: Goal: Risk for activity intolerance will decrease Outcome: Progressing   Problem: Coping: Goal: Level of anxiety will decrease Outcome: Progressing   Problem: Pain Managment: Goal: General experience of comfort will improve Outcome: Progressing   Problem: Safety: Goal: Ability to remain free from injury will improve Outcome: Progressing

## 2022-02-01 NOTE — TOC Progression Note (Addendum)
Transition of Care [facility]) - Progression Note    Patient Details  Name: Sandra Charles MRN: [id number] Date of Birth: Dec 30, 1955  Transition of Care [facility]) CM/SW [place], [place] Phone Number: 02/01/2022, 2:30 PM  Clinical Narrative:      TOC spoke to the son and he wants her to have home health.She is current with her PCP. Her son drives her to appointments. Her pharmacy is [facility] in [place], [place].  [facility] HH.      Expected Discharge Plan and [place] PT/OT   Expected Discharge Date: 02/01/22                                     Social Determinants of Health (SDOH) Interventions    Readmission Risk Interventions     No data to display

## 2022-02-01 NOTE — Evaluation (Signed)
Physical Therapy Evaluation Patient Details Name: Sandra Charles MRN: [id number] DOB: 1955/12/22 Today's Date: 02/01/2022  History of Present Illness  Sandra Charles is a 57yoF who comes to [facility] on 12/4 after seizure at home. PMH: HTNm HLD, lewy body dementia, seizure d/o. Per EMS, patient had a witnessed generalized tonic-clonic seizure at home lasting 2 to 3 minutes. PT DC to home on 12/5 after neurology r/o CVA. Family called EMS same day for pt to return as mentation had not returned to baseline.  Clinical Impression  Pt admitted with above Dx. Pt has functional limitations due to deficits below (see "PT Problem List"). PLOF/home setup taken from family report previous day. Today pt requires no physical assistance to perform bed mobility only supervision, requires minA for transfers without walker, but could have performed with RW at minguard to supervision. Short distance walking not performed due to bowel urgency and incontinence reconnaissance. Patient's performance this date reveals similar to baseline ability, independence, and tolerance in performing all basic mobility required for performance of activities of daily living. Pt reports to feel near baseline, denies any feeling of acute deficit. Pt requires additional DME, close physical assistance, and cues for safe participate in mobility. Pt will benefit from skilled PT intervention to increase independence and safety with basic mobility in preparation for discharge to the venue listed below.     Orthostatic VS for the past 24 hrs (Last 3 readings):  BP- Lying Pulse- Lying BP- Sitting Pulse- Sitting BP- Standing at 0 minutes Pulse- Standing at 0 minutes BP- Standing at 3 minutes Pulse- Standing at 3 minutes  01/31/22 1139 128/63 75 132/63 66 153/64 90 (!) 177/154 90        Recommendations for follow up therapy are one component of a multi-disciplinary discharge planning process, led by the attending physician.  Recommendations may be updated  based on patient status, additional functional criteria and insurance authorization.  Follow Up Recommendations Home health PT      Assistance Recommended at Discharge Set up Supervision/Assistance  Patient can return home with the following  A little help with walking and/or transfers;A little help with bathing/dressing/bathroom;Assist for transportation;Help with stairs or ramp for entrance;Assistance with cooking/housework    Equipment Recommendations None recommended by PT  Recommendations for Other Services       Functional Status Assessment Patient has had a recent decline in their functional status and demonstrates the ability to make significant improvements in function in a reasonable and predictable amount of time.     Precautions / Restrictions Precautions Precautions: Fall Restrictions Weight Bearing Restrictions: No      Mobility  Bed Mobility Overal bed mobility: Needs Assistance Bed Mobility: Supine to Sit     Supine to sit: Supervision, HOB elevated          Transfers Overall transfer level: Needs assistance Equipment used: None Transfers: Bed to chair/wheelchair/BSC Sit to Stand: Min assist Stand pivot transfers: Min assist         General transfer comment: assisting to commode to avoid contamination of self/RW/floor (bowel on gown/bed)    Ambulation/Gait Ambulation/Gait assistance:  (deferred due to toiletting/bath needs;)                Stairs            Wheelchair Mobility    Modified Rankin (Stroke Patients Only)       Balance Overall balance assessment: Modified Independent, Mild deficits observed, not formally tested, No apparent balance deficits (not formally assessed)  Pertinent Vitals/Pain Pain Assessment Pain Assessment: No/denies pain    Home Living Family/patient expects to be discharged to:: Private residence Living Arrangements:  Spouse/significant other Available Help at Discharge: Family Type of Home: House Home Access: Ramped entrance       Home Layout: One level Home Equipment: Conservation officer, nature (2 wheels);BSC/3in1 Additional Comments: Pt poor historian, spoke with son on the phone to obtain PLOF.    Prior Function Prior Level of Function : Independent/Modified Independent             Mobility Comments: normally independent with no AD, uses RW as needed ADLs Comments: normally independent but family have been assisting with ADLs/IADLs as needed, husband provides transporation     Hand Dominance   Dominant Hand: Left    Extremity/Trunk Assessment   Upper Extremity Assessment Upper Extremity Assessment: Overall WFL for tasks assessed;Generalized weakness    Lower Extremity Assessment Lower Extremity Assessment: Overall WFL for tasks assessed;Generalized weakness    Cervical / Trunk Assessment Cervical / Trunk Assessment:  (apparent weakness while seated EOB)  Communication   Communication: No difficulties  Cognition Arousal/Alertness: Awake/alert Behavior During Therapy: WFL for tasks assessed/performed Overall Cognitive Status: Within Functional Limits for tasks assessed                                 General Comments: History of Lewy body dementia at baseline, oriented to self and location, followed single step commands well        General Comments      Exercises     Assessment/Plan    PT Assessment Patient needs continued PT services  PT Problem List Decreased strength;Decreased range of motion;Decreased activity tolerance;Decreased balance;Decreased mobility;Decreased coordination;Decreased cognition;Decreased knowledge of use of DME;Decreased safety awareness;Decreased knowledge of precautions       PT Treatment Interventions Neuromuscular re-education;Cognitive remediation;Functional mobility training;Patient/family education;Therapeutic activities;Therapeutic  exercise;Balance training    PT Goals (Current goals can be found in the Care Plan section)  Acute Rehab PT Goals Patient Stated Goal: return to home PT Goal Formulation: With patient Time For Goal Achievement: 02/15/22 Potential to Achieve Goals: Good    Frequency Min 2X/week     Co-evaluation               AM-PAC PT "6 Clicks" Mobility  Outcome Measure Help needed turning from your back to your side while in a flat bed without using bedrails?: None Help needed moving from lying on your back to sitting on the side of a flat bed without using bedrails?: None Help needed moving to and from a bed to a chair (including a wheelchair)?: A Little Help needed standing up from a chair using your arms (e.g., wheelchair or bedside chair)?: A Little Help needed to walk in hospital room?: A Little Help needed climbing 3-5 steps with a railing? : A Lot 6 Click Score: 19    End of Session   Activity Tolerance: Patient tolerated treatment well;No increased pain Patient left: in chair;with nursing/sitter in room Nurse Communication: Mobility status PT Visit Diagnosis: Difficulty in walking, not elsewhere classified (R26.2);Other abnormalities of gait and mobility (R26.89);Other symptoms and signs involving the nervous system (R29.898)    Time: 3419-3790 PT Time Calculation (min) (ACUTE ONLY): 13 min   Charges:   PT Evaluation $PT Eval Low Complexity: 1 Low         10:28 AM, 02/01/22 Etta Grandchild, PT, DPT Physical Therapist -  [facility]  [phone] ([place])    Sandra Charles C 02/01/2022, 10:24 AM

## 2022-02-01 NOTE — Care Management Important Message (Signed)
Important Message  Patient Details  Name: Sandra Charles MRN: [id number] Date of Birth: Sep 14, 1955   Medicare Important Message Given:  N/A - LOS <3 / Initial given by admissions     Dannette [name] 02/01/2022, 2:14 PM

## 2022-02-01 NOTE — Consult Note (Signed)
Neurology Consultation Reason for Consult: Episode of confusion Referring Physician: Dwyane Dee, D  CC: Episode of confusion  History is obtained from: Patient, son  HPI: Sandra Charles is a 66 y.o. female whom I recently saw during her previous hospitalization for breakthrough seizure.  At the time I had added a small dose of Onfi and she was doing better at the time and so she was discharged home.  Upon arrival to her house, she did not seem her typical self, for instance she stated that she was cold, and then went up leg was provided she threw it off herself.  She seemed to be slightly agitated and did not seem quite her normal self.  For these reasons they brought her back to the emergency department where she was readmitted for observation.  No further episodes concerning for seizure.  He was also concerned she might of been slightly unsteady, but she walked well with PT earlier today.  Past Medical History:  Diagnosis Date   Anxiety    Arthritis    "all over"   Chronic lower back pain    Dementia (HCC)    Depression    GERD (gastroesophageal reflux disease)    Headache    "weekly" (07/15/2015)   Hyperlipidemia    Hypertension    Lewy body dementia ([place])    Mini stroke    "several since 05/2014" (07/15/2015)   Psychosis in elderly, without behavioral disturbance ([place])    Seizures ([place]) dx'd 04/2015     Family History  Problem Relation Age of Onset   Hyperlipidemia Mother    Breast cancer Mother 2   Hypertension Mother    Heart attack Mother        age 73's   Hyperlipidemia Father    Hypertension Father    Heart attack Father 45   Breast cancer Sister 31     Social History:  reports that she has quit smoking. Her smoking use included cigarettes. She has a 5.00 pack-year smoking history. She has never used smokeless tobacco. She reports that she does not currently use alcohol. She reports that she does not use drugs.   Exam: Current vital signs: BP (!) 148/58 (BP  Location: Left Arm)   Pulse 73   Temp 98.1 F (36.7 C) (Oral)   Resp 18   Ht '5\' 1"'$  (1.549 m)   Wt 69.2 kg   LMP  (LMP Unknown)   SpO2 97%   BMI 28.83 kg/m  Vital signs in last 24 hours: Temp:  [97.6 F (36.4 C)-99 F (37.2 C)] 98.1 F (36.7 C) (12/07 0728) Pulse Rate:  [54-79] 73 (12/07 0728) Resp:  [16-19] 18 (12/07 0728) BP: (82-155)/(53-66) 148/58 (12/07 0728) SpO2:  [96 %-97 %] 97 % (12/07 0728)   Physical Exam  Constitutional: Appears well-developed and well-nourished.   Neuro: Mental Status: Patient is awake, alert, she answers [facility] when I ask her where she is, but not oriented to month or year Cranial Nerves: II: Visual Fields are full. Pupils are equal, round, and reactive to light.   III,IV, VI: EOMI without ptosis or diploplia.  V: Facial sensation is symmetric to temperature VII: Facial movement is symmetric.  VIII: hearing is intact to voice X: Uvula elevates symmetrically XI: Shoulder shrug is symmetric. XII: tongue is midline without atrophy or fasciculations.  Motor: No clear focal weakness Sensory: Intact light touch  Cerebellar: No clear ataxia     I have reviewed labs in epic and the results pertinent  to this consultation are: Creatinine 1.13  I have reviewed the images obtained: MRI from 12/4-negative  Impression: 66 year old female with dementia who was confused on entering a new environment.  Given the timing surrounding the described confusion, my suspicion for breakthrough seizure is relatively low, but I favor environmental change as an etiology for her mild agitation.  I suspect that she would have improved over time at home, and would not make any changes to her antiepileptics based on this single event.  Recommendations: 1) continue lamotrigine 125 twice daily 2) continue onfi 5 mg nightly 3) neurology will be available as needed   Roland Rack, MD [facility] [phone]  If 7pm- 7am, please  page neurology on call as listed in [facility].

## 2022-02-04 LAB — CULTURE, BLOOD (ROUTINE X 2)
Culture: NO GROWTH
Culture: NO GROWTH
Special Requests: ADEQUATE
Special Requests: ADEQUATE

## 2022-02-26 ENCOUNTER — Other Ambulatory Visit (HOSPITAL_BASED_OUTPATIENT_CLINIC_OR_DEPARTMENT_OTHER): Payer: Self-pay | Admitting: Osteopathic Medicine

## 2022-02-28 ENCOUNTER — Other Ambulatory Visit (HOSPITAL_BASED_OUTPATIENT_CLINIC_OR_DEPARTMENT_OTHER): Payer: Self-pay | Admitting: Osteopathic Medicine

## 2022-03-15 ENCOUNTER — Ambulatory Visit: Payer: BLUE CROSS/BLUE SHIELD | Attending: Family | Primary: Family

## 2022-07-22 IMAGING — CT CT ABD-PELV W/ CM
2 of 5 series · 15 of 46 positions shown, 17 images · IV contrast (agent unspecified)
Comparison: CT 07/04/2011, CT 07/18/2015

CLINICAL DATA: Abdomen pain nausea vomiting

EXAM:
CT ABDOMEN AND PELVIS WITH CONTRAST
TECHNIQUE: Multidetector CT imaging of the abdomen and pelvis was performed
using the standard protocol following bolus administration of
intravenous contrast.

[Series 2: axial st · axial · 0.75mm/px · z∈[-420,-20]mm · 12 of 90 slices shown, 14 images]
[im 5/90  soft-tissue]
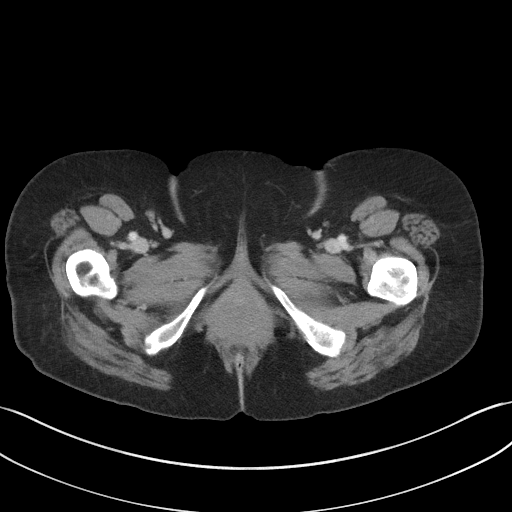
[im 5/90  bone]
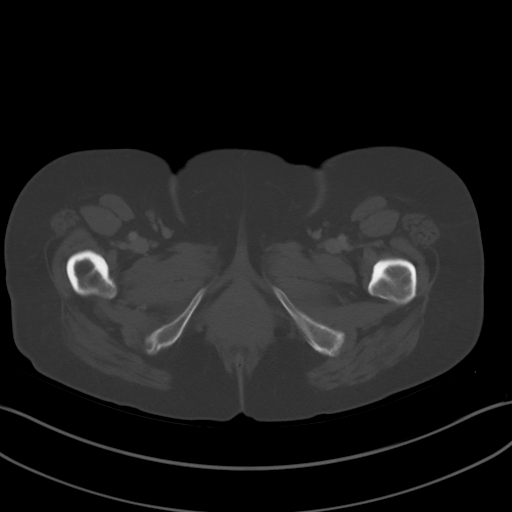
[im 15/90  soft-tissue]
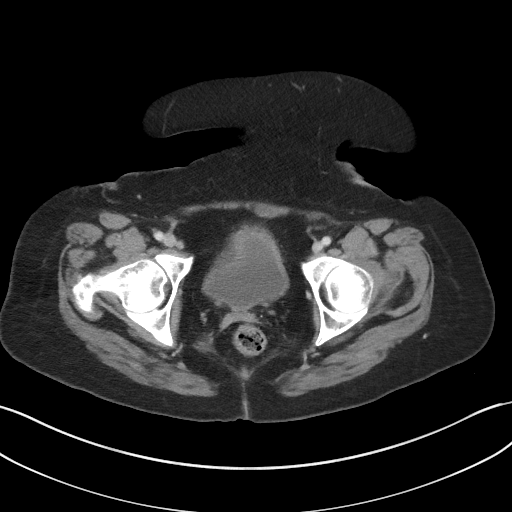
[im 19/90  soft-tissue]
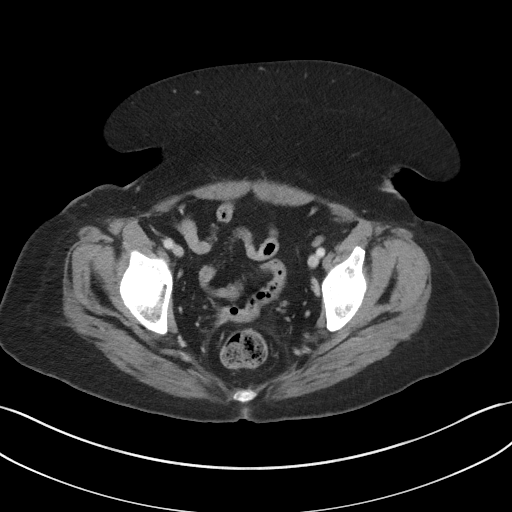
[im 29/90  soft-tissue]
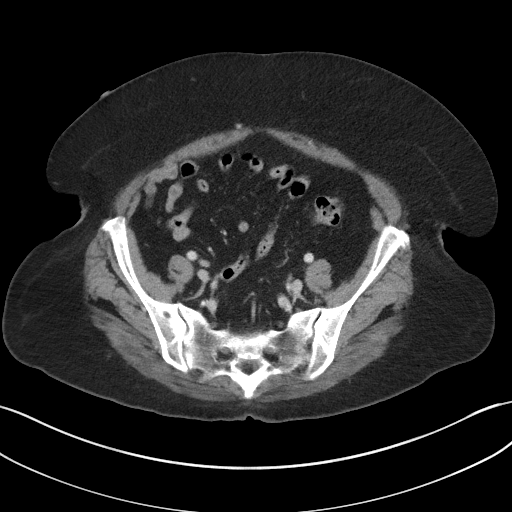
[im 33/90  soft-tissue]
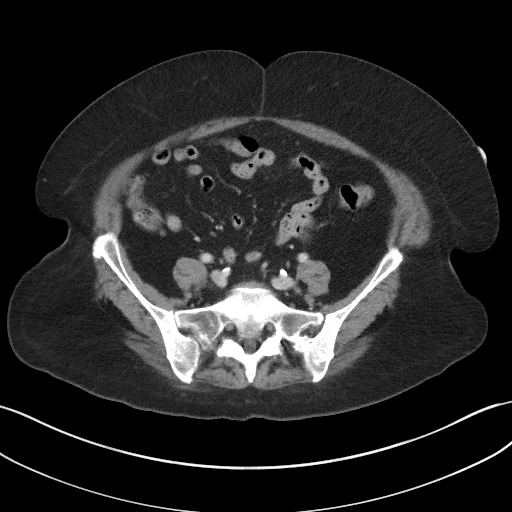
[im 43/90  soft-tissue]
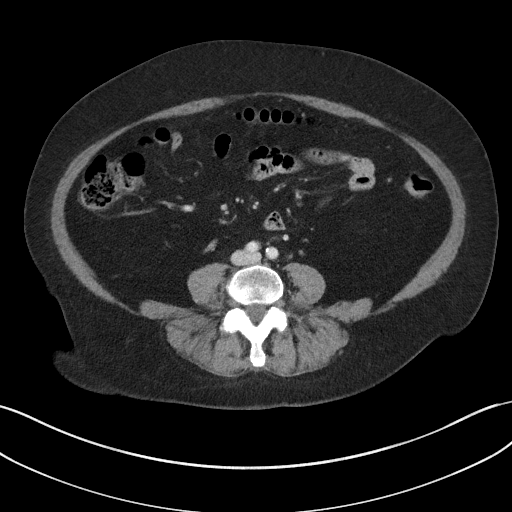
[im 47/90  soft-tissue]
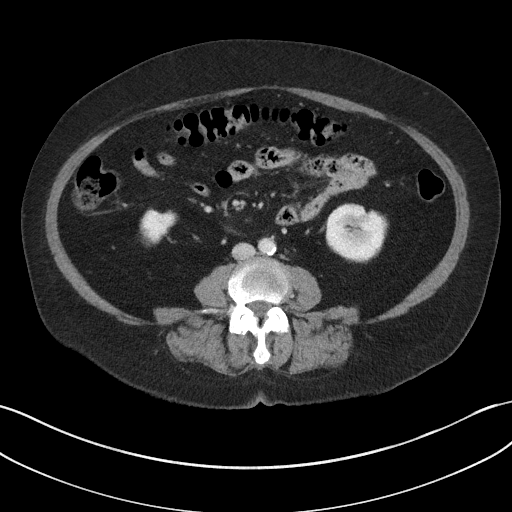
[im 57/90  soft-tissue]
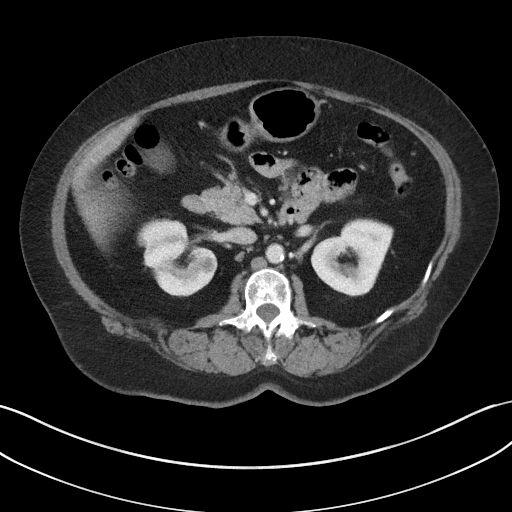
[im 61/90  soft-tissue]
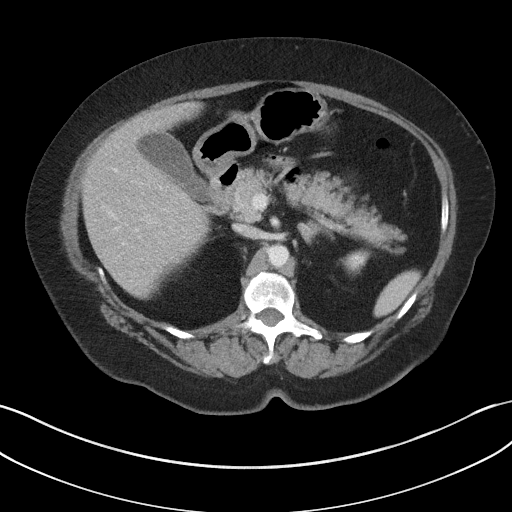
[im 61/90  bone]
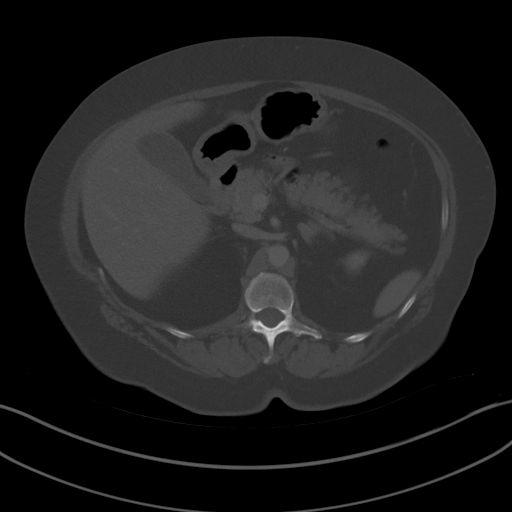
[im 71/90  soft-tissue]
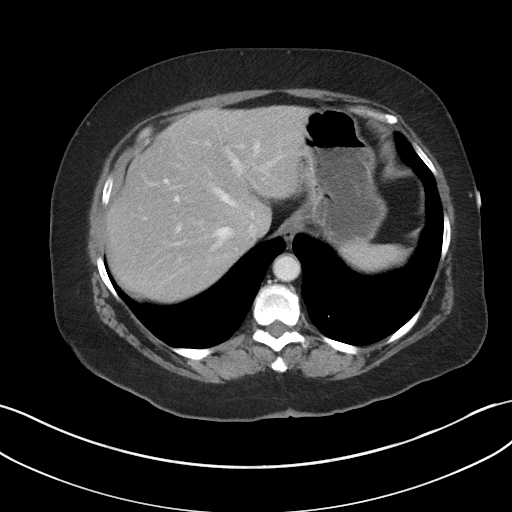
[im 75/90  soft-tissue]
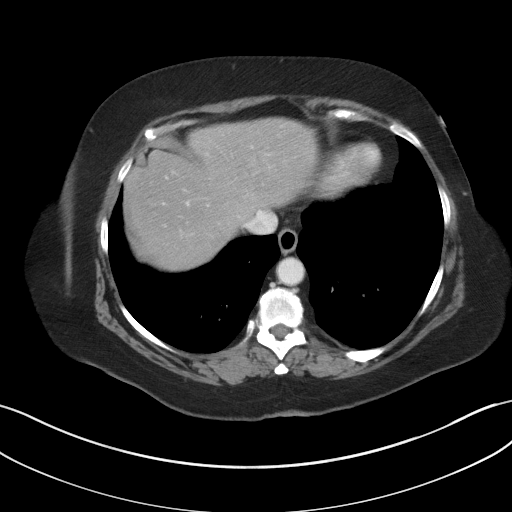
[im 85/90  soft-tissue]
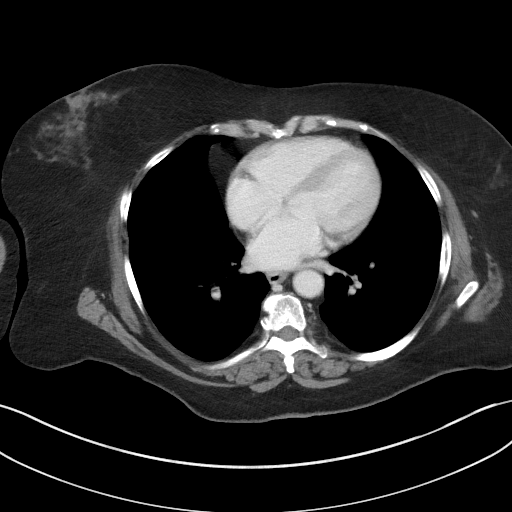

[Series 5: coronal st · coronal · 0.67mm/px · 3 of 90 slices shown]
[im 30/90  soft-tissue]
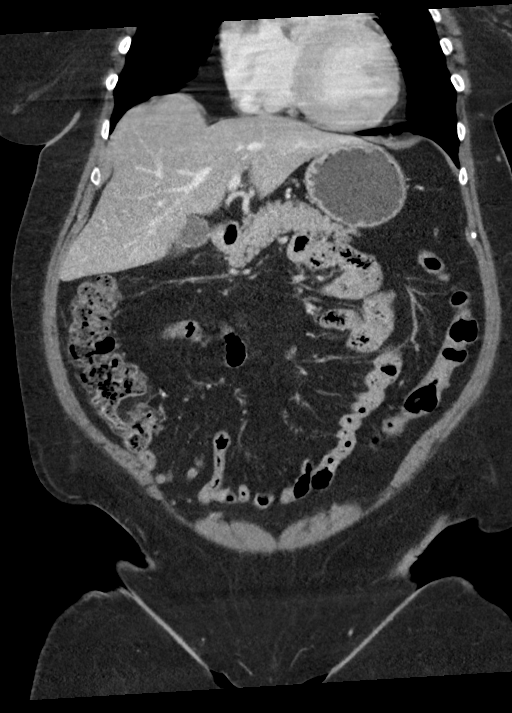
[im 40/90  soft-tissue]
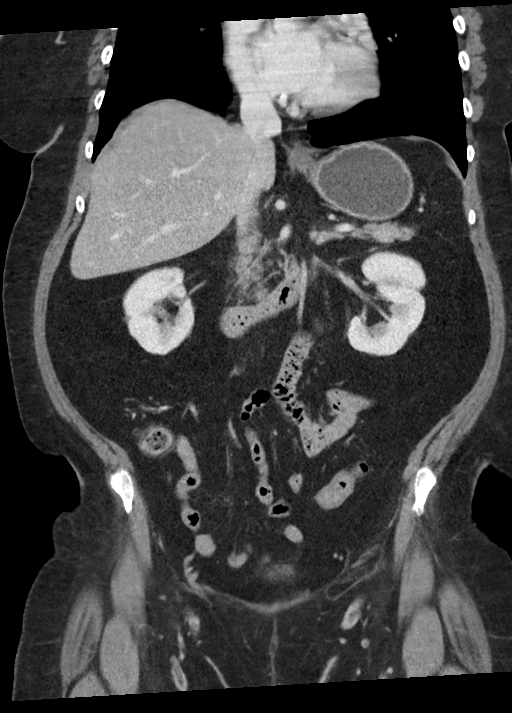
[im 50/90  soft-tissue]
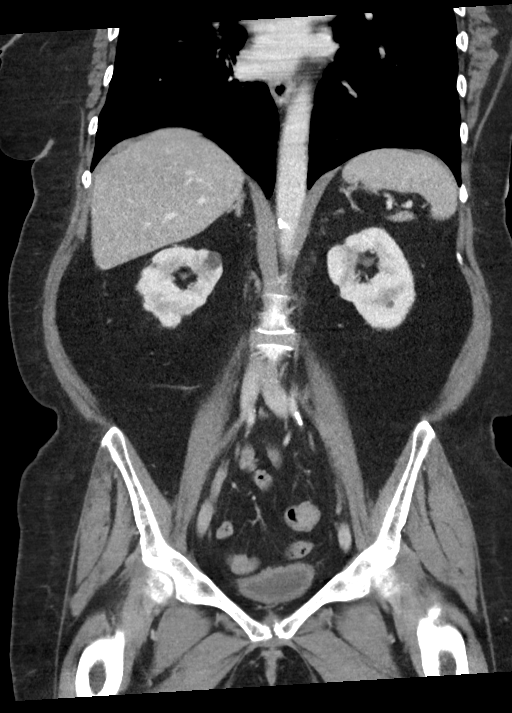

[15 of 46 positions shown; findings below may reference images not displayed]

RADIATION DOSE REDUCTION: This exam was performed according to the
departmental dose-optimization program which includes automated
exposure control, adjustment of the mA and/or kV according to
patient size and/or use of iterative reconstruction technique.

CONTRAST:  100mL OMNIPAQUE IOHEXOL 300 MG/ML  SOLN
FINDINGS: Lower chest: Lung bases demonstrate no acute consolidation or
effusion. Normal cardiac size.

Hepatobiliary: No focal liver abnormality is seen. No gallstones,
gallbladder wall thickening, or biliary dilatation.

Pancreas: Unremarkable. No pancreatic ductal dilatation or
surrounding inflammatory changes.

Spleen: Normal in size without focal abnormality.

Adrenals/Urinary Tract: Adrenal glands are normal. Kidneys show no
hydronephrosis. Cyst upper pole right kidney, no follow-up imaging
recommended. Cortical scarring within the bilateral kidneys.
Subcentimeter hypodensities too small to further characterize.
Bladder slightly thick walled and indistinct.

Stomach/Bowel: Stomach is within normal limits. Appendix appears
normal. No evidence of bowel wall thickening, distention, or
inflammatory changes.

Vascular/Lymphatic: Mild aortic atherosclerosis. No aneurysm. No
suspicious lymph node

Reproductive: Status post hysterectomy. No adnexal masses.

Other: Negative for pelvic effusion or free air

Musculoskeletal: No acute or significant osseous findings.
IMPRESSION: 1. Slightly thick-walled appearance of urinary bladder which appears
slightly indistinct, correlate for cystitis.
2. Otherwise no CT evidence for acute intra-abdominal or pelvic
abnormality.

## 2023-01-07 ENCOUNTER — Ambulatory Visit: Payer: 59 | Admitting: Psychiatry

## 2023-01-10 ENCOUNTER — Ambulatory Visit (INDEPENDENT_AMBULATORY_CARE_PROVIDER_SITE_OTHER): Payer: 59 | Admitting: Psychiatry

## 2023-01-10 ENCOUNTER — Encounter: Payer: Self-pay | Admitting: Psychiatry

## 2023-01-10 VITALS — BP 164/77 | HR 68 | Temp 97.8°F | Ht 61.0 in | Wt 186.0 lb

## 2023-01-10 DIAGNOSIS — F1290 Cannabis use, unspecified, uncomplicated: Secondary | ICD-10-CM

## 2023-01-10 DIAGNOSIS — F250 Schizoaffective disorder, bipolar type: Secondary | ICD-10-CM | POA: Diagnosis not present

## 2023-01-10 DIAGNOSIS — Z79899 Other long term (current) drug therapy: Secondary | ICD-10-CM | POA: Diagnosis not present

## 2023-01-10 DIAGNOSIS — F09 Unspecified mental disorder due to known physiological condition: Secondary | ICD-10-CM

## 2023-01-10 DIAGNOSIS — Z9189 Other specified personal risk factors, not elsewhere classified: Secondary | ICD-10-CM

## 2023-01-10 NOTE — Progress Notes (Signed)
BH MD OP Progress Note  01/10/2023 12:04 PM Sandra Charles  MRN:  [id number]  Chief Complaint:  Chief Complaint  Patient presents with   Follow-up   Depression   Anxiety   Psychosis   Medication Refill   HPI: Sandra Charles is a 67 year old African-American female, currently lives with her significant other, on SSD, lives in [place], has a history of schizoaffective disorder, probable Lewy body dementia, hypertension, dyslipidemia, seizure disorder, prediabetes, gastroesophageal reflux disease was evaluated in office today.  Patient being a limited historian majority of information was obtained from medical records in [place] as well as collateral information was obtained from niece Britta Mccreedy who presented with patient.  Patient answered questions in short phrases and most of the time had trouble answering questions likely due to cognitive issues.  Patient was unable to give the date the month, year.  Patient was unable to give any details about medications or her medical history.  Patient however appeared to be calm and cooperative in session.  Patient currently denies any hallucinations did not appear to be responding to internal stimuli.  Did not appear to be preoccupied with any delusions.  Patient reports current medications as beneficial and sleep as good.  Patient reports appetite is fair.  Does report she watches a lot of TV.  Otherwise unable to elaborate further.  According to niece Britta Mccreedy, patient has had psychiatric problems since the past several years.  She has had multiple inpatient psychiatric hospitalizations when she was younger.  She does have a history of hallucinations both visual, auditory as well as paranoia.  Patient almost 5 years ago developed seizures.  She was admitted at [facility] for seizures and sepsis at that time.  I have reviewed notes per Dr. Cher Nakai 09/25/2018-patient was diagnosed with seizures and sepsis and was stabilized on the medical service at that time.   Patient was started on anticonvulsants including Keppra, Lamictal.'  According to niece patient thereafter was referred to neurology at [facility] who continues to manage her current medical issues.  She probably has  Lewy body dementia.  She is currently on Aricept.  Her psychiatrist at [facility] is not currently in network to manage her medications and hence they decided to come to this practice.  Patient currently is doing fairly well on the current medication regimen with regards to her psychosis.  Patient however does not do much, she is not interested in going out, doing any kind of activities.  She does need a lot of help.  Her son sets up her pillbox every weekend and her significant other gives her medication to her every day.  She hence has been compliant on medications.  She just sits and watches TV most of the time.  They tried to get her peers support however she was not invested in continuing that service.  Currently no concerns with current psychotropics.  Okay to continue the same.  Patient does have a history of panic attacks anxiety symptoms in the past currently it is well-managed.  Denies any history of trauma.  Does not appear to have any suicidality, homicidality or perceptual disturbances as observed by family.  Patient does use cannabis on a regular basis.  When patient was advised to elaborate on her cannabis use patient tried to minimize her current use history.  Patient reports the last time she used was yesterday, 1 joint.  According to niece she is likely using it every day.    Visit Diagnosis:    ICD-10-CM  1. Schizoaffective disorder, bipolar type (HCC)  F25.0     2. Long term current use of cannabis  F12.90     3. Cognitive disorder  F09    probable Lewy body dementia, currently with [facility] neurology    4. At risk for prolonged QT interval syndrome  Z91.89 EKG 12-Lead    5. High risk medication use  Z79.899 Prolactin    Sodium    Urine drugs of abuse scrn w alc, routine (Ref  Lab)    TSH      Past Psychiatric History: Patient used to be under the care of [facility] psychiatry.  Prior to that she was with [facility] for a couple of years.  Patient with multiple inpatient behavioral health admissions in the past including at [place], [place], [place].  Most recent admission at [facility]-07/02/2021 - 07/07/2021.  Patient also had another admission at [facility] in 2017.  Denies past history of suicide attempts. Past trials of medications-Celexa, Xanax, Depakote, Lamictal  Past Medical History:  Past Medical History:  Diagnosis Date   Anxiety    Arthritis    "all over"   Chronic lower back pain    Dementia (HCC)    Depression    GERD (gastroesophageal reflux disease)    Headache    "weekly" (07/15/2015)   Hyperlipidemia    Hypertension    Lewy body dementia (HCC)    Mini stroke    "several since 05/2014" (07/15/2015)   Psychosis in elderly, without behavioral disturbance (HCC)    Seizures (HCC) dx'd 04/2015    Past Surgical History:  Procedure Laterality Date   COLONOSCOPY WITH PROPOFOL N/A 11/20/2021   Procedure: COLONOSCOPY WITH PROPOFOL;  Surgeon: Regis Bill, MD;  Location: [facility] ENDOSCOPY;  Service: Endoscopy;  Laterality: N/A;   DILATION AND CURETTAGE OF UTERUS     ESOPHAGOGASTRODUODENOSCOPY (EGD) WITH PROPOFOL N/A 11/20/2021   Procedure: ESOPHAGOGASTRODUODENOSCOPY (EGD) WITH PROPOFOL;  Surgeon: Regis Bill, MD;  Location: [facility] ENDOSCOPY;  Service: Endoscopy;  Laterality: N/A;   FRACTURE SURGERY     LUMBAR PUNCTURE  07/14/2015   ORIF ANKLE FRACTURE Right 01/20/2021   Procedure: OPEN REDUCTION INTERNAL FIXATION (ORIF) ANKLE FRACTURE;  Surgeon: Christena Flake, MD;  Location: [facility] ORS;  Service: Orthopedics;  Laterality: Right;   TUBAL LIGATION     VAGINAL HYSTERECTOMY      Family Psychiatric History: As noted below.Will need to explore in future sessions.  Family History:  Family History  Problem Relation Age of Onset   Hyperlipidemia Mother    Breast cancer Mother 20    Hypertension Mother    Heart attack Mother        age 17's   Hyperlipidemia Father    Hypertension Father    Heart attack Father 15   Breast cancer Sister 76   Mental illness No known conditions     Social History: Patient is currently living with her significant other in [place].  She has 1 son.  Patient is currently on Social Security disability.  She denies any history of trauma.  She has good support system from her family including her sister and niece. Social History   Socioeconomic History   Marital status: Single    Spouse name: Not on file   Number of children: 1   Years of education: Not on file   Highest education level: Not on file  Occupational History   Occupation: disabled  Tobacco Use   Smoking status: Former    Current packs/day: 1.00  Average packs/day: 1 pack/day for 5.0 years (5.0 ttl pk-yrs)    Types: Cigarettes   Smokeless tobacco: Never   Tobacco comments:    "quit smoking cigarettes in the early 2000s"  Vaping Use   Vaping status: Never Used  Substance and Sexual Activity   Alcohol use: Not Currently    Comment: 07/15/2015 "nothing in the last few years"   Drug use: No   Sexual activity: Not Currently  Other Topics Concern   Not on file  Social History Narrative   Lives with significant other, has son who comes by daily.   Social Determinants of Health   Financial Resource Strain: Low Risk  (10/05/2017)   Overall Financial Resource Strain (CARDIA)    Difficulty of Paying Living Expenses: Not hard at all  Food Insecurity: No Food Insecurity (01/30/2022)   Hunger Vital Sign    Worried About Running Out of Food in the Last Year: Never true    Ran Out of Food in the Last Year: Never true  Transportation Needs: No Transportation Needs (01/30/2022)   PRAPARE - Administrator, Civil Service (Medical): No    Lack of Transportation (Non-Medical): No  Physical Activity: Inactive (10/05/2017)   Exercise Vital Sign    Days of Exercise  per Week: 0 days    Minutes of Exercise per Session: 0 min  Stress: No Stress Concern Present (10/05/2017)   [employer] of Occupational Health - Occupational Stress Questionnaire    Feeling of Stress : Not at all  Social Connections: Moderately Isolated (10/05/2017)   Social Connection and Isolation Panel [NHANES]    Frequency of Communication with Friends and Family: Once a week    Frequency of Social Gatherings with Friends and Family: Once a week    Attends Religious Services: Never    Database administrator or Organizations: No    Attends Banker Meetings: Never    Marital Status: Living with partner    Allergies:  Allergies  Allergen Reactions   Lovastatin Other (See Comments)    Unknown reaction    Metabolic Disorder Labs: Lab Results  Component Value Date   HGBA1C 5.6 07/05/2021   MPG 114.02 07/05/2021   Lab Results  Component Value Date   PROLACTIN 9.4 08/10/2015   PROLACTIN 7.1 07/12/2015   Lab Results  Component Value Date   CHOL 190 07/05/2021   TRIG 100 07/05/2021   HDL 49 07/05/2021   CHOLHDL 3.9 07/05/2021   VLDL 20 07/05/2021   LDLCALC 121 (H) 07/05/2021   LDLCALC 48 08/10/2015   Lab Results  Component Value Date   TSH 0.934 08/27/2020   TSH 6.026 (H) 03/27/2018    Therapeutic Level Labs: No results found for: "LITHIUM" Lab Results  Component Value Date   VALPROATE <10 (L) 01/29/2022   VALPROATE <10 (L) 01/29/2022   No results found for: "CBMZ"  Current Medications: Current Outpatient Medications  Medication Sig Dispense Refill   amLODipine (NORVASC) 10 MG tablet Take 10 mg by mouth daily. For high blood pressure     cloBAZam (ONFI) 10 MG tablet Take 0.5 tablets (5 mg total) by mouth at bedtime. 15 tablet 0   donepezil (ARICEPT) 10 MG tablet Take 10 mg by mouth at bedtime.     lamoTRIgine (LAMICTAL) 100 MG tablet Take 1 tablet (100 mg total) by mouth 2 (two) times daily. Along with 25 mg tablet for total of 125 mg bid.  60 tablet 0   lamoTRIgine (LAMICTAL)  25 MG tablet Take 1 tablet (25 mg total) by mouth 2 (two) times daily. Along with 100 mg tablet for total of 125 mg bid. 60 tablet 0   losartan (COZAAR) 25 MG tablet Take 25 mg by mouth daily.     pantoprazole (PROTONIX) 40 MG tablet Take 1 tablet (40 mg total) by mouth daily. 30 tablet 11   propranolol ER (INDERAL LA) 60 MG 24 hr capsule Take 60 mg by mouth daily.  11   QUEtiapine (SEROQUEL) 50 MG tablet Take 1 tablet (50 mg total) by mouth at bedtime. 30 tablet 3   rosuvastatin (CRESTOR) 20 MG tablet Take 20 mg by mouth daily.     sertraline (ZOLOFT) 100 MG tablet Take 100 mg by mouth daily.  2   No current facility-administered medications for this visit.     Musculoskeletal: Strength & Muscle Tone:  Limited, lower extremities, has trouble walking Gait & Station:  Gait is slowed and unsteady due to past history of ankle fracture per patient. Patient leans: N/A  Psychiatric Specialty Exam: Review of Systems  Unable to perform ROS: Psychiatric disorder    Blood pressure (!) 164/77, pulse 68, temperature 97.8 F (36.6 C), temperature source Skin, height 5\' 1"  (1.549 m), weight 186 lb (84.4 kg).Body mass index is 35.14 kg/m.  General Appearance: Casual  Eye Contact:  Fair  Speech:  Clear and Coherent  Volume:  Normal  Mood:  Euthymic  Affect:  Restricted  Thought Process:  Goal Directed and Descriptions of Associations: Intact  Orientation:  Other:  Self, situation  Thought Content: Logical   Suicidal Thoughts:  No  Homicidal Thoughts:  No  Memory:  Immediate;   Fair  Judgement:  Fair  Insight:  Shallow  Psychomotor Activity:  Normal  Concentration:  Concentration: Poor and Attention Span: Poor  Recall:  Poor  Fund of Knowledge: Poor  Language: Fair  Akathisia:  No  Handed:  Left  AIMS (if indicated): done, patient denies any tremors, abnormal involuntary movements.  Assets:  Desire for Improvement Housing Social  Support Transportation  ADL's:  Intact  Cognition: Limited  Sleep:  Fair   Screenings: AIMS    Flowsheet Row Admission (Discharged) from 08/10/2015 in [facility] INPATIENT BEHAVIORAL MEDICINE  AIMS Total Score 0      AUDIT    Flowsheet Row Admission (Discharged) from 07/02/2021 in [facility] BEHAVIORAL MEDICINE Admission (Discharged) from 08/10/2015 in [facility] INPATIENT BEHAVIORAL MEDICINE  Alcohol Use Disorder Identification Test Final Score (AUDIT) 0 0      GAD-7    Flowsheet Row Office Visit from 01/10/2023 in [facility]  Total GAD-7 Score 3      PHQ2-9    Flowsheet Row Office Visit from 01/10/2023 in [facility] Office Visit from 12/14/2014 in [facility] - A Dept Of [place]. [facility] Office Visit from 11/09/2014 in [facility] - A Dept Of Eligha Bridegroom. [facility] Office Visit from 10/18/2014 in [facility] - A Dept Of Eligha Bridegroom. [facility]  PHQ-2 Total Score 1 0 0 0  PHQ-9 Total Score 3 -- -- --      Flowsheet Row Office Visit from 01/10/2023 in [facility] ED to Hosp-Admission (Discharged) from 01/30/2022 in [facility] 1C MEDICAL TELEMETRY ED to Hosp-Admission (Discharged) from 01/29/2022 in [facility] 1C MEDICAL TELEMETRY  C-SSRS  RISK CATEGORY No Risk No Risk No Risk        Assessment and Plan: Sandra Charles is a 67 year old African-American female with history of schizoaffective disorder, seizure disorder, possible Lewy body dementia, hypertension, hyperlipidemia, gastroesophageal reflux disease was evaluated in office today, presented for medication management.  Patient with previous admission to [facility] behavioral health services-07/02/2021-used to be under the care of [facility] psychiatry, however currently her psychiatrist is not  in network and hence presented to this practice for medication management.  Patient currently stable on the current medication regimen except for her continued cognitive issues as well as comorbid cannabis use .  She will benefit from continued medication management, psychotherapy referral.  Plan as noted below.  Plan Schizoaffective disorder bipolar type-stable Continue Seroquel extended release 50 mg at bedtime Continue sertraline 100 mg p.o. daily Patient is also on medications like Lamictal-which is prescribed by neurology for her seizures although it is a mood stabilizer as well.  Long-term use of cannabis-rule out cannabis use disorder-unstable Provided counseling Will get urine drug screen.  Cognitive disorder-probable Lewy body dementia-unstable Patient with significant cognitive/memory problems-patient continues to be under the care of neurology with [facility]. Continue Donepezil 10 mg at bedtime.  At risk for prolonged QT syndrome-we will order EKG.  Patient to call [phone] to get this completed.  Patient is on medications like Seroquel as well as comorbid use of cannabis Will recommend monitoring EKG.   High risk medication use-I have reviewed labs including hemoglobin A1c-discussed with patient-5.8-prediabetic range-patient to continue to follow up with primary care provider.  Labs dated 11/20/2022. Lipid panel-03/16/2022-within normal limits CBC dated 01/31/2022-within normal limits BMP-01/31/2022-within normal limits  Will order urine drug screen, prolactin level, TSH-patient to go to [facility] lab.  Collateral information obtained from niece Britta Mccreedy since patient is a limited historian.  Collateral information obtained from medical records including most recent admission to [facility] geriatric unit-07/02/2021-Dr.Herrick -patient at that admission was discharged on sertraline and Seroquel.  I have also reviewed notes per neurology-Dr. Thereasa Distance 10/10/2021-patient with possible Lewy body dementia,  tonic-clonic seizures, schizoaffective disorder.  Patient with elevated blood pressure reading in session today patient to continue to follow up with primary care provider.    Collaboration of Care: Collaboration of Care: Referral or follow-up with counselor/therapist AEB I have referred patient for psychotherapy-provided resources in the community.  Patient/Guardian was advised Release of Information must be obtained prior to any record release in order to collaborate their care with an outside provider. Patient/Guardian was advised if they have not already done so to contact the registration department to sign all necessary forms in order for [place] to release information regarding their care.   Consent: Patient/Guardian gives verbal consent for treatment and assignment of benefits for services provided during this visit. Patient/Guardian expressed understanding and agreed to proceed.   I have spent atleast 60 minutes face to face with patient today which includes the time spent for preparing to see the patient ( e.g., review of test, records ), obtaining and to review and separately obtained history , ordering medications and test ,psychoeducation and supportive psychotherapy and care coordination,as well as documenting clinical information in electronic health record,interpreting and communication of test results.  Jomarie Longs, MD 01/11/2023, 8:02 AM

## 2023-01-10 NOTE — Progress Notes (Deleted)
Psychiatric Initial Adult Assessment   Patient Identification: Sandra Charles MRN:  [id number] Date of Evaluation:  01/10/2023 Referral Source: Gwen Pounds MD Chief Complaint:   Chief Complaint  Patient presents with   Establish Care   Visit Diagnosis:    ICD-10-CM   1. Schizoaffective disorder, bipolar type (HCC)  F25.0     2. Long term current use of cannabis  F12.90     3. Cognitive disorder  F09     4. At risk for prolonged QT interval syndrome  Z91.89 EKG 12-Lead    5. High risk medication use  Z79.899 Prolactin    Sodium    Urine drugs of abuse scrn w alc, routine (Ref Lab)    TSH      History of Present Illness:  ***  Associated Signs/Symptoms: Depression Symptoms:  {DEPRESSION SYMPTOMS:20000} (Hypo) Manic Symptoms:  {BHH MANIC SYMPTOMS:22872} Anxiety Symptoms:  {BHH ANXIETY SYMPTOMS:22873} Psychotic Symptoms:  {BHH PSYCHOTIC SYMPTOMS:22874} PTSD Symptoms: {BHH PTSD SYMPTOMS:22875}  Past Psychiatric History: ***  Previous Psychotropic Medications: {YES/NO:21197}  Substance Abuse History in the last 12 months:  {yes no:314532}  Consequences of Substance Abuse: {BHH CONSEQUENCES OF SUBSTANCE ABUSE:22880}  Past Medical History:  Past Medical History:  Diagnosis Date   Anxiety    Arthritis    "all over"   Chronic lower back pain    Dementia (HCC)    Depression    GERD (gastroesophageal reflux disease)    Headache    "weekly" (07/15/2015)   Hyperlipidemia    Hypertension    Lewy body dementia (HCC)    Mini stroke    "several since 05/2014" (07/15/2015)   Psychosis in elderly, without behavioral disturbance (HCC)    Seizures (HCC) dx'd 04/2015    Past Surgical History:  Procedure Laterality Date   COLONOSCOPY WITH PROPOFOL N/A 11/20/2021   Procedure: COLONOSCOPY WITH PROPOFOL;  Surgeon: Regis Bill, MD;  Location: [facility] END[facility]COPY;  Service: Endoscopy;  Laterality: N/A;   DILATION AND CURETTAGE OF UTERUS     ESOPHAGOGASTRODUODENOSCOPY (EGD) WITH  PROPOFOL N/A 11/20/2021   Procedure: ESOPHAGOGASTRODUODENOSCOPY (EGD) WITH PROPOFOL;  Surgeon: Regis Bill, MD;  Location: [facility] ENDOSCOPY;  Service: Endoscopy;  Laterality: N/A;   FRACTURE SURGERY     LUMBAR PUNCTURE  07/14/2015   ORIF ANKLE FRACTURE Right 01/20/2021   Procedure: OPEN REDUCTION INTERNAL FIXATION (ORIF) ANKLE FRACTURE;  Surgeon: Christena Flake, MD;  Location: [facility] ORS;  Service: Orthopedics;  Laterality: Right;   TUBAL LIGATION     VAGINAL HYSTERECTOMY      Family Psychiatric History: ***  Family History:  Family History  Problem Relation Age of Onset   Hyperlipidemia Mother    Breast cancer Mother 55   Hypertension Mother    Heart attack Mother        age 5's   Hyperlipidemia Father    Hypertension Father    Heart attack Father 22   Breast cancer Sister 66    Social History:   Social History   Socioeconomic History   Marital status: Single    Spouse name: Not on file   Number of children: 1   Years of education: Not on file   Highest education level: Not on file  Occupational History   Occupation: disabled  Tobacco Use   Smoking status: Former    Current packs/day: 1.00    Average packs/day: 1 pack/day for 5.0 years (5.0 ttl pk-yrs)    Types: Cigarettes   Smokeless tobacco: Never   Tobacco  comments:    "quit smoking cigarettes in the early 2000s"  Vaping Use   Vaping status: Never Used  Substance and Sexual Activity   Alcohol use: Not Currently    Comment: 07/15/2015 "nothing in the last few years"   Drug use: No   Sexual activity: Not Currently  Other Topics Concern   Not on file  Social History Narrative   Lives with significant other, has son who comes by daily.   Social Determinants of Health   Financial Resource Strain: Low Risk  (10/05/2017)   Overall Financial Resource Strain (CARDIA)    Difficulty of Paying Living Expenses: Not hard at all  Food Insecurity: No Food Insecurity (01/30/2022)   Hunger Vital Sign    Worried About  Running Out of Food in the Last Year: Never true    Ran Out of Food in the Last Year: Never true  Transportation Needs: No Transportation Needs (01/30/2022)   PRAPARE - Administrator, Civil Service (Medical): No    Lack of Transportation (Non-Medical): No  Physical Activity: Inactive (10/05/2017)   Exercise Vital Sign    Days of Exercise per Week: 0 days    Minutes of Exercise per Session: 0 min  Stress: No Stress Concern Present (10/05/2017)   [employer] of Occupational Health - Occupational Stress Questionnaire    Feeling of Stress : Not at all  Social Connections: Moderately Isolated (10/05/2017)   Social Connection and Isolation Panel [NHANES]    Frequency of Communication with Friends and Family: Once a week    Frequency of Social Gatherings with Friends and Family: Once a week    Attends Religious Services: Never    Database administrator or Organizations: No    Attends Banker Meetings: Never    Marital Status: Living with partner    Additional Social History: ***  Allergies:   Allergies  Allergen Reactions   Lovastatin Other (See Comments)    Unknown reaction    Metabolic Disorder Labs: Lab Results  Component Value Date   HGBA1C 5.6 07/05/2021   MPG 114.02 07/05/2021   Lab Results  Component Value Date   PROLACTIN 9.4 08/10/2015   PROLACTIN 7.1 07/12/2015   Lab Results  Component Value Date   CHOL 190 07/05/2021   TRIG 100 07/05/2021   HDL 49 07/05/2021   CHOLHDL 3.9 07/05/2021   VLDL 20 07/05/2021   LDLCALC 121 (H) 07/05/2021   LDLCALC 48 08/10/2015   Lab Results  Component Value Date   TSH 0.934 08/27/2020    Therapeutic Level Labs: No results found for: "LITHIUM" No results found for: "CBMZ" Lab Results  Component Value Date   VALPROATE <10 (L) 01/29/2022    Current Medications: Current Outpatient Medications  Medication Sig Dispense Refill   amLODipine (NORVASC) 10 MG tablet Take 10 mg by mouth daily. For  high blood pressure     cloBAZam (ONFI) 10 MG tablet Take 0.5 tablets (5 mg total) by mouth at bedtime. 15 tablet 0   donepezil (ARICEPT) 10 MG tablet Take 10 mg by mouth at bedtime.     lamoTRIgine (LAMICTAL) 100 MG tablet Take 1 tablet (100 mg total) by mouth 2 (two) times daily. Along with 25 mg tablet for total of 125 mg bid. 60 tablet 0   lamoTRIgine (LAMICTAL) 25 MG tablet Take 1 tablet (25 mg total) by mouth 2 (two) times daily. Along with 100 mg tablet for total of 125 mg bid. 60 tablet 0  losartan (COZAAR) 25 MG tablet Take 25 mg by mouth daily.     pantoprazole (PROTONIX) 40 MG tablet Take 1 tablet (40 mg total) by mouth daily. 30 tablet 11   propranolol ER (INDERAL LA) 60 MG 24 hr capsule Take 60 mg by mouth daily.  11   QUEtiapine (SEROQUEL) 50 MG tablet Take 1 tablet (50 mg total) by mouth at bedtime. 30 tablet 3   rosuvastatin (CRESTOR) 20 MG tablet Take 20 mg by mouth daily.     sertraline (ZOLOFT) 100 MG tablet Take 100 mg by mouth daily.  2   No current facility-administered medications for this visit.    Musculoskeletal: Strength & Muscle Tone: {desc; muscle tone:32375} Gait & Station: {PE GAIT ED NATL:22525} Patient leans: {Patient Leans:21022755}  Psychiatric Specialty Exam: Review of Systems  Blood pressure (!) 164/77, pulse 68, temperature 97.8 F (36.6 C), temperature source Skin, height 5\' 1"  (1.549 m), weight 186 lb (84.4 kg).Body mass index is 35.14 kg/m.  General Appearance: {Appearance:22683}  Eye Contact:  {BHH EYE CONTACT:22684}  Speech:  {Speech:22685}  Volume:  {Volume (PAA):22686}  Mood:  {BHH MOOD:22306}  Affect:  {Affect (PAA):22687}  Thought Process:  {Thought Process (PAA):22688}  Orientation:  {BHH ORIENTATION (PAA):22689}  Thought Content:  {Thought Content:22690}  Suicidal Thoughts:  {ST/HT (PAA):22692}  Homicidal Thoughts:  {ST/HT (PAA):22692}  Memory:  {BHH MEMORY:22881}  Judgement:  {Judgement (PAA):22694}  Insight:  {Insight  (PAA):22695}  Psychomotor Activity:  {Psychomotor (PAA):22696}  Concentration:  {Concentration:21399}  Recall:  {BHH GOOD/FAIR/POOR:22877}  Fund of Knowledge:{BHH GOOD/FAIR/POOR:22877}  Language: {BHH GOOD/FAIR/POOR:22877}  Akathisia:  {BHH YES OR NO:22294}  Handed:  {Handed:22697}  AIMS (if indicated):  {Desc; done/not:10129}  Assets:  {Assets (PAA):22698}  ADL's:  {BHH ZHY'Q:65784}  Cognition: {chl bhh cognition:304700322}  Sleep:  {BHH GOOD/FAIR/POOR:22877}   Screenings: AIMS    Flowsheet Row Admission (Discharged) from 08/10/2015 in [facility]IENT [facility]ICINE  AIMS Total Score 0      AUDIT    Flowsheet Row Admission (Discharged) from 07[facility]02/2021 in [facility] Admission (Discharged) from 08/10/2015 in [facility] INPATIENT BEHAVIORAL MEDICINE  Alcohol Use Disorder Identification Test Final Score (AUDIT) 0 0      GAD-7    Flowsheet Row Office Visit from 01/10/2023 in [facility]  Total GAD-7 Score 3      PHQ2-9    Flowsheet Row Office Visit from 01/10/2023 in [facility] Office Visit from 12/14/2014 in [facility] - A Dept Of [place]. [facility] Office Visit from 11/09/2014 in [facility] - A Dept Of [facility]. [facility] Office Visit from 10[facility]18/2014 in [facility] - [facility] Dept Of [facility]. [facility]  PHQ-2 Total Score 1 0 0 0  PHQ-9 Total Score 3 -- -- --      Flowsheet Row Office Visit from 01/10/2023 in [facility] ED to Hosp-Admission (Discharged) from 01/30/2022 in [facility] 1C MEDICAL TELEMETRY ED to Hosp-Admission (Discharged) from 01/29/2022 in [facility] 1C MEDICAL TELEMETRY  C-SSRS RISK CATEGORY No Risk No Risk No Risk       Assessment and Plan: ***  Collaboration of Care: {BH OP  Collaboration of Care:21014065}  Patient/Guardian was advised Release of Information must be obtained prior to any record release in order to collaborate their care with an outside provider. Patient/Guardian was advised if they have  not already done so to contact the registration department to sign all necessary forms in order for [place] to release information regarding their care.   Consent: Patient/Guardian gives verbal consent for treatment and assignment of benefits for services provided during this visit. Patient/Guardian expressed understanding and agreed to proceed.   Jomarie Longs, MD 11/14/202411:42 AM

## 2023-01-10 NOTE — Patient Instructions (Addendum)
Please call for EKG - [phone]     [email or web address]  [email or web address]  [email or web address] [facility] [address], [place], [place] [address]   [phone]  [facility] [email or web address] [address] [place], [place] [address]  [phone]  [facility] [email or web address] [address] [place], [place] [address]  [phone]   Family solutions - [phone]  [facility] - [phone]  [facility] - [phone] counseling [phone]  [facility] [phone]   [email or web address] this clinician can offer telehealth and has a sliding scale option  [email or web address] this group also offers sliding scale rates and is based out of [place]  Dr. Liborio Nixon with the [facility] specializes in divorce  [employer] [facility] has interns who offer sliding scale rates and some of the full time clinicians do, as well. You complete their contact form on their website and the referrals coordinator will help to get connected to someone   Medicaid below :  [facility] [address] [place], [place] [address]  [phone]    [employer] [address] [place], [place] [address]  [phone]    [facility] [address] [place], [place] [address]  [phone]   Cannabis Use Disorder Cannabis use disorder occurs when marijuana use disrupts a person's daily life or causes health problems. This condition can be dangerous. The health problems this condition can cause include: Long-lasting problems with thinking and learning. These can be permanent in young people. Mental health problems, such as anxiety disorders,  paranoia, psychosis, or schizophrenia. Dangerously high blood pressure and heart rate. Breathing problems and illness, such as bronchitis, emphysema, or lung cancer. Problems with fetal development during pregnancy and child development after pregnancy. People with this condition are also more likely to use other drugs. What are the causes? This condition is caused by using marijuana too much over time. It is not caused by using it only once in a while. Many people with this condition use marijuana because it gives them a feeling of extreme pleasure or relaxation. What increases the risk? The following factors may make a person more likely to develop this condition: Being female. Having a family history of cannabis use disorder. Having mental health issues such as depression or post-traumatic stress disorder (PTSD). What are the signs or symptoms? Symptoms of this condition include: Addiction Using marijuana in greater amounts or for longer periods of time than you want to. Craving marijuana. Spending a lot of time getting marijuana, using it, or recovering from its effects. Having problems at work, at school, at home, or in relationships because of marijuana use. Giving up or cutting down on important life activities because of marijuana use. Using marijuana at times when it is dangerous, such as while you are driving a car. Needing more and more marijuana to get the same desired effect (building up a tolerance). Lack of motivation, known as amotivational syndrome, which leads to poor school and work performance. Physical problems A long-lasting cough. Long-term lung problems and difficulty breathing. Mental problems Hallucinations. Severe anxiety. Trouble sleeping. Increase in violent behavior in young people. Withdrawal problems You may have symptoms when you stop using marijuana. Symptoms include: Irritability or anger. Anxiety or restlessness. Trouble sleeping. Loss of appetite  or weight loss. Aches and pains. Shakiness,  sweating, or chills. How is this diagnosed? This condition is diagnosed with an assessment. Your health care provider will ask about your marijuana use and how it affects your life. You will be diagnosed with the condition if you have had at least two symptoms of this condition within a 44-month period. How severe the condition is depends on how many symptoms you have. If you have two to three symptoms, your condition is mild. If you have four to five symptoms, your condition is moderate. If you have six or more symptoms, your condition is severe. A physical exam or lab tests may be done to see if you have physical problems resulting from marijuana use. Your health care provider may also screen for drug use and refer you to a mental health professional for evaluation. How is this treated? Treatment for this condition is usually provided by mental health professionals with training in substance use disorders. Your treatment may involve: Counseling. This treatment is also called talk therapy. It is provided by substance use treatment counselors. A counselor can address the reasons you use marijuana and suggest ways to keep you from using it again. The goals of talk therapy are to: Find healthy activities to replace using marijuana. Identify and avoid the things that trigger your marijuana use. Help you learn how to handle cravings. Support groups. Support groups are led by people who have quit using marijuana. They provide emotional support, advice, and guidance. Medicine. Medicine is used to treat mental health issues that trigger marijuana use or that result from it. Follow these instructions at home: Lifestyle Make healthy lifestyle choices, such as: Eating a healthy diet. Getting enough exercise. Learning skills for managing stress.  General instructions Take over-the-counter and prescription medicines, as well as any herbal remedies, only as  told by your health care provider. Check with your health care provider before starting any new medicines. Work with Photographer or group to develop tools to keep you from using marijuana again (relapsing). Learn daily living skills and work Programmer, applications. Where to find more information Centers for Disease Control and Prevention (CDC): TonerPromos.no Substance Abuse and Mental Health Services Administration: RockToxic.pl Contact a health care provider if: You are not able to take your medicines as told. Your symptoms get worse. Get help right away if: You have serious thoughts about hurting yourself or others. Get help right away if you feel like you may hurt yourself or others, or have thoughts about taking your own life. Go to your nearest emergency room or: Call 911. Call the National Suicide Prevention Lifeline at 860 552 2436 or 988. This is open 24 hours a day. Text the Crisis Text Line at 803-202-1658. This information is not intended to replace advice given to you by your health care provider. Make sure you discuss any questions you have with your health care provider. Document Revised: 07/19/2021 Document Reviewed: 07/19/2021 Elsevier Patient Education  2024 [employer].

## 2023-01-11 ENCOUNTER — Encounter: Payer: Self-pay | Admitting: Psychiatry

## 2023-01-20 ENCOUNTER — Other Ambulatory Visit: Payer: Self-pay

## 2023-01-20 ENCOUNTER — Emergency Department: Payer: 59

## 2023-01-20 ENCOUNTER — Emergency Department
Admission: EM | Admit: 2023-01-20 | Discharge: 2023-01-20 | Disposition: A | Discharge status: Home / Self Care | Payer: 59 | Attending: Emergency Medicine | Admitting: Emergency Medicine

## 2023-01-20 ENCOUNTER — Encounter: Payer: Self-pay | Admitting: Emergency Medicine

## 2023-01-20 DIAGNOSIS — I10 Essential (primary) hypertension: Secondary | ICD-10-CM | POA: Diagnosis not present

## 2023-01-20 DIAGNOSIS — W01190A Fall on same level from slipping, tripping and stumbling with subsequent striking against furniture, initial encounter: Secondary | ICD-10-CM | POA: Insufficient documentation

## 2023-01-20 DIAGNOSIS — S9304XA Dislocation of right ankle joint, initial encounter: Secondary | ICD-10-CM | POA: Diagnosis not present

## 2023-01-20 DIAGNOSIS — S93324A Dislocation of tarsometatarsal joint of right foot, initial encounter: Secondary | ICD-10-CM

## 2023-01-20 DIAGNOSIS — S99911A Unspecified injury of right ankle, initial encounter: Secondary | ICD-10-CM | POA: Diagnosis present

## 2023-01-20 MED ORDER — ACETAMINOPHEN 325 MG PO TABS
650.0000 mg | ORAL_TABLET | Freq: Once | ORAL | Status: AC
  Administered 2023-01-20: 650 mg via ORAL
  Filled 2023-01-20: qty 2

## 2023-01-20 NOTE — ED Triage Notes (Signed)
Pt in via EMS from home with c/o mechanical fall and right ankle pain. Pt with limited ROM in her foot, hx of surgery to that ankle. No LOC. 182/79, HR 60, 97% RA, CBG 149, #20g to left AC,pt was given of fentanyl

## 2023-01-20 NOTE — ED Triage Notes (Signed)
Pt reports she was walking down the stairs, thought she had stepped on the last step but there were 2 more left and she fell twisting her right ankle. Pt reports cannot put weight on it. First responders splinted it. Denies LOC or hitting head.

## 2023-01-20 NOTE — Discharge Instructions (Signed)
Please continue to wear the boot, and keep your leg elevated.  You may apply ice, 20 minutes on, 20 minutes off multiple times per day.  Do not bear weight on your right leg.  The podiatry office will call you tomorrow.  Please return for any new, worsening, or change in symptoms or other concerns.  It was a pleasure caring for you today.

## 2023-01-20 NOTE — ED Provider Notes (Signed)
[facility] Provider Note    Event Date/Time   First MD Initiated Contact with Patient 01/20/23 1457     (approximate)   History   Ankle Pain (fal) and Fall   HPI  Sandra Charles is a 67 y.o. female with a past medical history of schizoaffective disorder, hyperlipidemia, hypertension, trimalleolar fracture status post ORIF on 01/20/2021 with a complicated recovery including wounds that developed along the medial and lateral aspect of the ankle, treated with Bactrim for 1 month with frequent wet-to-dry dressings, he presents today for evaluation after a fall and right ankle pain.  Patient reports that she lost her balance and slipped down an incline and is now unable to bear weight on her right ankle.  She reports that she also landed on her right shoulder and has pain in that location as well.  She did not strike her head or lose consciousness.  She has not on anticoagulation.  She denies any numbness or tingling.  Patient Active Problem List   Diagnosis Date Noted   Long term current use of cannabis 01/10/2023   At risk for prolonged QT interval syndrome 01/10/2023   High risk medication use 01/10/2023   Cognitive disorder 01/30/2022   Dyslipidemia 01/30/2022   GERD without esophagitis 01/30/2022   Alcohol use 01/29/2022   Depression with anxiety 01/29/2022   Lewy body dementia (HCC) 01/29/2022   Leukocytosis 01/29/2022   Stroke (HCC) 01/29/2022   Overweight (BMI 25.0-29.9) 01/29/2022   Trimalleolar fracture of ankle, closed, right, initial encounter 01/19/2021   Seizures (HCC) 08/26/2020   Aspiration into airway    Acute pulmonary edema (HCC)    Tetrahydrocannabinol (THC) dependence (HCC)    HLD (hyperlipidemia) 10/05/2017   Seizure (HCC)    UTI (urinary tract infection) 08/17/2015   Seizure disorder (HCC) 08/10/2015   Cannabis use disorder, moderate, dependence (HCC) 08/10/2015   Schizoaffective disorder, bipolar type (HCC) 08/10/2015   Abnormal  finding on MRI of brain    Acute metabolic encephalopathy 07/12/2015   GERD (gastroesophageal reflux disease) 11/25/2014   Essential hypertension 11/25/2014          Physical Exam   Triage Vital Signs: ED Triage Vitals  Encounter Vitals Group     BP 01/20/23 1354 (!) 175/68     Systolic BP Percentile --      Diastolic BP Percentile --      Pulse Rate 01/20/23 1352 66     Resp 01/20/23 1352 20     Temp 01/20/23 1352 98.2 F (36.8 C)     Temp Source 01/20/23 1352 Oral     SpO2 01/20/23 1354 95 %     Weight 01/20/23 1353 187 lb 6.3 oz (85 kg)     Height 01/20/23 1353 5\' 1"  (1.549 m)     Head Circumference --      Peak Flow --      Pain Score 01/20/23 1353 8     Pain Loc --      Pain Education --      Exclude from Growth Chart --     Most recent vital signs: Vitals:   01/20/23 1352 01/20/23 1354  BP:  (!) 175/68  Pulse: 66   Resp: 20   Temp: 98.2 F (36.8 C)   SpO2: 95% 95%    Physical Exam Vitals and nursing note reviewed.  Constitutional:      General: Awake and alert. No acute distress.    Appearance: Normal appearance. The patient  is normal weight.  HENT:     Head: Normocephalic and atraumatic.     Mouth: Mucous membranes are moist.  Eyes:     General: PERRL. Normal EOMs        Right eye: No discharge.        Left eye: No discharge.     Conjunctiva/sclera: Conjunctivae normal.  Cardiovascular:     Rate and Rhythm: Normal rate and regular rhythm.     Pulses: Normal pulses.  Pulmonary:     Effort: Pulmonary effort is normal. No respiratory distress.     Breath sounds: Normal breath sounds.  Abdominal:     Abdomen is soft. There is no abdominal tenderness. No rebound or guarding. No distention. Musculoskeletal:        General: No swelling. Normal range of motion.     Cervical back: Normal range of motion and neck supple. Right foot: Well-healed surgical incision noted to the ankle.  There is no redness or warmth.  Tenderness over the dorsum of the  foot, though 2+ pedal pulses and normal capillary refill.  She has pain with range of motion but is able to range her foot.  No calcaneal tenderness.  No proximal fibular tenderness.  Compartments are soft compressible throughout.  No open wounds. Skin:    General: Skin is warm and dry.     Capillary Refill: Capillary refill takes less than 2 seconds.     Findings: No rash.  Neurological:     Mental Status: The patient is awake and alert.      ED Results / Procedures / Treatments   Labs (all labs ordered are listed, but only abnormal results are displayed) Labs Reviewed - No data to display   EKG     RADIOLOGY I independently reviewed and interpreted imaging and agree with radiologists findings.     PROCEDURES:  Critical Care performed:   Procedures   MEDICATIONS ORDERED IN ED: Medications  acetaminophen (TYLENOL) tablet 650 mg (650 mg Oral Given 01/20/23 1607)     IMPRESSION / MDM / ASSESSMENT AND PLAN / ED COURSE  I reviewed the triage vital signs and the nursing notes.   Differential diagnosis includes, but is not limited to, fracture, dislocation, periprosthetic fracture, contusion, sprain.  I reviewed the patient's chart.  Patient was seen most recently by orthopedic surgery on 08/28/2021 at which time x-rays revealed a well-healed ankle fracture and no clinical findings of infection.  Patient presents emergency department today awake and alert, hemodynamically stable and afebrile.  She is neurovascularly intact.  There are no open wounds on her ankle.  Ankle x-ray obtained in triage reveals no hardware complication, though there does reveal a subcortical lucency along the medial talar dome which could represent an osteochondral lesion, as well as asymmetric sclerosis of the tibial plafond which could reflect avascular necrosis versus sequela of prior trauma.  I did discuss this with her primary surgeon, Dr. Joice Lofts who has requested a CT scan of the ankle.  CT  ankle demonstrates a Lisfranc fracture subluxation.  This was discussed with Dr. Logan Bores with podiatry who feels that this is appropriate for outpatient follow-up.  He will have his office call tomorrow.  Patient was placed in a boot and instructed to remain nonweightbearing as recommended by Dr. Logan Bores.  Recommended keeping her leg elevated and applying cool compresses.  Her pain was controlled with Tylenol with good effect.  Patient is in agreement with this plan.  Discussed findings and recommendations with the  patient's son, Apolinar Junes who also agrees.  Patient was discharged with [facility].   Patient's presentation is most consistent with acute complicated illness / injury requiring diagnostic workup.   Clinical Course as of 01/20/23 2043  Wynelle Link Jan 20, 2023  1515 Discussed with primary surgeon, Dr. Kassie Mends has requested a CT ankle [JP]  1801 Discussed CT results with Dr. Logan Bores who agrees with cam boot, nonweightbearing, and he will have the office call her tomorrow [JP]  1802 Discussed with son Apolinar Junes who is in agreement with plan and will come to pick her up [JP]  1813 Patient was reportedly squatting down to put her sock on when she reports that she squatted too low and landed on her buttocks.  She denies head strike or LOC.  She denies any new pain to her foot.  She reports that she feels completely fine and did not injure anything [JP]    Clinical Course User Index [JP] Maddi Collar, Herb Grays, PA-C     FINAL CLINICAL IMPRESSION(S) / ED DIAGNOSES   Final diagnoses:  Lisfranc dislocation, right, initial encounter     Rx / DC Orders   ED Discharge Orders     None        Note:  This document was prepared using Dragon voice recognition software and may include unintentional dictation errors.   Keturah Shavers 01/20/23 2043    Concha Se, MD 01/23/23 [phone]

## 2023-01-20 NOTE — ED Notes (Signed)
Entered pt room approximately 18:15. Pt was behind the door on her bottom reaching for shelf. Notified the nurse and ED provider. We assisted her up to a chair.

## 2023-01-20 NOTE — ED Notes (Signed)
Called to room by NT [facility] who stated that pt was in the floor. Pt found in seated position behind the door. Stating that she was trying to get out of bed and fell on her bottom and crawled to behind the door. Pt denies new injury. Denies hitting her head. PA Jenna P. Called to bedside to assess as well as primary RN Margaree Mackintosh.

## 2023-01-20 NOTE — ED Notes (Signed)
Safety zone submitted by this RN.

## 2023-01-20 NOTE — ED Notes (Signed)
Patient transported to X-ray 

## 2023-01-20 NOTE — ED Notes (Signed)
See triage note   Presents with pain to right foot and ankle   States she missed the last step  Twisted ankle  Good pulses

## 2023-01-29 ENCOUNTER — Ambulatory Visit: Payer: 59 | Admitting: Podiatry

## 2023-01-29 ENCOUNTER — Encounter: Payer: Self-pay | Admitting: Podiatry

## 2023-01-29 VITALS — Ht 61.0 in | Wt 187.4 lb

## 2023-01-29 DIAGNOSIS — S93621A Sprain of tarsometatarsal ligament of right foot, initial encounter: Secondary | ICD-10-CM

## 2023-01-29 MED ORDER — OXYCODONE-ACETAMINOPHEN 5-325 MG PO TABS: 1.0000 | ORAL_TABLET | ORAL | 0 refills | Status: AC | PRN

## 2023-01-29 NOTE — Progress Notes (Unsigned)
Subjective:  Patient ID: Sandra Charles, female    DOB: [birth year],  MRN: [id number]  Chief Complaint  Patient presents with   Foot Injury    NP is here due to right foot lisfranc dislocation, pt states she slip and feel last week and injured her right foot was seen in the ED for to same x-rays were done and was told she had dislocation to top of right foot, was given boot to wear.    67 y.o. female presents with the above complaint.  Patient presents with complaint of Lisfranc injury to her right foot.  Patient states that she slipped and fell last week injured her right foot.  She went to the emergency room x-rays were done.  She was told that she has a Lisfranc injury.  She has not seen anyone else prior to seeing me for this.  Denies any other acute complaints she has CT scan done pain scale 7 out of 10 dull aching nature   Review of Systems: Negative except as noted in the HPI. Denies N/V/F/Ch.  Past Medical History:  Diagnosis Date   Anxiety    Arthritis    "all over"   Chronic lower back pain    Dementia (HCC)    Depression    GERD (gastroesophageal reflux disease)    Headache    "weekly" (07/15/2015)   Hyperlipidemia    Hypertension    Lewy body dementia (HCC)    Mini stroke    "several since 05/2014" (07/15/2015)   Psychosis in elderly, without behavioral disturbance (HCC)    Seizures (HCC) dx'd 04/2015    Current Outpatient Medications:    amLODipine (NORVASC) 10 MG tablet, Take 10 mg by mouth daily. For high blood pressure, Disp: , Rfl:    donepezil (ARICEPT) 10 MG tablet, Take 10 mg by mouth at bedtime., Disp: , Rfl:    lamoTRIgine (LAMICTAL) 100 MG tablet, Take 1 tablet (100 mg total) by mouth 2 (two) times daily. Along with 25 mg tablet for total of 125 mg bid., Disp: 60 tablet, Rfl: 0   lamoTRIgine (LAMICTAL) 25 MG tablet, Take 1 tablet (25 mg total) by mouth 2 (two) times daily. Along with 100 mg tablet for total of 125 mg bid., Disp: 60 tablet, Rfl: 0   losartan  (COZAAR) 25 MG tablet, Take 25 mg by mouth daily., Disp: , Rfl:    oxyCODONE-acetaminophen (PERCOCET) 5-325 MG tablet, Take 1 tablet by mouth every 4 (four) hours as needed for severe pain (pain score 7-10)., Disp: 30 tablet, Rfl: 0   pantoprazole (PROTONIX) 40 MG tablet, Take 1 tablet (40 mg total) by mouth daily., Disp: 30 tablet, Rfl: 11   propranolol ER (INDERAL LA) 60 MG 24 hr capsule, Take 60 mg by mouth daily., Disp: , Rfl: 11   QUEtiapine (SEROQUEL) 50 MG tablet, Take 1 tablet (50 mg total) by mouth at bedtime., Disp: 30 tablet, Rfl: 3   rosuvastatin (CRESTOR) 20 MG tablet, Take 20 mg by mouth daily., Disp: , Rfl:    sertraline (ZOLOFT) 100 MG tablet, Take 100 mg by mouth daily., Disp: , Rfl: 2   cloBAZam (ONFI) 10 MG tablet, Take 0.5 tablets (5 mg total) by mouth at bedtime., Disp: 15 tablet, Rfl: 0  Social History   Tobacco Use  Smoking Status Former   Current packs/day: 1.00   Average packs/day: 1 pack/day for 5.0 years (5.0 ttl pk-yrs)   Types: Cigarettes  Smokeless Tobacco Never  Tobacco Comments   "quit  smoking cigarettes in the early 2000s"    Allergies  Allergen Reactions   Lovastatin Other (See Comments)    Unknown reaction   Objective:  There were no vitals filed for this visit. Body mass index is 35.41 kg/m. Constitutional Well developed. Well nourished.  Vascular Dorsalis pedis pulses palpable bilaterally. Posterior tibial pulses palpable bilaterally. Capillary refill normal to all digits.  No cyanosis or clubbing noted. Pedal hair growth normal.  Neurologic Normal speech. Oriented to person, place, and time. Epicritic sensation to light touch grossly present bilaterally.  Dermatologic Nails well groomed and normal in appearance. No open wounds. No skin lesions.  Orthopedic: Pain on palpation to the right dorsal midfoot.  Pain with ambulation.  No ecchymosis bruising noted mild swelling noted.  No open wounds or lesion noted no signs of infection noted    Radiographs:  Multiple acute fractures noted:   Mildly impacted fracture of the medial cuneiform articular surface at the TMT joint. Small avulsion fracture fragments at the dorsal aspect of the medial cuneiform, dorsal and plantar aspects of the second metatarsal base, and dorsal aspect of the lateral cuneiform. Acute nondisplaced fracture at the base of the fourth metatarsal.   There is slight asymmetric widening and lateral subluxation of the first and second TMT joints.   Healed medial and lateral malleolar fractures status post ORIF. No evidence of hardware failure or loosening. Associated heterotopic ossification. Chronic depression of the tibial plafond articular surface. 8 x 6 x 6 mm lucency in the medial talar dome, consistent with osteochondral lesion. No joint effusion.   Ligaments   Ligaments are suboptimally evaluated by CT.   Muscles and Tendons Grossly intact.   Soft tissue Dorsal foot soft tissue swelling. No fluid collection or hematoma. No soft tissue mass.   IMPRESSION: 1. Acute Lisfranc fracture-subluxation as described above. 2. 8 mm osteochondral lesion in the medial talar dome. 3. Healed medial and lateral malleolar fractures status post ORIF. Assessment:   1. Lisfranc's sprain, right, initial encounter    Plan:  Patient was evaluated and treated and all questions answered.  Right foot Lisfranc injury nondisplaced -All questions and concerns were discussed with the patient in extensive detail.  At this time I discussed with her extensively we can continue treating this nonoperatively as her Lisfranc injury is not displaced.  Her base of the second is perfectly aligned with the middle cuneiform.  Unable to clinically or radiographically appreciate dislocation.  At this time I discussed conservative care she states understanding she can be nonweightbearing to the right lower extremity with a cam boot for next 4 weeks followed by weightbearing as  tolerated.  She states understanding -Percocet for pain control  No follow-ups on file.

## 2023-02-14 ENCOUNTER — Telehealth: Payer: Self-pay | Admitting: Psychiatry

## 2023-02-14 DIAGNOSIS — F250 Schizoaffective disorder, bipolar type: Secondary | ICD-10-CM

## 2023-02-14 MED ORDER — QUETIAPINE FUMARATE 50 MG PO TABS: 50.0000 mg | ORAL_TABLET | Freq: Every day | ORAL | 1 refills | Status: DC

## 2023-02-14 NOTE — Addendum Note (Signed)
Addended byJomarie Longs on: 02/14/2023 04:31 PM   Modules accepted: Orders

## 2023-02-14 NOTE — Telephone Encounter (Signed)
I have sent Seroquel 50 mg to [facility] on [address].

## 2023-02-14 NOTE — Telephone Encounter (Signed)
Called pharmacy as instructed by provider spoke to [facility] she stated the patient is currently taking Seroquel Immediate release 50 mg  take one tablet by mouth at bedtime

## 2023-02-14 NOTE — Telephone Encounter (Signed)
Contacted patient/niece-wanted to clarify whether patient is on Seroquel immediate release or the extended release 50 mg.  According to niece she is not sure.  Will have staff contact pharmacy to clarify this before sending in a  prescription refill.

## 2023-03-07 ENCOUNTER — Encounter: Payer: 59 | Admitting: Podiatry

## 2023-04-24 ENCOUNTER — Encounter: Payer: Self-pay | Admitting: Psychiatry

## 2023-04-24 ENCOUNTER — Ambulatory Visit (INDEPENDENT_AMBULATORY_CARE_PROVIDER_SITE_OTHER): Payer: 59 | Admitting: Psychiatry

## 2023-04-24 VITALS — BP 118/84 | HR 68 | Temp 97.4°F | Ht 61.0 in | Wt 187.6 lb

## 2023-04-24 DIAGNOSIS — F250 Schizoaffective disorder, bipolar type: Secondary | ICD-10-CM | POA: Diagnosis not present

## 2023-04-24 DIAGNOSIS — Z634 Disappearance and death of family member: Secondary | ICD-10-CM | POA: Insufficient documentation

## 2023-04-24 DIAGNOSIS — F09 Unspecified mental disorder due to known physiological condition: Secondary | ICD-10-CM | POA: Diagnosis not present

## 2023-04-24 DIAGNOSIS — F1290 Cannabis use, unspecified, uncomplicated: Secondary | ICD-10-CM | POA: Diagnosis not present

## 2023-04-24 MED ORDER — LORAZEPAM 1 MG PO TABS: 0.5000 mg | ORAL_TABLET | Freq: Every day | ORAL | 0 refills | Status: AC | PRN

## 2023-04-24 MED ORDER — SERTRALINE HCL 100 MG PO TABS: 150.0000 mg | ORAL_TABLET | Freq: Every day | ORAL | 1 refills | Status: DC

## 2023-04-24 NOTE — Patient Instructions (Addendum)
 Please limit Lorazepam use , since you are also on Clobazam. Please start therapy for grief. [email or web address]  [employer] [address], [place], [place] [address]   [phone]   Lorazepam Tablets What is this medication? LORAZEPAM (lor A ze pam) treats anxiety. It works by Education administrator system slow down. It belongs to a group of medications called benzodiazepines. This medicine may be used for other purposes; ask your health care provider or pharmacist if you have questions. COMMON BRAND NAME(S): Ativan What should I tell my care team before I take this medication? They need to know if you have any of these conditions: Glaucoma Kidney disease Liver disease Lung or breathing disease, such as asthma or COPD Mental health condition Myasthenia gravis Sleep apnea Substance use disorder Suicidal thoughts, plans, or attempt by you or a family member An unusual or allergic reaction to lorazepam, other medications, foods, dyes, or preservatives Pregnant or trying to get pregnant Breast-feeding How should I use this medication? Take this medication by mouth with water. Take it as directed on the prescription label at the same time every day. Keep taking it unless your care team tells you to stop. A special MedGuide will be given to you by the pharmacist with each prescription and refill. Be sure to read this information carefully each time. Talk to your care team about the use of this medication in children. While this medication may be used in children as young as 12 years for selected conditions, precautions do apply. Overdosage: If you think you have taken too much of this medicine contact a poison control center or emergency room at once. NOTE: This medicine is only for you. Do not share this medicine with others. What if I miss a dose? If you miss a dose, take it as soon as you can. If it is almost time for your next dose, take only that  dose. Do not take double or extra doses. What may interact with this medication? Do not take this medication with any of the following: Calcium, Magnesium, Potassium, Sodium oxybates Sodium oxybate This medication may also interact with the following: Alcohol Certain antihistamines Certain medications for depression, such as amitriptyline or trazodone Certain medications for seizures, such as phenobarbital or primidone Divalproex sodium Medications that cause drowsiness before a procedure, such as propofol Medications that help you fall asleep Medications that relax muscles MAOIs, such as Marplan, Nardil, and Parnate Opioids for pain or cough Phenothiazines, such as chlorpromazine, prochlorperazine, thioridazine Probenecid Valproate Valproic acid This list may not describe all possible interactions. Give your health care provider a list of all the medicines, herbs, non-prescription drugs, or dietary supplements you use. Also tell them if you smoke, drink alcohol, or use illegal drugs. Some items may interact with your medicine. What should I watch for while using this medication? Visit your care team for regular checks on your progress. Tell your care team if your symptoms do not start to get better or if they get worse. This medication may affect your coordination, reaction time, or judgment. Do not drive or operate machinery until you know how this medication affects you. Sit up or stand slowly to reduce the risk of dizzy or fainting spells. Drinking alcohol with this medication can increase the risk of these side effects. If you take other medications that also cause drowsiness such as other opioid pain medications, benzodiazepines, or other medications for sleep, you may have more side effects. Give your care team a list  of all medications you use. They will tell you how much medication to take. Do not take more medication than directed. Call emergency services if you have problems  breathing or are unusually tired or sleepy. If you or your family notice any changes in your behavior, such as new or worsening depression, thoughts of harming yourself, anxiety, other unusual or disturbing thoughts, or memory loss, call your care team right away. This medication has a risk of abuse and dependence. Your care team will check you for this while you take this medication. Long term use of this medication may cause your brain and body to depend on it. This can happen even when used as directed by your care team. You and your care team will work together to determine how long you will need to take this medication. If your care team wants you to stop this medication, the dose will be slowly lowered over time to reduce the risk of side effects. Talk to your care team if you wish to become pregnant or think you might be pregnant. Talk to your care team before breastfeeding. Changes to your treatment plan may be needed. What side effects may I notice from receiving this medication? Side effects that you should report to your care team as soon as possible: Allergic reactions--skin rash, itching, hives, swelling of the face, lips, tongue, or throat CNS depression--slow or shallow breathing, shortness of breath, feeling faint, dizziness, confusion, difficulty staying awake Thoughts of suicide or self-harm, worsening mood, feelings of depression Side effects that usually do not require medical attention (report to your care team if they continue or are bothersome): Dizziness Drowsiness Headache Nausea Vomiting This list may not describe all possible side effects. Call your doctor for medical advice about side effects. You may report side effects to FDA at 1-800-FDA-1088. Where should I keep my medication? Keep out of the reach of children. This medication can be abused. Keep your medication in a safe place to protect it from theft. Do not share this medication with anyone. Selling or giving away  this medication is dangerous and against the law. Store at room temperature between 20 and 25 degrees C (68 and 77 degrees F). Protect from light. Keep container tightly closed. This medication may cause accidental overdose and death if taken by other adults, children, or pets. Mix any unused medication with a substance like cat litter or coffee grounds. Then throw the medication away in a sealed container like a sealed bag or a coffee can with a lid. Do not use the medication after the expiration date. NOTE: This sheet is a summary. It may not cover all possible information. If you have questions about this medicine, talk to your doctor, pharmacist, or health care provider.  2024 Elsevier/Gold Standard (2021-07-20 00:00:00)

## 2023-04-24 NOTE — Progress Notes (Signed)
 BH MD OP Progress Note  04/24/2023 11:28 AM Sandra Charles  MRN:  [id number]  Chief Complaint:  Chief Complaint  Patient presents with   Follow-up   Depression   Grief reaction   Medication Refill   HPI: Sandra Charles is a 68 year old African-American female, currently lives in [place], on [place], has a history of schizoaffective disorder, probable Lewy body dementia, hypertension, dyslipidemia, seizure disorder, prediabetes, gastroesophageal reflux disease was evaluated in office today.  Patient being a limited historian majority of information obtained from niece Sandra Charles.  She is experiencing significant anxiety and grief following the recent loss of her partner of 41 years. The emotional impact is severe, with symptoms including crying and expressions of grief. She has been taking previously prescribed Xanax 1 mg for emergency use due to the severity of her symptoms, but finds it slow to take effect. She is also on Clobazam ,prescribed as 10 mg (although currently taking only 5 mg) for her history of seizures.  She is compliant on her medications like sertraline 100 mg daily, Seroquel 50 mg at bedtime.  Denies any side effects.  As per niece she continues to have memory issues, such as forgetting events that occurred two minutes prior, and is concerned about her memory's decline.  She does have a history of seizure disorder probable Lewy body dementia and is currently under the care of neurology.  As per niece patient does have problems with walking which comes and goes agrees to follow up with neurology.  This needs to be explored more in future sessions to rule out functional neurological symptoms disorder.  Her son and her niece currently stays with her on alternate days to support her.  Since patient is significantly struggling with grief reaction niece wonders if she can be provided an emergency supply of an as needed medication similar to Xanax to get through this week since funeral  arrangements are being made for her late partner who passed away 2023-05-12.  Patient currently denies any suicidality, homicidality or perceptual disturbances.  She appeared to be alert, oriented to person place and situation.   Visit Diagnosis: Rule out Functional neurological symptoms disorder   ICD-10-CM   1. Schizoaffective disorder, bipolar type (HCC)  F25.0 sertraline (ZOLOFT) 100 MG tablet    LORazepam (ATIVAN) 1 MG tablet    2. Bereavement  Z63.4 sertraline (ZOLOFT) 100 MG tablet    LORazepam (ATIVAN) 1 MG tablet    3. Long term current use of cannabis  F12.90     4. Cognitive disorder  F09    Probable Lewy body currently under the care of [facility] neurology      Past Psychiatric History: I have reviewed past psychiatric history from progress note on 01/10/2023.  Patient with multiple inpatient behavioral health admissions most recent 1 in 2023 at [facility].  Patient denies suicide attempts.  Past medication trials include Celexa, Xanax, Depakote, Lamictal  Past Medical History:  Past Medical History:  Diagnosis Date   Anxiety    Arthritis    "all over"   Chronic lower back pain    Dementia (HCC)    Depression    GERD (gastroesophageal reflux disease)    Headache    "weekly" (07/15/2015)   Hyperlipidemia    Hypertension    Lewy body dementia (HCC)    Mini stroke    "several since 05/2014" (07/15/2015)   Psychosis in elderly, without behavioral disturbance (HCC)    Seizures (HCC) dx'd 04/2015    Past Surgical  History:  Procedure Laterality Date   COLONOSCOPY WITH PROPOFOL N/A 11/20/2021   Procedure: COLONOSCOPY WITH PROPOFOL;  Surgeon: Regis Bill, MD;  Location: [facility] ENDOSCOPY;  Service: Endoscopy;  Laterality: N/A;   DILATION AND CURETTAGE OF UTERUS     ESOPHAGOGASTRODUODENOSCOPY (EGD) WITH PROPOFOL N/A 11/20/2021   Procedure: ESOPHAGOGASTRODUODENOSCOPY (EGD) WITH PROPOFOL;  Surgeon: Regis Bill, MD;  Location: [facility] ENDOSCOPY;  Service: Endoscopy;  Laterality:  N/A;   FRACTURE SURGERY     LUMBAR PUNCTURE  07/14/2015   ORIF ANKLE FRACTURE Right 01/20/2021   Procedure: OPEN REDUCTION INTERNAL FIXATION (ORIF) ANKLE FRACTURE;  Surgeon: Christena Flake, MD;  Location: [facility] ORS;  Service: Orthopedics;  Laterality: Right;   TUBAL LIGATION     VAGINAL HYSTERECTOMY      Family Psychiatric History: I have reviewed family psychiatric history from progress note on 01/10/2023  Family History:  Family History  Problem Relation Age of Onset   Hyperlipidemia Mother    Breast cancer Mother 35   Hypertension Mother    Heart attack Mother        age 71's   Hyperlipidemia Father    Hypertension Father    Heart attack Father 27   Breast cancer Sister 7   Mental illness No known conditions     Social History: I have reviewed social history from progress note on 01/10/2023 Social History   Socioeconomic History   Marital status: Single    Spouse name: Not on file   Number of children: 1   Years of education: Not on file   Highest education level: Not on file  Occupational History   Occupation: disabled  Tobacco Use   Smoking status: Former    Current packs/day: 1.00    Average packs/day: 1 pack/day for 5.0 years (5.0 ttl pk-yrs)    Types: Cigarettes   Smokeless tobacco: Never   Tobacco comments:    "quit smoking cigarettes in the early 2000s"  Vaping Use   Vaping status: Never Used  Substance and Sexual Activity   Alcohol use: Not Currently    Comment: 07/15/2015 "nothing in the last few years"   Drug use: No   Sexual activity: Not Currently  Other Topics Concern   Not on file  Social History Narrative   Lives with significant other, has son who comes by daily.   Social Drivers of Corporate investment banker Strain: Low Risk  (10/05/2017)   Overall Financial Resource Strain (CARDIA)    Difficulty of Paying Living Expenses: Not hard at all  Food Insecurity: No Food Insecurity (01/30/2022)   Hunger Vital Sign    Worried About Running Out  of Food in the Last Year: Never true    Ran Out of Food in the Last Year: Never true  Transportation Needs: No Transportation Needs (01/30/2022)   PRAPARE - Administrator, Civil Service (Medical): No    Lack of Transportation (Non-Medical): No  Physical Activity: Inactive (10/05/2017)   Exercise Vital Sign    Days of Exercise per Week: 0 days    Minutes of Exercise per Session: 0 min  Stress: No Stress Concern Present (10/05/2017)   [employer] of Occupational Health - Occupational Stress Questionnaire    Feeling of Stress : Not at all  Social Connections: Moderately Isolated (10/05/2017)   Social Connection and Isolation Panel [NHANES]    Frequency of Communication with Friends and Family: Once a week    Frequency of Social Gatherings with Friends and  Family: Once a week    Attends Religious Services: Never    Active Member of Clubs or Organizations: No    Attends Banker Meetings: Never    Marital Status: Living with partner    Allergies:  Allergies  Allergen Reactions   Lovastatin Other (See Comments)    Unknown reaction    Metabolic Disorder Labs: Lab Results  Component Value Date   HGBA1C 5.6 07/05/2021   MPG 114.02 07/05/2021   Lab Results  Component Value Date   PROLACTIN 9.4 08/10/2015   PROLACTIN 7.1 07/12/2015   Lab Results  Component Value Date   CHOL 190 07/05/2021   TRIG 100 07/05/2021   HDL 49 07/05/2021   CHOLHDL 3.9 07/05/2021   VLDL 20 07/05/2021   LDLCALC 121 (H) 07/05/2021   LDLCALC 48 08/10/2015   Lab Results  Component Value Date   TSH 0.934 08/27/2020   TSH 6.026 (H) 03/27/2018    Therapeutic Level Labs: No results found for: "LITHIUM" Lab Results  Component Value Date   VALPROATE <10 (L) 01/29/2022   VALPROATE <10 (L) 01/29/2022   No results found for: "CBMZ"  Current Medications: Current Outpatient Medications  Medication Sig Dispense Refill   amLODipine (NORVASC) 10 MG tablet Take 10 mg by mouth  daily. For high blood pressure     donepezil (ARICEPT) 10 MG tablet Take 10 mg by mouth at bedtime.     lamoTRIgine (LAMICTAL) 100 MG tablet Take 1 tablet (100 mg total) by mouth 2 (two) times daily. Along with 25 mg tablet for total of 125 mg bid. 60 tablet 0   lamoTRIgine (LAMICTAL) 25 MG tablet Take 1 tablet (25 mg total) by mouth 2 (two) times daily. Along with 100 mg tablet for total of 125 mg bid. 60 tablet 0   LORazepam (ATIVAN) 1 MG tablet Take 0.5-1 tablets (0.5-1 mg total) by mouth daily as needed for up to 7 days for anxiety. grief reaction 7 tablet 0   losartan (COZAAR) 25 MG tablet Take 25 mg by mouth daily.     oxyCODONE-acetaminophen (PERCOCET) 5-325 MG tablet Take 1 tablet by mouth every 4 (four) hours as needed for severe pain (pain score 7-10). 30 tablet 0   pantoprazole (PROTONIX) 40 MG tablet Take 1 tablet (40 mg total) by mouth daily. 30 tablet 11   propranolol ER (INDERAL LA) 60 MG 24 hr capsule Take 60 mg by mouth daily.  11   QUEtiapine (SEROQUEL) 50 MG tablet Take 1 tablet (50 mg total) by mouth at bedtime. 90 tablet 1   rosuvastatin (CRESTOR) 20 MG tablet Take 20 mg by mouth daily.     Semaglutide,0.25 or 0.5MG /DOS, 2 MG/3ML SOPN Inject into the skin.     cloBAZam (ONFI) 10 MG tablet Take 0.5 tablets (5 mg total) by mouth at bedtime. 15 tablet 0   sertraline (ZOLOFT) 100 MG tablet Take 1.5 tablets (150 mg total) by mouth daily. 45 tablet 1   No current facility-administered medications for this visit.     Musculoskeletal: Strength & Muscle Tone:  baseline Gait & Station:  in a wheelchair Patient leans: N/A  Psychiatric Specialty Exam: Review of Systems  Psychiatric/Behavioral:         Grieving    Blood pressure 118/84, pulse 68, temperature (!) 97.4 F (36.3 C), temperature source Skin, height 5\' 1"  (1.549 m), weight 187 lb 9.6 oz (85.1 kg), SpO2 98%.Body mass index is 35.45 kg/m.  General Appearance: Casual  Eye Contact:  Minimal  Speech:  Normal Rate   Volume:  Decreased  Mood:   Grieving  Affect:  Tearful  Thought Process:  Linear and Descriptions of Associations: Intact  Orientation:  Other:  Self ,situation  Thought Content: Rumination   Suicidal Thoughts:  No  Homicidal Thoughts:  No  Memory:  Immediate;   Fair  Judgement:  Fair  Insight:  Shallow  Psychomotor Activity:  Normal  Concentration:  Concentration: Poor and Attention Span: Poor  Recall:  Poor  Fund of Knowledge: Poor  Language: Fair  Akathisia:  No  Handed:  Left  AIMS (if indicated): not done  Assets:  Desire for Improvement Social Support Transportation  ADL's:  Intact  Cognition: baseline  Sleep:  Fair   Screenings: AIMS    Flowsheet Row Admission (Discharged) from 08/10/2015 in [facility] INPATIENT BEHAVIORAL MEDICINE  AIMS Total Score 0      AUDIT    Flowsheet Row Admission (Discharged) from 07/02/2021 in [facility] Admission (Discharged) from 08/10/2015 in [facility] INPATIENT BEHAVIORAL MEDICINE  Alcohol Use Disorder Identification Test Final Score (AUDIT) 0 0      GAD-7    Flowsheet Row Office Visit from 01/10/2023 in [facility]  Total GAD-7 Score 3      PHQ2-9    Flowsheet Row Office Visit from 01/10/2023 in [facility] Office Visit from 12/14/2014 in [facility] - [facility] Dept Of [place] Office Visit from 11[facility]09/2014 in [facility] - A Dept Of [facility]. [facility] Office Visit from 10/18/2014 in [facility] - A Dept Of [facility]. [facility]  PHQ-2 Total Score 1 0 0 0  PHQ-9 Total Score 3 -- -- --      Flowsheet Row Office Visit from 04/24/2023 in [facility] ED from 01/20/2023 in [facility] at [facility] Visit from 01/10/2023 in [facility]  C-SSRS RISK CATEGORY No Risk No Risk No Risk        Assessment and Plan: Sandra Charles is a 68 year old African-American female with history of schizoaffective disorder, seizure disorder, possible Lewy body dementia, hypertension, hyperlipidemia, gastroesophageal reflux disease was evaluated in office today.  Discussed assessment and plan as noted below.  Schizoaffective disorder bipolar type-unstable Current mood symptoms exacerbated by recent bereavement.  Discussed risk of combining benzodiazepines and recommended lorazepam as a short-term solution.  Emphasized addressing underlying anxiety and mood symptoms through therapy and medication adjustment. - Continue Seroquel extended release 50 mg at bedtime - Increase Sertraline to 150 mg daily - Patient is also on medications like Lamictal which is prescribed by neurology for seizures although it is a mood stabilizer as well.  Bereavement-unstable Experiencing significant grief and bereavement following the recent death of her partner of 41 years, who died of a massive myocardial infarction. This event has led to emotional distress and challenges in coping with daily activities. Emphasized the importance of allowing emotions to be expressed and encouraged seeking support from a therapist or spiritual advisor. Discussed the availability of hospice counseling for additional support. - Provide referral to a therapist for counseling and support - Provide contact information for hospice counseling - Encourage participation in church activities or finding a spiritual advisor for support - Start Lorazepam 0.5-1 mg daily as needed for the next 7 days.  Advised to limit  use and discussed drug to drug interaction with clobazam. - Advise not to use the Xanax with the lorazepam. - Provided brief grief counseling.  Long-term use of cannabis-rule out cannabis use disorder-unstable - We will reevaluate in future session. - Pending urine drug  screen.  Cognitive disorder-possible Lewy body dementia-unstable Patient with significant cognitive issues. - Continue follow-up with [facility] neurology. - Continue Donepezil 10 mg at bedtime  Collateral information obtained from [place], niece who was present in session today.   Pending labs-TSH, urine drug screen, sodium, prolactin level.     Collaboration of Care: Collaboration of Care: Referral or follow-up with counselor/therapist AEB I have communicated with staff to schedule this patient with therapist.  Also provided information for grief counselor in the community.  Patient/Guardian was advised Release of Information must be obtained prior to any record release in order to collaborate their care with an outside provider. Patient/Guardian was advised if they have not already done so to contact the registration department to sign all necessary forms in order for [place] to release information regarding their care.   Consent: Patient/Guardian gives verbal consent for treatment and assignment of benefits for services provided during this visit. Patient/Guardian expressed understanding and agreed to proceed.  This note was generated in part or whole with voice recognition software. Voice recognition is usually quite accurate but there are transcription errors that can and very often do occur. I apologize for any typographical errors that were not detected and corrected.     Jomarie Longs, MD 04/24/2023, 11:28 AM

## 2023-04-29 ENCOUNTER — Telehealth: Payer: Self-pay | Admitting: Podiatry

## 2023-04-29 NOTE — Telephone Encounter (Signed)
 Pts niece called and asking if you would order a mri for pt. PT is still having a lot of pain and can hardly walk. The family is having to push pt around in a wheelchair at times.They are the ones having to deal with pt being in pain.   I did explain that Dr Allena Katz is out of the office today ( his surgery day) and they may not hear back until tomorrow.

## 2023-04-30 NOTE — Telephone Encounter (Signed)
 Called pts niece and she said she is just going to take pt back to the doctor that did the surgery because this pain has been going on for a long time. She is sick of pt only getting x rays.

## 2023-05-07 ENCOUNTER — Telehealth: Payer: Self-pay

## 2023-05-07 DIAGNOSIS — F250 Schizoaffective disorder, bipolar type: Secondary | ICD-10-CM

## 2023-05-07 NOTE — Telephone Encounter (Signed)
 pt niece called she states that Sandra Charles is very anxious and nervous, and agitated. she can not remember things and you have to keep repeating yourself to her. she saying things like "you getting on my nerves"... she states they took her yesterday to see her primary about a rash and she had to be told several times about what was going on and she was blaming others around her.    She knows that dr. Elna Breslow is out today but she wanted to see if dr. Elna Breslow would call her or send something to the pharmacy that could help.    Pt was last seen on 2-26 next appt 4-30

## 2023-05-08 NOTE — Telephone Encounter (Signed)
Attempted to contact patient, unable to leave a voicemail.

## 2023-05-14 NOTE — Telephone Encounter (Signed)
 Spoke to Bradley, discussed mood stabilizer option, addition of Depakote. Also discussed initiating therapy, provided resources in the community.  Niece agrees to be present with patient tomorrow at around 5 or 5:15 PM to discuss medication options in detail the patient.  Once that is done we will consider starting this patient on Depakote.  In the meantime niece agrees to find a therapist for the patient.Marland Kitchen

## 2023-05-14 NOTE — Telephone Encounter (Signed)
 barbara left a message that she turned in the release so that dr. Elna Breslow can speak with her. and she has not heard anything back as of yet. she wanted dr. Elna Breslow to call her about Sandra Charles.   Front desk states that roi for both Sandra Charles and Sandra Charles is in the system.

## 2023-05-15 ENCOUNTER — Telehealth: Payer: Self-pay | Admitting: Psychiatry

## 2023-05-15 DIAGNOSIS — F250 Schizoaffective disorder, bipolar type: Secondary | ICD-10-CM

## 2023-05-15 MED ORDER — GABAPENTIN 100 MG PO CAPS: 100.0000 mg | ORAL_CAPSULE | Freq: Three times a day (TID) | ORAL | 1 refills | Status: DC

## 2023-05-15 NOTE — Telephone Encounter (Signed)
 Contacted patient as well as niece Britta Mccreedy, patient with recent anxiety and mood lability, will go ahead and start gabapentin 100 mg 3 times a day.  I have sent the medication to pharmacy.  Encouraged to establish care with a therapist.

## 2023-06-26 ENCOUNTER — Ambulatory Visit: Payer: 59 | Admitting: Psychiatry

## 2023-06-26 ENCOUNTER — Encounter: Payer: Self-pay | Admitting: Psychiatry

## 2023-06-26 VITALS — BP 122/76 | HR 65 | Temp 97.8°F | Ht 61.0 in | Wt 173.6 lb

## 2023-06-26 DIAGNOSIS — F1290 Cannabis use, unspecified, uncomplicated: Secondary | ICD-10-CM | POA: Diagnosis not present

## 2023-06-26 DIAGNOSIS — F09 Unspecified mental disorder due to known physiological condition: Secondary | ICD-10-CM | POA: Diagnosis not present

## 2023-06-26 DIAGNOSIS — F250 Schizoaffective disorder, bipolar type: Secondary | ICD-10-CM | POA: Diagnosis not present

## 2023-06-26 DIAGNOSIS — Z634 Disappearance and death of family member: Secondary | ICD-10-CM

## 2023-06-26 MED ORDER — GABAPENTIN 300 MG PO CAPS: 300.0000 mg | ORAL_CAPSULE | Freq: Two times a day (BID) | ORAL | 1 refills | Status: DC

## 2023-06-26 MED ORDER — SERTRALINE HCL 100 MG PO TABS: 150.0000 mg | ORAL_TABLET | Freq: Every day | ORAL | 1 refills | Status: DC

## 2023-06-26 NOTE — Progress Notes (Signed)
 BH MD OP Progress Note  06/26/2023 1:28 PM Sandra Charles  MRN:  [id number]  Chief Complaint:  Chief Complaint  Patient presents with   Follow-up   Depression   Anxiety   Grief   Medication Refill    Discussed the use of AI scribe software for clinical note transcription with the patient, who gave verbal consent to proceed.  History of Present Illness Sandra Charles is a 68 year old African-American female, currently lives in [place], on [place], has a history of schizoaffective disorder, probable Lewy body dementia, hypertension, dyslipidemia, seizure disorder, prediabetes, gastroesophageal reflux disease was evaluated in office today presented for medication management.  She experiences significant memory issues, including difficulty recalling recent events, dates. Short-term memory tasks, such as remembering sequences of numbers or words, are challenging. Her caregivers note frequent forgetfulness, including incidents like flooding the bathroom due to memory lapses. There was a recent issue with obtaining her medication, Donepezil , which was resolved by her primary care provider.  She has a history of seizures but no recent seizure activity. Her caregivers ensure she is not left alone for extended periods due to her seizure history.  She is currently taking gabapentin , which was recently added to her medication regimen for management of mood symptoms.  She also takes sertraline  150 mg and Seroquel  50 mg. Her caregivers report that she is more active since starting gabapentin . They have installed cameras for her safety and ensure she is supervised most of the time.  She has upcoming appointment with [facility] to start a day program.  Patient currently denies any suicidality, homicidality.  She appeared to be pleasant, cooperative in session today.  Patient reports grief as overall improving and she is coping better with that.  She is motivated to stay in psychotherapy.  She reports  ongoing issues with her ankle following surgery for a fracture that occurred approximately a year ago. Walking is painful, especially when pressure is applied. She attempts to walk with the aid of a cane or walker.    Visit Diagnosis: Rule out functional neurological symptoms disorder   ICD-10-CM   1. Schizoaffective disorder, bipolar type (HCC)  F25.0 gabapentin  (NEURONTIN ) 300 MG capsule    sertraline  (ZOLOFT ) 100 MG tablet    2. Bereavement  Z63.4 sertraline  (ZOLOFT ) 100 MG tablet    3. Long term current use of cannabis  F12.90     4. Cognitive disorder  F09    Probable Lewy body dementia      Past Psychiatric History: I have reviewed past psychiatric history from progress note on 01/10/2023.  Patient with multiple inpatient behavioral health admission most recent 1 in 2023 at [facility].  Patient denies suicide attempts.  Past medication trials include Celexa , Xanax , Depakote , Lamictal .  Past Medical History:  Past Medical History:  Diagnosis Date   Anxiety    Arthritis    "all over"   Chronic lower back pain    Dementia (HCC)    Depression    GERD (gastroesophageal reflux disease)    Headache    "weekly" (07/15/2015)   Hyperlipidemia    Hypertension    Lewy body dementia (HCC)    Mini stroke    "several since 05/2014" (07/15/2015)   Psychosis in elderly, without behavioral disturbance (HCC)    Seizures (HCC) dx'd 04/2015    Past Surgical History:  Procedure Laterality Date   COLONOSCOPY WITH PROPOFOL  N/A 11/20/2021   Procedure: COLONOSCOPY WITH PROPOFOL ;  Surgeon: Shane Darling, MD;  Location:  [facility] ENDOSCOPY;  Service: Endoscopy;  Laterality: N/A;   DILATION AND CURETTAGE OF UTERUS     ESOPHAGOGASTRODUODENOSCOPY (EGD) WITH PROPOFOL  N/A 11/20/2021   Procedure: ESOPHAGOGASTRODUODENOSCOPY (EGD) WITH PROPOFOL ;  Surgeon: Shane Darling, MD;  Location: [facility] ENDOSCOPY;  Service: Endoscopy;  Laterality: N/A;   FRACTURE SURGERY     LUMBAR PUNCTURE  07/14/2015   ORIF ANKLE  FRACTURE Right 01/20/2021   Procedure: OPEN REDUCTION INTERNAL FIXATION (ORIF) ANKLE FRACTURE;  Surgeon: Elner Hahn, MD;  Location: [facility] ORS;  Service: Orthopedics;  Laterality: Right;   TUBAL LIGATION     VAGINAL HYSTERECTOMY      Family Psychiatric History: I have reviewed family psychiatric history from progress note on 01/10/2023.  Family History:  Family History  Problem Relation Age of Onset   Hyperlipidemia Mother    Breast cancer Mother 23   Hypertension Mother    Heart attack Mother        age 91's   Hyperlipidemia Father    Hypertension Father    Heart attack Father 62   Breast cancer Sister 76   Mental illness No known conditions     Social History: I have reviewed social history from progress note on 01/10/2023. Social History   Socioeconomic History   Marital status: Single    Spouse name: Not on file   Number of children: 1   Years of education: Not on file   Highest education level: Not on file  Occupational History   Occupation: disabled  Tobacco Use   Smoking status: Former    Current packs/day: 1.00    Average packs/day: 1 pack/day for 5.0 years (5.0 ttl pk-yrs)    Types: Cigarettes   Smokeless tobacco: Never   Tobacco comments:    "quit smoking cigarettes in the early 2000s"  Vaping Use   Vaping status: Never Used  Substance and Sexual Activity   Alcohol use: Not Currently    Comment: 07/15/2015 "nothing in the last few years"   Drug use: No   Sexual activity: Not Currently  Other Topics Concern   Not on file  Social History Narrative   Lives with significant other, has son who comes by daily.   Social Drivers of Health   Financial Resource Strain: Medium Risk (06/27/2023)   Overall Financial Resource Strain (CARDIA)    Difficulty of Paying Living Expenses: Somewhat hard  Food Insecurity: No Food Insecurity (06/27/2023)   Hunger Vital Sign    Worried About Running Out of Food in the Last Year: Never true    Ran Out of Food in the Last  Year: Never true  Transportation Needs: No Transportation Needs (06/27/2023)   PRAPARE - Administrator, Civil Service (Medical): No    Lack of Transportation (Non-Medical): No  Physical Activity: Inactive (06/27/2023)   Exercise Vital Sign    Days of Exercise per Week: 0 days    Minutes of Exercise per Session: 0 min  Stress: No Stress Concern Present (06/27/2023)   [employer] of Occupational Health - Occupational Stress Questionnaire    Feeling of Stress : Not at all  Social Connections: Moderately Isolated (06/27/2023)   Social Connection and Isolation Panel [NHANES]    Frequency of Communication with Friends and Family: More than three times a week    Frequency of Social Gatherings with Friends and Family: More than three times a week    Attends Religious Services: More than 4 times per year    Active Member of [employer]  or Organizations: No    Attends Banker Meetings: Never    Marital Status: Widowed    Allergies:  Allergies  Allergen Reactions   Lovastatin  Other (See Comments)    Unknown reaction    Metabolic Disorder Labs: Lab Results  Component Value Date   HGBA1C 5.6 07/05/2021   MPG 114.02 07/05/2021   Lab Results  Component Value Date   PROLACTIN 9.4 08/10/2015   PROLACTIN 7.1 07/12/2015   Lab Results  Component Value Date   CHOL 190 07/05/2021   TRIG 100 07/05/2021   HDL 49 07/05/2021   CHOLHDL 3.9 07/05/2021   VLDL 20 07/05/2021   LDLCALC 121 (H) 07/05/2021   LDLCALC 48 08/10/2015   Lab Results  Component Value Date   TSH 0.934 08/27/2020   TSH 6.026 (H) 03/27/2018    Therapeutic Level Labs: No results found for: "LITHIUM" Lab Results  Component Value Date   VALPROATE <10 (L) 01/29/2022   VALPROATE <10 (L) 01/29/2022   No results found for: "CBMZ"  Current Medications: Current Outpatient Medications  Medication Sig Dispense Refill   amLODipine  (NORVASC ) 10 MG tablet Take 10 mg by mouth daily. For high blood pressure      celecoxib (CELEBREX) 200 MG capsule Take 200 mg by mouth.     donepezil  (ARICEPT ) 10 MG tablet Take 10 mg by mouth at bedtime.     gabapentin  (NEURONTIN ) 300 MG capsule Take 1 capsule (300 mg total) by mouth 2 (two) times daily. 60 capsule 1   hydrochlorothiazide  (HYDRODIURIL ) 12.5 MG tablet Take 12.5 mg by mouth.     lamoTRIgine  (LAMICTAL ) 100 MG tablet Take 1 tablet (100 mg total) by mouth 2 (two) times daily. Along with 25 mg tablet for total of 125 mg bid. 60 tablet 0   lamoTRIgine  (LAMICTAL ) 25 MG tablet Take 1 tablet (25 mg total) by mouth 2 (two) times daily. Along with 100 mg tablet for total of 125 mg bid. 60 tablet 0   losartan (COZAAR) 25 MG tablet Take 25 mg by mouth daily.     oxyCODONE -acetaminophen  (PERCOCET) 5-325 MG tablet Take 1 tablet by mouth every 4 (four) hours as needed for severe pain (pain score 7-10). 30 tablet 0   pantoprazole  (PROTONIX ) 40 MG tablet Take 1 tablet (40 mg total) by mouth daily. 30 tablet 11   propranolol  ER (INDERAL  LA) 60 MG 24 hr capsule Take 60 mg by mouth daily.  11   QUEtiapine  (SEROQUEL ) 50 MG tablet Take 1 tablet (50 mg total) by mouth at bedtime. 90 tablet 1   rosuvastatin  (CRESTOR ) 20 MG tablet Take 20 mg by mouth daily.     Semaglutide ,0.25 or 0.5MG /DOS, 2 MG/3ML SOPN Inject into the skin.     valACYclovir (VALTREX) 1000 MG tablet Take 1,000 mg by mouth.     cloBAZam  (ONFI ) 10 MG tablet Take 0.5 tablets (5 mg total) by mouth at bedtime. 15 tablet 0   sertraline  (ZOLOFT ) 100 MG tablet Take 1.5 tablets (150 mg total) by mouth daily. 45 tablet 1   No current facility-administered medications for this visit.     Musculoskeletal: Strength & Muscle Tone: within normal limits Gait & Station: unsteady Patient leans: N/A  Psychiatric Specialty Exam: Review of Systems  Psychiatric/Behavioral:         Grief    Blood pressure 122/76, pulse 65, temperature 97.8 F (36.6 C), temperature source Temporal, height 5\' 1"  (1.549 m), weight 173 lb  9.6 oz (78.7 kg), SpO2 94%.Body mass  index is 32.8 kg/m.  General Appearance: Fairly Groomed  Eye Contact:  Fair  Speech:  Normal Rate  Volume:  Normal  Mood:   grief  Affect:  Congruent  Thought Process:  Goal Directed and Descriptions of Associations: Intact  Orientation:  Other:  self , situation  Thought Content: Logical   Suicidal Thoughts:  No  Homicidal Thoughts:  No  Memory:  Immediate;   Fair Recent;   Poor Remote;   Poor  Judgement:  Fair  Insight:  Fair  Psychomotor Activity:  Normal  Concentration:  Concentration: Fair and Attention Span: Fair  Recall:  Poor  Fund of Knowledge: Poor  Language: Fair  Akathisia:  No  Handed:  Left  AIMS (if indicated): not done  Assets:  Desire for Improvement Housing Social Support Transportation  ADL's:  Intact  Cognition: Limited  Sleep:  Fair   Screenings: AIMS    Flowsheet Row Admission (Discharged) from 08/10/2015 in [facility] BE[facility]INE  AIMS Total Score 0      AUDIT    Flowsheet Row Admission (Discharged) from 07/02/2021 in [facility] Admission (Discharged) from 08/10/2015 in [facility] INPATIENT BEHAV[facility] MEDICINE  Alcohol Use Disorder Identification Test Final Score (AUDIT) 0 0      GAD-7    Flowsheet Row Office Visit from 06/26/2023 in [facility] Office Visit from 01/10/2023 in [facility]  Total GAD-7 Score 3 3      PHQ2-9    Flowsheet Row Office Visit from 06/26/2023 in [facility] Office Visit from 01/10/2023 in [facility] Office Visit from 12/14/2014 in [facility] - A Dept Of [place] Office Visit from 11/09/2014 in [facility] - A Dept Of [facility] Fragmin. [facility] Office Visit from 10/18/2014 in [facility] - A Dept Of [facility]. [facility]  PHQ-2 Total Score 1 1 0 0 0  PHQ-9 Total Score 4 3 -- -- --      Flowsheet Row Office Visit from 06/26/2023 in [facility] Office Visit from 04/24/2023 in [facility] ED from 01/20/2023 in [facility] Emergency Department at [facility]  C-SSRS RISK CATEGORY No Risk No Risk No Risk        Assessment and Plan: Sandra Charles is a 68 year old African-American female with history of schizoaffective disorder, seizure disorder, possible Lewy body dementia, bereavement, presented for a follow-up appointment, discussed assessment and plan as noted below.  Assessment & Plan  Schizoaffective disorder bipolar type-unstable Continues to have mood symptoms exacerbated by recent bereavement although with some improvement.  Patient is scheduled to start [facility] program 3 days a week. - Continue Seroquel  extended release 50 mg at bedtime - Continue Sertraline  150 mg daily - Increase Gabapentin  to 300 mg twice a day. - Patient is also on Lamictal  although prescribed for seizures which is also a mood stabilizer.  Cognitive impairment-possible Lewy body dementia-unstable Cognitive impairment with significant memory deficits, evidenced by failure in memory tests and difficulty recalling recent events and current information. She is unable to remember the current year or the president. Supervision is necessary due to memory issues, as demonstrated by incidents like flooding the bathroom. She is planning to start a day program three times a week for structure and socialization. Gabapentin  appears to increase her  activity level rather than causing sedation. - Increase Gabapentin  to 300 mg twice daily, morning and afternoon. - Continue Donepezil  10 mg at bedtime - Start attending the day program three times a week. - Follow up with the neurologist at [facility] regarding memory changes - Ensure supervision at  home due to memory issues.  Bereavement-improving Continues to experience grief, recent death of her partner of 41 years. - Patient was referred for psychotherapy.  Long-term use of cannabis-rule out cannabis use disorder-unstable - Pending urine drug screen.  Collateral information obtained from niece and caretaker Ms. Felipa Horsfall who was present in session today.  Follow-up Follow-up in clinic in 4 to 5 weeks or sooner if needed.  Collaboration of Care: Collaboration of Care: Referral or follow-up with counselor/therapist AEB patient was referred for psychotherapy previously-pending  Patient/Guardian was advised Release of Information must be obtained prior to any record release in order to collaborate their care with an outside provider. Patient/Guardian was advised if they have not already done so to contact the registration department to sign all necessary forms in order for us  to release information regarding their care.   Consent: Patient/Guardian gives verbal consent for treatment and assignment of benefits for services provided during this visit. Patient/Guardian expressed understanding and agreed to proceed.  This note was generated in part or whole with voice recognition software. Voice recognition is usually quite accurate but there are transcription errors that can and very often do occur. I apologize for any typographical errors that were not detected and corrected.     Alianny Toelle, MD 06/28/2023, 8:01 AM

## 2023-06-27 ENCOUNTER — Ambulatory Visit (INDEPENDENT_AMBULATORY_CARE_PROVIDER_SITE_OTHER): Payer: Medicare (Managed Care) | Admitting: Professional Counselor

## 2023-06-27 DIAGNOSIS — F250 Schizoaffective disorder, bipolar type: Secondary | ICD-10-CM
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
## 2023-07-16 ENCOUNTER — Ambulatory Visit (INDEPENDENT_AMBULATORY_CARE_PROVIDER_SITE_OTHER): Admitting: Professional Counselor

## 2023-07-16 DIAGNOSIS — F250 Schizoaffective disorder, bipolar type: Secondary | ICD-10-CM

## 2023-07-16 NOTE — Progress Notes (Signed)
  THERAPIST PROGRESS NOTE  Session Time: 9:05 AM - 9:50 AM  Participation Level: Active  Behavioral Response: Casual, Alert, Dysphoric  Type of Therapy: Individual Therapy  Treatment Goals addressed: Active BH CCP SCHIZOAFFECTIVE - BIPOLAR TYPE   LTG: "Stand by myself, because someone's always with me because they're scared I'm going to leave a burner on or something. I'd like to remember things better than I do."    Start:  07/16/23    Expected End:  07/14/24     STG: "Be independent. I would like to walk better, to walk on my own." To improve adjustment to health issues AEB restructuring unhealthy thinking patterns and working towards acceptance over the next 90 days.    STG: "Sometimes I have anxiety." To reduce sxs of anxiety AEB reduction in GAD7 screenings but using coping mechanisms 3 out of 7 days a week for 12 weeks.   STG: "I told them you have to call me in the morning, don't tell me the night before and expect me to remember the next day." To improve memory AEB increased use of coping mechanisms to support memory functioning over the next 90 days.    ProgressTowards Goals: Initial  Interventions: CBT, Motivational Interviewing, and Supportive  Summary: Sandra Charles is a 68 y.o. female who presents with schizoaffective disorder - bipolar type. She appeared alert and oriented x5. She stated things are going okay. She is attending the day program at [facility] on Mondays, Wednesdays, and Fridays. She engaged in developing her treatment plan. She was receptive to focusing on things within her control. [name] expressed her frustration with being treated "like a child." She was somewhat receptive to restructuring this thought as being treated like a loved one. She was able to identify ways family has tried to make accommodations to allow for her independence. She also noted things they don't want her to do on her own. [name] agreed getting her mobility back after breaking her leg may allow her  to improve some of her independence.   Therapist Response: Conducted session with [name]. Began session with check-in/update since previous session. Utilized empathetic and reflective listening. Developed treatment plan with input from [place] on current strengths, needs, and progress towards goals. Assisted with restructuring thoughts of being treated like a child to being treated like a loved one. Explored ways family has made accommodations to support her independence. Scheduled additional appointment and concluded session.   Suicidal/Homicidal: No  Plan: Return again in 2 weeks.  Diagnosis: Schizoaffective disorder, bipolar type (HCC)  Collaboration of Care: Medication Management AEB chart review  Patient/Guardian was advised Release of Information must be obtained prior to any record release in order to collaborate their care with an outside provider. Patient/Guardian was advised if they have not already done so to contact the registration department to sign all necessary forms in order for us  to release information regarding their care.   Consent: Patient/Guardian gives verbal consent for treatment and assignment of benefits for services provided during this visit. Patient/Guardian expressed understanding and agreed to proceed.   Len Quale, [facility] 07/16/2023

## 2023-08-01 ENCOUNTER — Ambulatory Visit: Admitting: Professional Counselor

## 2023-08-15 ENCOUNTER — Ambulatory Visit (INDEPENDENT_AMBULATORY_CARE_PROVIDER_SITE_OTHER): Payer: Medicare (Managed Care) | Admitting: Professional Counselor

## 2023-08-15 DIAGNOSIS — Z634 Disappearance and death of family member: Secondary | ICD-10-CM

## 2023-08-15 DIAGNOSIS — F250 Schizoaffective disorder, bipolar type: Secondary | ICD-10-CM

## 2023-08-15 NOTE — Progress Notes (Signed)
  THERAPIST PROGRESS NOTE  Session Time: 10:00 AM - 10:50 AM   Participation Level: Active  Behavioral Response: Casual, Alert, Euthymic  Type of Therapy: Individual Therapy  Treatment Goals addressed: Active BH CCP SCHIZOAFFECTIVE - BIPOLAR TYPE    LTG: Stand by myself, because someone's always with me because they're scared I'm going to leave a burner on or something. I'd like to remember things better than I do.                Start:  07/16/23    Expected End:  07/14/24      STG: Be independent. I would like to walk better, to walk on my own. To improve adjustment to health issues AEB restructuring unhealthy thinking patterns and working towards acceptance over the next 90 days.     STG: Sometimes I have anxiety. To reduce sxs of anxiety AEB reduction in GAD7 screenings but using coping mechanisms 3 out of 7 days a week for 12 weeks.    STG: I told them you have to call me in the morning, don't tell me the night before and expect me to remember the next day. To improve memory AEB increased use of coping mechanisms to support memory functioning over the next 90 days.    ProgressTowards Goals: Progressing  Interventions: Motivational Interviewing and Supportive  Summary: Sandra Charles is a 68 y.o. female who presents with schizoaffective disorder - bipolar type. She appeared alert and oriented x5. She was using her cane today and discussed her struggle with following doctor's and family orders around this. [name] continues to struggle with accepting her physical/health changes as she gets older. She noted how she enjoys cleaning and doesn't want to stop doing this and other things simply because her ability has changed. She was receptive to being mindful and doing things in a safe way. [name] briefly discussed her grief around losing her life partner a few years ago.   Therapist Response: Conducted session with [name]. Began session with check-in/update since previous session. Utilized  empathetic and reflective listening. Used open-ended questions to facilitate discussion and summarized [name]'s thoughts/feelings. Explored concept of acceptance and change. Briefly discussed feelings of grief around loss of life partner. Scheduled additional appointment and concluded session.   Suicidal/Homicidal: No  Plan: Return again in 2 weeks.  Diagnosis: Schizoaffective disorder, bipolar type (HCC)  Bereavement  Collaboration of Care: Medication Management AEB chart review  Patient/Guardian was advised Release of Information must be obtained prior to any record release in order to collaborate their care with an outside provider. Patient/Guardian was advised if they have not already done so to contact the registration department to sign all necessary forms in order for us  to release information regarding their care.   Consent: Patient/Guardian gives verbal consent for treatment and assignment of benefits for services provided during this visit. Patient/Guardian expressed understanding and agreed to proceed.   Len Quale, [facility] 08/15/2023

## 2023-08-27 ENCOUNTER — Ambulatory Visit (INDEPENDENT_AMBULATORY_CARE_PROVIDER_SITE_OTHER): Admitting: Professional Counselor

## 2023-08-27 DIAGNOSIS — F250 Schizoaffective disorder, bipolar type: Secondary | ICD-10-CM | POA: Diagnosis not present

## 2023-08-27 DIAGNOSIS — Z634 Disappearance and death of family member: Secondary | ICD-10-CM

## 2023-08-27 NOTE — Progress Notes (Signed)
  THERAPIST PROGRESS NOTE  Session Time: 3:00 PM - 3:50 PM   Participation Level: Active  Behavioral Response: Casual, Alert, Dysphoric  Type of Therapy: Individual Therapy  Treatment Goals addressed:  Active BH CCP SCHIZOAFFECTIVE - BIPOLAR TYPE    LTG: Stand by myself, because someone's always with me because they're scared I'm going to leave a burner on or something. I'd like to remember things better than I do.                Start:  07/16/23    Expected End:  07/14/24      STG: Be independent. I would like to walk better, to walk on my own. To improve adjustment to health issues AEB restructuring unhealthy thinking patterns and working towards acceptance over the next 90 days.     STG: Sometimes I have anxiety. To reduce sxs of anxiety AEB reduction in GAD7 screenings but using coping mechanisms 3 out of 7 days a week for 12 weeks.    STG: I told them you have to call me in the morning, don't tell me the night before and expect me to remember the next day. To improve memory AEB increased use of coping mechanisms to support memory functioning over the next 90 days.   ProgressTowards Goals: Progressing  Interventions: Motivational Interviewing and Supportive and Assertiveness Training  Summary: Sandra Charles is a 68 y.o. female who presents with schizoaffective disorder and bereavement. She appeared alert and oriented x5. She expressed ongoing frustration about her lack of independence. She noted ways she tries to speak up for herself but it's harder with some family members than others. She processed her feelings of grief over the loss of her life partner. She shared that she likes to talk to his pictures to help process those feelings as well. She noted her son is a good form of support but she worries about his grief.   Therapist Response: Conducted session with Sandra Charles. Began session with check-in/update since previous session. Utilized empathetic and reflective listening.  Processed Sandra Charles's frustrations about lack of independence and modeled assertiveness skill to assist with speaking up for herself. Brainstormed potential solutions to help family feel secure and provide for [facility] safety. Processed Sandra Charles's grief about her partner's death and normalized different grief experiences. Confirmed next appointment and concluded session.   Suicidal/Homicidal: No  Plan: Return again in 4 weeks.  Diagnosis: Schizoaffective disorder, bipolar type (HCC)  Bereavement  Collaboration of Care: Medication Management AEB chart review  Patient/Guardian was advised Release of Information must be obtained prior to any record release in order to collaborate their care with an outside provider. Patient/Guardian was advised if they have not already done so to contact the registration department to sign all necessary forms in order for us  to release information regarding their care.   Consent: Patient/Guardian gives verbal consent for treatment and assignment of benefits for services provided during this visit. Patient/Guardian expressed understanding and agreed to proceed.   [name], [facility] 08/27/2023

## 2023-08-28 ENCOUNTER — Ambulatory Visit: Admitting: Psychiatry

## 2023-09-19 ENCOUNTER — Encounter: Payer: Self-pay | Admitting: Psychiatry

## 2023-09-19 ENCOUNTER — Ambulatory Visit (INDEPENDENT_AMBULATORY_CARE_PROVIDER_SITE_OTHER): Admitting: Psychiatry

## 2023-09-19 VITALS — BP 124/80 | HR 62 | Temp 98.3°F | Ht 61.0 in | Wt 177.0 lb

## 2023-09-19 DIAGNOSIS — F09 Unspecified mental disorder due to known physiological condition: Secondary | ICD-10-CM

## 2023-09-19 DIAGNOSIS — F1290 Cannabis use, unspecified, uncomplicated: Secondary | ICD-10-CM

## 2023-09-19 DIAGNOSIS — Z634 Disappearance and death of family member: Secondary | ICD-10-CM | POA: Diagnosis not present

## 2023-09-19 DIAGNOSIS — F250 Schizoaffective disorder, bipolar type: Secondary | ICD-10-CM

## 2023-09-19 MED ORDER — SERTRALINE HCL 100 MG PO TABS: 150.0000 mg | ORAL_TABLET | Freq: Every day | ORAL | 1 refills | Status: DC

## 2023-09-19 MED ORDER — GABAPENTIN 300 MG PO CAPS: 300.0000 mg | ORAL_CAPSULE | Freq: Two times a day (BID) | ORAL | 1 refills | Status: DC

## 2023-09-19 MED ORDER — QUETIAPINE FUMARATE 50 MG PO TABS: 50.0000 mg | ORAL_TABLET | Freq: Every day | ORAL | 1 refills | Status: DC

## 2023-09-19 NOTE — Progress Notes (Unsigned)
 BH MD  OP Progress Note  09/19/2023 10:32 AM Sandra Charles  MRN:  [id number]  Chief Complaint:  Chief Complaint  Patient presents with   Follow-up   Depression   Anxiety   grief   Medication Refill   Discussed the use of AI scribe software for clinical note transcription with the patient, who gave verbal consent to proceed.  History of Present Illness Sandra Charles is a 68 year old African American female, currently lives in [place], [place], has a history of schizoaffective disorder, probably Lewy body dementia, hypertension, dyslipidemia, seizure disorder, prediabetes, gastroesophageal reflux disease was evaluated in office today for a follow-up appointment.  She reports she is currently okay.  She uses a walker at home but often holds onto walls for support. She attends a day program in [place], [place] which she reports is helpful and includes various activities. She does not keep track of dates but is aware of the day of the week.  She continues to have memory problems and is a limited historian.  Hence collateral information was obtained from her niece Sandra Charles.  Her sleep pattern includes waking up around 4:30 AM twice a week, talking to herself for about an hour and a half. She does not sleep during the day but rests when she returns from the [facility] program. She feels rested on some days but not consistently.  Patient denies any thoughts of self-harm or harm to others. She states that her appetite is okay. She does not report any new concerns or changes since the last visit.  As per Kathlene, she notes that she continues to have trouble with her memory as well as continues to need support.  She does have difficulty sleeping at least some nights usually to out of 7 nights a week.  Her sleep is interrupted those nights although she is able to fall back asleep in an hour or 2.  She does not believe she needs any medication adjustments at this time for the same and will let this provider  know if sleep problems gets worse.  Going to pace has been helpful since she cannot afford full-time caregivers at home.     Visit Diagnosis:    ICD-10-CM   1. Schizoaffective disorder, bipolar type (HCC)  F25.0 sertraline  (ZOLOFT ) 100 MG tablet    QUEtiapine  (SEROQUEL ) 50 MG tablet    gabapentin  (NEURONTIN ) 300 MG capsule    2. Bereavement  Z63.4 sertraline  (ZOLOFT ) 100 MG tablet    3. Long term current use of cannabis  F12.90     4. Cognitive disorder  F09    Probable Lewy body dementia      Past Psychiatric History: I have reviewed past psychiatric history from progress note on 01/10/2023.  Patient with multiple inpatient behavioral health admissions most recent 1 in 2023 at [facility].  Denies suicide attempts.  Past medication trials include Celexa , Xanax , Depakote , Lamictal .  Past Medical History:  Past Medical History:  Diagnosis Date   Anxiety    Arthritis    all over   Chronic lower back pain    Dementia (HCC)    Depression    GERD (gastroesophageal reflux disease)    Headache    weekly (07/15/2015)   Hyperlipidemia    Hypertension    Lewy body dementia (HCC)    Mini stroke    several since 05/2014 (07/15/2015)   Psychosis in elderly, without behavioral disturbance (HCC)    Seizures (HCC) dx'd 04/2015    Past  Surgical History:  Procedure Laterality Date   COLONOSCOPY WITH PROPOFOL  N/A 11/20/2021   Procedure: COLONOSCOPY WITH PROPOFOL ;  Surgeon: Maryruth Ole DASEN, MD;  Location: [facility];  Service: Endoscopy;  Laterality: N/A;   DILATION AND CURETTAGE OF UTERUS     ESOPHAGOGASTRODUODENOSCOPY (EGD) WITH PROPOFOL  N/A 11/20/2021   Procedure: ESOPHAGOGASTRODUODENOSCOPY (EGD) WITH PROPOFOL ;  Surgeon: Maryruth Ole DASEN, MD;  Location: [facility];  Service: Endoscopy;  Laterality: N/A;   FRACTURE SURGERY     LUMBAR PUNCTURE  07/14/2015   ORIF ANKLE FRACTURE Right 01/20/2021   Procedure: OPEN REDUCTION INTERNAL FIXATION (ORIF) ANKLE FRACTURE;  Surgeon:  Edie Norleen PARAS, MD;  Location: [facility] ORS;  Service: Orthopedics;  Laterality: Right;   TUBAL LIGATION     VAGINAL HYSTERECTOMY      Family Psychiatric History: I have reviewed family psychiatric history from progress note on 01/10/2023.  Family History:  Family History  Problem Relation Age of Onset   Hyperlipidemia Mother    Breast cancer Mother 38   Hypertension Mother    Heart attack Mother        age 35's   Hyperlipidemia Father    Hypertension Father    Heart attack Father 38   Breast cancer Sister 35   Mental illness No known conditions     Social History: I have reviewed social history from progress note on 01/10/2023. Social History   Socioeconomic History   Marital status: Single    Spouse name: Not on file   Number of children: 1   Years of education: Not on file   Highest education level: Not on file  Occupational History   Occupation: disabled  Tobacco Use   Smoking status: Former    Current packs/day: 1.00    Average packs/day: 1 pack/day for 5.0 years (5.0 ttl pk-yrs)    Types: Cigarettes   Smokeless tobacco: Never   Tobacco comments:    quit smoking cigarettes in the early 2000s  Vaping Use   Vaping status: Never Used  Substance and Sexual Activity   Alcohol use: Not Currently    Comment: 07/15/2015 nothing in the last few years   Drug use: No   Sexual activity: Not Currently  Other Topics Concern   Not on file  Social History Narrative   Lives with significant other, has son who comes by daily.   Social Drivers of Corporate investment banker Strain: Low Risk  (07/29/2023)   Received from [facility]   Overall Financial Resource Strain (CARDIA)    Difficulty of Paying Living Expenses: Not hard at all  Recent Concern: Financial Resource Strain - Medium Risk (06/27/2023)   Overall Financial Resource Strain (CARDIA)    Difficulty of Paying Living Expenses: Somewhat hard  Food Insecurity: No Food Insecurity (07/29/2023)   Received  from [facility]   Hunger Vital Sign    Within the past 12 months, you worried that your food would run out before you got the money to buy more.: Never true    Within the past 12 months, the food you bought just didn't last and you didn't have money to get more.: Never true  Transportation Needs: No Transportation Needs (07/29/2023)   Received from [facility] - Transportation    In the past 12 months, has lack of transportation kept you from medical appointments or from getting medications?: No    Lack of Transportation (Non-Medical): No  Physical Activity: Inactive (06/27/2023)  Exercise Vital Sign    Days of Exercise per Week: 0 days    Minutes of Exercise per Session: 0 min  Stress: No Stress Concern Present (06/27/2023)   [employer] of Occupational Health - Occupational Stress Questionnaire    Feeling of Stress : Not at all  Social Connections: Moderately Isolated (06/27/2023)   Social Connection and Isolation Panel    Frequency of Communication with Friends and Family: More than three times a week    Frequency of Social Gatherings with Friends and Family: More than three times a week    Attends Religious Services: More than 4 times per year    Active Member of [employer] or Organizations: No    Attends Banker Meetings: Never    Marital Status: Widowed    Allergies:  Allergies  Allergen Reactions   Lovastatin  Other (See Comments)    Unknown reaction    Metabolic Disorder Labs: Lab Results  Component Value Date   HGBA1C 5.6 07/05/2021   MPG 114.02 07/05/2021   Lab Results  Component Value Date   PROLACTIN 9.4 08/10/2015   PROLACTIN 7.1 07/12/2015   Lab Results  Component Value Date   CHOL 190 07/05/2021   TRIG 100 07/05/2021   HDL 49 07/05/2021   CHOLHDL 3.9 07/05/2021   VLDL 20 07/05/2021   LDLCALC 121 (H) 07/05/2021   LDLCALC 48 08/10/2015   Lab Results  Component Value Date   TSH 0.934 08/27/2020    TSH 6.026 (H) 03/27/2018    Therapeutic Level Labs: No results found for: LITHIUM Lab Results  Component Value Date   VALPROATE <10 (L) 01/29/2022   VALPROATE <10 (L) 01/29/2022   No results found for: CBMZ  Current Medications: Current Outpatient Medications  Medication Sig Dispense Refill   amLODipine  (NORVASC ) 10 MG tablet Take 10 mg by mouth daily. For high blood pressure     celecoxib (CELEBREX) 200 MG capsule Take 200 mg by mouth.     cholecalciferol  (VITAMIN D3) 25 MCG (1000 UNIT) tablet Take 1,000 Units by mouth daily.     cloBAZam  (ONFI ) 10 MG tablet Take 0.5 tablets (5 mg total) by mouth at bedtime. 15 tablet 0   donepezil  (ARICEPT ) 10 MG tablet Take 10 mg by mouth at bedtime.     hydrochlorothiazide  (HYDRODIURIL ) 12.5 MG tablet Take 12.5 mg by mouth.     lamoTRIgine  (LAMICTAL ) 100 MG tablet Take 1 tablet (100 mg total) by mouth 2 (two) times daily. Along with 25 mg tablet for total of 125 mg bid. 60 tablet 0   lamoTRIgine  (LAMICTAL ) 25 MG tablet Take 1 tablet (25 mg total) by mouth 2 (two) times daily. Along with 100 mg tablet for total of 125 mg bid. 60 tablet 0   losartan (COZAAR) 25 MG tablet Take 25 mg by mouth daily.     oxyCODONE -acetaminophen  (PERCOCET) 5-325 MG tablet Take 1 tablet by mouth every 4 (four) hours as needed for severe pain (pain score 7-10). 30 tablet 0   pantoprazole  (PROTONIX ) 40 MG tablet Take 1 tablet (40 mg total) by mouth daily. 30 tablet 11   propranolol  ER (INDERAL  LA) 60 MG 24 hr capsule Take 60 mg by mouth daily.  11   rosuvastatin  (CRESTOR ) 20 MG tablet Take 20 mg by mouth daily.     Semaglutide ,0.25 or 0.5MG /DOS, 2 MG/3ML SOPN Inject into the skin.     valACYclovir (VALTREX) 1000 MG tablet Take 1,000 mg by mouth.     gabapentin  (NEURONTIN ) 300  MG capsule Take 1 capsule (300 mg total) by mouth 2 (two) times daily. 60 capsule 1   QUEtiapine  (SEROQUEL ) 50 MG tablet Take 1 tablet (50 mg total) by mouth at bedtime. 90 tablet 1   sertraline   (ZOLOFT ) 100 MG tablet Take 1.5 tablets (150 mg total) by mouth daily. 45 tablet 1   No current facility-administered medications for this visit.     Musculoskeletal: Strength & Muscle Tone: within normal limits Gait & Station: walks with walker Patient leans: N/A  Psychiatric Specialty Exam: Review of Systems  Psychiatric/Behavioral:  Positive for sleep disturbance.        Grief    Blood pressure 124/80, pulse 62, temperature 98.3 F (36.8 C), temperature source Temporal, height 5' 1 (1.549 m), weight 177 lb (80.3 kg), SpO2 94%.Body mass index is 33.44 kg/m.  General Appearance: Casual  Eye Contact:  Fair  Speech:  Clear and Coherent  Volume:  Normal  Mood:  grief - improving  Affect:  Congruent  Thought Process:  Goal Directed and Descriptions of Associations: Intact  Orientation:  Full (Time, Place, and Person)  Thought Content: Logical   Suicidal Thoughts:  No  Homicidal Thoughts:  No  Memory:  Immediate;   Fair Recent;   Poor Remote;   Poor  Judgement:  Fair  Insight:  Shallow  Psychomotor Activity:  Normal  Concentration:  Concentration: limited and Attention Span: limited  Recall:  Poor  Fund of Knowledge: Poor  Language: Fair  Akathisia:  No  Handed:  Left  AIMS (if indicated): not done  Assets:  Communication Skills Desire for Improvement Housing Social Support Transportation  ADL's:  Intact  Cognition: Limited  Sleep:  varies   Screenings: AIMS    Flowsheet Row Admission (Discharged) from 08/10/2015 in [facility] INPATIENT BEHAVIORAL MEDICINE  AIMS Total Score 0   AUDIT    Flowsheet Row Admission (Discharged) from 07/02/2021 in [facility] BEHAVIORAL MEDICINE Admission (Discharged) from 08/10/2015 in [facility]PATIENT BEHAVIORAL MEDICINE  Alcohol Use Disorder Identification Test Final Score (AUDIT) 0 0   GAD-7    Flowsheet Row Counselor from 06/27/2023 in [facility] Office Visit from 06/26/2023 in [facility] Office Visit from 01/10/2023 in [facility]  Total GAD-7 Score 3 3 3    PHQ2-9    Flowsheet Row Counselor from 06/27/2023 in [facility] Office Visit from 06/26/2023 in [facility] Office Visit from 01/10/2023 in [facility] Office Visit from 12/14/2014 in [facility] Visit from 11/09/2014 in [facility] - A Dept Of [place]  PHQ-2 Total Score 1 1 1  0 0  PHQ-9 Total Score 4 4 3  -- --   Flowsheet Row Office Visit from 06/26/2023 in [facility] Office Visit from 04/24/2023 in [facility] ED from 01/20/2023 in [facility] Emergency Department at [facility]  C-SSRS RISK CATEGORY No Risk No Risk No Risk     Assessment and Plan: MORNING HALBERG is a 68 year old African-American female with history of schizoaffective disorder, seizure disorder, possible Lewy body dementia, bereavement, presented for a follow-up appointment.  Discussed assessment and plan as noted below.  Schizoaffective disorder bipolar type-improving Continues to have mood symptoms although currently with improvement.  Does report sleep  problems although sleep is not interrupted every night.  Not interested in medication changes at this time. Continue Seroquel  extended release 50 mg at bedtime Continue sertraline  150 mg daily Continue gabapentin  300 mg twice a day Also on Lamictal  which is a mood stabilizer although prescribed for seizures.  Cognitive impairment-possible Lewy body dementia-unstable Continues to have cognitive problems, limited historian, memory deficit still ongoing. Continue gabapentin  300 mg twice daily. Continue  Donepezil  10 mg at bedtime Patient to continue follow-up with neurologist and attends day program at [facility].  Bereavement-improving Currently reports she is coping with grief better than before. Continue psychotherapy sessions.  Long-term current use of cannabis-rule out cannabis use disorder-unstable Pending urine drug screen. Will reevaluate in future session.  Collateral information obtained from niece Sandra Charles as noted above.  Follow-up Follow-up in clinic in 2 months or sooner if needed.  Collaboration of Care: Collaboration of Care: {BH OP Collaboration of Care:[id number]}  Patient/Guardian was advised Release of Information must be obtained prior to any record release in order to collaborate their care with an outside provider. Patient/Guardian was advised if they have not already done so to contact the registration department to sign all necessary forms in order for us  to release information regarding their care.   Consent: Patient/Guardian gives verbal consent for treatment and assignment of benefits for services provided during this visit. Patient/Guardian expressed understanding and agreed to proceed.    Zephan Beauchaine, MD 09/19/2023, 10:32 AM

## 2023-09-24 ENCOUNTER — Ambulatory Visit (INDEPENDENT_AMBULATORY_CARE_PROVIDER_SITE_OTHER): Payer: Medicare (Managed Care) | Admitting: Professional Counselor

## 2023-09-24 DIAGNOSIS — F250 Schizoaffective disorder, bipolar type: Secondary | ICD-10-CM

## 2023-09-24 DIAGNOSIS — Z634 Disappearance and death of family member: Secondary | ICD-10-CM | POA: Diagnosis not present

## 2023-09-24 NOTE — Progress Notes (Signed)
  THERAPIST PROGRESS NOTE  Session Time: 10:45 AM - 11:25 AM   Participation Level: Active  Behavioral Response: Casual, Alert, Dysphoric  Type of Therapy: Individual Therapy  Treatment Goals addressed: Active BH CCP SCHIZOAFFECTIVE - BIPOLAR TYPE    LTG: Stand by myself, because someone's always with me because they're scared I'm going to leave a burner on or something. I'd like to remember things better than I do.                Start:  07/16/23    Expected End:  07/14/24      STG: Be independent. I would like to walk better, to walk on my own. To improve adjustment to health issues AEB restructuring unhealthy thinking patterns and working towards acceptance over the next 90 days.     STG: Sometimes I have anxiety. To reduce sxs of anxiety AEB reduction in GAD7 screenings but using coping mechanisms 3 out of 7 days a week for 12 weeks.    STG: I told them you have to call me in the morning, don't tell me the night before and expect me to remember the next day. To improve memory AEB increased use of coping mechanisms to support memory functioning over the next 90 days.   ProgressTowards Goals: Progressing  Interventions: Motivational Interviewing, Conservator, museum/gallery, and Supportive  Summary: Sandra Charles is a 68 y.o. female who presents with schizoaffective disorder - bipolar type and bereavement. She appeared alert and oriented x5. She reported things are going well overall. She continues to struggle with frustration about her family trying to control her. She also expressed frustration with her sister for not giving her any privacy. Sandra Charles appeared ambivalent about assertive communication and I statements, citing she has tried before and her sister doesn't care. She noted [facility]ACE has gotten better though and she enjoys playing BINGO there.   Therapist Response: Conducted session with Sandra Charles. Began session with check-in/update since previous session. Utilized empathetic and  reflective listening. Used open-ended questions to facilitate discussion and summarized thoughts/feelings. Discussed assertive communication and I statements to potentially improve objective effectiveness. Scheduled additional appointment and concluded session.   Suicidal/Homicidal: No  Plan: Return again in 5 weeks.  Diagnosis: Schizoaffective disorder, bipolar type (HCC)  Bereavement  Collaboration of Care: Medication Management AEB chart review  Patient/Guardian was advised Release of Information must be obtained prior to any record release in order to collaborate their care with an outside provider. Patient/Guardian was advised if they have not already done so to contact the registration department to sign all necessary forms in order for us  to release information regarding their care.   Consent: Patient/Guardian gives verbal consent for treatment and assignment of benefits for services provided during this visit. Patient/Guardian expressed understanding and agreed to proceed.   [name], [facility] 09/24/2023

## 2023-10-29 ENCOUNTER — Ambulatory Visit (INDEPENDENT_AMBULATORY_CARE_PROVIDER_SITE_OTHER): Admitting: Professional Counselor

## 2023-10-29 DIAGNOSIS — F250 Schizoaffective disorder, bipolar type: Secondary | ICD-10-CM

## 2023-10-29 DIAGNOSIS — Z634 Disappearance and death of family member: Secondary | ICD-10-CM

## 2023-10-29 NOTE — Progress Notes (Unsigned)
  THERAPIST PROGRESS NOTE  Session Time: 1:00 PM - 1:53 PM   Participation Level: Active  Behavioral Response: Casual, Alert, Irritable  Type of Therapy: Individual Therapy  Treatment Goals addressed: Active BH CCP SCHIZOAFFECTIVE - BIPOLAR TYPE    LTG: Stand by myself, because someone's always with me because they're scared I'm going to leave a burner on or something. I'd like to remember things better than I do.                Start:  07/16/23    Expected End:  07/14/24      STG: Be independent. I would like to walk better, to walk on my own. To improve adjustment to health issues AEB restructuring unhealthy thinking patterns and working towards acceptance over the next 90 days.     STG: Sometimes I have anxiety. To reduce sxs of anxiety AEB reduction in GAD7 screenings but using coping mechanisms 3 out of 7 days a week for 12 weeks.    STG: I told them you have to call me in the morning, don't tell me the night before and expect me to remember the next day. To improve memory AEB increased use of coping mechanisms to support memory functioning over the next 90 days.   ProgressTowards Goals: Progressing  Interventions: Motivational Interviewing, Solution Focused, and Supportive  Summary: Sandra Charles is a 68 y.o. female who presents with schizoaffective disorder - bipolar type and bereavement. She appeared alert and oriented x5. She reported things are the same. [name] continues to struggle with getting privacy and noted frustration with her family, particularly her sister. She engaged in discussion but noted she has tried most possible solutions. Sandra Charles shared that she would like her sister to come to a session as maybe this would be helpful.   Therapist Response: Conducted session with Sandra Charles. Began session with check-in/update since previous session. Utilized empathetic and reflective listening. Used open-ended questions to facilitate discussion and summarized Sandra Charles's  thoughts/feelings. Explored possible ways to set boundaries or obtain objective effectiveness (privacy/time alone). Scheduled additional appointment and concluded session.   Suicidal/Homicidal: No  Plan: Return again in 4 weeks.  Diagnosis: Schizoaffective disorder, bipolar type (HCC)  Bereavement  Collaboration of Care: Medication Management AEB chart review  Patient/Guardian was advised Release of Information must be obtained prior to any record release in order to collaborate their care with an outside provider. Patient/Guardian was advised if they have not already done so to contact the registration department to sign all necessary forms in order for us  to release information regarding their care.   Consent: Patient/Guardian gives verbal consent for treatment and assignment of benefits for services provided during this visit. Patient/Guardian expressed understanding and agreed to proceed.   [name], [facility] 10/29/2023

## 2023-11-04 ENCOUNTER — Telehealth: Payer: Self-pay | Admitting: Psychiatry

## 2023-11-04 NOTE — Telephone Encounter (Signed)
 Returned call to Ms.Sandra Charles, niece.  According to niece patient with worsening behavioral problems difficulty with peers at the [facility] program.  Discussed that we will coordinate care with therapist Ms.Gainey with whom patient recently had a session with.  Discussed to place the patient on a priority list for a sooner appointment and evaluate for further medication changes as needed.

## 2023-11-06 ENCOUNTER — Ambulatory Visit (INDEPENDENT_AMBULATORY_CARE_PROVIDER_SITE_OTHER): Admitting: Professional Counselor

## 2023-11-06 DIAGNOSIS — F250 Schizoaffective disorder, bipolar type: Secondary | ICD-10-CM | POA: Diagnosis not present

## 2023-11-06 DIAGNOSIS — Z634 Disappearance and death of family member: Secondary | ICD-10-CM | POA: Diagnosis not present
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
## 2023-11-20 ENCOUNTER — Ambulatory Visit: Admitting: Psychiatry

## 2023-11-22 LAB — LAB REPORT - SCANNED
A1c: 5.7
EGFR: 77
TSH: 0.655

## 2023-11-26 ENCOUNTER — Ambulatory Visit: Admitting: Professional Counselor

## 2023-11-27 ENCOUNTER — Ambulatory Visit: Admitting: Professional Counselor

## 2023-11-27 DIAGNOSIS — Z634 Disappearance and death of family member: Secondary | ICD-10-CM

## 2023-11-27 DIAGNOSIS — F250 Schizoaffective disorder, bipolar type: Secondary | ICD-10-CM
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
## 2023-12-03 NOTE — Progress Notes (Signed)
 Noelie is seen via video telehealth visit for management of her seizure disorder.  She was accompanied by her son Penne.   Interval history (12/03/2023): We last saw her in August 2025.  Her son believes that her last seizure was associated with a hospitalization.  The medical record documents a hospitalization on January 31, 2024.  She had had a seizure the day before and then was confused the day after.  He is not certain of her last seizure but believes it was associated with the hospitalization.  He is confident that it has been more than a year since she had a seizure.  She remains on lamotrigine  125 mg twice daily and clobazam  5 mg nightly.  Her lamotrigine  level at the time of her last seizure was 10, while utilizing this dosage.  Clobazam  was apparently added at the time of this last seizure.  She lives with her son.  She attends [facility] a day program who also supervises her medications.  She is not pushing for any change in management.   Interval history (10/11/2023): Emylee is seen back in follow-up for her seizure disorder in the setting of schizoaffective disorder and probable Lewy body dementia.  We last saw her in August 2022.  At that time she had had 2 tonic-clonic seizures in the preceding 6 months.  We increased her lamotrigine  to 125 mg twice a day.  When she presented in early May 2023 with paranoid delusions her level was 8.0 on this dose.  She subsequently was hospitalized for nearly the whole month of May.  When she was discharged her lamotrigine  was lowered from 125 mg twice daily down to 125 mg in the morning and 100 mg at night.  Of note, she was on both valproate and clobazam  transiently while hospitalized.  There is a note from psychiatry on August 22, 2021 where they mention trying to get prior authorization for clobazam .  However, the patient is not taking either valproate or clobazam  at this time.  The family is not certain of how many seizures she may have had.  The medical record  documents an ER visit in March 2023 due to his seizure.  As noted, she had the prolonged psychiatric hospitalization in May but no seizures are discussed then.  On October 08, 2021 she had another event that may have been a seizure.  She was sitting on the porch with her husband.  She got up in a confused fashion and walked out into the street.  Her husband did not notice her to have a seizure and as such she certainly did not have a tonic-clonic seizure.  However, it is possible that she may have had a focal seizure that led to confusion without his being aware of it.  In any case, she was evaluated in the emergency room and was noted to be confused and her mental status gradually cleared.  Previous history: Her neurologic history is quite complex.  The great majority of it was obtained from the chart.  She has longstanding schizoaffective disorder.  In March 2017 she awakened confused and having bitten her tongue.  The assumption was that she had had a seizure.  She was not treated at that time.  In May 2017 she presented with confusion.  MRI demonstrated bilateral increased T2 in the hippocampi raising the concern for herpes versus status epilepticus.  CSF had mild leukocytosis but otherwise was negative.  She was placed on valproate at that time.  Subsequently she had waxing  and waning mental status with auditory and visual hallucinations.  EEG demonstrated some diffuse slowing, left temporal slowing, and occasional left hemisphere sharps.  She was readmitted in August 2017, again with altered mental status.  No clear etiology was identified.  MRI scan was normal and the temporal lobe abnormalities were no longer identified.  EEG just demonstrated diffuse slowing.  Subsequently she did receive the diagnosis of Lewy body dementia as the best clinical explanation for her waxing and waning mental status with associated hallucinations.  Her antiseizure medication was switched from valproate to Lamictal .  However,  she was maintained only on low-dose Lamictal  in the range of 50 twice daily up to 100 twice daily.  Levels around this time are not available.  She was lost to neurologic follow-up for approximately 2 years.  She did present twice in 2018-12-26 with witnessed seizures.  She was loaded with levetiracetam  during at least one of the hospitalizations but maintenance levetiracetam  is not clearly documented.   Again, her primary AED has been lamotrigine  at a relatively low dose.  When we saw her in March 2022 she had had 2 seizures in the previous year.  We checked her lamotrigine  level and it was 5.6.  After that visit we went up on the lamotrigine  from 75 mg twice daily to 100 mg twice daily.  She had 1 seizure shortly after that visit and then she had a second seizure on August 25, 2020.  That 1 was apparently prolonged and she was carried to the emergency room.  She was found to be hypoxic and was intubated for apparent flash pulmonary edema.  She remained in the hospital for a few days and periods of agitation and hallucination.  She subsequently was discharged and has done well subsequently.  She was discharged on a slightly higher dose of lamotrigine  100 the morning 125 mg at night.  She is tolerating the lamotrigine  without identified side effects.  She is followed by psychiatry.  She is on Aricept  10 mg/day, clonidine  0.1 mg twice daily, Seroquel  50 mg nightly and Zoloft  100 mg/day.  Her son relates that she has been doing reasonably well in terms of her cognition and emotional health, outside of the events of the hospital.  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  She remains on lamotrigine  and low-dose clobazam  (which was added at the time of that last seizure.  They are not pushing for any change in her medicines.  I did ask to have her blood levels  checked near her home did serve as a baseline as she is doing well.  Plan: Continue lamotrigine  to 125 mg twice daily Continue clobazam  5 mg nightly Patient update Dr. Jerene in the event of additional seizures Return to clinic 1 year   This video encounter was conducted with the patient's (or proxy's) verbal consent via secure, interactive audio and video telecommunications while in clinic/office/hospital.  The patient (or proxy) was instructed to have this encounter in a suitably private space and to only have persons present to whom they give permission to participate. In addition, patient identity was confirmed by use of name plus an additional identifier.  December 26, 2019 E&M) This visit was coded based on time. I spent a total of 30 minutes in both face-to-face and non-face-to-face activities for this visit on the date of this encounter.  I personally performed the service. (TP)  RODNEY A RADTKE, MD

## 2023-12-12 ENCOUNTER — Other Ambulatory Visit: Payer: Self-pay

## 2023-12-12 ENCOUNTER — Encounter: Payer: Self-pay | Admitting: Psychiatry

## 2023-12-12 ENCOUNTER — Ambulatory Visit: Admitting: Psychiatry

## 2023-12-12 VITALS — BP 137/65 | HR 65 | Temp 97.7°F | Ht 61.0 in | Wt 177.0 lb

## 2023-12-12 DIAGNOSIS — F250 Schizoaffective disorder, bipolar type: Secondary | ICD-10-CM | POA: Diagnosis not present

## 2023-12-12 DIAGNOSIS — Z634 Disappearance and death of family member: Secondary | ICD-10-CM | POA: Diagnosis not present

## 2023-12-12 DIAGNOSIS — F1290 Cannabis use, unspecified, uncomplicated: Secondary | ICD-10-CM

## 2023-12-12 DIAGNOSIS — F09 Unspecified mental disorder due to known physiological condition: Secondary | ICD-10-CM

## 2023-12-12 MED ORDER — QUETIAPINE FUMARATE 25 MG PO TABS: 12.5000 mg | ORAL_TABLET | Freq: Every day | ORAL | 1 refills | Status: DC | PRN

## 2023-12-12 MED ORDER — QUETIAPINE FUMARATE 50 MG PO TABS: 50.0000 mg | ORAL_TABLET | Freq: Every day | ORAL | Status: DC

## 2023-12-12 MED ORDER — GABAPENTIN 300 MG PO CAPS: 300.0000 mg | ORAL_CAPSULE | Freq: Two times a day (BID) | ORAL | Status: AC

## 2023-12-12 MED ORDER — QUETIAPINE FUMARATE 50 MG PO TABS: 50.0000 mg | ORAL_TABLET | Freq: Every day | ORAL | 0 refills | Status: DC

## 2023-12-12 MED ORDER — SERTRALINE HCL 100 MG PO TABS: 150.0000 mg | ORAL_TABLET | Freq: Every day | ORAL | 5 refills | Status: AC
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
## 2023-12-17 ENCOUNTER — Ambulatory Visit: Admitting: Professional Counselor

## 2023-12-17 DIAGNOSIS — F250 Schizoaffective disorder, bipolar type: Secondary | ICD-10-CM

## 2023-12-17 DIAGNOSIS — Z634 Disappearance and death of family member: Secondary | ICD-10-CM

## 2023-12-17 NOTE — Progress Notes (Unsigned)
 THERAPIST PROGRESS NOTE  Session Time: 11:00 AM - 11:50 AM   Participation Level: Active  Behavioral Response: Well Groomed, Alert, Irritable  Type of Therapy: Individual Therapy  Treatment Goals addressed: Active BH CCP SCHIZOAFFECTIVE - BIPOLAR TYPE  LTG: Stand by myself, because someone's always with me because they're scared I'm going to leave a burner on or something. I'd like to remember things better than I do. (Progressing)                Start:  07/16/23    Expected End:  07/14/24    Goal Note Reviewed 11/27/23 - Yeah. I clean up. I dust, vacuum, whatever. Sometimes I even fix me something to eat now. It's (stove) back on now.   STG: Be independent. I would like to walk better, to walk on my own. To improve adjustment to health issues AEB restructuring unhealthy thinking patterns and working towards acceptance over the next 90 days.  (Progressing)  Goal Note Reviewed 11/27/23 - I walk good at home. I don't use my walker. The only reason I use the walker is because I go to that place everyday and I have to still use it because I wear that brace.   STG: Sometimes I have anxiety. To reduce sxs of anxiety AEB reduction in GAD7 screenings but using coping mechanisms 3 out of 7 days a week for 12 weeks. (Progressing)  Goal Note Reviewed 11/27/23 - It's been good. Most of the anxiety came around my family.    STG: I told them you have to call me in the morning, don't tell me the night before and expect me to remember the next day. To improve memory AEB increased use of coping mechanisms to support memory functioning over the next 90 days.  (Progressing)  Goal Note Reviewed 11/27/23 - That's a whole lot better. I remember a lot of things. Every once in a awhile they'll even let me spend the night by myself.  ProgressTowards Goals: Progressing  Interventions: Motivational Interviewing, Conservator, museum/gallery, and Supportive  Summary: DARL BRISBIN is a 68 y.o. female who  presents with a history of schizoaffective disorder and bereavement. She appeared irritated but oriented x5. She reported her sister was fussing at her all the way here. She engaged in discussion about this, citing her sister being angry with her for forgetting things. Klover appeared ambivalent on changing her response having an affect on how her sister behaves. She engaged in discussion about day program, other's behaviors, and her own. She expressed things have been better there and she denied concerns.  Therapist Response: Conducted session with Ronal. Began session with check-in/update since previous session. Utilized empathetic and reflective listening. Used open-ended questions to facilitate discussion and summarized Nikki's thoughts/feelings. Attempted to explore ways Laneya can respond differently to elicit different responses from her sister. Inquired about day program and explored potential concerns with hers and other's behaviors. Scheduled additional appointment and concluded session.   Suicidal/Homicidal: No  Plan: Return again in 2 weeks.  Diagnosis: Schizoaffective disorder, bipolar type (HCC)  Bereavement  Collaboration of Care: Medication Management AEB chart review  Patient/Guardian was advised Release of Information must be obtained prior to any record release in order to collaborate their care with an outside provider. Patient/Guardian was advised if they have not already done so to contact the registration department to sign all necessary forms in order for us  to release information regarding their care.   Consent: Patient/Guardian gives verbal consent for treatment and assignment  of benefits for services provided during this visit. Patient/Guardian expressed understanding and agreed to proceed.   [name], [facility] 12/17/2023

## 2023-12-18 ENCOUNTER — Telehealth: Payer: Self-pay

## 2023-12-18 NOTE — Telephone Encounter (Signed)
 Please have patient sign an ROI to call this provider back.

## 2023-12-18 NOTE — Telephone Encounter (Signed)
 Received a voicemail from the patients provider at [facility] requesting to speak to you to discuss the patients medications please advise    Dr. Joen Cypress  [phone]  [facility] [phone]

## 2023-12-18 NOTE — Telephone Encounter (Signed)
 There is not an ROI for Sandra Charles will email the form to patiens son and he will fill out the form and email it back to the office tomorrow

## 2023-12-24 ENCOUNTER — Telehealth: Payer: Self-pay | Admitting: Psychiatry

## 2023-12-24 NOTE — Telephone Encounter (Signed)
 I have reviewed medical records received from [facility] [place]. Clobazam  level-157 ng/mL Lamotrigine  level-9.6 mcg/mL (acceptable range)  CBC with differential-hemoglobin low at 11, MCH low at 25.1, MCHC low at 30.2 otherwise within normal limits.  Platelet count-within normal limits at 152.  CMP-glucose high at 133 Calcium  normal at 9.4, AST-normal at 32, ALT slightly elevated at 38  Lipid panel-within normal limits.  Hemoglobin A1c-slightly elevated at 5.7 TSH-0.655-within normal limits  Vitamin D -within normal limits at 34.4

## 2023-12-27 ENCOUNTER — Telehealth: Payer: Self-pay | Admitting: Psychiatry

## 2023-12-27 DIAGNOSIS — F250 Schizoaffective disorder, bipolar type: Secondary | ICD-10-CM

## 2023-12-27 MED ORDER — QUETIAPINE FUMARATE 50 MG PO TABS: 50.0000 mg | ORAL_TABLET | Freq: Every day | ORAL | Status: AC

## 2023-12-27 NOTE — Telephone Encounter (Signed)
 Returned call to provider at [facility] Dr. Joen Cypress, wanted to discuss a discrepancy with the Seroquel  , wanted to verify if it was an extended release versus an immediate release.  According to our records the last time an extended release was prescribed was in 2023.  However according to the [facility] provider every medication that this provider prescribes is usually represcribed by patient's provider due to insurance requirements.  Patient currently is on an extended release Seroquel  50 mg.  Discussed that as long as patient is doing well on the extended release we can continue the Seroquel  XR 50 mg and that the next time it is represcribed it can be changed on our end to a Seroquel  XR as well.  Patient to stay on the current medication regimen at this time.

## 2023-12-27 NOTE — Telephone Encounter (Signed)
Will forward this to Dr. Shea Evans

## 2023-12-27 NOTE — Telephone Encounter (Signed)
 Provider contacted and another note done in EPIC as well.

## 2023-12-27 NOTE — Telephone Encounter (Signed)
 ROI for 2 way communication to speak to [facility] scanned in chart.

## 2024-01-02 ENCOUNTER — Ambulatory Visit (INDEPENDENT_AMBULATORY_CARE_PROVIDER_SITE_OTHER): Admitting: Professional Counselor

## 2024-01-02 DIAGNOSIS — Z634 Disappearance and death of family member: Secondary | ICD-10-CM

## 2024-01-02 DIAGNOSIS — F250 Schizoaffective disorder, bipolar type: Secondary | ICD-10-CM | POA: Diagnosis not present

## 2024-01-02 NOTE — Progress Notes (Signed)
  THERAPIST PROGRESS NOTE  Session Time: 11:00 AM - 11:41 AM  Participation Level: Active  Behavioral Response: Casual, Alert, Euthymic  Type of Therapy: Individual Therapy  Treatment Goals addressed:  Active BH CCP SCHIZOAFFECTIVE - BIPOLAR TYPE  LTG: Stand by myself, because someone's always with me because they're scared I'm going to leave a burner on or something. I'd like to remember things better than I do. (Progressing)                Start:  07/16/23    Expected End:  07/14/24    Goal Note Reviewed 11/27/23 - Yeah. I clean up. I dust, vacuum, whatever. Sometimes I even fix me something to eat now. It's (stove) back on now.   STG: Be independent. I would like to walk better, to walk on my own. To improve adjustment to health issues AEB restructuring unhealthy thinking patterns and working towards acceptance over the next 90 days.  (Progressing)  Goal Note Reviewed 11/27/23 - I walk good at home. I don't use my walker. The only reason I use the walker is because I go to that place everyday and I have to still use it because I wear that brace.   STG: Sometimes I have anxiety. To reduce sxs of anxiety AEB reduction in GAD7 screenings but using coping mechanisms 3 out of 7 days a week for 12 weeks. (Progressing)  Goal Note Reviewed 11/27/23 - It's been good. Most of the anxiety came around my family.    STG: I told them you have to call me in the morning, don't tell me the night before and expect me to remember the next day. To improve memory AEB increased use of coping mechanisms to support memory functioning over the next 90 days.  (Progressing)  Goal Note Reviewed 11/27/23 - That's a whole lot better. I remember a lot of things. Every once in a awhile they'll even let me spend the night by myself.  ProgressTowards Goals: Progressing  Interventions: Motivational Interviewing and Supportive  Summary: Sandra Charles is a 68 y.o. female who presents with schizoaffective  disorder and bereavement. She appeared alert and oriented x5. She stated things are going okay. She engaged in discussion about childhood memories of pets and how she would like to have a pet now. Sandra Charles noted improvement at the day program and how she would to continue with the current separation to avoid conflict in the future.   Therapist Response: Conducted session with Sandra Charles. Began session with check-in/update since previous session. Utilized empathetic and reflective listening. Used open-ended questions to facilitate discussion and summarized Sandra Charles's thoughts/feelings. Scheduled additional appointment and concluded session.   Suicidal/Homicidal: No  Plan: Return again in 4 weeks.  Diagnosis: Schizoaffective disorder, bipolar type (HCC)  Bereavement  Collaboration of Care: Medication Management AEB chart review  Patient/Guardian was advised Release of Information must be obtained prior to any record release in order to collaborate their care with an outside provider. Patient/Guardian was advised if they have not already done so to contact the registration department to sign all necessary forms in order for us  to release information regarding their care.   Consent: Patient/Guardian gives verbal consent for treatment and assignment of benefits for services provided during this visit. Patient/Guardian expressed understanding and agreed to proceed.   Sandra Charles, [facility] 01/02/2024

## 2024-01-28 ENCOUNTER — Ambulatory Visit (INDEPENDENT_AMBULATORY_CARE_PROVIDER_SITE_OTHER): Admitting: Professional Counselor

## 2024-01-28 DIAGNOSIS — F250 Schizoaffective disorder, bipolar type: Secondary | ICD-10-CM | POA: Diagnosis not present

## 2024-01-28 DIAGNOSIS — Z634 Disappearance and death of family member: Secondary | ICD-10-CM

## 2024-01-28 NOTE — Progress Notes (Signed)
 THERAPIST PROGRESS NOTE  Session Time: 3:30 pm-4:23 pm  Participation Level: Active  Behavioral Response: Well Groomed, Alert, Euthymic  Type of Therapy: Individual Therapy  Treatment Goals addressed: Active BH CCP SCHIZOAFFECTIVE - BIPOLAR TYPE  LTG: Stand by myself, because someone's always with me because they're scared I'm going to leave a burner on or something. I'd like to remember things better than I do. (Progressing)                Start:  07/16/23    Expected End:  07/14/24    Goal Note Reviewed 11/27/23 - Yeah. I clean up. I dust, vacuum, whatever. Sometimes I even fix me something to eat now. It's (stove) back on now.   STG: Be independent. I would like to walk better, to walk on my own. To improve adjustment to health issues AEB restructuring unhealthy thinking patterns and working towards acceptance over the next 90 days.  (Progressing)  Goal Note Reviewed 11/27/23 - I walk good at home. I don't use my walker. The only reason I use the walker is because I go to that place everyday and I have to still use it because I wear that brace.   STG: Sometimes I have anxiety. To reduce sxs of anxiety AEB reduction in GAD7 screenings but using coping mechanisms 3 out of 7 days a week for 12 weeks. (Progressing)  Goal Note Reviewed 11/27/23 - It's been good. Most of the anxiety came around my family.    STG: I told them you have to call me in the morning, don't tell me the night before and expect me to remember the next day. To improve memory AEB increased use of coping mechanisms to support memory functioning over the next 90 days.  (Progressing)  Goal Note Reviewed 11/27/23 - That's a whole lot better. I remember a lot of things. Every once in a awhile they'll even let me spend the night by myself.  ProgressTowards Goals: Progressing  Interventions: Motivational Interviewing and Supportive  Summary: LOUNA ROTHGEB is a 68 y.o. female who presents with a history of  schizoaffective disorder and bereavement. She appeared alert and oriented x5. She reported Thanksgiving went well. They enjoyed dinner at her niece's house with most of the extended family. Lezley reported one of her sisters is Jehovah Witness now and doesn't celebrate any holidays. She engaged in discussion about her upbringing and beliefs. She reported plans for Christmas decorating. She mentioned sadness over her partner not being there in person anymore but reported he is always there in memory. Pamila reported things are going well overall as she continues to regain independence and privacy from family.   Therapist Response: Conducted session with Ronal. Began session with check-in/update since previous session. Utilized empathetic and reflective listening. Used open-ended questions to facilitate discussion and summarized Riyana's thoughts/feelings. Explored how upbringing has contributed to current belief system. Normalized feelings of sadness/grief over holidays. Provided positive reinforcement for Aitanna gaining more independence. Scheduled additional appointment and concluded session.    Suicidal/Homicidal: No  Plan: Return again in 5 weeks.  Diagnosis: Schizoaffective disorder, bipolar type (HCC)  Bereavement  Collaboration of Care: Medication Management AEB Chart review  Patient/Guardian was advised Release of Information must be obtained prior to any record release in order to collaborate their care with an outside provider. Patient/Guardian was advised if they have not already done so to contact the registration department to sign all necessary forms in order for us  to release information regarding their care.  Consent: Patient/Guardian gives verbal consent for treatment and assignment of benefits for services provided during this visit. Patient/Guardian expressed understanding and agreed to proceed.   [name], [facility] 01/28/2024

## 2024-02-05 ENCOUNTER — Ambulatory Visit: Payer: Self-pay | Admitting: Psychiatry

## 2024-02-10 ENCOUNTER — Other Ambulatory Visit: Payer: Self-pay

## 2024-02-10 ENCOUNTER — Encounter: Payer: Self-pay | Admitting: Psychiatry

## 2024-02-10 ENCOUNTER — Ambulatory Visit: Admitting: Psychiatry

## 2024-02-10 VITALS — BP 139/64 | HR 60 | Temp 97.7°F | Ht 61.0 in | Wt 178.8 lb

## 2024-02-10 DIAGNOSIS — F250 Schizoaffective disorder, bipolar type: Secondary | ICD-10-CM

## 2024-02-10 DIAGNOSIS — F09 Unspecified mental disorder due to known physiological condition: Secondary | ICD-10-CM

## 2024-02-10 DIAGNOSIS — F1290 Cannabis use, unspecified, uncomplicated: Secondary | ICD-10-CM

## 2024-02-10 DIAGNOSIS — Z634 Disappearance and death of family member: Secondary | ICD-10-CM

## 2024-02-10 MED ORDER — QUETIAPINE FUMARATE 25 MG PO TABS: 25.0000 mg | ORAL_TABLET | Freq: Two times a day (BID) | ORAL | 1 refills | Status: AC | PRN

## 2024-02-10 NOTE — Progress Notes (Unsigned)
 BH MD OP Progress Note  02/10/2024 11:13 AM Sandra Charles  MRN:  [id number]  Chief Complaint:  Chief Complaint  Patient presents with   Follow-up   Anxiety   Depression   Medication Refill   Discussed the use of AI scribe software for clinical note transcription with the patient, who gave verbal consent to proceed.  History of Present Illness Sandra Charles is a 68 year old African-American female, currently lives in [place], has a history of schizoaffective disorder, probable Lewy body dementia, hypertension, dyslipidemia, seizure disorder, prediabetes, gastroesophageal reflux disease was evaluated in office today for a follow-up appointment.  She is accompanied by her niece.  She reports coping with grief following the loss of her partner and describes her mood as generally good. She states that she is able to take care of herself and interact with her family, though her niece notes that family members sometimes perceive her as irritable, which she reports relates to their dynamic. She denies any thoughts of hurting herself or others. She reports sleeping through the night.  Both she and her niece describe ongoing and progressive memory impairment. She is unable to recall the current date or the name of the president and states that she does not keep up with the days. Her niece reports that she frequently forgets information and requires repeated reminders. The niece believes that the progression of memory loss affects her mental state and daily functioning. She attends a day program at [facility]  and reports that it is going well. At the program, staff give her headphones to help her manage agitation, which her niece reports is effective in that setting but not at home.  Episodes of increased fidgetiness and inability to remain still, as described by her niece, have been progressively worsening. These symptoms prompted her to begin administering Seroquel  25 mg last week. The niece states  that Seroquel  provides some improvement for a few hours but does not fully resolve the agitation and does not cause sedation.  She is currently compliant on sertraline  150 mg daily, Seroquel  50 mg at bedtime.  She reports sleep and appetite is fair.  Her niece reports that the family is considering increasing support at home, such as sending her to the day program 5 days a week and arranging for an aide to assist in the evenings, due to the increasing burden of care.     Visit Diagnosis:    ICD-10-CM   1. Schizoaffective disorder, bipolar type (HCC)  F25.0 QUEtiapine  (SEROQUEL ) 25 MG tablet    2. Bereavement  Z63.4     3. Long term current use of cannabis  F12.90     4. Cognitive disorder  F09    Probable Lewy body dementia      Past Psychiatric History: I have reviewed past psychiatric history from progress note on 01/10/2023.  Patient with multiple inpatient behavioral health admissions most recent one in 2023 [facility].  Denies suicide attempts.  Past medication trials include Celexa , Xanax , Depakote , Lamictal .  Past Medical History:  Past Medical History:  Diagnosis Date   Anxiety    Arthritis    all over   Chronic lower back pain    Dementia (HCC)    Depression    GERD (gastroesophageal reflux disease)    Headache    weekly (07/15/2015)   Hyperlipidemia    Hypertension    Lewy body dementia (HCC)    Mini stroke    several since 05/2014 (07/15/2015)   Psychosis  in elderly, without behavioral disturbance (HCC)    Seizures (HCC) dx'd 04/2015    Past Surgical History:  Procedure Laterality Date   COLONOSCOPY WITH PROPOFOL  N/A 11/20/2021   Procedure: COLONOSCOPY WITH PROPOFOL ;  Surgeon: Maryruth Ole DASEN, MD;  Location: [facility] ENDOSCOPY;  Service: Endoscopy;  Laterality: N/A;   DILATION AND CURETTAGE OF UTERUS     ESOPHAGOGASTRODUODENOSCOPY (EGD) WITH PROPOFOL  N/A 11/20/2021   Procedure: ESOPHAGOGASTRODUODENOSCOPY (EGD) WITH PROPOFOL ;  Surgeon: Maryruth Ole DASEN, MD;   Location: [facility] ENDOSCOPY;  Service: Endoscopy;  Laterality: N/A;   FRACTURE SURGERY     LUMBAR PUNCTURE  07/14/2015   ORIF ANKLE FRACTURE Right 01/20/2021   Procedure: OPEN REDUCTION INTERNAL FIXATION (ORIF) ANKLE FRACTURE;  Surgeon: Edie Norleen PARAS, MD;  Location: [facility] ORS;  Service: Orthopedics;  Laterality: Right;   TUBAL LIGATION     VAGINAL HYSTERECTOMY      Family Psychiatric History: Reviewed family psychiatric history from progress note on 01/10/2023.  Family History:  Family History  Problem Relation Age of Onset   Hyperlipidemia Mother    Breast cancer Mother 68   Hypertension Mother    Heart attack Mother        age 50's   Hyperlipidemia Father    Hypertension Father    Heart attack Father 67   Breast cancer Sister 22   Mental illness No known conditions     Social History: I have reviewed social history from progress note on 01/10/2023. Social History   Socioeconomic History   Marital status: Single    Spouse name: Not on file   Number of children: 1   Years of education: Not on file   Highest education level: Not on file  Occupational History   Occupation: disabled  Tobacco Use   Smoking status: Former    Current packs/day: 1.00    Average packs/day: 1 pack/day for 5.0 years (5.0 ttl pk-yrs)    Types: Cigarettes   Smokeless tobacco: Never   Tobacco comments:    quit smoking cigarettes in the early 2000s  Vaping Use   Vaping status: Never Used  Substance and Sexual Activity   Alcohol use: Not Currently    Comment: 07/15/2015 nothing in the last few years   Drug use: No   Sexual activity: Not Currently  Other Topics Concern   Not on file  Social History Narrative   Lives with significant other, has son who comes by daily.   Social Drivers of Health   Tobacco Use: Medium Risk (02/10/2024)   Patient History    Smoking Tobacco Use: Former    Smokeless Tobacco Use: Never    Passive Exposure: Not on file  Financial Resource Strain: Low Risk   (07/29/2023)   Received from [facility]   Overall Financial Resource Strain (CARDIA)    Difficulty of Paying Living Expenses: Not hard at all  Recent Concern: Financial Resource Strain - Medium Risk (06/27/2023)   Overall Financial Resource Strain (CARDIA)    Difficulty of Paying Living Expenses: Somewhat hard  Food Insecurity: No Food Insecurity (07/29/2023)   Received from [facility]   Epic    Within the past 12 months, you worried that your food would run out before you got the money to buy more.: Never true    Within the past 12 months, the food you bought just didn't last and you didn't have money to get more.: Never true  Transportation Needs: No Transportation Needs (07/29/2023)   Received from  [facility]   PRAPARE - Transportation    In the past 12 months, has lack of transportation kept you from medical appointments or from getting medications?: No    Lack of Transportation (Non-Medical): No  Physical Activity: Inactive (06/27/2023)   Exercise Vital Sign    Days of Exercise per Week: 0 days    Minutes of Exercise per Session: 0 min  Stress: No Stress Concern Present (06/27/2023)   [employer] of Occupational Health - Occupational Stress Questionnaire    Feeling of Stress : Not at all  Social Connections: Moderately Isolated (06/27/2023)   Social Connection and Isolation Panel    Frequency of Communication with Friends and Family: More than three times a week    Frequency of Social Gatherings with Friends and Family: More than three times a week    Attends Religious Services: More than 4 times per year    Active Member of [employer] or Organizations: No    Attends Banker Meetings: Never    Marital Status: Widowed  Depression (PHQ2-9): Medium Risk (02/10/2024)   Depression (PHQ2-9)    PHQ-2 Score: 6  Alcohol Screen: Low Risk (07/02/2021)   Alcohol Screen    Last Alcohol Screening Score (AUDIT): 0  Housing: Low Risk   (07/29/2023)   Received from [facility]   Epic    In the last 12 months, was there a time when you were not able to pay the mortgage or rent on time?: No    In the past 12 months, how many times have you moved where you were living?: 0    At any time in the past 12 months, were you homeless or living in a shelter (including now)?: No  Utilities: Not At Risk (07/29/2023)   Received from [facility]    Threatened with loss of utilities: No  Health Literacy: Inadequate Health Literacy (06/27/2023)   B1300 Health Literacy    Frequency of need for help with medical instructions: Always    Allergies: Allergies[1]  Metabolic Disorder Labs: Lab Results  Component Value Date   HGBA1C 5.6 07/05/2021   MPG 114.02 07/05/2021   Lab Results  Component Value Date   PROLACTIN 9.4 08/10/2015   PROLACTIN 7.1 07/12/2015   Lab Results  Component Value Date   CHOL 190 07/05/2021   TRIG 100 07/05/2021   HDL 49 07/05/2021   CHOLHDL 3.9 07/05/2021   VLDL 20 07/05/2021   LDLCALC 121 (H) 07/05/2021   LDLCALC 48 08/10/2015   Lab Results  Component Value Date   TSH 0.934 08/27/2020   TSH 6.026 (H) 03/27/2018    Therapeutic Level Labs: No results found for: LITHIUM Lab Results  Component Value Date   VALPROATE <10 (L) 01/29/2022   VALPROATE <10 (L) 01/29/2022   No results found for: CBMZ  Current Medications: Current Outpatient Medications  Medication Sig Dispense Refill   ASPIRIN  LOW DOSE 81 MG tablet Take 81 mg by mouth daily.     amLODipine  (NORVASC ) 10 MG tablet Take 10 mg by mouth daily. For high blood pressure     celecoxib (CELEBREX) 200 MG capsule Take 200 mg by mouth.     cholecalciferol  (VITAMIN D3) 25 MCG (1000 UNIT) tablet Take 1,000 Units by mouth daily.     cloBAZam  (ONFI ) 10 MG tablet Take 0.5 tablets (5 mg total) by mouth at bedtime. 15 tablet 0   donepezil  (ARICEPT ) 10 MG tablet Take 10  mg by mouth at bedtime.      gabapentin  (NEURONTIN ) 300 MG capsule Take 1 capsule (300 mg total) by mouth 2 (two) times daily. Prescribed by neurology     hydrochlorothiazide  (HYDRODIURIL ) 12.5 MG tablet Take 12.5 mg by mouth.     lamoTRIgine  (LAMICTAL ) 100 MG tablet Take 1 tablet (100 mg total) by mouth 2 (two) times daily. Along with 25 mg tablet for total of 125 mg bid. 60 tablet 0   lamoTRIgine  (LAMICTAL ) 25 MG tablet Take 1 tablet (25 mg total) by mouth 2 (two) times daily. Along with 100 mg tablet for total of 125 mg bid. 60 tablet 0   losartan (COZAAR) 25 MG tablet Take 25 mg by mouth daily.     oxyCODONE -acetaminophen  (PERCOCET) 5-325 MG tablet Take 1 tablet by mouth every 4 (four) hours as needed for severe pain (pain score 7-10). 30 tablet 0   pantoprazole  (PROTONIX ) 40 MG tablet Take 1 tablet (40 mg total) by mouth daily. 30 tablet 11   propranolol  ER (INDERAL  LA) 60 MG 24 hr capsule Take 60 mg by mouth daily.  11   QUEtiapine  (SEROQUEL ) 25 MG tablet Take 1 tablet (25 mg total) by mouth 2 (two) times daily as needed. For agitation and anxiety ( DOSE INCREASE) 60 tablet 1   QUEtiapine  (SEROQUEL ) 50 MG tablet Take 1 tablet (50 mg total) by mouth at bedtime. CURRENTLY ON XR  ( Last sent 12/12/2023)     rosuvastatin  (CRESTOR ) 20 MG tablet Take 20 mg by mouth daily.     Semaglutide ,0.25 or 0.5MG /DOS, 2 MG/3ML SOPN Inject into the skin.     sertraline  (ZOLOFT ) 100 MG tablet Take 1.5 tablets (150 mg total) by mouth daily. 45 tablet 5   valACYclovir (VALTREX) 1000 MG tablet Take 1,000 mg by mouth.     No current facility-administered medications for this visit.     Musculoskeletal: Strength & Muscle Tone: within normal limits Gait & Station: slow with walker Patient leans: N/A  Psychiatric Specialty Exam: Review of Systems  Unable to perform ROS: Psychiatric disorder    Blood pressure 139/64, pulse 60, temperature 97.7 F (36.5 C), temperature source Temporal, height 5' 1 (1.549 m), weight 178 lb 12.8 oz (81.1  kg).Body mass index is 33.78 kg/m.  General Appearance: Casual  Eye Contact:  Fair  Speech:  Clear and Coherent  Volume:  Normal  Mood:  Anxious  Affect:  Full Range  Thought Process:  Goal Directed and Descriptions of Associations: Intact  Orientation:  Full (Time, Place, and Person)  Thought Content: Logical   Suicidal Thoughts:  No  Homicidal Thoughts:  No  Memory:  Immediate;   Fair Recent;   Poor Remote;   Poor  Judgement:  Fair  Insight:  Fair  Psychomotor Activity:  Normal  Concentration:  Concentration: Fair and Attention Span: Fair  Recall:  Poor  Fund of Knowledge: Poor  Language: Fair  Akathisia:  No  Handed:  Left  AIMS (if indicated): done  Assets:  Desire for Improvement Housing Social Support Transportation  ADL's:  Intact  Cognition: limited  Sleep:  Fair   Screenings: AIMS    Flowsheet Row Office Visit from 12/12/2023 in [facility] Office Visit from 09/19/2023 in [facility] Admission (Discharged) from 08/10/2015 in [facility] IN[facility]IENT BEHAVIORAL MEDICINE  AIMS Total Score 0 0 0   AUDIT    Flowsheet Row Admission (Discharged) from 07/02/2021 in [facility] BEHAVIORAL MEDIC[facility] Admission (  Discharged) from 08/10/2015 in [facility] INPATIENT BEHAVIORAL MEDICINE  Alcohol Use Disorder Identification Test Final Score (AUDIT) 0 0   GAD-7    Flowsheet Row Office Visit from 12/12/2023 in [facility] Counselor from 11/27/2023 in [facility] Office Visit from 09/19/2023 in [facility] Counselor from 06/27/2023 in [facility] Office Visit from 06/26/2023 in [facility]  Total GAD-7 Score 7 0 6 3 3    PHQ2-9    Flowsheet Row Office Visit from 02/10/2024 in [facility] Office Visit from 12/12/2023 in [facility] Counselor from 11/27/2023 in [facility] Office Visit from 09/19/2023 in [facility] Counselor from 06/27/2023 in [facility]  PHQ-2 Total Score 1 3 1 2 1   PHQ-9 Total Score 6 7 3 5 4    Flowsheet Row Office Visit from 02/10/2024 in [facility] Office Visit from 12/12/2023 in [facility] Office Visit from 09/19/2023 in [facility]  C-SSRS RISK CATEGORY No Risk No Risk No Risk     Assessment and Plan: Sandra Charles is a 68 year old African-American female who presented for a follow-up appointment, discussed assessment and plan as noted below.  1. Schizoaffective disorder, bipolar type (HCC)-improving Does have episodes of behavioral problems likely multifactorial including due to cognitive issues possibly Lewy body dementia. Continue Seroquel  50 mg at bedtime Continue Sertraline  150 mg daily Increase Seroquel  25 mg twice a day as needed for severe anxiety/agitation.  2. Bereavement-improving Does have good support system and reports coping with grief better than before Continue psychotherapy sessions with Ms. Almarie Ligas  3. Long term current use of cannabis-unstable Continues to use cannabis.  Not interested in quitting.  4. Cognitive disorder likely Lewy body dementia-unstable, chronic Currently under the care of neurology. Does attend [facility] program.  Continue support.   Collateral information obtained from niece Heron as noted above.  Follow-up Follow-up in clinic in 1 month or sooner if needed.    Collaboration of Care: Collaboration of Care: Referral or follow-up with counselor/therapist AEB encouraged to continue psychotherapy  sessions.  Patient/Guardian was advised Release of Information must be obtained prior to any record release in order to collaborate their care with an outside provider. Patient/Guardian was advised if they have not already done so to contact the registration department to sign all necessary forms in order for us  to release information regarding their care.   Consent: Patient/Guardian gives verbal consent for treatment and assignment of benefits for services provided during this visit. Patient/Guardian expressed understanding and agreed to proceed.  This note was generated in part or whole with voice recognition software. Voice recognition is usually quite accurate but there are transcription errors that can and very often do occur. I apologize for any typographical errors that were not detected and corrected.     Cantrell Larouche, MD 02/12/2024, 7:52 AM     [1]  Allergies Allergen Reactions   Lovastatin  Other (See Comments)    Unknown reaction

## 2024-03-03 ENCOUNTER — Ambulatory Visit: Admitting: Professional Counselor

## 2024-03-23 ENCOUNTER — Encounter: Payer: Self-pay | Admitting: Psychiatry

## 2024-03-23 ENCOUNTER — Telehealth: Admitting: Psychiatry

## 2024-03-23 DIAGNOSIS — Z634 Disappearance and death of family member: Secondary | ICD-10-CM

## 2024-03-23 DIAGNOSIS — F09 Unspecified mental disorder due to known physiological condition: Secondary | ICD-10-CM

## 2024-03-23 DIAGNOSIS — F1290 Cannabis use, unspecified, uncomplicated: Secondary | ICD-10-CM | POA: Diagnosis not present

## 2024-03-23 DIAGNOSIS — F250 Schizoaffective disorder, bipolar type: Secondary | ICD-10-CM

## 2024-03-23 NOTE — Progress Notes (Unsigned)
 Virtual Visit via Video Note  I connected with Sandra Charles on 03/23/24 at  4:00 PM EST by a video enabled telemedicine application and verified that I am speaking with the correct person using two identifiers.  Location Provider Location : [facility] Patient Location : Home  Participants: Patient , Son,Provider    I discussed the limitations of evaluation and management by telemedicine and the availability of in person appointments. The patient expressed understanding and agreed to proceed.   I discussed the assessment and treatment plan with the patient. The patient was provided an opportunity to ask questions and all were answered. The patient agreed with the plan and demonstrated an understanding of the instructions.   The patient was advised to call back or seek an in-person evaluation if the symptoms worsen or if the condition fails to improve as anticipated.   BH MD OP Progress Note  03/23/2024 4:36 PM Sandra Charles  MRN:  [id number]  Chief Complaint:  Chief Complaint  Patient presents with   Medication Refill   Follow-up   Anxiety   mood swings   HPI: Sandra Charles is a 69 year old African-American female, currently lives in [place], on [address], has a history of schizoaffective disorder, cognitive disorder probably Lewy body dementia, hypertension, dyslipidemia, seizure disorder, prediabetes, gastroesophageal reflux disease was evaluated by telemedicine today for a follow-up appointment.  She is accompanied by her son.  Overall, she reports doing well since her last visit. She states she is getting along with others [facility], while her son notes that her enjoyment of the program varies from day to day. She denies experiencing outbursts, anxiety symptoms, hallucinations, or perceptual disturbances since her last visit. Over the past month, she describes her sleep as improved, occasionally waking in the middle of the night but returning to sleep. She reports her memory is  pretty good, and her son describes her memory as so-so. Her son notes that neurologist is following her. She currently takes Seroquel  25 mg twice daily as needed for anxiety or agitation, but her son reports she has only needed it a couple of times since the dose was increased at the last visit and has otherwise continued her regular medications.  She denies any suicidality, homicidality or perceptual disturbances.  She appeared to be alert, oriented to person place time situation.  Patient continues to have memory problems and hence majority of information obtained from son.  She continues to have good support system from her son and niece, who take turns staying with her.     Visit Diagnosis:    ICD-10-CM   1. Schizoaffective disorder, bipolar type (HCC)  F25.0     2. Bereavement  Z63.4     3. Long term current use of cannabis  F12.90     4. Cognitive disorder  F09    Probable Lewy body dementia      Past Psychiatric History: I have reviewed past psychiatric history from progress note on 01/10/2023.  Patient with multiple inpatient behavioral health admissions most recent one in 2023 at [facility].  Denies suicide attempts.  Past medication trials include Celexa , Xanax , Depakote , Lamictal .  Past Medical History:  Past Medical History:  Diagnosis Date   Anxiety    Arthritis    all over   Chronic lower back pain    Dementia (HCC)    Depression    GERD (gastroesophageal reflux disease)    Headache    weekly (07/15/2015)   Hyperlipidemia  Hypertension    Lewy body dementia (HCC)    Mini stroke    several since 05/2014 (07/15/2015)   Psychosis in elderly, without behavioral disturbance (HCC)    Seizures (HCC) dx'd 04/2015    Past Surgical History:  Procedure Laterality Date   COLONOSCOPY WITH PROPOFOL  N/A 11/20/2021   Procedure: COLONOSCOPY WITH PROPOFOL ;  Surgeon: Maryruth Ole DASEN, MD;  Location: [facility] ENDOSCOPY;  Service: Endoscopy;  Laterality: N/A;   DILATION AND  CURETTAGE OF UTERUS     ESOPHAGOGASTRODUODENOSCOPY (EGD) WITH PROPOFOL  N/A 11/20/2021   Procedure: ESOPHAGOGASTRODUODENOSCOPY (EGD) WITH PROPOFOL ;  Surgeon: Maryruth Ole DASEN, MD;  Location: [facility] ENDOSCOPY;  Service: Endoscopy;  Laterality: N/A;   FRACTURE SURGERY     LUMBAR PUNCTURE  07/14/2015   ORIF ANKLE FRACTURE Right 01/20/2021   Procedure: OPEN REDUCTION INTERNAL FIXATION (ORIF) ANKLE FRACTURE;  Surgeon: Edie Norleen PARAS, MD;  Location: [facility] ORS;  Service: Orthopedics;  Laterality: Right;   TUBAL LIGATION     VAGINAL HYSTERECTOMY      Family Psychiatric History: I have reviewed family psychiatric history from progress note on 01/10/2023.  Family History:  Family History  Problem Relation Age of Onset   Hyperlipidemia Mother    Breast cancer Mother 37   Hypertension Mother    Heart attack Mother        age 31's   Hyperlipidemia Father    Hypertension Father    Heart attack Father 71   Breast cancer Sister 32   Mental illness No known conditions     Social History: I have reviewed social history from progress note on 01/10/2023. Social History   Socioeconomic History   Marital status: Single    Spouse name: Not on file   Number of children: 1   Years of education: Not on file   Highest education level: Not on file  Occupational History   Occupation: disabled  Tobacco Use   Smoking status: Former    Current packs/day: 1.00    Average packs/day: 1 pack/day for 5.0 years (5.0 ttl pk-yrs)    Types: Cigarettes   Smokeless tobacco: Never   Tobacco comments:    quit smoking cigarettes in the early 2000s  Vaping Use   Vaping status: Never Used  Substance and Sexual Activity   Alcohol use: Not Currently    Comment: 07/15/2015 nothing in the last few years   Drug use: No   Sexual activity: Not Currently  Other Topics Concern   Not on file  Social History Narrative   Lives with significant other, has son who comes by daily.   Social Drivers of Health   Tobacco  Use: Medium Risk (03/23/2024)   Patient History    Smoking Tobacco Use: Former    Smokeless Tobacco Use: Never    Passive Exposure: Not on file  Financial Resource Strain: Low Risk  (07/29/2023)   Received from [facility]   Overall Financial Resource Strain (CARDIA)    Difficulty of Paying Living Expenses: Not hard at all  Recent Concern: Financial Resource Strain - Medium Risk (06/27/2023)   Overall Financial Resource Strain (CARDIA)    Difficulty of Paying Living Expenses: Somewhat hard  Food Insecurity: No Food Insecurity (07/29/2023)   Received from [facility]   Epic    Within the past 12 months, you worried that your food would run out before you got the money to buy more.: Never true    Within the past 12 months, the food you  bought just didn't last and you didn't have money to get more.: Never true  Transportation Needs: No Transportation Needs (07/29/2023)   Received from [facility] - Transportation    In the past 12 months, has lack of transportation kept you from medical appointments or from getting medications?: No    Lack of Transportation (Non-Medical): No  Physical Activity: Inactive (06/27/2023)   Exercise Vital Sign    Days of Exercise per Week: 0 days    Minutes of Exercise per Session: 0 min  Stress: No Stress Concern Present (06/27/2023)   [employer] of Occupational Health - Occupational Stress Questionnaire    Feeling of Stress : Not at all  Social Connections: Moderately Isolated (06/27/2023)   Social Connection and Isolation Panel    Frequency of Communication with Friends and Family: More than three times a week    Frequency of Social Gatherings with Friends and Family: More than three times a week    Attends Religious Services: More than 4 times per year    Active Member of [employer] or Organizations: No    Attends Banker Meetings: Never    Marital Status: Widowed  Depression (PHQ2-9):  Medium Risk (02/10/2024)   Depression (PHQ2-9)    PHQ-2 Score: 6  Alcohol Screen: Low Risk (07/02/2021)   Alcohol Screen    Last Alcohol Screening Score (AUDIT): 0  Housing: Low Risk  (07/29/2023)   Received from [facility]   Epic    In the last 12 months, was there a time when you were not able to pay the mortgage or rent on time?: No    In the past 12 months, how many times have you moved where you were living?: 0    At any time in the past 12 months, were you homeless or living in a shelter (including now)?: No  Utilities: Not At Risk (07/29/2023)   Received from [facility] Utilities    Threatened with loss of utilities: No  Health Literacy: Inadequate Health Literacy (06/27/2023)   B1300 Health Literacy    Frequency of need for help with medical instructions: Always    Allergies: Allergies[1]  Metabolic Disorder Labs: Lab Results  Component Value Date   HGBA1C 5.6 07/05/2021   MPG 114.02 07/05/2021   Lab Results  Component Value Date   PROLACTIN 9.4 08/10/2015   PROLACTIN 7.1 07/12/2015   Lab Results  Component Value Date   CHOL 190 07/05/2021   TRIG 100 07/05/2021   HDL 49 07/05/2021   CHOLHDL 3.9 07/05/2021   VLDL 20 07/05/2021   LDLCALC 121 (H) 07/05/2021   LDLCALC 48 08/10/2015   Lab Results  Component Value Date   TSH 0.655 11/22/2023   TSH 0.934 08/27/2020    Therapeutic Level Labs: No results found for: LITHIUM Lab Results  Component Value Date   VALPROATE <10 (L) 01/29/2022   VALPROATE <10 (L) 01/29/2022   No results found for: CBMZ  Current Medications: Current Outpatient Medications  Medication Sig Dispense Refill   cyanocobalamin  (VITAMIN B12) 1000 MCG tablet Take 1,000 mcg by mouth daily.     amLODipine  (NORVASC ) 10 MG tablet Take 10 mg by mouth daily. For high blood pressure     ASPIRIN  LOW DOSE 81 MG tablet Take 81 mg by mouth daily.     celecoxib (CELEBREX) 200 MG capsule Take 200 mg by mouth.      cholecalciferol  (VITAMIN D3) 25  MCG (1000 UNIT) tablet Take 1,000 Units by mouth daily.     cloBAZam  (ONFI ) 10 MG tablet Take 0.5 tablets (5 mg total) by mouth at bedtime. 15 tablet 0   donepezil  (ARICEPT ) 10 MG tablet Take 10 mg by mouth at bedtime.     gabapentin  (NEURONTIN ) 300 MG capsule Take 1 capsule (300 mg total) by mouth 2 (two) times daily. Prescribed by neurology     hydrochlorothiazide  (HYDRODIURIL ) 12.5 MG tablet Take 12.5 mg by mouth.     lamoTRIgine  (LAMICTAL ) 100 MG tablet Take 1 tablet (100 mg total) by mouth 2 (two) times daily. Along with 25 mg tablet for total of 125 mg bid. 60 tablet 0   lamoTRIgine  (LAMICTAL ) 25 MG tablet Take 1 tablet (25 mg total) by mouth 2 (two) times daily. Along with 100 mg tablet for total of 125 mg bid. 60 tablet 0   losartan (COZAAR) 25 MG tablet Take 25 mg by mouth daily.     oxyCODONE -acetaminophen  (PERCOCET) 5-325 MG tablet Take 1 tablet by mouth every 4 (four) hours as needed for severe pain (pain score 7-10). 30 tablet 0   pantoprazole  (PROTONIX ) 40 MG tablet Take 1 tablet (40 mg total) by mouth daily. 30 tablet 11   propranolol  ER (INDERAL  LA) 60 MG 24 hr capsule Take 60 mg by mouth daily.  11   QUEtiapine  (SEROQUEL ) 25 MG tablet Take 1 tablet (25 mg total) by mouth 2 (two) times daily as needed. For agitation and anxiety ( DOSE INCREASE) 60 tablet 1   QUEtiapine  (SEROQUEL ) 50 MG tablet Take 1 tablet (50 mg total) by mouth at bedtime. CURRENTLY ON XR  ( Last sent 12/12/2023)     rosuvastatin  (CRESTOR ) 20 MG tablet Take 20 mg by mouth daily.     Semaglutide ,0.25 or 0.5MG /DOS, 2 MG/3ML SOPN Inject into the skin.     sertraline  (ZOLOFT ) 100 MG tablet Take 1.5 tablets (150 mg total) by mouth daily. 45 tablet 5   valACYclovir (VALTREX) 1000 MG tablet Take 1,000 mg by mouth.     No current facility-administered medications for this visit.     Musculoskeletal: Strength & Muscle Tone: UTA Gait & Station: Seated Patient leans:  N/A  Psychiatric Specialty Exam: Review of Systems  Unable to perform ROS: Psychiatric disorder    There were no vitals taken for this visit.There is no height or weight on file to calculate BMI.  General Appearance: Casual  Eye Contact:  Good  Speech:  Clear and Coherent  Volume:  Normal  Mood:  Mood swings-improving  Affect:  Appropriate  Thought Process:  Goal Directed and Descriptions of Associations: Intact  Orientation:  Full (Time, Place, and Person)  Thought Content: Logical   Suicidal Thoughts:  No  Homicidal Thoughts:  No  Memory:  Immediate;   Fair Recent;   Poor Remote;   Poor  Judgement:  Fair  Insight:  Fair  Psychomotor Activity:  Normal  Concentration:  Concentration: Fair and Attention Span: Fair  Recall:  Poor  Fund of Knowledge: Fair  Language: Fair  Akathisia:  No  Handed:  Left  AIMS (if indicated): not done  Assets:  Desire for Improvement Housing Social Support Transportation  ADL's:  Intact  Cognition: Limited  Sleep:  Fair   Screenings: Geneticist, Molecular Office Visit from 12/12/2023 in [facility] Office Visit from 09/19/2023 in [facility] Admission (Discharged) from 08/10/2015 in [facility]IENT BE[facility]AVIORAL MEDICINE  AIMS Total Score 0 0 0   AUDIT    Flowsheet Row Admission (Discharged) from 07/02/2021 in [facility] Admission (Discharged) from 08/10/2015 in [facility] INPATIENT BEHAVIORAL MEDICINE  Alcohol Use Disorder Identification Test Final Score (AUDIT) 0 0   GAD-7    Flowsheet Row Office Visit from 12/12/2023 in [facility] Counselor from 11/27/2023 in [facility] Office Visit from 09/19/2023 in [facility] Counselor from 06/27/2023 in [facility] Office Visit from 06/26/2023 in  [facility]  Total GAD-7 Score 7 0 6 3 3    PHQ2-9    Flowsheet Row Office Visit from 02/10/2024 in [facility] Office Visit from 12/12/2023 in [facility] Counselor from 11/27/2023 in [facility] Office Visit from 09/19/2023 in [facility] Counselor from 06/27/2023 in [facility]  PHQ-2 Total Score 1 3 1 2 1   PHQ-9 Total Score 6 7 3 5 4    Flowsheet Row Video Visit from 03/23/2024 in [facility] Office Visit from 02/10/2024 in [facility] Office Visit from 12/12/2023 in [facility]  C-SSRS RISK CATEGORY No Risk No Risk No Risk     Assessment and Plan: CAMISHA SREY is a 69 year old African-American female who presented for a follow-up appointment, discussed assessment and plan as noted below.  1. Schizoaffective disorder, bipolar type (HCC)-improving Currently reports overall improvement on the current medication regimen Continue Seroquel  XR 50 mg at bedtime Continue Sertraline  150 mg daily Continue Seroquel  25 mg twice a day as needed for severe anxiety/agitation.  2. Bereavement-improving Reports good improvement regarding grief. Continue psychotherapy sessions with Ms. Almarie Ligas as needed  3. Long term current use of cannabis-unstable Not interested in quitting.  4. Cognitive disorder likely Lewy body dementia-chronic Currently under the care of neurology Continues to attend [facility].  Collateral information obtained from son Penne who was present in session today.  Follow-up Follow-up in clinic in 3 months or sooner in person.   Consent: Patient/Guardian gives verbal consent for treatment and assignment of  benefits for services provided during this visit. Patient/Guardian expressed understanding and agreed to proceed.  Discussed the use of a AI scribe software for clinical note transcription with the patient, who gave verbal consent to proceed. This note was generated in part or whole with voice recognition software. Voice recognition is usually quite accurate but there are transcription errors that can and very often do occur. I apologize for any typographical errors that were not detected and corrected.      Jayln Branscom, MD 03/24/2024, 10:09 AM     [1]  Allergies Allergen Reactions   Lovastatin  Other (See Comments)    Unknown reaction

## 2024-06-08 ENCOUNTER — Ambulatory Visit: Admitting: Psychiatry
